# Patient Record
Sex: Female | Born: 1950 | Race: White | Hispanic: No | Smoking: Former smoker
Health system: Southern US, Community
[De-identification: ages and names within clinical notes are randomized; demographics above are authoritative.]

## PROBLEM LIST (undated history)

## (undated) DIAGNOSIS — R7303 Prediabetes: Secondary | ICD-10-CM

## (undated) DIAGNOSIS — F4310 Post-traumatic stress disorder, unspecified: Secondary | ICD-10-CM

## (undated) DIAGNOSIS — F419 Anxiety disorder, unspecified: Secondary | ICD-10-CM

## (undated) DIAGNOSIS — J449 Chronic obstructive pulmonary disease, unspecified: Secondary | ICD-10-CM

## (undated) DIAGNOSIS — M8700 Idiopathic aseptic necrosis of unspecified bone: Secondary | ICD-10-CM

## (undated) DIAGNOSIS — F319 Bipolar disorder, unspecified: Secondary | ICD-10-CM

## (undated) DIAGNOSIS — Z9989 Dependence on other enabling machines and devices: Secondary | ICD-10-CM

## (undated) DIAGNOSIS — F32A Depression, unspecified: Secondary | ICD-10-CM

## (undated) DIAGNOSIS — J189 Pneumonia, unspecified organism: Secondary | ICD-10-CM

## (undated) DIAGNOSIS — M1990 Unspecified osteoarthritis, unspecified site: Secondary | ICD-10-CM

## (undated) DIAGNOSIS — J40 Bronchitis, not specified as acute or chronic: Secondary | ICD-10-CM

## (undated) DIAGNOSIS — R0600 Dyspnea, unspecified: Secondary | ICD-10-CM

## (undated) HISTORY — PX: HAND SURGERY: SHX662

## (undated) HISTORY — PX: TONSILLECTOMY: SUR1361

## (undated) HISTORY — PX: OTHER SURGICAL HISTORY: SHX169

## (undated) HISTORY — PX: COLONOSCOPY: SHX174

## (undated) HISTORY — PX: TONSILLECTOMY AND ADENOIDECTOMY: SHX28

## (undated) HISTORY — PX: CATARACT EXTRACTION: SUR2

## (undated) HISTORY — PX: JOINT REPLACEMENT: SHX530

---

## 2017-04-13 ENCOUNTER — Emergency Department (HOSPITAL_BASED_OUTPATIENT_CLINIC_OR_DEPARTMENT_OTHER): Payer: Medicare Other

## 2017-04-13 ENCOUNTER — Inpatient Hospital Stay (HOSPITAL_BASED_OUTPATIENT_CLINIC_OR_DEPARTMENT_OTHER)
Admission: EM | Admit: 2017-04-13 | Discharge: 2017-04-22 | DRG: 189 | Disposition: A | Discharge status: Home Health | Payer: Medicare Other | Attending: Internal Medicine | Admitting: Internal Medicine

## 2017-04-13 ENCOUNTER — Encounter (HOSPITAL_BASED_OUTPATIENT_CLINIC_OR_DEPARTMENT_OTHER): Payer: Self-pay | Admitting: *Deleted

## 2017-04-13 DIAGNOSIS — J9621 Acute and chronic respiratory failure with hypoxia: Secondary | ICD-10-CM | POA: Diagnosis present

## 2017-04-13 DIAGNOSIS — F419 Anxiety disorder, unspecified: Secondary | ICD-10-CM | POA: Diagnosis present

## 2017-04-13 DIAGNOSIS — B370 Candidal stomatitis: Secondary | ICD-10-CM | POA: Diagnosis not present

## 2017-04-13 DIAGNOSIS — F411 Generalized anxiety disorder: Secondary | ICD-10-CM | POA: Diagnosis present

## 2017-04-13 DIAGNOSIS — F319 Bipolar disorder, unspecified: Secondary | ICD-10-CM | POA: Diagnosis present

## 2017-04-13 DIAGNOSIS — R0902 Hypoxemia: Secondary | ICD-10-CM

## 2017-04-13 DIAGNOSIS — T380X5A Adverse effect of glucocorticoids and synthetic analogues, initial encounter: Secondary | ICD-10-CM | POA: Diagnosis present

## 2017-04-13 DIAGNOSIS — Z79899 Other long term (current) drug therapy: Secondary | ICD-10-CM | POA: Diagnosis not present

## 2017-04-13 DIAGNOSIS — J441 Chronic obstructive pulmonary disease with (acute) exacerbation: Secondary | ICD-10-CM | POA: Diagnosis present

## 2017-04-13 DIAGNOSIS — R739 Hyperglycemia, unspecified: Secondary | ICD-10-CM | POA: Diagnosis present

## 2017-04-13 DIAGNOSIS — F4310 Post-traumatic stress disorder, unspecified: Secondary | ICD-10-CM | POA: Diagnosis present

## 2017-04-13 DIAGNOSIS — D72829 Elevated white blood cell count, unspecified: Secondary | ICD-10-CM | POA: Diagnosis present

## 2017-04-13 DIAGNOSIS — F17210 Nicotine dependence, cigarettes, uncomplicated: Secondary | ICD-10-CM | POA: Diagnosis present

## 2017-04-13 DIAGNOSIS — Z7951 Long term (current) use of inhaled steroids: Secondary | ICD-10-CM

## 2017-04-13 HISTORY — DX: Post-traumatic stress disorder, unspecified: F43.10

## 2017-04-13 HISTORY — DX: Chronic obstructive pulmonary disease, unspecified: J44.9

## 2017-04-13 HISTORY — DX: Bipolar disorder, unspecified: F31.9

## 2017-04-13 LAB — BASIC METABOLIC PANEL
Anion gap: 13 (ref 5–15)
BUN: 16 mg/dL (ref 6–20)
CO2: 22 mmol/L (ref 22–32)
Calcium: 9.5 mg/dL (ref 8.9–10.3)
Chloride: 103 mmol/L (ref 101–111)
Creatinine, Ser: 1.09 mg/dL — ABNORMAL HIGH (ref 0.44–1.00)
GFR calc Af Amer: >60 mL/min (ref >60)
GFR calc non Af Amer: 52 mL/min — ABNORMAL LOW (ref >60)
Glucose, Bld: 146 mg/dL — ABNORMAL HIGH (ref 65–99)
Potassium: 3.9 mmol/L (ref 3.5–5.1)
Sodium: 138 mmol/L (ref 135–145)

## 2017-04-13 LAB — CBC WITH DIFFERENTIAL/PLATELET
Basophils Absolute: 0 10*3/uL (ref 0.0–0.1)
Basophils Relative: 0 %
Eosinophils Absolute: 0 10*3/uL (ref 0.0–0.7)
Eosinophils Relative: 0 %
HCT: 47 % — ABNORMAL HIGH (ref 36.0–46.0)
Hemoglobin: 15.9 g/dL — ABNORMAL HIGH (ref 12.0–15.0)
Lymphocytes Relative: 17 %
Lymphs Abs: 1.5 10*3/uL (ref 0.7–4.0)
MCH: 30.6 pg (ref 26.0–34.0)
MCHC: 33.8 g/dL (ref 30.0–36.0)
MCV: 90.6 fL (ref 78.0–100.0)
Monocytes Absolute: 0.4 10*3/uL (ref 0.1–1.0)
Monocytes Relative: 5 %
Neutro Abs: 7 10*3/uL (ref 1.7–7.7)
Neutrophils Relative %: 78 %
Platelets: 256 10*3/uL (ref 150–400)
RBC: 5.19 MIL/uL — ABNORMAL HIGH (ref 3.87–5.11)
RDW: 13.3 % (ref 11.5–15.5)
WBC: 8.9 10*3/uL (ref 4.0–10.5)

## 2017-04-13 LAB — TROPONIN I: Troponin I: <0.03 ng/mL (ref <0.03)

## 2017-04-13 IMAGING — CR DG CHEST 2V
2 series · 2 of 2 positions shown · non-contrast
Comparison: None.

CLINICAL DATA: Shortness of breath and cough

EXAM:
CHEST  2 VIEW

[w chest pa]
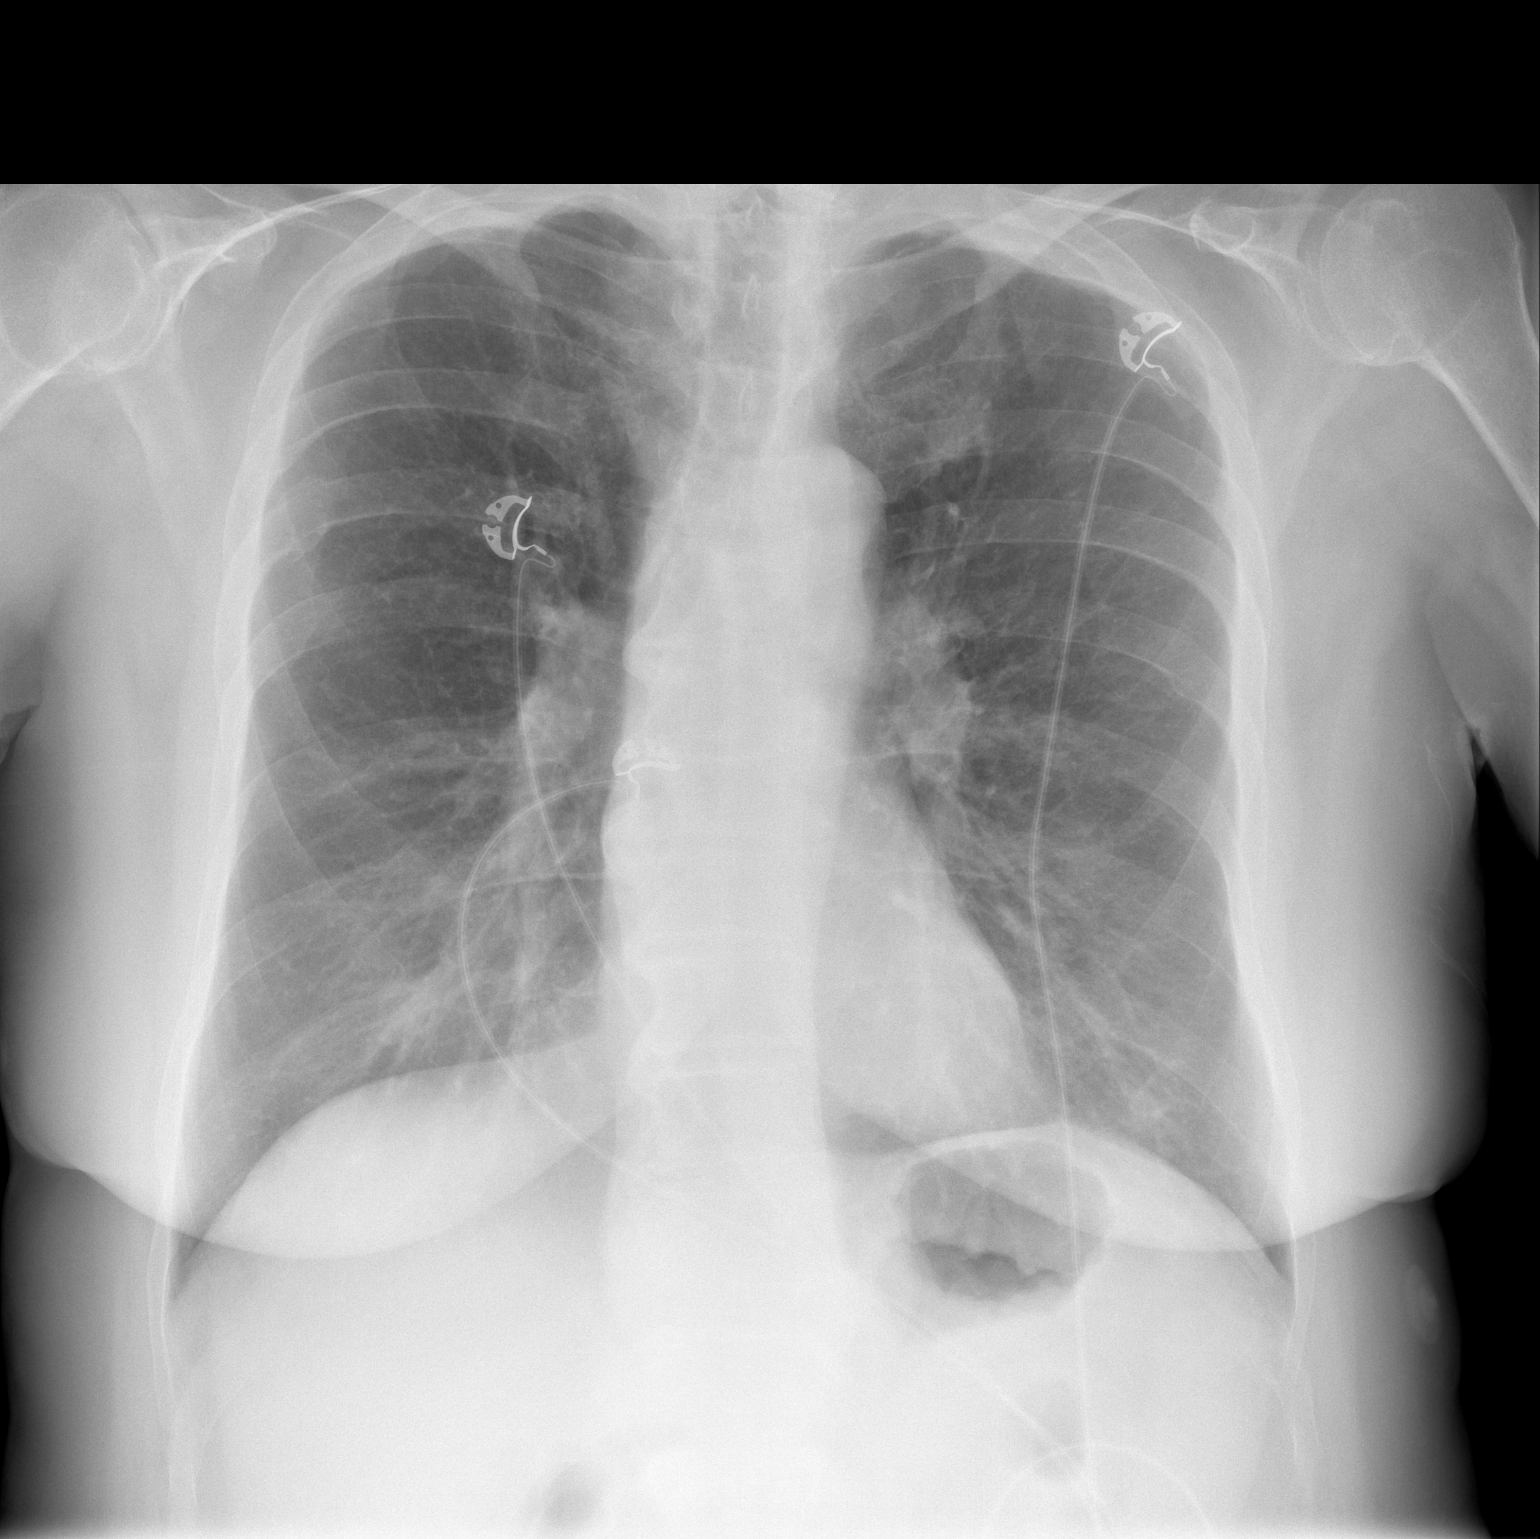

[w chest lat]
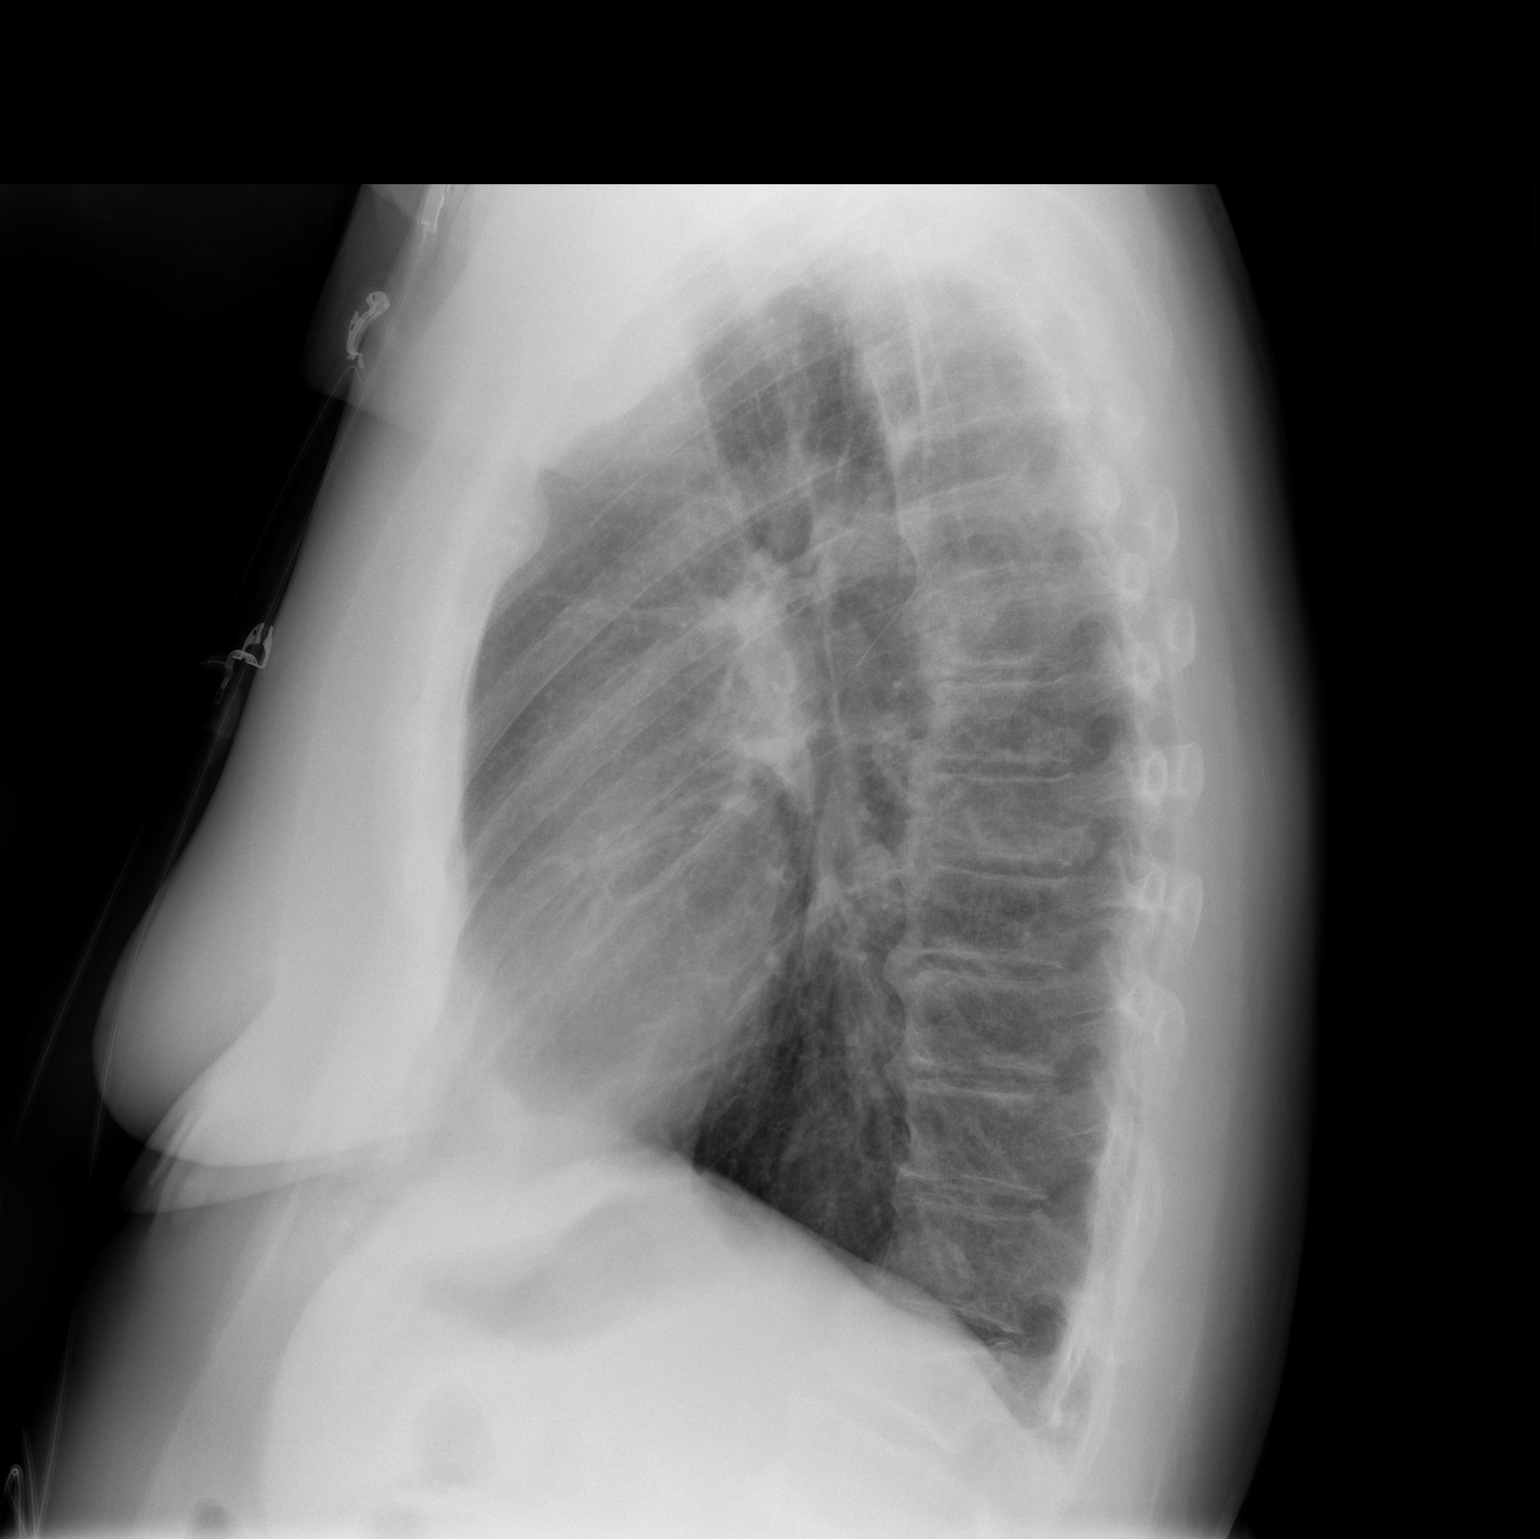

[2 of 2 positions shown; findings below may reference images not displayed]

FINDINGS: There is no edema or consolidation. The heart size and pulmonary
vascularity are normal. No adenopathy. There is degenerative change
in the thoracic spine.
IMPRESSION: No edema or consolidation.

## 2017-04-13 MED ORDER — IPRATROPIUM-ALBUTEROL 0.5-2.5 (3) MG/3ML IN SOLN
3.0000 mL | Freq: Four times a day (QID) | RESPIRATORY_TRACT | Status: DC
  Administered 2017-04-13 – 2017-04-17 (×17): 3 mL via RESPIRATORY_TRACT
  Filled 2017-04-13 (×17): qty 3

## 2017-04-13 MED ORDER — METHYLPREDNISOLONE SODIUM SUCC 125 MG IJ SOLR
125.0000 mg | Freq: Once | INTRAMUSCULAR | Status: AC
  Administered 2017-04-13: 125 mg via INTRAVENOUS
  Filled 2017-04-13: qty 2

## 2017-04-13 MED ORDER — SODIUM CHLORIDE 0.9 % IV BOLUS (SEPSIS)
1000.0000 mL | Freq: Once | INTRAVENOUS | Status: AC
  Administered 2017-04-13: 1000 mL via INTRAVENOUS

## 2017-04-13 MED ORDER — ALBUTEROL SULFATE (2.5 MG/3ML) 0.083% IN NEBU
5.0000 mg | INHALATION_SOLUTION | Freq: Once | RESPIRATORY_TRACT | Status: AC
  Administered 2017-04-13: 5 mg via RESPIRATORY_TRACT
  Filled 2017-04-13: qty 6

## 2017-04-13 MED ORDER — ALBUTEROL (5 MG/ML) CONTINUOUS INHALATION SOLN
10.0000 mg/h | INHALATION_SOLUTION | RESPIRATORY_TRACT | Status: DC
  Administered 2017-04-13: 10 mg/h via RESPIRATORY_TRACT
  Filled 2017-04-13: qty 20

## 2017-04-13 MED ORDER — LORAZEPAM 2 MG/ML IJ SOLN
0.5000 mg | Freq: Once | INTRAMUSCULAR | Status: AC
  Administered 2017-04-13: 0.5 mg via INTRAVENOUS
  Filled 2017-04-13: qty 1

## 2017-04-13 MED ORDER — MAGNESIUM SULFATE 2 GM/50ML IV SOLN
2.0000 g | Freq: Once | INTRAVENOUS | Status: AC
  Administered 2017-04-13: 2 g via INTRAVENOUS
  Filled 2017-04-13: qty 50

## 2017-04-13 MED ORDER — IPRATROPIUM BROMIDE 0.02 % IN SOLN
0.5000 mg | Freq: Once | RESPIRATORY_TRACT | Status: AC
  Administered 2017-04-13: 0.5 mg via RESPIRATORY_TRACT
  Filled 2017-04-13: qty 2.5

## 2017-04-13 MED ORDER — IPRATROPIUM-ALBUTEROL 0.5-2.5 (3) MG/3ML IN SOLN: 3.0000 mL | Freq: Four times a day (QID) | RESPIRATORY_TRACT | Status: DC

## 2017-04-13 NOTE — Progress Notes (Signed)
[facility].  Patient with known h/o COPD and tobacco dependence presenting with COPD exacerbation with failure of outpatient antibiotics (Augmentin and prednisone).  They are requesting admission to SDU.   Home pulse ox measured 78%; 87% on room air on presentation with accessory muscle use and increased WOB.  Feeling some better but placed on BIPAP with increased WOB and anxiety with good response.  EKG with sinus tach, CXR negative.  Will admit to SDU and continue BIPAP for now.  Georgana CurioJennifer E. Meiko Ives, M.D.

## 2017-04-13 NOTE — ED Notes (Signed)
Pt on cardiac monitor and auto VS 

## 2017-04-13 NOTE — ED Notes (Signed)
Pt able to stand and pivot from wheelchair to lowered bed x1 min assist. RT and RN at bedside.

## 2017-04-13 NOTE — ED Triage Notes (Signed)
Patient states she has a five history of SOB.  Has been seen by her PCP three days ago and was treated Albuterol inhaler, prednisone and amoxicillin for an Exacerbation of COPD.   States she has only gotten worse and today is the worse day so far.  Pulse ox at home was in the 80"s.

## 2017-04-13 NOTE — ED Provider Notes (Signed)
[facility] Provider Note   CSN: [id number] Arrival date & time: 04/13/17  1644  By signing my name below, I, Judy [name], attest that this documentation has been prepared under the direction and in the presence of Judy Levandowski, PA-C. Electronically Signed: Linna [name], Scribe. 04/13/2017. 5:24 PM.  History   Chief Complaint Chief Complaint  Patient presents with  . Shortness of Breath   The history is provided by the patient. No language interpreter was used.    HPI Comments: Judy [name] is a 66 y.o. female with PMHx of COPD who presents to the Emergency Department complaining of persistent, gradually worsening dyspnea for five days. She notes some associated subjective fevers and also notes an intermittent dry cough for one week. Patient was evaluated by her PCP three days ago for the same and was prescribed prednisone, amoxicillin, and an albuterol inhaler. She has been using the inhaler every 4-6 hours without relief. She did not have a chest x-ray taken during the evaluation with her PCP. Patient also has albuterol nebulizer treatments at home but has instead been using the inhalers over the last three days. She notes that her oxygen saturation was in the 80's prior to arrival today. Patient does report that her breathing has improved here with No noted cardiac history. Patient denies chest pain or any other associated symptoms.  Past Medical History:  Diagnosis Date  . Bipolar 1 disorder (HCC)   . COPD (chronic obstructive pulmonary disease) (HCC)     There are no active problems to display for this patient.   Past Surgical History:  Procedure Laterality Date  . bone spurs foot      OB History    No data available       Home Medications    Prior to Admission medications   Medication Sig Start Date End Date Taking? Authorizing Provider  ALPRAZolam Prudy Feeler) 0.5 MG tablet Take 0.5 mg by mouth 2 (two) times daily.   Yes [provider]    amoxicillin-clavulanate (AUGMENTIN) 875-125 MG tablet Take 1 tablet by mouth 2 (two) times daily.   Yes [provider]  atorvastatin (LIPITOR) 40 MG tablet Take 40 mg by mouth daily.   Yes [provider]  benztropine (COGENTIN) 2 MG tablet Take 2 mg by mouth 2 (two) times daily.   Yes [provider]  eszopiclone (LUNESTA) 2 MG TABS tablet Take 3 mg by mouth at bedtime as needed for sleep. Take immediately before bedtime   Yes [provider]  ibuprofen (ADVIL,MOTRIN) 800 MG tablet Take 800 mg by mouth every 8 (eight) hours as needed.   Yes [provider]  lurasidone (LATUDA) 80 MG TABS tablet Take 80 mg by mouth daily with breakfast.   Yes [provider]  PREDNISONE PO Take by mouth.   Yes [provider]  Vilazodone HCl (VIIBRYD) 40 MG TABS Take by mouth daily.   Yes [provider]    Family History No family history on file.  Social History Social History  Substance Use Topics  . Smoking status: Current Every Day Smoker    Packs/day: 0.50    Types: Cigarettes  . Smokeless tobacco: Never Used  . Alcohol use Yes     Comment: occassionaly     Allergies   Patient has no known allergies.   Review of Systems Review of Systems  Constitutional: Positive for fever (subjective).  Respiratory: Positive for cough and shortness of breath.   Cardiovascular: Negative for chest  pain.   Physical Exam Updated Vital Signs BP (!) 137/91 (BP Location: Left Arm)   Pulse (!) 127   Temp 99.1 F (37.3 C) (Oral)   Resp (!) 28   Ht 5\' 10"  (1.778 m)   Wt 189 lb (85.7 kg)   SpO2 (!) 87%   BMI 27.12 kg/m   Physical Exam  Constitutional: She is oriented to person, place, and time. She appears well-developed and well-nourished. No distress.  HENT:  Head: Normocephalic and atraumatic.  Eyes: Conjunctivae and EOM are normal.  Neck: Neck supple. No tracheal deviation present.  Cardiovascular: Regular rhythm and  normal heart sounds.   tachycardic  Pulmonary/Chest: She has wheezes.  Inspiratory and expiratory wheezing bilaterally. Decreased air movement. Tacheipnea. Accessory muscle use. Speaking 3 word sentences.   Musculoskeletal: Normal range of motion.  Neurological: She is alert and oriented to person, place, and time.  Skin: Skin is warm and dry.  Psychiatric: She has a normal mood and affect. Her behavior is normal.  Nursing note and vitals reviewed.  ED Treatments / Results  Labs (all labs ordered are listed, but only abnormal results are displayed) Labs Reviewed  CBC WITH DIFFERENTIAL/PLATELET  BASIC METABOLIC PANEL  TROPONIN I    EKG  EKG Interpretation None       Radiology No results found.  Procedures Procedures (including critical care time)  DIAGNOSTIC STUDIES: Oxygen Saturation is 87% on RA, low by my interpretation.    COORDINATION OF CARE: 5:14 PM Discussed treatment plan with pt at bedside and pt agreed to plan.  Medications Ordered in ED Medications  methylPREDNISolone sodium succinate (SOLU-MEDROL) 125 mg/2 mL injection 125 mg (not administered)  ipratropium-albuterol (DUONEB) 0.5-2.5 (3) MG/3ML nebulizer solution 3 mL (not administered)  sodium chloride 0.9 % bolus 1,000 mL (not administered)  albuterol (PROVENTIL) (2.5 MG/3ML) 0.083% nebulizer solution 5 mg (5 mg Nebulization Given 04/13/17 1710)  ipratropium (ATROVENT) nebulizer solution 0.5 mg (0.5 mg Nebulization Given 04/13/17 1709)     Initial Impression / Assessment and Plan / ED Course  I have reviewed the triage vital signs and the nursing notes.  Pertinent labs & imaging results that were available during my care of the patient were reviewed by me and considered in my medical decision making (see chart for details).    Patient emergency department with cough, shortness of breath, wheezing. Started on Augmentin, prednisone, albuterol inhaler 3 days ago by PCP, worsening. Patient states her oxygen  saturation at home was 78%. Here oxygen saturation to 87 on room air. Started on IV breathing treatment. Patient is wheezing. Will administer Solu-Medrol, get labs and chest x-ray.  6:08 PM Patient states she feels slightly better, however continues to have wheezing and increased respiratory rate with some accessory muscle use. Placed on continuous neb. Will add magnesium.  8:30 PM Pt very anxious, continues to work hard breathing. Discussed with Dr. Particia NearingHaviland, will start on bipap. Ativan given for anxiety. Had to stop neb bc pt was very jittery and shaking, very anxious. Will admit Spoke with [facility], accepted to step down, Dr. Ophelia CharterYates. Pt is doing better on bipap. BP remaining normal. HR remaining slightly elevated. Oxygen in high 90s on bipap.    10:17 PM [facility] here to transport pt. We did tried to get her off bipap but she was unable to tolerate. Placed back on bipap. Will be transproted to [facility].   Vitals:   04/13/17 2000 04/13/17 2030 04/13/17 2035 04/13/17 2100  BP: (!) 146/98 (!) 145/84  131/84  Pulse: (!) 103 (!) 102  (!) 109  Resp: 17 18  (!) 23  Temp:      TempSrc:      SpO2: 97% 96% 95% 95%  Weight:      Height:          Final Clinical Impressions(s) / ED Diagnoses   Final diagnoses:  COPD exacerbation (HCC)    New Prescriptions New Prescriptions   No medications on file   I personally performed the services described in this documentation, which was scribed in my presence. The recorded information has been reviewed and is accurate.    Jaynie Crumble, PA-C 04/13/17 2219    Jacalyn Lefevre, MD 04/13/17 2311

## 2017-04-14 ENCOUNTER — Encounter (HOSPITAL_COMMUNITY): Payer: Self-pay | Admitting: Internal Medicine

## 2017-04-14 DIAGNOSIS — R739 Hyperglycemia, unspecified: Secondary | ICD-10-CM

## 2017-04-14 DIAGNOSIS — J441 Chronic obstructive pulmonary disease with (acute) exacerbation: Secondary | ICD-10-CM | POA: Diagnosis present

## 2017-04-14 LAB — CBC
HCT: 39.1 % (ref 36.0–46.0)
Hemoglobin: 13.1 g/dL (ref 12.0–15.0)
MCH: 30.1 pg (ref 26.0–34.0)
MCHC: 33.5 g/dL (ref 30.0–36.0)
MCV: 89.9 fL (ref 78.0–100.0)
Platelets: 203 10*3/uL (ref 150–400)
RBC: 4.35 MIL/uL (ref 3.87–5.11)
RDW: 13.2 % (ref 11.5–15.5)
WBC: 12 10*3/uL — ABNORMAL HIGH (ref 4.0–10.5)

## 2017-04-14 LAB — BASIC METABOLIC PANEL
Anion gap: 13 (ref 5–15)
BUN: 17 mg/dL (ref 6–20)
CO2: 22 mmol/L (ref 22–32)
Calcium: 9.3 mg/dL (ref 8.9–10.3)
Chloride: 104 mmol/L (ref 101–111)
Creatinine, Ser: 1.06 mg/dL — ABNORMAL HIGH (ref 0.44–1.00)
GFR calc Af Amer: >60 mL/min (ref >60)
GFR calc non Af Amer: 54 mL/min — ABNORMAL LOW (ref >60)
Glucose, Bld: 219 mg/dL — ABNORMAL HIGH (ref 65–99)
Potassium: 4 mmol/L (ref 3.5–5.1)
Sodium: 139 mmol/L (ref 135–145)

## 2017-04-14 LAB — GLUCOSE, CAPILLARY
Glucose-Capillary: 172 mg/dL — ABNORMAL HIGH (ref 65–99)
Glucose-Capillary: 179 mg/dL — ABNORMAL HIGH (ref 65–99)
Glucose-Capillary: 194 mg/dL — ABNORMAL HIGH (ref 65–99)
Glucose-Capillary: 168 mg/dL — ABNORMAL HIGH (ref 65–99)

## 2017-04-14 LAB — MRSA PCR SCREENING: MRSA by PCR: NEGATIVE

## 2017-04-14 LAB — HIV ANTIBODY (ROUTINE TESTING W REFLEX): HIV Screen 4th Generation wRfx: NONREACTIVE

## 2017-04-14 MED ORDER — INSULIN ASPART 100 UNIT/ML ~~LOC~~ SOLN
0.0000 [IU] | Freq: Three times a day (TID) | SUBCUTANEOUS | Status: DC
  Administered 2017-04-14 (×3): 3 [IU] via SUBCUTANEOUS
  Administered 2017-04-15: 2 [IU] via SUBCUTANEOUS
  Administered 2017-04-15 – 2017-04-16 (×3): 3 [IU] via SUBCUTANEOUS
  Administered 2017-04-16: 2 [IU] via SUBCUTANEOUS
  Administered 2017-04-16: 5 [IU] via SUBCUTANEOUS
  Administered 2017-04-17: 3 [IU] via SUBCUTANEOUS
  Administered 2017-04-17 (×2): 2 [IU] via SUBCUTANEOUS
  Administered 2017-04-18 (×2): 3 [IU] via SUBCUTANEOUS
  Administered 2017-04-18: 2 [IU] via SUBCUTANEOUS
  Administered 2017-04-19 (×2): 3 [IU] via SUBCUTANEOUS
  Administered 2017-04-19: 5 [IU] via SUBCUTANEOUS
  Administered 2017-04-20: 11 [IU] via SUBCUTANEOUS
  Administered 2017-04-20 (×2): 3 [IU] via SUBCUTANEOUS
  Administered 2017-04-21: 2 [IU] via SUBCUTANEOUS
  Administered 2017-04-21 (×2): 3 [IU] via SUBCUTANEOUS
  Administered 2017-04-22: 2 [IU] via SUBCUTANEOUS

## 2017-04-14 MED ORDER — ALPRAZOLAM 0.5 MG PO TABS
0.5000 mg | ORAL_TABLET | Freq: Two times a day (BID) | ORAL | Status: DC | PRN
  Administered 2017-04-14 – 2017-04-16 (×3): 0.5 mg via ORAL
  Filled 2017-04-14 (×3): qty 1

## 2017-04-14 MED ORDER — ENOXAPARIN SODIUM 40 MG/0.4ML ~~LOC~~ SOLN
40.0000 mg | Freq: Every day | SUBCUTANEOUS | Status: DC
  Administered 2017-04-14 – 2017-04-21 (×7): 40 mg via SUBCUTANEOUS
  Filled 2017-04-14 (×9): qty 0.4

## 2017-04-14 MED ORDER — CHLORHEXIDINE GLUCONATE 0.12 % MT SOLN: 15.0000 mL | Freq: Two times a day (BID) | OROMUCOSAL | Status: DC

## 2017-04-14 MED ORDER — METHYLPREDNISOLONE SODIUM SUCC 125 MG IJ SOLR
80.0000 mg | Freq: Two times a day (BID) | INTRAMUSCULAR | Status: DC
  Administered 2017-04-14 – 2017-04-17 (×8): 80 mg via INTRAVENOUS
  Filled 2017-04-14 (×9): qty 2

## 2017-04-14 MED ORDER — BENZTROPINE MESYLATE 0.5 MG PO TABS
2.0000 mg | ORAL_TABLET | Freq: Two times a day (BID) | ORAL | Status: DC
  Administered 2017-04-14 – 2017-04-22 (×18): 2 mg via ORAL
  Filled 2017-04-14 (×18): qty 4

## 2017-04-14 MED ORDER — LURASIDONE HCL 40 MG PO TABS
80.0000 mg | ORAL_TABLET | Freq: Every day | ORAL | Status: DC
  Administered 2017-04-14 – 2017-04-21 (×9): 80 mg via ORAL
  Filled 2017-04-14 (×9): qty 2

## 2017-04-14 MED ORDER — ONDANSETRON HCL 4 MG PO TABS: 4.0000 mg | ORAL_TABLET | Freq: Four times a day (QID) | ORAL | Status: DC | PRN

## 2017-04-14 MED ORDER — ALBUTEROL SULFATE (2.5 MG/3ML) 0.083% IN NEBU
2.5000 mg | INHALATION_SOLUTION | RESPIRATORY_TRACT | Status: DC | PRN
Start: 2017-04-14 — End: 2017-04-22

## 2017-04-14 MED ORDER — ORAL CARE MOUTH RINSE
15.0000 mL | Freq: Two times a day (BID) | OROMUCOSAL | Status: DC
  Administered 2017-04-14 – 2017-04-16 (×4): 15 mL via OROMUCOSAL

## 2017-04-14 MED ORDER — LACTATED RINGERS IV SOLN
INTRAVENOUS | Status: DC
  Administered 2017-04-14 – 2017-04-15 (×4): via INTRAVENOUS

## 2017-04-14 MED ORDER — ACETAMINOPHEN 650 MG RE SUPP: 650.0000 mg | Freq: Four times a day (QID) | RECTAL | Status: DC | PRN

## 2017-04-14 MED ORDER — VILAZODONE HCL 40 MG PO TABS
40.0000 mg | ORAL_TABLET | Freq: Every day | ORAL | Status: DC
  Administered 2017-04-14 – 2017-04-15 (×2): 40 mg via ORAL
  Filled 2017-04-14 (×5): qty 1

## 2017-04-14 MED ORDER — DOCUSATE SODIUM 100 MG PO CAPS
100.0000 mg | ORAL_CAPSULE | Freq: Two times a day (BID) | ORAL | Status: DC
  Administered 2017-04-14 – 2017-04-21 (×13): 100 mg via ORAL
  Filled 2017-04-14 (×14): qty 1

## 2017-04-14 MED ORDER — SODIUM CHLORIDE 0.9% FLUSH
3.0000 mL | Freq: Two times a day (BID) | INTRAVENOUS | Status: DC
  Administered 2017-04-14 (×2): 3 mL via INTRAVENOUS
  Administered 2017-04-15: 10 mL via INTRAVENOUS
  Administered 2017-04-16 – 2017-04-19 (×6): 3 mL via INTRAVENOUS

## 2017-04-14 MED ORDER — CYCLOBENZAPRINE HCL 10 MG PO TABS
10.0000 mg | ORAL_TABLET | Freq: Three times a day (TID) | ORAL | Status: DC | PRN
  Administered 2017-04-21: 10 mg via ORAL
  Filled 2017-04-14: qty 1

## 2017-04-14 MED ORDER — ZOLPIDEM TARTRATE 5 MG PO TABS
5.0000 mg | ORAL_TABLET | Freq: Every day | ORAL | Status: DC
  Administered 2017-04-14 – 2017-04-21 (×9): 5 mg via ORAL
  Filled 2017-04-14 (×9): qty 1

## 2017-04-14 MED ORDER — ATORVASTATIN CALCIUM 40 MG PO TABS
40.0000 mg | ORAL_TABLET | Freq: Every day | ORAL | Status: DC
  Administered 2017-04-14 – 2017-04-21 (×9): 40 mg via ORAL
  Filled 2017-04-14 (×9): qty 1

## 2017-04-14 MED ORDER — DOXYCYCLINE HYCLATE 100 MG IV SOLR
100.0000 mg | Freq: Two times a day (BID) | INTRAVENOUS | Status: DC
  Administered 2017-04-14 – 2017-04-17 (×8): 100 mg via INTRAVENOUS
  Filled 2017-04-14 (×9): qty 100

## 2017-04-14 MED ORDER — ONDANSETRON HCL 4 MG/2ML IJ SOLN: 4.0000 mg | Freq: Four times a day (QID) | INTRAMUSCULAR | Status: DC | PRN

## 2017-04-14 MED ORDER — ACETAMINOPHEN 325 MG PO TABS
650.0000 mg | ORAL_TABLET | Freq: Four times a day (QID) | ORAL | Status: DC | PRN
  Administered 2017-04-14 – 2017-04-16 (×4): 650 mg via ORAL
  Filled 2017-04-14 (×4): qty 2

## 2017-04-14 MED ORDER — GABAPENTIN 300 MG PO CAPS
300.0000 mg | ORAL_CAPSULE | Freq: Every day | ORAL | Status: DC
  Administered 2017-04-14 – 2017-04-21 (×9): 300 mg via ORAL
  Filled 2017-04-14 (×9): qty 1

## 2017-04-14 NOTE — Progress Notes (Signed)
PROGRESS NOTE    Judy [name]  EXB:[id number] DOB: 06-04-1951 DOA: 04/13/2017 PCP: Doyle Askew, PA    Brief Narrative:  66 y.o. 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  Coughing since Tuesday evening. Nonproductive.  No h/o BIPAP - she does not feel like it is helping her to breathe.  She would like to try being off it.  Low-grade fever recently.   She was seen by her PCP earlier this week and given Augmentin and Prednisone and so has failed outpatient management.   ED Course: Low O2 sats on presentation with acute dyspnea and wheezing.  Given neb and solumedrol and started on BIPAP with improvement.  Assessment & Plan:   Principal Problem:   Acute on chronic respiratory failure with hypoxia (HCC) Active Problems:   COPD with acute exacerbation (HCC)   Hyperglycemia  Acute on chronic respiratory failure with hypoxia due to acute COPD exacerbation -Patient continue with an extremely wheezing with increased respiratory effort -Patient reportedly failed outpatient course of Augmentin and prednisone -Patient initially had required BiPAP at time of admission, continued on nasal cannula presently. Continue to wean O2 as tolerated -Patient continued on IV Solu-Medrol and scheduled breathing treatments -Continue empiric doxycycline  Hyperglycemia -Glucose 146; 105 in 10/16 -Patient is continued on sliding scale insulin given concurrent high-dose steroids  Acute hypoxemic respiratory failure -Secondary to above  Leukocytosis -Suspect secondary to IV steroids -Patient afebrile  DVT prophylaxis: Lovenox subcutaneous Code Status: Full code Family Communication: Patient in room, family not at bedside Disposition Plan: Uncertain at this time  Consultants:     Procedures:     Antimicrobials: Anti-infectives    Start     Dose/Rate Route Frequency Ordered Stop   04/14/17 0145  doxycycline (VIBRAMYCIN) 100 mg in  dextrose 5 % 250 mL IVPB     100 mg 125 mL/hr over 120 Minutes Intravenous 2 times daily 04/14/17 0125         Subjective: Reports feeling better, still short of breath and wheezing  Objective: Vitals:   04/14/17 0737 04/14/17 0800 04/14/17 0900 04/14/17 1000  BP:   (!) 173/78 (!) 147/69  Pulse:   (!) 105 87  Resp:   (!) 28 (!) 24  Temp:  (!) 97.3 F (36.3 C)    TempSrc:  Oral    SpO2: 95%  90% 94%  Weight:      Height:        Intake/Output Summary (Last 24 hours) at 04/14/17 1218 Last data filed at 04/14/17 1100  Gross per 24 hour  Intake          1534.25 ml  Output              575 ml  Net           959.25 ml   Filed Weights   04/13/17 1656  Weight: 85.7 kg (189 lb)    Examination:  General exam: Appears calm and comfortable  Respiratory system: Increased respiratory effort, end expiratory wheezing auscultated throughout, increased expiratory and respiratory ratio Cardiovascular system: S1 & S2 heard, RRR Gastrointestinal system: Abdomen is nondistended, soft and nontender. No organomegaly or masses felt. Normal bowel sounds heard. Central nervous system: Alert and oriented. No focal neurological deficits. Extremities: Symmetric 5 x 5 power. Skin: No rashes, lesions or ulcers Psychiatry: Judgement and insight appear normal. Mood & affect appropriate.   Data Reviewed: I have personally reviewed following labs and imaging studies  CBC:  Recent Labs Lab 04/13/17 1715 04/14/17 0325  WBC 8.9 12.0*  NEUTROABS 7.0  --   HGB 15.9* 13.1  HCT 47.0* 39.1  MCV 90.6 89.9  PLT 256 203   Basic Metabolic Panel:  Recent Labs Lab 04/13/17 1715 04/14/17 0325  NA 138 139  K 3.9 4.0  CL 103 104  CO2 22 22  GLUCOSE 146* 219*  BUN 16 17  CREATININE 1.09* 1.06*  CALCIUM 9.5 9.3   GFR: Estimated Creatinine Clearance: 63 mL/min (A) (by C-G formula based on SCr of 1.06 mg/dL (H)). Liver Function Tests: No results for input(s): AST, ALT, ALKPHOS, BILITOT, PROT,  ALBUMIN in the last 168 hours. No results for input(s): LIPASE, AMYLASE in the last 168 hours. No results for input(s): AMMONIA in the last 168 hours. Coagulation Profile: No results for input(s): INR, PROTIME in the last 168 hours. Cardiac Enzymes:  Recent Labs Lab 04/13/17 1713  TROPONINI <0.03   BNP (last 3 results) No results for input(s): PROBNP in the last 8760 hours. HbA1C: No results for input(s): HGBA1C in the last 72 hours. CBG:  Recent Labs Lab 04/14/17 0841 04/14/17 1143  GLUCAP 172* 179*   Lipid Profile: No results for input(s): CHOL, HDL, LDLCALC, TRIG, CHOLHDL, LDLDIRECT in the last 72 hours. Thyroid Function Tests: No results for input(s): TSH, T4TOTAL, FREET4, T3FREE, THYROIDAB in the last 72 hours. Anemia Panel: No results for input(s): VITAMINB12, FOLATE, FERRITIN, TIBC, IRON, RETICCTPCT in the last 72 hours. Sepsis Labs: No results for input(s): PROCALCITON, LATICACIDVEN in the last 168 hours.  Recent Results (from the past 240 hour(s))  MRSA PCR Screening     Status: None   Collection Time: 04/13/17 10:54 PM  Result Value Ref Range Status   MRSA by PCR NEGATIVE NEGATIVE Final    Comment:        The GeneXpert MRSA Assay (FDA approved for NASAL specimens only), is one component of a comprehensive MRSA colonization surveillance program. It is not intended to diagnose MRSA infection nor to guide or monitor treatment for MRSA infections.      Radiology Studies: Dg Chest 2 View  Result Date: 04/13/2017 CLINICAL DATA:  Shortness of breath and cough EXAM: CHEST  2 VIEW COMPARISON:  None. FINDINGS: There is no edema or consolidation. The heart size and pulmonary vascularity are normal. No adenopathy. There is degenerative change in the thoracic spine. IMPRESSION: No edema or consolidation. Electronically Signed   By: Bretta Bang III M.D.   On: 04/13/2017 17:48    Scheduled Meds: . atorvastatin  40 mg Oral QHS  . benztropine  2 mg Oral BID    . docusate sodium  100 mg Oral BID  . enoxaparin (LOVENOX) injection  40 mg Subcutaneous QHS  . gabapentin  300 mg Oral QHS  . insulin aspart  0-15 Units Subcutaneous TID WC  . ipratropium-albuterol  3 mL Nebulization QID  . lurasidone  80 mg Oral QHS  . mouth rinse  15 mL Mouth Rinse q12n4p  . methylPREDNISolone (SOLU-MEDROL) injection  80 mg Intravenous BID  . sodium chloride flush  3 mL Intravenous Q12H  . Vilazodone HCl  40 mg Oral Daily  . zolpidem  5 mg Oral QHS   Continuous Infusions: . doxycycline (VIBRAMYCIN) IV Stopped (04/14/17 1138)  . lactated ringers 75 mL/hr at 04/14/17 0700     LOS: 1 day   CHIU, Scheryl Marten, MD [facility] Pager [phone]  If 7PM-7AM, please contact night-coverage [email or web address] Password [email or web address]  04/14/2017, 12:18 PM

## 2017-04-14 NOTE — Progress Notes (Signed)
CSW consulted for COPD GOLD protocol. Past admissions reviewed. Pt does not meet criteria for COPD GOLD protocol.   CSW signing off.  Solene Hereford LCSW [phone] 

## 2017-04-14 NOTE — Progress Notes (Signed)
Initial Nutrition Assessment  INTERVENTION:   Provide Ensure Enlive po BID, each supplement provides 350 kcal and 20 grams of protein Encourage PO intake RD to continue to monitor  NUTRITION DIAGNOSIS:   Increased nutrient needs related to  (COPD) as evidenced by estimated needs.  GOAL:   Patient will meet greater than or equal to 90% of their needs  MONITOR:   PO intake, Supplement acceptance, Labs, Weight trends, I & O's  REASON FOR ASSESSMENT:   Consult COPD Protocol  ASSESSMENT:   66 y.o. 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  Coughing since Tuesday evening. Nonproductive.  No h/o BIPAP - she does not feel like it is helping her to breathe.  She would like to try being off it.  Low-grade fever recently.   She was seen by her PCP earlier this week and given Augmentin and Prednisone and so has failed outpatient management.  Pt in room with daughter at bedside. Pt reports not eating anything since Tuesday, 7/3 d/t SOB. Pt ordered an omelet, potatoes and sausage this morning but did not eat any of it. States the omelet was too dry and she could not swallow it. Pt is willing to try protein supplements while she is not eating well. RD will order Ensure.  Pt reports no weight loss. Pt with no depletion except for mildly in patellar region.  Medications: Colace capsule BID, Lactated Ringers infusion at 75 ml/hr  Labs reviewed: CBGs: 172  Diet Order:  Diet regular Room service appropriate? Yes; Fluid consistency: Thin  Skin:  Reviewed, no issues  Last BM:  PTA  Height:   Ht Readings from Last 1 Encounters:  04/13/17 5\' 10"  (1.778 m)    Weight:   Wt Readings from Last 1 Encounters:  04/13/17 189 lb (85.7 kg)    Ideal Body Weight:  68.2 kg  BMI:  Body mass index is 27.12 kg/m.  Estimated Nutritional Needs:   Kcal:  2000-2200  Protein:  100-110g  Fluid:  2L/day  EDUCATION NEEDS:   Education needs  addressed  Tilda FrancoLindsey Evangaline Jou, MS, RD, LDN Pager: [phone] After Hours Pager: [phone]

## 2017-04-14 NOTE — H&P (Signed)
History and Physical    Judy [name] ZOX:[id number] DOB: November 18, 1950 DOA: 04/13/2017  PCP: Doyle Askew, PA Consultants:  psychiatry Patient coming from: Home - lives alone; NOK: daughter, [phone]  Chief Complaint: SOB  HPI: Judy [name] is a 66 y.o. 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  Coughing since Tuesday evening. Nonproductive.  No h/o BIPAP - she does not feel like it is helping her to breathe.  She would like to try being off it.  Low-grade fever recently.   She was seen by her PCP earlier this week and given Augmentin and Prednisone and so has failed outpatient management.   ED Course: Low O2 sats on presentation with acute dyspnea and wheezing.  Given neb and solumedrol and started on BIPAP with improvement.  Review of Systems: As per HPI; otherwise review of systems reviewed and negative.   Ambulatory Status:  Ambulates witohut assistance  Past Medical History:  Diagnosis Date  . Bipolar 1 disorder (HCC)   . COPD (chronic obstructive pulmonary disease) (HCC)   . PTSD (post-traumatic stress disorder)     Past Surgical History:  Procedure Laterality Date  . APPENDECTOMY    . bone spurs foot    . HAND SURGERY Left     Social History   Social History  . Marital status: Married    Spouse name: N/A  . Number of children: N/A  . Years of education: N/A   Occupational History  . retired    Social History Main Topics  . Smoking status: Current Every Day Smoker    Packs/day: 0.50    Years: 43.00    Types: Cigarettes  . Smokeless tobacco: Never Used  . Alcohol use Yes     Comment: occassionaly  . Drug use: No  . Sexual activity: Not on file   Other Topics Concern  . Not on file   Social History Narrative  . No narrative on file    No Known Allergies  Family History  Problem Relation Age of Onset  . CAD Mother 76  . CAD Father     Prior to Admission medications   Medication Sig  Start Date End Date Taking? Authorizing Provider  albuterol (PROVENTIL HFA;VENTOLIN HFA) 108 (90 Base) MCG/ACT inhaler Inhale 2 puffs into the lungs every 6 (six) hours as needed for wheezing or shortness of breath.   Yes [provider]  ALPRAZolam Prudy Feeler) 0.5 MG tablet Take 0.5 mg by mouth 2 (two) times daily as needed for anxiety.    Yes [provider]  amoxicillin-clavulanate (AUGMENTIN) 875-125 MG tablet Take 1 tablet by mouth 2 (two) times daily. 04/10/17 04/17/17 Yes [provider]  atorvastatin (LIPITOR) 40 MG tablet Take 40 mg by mouth at bedtime.    Yes [provider]  benztropine (COGENTIN) 2 MG tablet Take 2 mg by mouth 2 (two) times daily.   Yes [provider]  cyclobenzaprine (FLEXERIL) 10 MG tablet Take 10 mg by mouth 3 (three) times daily as needed for muscle spasms.   Yes [provider]  Eszopiclone (ESZOPICLONE) 3 MG TABS Take 3 mg by mouth at bedtime. Take immediately before bedtime   Yes [provider]  gabapentin (NEURONTIN) 300 MG capsule Take 300 mg by mouth at bedtime.   Yes [provider]  ibuprofen (ADVIL,MOTRIN) 200 MG tablet Take 800 mg by mouth every 6 (six) hours as needed for mild pain or moderate pain.   Yes [provider]  lurasidone (LATUDA) 80 MG TABS tablet Take 80 mg by mouth at bedtime.    Yes [provider]  predniSONE (DELTASONE) 20 MG tablet Take 20 mg by mouth 2 (two) times daily with a meal. 04/10/17 04/15/17 Yes [provider]  Vilazodone HCl (VIIBRYD) 40 MG TABS Take 40 mg by mouth daily.    Yes [provider]    Physical Exam: Vitals:   04/13/17 2331 04/14/17 0000 04/14/17 0100 04/14/17 0118  BP: (!) 157/86 (!) 164/80 132/76   Pulse: (!) 105 (!) 104 87 90  Resp: (!) 24 (!) 22 19 16   Temp:      TempSrc:      SpO2: 98% 95% 93% 93%  Weight:      Height:         General: Appears calm and comfortable and is NAD Eyes:  PERRL, EOMI,  normal lids, iris ENT:  grossly normal hearing, lips & tongue, mmm Neck:  no LAD, masses or thyromegaly Cardiovascular:  RRR, no m/r/g. No LE edema.  Respiratory:  Mild diffuse wheezing with reasonable air movement on BIPAP. Normal respiratory effort. Abdomen:  soft, ntnd, NABS Skin:  no rash or induration seen on limited exam Musculoskeletal:  grossly normal tone BUE/BLE, good ROM, no bony abnormality Psychiatric: Slightly anxious mood and affect, speech fluent and appropriate, AOx3 Neurologic:  CN 2-12 grossly intact, moves all extremities in coordinated fashion, sensation intact  Labs on Admission: I have personally reviewed following labs and imaging studies  CBC:  Recent Labs Lab 04/13/17 1715  WBC 8.9  NEUTROABS 7.0  HGB 15.9*  HCT 47.0*  MCV 90.6  PLT 256   Basic Metabolic Panel:  Recent Labs Lab 04/13/17 1715  NA 138  K 3.9  CL 103  CO2 22  GLUCOSE 146*  BUN 16  CREATININE 1.09*  CALCIUM 9.5   GFR: Estimated Creatinine Clearance: 61.2 mL/min (A) (by C-G formula based on SCr of 1.09 mg/dL (H)). Liver Function Tests: No results for input(s): AST, ALT, ALKPHOS, BILITOT, PROT, ALBUMIN in the last 168 hours. No results for input(s): LIPASE, AMYLASE in the last 168 hours. No results for input(s): AMMONIA in the last 168 hours. Coagulation Profile: No results for input(s): INR, PROTIME in the last 168 hours. Cardiac Enzymes:  Recent Labs Lab 04/13/17 1713  TROPONINI <0.03   BNP (last 3 results) No results for input(s): PROBNP in the last 8760 hours. HbA1C: No results for input(s): HGBA1C in the last 72 hours. CBG: No results for input(s): GLUCAP in the last 168 hours. Lipid Profile: No results for input(s): CHOL, HDL, LDLCALC, TRIG, CHOLHDL, LDLDIRECT in the last 72 hours. Thyroid Function Tests: No results for input(s): TSH, T4TOTAL, FREET4, T3FREE, THYROIDAB in the last 72 hours. Anemia Panel: No results for input(s): VITAMINB12, FOLATE, FERRITIN,  TIBC, IRON, RETICCTPCT in the last 72 hours. Urine analysis: No results found for: COLORURINE, APPEARANCEUR, LABSPEC, PHURINE, GLUCOSEU, HGBUR, BILIRUBINUR, KETONESUR, PROTEINUR, UROBILINOGEN, NITRITE, LEUKOCYTESUR  Creatinine Clearance: Estimated Creatinine Clearance: 61.2 mL/min (A) (by C-G formula based on SCr of 1.09 mg/dL (H)).  Sepsis Labs: @LABRCNTIP (procalcitonin:4,lacticidven:4) ) Recent Results (from the past 240 hour(s))  MRSA PCR Screening     Status: None   Collection Time: 04/13/17 10:54 PM  Result Value Ref Range Status   MRSA by PCR NEGATIVE NEGATIVE Final    Comment:        The GeneXpert MRSA Assay (FDA approved for NASAL specimens only), is one component of a comprehensive  MRSA colonization surveillance program. It is not intended to diagnose MRSA infection nor to guide or monitor treatment for MRSA infections.      Radiological Exams on Admission: Dg Chest 2 View  Result Date: 04/13/2017 CLINICAL DATA:  Shortness of breath and cough EXAM: CHEST  2 VIEW COMPARISON:  None. FINDINGS: There is no edema or consolidation. The heart size and pulmonary vascularity are normal. No adenopathy. There is degenerative change in the thoracic spine. IMPRESSION: No edema or consolidation. Electronically Signed   By: Bretta BangWilliam  Woodruff III M.D.   On: 04/13/2017 17:48    EKG: Independently reviewed.  Sinus tachycardia with rate 120; nonspecific ST changes with no evidence of acute ischemia; may be rate-related (will repeat in the AM)  Assessment/Plan Principal Problem:   Acute on chronic respiratory failure with hypoxia (HCC) Active Problems:   COPD with acute exacerbation (HCC)   Hyperglycemia   Acute on chronic respiratory failure with hypoxia due to acute COPD exacerbation -Patient's shortness of breath and cough are most likely caused by acute COPD exacerbation.  -She had outpatient failure with Augmentin and prednisone, thus necessitating admission. -Chest x-ray is  not consistent with pneumonia -Patient does not appear to have received antibiotics in the ER. -[facility] -She was accepted for admission patient to SDU bed  -She reports improvement and appears to be moving air reasonably well so will change BIPAP to prn to see if she is able to come off it. -Nebulizers: scheduled Duoneb and prn albuterol -Solu-Medrol 80 mg IV BID -IV Doxycycline.  -MRSA PCR negative  Hyperglycemia -Glucose 146; 105 in 10/16 -Given that it is >140, will check A1c and cover with SSI for now.   DVT prophylaxis:  Lovenox  Code Status:  Full - confirmed with patient Family Communication: None present Disposition Plan:  Home once clinically improved Consults called: PT/OT/Nutrition/CM/SW Admission status: Admit - It is my clinical opinion that admission to INPATIENT is reasonable and necessary because this patient will require at least 2 midnights in the hospital to treat this condition based on the medical complexity of the problems presented.  Given the aforementioned information, the predictability of an adverse outcome is felt to be significant.   Total critical care time: 45 minutes Critical care time was exclusive of separately billable procedures and treating other patients. Critical care was necessary to treat or prevent imminent or life-threatening deterioration. Critical care was time spent personally by me on the following activities: development of treatment plan with patient and/or surrogate as well as nursing, discussions with consultants, evaluation of patient's response to treatment, examination of patient, obtaining history from patient or surrogate, ordering and performing treatments and interventions, ordering and review of laboratory studies, ordering and review of radiographic studies, pulse oximetry and re-evaluation of patient's condition.   Jonah BlueJennifer Raphael Fitzpatrick MD [facility]  If 7PM-7AM, please contact  night-coverage [email or web address] Password [email or web address]  04/14/2017, 1:32 AM

## 2017-04-14 NOTE — Evaluation (Signed)
Physical Therapy Evaluation Patient Details Name: Judy [name] MRN: [id number] DOB: Jan 22, 1951 Today's Date: 04/14/2017   History of Present Illness  66 yo female admitted with acute on chronic respiratory failure. Hx of COPD, bipolar d/o, PTSD  Clinical Impression  On eval, pt required Min assist for mobility. She walked ~100 feet in hallway without an assistive device. O2 sat 92% 4L O2 at rest, 85% on 4L O2 with activity. Dyspnea 2/4 with activity. Discussed d/c plan-pt stated she will return home. At this time, recommend HHPT and int supervision/assist. Pt could progress well and may not need PT f/u-will continue to assess.     Follow Up Recommendations Home health PT;Supervision - Intermittent    Equipment Recommendations  None recommended by PT    Recommendations for Other Services       Precautions / Restrictions Precautions Precautions: Fall Precaution Comments: monitor O 2 sats Restrictions Weight Bearing Restrictions: No      Mobility  Bed Mobility Overal bed mobility: Needs Assistance Bed Mobility: Supine to Sit;Sit to Supine     Supine to sit: Supervision Sit to supine: Supervision   General bed mobility comments: safety, lines.   Transfers Overall transfer level: Needs assistance Equipment used: None Transfers: Sit to/from Stand Sit to Stand: Min guard         General transfer comment: close guard for safety.   Ambulation/Gait Ambulation/Gait assistance: Min assist Ambulation Distance (Feet): 100 Feet Assistive device: None Gait Pattern/deviations: Step-through pattern;Decreased stride length;Staggering left;Staggering right;Drifts right/left     General Gait Details: Assist to stabilize. O2 sat 85% on 4L O2 during ambulation, dyspnea 2/4.   Stairs            Wheelchair Mobility    Modified Rankin (Stroke Patients Only)       Balance Overall balance assessment: Needs assistance           Standing balance-Leahy Scale: Fair                               Pertinent Vitals/Pain Pain Assessment: No/denies pain    Home Living Family/patient expects to be discharged to:: Private residence Living Arrangements: Alone   Type of Home: House Home Access: Stairs to enter Entrance Stairs-Rails: Right Entrance Stairs-Number of Steps: 1 flight Home Layout: One level Home Equipment: None      Prior Function Level of Independence: Independent               Hand Dominance        Extremity/Trunk Assessment   Upper Extremity Assessment Upper Extremity Assessment: Generalized weakness    Lower Extremity Assessment Lower Extremity Assessment: Generalized weakness    Cervical / Trunk Assessment Cervical / Trunk Assessment: Normal  Communication   Communication: No difficulties  Cognition Arousal/Alertness: Awake/alert Behavior During Therapy: Anxious Overall Cognitive Status: Within Functional Limits for tasks assessed                                        General Comments      Exercises     Assessment/Plan    PT Assessment Patient needs continued PT services  PT Problem List Decreased strength;Decreased activity tolerance;Decreased mobility;Decreased balance;Cardiopulmonary status limiting activity       PT Treatment Interventions Gait training;Therapeutic activities;Therapeutic exercise;Patient/family education;Balance training;Functional mobility training    PT Goals (Current goals can  be found in the Care Plan section)  Acute Rehab PT Goals Patient Stated Goal: to get some rest. home.  PT Goal Formulation: With patient Time For Goal Achievement: 04/28/17 Potential to Achieve Goals: Good    Frequency Min 3X/week   Barriers to discharge        Co-evaluation               AM-PAC PT "6 Clicks" Daily Activity  Outcome Measure Difficulty turning over in bed (including adjusting bedclothes, sheets and blankets)?: A Little Difficulty moving from  lying on back to sitting on the side of the bed? : A Little Difficulty sitting down on and standing up from a chair with arms (e.g., wheelchair, bedside commode, etc,.)?: A Little Help needed moving to and from a bed to chair (including a wheelchair)?: A Little Help needed walking in hospital room?: A Little Help needed climbing 3-5 steps with a railing? : A Little 6 Click Score: 18    End of Session Equipment Utilized During Treatment: Oxygen Activity Tolerance: Patient tolerated treatment well Patient left: in bed;with call bell/phone within reach;with bed alarm set   PT Visit Diagnosis: Muscle weakness (generalized) (M62.81);Difficulty in walking, not elsewhere classified (R26.2)    Time: 5409-8119 PT Time Calculation (min) (ACUTE ONLY): 12 min   Charges:   PT Evaluation $PT Eval Low Complexity: 1 Procedure     PT G Codes:          Rebeca Alert, MPT Pager: [phone]

## 2017-04-15 LAB — HEMOGLOBIN A1C
Hgb A1c MFr Bld: 5.8 % — ABNORMAL HIGH (ref 4.8–5.6)
Mean Plasma Glucose: 120 mg/dL

## 2017-04-15 LAB — GLUCOSE, CAPILLARY
Glucose-Capillary: 127 mg/dL — ABNORMAL HIGH (ref 65–99)
Glucose-Capillary: 156 mg/dL — ABNORMAL HIGH (ref 65–99)
Glucose-Capillary: 191 mg/dL — ABNORMAL HIGH (ref 65–99)
Glucose-Capillary: 121 mg/dL — ABNORMAL HIGH (ref 65–99)

## 2017-04-15 MED ORDER — GUAIFENESIN-DM 100-10 MG/5ML PO SYRP
5.0000 mL | ORAL_SOLUTION | ORAL | Status: DC | PRN
  Administered 2017-04-15 – 2017-04-19 (×8): 5 mL via ORAL
  Filled 2017-04-15 (×8): qty 10

## 2017-04-15 MED ORDER — PHENOL 1.4 % MT LIQD
1.0000 | OROMUCOSAL | Status: DC | PRN
  Administered 2017-04-16: 1 via OROMUCOSAL
  Filled 2017-04-15: qty 177

## 2017-04-15 NOTE — Progress Notes (Signed)
PROGRESS NOTE    Judy [name]  ZOX:[id number] DOB: 1951/04/20 DOA: 04/13/2017 PCP: Doyle Askew, PA    Brief Narrative:  66 y.o. 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  Coughing since Tuesday evening. Nonproductive.  No h/o BIPAP - she does not feel like it is helping her to breathe.  She would like to try being off it.  Low-grade fever recently.   She was seen by her PCP earlier this week and given Augmentin and Prednisone and so has failed outpatient management.   ED Course: Low O2 sats on presentation with acute dyspnea and wheezing.  Given neb and solumedrol and started on BIPAP with improvement.  Assessment & Plan:   Principal Problem:   Acute on chronic respiratory failure with hypoxia (HCC) Active Problems:   COPD with acute exacerbation (HCC)   Hyperglycemia  Acute on chronic respiratory failure with hypoxia due to acute COPD exacerbation -Patient continue with an extremely wheezing with increased respiratory effort -Patient reportedly failed outpatient course of Augmentin and prednisone -Patient initially had required BiPAP at time of admission, continued on nasal cannula presently. Continue to wean O2 as tolerated -Patient continued on IV Solu-Medrol and scheduled breathing treatments -Patient is continued on empiric doxycycline  Hyperglycemia -Glucose 146; 105 in 10/16 -Will continue on sliding scale insulin given concurrent high-dose steroids  Acute hypoxemic respiratory failure -Secondary to above -Somewhat improved this AM  Leukocytosis -Suspect secondary to IV steroids -hemodynamically stable  DVT prophylaxis: Lovenox subcutaneous Code Status: Full code Family Communication: Patient in room, family not at bedside Disposition Plan: Uncertain at this time  Consultants:     Procedures:     Antimicrobials: Anti-infectives    Start     Dose/Rate Route Frequency Ordered Stop   04/14/17  0145  doxycycline (VIBRAMYCIN) 100 mg in dextrose 5 % 250 mL IVPB     100 mg 125 mL/hr over 120 Minutes Intravenous 2 times daily 04/14/17 0125        Subjective: Reports feeling somewhat better today  Objective: Vitals:   04/15/17 0825 04/15/17 1000 04/15/17 1130 04/15/17 1200  BP:   (!) 153/85   Pulse:  87 83 87  Resp:  (!) 24 20 (!) 24  Temp:    97.8 F (36.6 C)  TempSrc:    Oral  SpO2: 95% 94% 94% 91%  Weight:      Height:        Intake/Output Summary (Last 24 hours) at 04/15/17 1420 Last data filed at 04/15/17 1133  Gross per 24 hour  Intake              800 ml  Output              802 ml  Net               -2 ml   Filed Weights   04/13/17 1656  Weight: 85.7 kg (189 lb)    Examination: General exam: Awake, laying in bed, in nad Respiratory system: mildly increased resp effort, end-expiratory wheezing Cardiovascular system: regular rate, s1, s2 Gastrointestinal system: Soft, nondistended, positive BS Central nervous system: CN2-12 grossly intact, strength intact Extremities: Perfused, no clubbing Skin: Normal skin turgor, no notable skin lesions seen Psychiatry: Mood normal // no visual hallucinations    Data Reviewed: I have personally reviewed following labs and imaging studies  CBC:  Recent Labs Lab 04/13/17 1715 04/14/17 0325  WBC 8.9 12.0*  NEUTROABS 7.0  --  HGB 15.9* 13.1  HCT 47.0* 39.1  MCV 90.6 89.9  PLT 256 203   Basic Metabolic Panel:  Recent Labs Lab 04/13/17 1715 04/14/17 0325  NA 138 139  K 3.9 4.0  CL 103 104  CO2 22 22  GLUCOSE 146* 219*  BUN 16 17  CREATININE 1.09* 1.06*  CALCIUM 9.5 9.3   GFR: Estimated Creatinine Clearance: 63 mL/min (A) (by C-G formula based on SCr of 1.06 mg/dL (H)). Liver Function Tests: No results for input(s): AST, ALT, ALKPHOS, BILITOT, PROT, ALBUMIN in the last 168 hours. No results for input(s): LIPASE, AMYLASE in the last 168 hours. No results for input(s): AMMONIA in the last 168  hours. Coagulation Profile: No results for input(s): INR, PROTIME in the last 168 hours. Cardiac Enzymes:  Recent Labs Lab 04/13/17 1713  TROPONINI <0.03   BNP (last 3 results) No results for input(s): PROBNP in the last 8760 hours. HbA1C:  Recent Labs  04/14/17 0325  HGBA1C 5.8*   CBG:  Recent Labs Lab 04/14/17 1143 04/14/17 1709 04/14/17 2128 04/15/17 0750 04/15/17 1328  GLUCAP 179* 194* 168* 127* 156*   Lipid Profile: No results for input(s): CHOL, HDL, LDLCALC, TRIG, CHOLHDL, LDLDIRECT in the last 72 hours. Thyroid Function Tests: No results for input(s): TSH, T4TOTAL, FREET4, T3FREE, THYROIDAB in the last 72 hours. Anemia Panel: No results for input(s): VITAMINB12, FOLATE, FERRITIN, TIBC, IRON, RETICCTPCT in the last 72 hours. Sepsis Labs: No results for input(s): PROCALCITON, LATICACIDVEN in the last 168 hours.  Recent Results (from the past 240 hour(s))  MRSA PCR Screening     Status: None   Collection Time: 04/13/17 10:54 PM  Result Value Ref Range Status   MRSA by PCR NEGATIVE NEGATIVE Final    Comment:        The GeneXpert MRSA Assay (FDA approved for NASAL specimens only), is one component of a comprehensive MRSA colonization surveillance program. It is not intended to diagnose MRSA infection nor to guide or monitor treatment for MRSA infections.      Radiology Studies: Dg Chest 2 View  Result Date: 04/13/2017 CLINICAL DATA:  Shortness of breath and cough EXAM: CHEST  2 VIEW COMPARISON:  None. FINDINGS: There is no edema or consolidation. The heart size and pulmonary vascularity are normal. No adenopathy. There is degenerative change in the thoracic spine. IMPRESSION: No edema or consolidation. Electronically Signed   By: Bretta BangWilliam  Woodruff III M.D.   On: 04/13/2017 17:48    Scheduled Meds: . atorvastatin  40 mg Oral QHS  . benztropine  2 mg Oral BID  . docusate sodium  100 mg Oral BID  . enoxaparin (LOVENOX) injection  40 mg Subcutaneous  QHS  . gabapentin  300 mg Oral QHS  . insulin aspart  0-15 Units Subcutaneous TID WC  . ipratropium-albuterol  3 mL Nebulization QID  . lurasidone  80 mg Oral QHS  . mouth rinse  15 mL Mouth Rinse q12n4p  . methylPREDNISolone (SOLU-MEDROL) injection  80 mg Intravenous BID  . sodium chloride flush  3 mL Intravenous Q12H  . Vilazodone HCl  40 mg Oral Daily  . zolpidem  5 mg Oral QHS   Continuous Infusions: . doxycycline (VIBRAMYCIN) IV 100 mg (04/15/17 1012)  . lactated ringers 75 mL/hr at 04/15/17 0527     LOS: 2 days   Judythe Postema, Scheryl MartenSTEPHEN K, MD [facility] Pager [phone]  If 7PM-7AM, please contact night-coverage [email or web address] Password [email or web address] 04/15/2017, 2:20 PM

## 2017-04-16 ENCOUNTER — Inpatient Hospital Stay (HOSPITAL_COMMUNITY): Payer: Medicare Other

## 2017-04-16 DIAGNOSIS — J9621 Acute and chronic respiratory failure with hypoxia: Principal | ICD-10-CM

## 2017-04-16 LAB — URINALYSIS, ROUTINE W REFLEX MICROSCOPIC
Bacteria, UA: NONE SEEN
Bilirubin Urine: NEGATIVE
Glucose, UA: NEGATIVE mg/dL
Ketones, ur: NEGATIVE mg/dL
Leukocytes, UA: NEGATIVE
Nitrite: NEGATIVE
Protein, ur: NEGATIVE mg/dL
Specific Gravity, Urine: 1.018 (ref 1.005–1.030)
Squamous Epithelial / LPF: NONE SEEN
pH: 8 (ref 5.0–8.0)

## 2017-04-16 LAB — GLUCOSE, CAPILLARY
Glucose-Capillary: 182 mg/dL — ABNORMAL HIGH (ref 65–99)
Glucose-Capillary: 139 mg/dL — ABNORMAL HIGH (ref 65–99)
Glucose-Capillary: 221 mg/dL — ABNORMAL HIGH (ref 65–99)
Glucose-Capillary: 232 mg/dL — ABNORMAL HIGH (ref 65–99)

## 2017-04-16 IMAGING — CT CT ANGIO CHEST
2 of 6 series · 19 of 46 positions shown · IV contrast (isovue)
Comparison: Chest x-ray [DATE]

CLINICAL DATA: O cough, respiratory distress, hypoxia

EXAM:
CT ANGIOGRAPHY CHEST WITH CONTRAST
TECHNIQUE: Multidetector CT imaging of the chest was performed using the
standard protocol during bolus administration of intravenous
contrast. Multiplanar CT image reconstructions and MIPs were
obtained to evaluate the vascular anatomy.
CONTRAST:  100 cc Isovue 370 IV

[Series 6: thins · axial · 0.77mm/px · z∈[-243,+27]mm · 17 of 296 slices shown]
[im 13/296  lung]
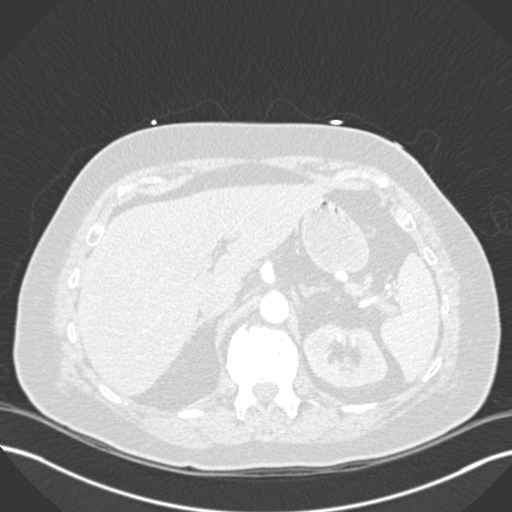
[im 26/296  soft-tissue]
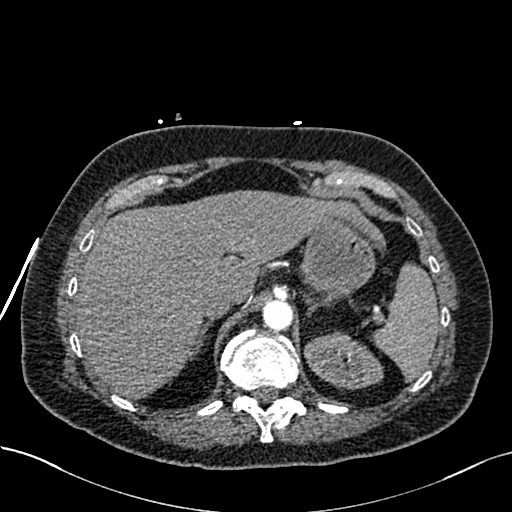
[im 52/296  lung]
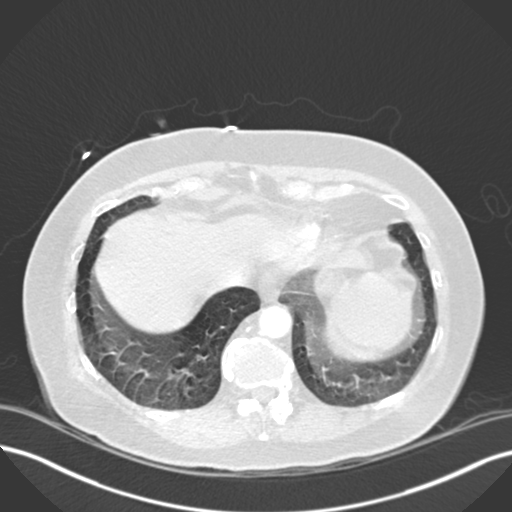
[im 65/296  soft-tissue]
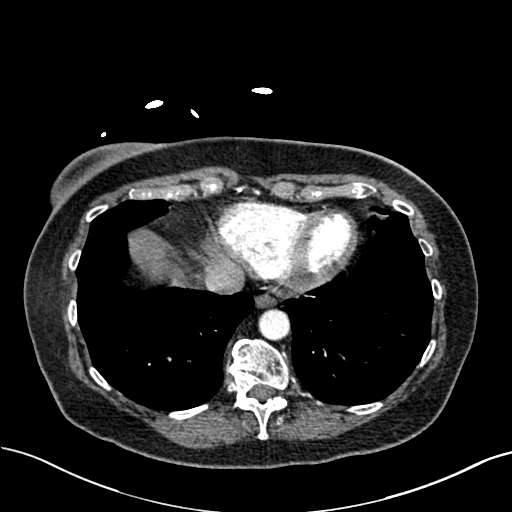
[im 77/296  lung]
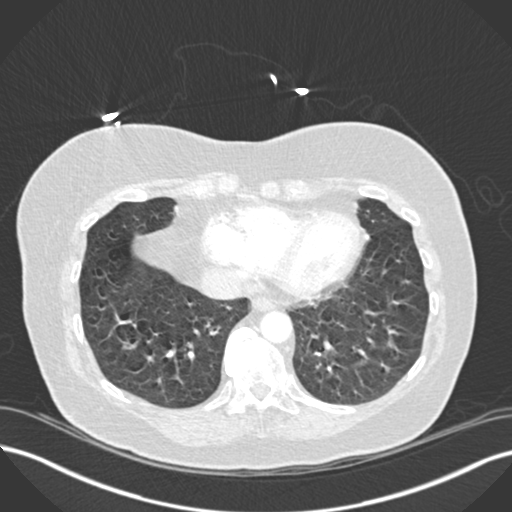
[im 103/296  soft-tissue]
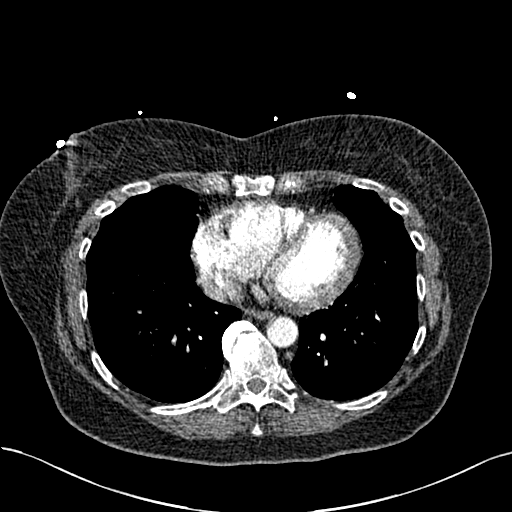
[im 116/296  lung]
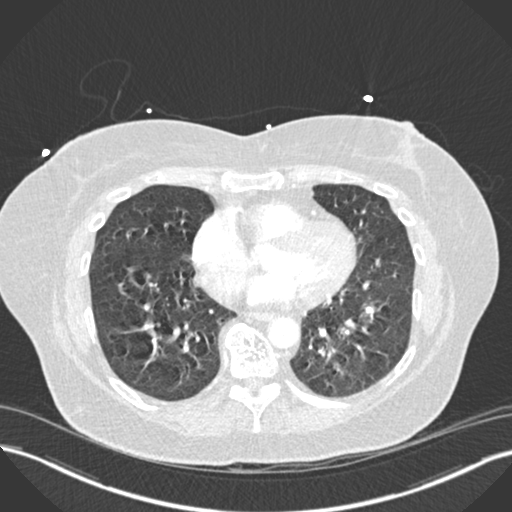
[im 129/296  soft-tissue]
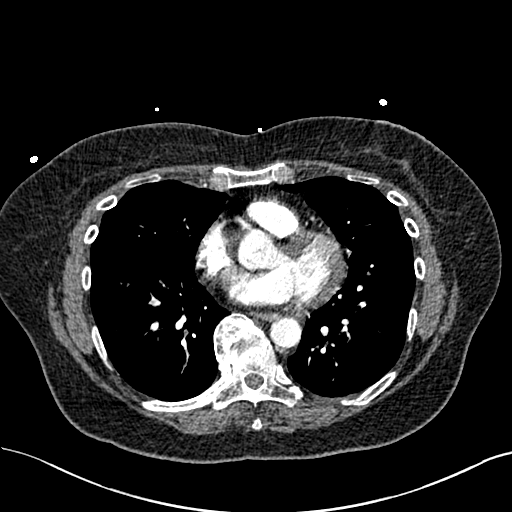
[im 154/296  lung]
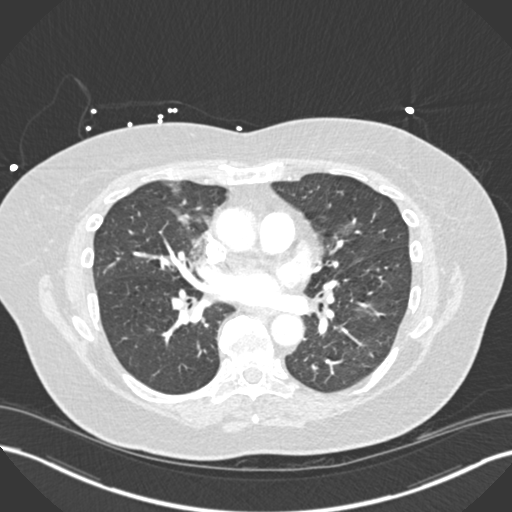
[im 167/296  soft-tissue]
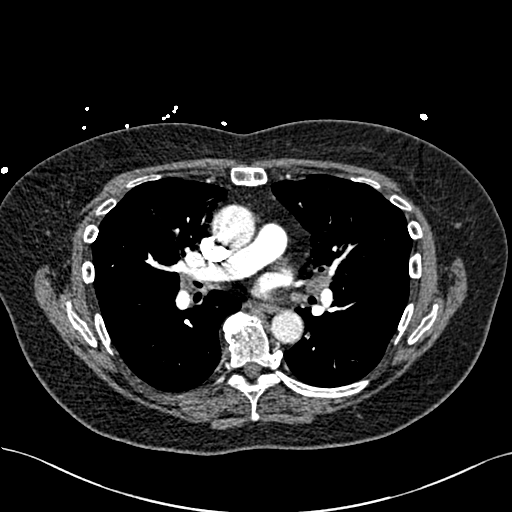
[im 180/296  lung]
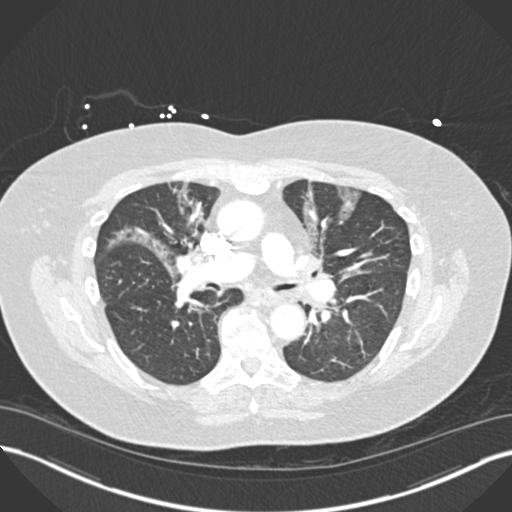
[im 193/296  soft-tissue]
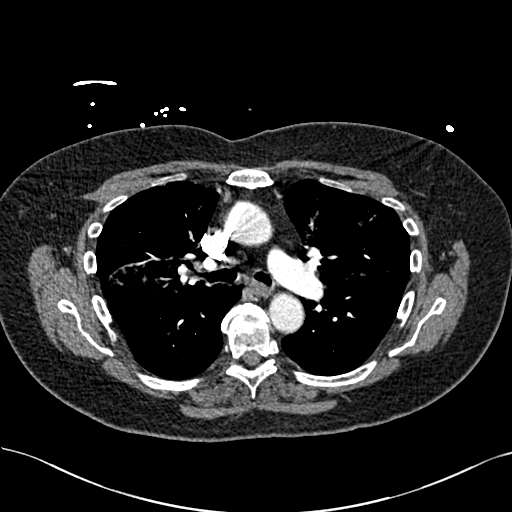
[im 219/296  lung]
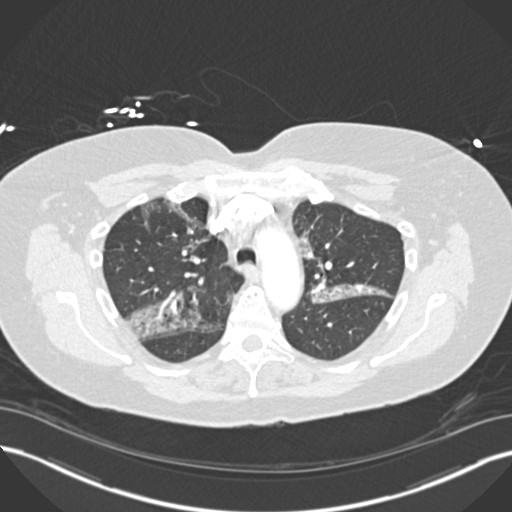
[im 231/296  soft-tissue]
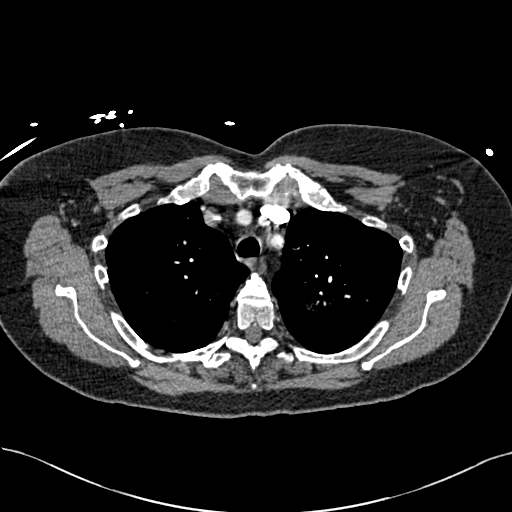
[im 244/296  lung]
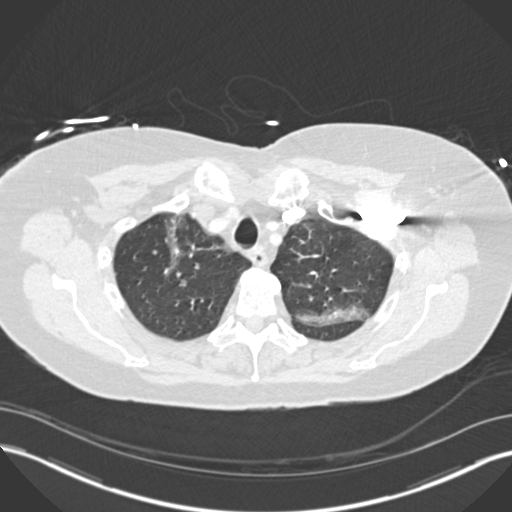
[im 270/296  soft-tissue]
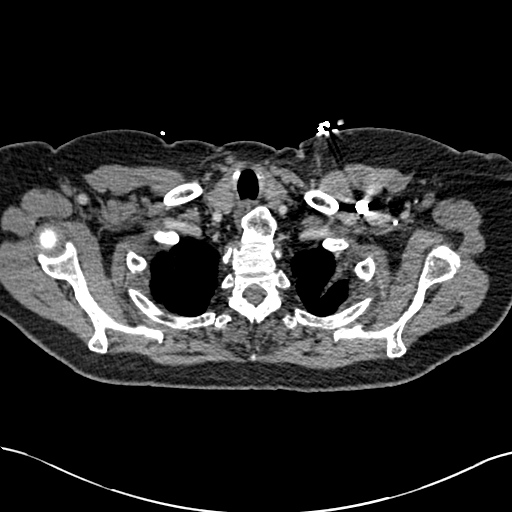
[im 283/296  lung]
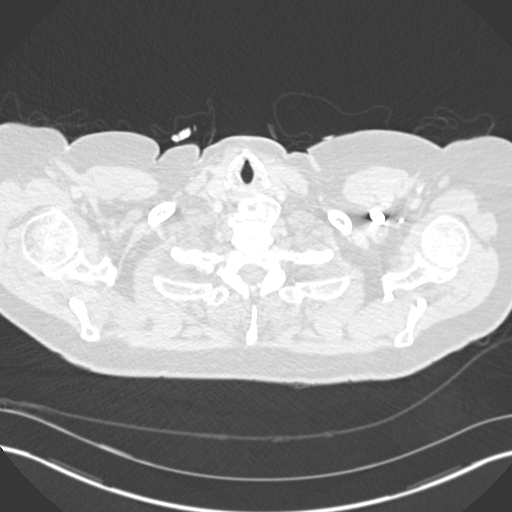

[Series 7: coronal mpr · coronal · 0.62mm/px · 2 of 87 slices shown]
[im 29/87  soft-tissue]
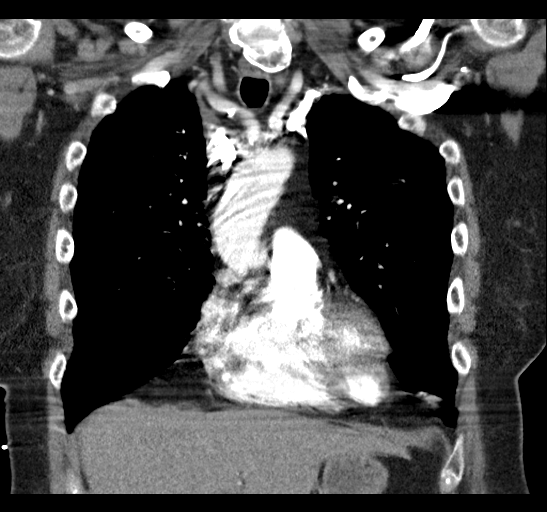
[im 58/87  soft-tissue]
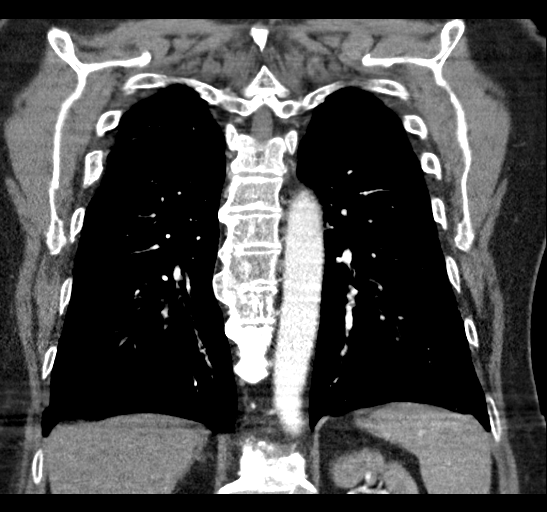

[19 of 46 positions shown; findings below may reference images not displayed]

FINDINGS: Cardiovascular: No filling defects in the pulmonary arteries to
suggest pulmonary emboli. Heart is normal size. Aorta is normal
caliber.

Mediastinum/Nodes: No mediastinal, hilar, or axillary adenopathy.
Trachea and esophagus are unremarkable.

Lungs/Pleura: Ground-glass opacities are noted in both upper lobes.
This likely reflects infectious/inflammatory process, likely small
airways disease/ alveolitis. No effusions.

Upper Abdomen: Imaging into the upper abdomen shows no acute
findings.

Musculoskeletal: Chest wall soft tissues are unremarkable. No acute
bony abnormality.

Review of the MIP images confirms the above findings.
IMPRESSION: No evidence of pulmonary embolus.

Patchy bilateral upper lobe ground-glass opacities, likely small
airways disease/ alveolitis. For

## 2017-04-16 MED ORDER — IOPAMIDOL (ISOVUE-370) INJECTION 76%
INTRAVENOUS | Status: AC
  Filled 2017-04-16: qty 100

## 2017-04-16 MED ORDER — ALPRAZOLAM 0.5 MG PO TABS
0.5000 mg | ORAL_TABLET | Freq: Four times a day (QID) | ORAL | Status: DC | PRN
  Administered 2017-04-16 – 2017-04-22 (×9): 0.5 mg via ORAL
  Filled 2017-04-16 (×10): qty 1

## 2017-04-16 MED ORDER — ALPRAZOLAM 0.5 MG PO TABS
0.5000 mg | ORAL_TABLET | Freq: Once | ORAL | Status: AC
  Administered 2017-04-16: 0.5 mg via ORAL
  Filled 2017-04-16: qty 1

## 2017-04-16 MED ORDER — VILAZODONE HCL 20 MG PO TABS
40.0000 mg | ORAL_TABLET | Freq: Every day | ORAL | Status: DC
  Administered 2017-04-17 – 2017-04-22 (×5): 40 mg via ORAL
  Filled 2017-04-16 (×11): qty 2

## 2017-04-16 MED ORDER — GUAIFENESIN ER 600 MG PO TB12
600.0000 mg | ORAL_TABLET | Freq: Two times a day (BID) | ORAL | Status: DC
  Administered 2017-04-16 – 2017-04-22 (×13): 600 mg via ORAL
  Filled 2017-04-16 (×13): qty 1

## 2017-04-16 MED ORDER — IOPAMIDOL (ISOVUE-370) INJECTION 76%
100.0000 mL | Freq: Once | INTRAVENOUS | Status: AC | PRN
  Administered 2017-04-16: 100 mL via INTRAVENOUS

## 2017-04-16 NOTE — Evaluation (Signed)
Occupational Therapy Evaluation Patient Details Name: Judy [name] MRN: [id number] DOB: 08/06/1951 Today's Date: 04/16/2017    History of Present Illness 66 yo female admitted with acute on chronic respiratory failure. Hx of COPD, bipolar d/o, PTSD   Clinical Impression   Pt was admitted for the above. RN reports that pt had difficulty with 02 saturation last evening and thought EOB activity would be good for today. Evaluated from this position.  Pt sat up for greater than 20 minutes and ate and brushed teeth. Assessed ADLs and began energy conservation education.  At baseline, pt is independent, however, she reports that she has been "down" and hasn't done much, including just eating peanut butter crackers for meals and not showering. She will benefit from continued OT in acute setting to increase activity tolerance and independence with adls. Goals in acute are for supervision level.    Follow Up Recommendations  Home health OT (depending upon progress)   Equipment Recommendations   (to be further assessed)    Recommendations for Other Services       Precautions / Restrictions Precautions Precautions: Fall Restrictions Weight Bearing Restrictions: No      Mobility Bed Mobility Overal bed mobility: Modified Independent                Transfers                 General transfer comment: only scooted EOB today due to sats.  Stood yesterday with PT with min guard    Balance                                           ADL either performed or assessed with clinical judgement   ADL Overall ADL's : Needs assistance/impaired Eating/Feeding: Independent;Sitting   Grooming: Oral care;Set up;Sitting   Upper Body Bathing: Set up;Sitting   Lower Body Bathing: Minimal assistance;Sit to/from stand   Upper Body Dressing : Set up;Sitting   Lower Body Dressing: Minimal assistance;Sit to/from stand (doesn't wear socks at home)                  General ADL Comments: sat EOB and ate and performed grooming.  Has desaturation last night and RN recommended no OOB today     Vision         Perception     Praxis      Pertinent Vitals/Pain Pain Assessment: No/denies pain     Hand Dominance     Extremity/Trunk Assessment Upper Extremity Assessment Upper Extremity Assessment: Generalized weakness           Communication Communication Communication: No difficulties   Cognition Arousal/Alertness: Awake/alert Behavior During Therapy: Anxious Overall Cognitive Status: Within Functional Limits for tasks assessed                                     General Comments  pt was on 5 liters 02.  Sats 88-90 most of session.  Decreased to 85% when she returned to supine. Cued for pursed lip breathing.  Educated on energy conservation when she was sitting up    Exercises     Shoulder Instructions      Home Living Family/patient expects to be discharged to:: Private residence Living Arrangements: Alone Available Help at Discharge: Family  Bathroom Toilet: Standard         Additional Comments: pt hasn't felt up to showering. has been down for a couple of weeks      Prior Functioning/Environment Level of Independence: Independent        Comments: pt has mostly been eating peanut butter and jelly crackers.  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 Doesn't do housework        OT Problem List: Decreased strength;Decreased activity tolerance;Cardiopulmonary status limiting activity;Decreased knowledge of use of DME or AE (balance NT)      OT Treatment/Interventions: Self-care/ADL training;Energy conservation;DME and/or AE instruction;Therapeutic activities;Patient/family education    OT Goals(Current goals can be found in the care plan section) Acute Rehab OT Goals Patient Stated Goal: to get some rest. home.  OT Goal  Formulation: With patient Time For Goal Achievement: 04/30/17 Potential to Achieve Goals: Good ADL Goals Pt Will Transfer to Toilet: with supervision;ambulating;bedside commode Additional ADL Goal #1: pt will complete adl with supervision/set up, sit to stand  Additional ADL Goal #2: pt will verbalize 3 energy conservation techniques and initiate at least one rest break without cues during OT session  OT Frequency: Min 2X/week   Barriers to D/C:            Co-evaluation              AM-PAC PT "6 Clicks" Daily Activity     Outcome Measure Help from another person eating meals?: None Help from another person taking care of personal grooming?: A Little Help from another person toileting, which includes using toliet, bedpan, or urinal?: A Little Help from another person bathing (including washing, rinsing, drying)?: A Little Help from another person to put on and taking off regular upper body clothing?: A Little Help from another person to put on and taking off regular lower body clothing?: A Little 6 Click Score: 19   End of Session    Activity Tolerance: Treatment limited secondary to medical complications (Comment) Patient left: in bed;with call bell/phone within reach;with family/visitor present  OT Visit Diagnosis: Muscle weakness (generalized) (M62.81)                Time: 1610-9604 OT Time Calculation (min): 31 min Charges:  OT General Charges $OT Visit: 1 Procedure OT Evaluation $OT Eval Moderate Complexity: 1 Procedure OT Treatments $Therapeutic Activity: 8-22 mins G-Codes:     [place], OTR/L [phone] 04/16/2017  Judy [name] 04/16/2017, 2:22 PM

## 2017-04-16 NOTE — Progress Notes (Signed)
Nutrition Follow-up  DOCUMENTATION CODES:   Not applicable  INTERVENTION:  - Continue to encourage PO intakes. - RD will continue to monitor for additional nutrition-related needs.  NUTRITION DIAGNOSIS:   Increased nutrient needs related to  (COPD) as evidenced by estimated needs. -ongoing  GOAL:   Patient will meet greater than or equal to 90% of their needs -beginning to meet.   MONITOR:   PO intake, Weight trends, Labs  ASSESSMENT:   66 y.o. 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  Coughing since Tuesday evening. Nonproductive.  No h/o BIPAP - she does not feel like it is helping her to breathe.  She would like to try being off it.  Low-grade fever recently.   She was seen by her PCP earlier this week and given Augmentin and Prednisone and so has failed outpatient management.  7/11 No intakes documented since previous assessment. Pt reports 100% completion of dinner last night and breakfast this AM. Ensure not currently ordered. Pt would prefer supplements not be ordered now that appetite and intakes have improved and she feels she will continue to eat well. She denies any chewing or swallowing issues last night or this AM. No new weight since admission on 7/8.  Medications reviewed; 100 mg Colace BID, sliding scale Novolog, 80 mg Solu-medrol BID. Labs reviewed; CBG: 182 mg/dL this AM, no BMP since 7/9.  IVF: LR @ 75 mL/hr.     7/9 - Pt in room with daughter at bedside.  - Pt reports not eating anything since Tuesday, 7/3, d/t SOB.  - Pt ordered an omelet, potatoes and sausage this morning but did not eat any of it.  - States the omelet was too dry and she could not swallow it.  - Pt is willing to try protein supplements while she is not eating well. RD will order Ensure. - Pt reports no weight loss. Pt with no depletion except for mildly in patellar region.   Diet Order:  Diet regular Room service appropriate? Yes;  Fluid consistency: Thin  Skin:  Reviewed, no issues  Last BM:  7/10  Height:   Ht Readings from Last 1 Encounters:  04/13/17 5\' 10"  (1.778 m)    Weight:   Wt Readings from Last 1 Encounters:  04/13/17 189 lb (85.7 kg)    Ideal Body Weight:  68.2 kg  BMI:  Body mass index is 27.12 kg/m.  Estimated Nutritional Needs:   Kcal:  2000-2200  Protein:  100-110g  Fluid:  2L/day  EDUCATION NEEDS:   Education needs addressed    Trenton GammonJessica Khaidyn Staebell, MS, RD, LDN, CNSC Inpatient Clinical Dietitian Pager # [phone] After hours/weekend pager # [phone]

## 2017-04-16 NOTE — Progress Notes (Signed)
PROGRESS NOTE    Judy [name]  ZOX:[id number] DOB: 02-Feb-1951 DOA: 04/13/2017 PCP: Doyle Askew, PA    Brief Narrative:  66 y.o. 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  Coughing since Tuesday evening. Nonproductive.  No h/o BIPAP - she does not feel like it is helping her to breathe.  She would like to try being off it.  Low-grade fever recently.   She was seen by her PCP earlier this week and given Augmentin and Prednisone and so has failed outpatient management.   ED Course: Low O2 sats on presentation with acute dyspnea and wheezing.  Given neb and solumedrol and started on BIPAP with improvement.  Assessment & Plan:   Principal Problem:   Acute on chronic respiratory failure with hypoxia (HCC) Active Problems:   COPD with acute exacerbation (HCC)   Hyperglycemia  Acute on chronic respiratory failure with hypoxia due to acute COPD exacerbation -Patient continue with an extremely wheezing with increased respiratory effort -Patient reportedly failed outpatient course of Augmentin and prednisone -Patient initially had required BiPAP at time of admission, continued on nasal cannula presently. Continue to wean O2 as tolerated -Patient continued on IV Solu-Medrol and scheduled breathing treatments -Patient is continued on empiric doxycycline  Hyperglycemia -Glucose 146; 105 in 10/16 -Will continue on sliding scale insulin given concurrent high-dose steroids  Acute hypoxemic respiratory failure -Secondary to above -remain significant sob, hypoxia, cta chest ordered  Leukocytosis -Suspect secondary to IV steroids -hemodynamically stable  DVT prophylaxis: Lovenox subcutaneous Code Status: Full code Family Communication: Patient in room, family not at bedside Disposition Plan: remain significant wheezing, sob , remain in stepdown  Consultants:   none  Procedures:   none  Antimicrobials: Anti-infectives    Start     Dose/Rate Route Frequency Ordered Stop   04/14/17 0145  doxycycline (VIBRAMYCIN) 100 mg in dextrose 5 % 250 mL IVPB     100 mg 125 mL/hr over 120 Minutes Intravenous 2 times daily 04/14/17 0125        Subjective: Foley was put in due to constently getting out of bed to urinate last night Remain diffuse wheezing, report over all feeling a litter better Reports feeling somewhat better today  Objective: Vitals:   04/16/17 0023 04/16/17 0336 04/16/17 0400 04/16/17 0500  BP: (!) 170/80  (!) 165/91   Pulse: 73  65 61  Resp: (!) 25  17 16   Temp:  98.6 F (37 C)    TempSrc:  Axillary    SpO2: 93%  94% 96%  Weight:      Height:        Intake/Output Summary (Last 24 hours) at 04/16/17 0742 Last data filed at 04/16/17 0600  Gross per 24 hour  Intake             3895 ml  Output             2702 ml  Net             1193 ml   Filed Weights   04/13/17 1656  Weight: 85.7 kg (189 lb)    Examination: General exam: Awake, laying in bed, in nad Respiratory system: mildly increased resp effort, bilateral diffuse end-expiratory wheezing Cardiovascular system: regular rate, s1, s2 Gastrointestinal system: Soft, nondistended, positive BS Central nervous system: CN2-12 grossly intact, strength intact Extremities: Perfused, no clubbing Skin: Normal skin turgor, no notable skin lesions seen Psychiatry: Mood normal // no visual hallucinations  Data Reviewed: I have personally reviewed following labs and imaging studies  CBC:  Recent Labs Lab 04/13/17 1715 04/14/17 0325  WBC 8.9 12.0*  NEUTROABS 7.0  --   HGB 15.9* 13.1  HCT 47.0* 39.1  MCV 90.6 89.9  PLT 256 203   Basic Metabolic Panel:  Recent Labs Lab 04/13/17 1715 04/14/17 0325  NA 138 139  K 3.9 4.0  CL 103 104  CO2 22 22  GLUCOSE 146* 219*  BUN 16 17  CREATININE 1.09* 1.06*  CALCIUM 9.5 9.3   GFR: Estimated Creatinine Clearance: 63 mL/min (A) (by C-G formula based on SCr of 1.06 mg/dL (H)). Liver  Function Tests: No results for input(s): AST, ALT, ALKPHOS, BILITOT, PROT, ALBUMIN in the last 168 hours. No results for input(s): LIPASE, AMYLASE in the last 168 hours. No results for input(s): AMMONIA in the last 168 hours. Coagulation Profile: No results for input(s): INR, PROTIME in the last 168 hours. Cardiac Enzymes:  Recent Labs Lab 04/13/17 1713  TROPONINI <0.03   BNP (last 3 results) No results for input(s): PROBNP in the last 8760 hours. HbA1C:  Recent Labs  04/14/17 0325  HGBA1C 5.8*   CBG:  Recent Labs Lab 04/15/17 0750 04/15/17 1328 04/15/17 1652 04/15/17 2146 04/16/17 0720  GLUCAP 127* 156* 191* 121* 182*   Lipid Profile: No results for input(s): CHOL, HDL, LDLCALC, TRIG, CHOLHDL, LDLDIRECT in the last 72 hours. Thyroid Function Tests: No results for input(s): TSH, T4TOTAL, FREET4, T3FREE, THYROIDAB in the last 72 hours. Anemia Panel: No results for input(s): VITAMINB12, FOLATE, FERRITIN, TIBC, IRON, RETICCTPCT in the last 72 hours. Sepsis Labs: No results for input(s): PROCALCITON, LATICACIDVEN in the last 168 hours.  Recent Results (from the past 240 hour(s))  MRSA PCR Screening     Status: None   Collection Time: 04/13/17 10:54 PM  Result Value Ref Range Status   MRSA by PCR NEGATIVE NEGATIVE Final    Comment:        The GeneXpert MRSA Assay (FDA approved for NASAL specimens only), is one component of a comprehensive MRSA colonization surveillance program. It is not intended to diagnose MRSA infection nor to guide or monitor treatment for MRSA infections.      Radiology Studies: No results found.  Scheduled Meds: . atorvastatin  40 mg Oral QHS  . benztropine  2 mg Oral BID  . docusate sodium  100 mg Oral BID  . enoxaparin (LOVENOX) injection  40 mg Subcutaneous QHS  . gabapentin  300 mg Oral QHS  . guaiFENesin  600 mg Oral BID  . insulin aspart  0-15 Units Subcutaneous TID WC  . ipratropium-albuterol  3 mL Nebulization QID  .  lurasidone  80 mg Oral QHS  . mouth rinse  15 mL Mouth Rinse q12n4p  . methylPREDNISolone (SOLU-MEDROL) injection  80 mg Intravenous BID  . sodium chloride flush  3 mL Intravenous Q12H  . Vilazodone HCl  40 mg Oral Daily  . zolpidem  5 mg Oral QHS   Continuous Infusions: . doxycycline (VIBRAMYCIN) IV Stopped (04/16/17 0005)  . lactated ringers 75 mL/hr at 04/15/17 2204     LOS: 3 days    Time spent> 35mins  Stela Iwasaki, MD PhD [facility] Pager [phone]  If 7PM-7AM, please contact night-coverage [email or web address] Password [email or web address] 04/16/2017, 7:42 AM

## 2017-04-17 LAB — RESPIRATORY PANEL BY PCR

## 2017-04-17 LAB — GLUCOSE, CAPILLARY
Glucose-Capillary: 131 mg/dL — ABNORMAL HIGH (ref 65–99)
Glucose-Capillary: 163 mg/dL — ABNORMAL HIGH (ref 65–99)
Glucose-Capillary: 137 mg/dL — ABNORMAL HIGH (ref 65–99)
Glucose-Capillary: 230 mg/dL — ABNORMAL HIGH (ref 65–99)

## 2017-04-17 LAB — BASIC METABOLIC PANEL
Anion gap: 6 (ref 5–15)
BUN: 12 mg/dL (ref 6–20)
CO2: 31 mmol/L (ref 22–32)
Calcium: 9.2 mg/dL (ref 8.9–10.3)
Chloride: 105 mmol/L (ref 101–111)
Creatinine, Ser: 0.63 mg/dL (ref 0.44–1.00)
GFR calc Af Amer: >60 mL/min (ref >60)
GFR calc non Af Amer: >60 mL/min (ref >60)
Glucose, Bld: 156 mg/dL — ABNORMAL HIGH (ref 65–99)
Potassium: 4.1 mmol/L (ref 3.5–5.1)
Sodium: 142 mmol/L (ref 135–145)

## 2017-04-17 LAB — CBC WITH DIFFERENTIAL/PLATELET
Basophils Absolute: 0 10*3/uL (ref 0.0–0.1)
Basophils Relative: 0 %
Eosinophils Absolute: 0 10*3/uL (ref 0.0–0.7)
Eosinophils Relative: 0 %
HCT: 39.1 % (ref 36.0–46.0)
Hemoglobin: 12.9 g/dL (ref 12.0–15.0)
Lymphocytes Relative: 7 %
Lymphs Abs: 0.9 10*3/uL (ref 0.7–4.0)
MCH: 29.7 pg (ref 26.0–34.0)
MCHC: 33 g/dL (ref 30.0–36.0)
MCV: 89.9 fL (ref 78.0–100.0)
Monocytes Absolute: 0.6 10*3/uL (ref 0.1–1.0)
Monocytes Relative: 4 %
Neutro Abs: 12.2 10*3/uL — ABNORMAL HIGH (ref 1.7–7.7)
Neutrophils Relative %: 89 %
Platelets: 210 10*3/uL (ref 150–400)
RBC: 4.35 MIL/uL (ref 3.87–5.11)
RDW: 13.3 % (ref 11.5–15.5)
WBC: 13.7 10*3/uL — ABNORMAL HIGH (ref 4.0–10.5)

## 2017-04-17 LAB — MAGNESIUM: Magnesium: 2.1 mg/dL (ref 1.7–2.4)

## 2017-04-17 MED ORDER — METHYLPREDNISOLONE SODIUM SUCC 125 MG IJ SOLR
60.0000 mg | Freq: Four times a day (QID) | INTRAMUSCULAR | Status: DC
  Administered 2017-04-17 – 2017-04-19 (×8): 60 mg via INTRAVENOUS
  Filled 2017-04-17 (×8): qty 2

## 2017-04-17 MED ORDER — BENZONATATE 100 MG PO CAPS: 100.0000 mg | ORAL_CAPSULE | Freq: Three times a day (TID) | ORAL | Status: DC | PRN

## 2017-04-17 MED ORDER — FLUCONAZOLE 100 MG PO TABS
150.0000 mg | ORAL_TABLET | Freq: Once | ORAL | Status: AC
  Administered 2017-04-17: 150 mg via ORAL
  Filled 2017-04-17: qty 2

## 2017-04-17 MED ORDER — SODIUM CHLORIDE 3 % IN NEBU
4.0000 mL | INHALATION_SOLUTION | Freq: Two times a day (BID) | RESPIRATORY_TRACT | Status: AC
  Administered 2017-04-17 – 2017-04-18 (×4): 4 mL via RESPIRATORY_TRACT
  Filled 2017-04-17 (×3): qty 4

## 2017-04-17 MED ORDER — NYSTATIN 100000 UNIT/ML MT SUSP
5.0000 mL | Freq: Four times a day (QID) | OROMUCOSAL | Status: DC
  Administered 2017-04-17 – 2017-04-22 (×20): 500000 [IU] via ORAL
  Filled 2017-04-17 (×20): qty 5

## 2017-04-17 MED ORDER — AZITHROMYCIN 250 MG PO TABS
500.0000 mg | ORAL_TABLET | Freq: Every day | ORAL | Status: DC
  Administered 2017-04-17 – 2017-04-22 (×6): 500 mg via ORAL
  Filled 2017-04-17 (×6): qty 2

## 2017-04-17 NOTE — Progress Notes (Addendum)
Patient refused foley care last night due to wanting to sleep.  Educated patient on importantance. Patient just wanted me to leave the room.

## 2017-04-17 NOTE — Care Management Note (Signed)
Case Management Note  Patient Details  Name: Lucy ChrisCathy Berberich MRN: [id number] Date of Birth: [birth year]  Subjective/Objective:                  COPD  Action/Plan: Date:  April 17, 2017  Chart reviewed for concurrent status and case management needs.  Will continue to follow patient progress.  Discharge Planning: following for needs Oley Balm/lives alone Expected discharge date: [id number]  Marcelle SmilingRhonda Lenny Bouchillon, BSN, LebamRN3, ConnecticutCCM   [phone]   Expected Discharge Date:  04/17/17               Expected Discharge Plan:  Home/Self Care  In-House Referral:     Discharge planning Services  CM Consult  Post Acute Care Choice:    Choice offered to:     DME Arranged:    DME Agency:     HH Arranged:    HH Agency:     Status of Service:  In process, will continue to follow  If discussed at Long Length of Stay Meetings, dates discussed:    Additional Comments:  Golda AcreDavis, Pope Brunty Lynn, RN 04/17/2017, 9:24 AM

## 2017-04-17 NOTE — Progress Notes (Signed)
Physical Therapy Treatment Patient Details Name: Judy [name] MRN: [id number] DOB: [birth year] Today's Date: 04/17/2017    History of Present Illness 66 yo female admitted with acute on chronic respiratory failure. Hx of COPD, bipolar d/o, PTSD    PT Comments    Pt in dark room on 4 lts sats 94%, HR 86.  Assisted OOB   Follow Up Recommendations  Home health PT;Supervision - Intermittent     Equipment Recommendations  None recommended by PT    Recommendations for Other Services       Precautions / Restrictions Precautions Precautions: Fall Precaution Comments: monitor O 2 sats      COPD Restrictions Weight Bearing Restrictions: No    Mobility  Bed Mobility Overal bed mobility: Modified Independent Bed Mobility: Supine to Sit;Sit to Supine     Supine to sit: Supervision Sit to supine: Supervision   General bed mobility comments: safety, lines.   Transfers Overall transfer level: Needs assistance Equipment used: None Transfers: Sit to/from Stand Sit to Stand: Min guard;Supervision         General transfer comment: good use of hands to steady self  Ambulation/Gait Ambulation/Gait assistance: Min guard;Supervision Ambulation Distance (Feet): 225 Feet Assistive device: None;2 person hand held assist (MinGuard Assist for safety)   Gait velocity: WFL   General Gait Details: remained on 4 lts nasal sats ranged from 95% to 88% with 2/4 DOE and mod unproductive cough   Stairs            Wheelchair Mobility    Modified Rankin (Stroke Patients Only)       Balance                                            Cognition Arousal/Alertness: Awake/alert Behavior During Therapy: WFL for tasks assessed/performed Overall Cognitive Status: Within Functional Limits for tasks assessed                                        Exercises      General Comments        Pertinent Vitals/Pain Pain Assessment: No/denies pain     Home Living                      Prior Function            PT Goals (current goals can now be found in the care plan section) Progress towards PT goals: Progressing toward goals    Frequency    Min 3X/week      PT Plan Current plan remains appropriate    Co-evaluation              AM-PAC PT "6 Clicks" Daily Activity  Outcome Measure  Difficulty turning over in bed (including adjusting bedclothes, sheets and blankets)?: A Little Difficulty moving from lying on back to sitting on the side of the bed? : A Little Difficulty sitting down on and standing up from a chair with arms (e.g., wheelchair, bedside commode, etc,.)?: A Little Help needed moving to and from a bed to chair (including a wheelchair)?: A Little Help needed walking in hospital room?: A Little Help needed climbing 3-5 steps with a railing? : A Little 6 Click Score: 18    End of Session Equipment Utilized  During Treatment: Oxygen Activity Tolerance: Patient tolerated treatment well Patient left: in bed;with call bell/phone within reach;with bed alarm set Nurse Communication: Mobility status PT Visit Diagnosis: Muscle weakness (generalized) (M62.81);Difficulty in walking, not elsewhere classified (R26.2)     Time: 1610-96041114-1142 PT Time Calculation (min) (ACUTE ONLY): 28 min  Charges:  $Gait Training: 8-22 mins $Therapeutic Activity: 8-22 mins                    G Codes:       Felecia ShellingLori Sashay Felling  PTA [facility]  Acute  Rehab Pager      [phone]

## 2017-04-17 NOTE — Progress Notes (Signed)
V-60 pulled from pts. room-no use > 24 hrs., RT to monitor.

## 2017-04-17 NOTE — Progress Notes (Signed)
PROGRESS NOTE    Judy [name]  ZOX:[id number] DOB: 12/14/1950 DOA: 04/13/2017 PCP: Doyle Askew, PA    Brief Narrative:  66 y.o. 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  Coughing since Tuesday evening. Nonproductive.  No h/o BIPAP - she does not feel like it is helping her to breathe.  She would like to try being off it.  Low-grade fever recently.   She was seen by her PCP earlier this week and given Augmentin and Prednisone and so has failed outpatient management.   ED Course: Low O2 sats on presentation with acute dyspnea and wheezing.  Given neb and solumedrol and started on BIPAP with improvement.  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 Not able to wean off o2, remain significan wheezing and dyspnea , even at rest,  -mrsa screening negative, respiratory panel + adenovirus,  - she has significant cough, but not productive, continue abx to cover possible superimposed bacteria infection.She did not improve on doxycycline, change abx to zithromax -increase IV Solu-Medrol and scheduled breathing treatments, add hypertonic saline nebs  Oral thrush: Denies odynophagia Topical nystatin, oral diflucan  Hyperglycemia -a1c 5.8 -likely due to stress and steroids -Will continue on sliding scale insulin given concurrent high-dose steroids   Leukocytosis -Suspect secondary to IV steroids -hemodynamically stable   Anxiety  /depression Denies SI/HI Continue home meds   DVT prophylaxis: Lovenox subcutaneous Code Status: Full code Family Communication: Patient in room, family not at bedside Disposition Plan: remain significant wheezing, sob , remain in stepdown  Consultants:   none  Procedures:   none  Antimicrobials: Anti-infectives    Start     Dose/Rate Route Frequency Ordered Stop   04/14/17 0145  doxycycline (VIBRAMYCIN) 100 mg in dextrose 5 % 250 mL IVPB     100 mg 125 mL/hr over 120 Minutes Intravenous 2 times daily 04/14/17 0125        Subjective: Still significant cough, wheezing, hypoxia, significant DOE No fever,   Objective: Vitals:   04/17/17 0200 04/17/17 0328 04/17/17 0400 04/17/17 0600  BP: (!) 146/64  (!) 124/56 (!) 161/88  Pulse: 66  63 (!) 56  Resp: 18  17 19   Temp:  98.7 F (37.1 C)    TempSrc:  Oral    SpO2: 95%  96% 95%  Weight:      Height:        Intake/Output Summary (Last 24 hours) at 04/17/17 0724 Last data filed at 04/16/17 1955  Gross per 24 hour  Intake             1195 ml  Output             2075 ml  Net             -880 ml   Filed Weights   04/13/17 1656  Weight: 85.7 kg (189 lb)    Examination: General exam: Awake, laying in bed, still coughing and wheezing,  Respiratory system: mildly increased resp effort, bilateral diffuse end-expiratory wheezing Cardiovascular system: regular rate, s1,  s2 Gastrointestinal system: Soft, nondistended, positive BS Central nervous system: CN2-12 grossly intact, strength intact Extremities: Perfused, no clubbing Skin: Normal skin turgor, no notable skin lesions seen Psychiatry: Mood normal // no visual hallucinations    Data Reviewed: I have personally reviewed following labs and imaging studies  CBC:  Recent Labs Lab 04/13/17 1715 04/14/17 0325 04/17/17 0302  WBC 8.9 12.0* 13.7*  NEUTROABS 7.0  --  12.2*  HGB 15.9* 13.1 12.9  HCT 47.0* 39.1 39.1  MCV 90.6 89.9 89.9  PLT 256 203 210   Basic  Metabolic Panel:  Recent Labs Lab 04/13/17 1715 04/14/17 0325 04/17/17 0302  NA 138 139 142  K 3.9 4.0 4.1  CL 103 104 105  CO2 22 22 31   GLUCOSE 146* 219* 156*  BUN 16 17 12   CREATININE 1.09* 1.06* 0.63  CALCIUM 9.5 9.3 9.2  MG  --   --  2.1   GFR: Estimated Creatinine Clearance: 83.5 mL/min (by C-G formula based on SCr of 0.63 mg/dL). Liver Function Tests: No results for input(s): AST, ALT, ALKPHOS, BILITOT, PROT, ALBUMIN in the last 168 hours. No results for input(s): LIPASE, AMYLASE in the last 168 hours. No results for input(s): AMMONIA in the last 168 hours. Coagulation Profile: No results for input(s): INR, PROTIME in the last 168 hours. Cardiac Enzymes:  Recent Labs Lab 04/13/17 1713  TROPONINI <0.03   BNP (last 3 results) No results for input(s): PROBNP in the last 8760 hours. HbA1C: No results for input(s): HGBA1C in the last 72 hours. CBG:  Recent Labs Lab 04/15/17 2146 04/16/17 0720 04/16/17 1159 04/16/17 1552 04/16/17 2118  GLUCAP 121* 182* 139* 221* 232*   Lipid Profile: No results for input(s): CHOL, HDL, LDLCALC, TRIG, CHOLHDL, LDLDIRECT in the last 72 hours. Thyroid Function Tests: No results for input(s): TSH, T4TOTAL, FREET4, T3FREE, THYROIDAB in the last 72 hours. Anemia Panel: No results for input(s): VITAMINB12, FOLATE, FERRITIN, TIBC, IRON, RETICCTPCT in the last 72 hours. Sepsis Labs: No results for input(s): PROCALCITON, LATICACIDVEN in the last 168 hours.  Recent Results (from the past 240 hour(s))  MRSA PCR Screening     Status: None   Collection Time: 04/13/17 10:54 PM  Result Value Ref Range Status   MRSA by PCR NEGATIVE NEGATIVE Final    Comment:        The GeneXpert MRSA Assay (FDA approved for NASAL specimens only), is one component of a comprehensive MRSA colonization surveillance program. It is not intended to diagnose MRSA infection nor to guide or monitor treatment for MRSA infections.      Radiology  Studies: Ct Angio Chest Pe W Or Wo Contrast  Result Date: 04/16/2017 CLINICAL DATA:  O cough, respiratory distress, hypoxia EXAM: CT ANGIOGRAPHY CHEST WITH CONTRAST TECHNIQUE: Multidetector CT imaging of the chest was performed using the standard protocol during bolus administration of intravenous contrast. Multiplanar CT image reconstructions and MIPs were obtained to evaluate the vascular anatomy. CONTRAST:  100 cc Isovue 370 IV COMPARISON:  Chest x-ray 04/13/2017 FINDINGS: Cardiovascular: No filling defects in the pulmonary arteries to suggest pulmonary emboli. Heart is normal size. Aorta is normal caliber. Mediastinum/Nodes: No mediastinal, hilar, or axillary adenopathy. Trachea and esophagus are unremarkable. Lungs/Pleura: Ground-glass opacities are noted in both upper lobes. 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 No effusions. Upper Abdomen: Imaging into the upper abdomen shows no acute findings. Musculoskeletal: Chest wall soft tissues are unremarkable. No acute bony abnormality. Review of the MIP images confirms  the above findings. IMPRESSION: No evidence of pulmonary embolus. 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 For Electronically Signed   By: Charlett Nose M.D.   On: 04/16/2017 17:42    Scheduled Meds: . atorvastatin  40 mg Oral QHS  . benztropine  2 mg Oral BID  . docusate sodium  100 mg Oral BID  . enoxaparin (LOVENOX) injection  40 mg Subcutaneous QHS  . gabapentin  300 mg Oral QHS  . guaiFENesin  600 mg Oral BID  . insulin aspart  0-15 Units Subcutaneous TID WC  . ipratropium-albuterol  3 mL Nebulization QID  . lurasidone  80 mg Oral QHS  . mouth rinse  15 mL Mouth Rinse q12n4p  . methylPREDNISolone (SOLU-MEDROL) injection  80 mg Intravenous BID  . sodium chloride flush  3 mL Intravenous Q12H  . Vilazodone HCl  40 mg Oral Daily  . zolpidem  5 mg Oral QHS   Continuous  Infusions: . doxycycline (VIBRAMYCIN) IV Stopped (04/17/17 0045)     LOS: 4 days    Time spent>  Lakendria Nicastro, MD PhD [facility] Pager [phone]  If 7PM-7AM, please contact night-coverage [email or web address] Password [facility] 04/17/2017, 7:24 AM

## 2017-04-18 LAB — BASIC METABOLIC PANEL
Anion gap: 7 (ref 5–15)
BUN: 14 mg/dL (ref 6–20)
CO2: 30 mmol/L (ref 22–32)
Calcium: 9 mg/dL (ref 8.9–10.3)
Chloride: 103 mmol/L (ref 101–111)
Creatinine, Ser: 0.69 mg/dL (ref 0.44–1.00)
GFR calc Af Amer: >60 mL/min (ref >60)
GFR calc non Af Amer: >60 mL/min (ref >60)
Glucose, Bld: 160 mg/dL — ABNORMAL HIGH (ref 65–99)
Potassium: 3.9 mmol/L (ref 3.5–5.1)
Sodium: 140 mmol/L (ref 135–145)

## 2017-04-18 LAB — GLUCOSE, CAPILLARY
Glucose-Capillary: 153 mg/dL — ABNORMAL HIGH (ref 65–99)
Glucose-Capillary: 160 mg/dL — ABNORMAL HIGH (ref 65–99)
Glucose-Capillary: 143 mg/dL — ABNORMAL HIGH (ref 65–99)

## 2017-04-18 MED ORDER — ALUM & MAG HYDROXIDE-SIMETH 200-200-20 MG/5ML PO SUSP
15.0000 mL | ORAL | Status: DC | PRN
  Administered 2017-04-18: 15 mL via ORAL
  Filled 2017-04-18: qty 30

## 2017-04-18 MED ORDER — ALUM HYDROXIDE-MAG CARBONATE 95-358 MG/15ML PO SUSP
15.0000 mL | ORAL | Status: DC | PRN
  Filled 2017-04-18: qty 15

## 2017-04-18 MED ORDER — IPRATROPIUM-ALBUTEROL 0.5-2.5 (3) MG/3ML IN SOLN
3.0000 mL | Freq: Four times a day (QID) | RESPIRATORY_TRACT | Status: DC
  Administered 2017-04-18 – 2017-04-19 (×3): 3 mL via RESPIRATORY_TRACT
  Filled 2017-04-18 (×3): qty 3

## 2017-04-18 MED ORDER — FLUCONAZOLE 100 MG PO TABS
100.0000 mg | ORAL_TABLET | Freq: Every day | ORAL | Status: AC
  Administered 2017-04-18 – 2017-04-21 (×4): 100 mg via ORAL
  Filled 2017-04-18 (×4): qty 1

## 2017-04-18 MED ORDER — ORAL CARE MOUTH RINSE
15.0000 mL | Freq: Two times a day (BID) | OROMUCOSAL | Status: DC
  Administered 2017-04-19 – 2017-04-22 (×5): 15 mL via OROMUCOSAL

## 2017-04-18 MED ORDER — SODIUM CHLORIDE 3 % IN NEBU
4.0000 mL | INHALATION_SOLUTION | Freq: Two times a day (BID) | RESPIRATORY_TRACT | Status: DC
  Filled 2017-04-18: qty 4

## 2017-04-18 MED ORDER — IPRATROPIUM-ALBUTEROL 0.5-2.5 (3) MG/3ML IN SOLN
3.0000 mL | Freq: Three times a day (TID) | RESPIRATORY_TRACT | Status: DC
  Administered 2017-04-18 (×2): 3 mL via RESPIRATORY_TRACT
  Filled 2017-04-18 (×2): qty 3

## 2017-04-18 NOTE — Progress Notes (Signed)
PROGRESS NOTE    Judy [name]  KGM:[id number] DOB: 04-Feb-1951 DOA: 04/13/2017 PCP: Doyle Askew, PA    Brief Narrative:  66 y.o. 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  Coughing since Tuesday evening. Nonproductive.  No h/o BIPAP - she does not feel like it is helping her to breathe.  She would like to try being off it.  Low-grade fever recently.   She was seen by her PCP earlier this week and given Augmentin and Prednisone and so has failed outpatient management.   ED Course: Low O2 sats on presentation with acute dyspnea and wheezing.  Given neb and solumedrol and started on BIPAP with improvement.  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 Not able to wean off o2, remain significan wheezing and dyspnea , even at rest,  -mrsa screening negative, respiratory panel + adenovirus,  - she has significant cough, but not productive, continue abx to cover possible superimposed bacteria infection.She did not improve on doxycycline, change abx to zithromax on 7/12 -increase IV Solu-Medrol and scheduled breathing treatments, add hypertonic saline nebs on 7/12 -still has significant diffuse wheezing, but seems start to open up today on 7/13  Oral thrush: Significant oral thrush on exam on 7/12 Denies odynophagia Topical nystatin, oral diflucan  Hyperglycemia -a1c 5.8 -likely due to stress and steroids -Will continue on sliding scale insulin  given concurrent high-dose steroids   Leukocytosis -Suspect secondary to IV steroids -hemodynamically stable   Anxiety /depression Denies SI/HI Continue home meds   DVT prophylaxis: Lovenox subcutaneous Code Status: Full code Family Communication: Patient in room, family not at bedside Disposition Plan: remain significant wheezing, sob , remain in stepdown  Consultants:   none  Procedures:   none  Antimicrobials: Anti-infectives    Start     Dose/Rate Route Frequency Ordered Stop   04/17/17 1230  azithromycin (ZITHROMAX) tablet 500 mg     500 mg Oral Daily 04/17/17 1210     04/17/17 1230  fluconazole (DIFLUCAN) tablet 150 mg     150 mg Oral  Once 04/17/17 1216 04/17/17 1253   04/14/17 0145  doxycycline (VIBRAMYCIN) 100 mg in dextrose 5 % 250 mL IVPB  Status:  Discontinued     100 mg 125 mL/hr over 120 Minutes Intravenous 2 times daily 04/14/17 0125 04/17/17 1209      Subjective: Still wheezing, cough still is non productive, but report feeling better,less oxygen require, less DOE No fever, denies chest pain, no edema Oral thrush has much improved  Objective: Vitals:   04/18/17 0000 04/18/17 0339 04/18/17 0400 04/18/17 0740  BP: (!) 119/54  130/72   Pulse: 64  (!) 55   Resp: 18  16   Temp:  98.6 F (37 C)    TempSrc:  Oral    SpO2: 96%  96% 97%  Weight:      Height:        Intake/Output Summary (Last 24 hours) at 04/18/17 0749 Last data filed at 04/17/17 1400  Gross per 24 hour  Intake  730 ml  Output              875 ml  Net             -145 ml   Filed Weights   04/13/17 1656  Weight: 85.7 kg (189 lb)    Examination: General exam: Awake, laying in bed, looks better, oral thrush better Respiratory system:  resp effort now mormal,persistent bilateral diffuse end-expiratory wheezing Cardiovascular system: regular rate, s1, s2 Gastrointestinal system: Soft, nondistended, positive BS Central nervous system: CN2-12 grossly intact,  strength intact Extremities: Perfused, no clubbing Skin: Normal skin turgor, no notable skin lesions seen Psychiatry: Mood normal // no visual hallucinations    Data Reviewed: I have personally reviewed following labs and imaging studies  CBC:  Recent Labs Lab 04/13/17 1715 04/14/17 0325 04/17/17 0302  WBC 8.9 12.0* 13.7*  NEUTROABS 7.0  --  12.2*  HGB 15.9* 13.1 12.9  HCT 47.0* 39.1 39.1  MCV 90.6 89.9 89.9  PLT 256 203 210   Basic Metabolic Panel:  Recent Labs Lab 04/13/17 1715 04/14/17 0325 04/17/17 0302 04/18/17 0320  NA 138 139 142 140  K 3.9 4.0 4.1 3.9  CL 103 104 105 103  CO2 22 22 31 30   GLUCOSE 146* 219* 156* 160*  BUN 16 17 12 14   CREATININE 1.09* 1.06* 0.63 0.69  CALCIUM 9.5 9.3 9.2 9.0  MG  --   --  2.1  --    GFR: Estimated Creatinine Clearance: 83.5 mL/min (by C-G formula based on SCr of 0.69 mg/dL). Liver Function Tests: No results for input(s): AST, ALT, ALKPHOS, BILITOT, PROT, ALBUMIN in the last 168 hours. No results for input(s): LIPASE, AMYLASE in the last 168 hours. No results for input(s): AMMONIA in the last 168 hours. Coagulation Profile: No results for input(s): INR, PROTIME in the last 168 hours. Cardiac Enzymes:  Recent Labs Lab 04/13/17 1713  TROPONINI <0.03   BNP (last 3 results) No results for input(s): PROBNP in the last 8760 hours. HbA1C: No results for input(s): HGBA1C in the last 72 hours. CBG:  Recent Labs Lab 04/17/17 0841 04/17/17 1217 04/17/17 1559 04/17/17 2121 04/18/17 0726  GLUCAP 131* 163* 137* 230* 153*   Lipid Profile: No results for input(s): CHOL, HDL, LDLCALC, TRIG, CHOLHDL, LDLDIRECT in the last 72 hours. Thyroid Function Tests: No results for input(s): TSH, T4TOTAL, FREET4, T3FREE, THYROIDAB in the last 72 hours. Anemia Panel: No results for input(s): VITAMINB12, FOLATE, FERRITIN, TIBC, IRON, RETICCTPCT in the last 72 hours. Sepsis Labs: No results for input(s): PROCALCITON, LATICACIDVEN in  the last 168 hours.  Recent Results (from the past 240 hour(s))  MRSA PCR Screening     Status: None   Collection Time: 04/13/17 10:54 PM  Result Value Ref Range Status   MRSA by PCR NEGATIVE NEGATIVE Final    Comment:        The GeneXpert MRSA Assay (FDA approved for NASAL specimens only), is one component of a comprehensive MRSA colonization surveillance program. It is not intended to diagnose MRSA infection nor to guide or monitor treatment for MRSA infections.   Respiratory Panel by PCR     Status: Abnormal   Collection Time: 04/16/17  1:45 PM  Result Value Ref Range Status   Adenovirus DETECTED (A) NOT DETECTED Final   Coronavirus 229E NOT DETECTED NOT DETECTED Final   Coronavirus HKU1 NOT DETECTED NOT DETECTED Final   Coronavirus NL63 NOT DETECTED NOT DETECTED Final   Coronavirus OC43 NOT  DETECTED NOT DETECTED Final   Metapneumovirus NOT DETECTED NOT DETECTED Final   Rhinovirus / Enterovirus NOT DETECTED NOT DETECTED Final   Influenza A NOT DETECTED NOT DETECTED Final   Influenza B NOT DETECTED NOT DETECTED Final   Parainfluenza Virus 1 NOT DETECTED NOT DETECTED Final   Parainfluenza Virus 2 NOT DETECTED NOT DETECTED Final   Parainfluenza Virus 3 NOT DETECTED NOT DETECTED Final   Parainfluenza Virus 4 NOT DETECTED NOT DETECTED Final   Respiratory Syncytial Virus NOT DETECTED NOT DETECTED Final   Bordetella pertussis NOT DETECTED NOT DETECTED Final   Chlamydophila pneumoniae NOT DETECTED NOT DETECTED Final   Mycoplasma pneumoniae NOT DETECTED NOT DETECTED Final    Comment: Performed at [facility], [address], [place], [place] [address]     Radiology Studies: Ct Angio Chest Pe W Or Wo Contrast  Result Date: 04/16/2017 CLINICAL DATA:  O cough, respiratory distress, hypoxia EXAM: CT ANGIOGRAPHY CHEST WITH CONTRAST TECHNIQUE: Multidetector CT imaging of the chest was performed using the standard protocol during bolus administration of intravenous contrast.  Multiplanar CT image reconstructions and MIPs were obtained to evaluate the vascular anatomy. CONTRAST:  100 cc Isovue 370 IV COMPARISON:  Chest x-ray 04/13/2017 FINDINGS: Cardiovascular: No filling defects in the pulmonary arteries to suggest pulmonary emboli. Heart is normal size. Aorta is normal caliber. Mediastinum/Nodes: No mediastinal, hilar, or axillary adenopathy. Trachea and esophagus are unremarkable. Lungs/Pleura: Ground-glass opacities are noted in both upper lobes. 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 No effusions. Upper Abdomen: Imaging into the upper abdomen shows no acute findings. Musculoskeletal: Chest wall soft tissues are unremarkable. No acute bony abnormality. Review of the MIP images confirms the above findings. IMPRESSION: No evidence of pulmonary embolus. 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 For Electronically Signed   By: Charlett Nose M.D.   On: 04/16/2017 17:42    Scheduled Meds: . atorvastatin  40 mg Oral QHS  . azithromycin  500 mg Oral Daily  . benztropine  2 mg Oral BID  . docusate sodium  100 mg Oral BID  . enoxaparin (LOVENOX) injection  40 mg Subcutaneous QHS  . gabapentin  300 mg Oral QHS  . guaiFENesin  600 mg Oral BID  . insulin aspart  0-15 Units Subcutaneous TID WC  . ipratropium-albuterol  3 mL Nebulization TID  . lurasidone  80 mg Oral QHS  . mouth rinse  15 mL Mouth Rinse q12n4p  . methylPREDNISolone (SOLU-MEDROL) injection  60 mg Intravenous Q6H  . nystatin  5 mL Oral QID  . sodium chloride flush  3 mL Intravenous Q12H  . sodium chloride HYPERTONIC  4 mL Nebulization BID  . Vilazodone HCl  40 mg Oral Daily  . zolpidem  5 mg Oral QHS   Continuous Infusions:    LOS: 5 days    Time spent>  Weyman Bogdon, MD PhD [facility] Pager [phone]  If 7PM-7AM, please contact night-coverage [email or web address] Password  [facility] 04/18/2017, 7:49 AM

## 2017-04-19 LAB — GLUCOSE, CAPILLARY
Glucose-Capillary: 211 mg/dL — ABNORMAL HIGH (ref 65–99)
Glucose-Capillary: 171 mg/dL — ABNORMAL HIGH (ref 65–99)
Glucose-Capillary: 159 mg/dL — ABNORMAL HIGH (ref 65–99)
Glucose-Capillary: 251 mg/dL — ABNORMAL HIGH (ref 65–99)

## 2017-04-19 MED ORDER — IPRATROPIUM-ALBUTEROL 0.5-2.5 (3) MG/3ML IN SOLN
3.0000 mL | Freq: Four times a day (QID) | RESPIRATORY_TRACT | Status: DC
  Administered 2017-04-19 – 2017-04-20 (×5): 3 mL via RESPIRATORY_TRACT
  Filled 2017-04-19 (×5): qty 3

## 2017-04-19 MED ORDER — SODIUM CHLORIDE 3 % IN NEBU
4.0000 mL | INHALATION_SOLUTION | Freq: Two times a day (BID) | RESPIRATORY_TRACT | Status: DC
  Administered 2017-04-19: 4 mL via RESPIRATORY_TRACT

## 2017-04-19 MED ORDER — SODIUM CHLORIDE 0.9 % IN NEBU
3.0000 mL | INHALATION_SOLUTION | Freq: Four times a day (QID) | RESPIRATORY_TRACT | Status: DC
  Administered 2017-04-19 – 2017-04-21 (×8): 3 mL via RESPIRATORY_TRACT
  Filled 2017-04-19 (×11): qty 3

## 2017-04-19 MED ORDER — SODIUM CHLORIDE 0.9% FLUSH
3.0000 mL | Freq: Two times a day (BID) | INTRAVENOUS | Status: DC
  Administered 2017-04-19 – 2017-04-22 (×5): 3 mL via INTRAVENOUS

## 2017-04-19 MED ORDER — IPRATROPIUM-ALBUTEROL 0.5-2.5 (3) MG/3ML IN SOLN
3.0000 mL | Freq: Three times a day (TID) | RESPIRATORY_TRACT | Status: DC
  Administered 2017-04-19: 3 mL via RESPIRATORY_TRACT
  Filled 2017-04-19: qty 3

## 2017-04-19 MED ORDER — METHYLPREDNISOLONE SODIUM SUCC 125 MG IJ SOLR
60.0000 mg | Freq: Three times a day (TID) | INTRAMUSCULAR | Status: DC
  Administered 2017-04-19 – 2017-04-20 (×3): 60 mg via INTRAVENOUS
  Filled 2017-04-19 (×3): qty 2

## 2017-04-19 MED ORDER — SODIUM CHLORIDE 0.9 % IN NEBU
3.0000 mL | INHALATION_SOLUTION | Freq: Three times a day (TID) | RESPIRATORY_TRACT | Status: DC
  Filled 2017-04-19 (×2): qty 3

## 2017-04-19 NOTE — Progress Notes (Signed)
Oxygen level at 2 L/Judy [name] was 97%. Weaned to RA. Will continue to monitor.

## 2017-04-19 NOTE — Progress Notes (Signed)
PROGRESS NOTE    Judy [name]  ZOX:[id number] DOB: June 25, 1951 DOA: 04/13/2017 PCP: Doyle Askew, PA    Brief Narrative:  66 y.o. 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  Coughing since Tuesday evening. Nonproductive.  No h/o BIPAP - she does not feel like it is helping her to breathe.  She would like to try being off it.  Low-grade fever recently.   She was seen by her PCP earlier this week and given Augmentin and Prednisone and so has failed outpatient management.   ED Course: Low O2 sats on presentation with acute dyspnea and wheezing.  Given neb and solumedrol and started on BIPAP with improvement.  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 Not able to wean off o2, remain significan wheezing and dyspnea , even at rest,  -mrsa screening negative, respiratory panel + adenovirus,  - she has significant cough, but not productive, continue abx to cover possible superimposed bacteria infection.She did not improve on doxycycline, change abx to zithromax on 7/12 -increased IV Solu-Medrol and scheduled breathing treatments, add saline nebs on 7/12 -start to improve from 7/13, decrease iv solumedrol from q6hr to q8hrs, decrease nebs from q6hrs to q8hrs on 7/14 , continue saline nebs, wean oxygen, keep o2 sats around low 90's  Oral thrush: Significant oral thrush on exam on 7/12 Denies odynophagia Topical nystatin, oral  diflucan  Hyperglycemia -a1c 5.8 -likely due to stress and steroids -Will continue on sliding scale insulin given concurrent high-dose steroids   Leukocytosis -Suspect secondary to IV steroids -hemodynamically stable   Anxiety /depression Denies SI/HI Continue home meds   DVT prophylaxis: Lovenox subcutaneous Code Status: Full code Family Communication: Patient in room, family not at bedside Disposition Plan: improving, now on med surg floor, hopefully home in 1-2 days  Consultants:   none  Procedures:   none  Antimicrobials: Anti-infectives    Start     Dose/Rate Route Frequency Ordered Stop   04/18/17 1615  fluconazole (DIFLUCAN) tablet 100 mg     100 mg Oral Daily 04/18/17 1604 04/22/17 0959   04/17/17 1230  azithromycin (ZITHROMAX) tablet 500 mg     500 mg Oral Daily 04/17/17 1210     04/17/17 1230  fluconazole (DIFLUCAN) tablet 150 mg     150 mg Oral  Once 04/17/17 1216 04/17/17 1253   04/14/17 0145  doxycycline (VIBRAMYCIN) 100 mg in dextrose 5 % 250 mL IVPB  Status:  Discontinued     100 mg 125 mL/hr over 120 Minutes Intravenous 2 times daily 04/14/17 0125 04/17/17 1209      Subjective: cough still is non productive, less wheezing, less DOE No fever, denies chest pain, no edema Oral thrush has much improved She report more anxiety attacks  Objective: Vitals:   04/18/17 2115 04/19/17 0106 04/19/17 0541 04/19/17 0728  BP: (!) 160/82  128/76   Pulse: 71  65   Resp: (!) 22  20   Temp: 98.9 F (37.2 C)  98 F (36.7 C)   TempSrc: Oral  Oral  SpO2: 93% 98% 96% 92%  Weight:      Height:        Intake/Output Summary (Last 24 hours) at 04/19/17 1155 Last data filed at 04/19/17 0841  Gross per 24 hour  Intake             1260 ml  Output                0 ml  Net             1260 ml   Filed Weights   04/13/17 1656  Weight: 85.7 kg (189 lb)    Examination: General exam: Awake, laying in bed, looks better, oral thrush has resolved Respiratory  system:  Still DOE, less bilateral diffuse end-expiratory wheezing, improved aeration  Cardiovascular system: regular rate, s1, s2 Gastrointestinal system: Soft, nondistended, positive BS Central nervous system: CN2-12 grossly intact, strength intact Extremities: Perfused, no clubbing Skin: Normal skin turgor, no notable skin lesions seen Psychiatry: Mood normal // no visual hallucinations    Data Reviewed: I have personally reviewed following labs and imaging studies  CBC:  Recent Labs Lab 04/13/17 1715 04/14/17 0325 04/17/17 0302  WBC 8.9 12.0* 13.7*  NEUTROABS 7.0  --  12.2*  HGB 15.9* 13.1 12.9  HCT 47.0* 39.1 39.1  MCV 90.6 89.9 89.9  PLT 256 203 210   Basic Metabolic Panel:  Recent Labs Lab 04/13/17 1715 04/14/17 0325 04/17/17 0302 04/18/17 0320  NA 138 139 142 140  K 3.9 4.0 4.1 3.9  CL 103 104 105 103  CO2 22 22 31 30   GLUCOSE 146* 219* 156* 160*  BUN 16 17 12 14   CREATININE 1.09* 1.06* 0.63 0.69  CALCIUM 9.5 9.3 9.2 9.0  MG  --   --  2.1  --    GFR: Estimated Creatinine Clearance: 83.5 mL/min (by C-G formula based on SCr of 0.69 mg/dL). Liver Function Tests: No results for input(s): AST, ALT, ALKPHOS, BILITOT, PROT, ALBUMIN in the last 168 hours. No results for input(s): LIPASE, AMYLASE in the last 168 hours. No results for input(s): AMMONIA in the last 168 hours. Coagulation Profile: No results for input(s): INR, PROTIME in the last 168 hours. Cardiac Enzymes:  Recent Labs Lab 04/13/17 1713  TROPONINI <0.03   BNP (last 3 results) No results for input(s): PROBNP in the last 8760 hours. HbA1C: No results for input(s): HGBA1C in the last 72 hours. CBG:  Recent Labs Lab 04/18/17 0726 04/18/17 1115 04/18/17 1524 04/19/17 0830 04/19/17 1150  GLUCAP 153* 160* 143* 211* 171*   Lipid Profile: No results for input(s): CHOL, HDL, LDLCALC, TRIG, CHOLHDL, LDLDIRECT in the last 72 hours. Thyroid Function Tests: No results for input(s): TSH,  T4TOTAL, FREET4, T3FREE, THYROIDAB in the last 72 hours. Anemia Panel: No results for input(s): VITAMINB12, FOLATE, FERRITIN, TIBC, IRON, RETICCTPCT in the last 72 hours. Sepsis Labs: No results for input(s): PROCALCITON, LATICACIDVEN in the last 168 hours.  Recent Results (from the past 240 hour(s))  MRSA PCR Screening     Status: None   Collection Time: 04/13/17 10:54 PM  Result Value Ref Range Status   MRSA by PCR NEGATIVE NEGATIVE Final    Comment:        The GeneXpert MRSA Assay (FDA approved for NASAL specimens only), is one component of a comprehensive MRSA colonization surveillance program. It is not intended to diagnose MRSA infection nor to guide or monitor treatment for MRSA infections.   Respiratory Panel by PCR  Status: Abnormal   Collection Time: 04/16/17  1:45 PM  Result Value Ref Range Status   Adenovirus DETECTED (A) NOT DETECTED Final   Coronavirus 229E NOT DETECTED NOT DETECTED Final   Coronavirus HKU1 NOT DETECTED NOT DETECTED Final   Coronavirus NL63 NOT DETECTED NOT DETECTED Final   Coronavirus OC43 NOT DETECTED NOT DETECTED Final   Metapneumovirus NOT DETECTED NOT DETECTED Final   Rhinovirus / Enterovirus NOT DETECTED NOT DETECTED Final   Influenza A NOT DETECTED NOT DETECTED Final   Influenza B NOT DETECTED NOT DETECTED Final   Parainfluenza Virus 1 NOT DETECTED NOT DETECTED Final   Parainfluenza Virus 2 NOT DETECTED NOT DETECTED Final   Parainfluenza Virus 3 NOT DETECTED NOT DETECTED Final   Parainfluenza Virus 4 NOT DETECTED NOT DETECTED Final   Respiratory Syncytial Virus NOT DETECTED NOT DETECTED Final   Bordetella pertussis NOT DETECTED NOT DETECTED Final   Chlamydophila pneumoniae NOT DETECTED NOT DETECTED Final   Mycoplasma pneumoniae NOT DETECTED NOT DETECTED Final    Comment: Performed at [facility], [address] [place], [place] [address]     Radiology Studies: No results found.  Scheduled Meds: . atorvastatin  40 mg  Oral QHS  . azithromycin  500 mg Oral Daily  . benztropine  2 mg Oral BID  . docusate sodium  100 mg Oral BID  . enoxaparin (LOVENOX) injection  40 mg Subcutaneous QHS  . fluconazole  100 mg Oral Daily  . gabapentin  300 mg Oral QHS  . guaiFENesin  600 mg Oral BID  . insulin aspart  0-15 Units Subcutaneous TID WC  . ipratropium-albuterol  3 mL Nebulization Q6H  . lurasidone  80 mg Oral QHS  . mouth rinse  15 mL Mouth Rinse BID  . methylPREDNISolone (SOLU-MEDROL) injection  60 mg Intravenous Q6H  . nystatin  5 mL Oral QID  . sodium chloride flush  3 mL Intravenous Q12H  . sodium chloride HYPERTONIC  4 mL Nebulization BID  . Vilazodone HCl  40 mg Oral Daily  . zolpidem  5 mg Oral QHS   Continuous Infusions:    LOS: 6 days    Time spent> 35mins  Avigayil Ton, MD PhD [facility] Pager [phone]  If 7PM-7AM, please contact night-coverage [email or web address] Password [email or web address] 04/19/2017, 11:55 AM

## 2017-04-19 NOTE — Progress Notes (Signed)
Patient oxygen level was checked by NT,it was 96% on RA.

## 2017-04-19 NOTE — Progress Notes (Signed)
Patient had an episode of anxiety earlier,Xanax po given, slept. Oxygen weaned to 2L/[place] from 4L. Will recheck oxygen level and will try to wean to RA as per MD order.Will continue to monitor.

## 2017-04-20 DIAGNOSIS — F17210 Nicotine dependence, cigarettes, uncomplicated: Secondary | ICD-10-CM

## 2017-04-20 LAB — MAGNESIUM: Magnesium: 2.5 mg/dL — ABNORMAL HIGH (ref 1.7–2.4)

## 2017-04-20 LAB — BASIC METABOLIC PANEL
Anion gap: 8 (ref 5–15)
BUN: 18 mg/dL (ref 6–20)
CO2: 31 mmol/L (ref 22–32)
Calcium: 8.5 mg/dL — ABNORMAL LOW (ref 8.9–10.3)
Chloride: 104 mmol/L (ref 101–111)
Creatinine, Ser: 0.82 mg/dL (ref 0.44–1.00)
GFR calc Af Amer: >60 mL/min (ref >60)
GFR calc non Af Amer: >60 mL/min (ref >60)
Glucose, Bld: 179 mg/dL — ABNORMAL HIGH (ref 65–99)
Potassium: 4.3 mmol/L (ref 3.5–5.1)
Sodium: 143 mmol/L (ref 135–145)

## 2017-04-20 LAB — GLUCOSE, CAPILLARY
Glucose-Capillary: 171 mg/dL — ABNORMAL HIGH (ref 65–99)
Glucose-Capillary: 312 mg/dL — ABNORMAL HIGH (ref 65–99)
Glucose-Capillary: 152 mg/dL — ABNORMAL HIGH (ref 65–99)
Glucose-Capillary: 202 mg/dL — ABNORMAL HIGH (ref 65–99)

## 2017-04-20 MED ORDER — BENZONATATE 100 MG PO CAPS: 100.0000 mg | ORAL_CAPSULE | Freq: Three times a day (TID) | ORAL | Status: DC | PRN

## 2017-04-20 MED ORDER — MONTELUKAST SODIUM 10 MG PO TABS
10.0000 mg | ORAL_TABLET | Freq: Every day | ORAL | Status: DC
  Administered 2017-04-20 – 2017-04-21 (×2): 10 mg via ORAL
  Filled 2017-04-20 (×2): qty 1

## 2017-04-20 MED ORDER — LORAZEPAM 2 MG/ML IJ SOLN
0.5000 mg | Freq: Once | INTRAMUSCULAR | Status: AC
  Administered 2017-04-20: 0.5 mg via INTRAVENOUS
  Filled 2017-04-20: qty 1

## 2017-04-20 MED ORDER — METHYLPREDNISOLONE SODIUM SUCC 125 MG IJ SOLR
60.0000 mg | Freq: Two times a day (BID) | INTRAMUSCULAR | Status: AC
  Administered 2017-04-20 – 2017-04-21 (×3): 60 mg via INTRAVENOUS
  Filled 2017-04-20 (×4): qty 2

## 2017-04-20 NOTE — Progress Notes (Signed)
PROGRESS NOTE    Ednamae Schiano  UEA:[id number] DOB: [birth year] DOA: 04/13/2017 PCP: Doyle Askew, PA    Brief Narrative:  66 y.o. 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  Coughing since Tuesday evening. Nonproductive.  No h/o BIPAP - she does not feel like it is helping her to breathe.  She would like to try being off it.  Low-grade fever recently.   She was seen by her PCP earlier this week and given Augmentin and Prednisone and so has failed outpatient management.   ED Course: Low O2 sats on presentation with acute dyspnea and wheezing.  Given neb and solumedrol and started on BIPAP with improvement.  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 -Patient initially had required BiPAP at time of admission, she has significan wheezing and dyspnea , even at rest initial on presentation, she was admitted to stepdown unit -mrsa screening negative, respiratory panel + adenovirus,  - she has significant cough, but not productive, continue abx to cover possible superimposed bacteria infection.She did not improve on doxycycline, change abx to zithromax on 7/12 -increased IV Solu-Medrol and scheduled breathing treatments, add saline nebs on 7/12 -start to improve from 7/13, decrease iv solumedrol from q6hr to q8hrs, continue nebs , continue saline nebs, wean oxygen, keep o2 sats around low 90's -7/15, she is now off oxygen at rest, remain wheezing/cough but improved aeration,  decrease solumedrol from q8hrs to 12hrs, continue nebs.  Add singulair -She will need to follow up with pulmonology  Oral thrush: Significant oral thrush on exam on 7/12 Denies odynophagia Topical nystatin, oral diflucan  Hyperglycemia -a1c 5.8 -likely due to stress and steroids -Will continue on sliding scale insulin given concurrent high-dose steroids   Leukocytosis -Suspect secondary to IV steroids -hemodynamically stable   Anxiety /depression Denies SI/HI Continue home meds   Smoking cessation education: she admit that it is very difficult for her to quit smoking, she  reports started to smoke since she was 66yrs old,  she is now cutting down to half a pack a day   DVT prophylaxis: Lovenox subcutaneous Code Status: Full code Family Communication: Patient in room, family not at bedside Disposition Plan: improving, she was transferred out from stepdown unit to  med surg floor on 7/13,  hopefully home in 1-2 days  Consultants:   none  Procedures:   none  Antimicrobials: Anti-infectives    Start     Dose/Rate Route Frequency Ordered Stop   04/18/17 1615  fluconazole (DIFLUCAN) tablet 100 mg     100 mg Oral Daily 04/18/17 1604 04/22/17 0959   04/17/17 1230  azithromycin (ZITHROMAX) tablet 500 mg     500 mg Oral Daily 04/17/17 1210     04/17/17 1230  fluconazole (DIFLUCAN) tablet 150 mg     150 mg Oral  Once 04/17/17 1216 04/17/17 1253   04/14/17 0145  doxycycline (VIBRAMYCIN) 100 mg in dextrose 5 % 250 mL IVPB  Status:  Discontinued     100 mg 125 mL/hr over 120 Minutes Intravenous 2  times daily 04/14/17 0125 04/17/17 1209      Subjective: Less cough, still is non productive, less wheezing, less DOE, she is now on room air at rest,  No fever, denies chest pain, no edema She ambulated in hall this am She reports anxiety attacks, she is worried that she might not do well at home if she is discharged to early , she states she lives by herself.    Objective: Vitals:   04/19/17 2044 04/20/17 0537 04/20/17 0737 04/20/17 1109  BP: 126/67 123/73    Pulse: 65 61  90  Resp: 16 20    Temp: 98.5 F (36.9 C) 98.2 F (36.8 C)    TempSrc: Oral Oral    SpO2: 100% 100% 94% 94%  Weight:      Height:        Intake/Output Summary (Last 24 hours) at 04/20/17 1343 Last data filed at 04/20/17 1108  Gross per 24 hour  Intake             1080 ml  Output                0 ml  Net             1080 ml   Filed Weights   04/13/17 1656  Weight: 85.7 kg (189 lb)    Examination: General exam: anxious, oral thrush has resolved Respiratory system:  less DOE, still have bilateral diffuse end-expiratory wheezing, aeration has improved Cardiovascular system: regular rate, s1, s2 Gastrointestinal system: Soft, nondistended, positive BS Central nervous system: CN2-12 grossly intact, strength intact Extremities: Perfused, no clubbing Skin: Normal skin turgor, no notable skin lesions seen Psychiatry: Mood normal // no visual hallucinations    Data Reviewed: I have personally reviewed following labs and imaging studies  CBC:  Recent Labs Lab 04/13/17 1715 04/14/17 0325 04/17/17 0302  WBC 8.9 12.0* 13.7*  NEUTROABS 7.0  --  12.2*  HGB 15.9* 13.1 12.9  HCT 47.0* 39.1 39.1  MCV 90.6 89.9 89.9  PLT 256 203 210   Basic Metabolic Panel:  Recent Labs Lab 04/13/17 1715 04/14/17 0325 04/17/17 0302 04/18/17 0320 04/20/17 0404  NA 138 139 142 140 143  K 3.9 4.0 4.1 3.9 4.3  CL 103 104 105 103 104  CO2 22 22 31 30 31   GLUCOSE 146* 219* 156* 160* 179*  BUN 16 17 12 14 18   CREATININE 1.09* 1.06* 0.63 0.69 0.82  CALCIUM 9.5 9.3 9.2 9.0 8.5*  MG  --   --  2.1  --  2.5*   GFR: Estimated Creatinine Clearance: 81.4 mL/min (by C-G formula based on SCr of 0.82 mg/dL). Liver Function Tests: No results for input(s): AST, ALT, ALKPHOS, BILITOT, PROT, ALBUMIN in the last 168 hours. No results for input(s): LIPASE, AMYLASE in the last 168  hours. No results for input(s): AMMONIA in the last 168 hours. Coagulation Profile: No results for input(s): INR, PROTIME in the last 168 hours. Cardiac Enzymes:  Recent Labs Lab 04/13/17 1713  TROPONINI <0.03   BNP (last 3 results) No results for input(s): PROBNP in the last 8760 hours. HbA1C: No results for input(s): HGBA1C in the last 72 hours. CBG:  Recent Labs Lab 04/19/17 1150 04/19/17 1728 04/19/17 2029 04/20/17 0802 04/20/17 1203  GLUCAP 171* 159* 251* 171* 312*   Lipid Profile: No results for input(s): CHOL, HDL, LDLCALC, TRIG, CHOLHDL, LDLDIRECT in the last 72 hours. Thyroid Function Tests: No results for input(s): TSH, T4TOTAL, FREET4, T3FREE, THYROIDAB  in the last 72 hours. Anemia Panel: No results for input(s): VITAMINB12, FOLATE, FERRITIN, TIBC, IRON, RETICCTPCT in the last 72 hours. Sepsis Labs: No results for input(s): PROCALCITON, LATICACIDVEN in the last 168 hours.  Recent Results (from the past 240 hour(s))  MRSA PCR Screening     Status: None   Collection Time: 04/13/17 10:54 PM  Result Value Ref Range Status   MRSA by PCR NEGATIVE NEGATIVE Final    Comment:        The GeneXpert MRSA Assay (FDA approved for NASAL specimens only), is one component of a comprehensive MRSA colonization surveillance program. It is not intended to diagnose MRSA infection nor to guide or monitor treatment for MRSA infections.   Respiratory Panel by PCR     Status: Abnormal   Collection Time: 04/16/17  1:45 PM  Result Value Ref Range Status   Adenovirus DETECTED (A) NOT DETECTED Final   Coronavirus 229E NOT DETECTED NOT DETECTED Final   Coronavirus HKU1 NOT DETECTED NOT DETECTED Final   Coronavirus NL63 NOT DETECTED NOT DETECTED Final   Coronavirus OC43 NOT DETECTED NOT DETECTED Final   Metapneumovirus NOT DETECTED NOT DETECTED Final   Rhinovirus / Enterovirus NOT DETECTED NOT DETECTED Final   Influenza A NOT DETECTED NOT DETECTED Final   Influenza B NOT  DETECTED NOT DETECTED Final   Parainfluenza Virus 1 NOT DETECTED NOT DETECTED Final   Parainfluenza Virus 2 NOT DETECTED NOT DETECTED Final   Parainfluenza Virus 3 NOT DETECTED NOT DETECTED Final   Parainfluenza Virus 4 NOT DETECTED NOT DETECTED Final   Respiratory Syncytial Virus NOT DETECTED NOT DETECTED Final   Bordetella pertussis NOT DETECTED NOT DETECTED Final   Chlamydophila pneumoniae NOT DETECTED NOT DETECTED Final   Mycoplasma pneumoniae NOT DETECTED NOT DETECTED Final    Comment: Performed at [facility], [address] [place], [place] [address]     Radiology Studies: No results found.  Scheduled Meds: . atorvastatin  40 mg Oral QHS  . azithromycin  500 mg Oral Daily  . benztropine  2 mg Oral BID  . docusate sodium  100 mg Oral BID  . enoxaparin (LOVENOX) injection  40 mg Subcutaneous QHS  . fluconazole  100 mg Oral Daily  . gabapentin  300 mg Oral QHS  . guaiFENesin  600 mg Oral BID  . insulin aspart  0-15 Units Subcutaneous TID WC  . ipratropium-albuterol  3 mL Nebulization Q6H  . lurasidone  80 mg Oral QHS  . mouth rinse  15 mL Mouth Rinse BID  . methylPREDNISolone (SOLU-MEDROL) injection  60 mg Intravenous Q12H  . nystatin  5 mL Oral QID  . sodium chloride  3 mL Nebulization Q6H  . sodium chloride flush  3 mL Intravenous Q12H  . Vilazodone HCl  40 mg Oral Daily  . zolpidem  5 mg Oral QHS   Continuous Infusions:    LOS: 7 days    Time spent>  Bryony Kaman, MD PhD [facility] Pager [phone]  If 7PM-7AM, please contact night-coverage [email or web address] Password [email or web address] 04/20/2017, 1:43 PM

## 2017-04-20 NOTE — Progress Notes (Signed)
Pt ambulated one lap around unit on room air. Oxygen 94%.

## 2017-04-21 LAB — GLUCOSE, CAPILLARY
Glucose-Capillary: 142 mg/dL — ABNORMAL HIGH (ref 65–99)
Glucose-Capillary: 199 mg/dL — ABNORMAL HIGH (ref 65–99)
Glucose-Capillary: 153 mg/dL — ABNORMAL HIGH (ref 65–99)

## 2017-04-21 LAB — BASIC METABOLIC PANEL
Anion gap: 9 (ref 5–15)
BUN: 19 mg/dL (ref 6–20)
CO2: 26 mmol/L (ref 22–32)
Calcium: 8.2 mg/dL — ABNORMAL LOW (ref 8.9–10.3)
Chloride: 106 mmol/L (ref 101–111)
Creatinine, Ser: 0.76 mg/dL (ref 0.44–1.00)
GFR calc Af Amer: >60 mL/min (ref >60)
GFR calc non Af Amer: >60 mL/min (ref >60)
Glucose, Bld: 175 mg/dL — ABNORMAL HIGH (ref 65–99)
Potassium: 4.2 mmol/L (ref 3.5–5.1)
Sodium: 141 mmol/L (ref 135–145)

## 2017-04-21 LAB — T4, FREE: Free T4: 0.76 ng/dL (ref 0.61–1.12)

## 2017-04-21 LAB — TSH: TSH: 0.266 u[IU]/mL — ABNORMAL LOW (ref 0.350–4.500)

## 2017-04-21 MED ORDER — POLYETHYLENE GLYCOL 3350 17 G PO PACK
17.0000 g | PACK | Freq: Every day | ORAL | Status: DC
  Administered 2017-04-21 – 2017-04-22 (×2): 17 g via ORAL
  Filled 2017-04-21 (×2): qty 1

## 2017-04-21 MED ORDER — PREDNISONE 20 MG PO TABS
60.0000 mg | ORAL_TABLET | Freq: Every day | ORAL | Status: DC
  Administered 2017-04-22: 60 mg via ORAL
  Filled 2017-04-21: qty 3

## 2017-04-21 MED ORDER — SENNOSIDES-DOCUSATE SODIUM 8.6-50 MG PO TABS
1.0000 | ORAL_TABLET | Freq: Two times a day (BID) | ORAL | Status: DC
  Administered 2017-04-21 – 2017-04-22 (×2): 1 via ORAL
  Filled 2017-04-21 (×2): qty 1

## 2017-04-21 MED ORDER — IPRATROPIUM-ALBUTEROL 0.5-2.5 (3) MG/3ML IN SOLN
3.0000 mL | Freq: Three times a day (TID) | RESPIRATORY_TRACT | Status: DC
  Administered 2017-04-21 – 2017-04-22 (×4): 3 mL via RESPIRATORY_TRACT
  Filled 2017-04-21 (×4): qty 3

## 2017-04-21 NOTE — Progress Notes (Signed)
PROGRESS NOTE    Judy [name]  ZOX:[id number] DOB: 03-11-1951 DOA: 04/13/2017 PCP: Doyle Askew, PA    Brief Narrative:  66 y.o. 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  Coughing since Tuesday evening. Nonproductive.  No h/o BIPAP - she does not feel like it is helping her to breathe.  She would like to try being off it.  Low-grade fever recently.   She was seen by her PCP earlier this week and given Augmentin and Prednisone and so has failed outpatient management.   ED Course: Low O2 sats on presentation with acute dyspnea and wheezing.  Given neb and solumedrol and started on BIPAP with improvement.  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 -Patient initially had required BiPAP at time of admission, she has significan wheezing and dyspnea , even at rest initial on presentation, she was admitted to stepdown unit -mrsa screening negative, respiratory panel + adenovirus,  - she has significant cough, but not productive, continue abx to cover possible superimposed bacteria infection.She did not improve on doxycycline, change abx to zithromax on 7/12 -increased IV Solu-Medrol and scheduled breathing treatments, add saline nebs on 7/12 -start to improve from 7/13, decrease iv solumedrol from q6hr to q8hrs, continue nebs , continue saline nebs, wean oxygen, keep o2 sats around low 90's -7/15, she is now off oxygen at rest, remain wheezing/cough but improved aeration,  decrease solumedrol from q8hrs to 12hrs, continue nebs.  Add singulair -7/16, Patient has finally started to feel better, Less cough, still is non productive, wheezing has much improved,  DOE has much improved, she is now on room air while ambulating, taper steroids, likely d/c homeo n 7/17 -She will need to follow up with pulmonology  Oral thrush: Significant oral thrush on exam on 7/12 Denies odynophagia Topical nystatin, oral diflucan  Hyperglycemia -a1c 5.8 -likely due to stress and steroids -Will continue on sliding scale insulin given concurrent high-dose steroids   Leukocytosis -Suspect secondary to IV steroids -hemodynamically stable  Suppressed TSH, will check free t3/free t4   Anxiety /depression Denies SI/HI Continue home meds   Smoking cessation education: she admit that it is very difficult for her to quit smoking, she  reports started to smoke since she was 67yrs old,  she is now cutting down to half a pack a day   DVT prophylaxis: Lovenox subcutaneous Code Status: Full code Family Communication: Patient in room, family not at bedside Disposition Plan: improving, she was transferred out from stepdown unit to  med surg floor on 7/13,  hopefully home in 1-2 days  Consultants:   none  Procedures:   none  Antimicrobials: Anti-infectives    Start     Dose/Rate Route Frequency Ordered Stop   04/18/17 1615  fluconazole (DIFLUCAN) tablet 100 mg     100 mg Oral Daily 04/18/17 1604 04/21/17 1039   04/17/17 1230  azithromycin (ZITHROMAX) tablet 500 mg     500 mg Oral Daily 04/17/17 1210     04/17/17 1230  fluconazole (DIFLUCAN) tablet 150 mg  150 mg Oral  Once 04/17/17 1216 04/17/17 1253   04/14/17 0145  doxycycline (VIBRAMYCIN) 100 mg in dextrose 5 % 250 mL IVPB  Status:  Discontinued     100 mg 125 mL/hr over 120 Minutes Intravenous 2 times daily 04/14/17 0125 04/17/17 1209      Subjective: Patient has finally started to feel better, Less cough,  still is non productive, wheezing has much improved,  DOE has much improved, she is now on room air while ambulating,  No fever, denies chest pain, no edema  Objective: Vitals:   04/20/17 2040 04/20/17 2042 04/21/17 0441 04/21/17 0809  BP:  119/76 124/72   Pulse: 88 67 (!) 56 85  Resp: 18 18 18 18   Temp:  98.3 F (36.8 C) 98 F (36.7 C)   TempSrc:  Oral Oral   SpO2: 98% 98% 99% 98%  Weight:      Height:        Intake/Output Summary (Last 24 hours) at 04/21/17 1253 Last data filed at 04/21/17 1029  Gross per 24 hour  Intake              350 ml  Output                0 ml  Net              350 ml   Filed Weights   04/13/17 1656  Weight: 85.7 kg (189 lb)    Examination: General exam: anxious, oral thrush has resolved Respiratory system:  Bilateral diffused wheezing has finally improved, + rhonchi Cardiovascular system: regular rate, s1, s2 Gastrointestinal system: Soft, nondistended, positive BS Central nervous system: CN2-12 grossly intact, strength intact Extremities: Perfused, no clubbing Skin: Normal skin turgor, no notable skin lesions seen Psychiatry: Mood normal // no visual hallucinations    Data Reviewed: I have personally reviewed following labs and imaging studies  CBC:  Recent Labs Lab 04/17/17 0302  WBC 13.7*  NEUTROABS 12.2*  HGB 12.9  HCT 39.1  MCV 89.9  PLT 210   Basic Metabolic Panel:  Recent Labs Lab 04/17/17 0302 04/18/17 0320 04/20/17 0404 04/21/17 0354  NA 142 140 143 141  K 4.1 3.9 4.3 4.2  CL 105 103 104 106  CO2 31 30 31 26   GLUCOSE 156* 160* 179* 175*  BUN 12 14 18 19   CREATININE 0.63 0.69 0.82 0.76  CALCIUM 9.2 9.0 8.5* 8.2*  MG 2.1  --  2.5*  --    GFR: Estimated Creatinine Clearance: 83.5 mL/min (by C-G formula based on SCr of 0.76 mg/dL). Liver Function Tests: No results for input(s): AST, ALT, ALKPHOS, BILITOT, PROT, ALBUMIN in the last 168 hours. No results for input(s): LIPASE, AMYLASE in the last 168 hours. No  results for input(s): AMMONIA in the last 168 hours. Coagulation Profile: No results for input(s): INR, PROTIME in the last 168 hours. Cardiac Enzymes: No results for input(s): CKTOTAL, CKMB, CKMBINDEX, TROPONINI in the last 168 hours. BNP (last 3 results) No results for input(s): PROBNP in the last 8760 hours. HbA1C: No results for input(s): HGBA1C in the last 72 hours. CBG:  Recent Labs Lab 04/20/17 1203 04/20/17 1806 04/20/17 2017 04/21/17 0711 04/21/17 1207  GLUCAP 312* 152* 202* 142* 199*   Lipid Profile: No results for input(s): CHOL, HDL, LDLCALC, TRIG, CHOLHDL, LDLDIRECT in the last 72 hours. Thyroid Function Tests:  Recent Labs  04/21/17 0354  TSH 0.266*   Anemia Panel: No results for input(s): VITAMINB12, FOLATE,  FERRITIN, TIBC, IRON, RETICCTPCT in the last 72 hours. Sepsis Labs: No results for input(s): PROCALCITON, LATICACIDVEN in the last 168 hours.  Recent Results (from the past 240 hour(s))  MRSA PCR Screening     Status: None   Collection Time: 04/13/17 10:54 PM  Result Value Ref Range Status   MRSA by PCR NEGATIVE NEGATIVE Final    Comment:        The GeneXpert MRSA Assay (FDA approved for NASAL specimens only), is one component of a comprehensive MRSA colonization surveillance program. It is not intended to diagnose MRSA infection nor to guide or monitor treatment for MRSA infections.   Respiratory Panel by PCR     Status: Abnormal   Collection Time: 04/16/17  1:45 PM  Result Value Ref Range Status   Adenovirus DETECTED (A) NOT DETECTED Final   Coronavirus 229E NOT DETECTED NOT DETECTED Final   Coronavirus HKU1 NOT DETECTED NOT DETECTED Final   Coronavirus NL63 NOT DETECTED NOT DETECTED Final   Coronavirus OC43 NOT DETECTED NOT DETECTED Final   Metapneumovirus NOT DETECTED NOT DETECTED Final   Rhinovirus / Enterovirus NOT DETECTED NOT DETECTED Final   Influenza A NOT DETECTED NOT DETECTED Final   Influenza B NOT DETECTED NOT DETECTED  Final   Parainfluenza Virus 1 NOT DETECTED NOT DETECTED Final   Parainfluenza Virus 2 NOT DETECTED NOT DETECTED Final   Parainfluenza Virus 3 NOT DETECTED NOT DETECTED Final   Parainfluenza Virus 4 NOT DETECTED NOT DETECTED Final   Respiratory Syncytial Virus NOT DETECTED NOT DETECTED Final   Bordetella pertussis NOT DETECTED NOT DETECTED Final   Chlamydophila pneumoniae NOT DETECTED NOT DETECTED Final   Mycoplasma pneumoniae NOT DETECTED NOT DETECTED Final    Comment: Performed at [facility], [address], [place], [place] [address]     Radiology Studies: No results found.  Scheduled Meds: . atorvastatin  40 mg Oral QHS  . azithromycin  500 mg Oral Daily  . benztropine  2 mg Oral BID  . enoxaparin (LOVENOX) injection  40 mg Subcutaneous QHS  . gabapentin  300 mg Oral QHS  . guaiFENesin  600 mg Oral BID  . insulin aspart  0-15 Units Subcutaneous TID WC  . ipratropium-albuterol  3 mL Nebulization TID  . lurasidone  80 mg Oral QHS  . mouth rinse  15 mL Mouth Rinse BID  . methylPREDNISolone (SOLU-MEDROL) injection  60 mg Intravenous Q12H  . montelukast  10 mg Oral QHS  . nystatin  5 mL Oral QID  . polyethylene glycol  17 g Oral Daily  . [START ON 04/22/2017] predniSONE  60 mg Oral Q breakfast  . senna-docusate  1 tablet Oral BID  . sodium chloride  3 mL Nebulization Q6H  . sodium chloride flush  3 mL Intravenous Q12H  . Vilazodone HCl  40 mg Oral Daily  . zolpidem  5 mg Oral QHS   Continuous Infusions:    LOS: 8 days    Time spent>  Seleste Tallman, MD PhD [facility] Pager [phone]  If 7PM-7AM, please contact night-coverage [email or web address] Password [facility] 04/21/2017, 12:53 PM

## 2017-04-21 NOTE — Care Management Note (Signed)
Case Management Note  Patient Details  Name: Judy [name] MRN: [id number] Date of Birth: 12-02-1950  Subjective/Objective:     66 yo admitted with acute on chronic respiratory failure.               Action/Plan: From home alone. PT recommendations gone over with pt at bedside. Choice offered for home health services and [facility] chosen. [facility] rep alerted of referral. Pt states she has no equipment needs.  Expected Discharge Date:  04/17/17               Expected Discharge Plan:  Home w Home Health Services  In-House Referral:     Discharge planning Services  CM Consult  Post Acute Care Choice:  Home Health Choice offered to:  Patient  DME Arranged:    DME Agency:     HH Arranged:  PT, RN [facility]  Status of Service:  In process, will continue to follow  If discussed at Long Length of Stay Meetings, dates discussed:    Additional CommentsBartholome Bill:  Judy Hohler H, RN 04/21/2017, 1:57 PM  [phone]

## 2017-04-22 LAB — BASIC METABOLIC PANEL
Anion gap: 9 (ref 5–15)
BUN: 15 mg/dL (ref 6–20)
CO2: 27 mmol/L (ref 22–32)
Calcium: 8.4 mg/dL — ABNORMAL LOW (ref 8.9–10.3)
Chloride: 104 mmol/L (ref 101–111)
Creatinine, Ser: 0.7 mg/dL (ref 0.44–1.00)
GFR calc Af Amer: >60 mL/min (ref >60)
GFR calc non Af Amer: >60 mL/min (ref >60)
Glucose, Bld: 160 mg/dL — ABNORMAL HIGH (ref 65–99)
Potassium: 4.3 mmol/L (ref 3.5–5.1)
Sodium: 140 mmol/L (ref 135–145)

## 2017-04-22 LAB — CBC WITH DIFFERENTIAL/PLATELET
Basophils Absolute: 0 10*3/uL (ref 0.0–0.1)
Basophils Relative: 0 %
Eosinophils Absolute: 0 10*3/uL (ref 0.0–0.7)
Eosinophils Relative: 0 %
HCT: 38.7 % (ref 36.0–46.0)
Hemoglobin: 12.9 g/dL (ref 12.0–15.0)
Lymphocytes Relative: 6 %
Lymphs Abs: 1 10*3/uL (ref 0.7–4.0)
MCH: 29.8 pg (ref 26.0–34.0)
MCHC: 33.3 g/dL (ref 30.0–36.0)
MCV: 89.4 fL (ref 78.0–100.0)
Monocytes Absolute: 0.5 10*3/uL (ref 0.1–1.0)
Monocytes Relative: 3 %
Neutro Abs: 15.8 10*3/uL — ABNORMAL HIGH (ref 1.7–7.7)
Neutrophils Relative %: 91 %
Platelets: 207 10*3/uL (ref 150–400)
RBC: 4.33 MIL/uL (ref 3.87–5.11)
RDW: 13.4 % (ref 11.5–15.5)
WBC: 17.3 10*3/uL — ABNORMAL HIGH (ref 4.0–10.5)

## 2017-04-22 LAB — GLUCOSE, CAPILLARY: Glucose-Capillary: 126 mg/dL — ABNORMAL HIGH (ref 65–99)

## 2017-04-22 LAB — MAGNESIUM: Magnesium: 2.3 mg/dL (ref 1.7–2.4)

## 2017-04-22 MED ORDER — SENNOSIDES-DOCUSATE SODIUM 8.6-50 MG PO TABS: 1.0000 | ORAL_TABLET | Freq: Every day | ORAL | 0 refills | Status: DC

## 2017-04-22 MED ORDER — GUAIFENESIN ER 600 MG PO TB12: 600.0000 mg | ORAL_TABLET | Freq: Two times a day (BID) | ORAL | 0 refills | Status: DC

## 2017-04-22 MED ORDER — PREDNISONE 10 MG PO TABS: ORAL_TABLET | ORAL | 0 refills | Status: DC

## 2017-04-22 MED ORDER — MONTELUKAST SODIUM 10 MG PO TABS: 10.0000 mg | ORAL_TABLET | Freq: Every day | ORAL | 0 refills | Status: DC

## 2017-04-22 MED ORDER — IPRATROPIUM-ALBUTEROL 0.5-2.5 (3) MG/3ML IN SOLN: 3.0000 mL | Freq: Four times a day (QID) | RESPIRATORY_TRACT | 0 refills | Status: DC | PRN

## 2017-04-22 MED ORDER — POLYETHYLENE GLYCOL 3350 17 G PO PACK: 17.0000 g | PACK | Freq: Every day | ORAL | 0 refills | Status: DC

## 2017-04-22 MED ORDER — SODIUM CHLORIDE 0.9 % IN NEBU
3.0000 mL | INHALATION_SOLUTION | Freq: Three times a day (TID) | RESPIRATORY_TRACT | Status: DC
  Administered 2017-04-22: 3 mL via RESPIRATORY_TRACT
  Filled 2017-04-22 (×2): qty 3

## 2017-04-22 NOTE — Progress Notes (Signed)
Patient d/c home with daughter. Wheeled by NT. Denies pain. Stable. Discharge instructions given and also prescription. All questions answered.Verbalized understanding on the d/c orders.

## 2017-04-22 NOTE — Discharge Summary (Signed)
Discharge Summary  Judy [name] ZOX:[id number] DOB: 19-Nov-1950  PCP: Doyle Askew, PA  Admit date: 04/13/2017 Discharge date: 04/22/2017  Time spent: >43mins, more than 50% time spent of coordination of care and patient's counseling   Recommendations for Outpatient Follow-up:  1. F/u with PMD within a week  for hospital discharge follow up, repeat cbc/bmp at follow up 2. Establish care with pulmonology 3. Home health arranged  Discharge Diagnoses:  Active Hospital Problems   Diagnosis Date Noted  . Acute on chronic respiratory failure with hypoxia (HCC) 04/13/2017  . COPD with acute exacerbation (HCC) 04/14/2017  . Hyperglycemia 04/14/2017    Resolved Hospital Problems   Diagnosis Date Noted Date Resolved  No resolved problems to display.    Discharge Condition: stable  Diet recommendation: regular diet  Filed Weights   04/13/17 1656  Weight: 85.7 kg (189 lb)    History of present illness:  PCP: Doyle Askew, PA Consultants:  psychiatry Patient coming from: Home - lives alone; NOK: daughter, [phone]  Chief Complaint: SOB  HPI: Judy [name] is a 66 y.o. 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  Coughing since Tuesday evening. Nonproductive.  No h/o BIPAP - she does not feel like it is helping her to breathe.  She would like to try being off it.  Low-grade fever recently.   She was seen by her PCP earlier this week and given Augmentin and Prednisone and so has failed outpatient management.   ED Course: Low O2 sats on presentation with acute dyspnea and wheezing.  Given neb and solumedrol and started on BIPAP with improvement.   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 -Patient initially had required BiPAP at time of admission, she has significan wheezing and dyspnea , even at rest initial on presentation, she was admitted to stepdown unit -mrsa screening negative, respiratory panel + adenovirus,  - she has significant cough, but not productive, continue abx to cover possible superimposed bacteria infection.She did not improve on doxycycline, change abx to zithromax on 7/12 -increased IV Solu-Medrol and scheduled breathing treatments, add saline nebs on 7/12 -start to improve from 7/13, decrease iv solumedrol from q6hr to q8hrs, continue nebs , continue saline nebs, wean oxygen, keep o2 sats around low 90's -7/15, she is now off oxygen at rest, remain wheezing/cough but improved aeration, decrease solumedrol from q8hrs to 12hrs, continue nebs.  Add singulair -7/16, Patient has finally started to feel better, Less cough, still is non productive, wheezing has much improved,  DOE has much improved, she is now on room air while ambulating, taper steroids -7/17, still has some scatter wheezing, cough seems has resolved, she ambulated, DOE has resolved, she is discharged on steroid taper, singular, nebs, She will need to follow up with pulmonology closely.  Oral thrush: Significant oral thrush on exam on 7/12 Denies odynophagia Topical nystatin, oral diflucan resolved  Hyperglycemia -a1c 5.8 -am blood glucose range from 160-179 -likely due to stress and steroids - on sliding scale insulin while on high-dose steroids -expect improvement with  steroids taper, follow up with pmd.   Leukocytosis -Suspect secondary to IV steroids -hemodynamically stable  Suppressed TSH,  tsh 0.266, free t4  0.76 wnl,     Anxiety /depression Denies SI/HI Continue home meds   Smoking cessation education: she admit that it is very difficult for her to quit smoking, she  reports started to smoke since she was 66yrs old,  she is now cutting down to half a pack a day   DVT prophylaxis while in the hospital: Lovenox subcutaneous Code Status: Full code Family Communication: Patient in room, family not at bedside Disposition Plan: home   Consultants:   none  Procedures:   bipap  Antimicrobials:            Anti-infectives     Start        Dose/Rate  Route  Frequency  Ordered  Stop    04/18/17 1615   fluconazole (DIFLUCAN) tablet 100 mg      100 mg  Oral  Daily  04/18/17 1604  04/21/17 1039    04/17/17 1230   azithromycin (ZITHROMAX) tablet 500 mg      500 mg  Oral  Daily  04/17/17 1210       04/17/17 1230   fluconazole (DIFLUCAN) tablet 150 mg      150 mg  Oral   Once  04/17/17 1216  04/17/17 1253    04/14/17 0145   doxycycline (VIBRAMYCIN) 100 mg in dextrose 5 % 250 mL IVPB  Status:  Discontinued      100 mg  125 mL/hr over 120 Minutes  Intravenous  2 times daily  04/14/17 0125  04/17/17 1209      Discharge Exam: BP 130/88 (BP Location: Left Arm)   Pulse (!) 55   Temp 98.2 F (36.8 C) (Oral)   Resp 18   Ht 5\' 10"  (1.778 m)   Wt 85.7 kg (189 lb)   SpO2 94%   BMI 27.12 kg/m   General: anxious, oral thrush has resolved Cardiovascular: RRR Respiratory: severe diffuse bilateral wheezing has much improved,. She has improved aeration, some scatter wheezing, rhonchi has resolved.  Discharge Instructions You were cared for by a hospitalist during your hospital stay. If you have any questions about your discharge medications or the care you received while you were in the hospital after you are discharged, you can call the unit and asked to speak with the hospitalist on call if the hospitalist that took care of you is not available. Once you  are discharged, your primary care physician will handle any further medical issues. Please note that NO REFILLS for any discharge medications will be authorized once you are discharged, as it is imperative that you return to your primary care physician (or establish a relationship with a primary care physician if you do not have one) for your aftercare needs so that they can reassess your need for medications and monitor your lab values.  Discharge Instructions    Diet general    Complete by:  As directed    Face-to-face encounter (required for Medicare/Medicaid patients)    Complete by:  As directed    I Rasheen Schewe certify that this patient is under my care and that I, or a nurse practitioner or physician's assistant working with me, had a face-to-face encounter that meets the physician face-to-face encounter requirements with this patient on 04/17/2017. The encounter with the patient was in whole, or in part for the following medical condition(s) which is the primary  reason for home health care (List medical condition): FTT   The encounter with the patient was in whole, or in part, for the following medical condition, which is the primary reason for home health care:  FTT   I certify that, based on my findings, the following services are medically necessary home health services:   Nursing Physical therapy     Reason for Medically Necessary Home Health Services:  Skilled Nursing- Change/Decline in Patient Status   My clinical findings support the need for the above services:  Shortness of breath with activity   Further, I certify that my clinical findings support that this patient is homebound due to:  Shortness of Breath with activity   Home Health    Complete by:  As directed    To provide the following care/treatments:   PT RN     Increase activity slowly    Complete by:  As directed      Allergies as of 04/22/2017   No Known Allergies     Medication List    STOP taking these medications    cyclobenzaprine 10 MG tablet Commonly known as:  FLEXERIL     TAKE these medications   albuterol 108 (90 Base) MCG/ACT inhaler Commonly known as:  PROVENTIL HFA;VENTOLIN HFA Inhale 2 puffs into the lungs every 6 (six) hours as needed for wheezing or shortness of breath.   ALPRAZolam 0.5 MG tablet Commonly known as:  XANAX Take 0.5 mg by mouth 2 (two) times daily as needed for anxiety.   atorvastatin 40 MG tablet Commonly known as:  LIPITOR Take 40 mg by mouth at bedtime.   benztropine 2 MG tablet Commonly known as:  COGENTIN Take 2 mg by mouth 2 (two) times daily.   eszopiclone 3 MG Tabs Generic drug:  Eszopiclone Take 3 mg by mouth at bedtime. Take immediately before bedtime   gabapentin 300 MG capsule Commonly known as:  NEURONTIN Take 300 mg by mouth at bedtime.   guaiFENesin 600 MG 12 hr tablet Commonly known as:  MUCINEX Take 1 tablet (600 mg total) by mouth 2 (two) times daily.   ibuprofen 200 MG tablet Commonly known as:  ADVIL,MOTRIN Take 800 mg by mouth every 6 (six) hours as needed for mild pain or moderate pain.   ipratropium-albuterol 0.5-2.5 (3) MG/3ML Soln Commonly known as:  DUONEB Take 3 mLs by nebulization every 6 (six) hours as needed.   lurasidone 80 MG Tabs tablet Commonly known as:  LATUDA Take 80 mg by mouth at bedtime.   montelukast 10 MG tablet Commonly known as:  SINGULAIR Take 1 tablet (10 mg total) by mouth at bedtime.   polyethylene glycol packet Commonly known as:  MIRALAX / GLYCOLAX Take 17 g by mouth daily.   predniSONE 10 MG tablet Commonly known as:  DELTASONE Label  & dispense according to the schedule below. 5Pills PO on day one then, 4 Pills PO on day two, 3 Pills PO on day three, 2 Pills PO on day four,  1 Pills PO on day five,  then STOP. What changed:  medication strength  how much to take  how to take this  when to take this  additional instructions   senna-docusate 8.6-50 MG tablet Commonly known as:   Senokot-S Take 1 tablet by mouth at bedtime.   VIIBRYD 40 MG Tabs Generic drug:  Vilazodone HCl Take 40 mg by mouth daily.      No Known Allergies Follow-up Information    [facility],  Garner Gavel, PA Follow up in 1 week(s).   Why:  hospital discharge follow up Contact information: [address] [place] [address] [phone]        establish care with pulmonology for copd management Follow up in 1 month(s).        [facility] Follow up in 1 month(s).   Specialty:  Pulmonology Why:  for copd management Contact information: [address] [place] [address] [phone]       [facility] Follow up.   Specialty:  Home Health Services Why:  For home health physical therapy and nurse. Contact information: [address] [place] [address] [phone]            The results of significant diagnostics from this hospitalization (including imaging, microbiology, ancillary and laboratory) are listed below for reference.    Significant Diagnostic Studies: Dg Chest 2 View  Result Date: 04/13/2017 CLINICAL DATA:  Shortness of breath and cough EXAM: CHEST  2 VIEW COMPARISON:  None. FINDINGS: There is no edema or consolidation. The heart size and pulmonary vascularity are normal. No adenopathy. There is degenerative change in the thoracic spine. IMPRESSION: No edema or consolidation. Electronically Signed   By: Bretta Bang III M.D.   On: 04/13/2017 17:48   Ct Angio Chest Pe W Or Wo Contrast  Result Date: 04/16/2017 CLINICAL DATA:  O cough, respiratory distress, hypoxia EXAM: CT ANGIOGRAPHY CHEST WITH CONTRAST TECHNIQUE: Multidetector CT imaging of the chest was performed using the standard protocol during bolus administration of intravenous contrast. Multiplanar CT image reconstructions and MIPs were obtained to evaluate the vascular anatomy. CONTRAST:  100 cc Isovue 370 IV COMPARISON:  Chest x-ray  04/13/2017 FINDINGS: Cardiovascular: No filling defects in the pulmonary arteries to suggest pulmonary emboli. Heart is normal size. Aorta is normal caliber. Mediastinum/Nodes: No mediastinal, hilar, or axillary adenopathy. Trachea and esophagus are unremarkable. Lungs/Pleura: Ground-glass opacities are noted in both upper lobes. 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 No effusions. Upper Abdomen: Imaging into the upper abdomen shows no acute findings. Musculoskeletal: Chest wall soft tissues are unremarkable. No acute bony abnormality. Review of the MIP images confirms the above findings. IMPRESSION: No evidence of pulmonary embolus. 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 For Electronically Signed   By: Charlett Nose M.D.   On: 04/16/2017 17:42    Microbiology: Recent Results (from the past 240 hour(s))  MRSA PCR Screening     Status: None   Collection Time: 04/13/17 10:54 PM  Result Value Ref Range Status   MRSA by PCR NEGATIVE NEGATIVE Final    Comment:        The GeneXpert MRSA Assay (FDA approved for NASAL specimens only), is one component of a comprehensive MRSA colonization surveillance program. It is not intended to diagnose MRSA infection nor to guide or monitor treatment for MRSA infections.   Respiratory Panel by PCR     Status: Abnormal   Collection Time: 04/16/17  1:45 PM  Result Value Ref Range Status   Adenovirus DETECTED (A) NOT DETECTED Final   Coronavirus 229E NOT DETECTED NOT DETECTED Final   Coronavirus HKU1 NOT DETECTED NOT DETECTED Final   Coronavirus NL63 NOT DETECTED NOT DETECTED Final   Coronavirus OC43 NOT DETECTED NOT DETECTED Final   Metapneumovirus NOT DETECTED NOT DETECTED Final   Rhinovirus / Enterovirus NOT DETECTED NOT DETECTED Final   Influenza A NOT DETECTED NOT  DETECTED Final   Influenza B NOT DETECTED NOT DETECTED Final   Parainfluenza  Virus 1 NOT DETECTED NOT DETECTED Final   Parainfluenza Virus 2 NOT DETECTED NOT DETECTED Final   Parainfluenza Virus 3 NOT DETECTED NOT DETECTED Final   Parainfluenza Virus 4 NOT DETECTED NOT DETECTED Final   Respiratory Syncytial Virus NOT DETECTED NOT DETECTED Final   Bordetella pertussis NOT DETECTED NOT DETECTED Final   Chlamydophila pneumoniae NOT DETECTED NOT DETECTED Final   Mycoplasma pneumoniae NOT DETECTED NOT DETECTED Final    Comment: Performed at [facility], [address] [place], [place] [id number]     Labs: Basic Metabolic Panel:  Recent Labs Lab 04/17/17 0302 04/18/17 0320 04/20/17 0404 04/21/17 0354 04/22/17 0412  NA 142 140 143 141 140  K 4.1 3.9 4.3 4.2 4.3  CL 105 103 104 106 104  CO2 31 30 31 26 27   GLUCOSE 156* 160* 179* 175* 160*  BUN 12 14 18 19 15   CREATININE 0.63 0.69 0.82 0.76 0.70  CALCIUM 9.2 9.0 8.5* 8.2* 8.4*  MG 2.1  --  2.5*  --  2.3   Liver Function Tests: No results for input(s): AST, ALT, ALKPHOS, BILITOT, PROT, ALBUMIN in the last 168 hours. No results for input(s): LIPASE, AMYLASE in the last 168 hours. No results for input(s): AMMONIA in the last 168 hours. CBC:  Recent Labs Lab 04/17/17 0302 04/22/17 0412  WBC 13.7* 17.3*  NEUTROABS 12.2* 15.8*  HGB 12.9 12.9  HCT 39.1 38.7  MCV 89.9 89.4  PLT 210 207   Cardiac Enzymes: No results for input(s): CKTOTAL, CKMB, CKMBINDEX, TROPONINI in the last 168 hours. BNP: BNP (last 3 results) No results for input(s): BNP in the last 8760 hours.  ProBNP (last 3 results) No results for input(s): PROBNP in the last 8760 hours.  CBG:  Recent Labs Lab 04/20/17 2017 04/21/17 0711 04/21/17 1207 04/21/17 1721 04/22/17 0803  GLUCAP 202* 142* 199* 153* 126*       Signed:  Annalina Needles MD, PhD  [facility] 04/22/2017, 8:06 PM

## 2017-04-23 ENCOUNTER — Emergency Department (HOSPITAL_COMMUNITY)
Admission: EM | Admit: 2017-04-23 | Discharge: 2017-04-23 | Disposition: A | Discharge status: Home / Self Care | Payer: Medicare Other | Attending: Emergency Medicine | Admitting: Emergency Medicine

## 2017-04-23 ENCOUNTER — Emergency Department (HOSPITAL_COMMUNITY): Payer: Medicare Other

## 2017-04-23 ENCOUNTER — Encounter (HOSPITAL_COMMUNITY): Payer: Self-pay | Admitting: Emergency Medicine

## 2017-04-23 DIAGNOSIS — J449 Chronic obstructive pulmonary disease, unspecified: Secondary | ICD-10-CM | POA: Diagnosis not present

## 2017-04-23 DIAGNOSIS — F17203 Nicotine dependence unspecified, with withdrawal: Secondary | ICD-10-CM

## 2017-04-23 DIAGNOSIS — R0602 Shortness of breath: Secondary | ICD-10-CM | POA: Diagnosis present

## 2017-04-23 DIAGNOSIS — Z79899 Other long term (current) drug therapy: Secondary | ICD-10-CM | POA: Insufficient documentation

## 2017-04-23 DIAGNOSIS — F17210 Nicotine dependence, cigarettes, uncomplicated: Secondary | ICD-10-CM | POA: Diagnosis not present

## 2017-04-23 DIAGNOSIS — F419 Anxiety disorder, unspecified: Secondary | ICD-10-CM

## 2017-04-23 LAB — T3, FREE: T3, Free: 1.1 pg/mL — ABNORMAL LOW (ref 2.0–4.4)

## 2017-04-23 IMAGING — CR DG CHEST 2V
2 series · 2 of 2 positions shown · non-contrast
Comparison: [DATE] chest radiograph.  [DATE] chest CT.

CLINICAL DATA: 66 y/o  F; shortness of breath.

EXAM:
CHEST  2 VIEW

[w chest pa]
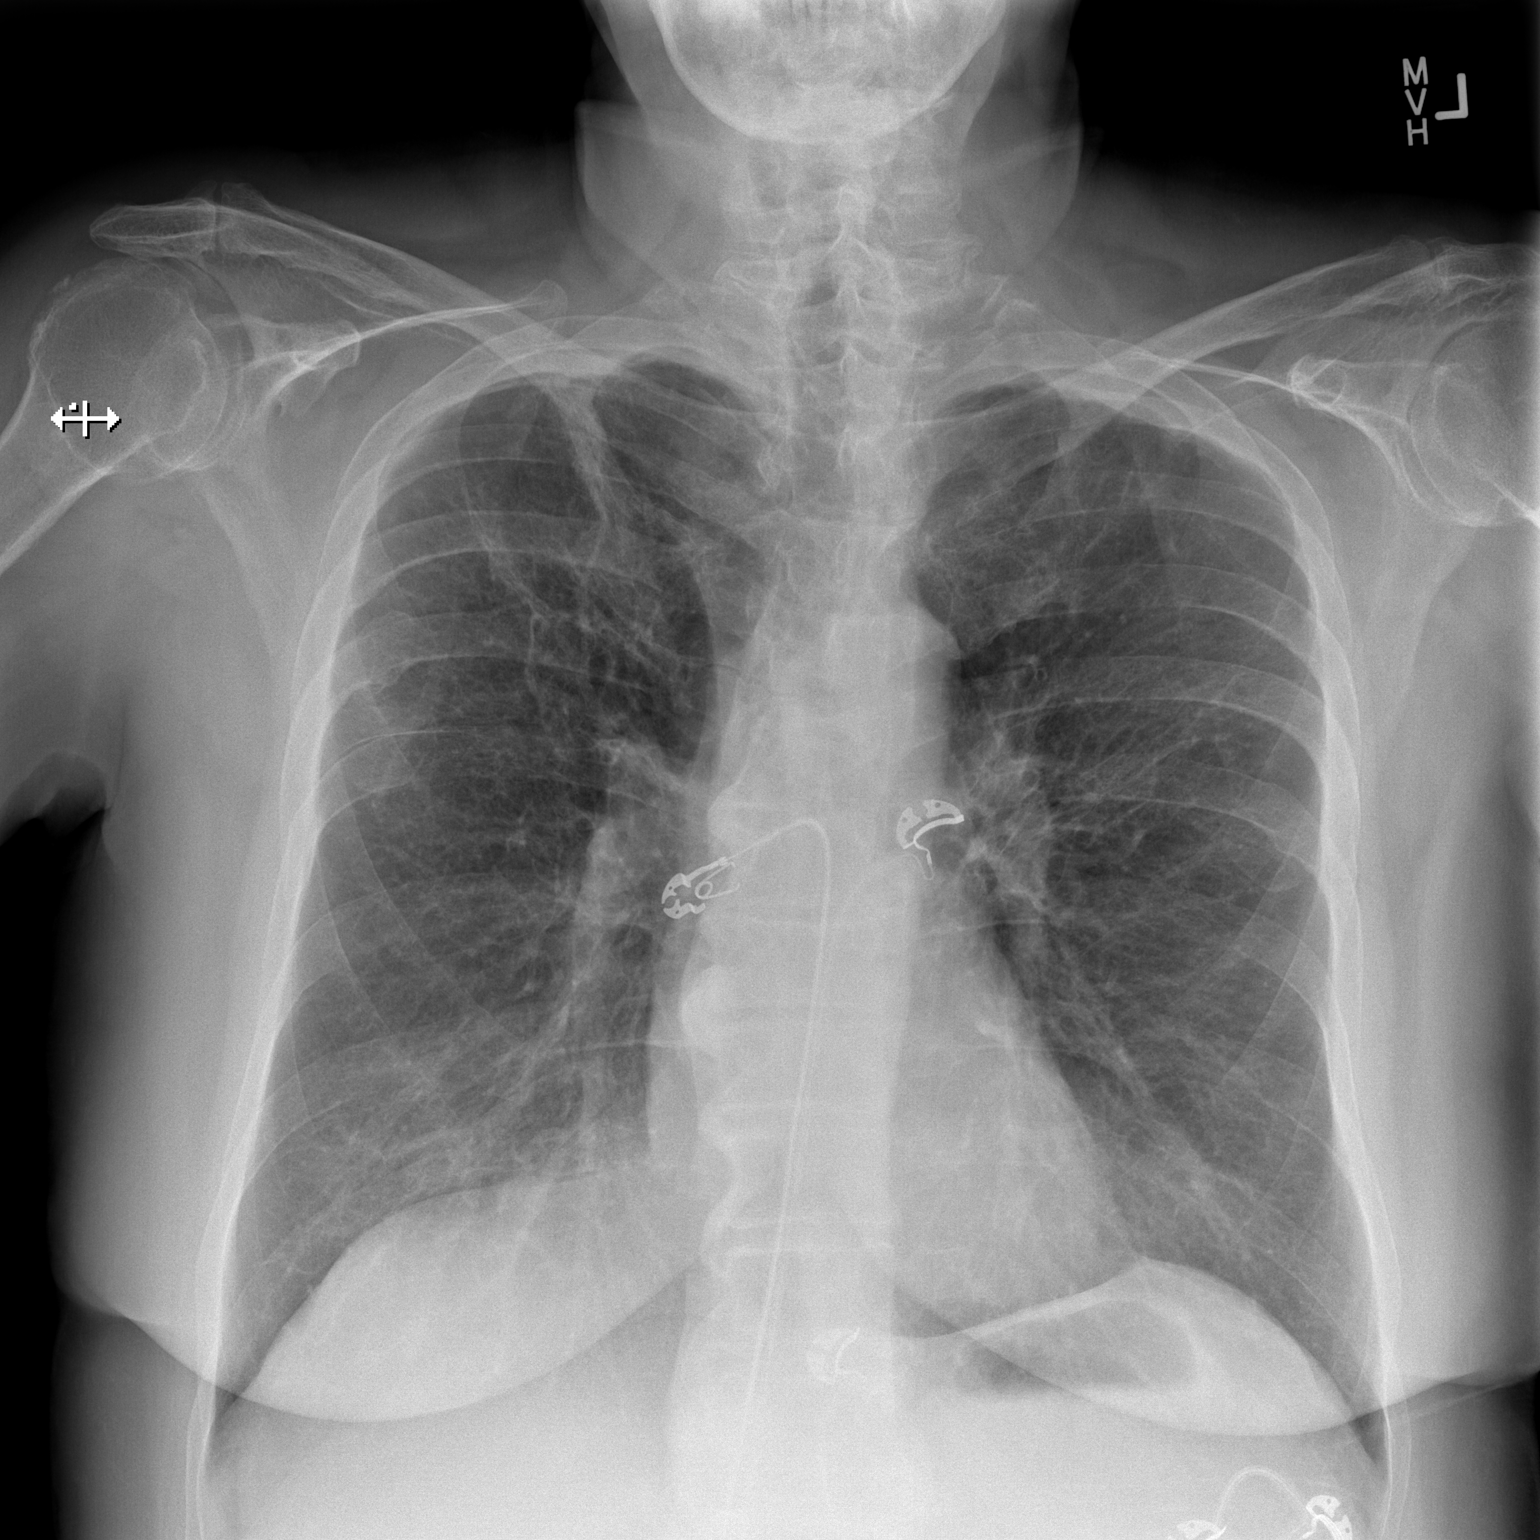

[w chest lat]
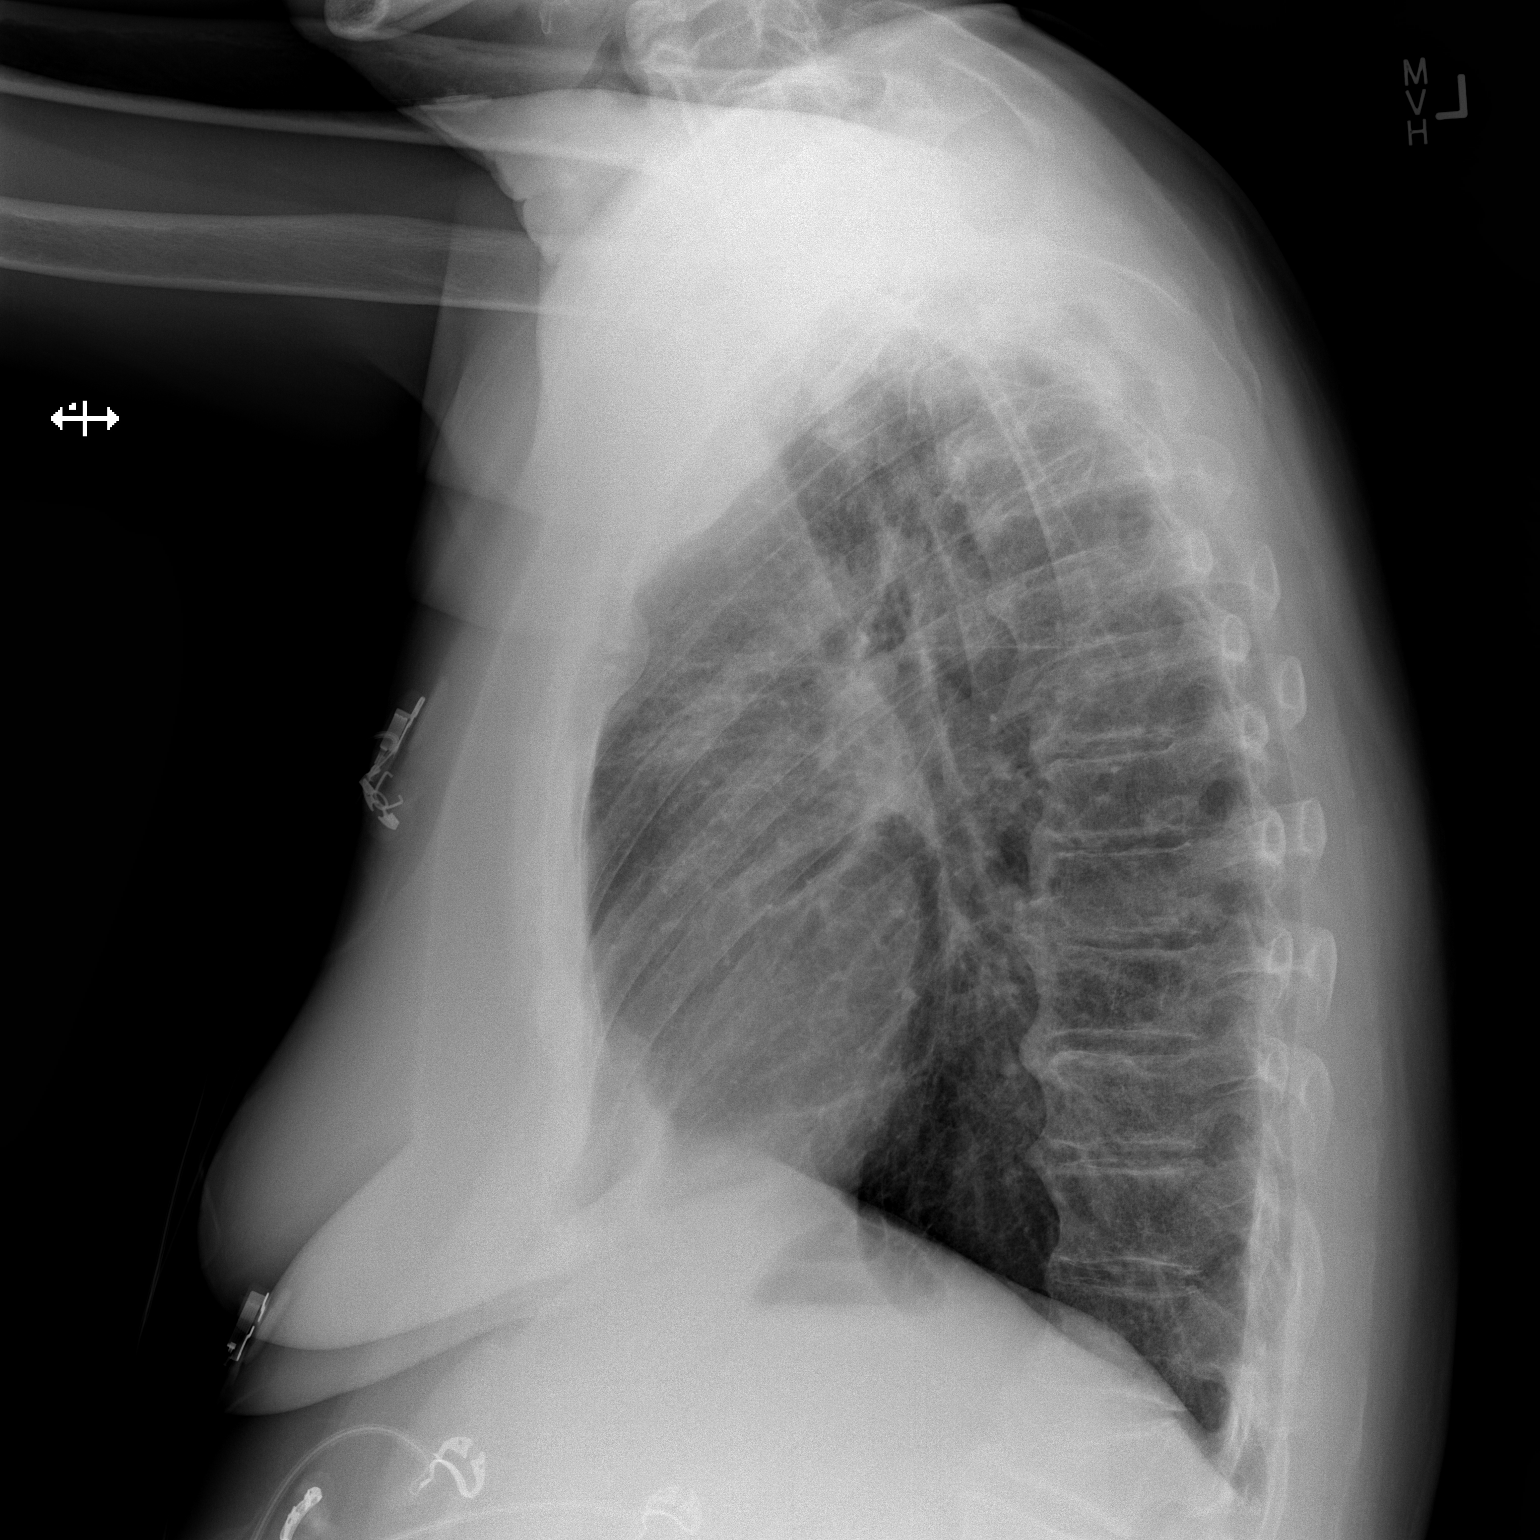

[2 of 2 positions shown; findings below may reference images not displayed]

FINDINGS: Normal cardiac silhouette. Stable streaky linear opacities in the
upper lung zones when compared with prior CT given differences in
technique. No new focal consolidation, edema, or effusion. Moderate
degenerative changes of the thoracic spine.
IMPRESSION: Stable streaky opacities in the upper lung zones.No new focal
consolidation, edema, or effusion.

By: TIGER M.D.

## 2017-04-23 MED ORDER — ALBUTEROL SULFATE (2.5 MG/3ML) 0.083% IN NEBU
5.0000 mg | INHALATION_SOLUTION | Freq: Once | RESPIRATORY_TRACT | Status: AC
  Administered 2017-04-23: 5 mg via RESPIRATORY_TRACT
  Filled 2017-04-23: qty 6

## 2017-04-23 MED ORDER — IPRATROPIUM BROMIDE 0.02 % IN SOLN
0.5000 mg | Freq: Once | RESPIRATORY_TRACT | Status: AC
  Administered 2017-04-23: 0.5 mg via RESPIRATORY_TRACT
  Filled 2017-04-23: qty 2.5

## 2017-04-23 MED ORDER — ALPRAZOLAM 0.5 MG PO TABS
1.0000 mg | ORAL_TABLET | Freq: Once | ORAL | Status: AC
  Administered 2017-04-23: 1 mg via ORAL
  Filled 2017-04-23: qty 2

## 2017-04-23 NOTE — ED Notes (Signed)
Patient refuses to have VS rechecked before dc. States, "they're fine"

## 2017-04-23 NOTE — Discharge Instructions (Signed)
Try using nicotine gum to help with nicotine withdrawal. Continue to stay well-hydrated. Use Mucinex for cough suppression/expectoration of mucus. Continue to use your home inhaler and nebulizers as directed, as needed for cough/chest congestion/wheezing/shortness of breath/etc. Take all of your home medications as directed, including the prednisone they gave you yesterday (take in the morning with breakfast). Take xanax 1mg  every 8 hours as needed for anxiety, and consider using a dose in the morning with breakfast BEFORE you take your inhalers/nebulizers. Followup with your primary care doctor in 3-5 days for recheck of ongoing symptoms. Return to emergency department for emergent changing or worsening of symptoms.

## 2017-04-23 NOTE — ED Notes (Signed)
Respiratory at bedside.

## 2017-04-23 NOTE — ED Provider Notes (Signed)
[facility]-EMERGENCY DEPT Provider Note   CSN: [id number] Arrival date & time: 04/23/17  1249     History   Chief Complaint Chief Complaint  Patient presents with  . Shortness of Breath    HPI Judy [name] is a 66 y.o. female with a PMHx of COPD, Bipolar 1 disorder, and PTSD, who presents to the ED with complaints of "having an anxiety attack". Patient states that this morning around 10:45 AM she was feeling short of breath and had wheezing, so she tried to take her albuterol inhaler but it didn't help, and then she started feeling anxious and started having an anxiety attack. She states that she tried taking Xanax 0.5 mg but this did not help, so she came to the emergency room. She was given one albuterol nebulizer treatment in the triage area which she reports didn't help either, but around 4 PM her symptoms started improving on their own, and now they're better than when she came in, however are starting to return slightly since it's been several hours since her meds were last given. She states that all of her symptoms feel like an anxiety attack. She mentions that her shortness breath is actually better than when she was discharged yesterday from the hospital. Chart review reveals she was admitted on 04/13/17 for COPD exacerbation, she had been having cough/fevers/SOB/wheezing and was seen by her PCP prior to admission, given augmentin and prednisone but the day of admission she was SOB and hypoxic on RA, requiring Bipap which improved her hypoxia; she was given nebs and solumedrol and admitted; her CTA Chest that day was negative for PE but revealed b/l upper lobe opacities. During her admission, initially abx were continued, first doxy then switched to azithromycin, she was given aggressive neb treatments and steroids, eventually weaned from bipap and transitioned to room air, viral respiratory panel returned with +adenovirus; she improved by yesterday and was discharged home with short course of  steroids, singulair, and duoneb rx but no abx were given at discharge. She states that she has been compliant with all of her medications, including taking the prednisone and albuterol this morning. She has not smoked any cigarettes in 13 days and feels this might be contributing to her anxiety. She states that her anxiety worsens as she feels short of breath, and improves when she lays on her stomach. She was given Ativan in the hospital once and that helped, but she has never been given anything other than Xanax at home. She can't recall whether she's ever been trialed on a higher dose of Xanax.   She denies any fevers, chills, CP, hemoptysis, LE swelling, recent travel/surgery, estrogen use, personal or family history of DVT/PE, abdominal pain, N/V/D/C, dysuria, hematuria, myalgias, arthralgias, numbness, tingling, focal weakness, or any other complaints at this time.   The history is provided by the patient and medical records. No language interpreter was used.  Shortness of Breath  This is a recurrent problem. The average episode lasts 6 hours. The problem occurs intermittently.The current episode started 6 to 12 hours ago. The problem has been gradually improving. Associated symptoms include wheezing. Pertinent negatives include no fever, no hemoptysis, no chest pain, no vomiting, no abdominal pain and no leg swelling. Precipitated by: COPD exacerbation. Risk factors include smoking. She has tried oral steroids, inhaled steroids and beta-agonist inhalers for the symptoms. The treatment provided no relief. She has had prior hospitalizations. She has had prior ED visits. She has had prior ICU admissions. Associated medical issues include  COPD.    Past Medical History:  Diagnosis Date  . Bipolar 1 disorder (HCC)   . COPD (chronic obstructive pulmonary disease) (HCC)   . PTSD (post-traumatic stress disorder)     Patient Active Problem List   Diagnosis Date Noted  . COPD with acute exacerbation  (HCC) 04/14/2017  . Hyperglycemia 04/14/2017  . Acute on chronic respiratory failure with hypoxia (HCC) 04/13/2017    Past Surgical History:  Procedure Laterality Date  . APPENDECTOMY    . bone spurs foot    . HAND SURGERY Left     OB History    No data available       Home Medications    Prior to Admission medications   Medication Sig Start Date End Date Taking? Authorizing Provider  albuterol (PROVENTIL HFA;VENTOLIN HFA) 108 (90 Base) MCG/ACT inhaler Inhale 2 puffs into the lungs every 6 (six) hours as needed for wheezing or shortness of breath.    [provider]  ALPRAZolam Prudy Feeler) 0.5 MG tablet Take 0.5 mg by mouth 2 (two) times daily as needed for anxiety.     [provider]  atorvastatin (LIPITOR) 40 MG tablet Take 40 mg by mouth at bedtime.     [provider]  benztropine (COGENTIN) 2 MG tablet Take 2 mg by mouth 2 (two) times daily.    [provider]  Eszopiclone (ESZOPICLONE) 3 MG TABS Take 3 mg by mouth at bedtime. Take immediately before bedtime    [provider]  gabapentin (NEURONTIN) 300 MG capsule Take 300 mg by mouth at bedtime.    [provider]  guaiFENesin (MUCINEX) 600 MG 12 hr tablet Take 1 tablet (600 mg total) by mouth 2 (two) times daily. 04/22/17   Albertine Grates, MD  ibuprofen (ADVIL,MOTRIN) 200 MG tablet Take 800 mg by mouth every 6 (six) hours as needed for mild pain or moderate pain.    [provider]  ipratropium-albuterol (DUONEB) 0.5-2.5 (3) MG/3ML SOLN Take 3 mLs by nebulization every 6 (six) hours as needed. 04/22/17   Albertine Grates, MD  lurasidone (LATUDA) 80 MG TABS tablet Take 80 mg by mouth at bedtime.     [provider]  montelukast (SINGULAIR) 10 MG tablet Take 1 tablet (10 mg total) by mouth at bedtime. 04/22/17   Albertine Grates, MD  polyethylene glycol [facility] / Ethelene Hal) packet Take 17 g by mouth daily. 04/22/17   Albertine Grates, MD  predniSONE (DELTASONE) 10 MG tablet Label  & dispense  according to the schedule below. 5Pills PO on day one then, 4 Pills PO on day two, 3 Pills PO on day three, 2 Pills PO on day four,  1 Pills PO on day five,  then STOP. 04/22/17   Albertine Grates, MD  senna-docusate (SENOKOT-S) 8.6-50 MG tablet Take 1 tablet by mouth at bedtime. 04/22/17   Albertine Grates, MD  Vilazodone HCl (VIIBRYD) 40 MG TABS Take 40 mg by mouth daily.     [provider]    Family History Family History  Problem Relation Age of Onset  . CAD Mother 61  . CAD Father     Social History Social History  Substance Use Topics  . Smoking status: Current Every Day Smoker    Packs/day: 0.50    Years: 43.00    Types: Cigarettes  . Smokeless tobacco: Never Used  . Alcohol use Yes     Comment: occassionaly     Allergies   Patient has no known allergies.  Review of Systems Review of Systems  Constitutional: Negative for chills and fever.  Respiratory: Positive for shortness of breath and wheezing. Negative for hemoptysis.   Cardiovascular: Negative for chest pain and leg swelling.  Gastrointestinal: Negative for abdominal pain, constipation, diarrhea, nausea and vomiting.  Genitourinary: Negative for dysuria and hematuria.  Musculoskeletal: Negative for arthralgias and myalgias.  Skin: Negative for color change.  Allergic/Immunologic: Negative for immunocompromised state.  Neurological: Negative for weakness and numbness.  Psychiatric/Behavioral: The patient is nervous/anxious.    All other systems reviewed and are negative for acute change except as noted in the HPI.    Physical Exam Updated Vital Signs BP 128/61 (BP Location: Left Arm)   Pulse 62   Temp (!) 97.4 F (36.3 C) (Oral)   Resp (!) 22   SpO2 98%   Physical Exam  Constitutional: She is oriented to person, place, and time. Vital signs are normal. She appears well-developed and well-nourished.  Non-toxic appearance. She appears distressed (anxious).  Afebrile, nontoxic, anxious appearing  HENT:    Head: Normocephalic and atraumatic.  Mouth/Throat: Oropharynx is clear and moist and mucous membranes are normal.  Eyes: Conjunctivae and EOM are normal. Right eye exhibits no discharge. Left eye exhibits no discharge.  Neck: Normal range of motion. Neck supple.  Cardiovascular: Normal rate, regular rhythm, normal heart sounds and intact distal pulses.  Exam reveals no gallop and no friction rub.   No murmur heard. RRR, nl s1/s2, no m/r/g, distal pulses intact, no pedal edema   Pulmonary/Chest: Effort normal. No respiratory distress. She has no decreased breath sounds. She has wheezes. She has rhonchi. She has no rales.  Faint expiratory wheezing throughout and with scattered rhonchi throughout, no rales, no hypoxia or increased WOB, speaking in full sentences, at times seems to hyperventilate but able to be calmed easily and no further tachypnea noted; SpO2 98% on RA   Abdominal: Soft. Normal appearance and bowel sounds are normal. She exhibits no distension. There is no tenderness. There is no rigidity, no rebound, no guarding, no CVA tenderness, no tenderness at McBurney's point and negative Murphy's sign.  Musculoskeletal: Normal range of motion.  Neurological: She is alert and oriented to person, place, and time. She has normal strength. No sensory deficit.  Skin: Skin is warm, dry and intact. No rash noted.  Psychiatric: Her mood appears anxious.  Anxious appearing, shaking her legs back and forth during evaluation  Nursing note and vitals reviewed.    ED Treatments / Results  Labs (all labs ordered are listed, but only abnormal results are displayed) Labs Reviewed - No data to display  EKG  EKG Interpretation  Date/Time:  Wednesday April 23 2017 13:01:52 EDT Ventricular Rate:  90 PR Interval:    QRS Duration: 92 QT Interval:  377 QTC Calculation: 462 R Axis:     Text Interpretation:  Sinus rhythm Baseline wander in lead(s) V6 No significant change since last tracing  Confirmed by Alvira Monday ([id number]) on 04/23/2017 7:26:53 PM       Radiology Dg Chest 2 View  Result Date: 04/23/2017 CLINICAL DATA:  66 y/o  F; shortness of breath. EXAM: CHEST  2 VIEW COMPARISON:  04/13/2017 chest radiograph.  04/16/2017 chest CT. FINDINGS: Normal cardiac silhouette. Stable streaky linear opacities in the upper lung zones when compared with prior CT given differences in technique. No new focal consolidation, edema, or effusion. Moderate degenerative changes of the thoracic spine. IMPRESSION: Stable streaky opacities in the upper lung zones.No new focal  consolidation, edema, or effusion. Electronically Signed   By: Mitzi Hansen M.D.   On: 04/23/2017 14:15   Ct Angio Chest Pe W Or Wo Contrast Result Date: 04/16/2017 CLINICAL DATA:  O cough, respiratory distress, hypoxia EXAM: CT ANGIOGRAPHY CHEST WITH CONTRAST TECHNIQUE: Multidetector CT imaging of the chest was performed using the standard protocol during bolus administration of intravenous contrast. Multiplanar CT image reconstructions and MIPs were obtained to evaluate the vascular anatomy. CONTRAST:  100 cc Isovue 370 IV COMPARISON:  Chest x-ray 04/13/2017 FINDINGS: Cardiovascular: No filling defects in the pulmonary arteries to suggest pulmonary emboli. Heart is normal size. Aorta is normal caliber. Mediastinum/Nodes: No mediastinal, hilar, or axillary adenopathy. Trachea and esophagus are unremarkable. Lungs/Pleura: Ground-glass opacities are noted in both upper lobes. 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 No effusions. Upper Abdomen: Imaging into the upper abdomen shows no acute findings. Musculoskeletal: Chest wall soft tissues are unremarkable. No acute bony abnormality. Review of the MIP images confirms the above findings. IMPRESSION: No evidence of pulmonary embolus. 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 For Electronically Signed   By: Charlett Nose M.D.   On: 04/16/2017 17:42     Procedures Procedures (including critical care time)  Medications Ordered in ED Medications  albuterol (PROVENTIL) (2.5 MG/3ML) 0.083% nebulizer solution 5 mg (5 mg Nebulization Given 04/23/17 1352)  ALPRAZolam (XANAX) tablet 1 mg (1 mg Oral Given 04/23/17 2029)  albuterol (PROVENTIL) (2.5 MG/3ML) 0.083% nebulizer solution 5 mg (5 mg Nebulization Given 04/23/17 2033)  ipratropium (ATROVENT) nebulizer solution 0.5 mg (0.5 mg Nebulization Given 04/23/17 2033)     Initial Impression / Assessment and Plan / ED Course  I have reviewed the triage vital signs and the nursing notes.  Pertinent labs & imaging results that were available during my care of the patient were reviewed by me and considered in my medical decision making (see chart for details).     66 y.o. female here with SOB and anxiety attack. States this morning she felt SOB, that made her more anxious; reports after a few hrs it improved on its own however now she's having some SOB again and that makes her more anxious feeling. On exam, appears very anxious, shaking legs repeatedly, no hypoxia or increased WOB, slight expiratory wheezing throughout, slight scattered rhonchi, no rales. No LE swelling, no tachycardia or hypoxia. Was given albuterol several hours ago but she states that didn't help, however given that she's wheezing now and reportedly wasn't earlier, it likely did help more than she thinks. CXR shows stable streaky opacities in upper lung fields (just discharged yesterday for COPD exacerbation, on steroids, +adenovirus on panel while hospitalized, CTA of chest neg for PE while there). It's likely that her SOB from the COPD exacerbation (which she states is improved from discharge) is causing her to feel anxious, compounded by the fact that she's had a lot of steroids lately, and quit smoking, all of which will make her anxious. Will try  increased dose of xanax from her home dose, and duoneb now; will reassess shortly, however doubt need for further labs/imaging at this time.   10:23 PM Pt feeling better and lung sounds tremendously after duoneb. Stating anxiety improved after xanax 1mg , reports better effectiveness at this dosage. Advised increasing to 1mg  TID PRN anxiety, and perhaps trying to take a morning dose BEFORE her nebs since her nebs make her jittery and worsen the anxiety. Advised taking all home medications  as directed, and prednisone first thing in the AM with breakfast to try to help lessen intensity of side effects. I encouraged her to continue smoking cessation efforts, however since nicotine withdrawal is likely contributing some to her anxiety, it's reasonable to try OTC nicorette gum/patches to assist with the withdrawal of it. Advised staying hydrated, getting plenty of rest, using OTC remedies to help with her bronchitis/cough, and all the home meds she was prescribed, and then f/up with PCP in 3-5 days for recheck. I explained the diagnosis and have given explicit precautions to return to the ER including for any other new or worsening symptoms. The patient understands and accepts the medical plan as it's been dictated and I have answered their questions. Discharge instructions concerning home care and prescriptions have been given. The patient is STABLE and is discharged to home in good condition.    Final Clinical Impressions(s) / ED Diagnoses   Final diagnoses:  Anxiety  SOB (shortness of breath)  Nicotine withdrawal    New Prescriptions New Prescriptions   No medications on [address] [place], [place] 04/23/17 2227    Alvira Monday, MD 04/24/17 2241

## 2017-04-23 NOTE — ED Notes (Signed)
Pt c/o feeling glittery after breathing treatment and feeling like she is unable to catch her breath. o2 sat 96-95% on RA. Pt states "I hope the Xanax kicks in quick."

## 2017-04-23 NOTE — ED Triage Notes (Signed)
Pt verbalizes was discharged yesterday from hospital for SOB; worsening SOB since home even at rest unrelieved by treatment regimen.

## 2017-08-17 ENCOUNTER — Emergency Department (HOSPITAL_COMMUNITY): Payer: Medicare Other

## 2017-08-17 ENCOUNTER — Encounter (HOSPITAL_COMMUNITY): Payer: Self-pay | Admitting: Emergency Medicine

## 2017-08-17 ENCOUNTER — Emergency Department (HOSPITAL_COMMUNITY)
Admission: EM | Admit: 2017-08-17 | Discharge: 2017-08-17 | Disposition: A | Discharge status: Home / Self Care | Payer: Medicare Other | Attending: Emergency Medicine | Admitting: Emergency Medicine

## 2017-08-17 DIAGNOSIS — F410 Panic disorder [episodic paroxysmal anxiety] without agoraphobia: Secondary | ICD-10-CM

## 2017-08-17 DIAGNOSIS — F17210 Nicotine dependence, cigarettes, uncomplicated: Secondary | ICD-10-CM | POA: Insufficient documentation

## 2017-08-17 DIAGNOSIS — J441 Chronic obstructive pulmonary disease with (acute) exacerbation: Secondary | ICD-10-CM | POA: Insufficient documentation

## 2017-08-17 DIAGNOSIS — Z79899 Other long term (current) drug therapy: Secondary | ICD-10-CM | POA: Insufficient documentation

## 2017-08-17 DIAGNOSIS — R0602 Shortness of breath: Secondary | ICD-10-CM | POA: Diagnosis present

## 2017-08-17 LAB — CBC WITH DIFFERENTIAL/PLATELET
Basophils Absolute: 0 10*3/uL (ref 0.0–0.1)
Basophils Relative: 0 %
Eosinophils Absolute: 0.1 10*3/uL (ref 0.0–0.7)
Eosinophils Relative: 0 %
HCT: 42.7 % (ref 36.0–46.0)
Hemoglobin: 14.5 g/dL (ref 12.0–15.0)
Lymphocytes Relative: 7 %
Lymphs Abs: 1.5 10*3/uL (ref 0.7–4.0)
MCH: 30.3 pg (ref 26.0–34.0)
MCHC: 34 g/dL (ref 30.0–36.0)
MCV: 89.3 fL (ref 78.0–100.0)
Monocytes Absolute: 1.2 10*3/uL — ABNORMAL HIGH (ref 0.1–1.0)
Monocytes Relative: 6 %
Neutro Abs: 18.2 10*3/uL — ABNORMAL HIGH (ref 1.7–7.7)
Neutrophils Relative %: 87 %
Platelets: 345 10*3/uL (ref 150–400)
RBC: 4.78 MIL/uL (ref 3.87–5.11)
RDW: 13 % (ref 11.5–15.5)
WBC: 21.1 10*3/uL — ABNORMAL HIGH (ref 4.0–10.5)

## 2017-08-17 LAB — COMPREHENSIVE METABOLIC PANEL
ALT: 20 U/L (ref 14–54)
AST: 25 U/L (ref 15–41)
Albumin: 3.7 g/dL (ref 3.5–5.0)
Alkaline Phosphatase: 141 U/L — ABNORMAL HIGH (ref 38–126)
Anion gap: 18 — ABNORMAL HIGH (ref 5–15)
BUN: 13 mg/dL (ref 6–20)
CO2: 17 mmol/L — ABNORMAL LOW (ref 22–32)
Calcium: 10 mg/dL (ref 8.9–10.3)
Chloride: 105 mmol/L (ref 101–111)
Creatinine, Ser: 1.03 mg/dL — ABNORMAL HIGH (ref 0.44–1.00)
GFR calc Af Amer: >60 mL/min (ref >60)
GFR calc non Af Amer: 56 mL/min — ABNORMAL LOW (ref >60)
Glucose, Bld: 113 mg/dL — ABNORMAL HIGH (ref 65–99)
Potassium: 3.6 mmol/L (ref 3.5–5.1)
Sodium: 140 mmol/L (ref 135–145)
Total Bilirubin: 0.9 mg/dL (ref 0.3–1.2)
Total Protein: 7.7 g/dL (ref 6.5–8.1)

## 2017-08-17 LAB — BLOOD GAS, VENOUS
Acid-base deficit: 5.4 mmol/L — ABNORMAL HIGH (ref 0.0–2.0)
Bicarbonate: 20.6 mmol/L (ref 20.0–28.0)
O2 Saturation: 23.2 %
Patient temperature: 98.6
pCO2, Ven: 44.2 mmHg (ref 44.0–60.0)
pH, Ven: 7.29 (ref 7.250–7.430)

## 2017-08-17 LAB — I-STAT TROPONIN, ED: Troponin i, poc: 0.02 ng/mL (ref 0.00–0.08)

## 2017-08-17 IMAGING — CR DG CHEST 2V
2 series · 2 of 2 positions shown · non-contrast
Comparison: Chest radiograph dated [DATE]

CLINICAL DATA: 66-year-old female with wheezing.

EXAM:
CHEST  2 VIEW

[w chest pa]
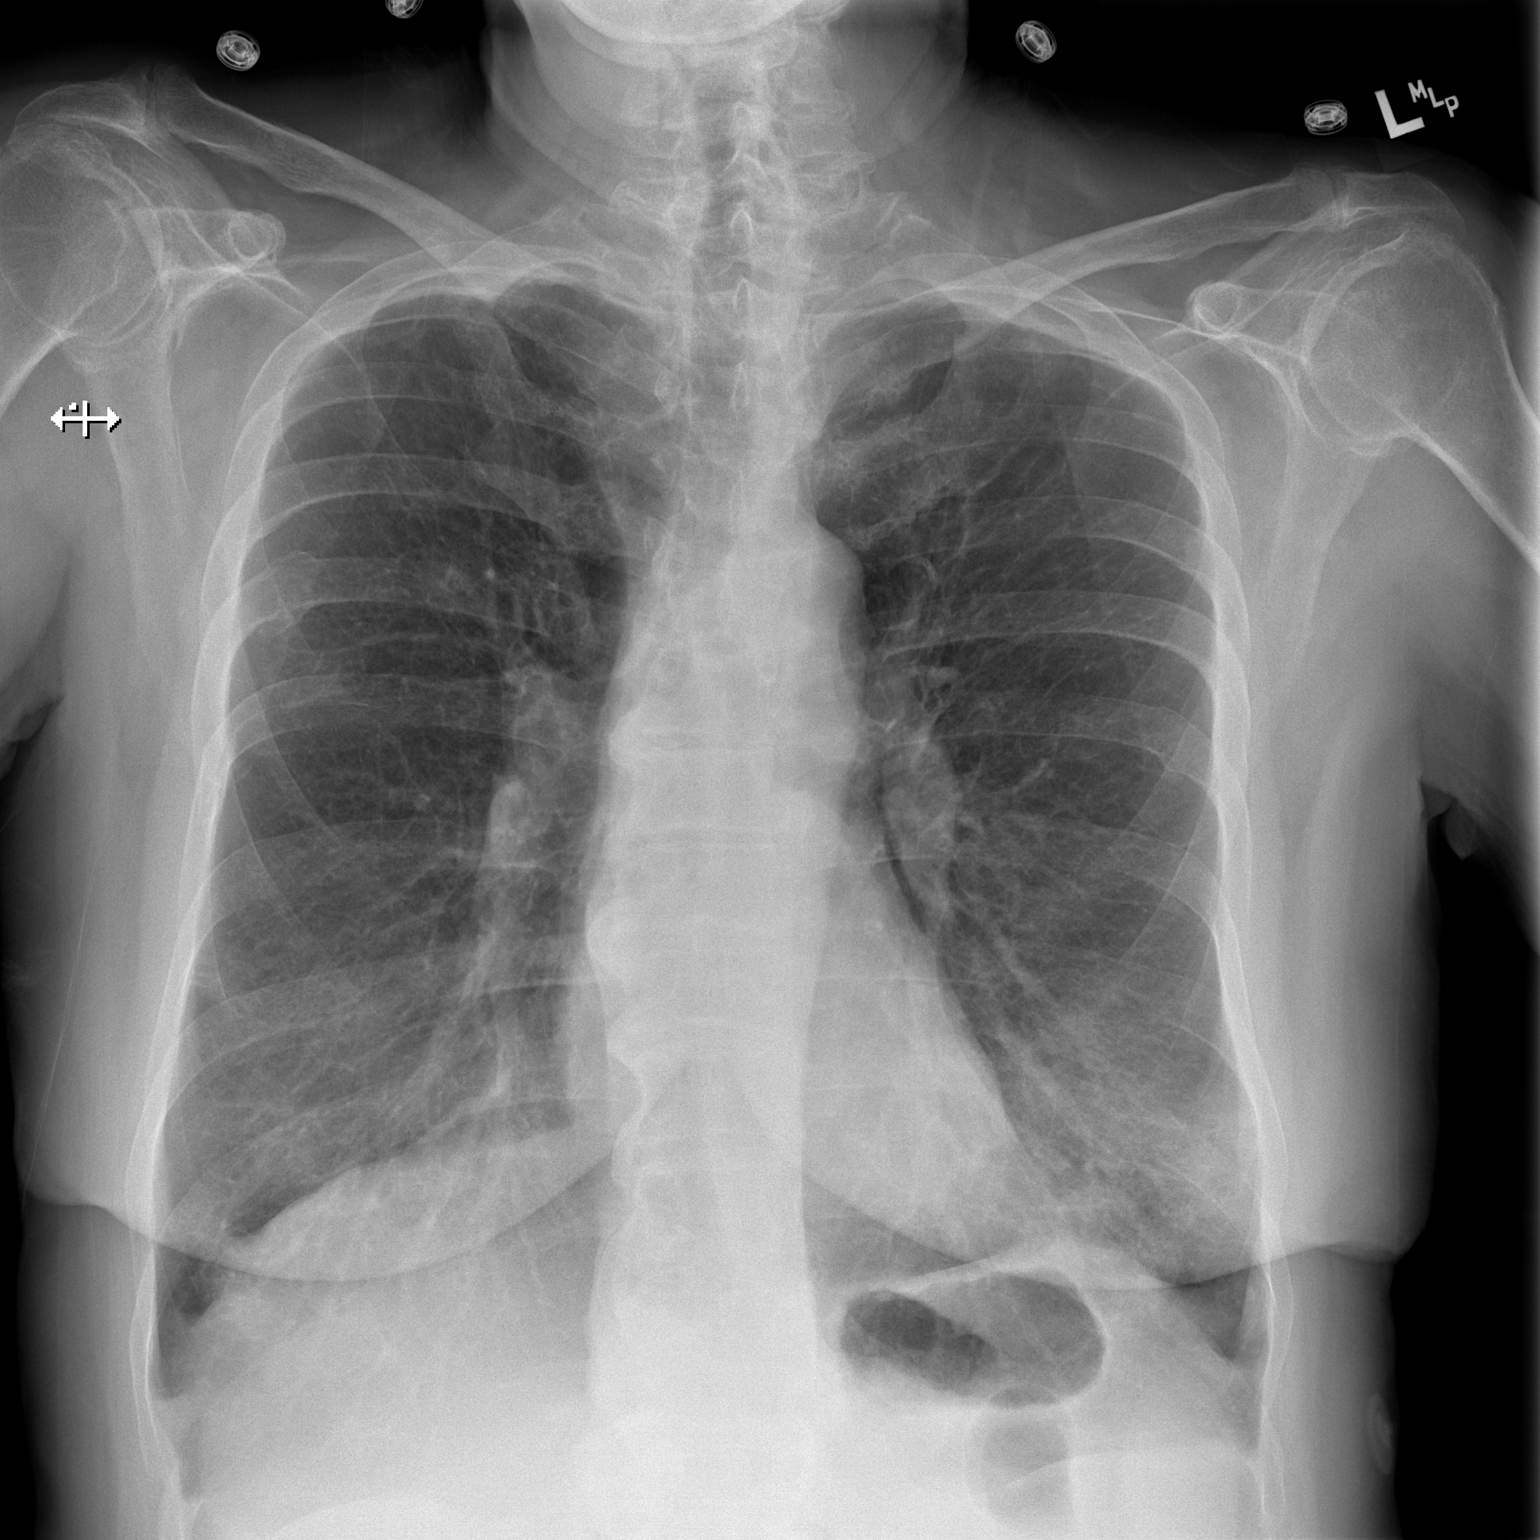

[w chest lat]
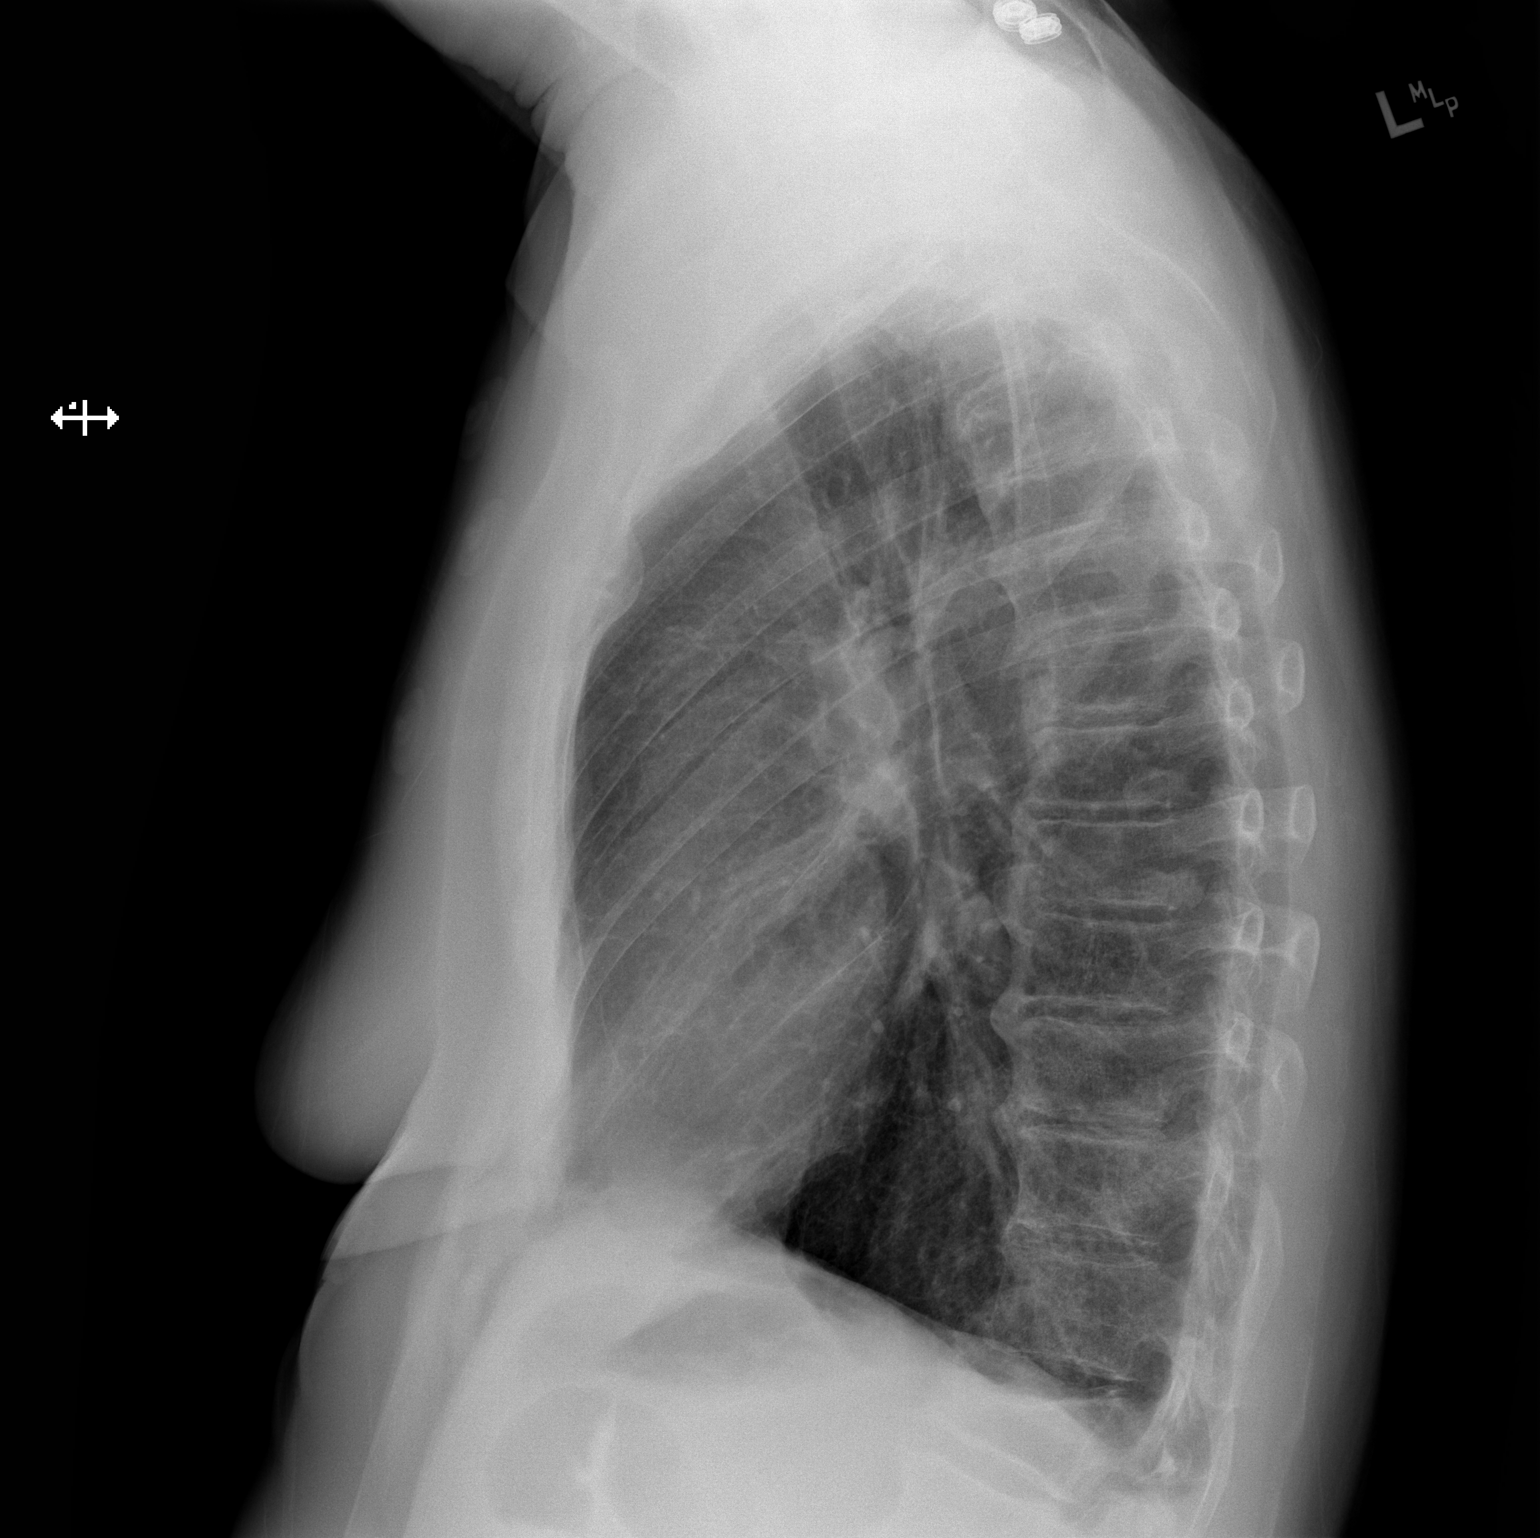

[2 of 2 positions shown; findings below may reference images not displayed]

FINDINGS: There is mild diffuse chronic interstitial coarsening and
bronchiectatic changes. There is no focal consolidation, pleural
effusion, or pneumothorax. The cardiac silhouette is within normal
limits. There is osteopenia with degenerative changes of the spine.
Lower thoracic compression deformity appears similar to the prior
radiograph of [DATE]. Multiple bilateral old healed rib
fractures noted. No acute osseous pathology.
IMPRESSION: No active cardiopulmonary disease.

## 2017-08-17 MED ORDER — METHYLPREDNISOLONE SODIUM SUCC 125 MG IJ SOLR
125.0000 mg | Freq: Once | INTRAMUSCULAR | Status: AC
  Administered 2017-08-17: 125 mg via INTRAVENOUS
  Filled 2017-08-17: qty 2

## 2017-08-17 MED ORDER — ALBUTEROL SULFATE (2.5 MG/3ML) 0.083% IN NEBU
5.0000 mg | INHALATION_SOLUTION | Freq: Once | RESPIRATORY_TRACT | Status: AC
  Administered 2017-08-17: 5 mg via RESPIRATORY_TRACT
  Filled 2017-08-17: qty 6

## 2017-08-17 MED ORDER — LORAZEPAM 2 MG/ML IJ SOLN
1.0000 mg | Freq: Once | INTRAMUSCULAR | Status: AC
Start: 2017-08-17 — End: 2017-08-17
  Administered 2017-08-17: 1 mg via INTRAVENOUS
  Filled 2017-08-17: qty 1

## 2017-08-17 MED ORDER — SODIUM CHLORIDE 0.9 % IV BOLUS (SEPSIS)
1000.0000 mL | Freq: Once | INTRAVENOUS | Status: AC
  Administered 2017-08-17: 1000 mL via INTRAVENOUS

## 2017-08-17 MED ORDER — PREDNISONE 10 MG PO TABS: ORAL_TABLET | ORAL | 0 refills | Status: DC

## 2017-08-17 MED ORDER — IPRATROPIUM BROMIDE 0.02 % IN SOLN
0.5000 mg | Freq: Once | RESPIRATORY_TRACT | Status: AC
  Administered 2017-08-17: 0.5 mg via RESPIRATORY_TRACT
  Filled 2017-08-17: qty 2.5

## 2017-08-17 NOTE — Progress Notes (Signed)
VBG obtained with above/below reportable range noted for Po2.  RBV to Dr. Silverio LayYAO and copy of results hand delivered at 1952.  No new orders received at this time.

## 2017-08-17 NOTE — Discharge Instructions (Signed)
Continue doxycycline.   Take prednisone as prescribed.   Use albuterol every 4 hrs as needed.   Take xanax as needed for anxiety or panic attacks.   See your doctor this week   Return to ER if you have worse chest pain, shortness of breath, fever, vomiting.

## 2017-08-17 NOTE — ED Provider Notes (Signed)
[facility]-EMERGENCY DEPT Provider Note   CSN: [id number] Arrival date & time: 08/17/17  1818     History   Chief Complaint Chief Complaint  Patient presents with  . Shortness of Breath    HPI Judy [name] is a 66 y.o. female history of bipolar, COPD, PTSD here presenting with shortness of breath.  Patient states that she was recently diagnosed with COPD exacerbation and finished prednisone several days ago.  She still felt short of breath so went to her doctor 2 days ago and was prescribed doxycycline.  Patient states that she still has subjective shortness of breath and wheezing so came back for evaluation.  Denies any fevers at home or productive cough.  Patient was noted to be anxious in triage and states that she is having a panic attack now.   The history is provided by the patient.    Past Medical History:  Diagnosis Date  . Bipolar 1 disorder (HCC)   . COPD (chronic obstructive pulmonary disease) (HCC)   . PTSD (post-traumatic stress disorder)     Patient Active Problem List   Diagnosis Date Noted  . COPD with acute exacerbation (HCC) 04/14/2017  . Hyperglycemia 04/14/2017  . Acute on chronic respiratory failure with hypoxia (HCC) 04/13/2017    Past Surgical History:  Procedure Laterality Date  . APPENDECTOMY    . bone spurs foot    . HAND SURGERY Left     OB History    No data available       Home Medications    Prior to Admission medications   Medication Sig Start Date End Date Taking? Authorizing Provider  albuterol (PROVENTIL HFA;VENTOLIN HFA) 108 (90 Base) MCG/ACT inhaler Inhale 2 puffs into the lungs every 6 (six) hours as needed for wheezing or shortness of breath.    [provider]  ALPRAZolam Prudy Feeler(XANAX) 0.5 MG tablet Take 0.5 mg by mouth 2 (two) times daily as needed for anxiety.     [provider]  atorvastatin (LIPITOR) 40 MG tablet Take 40 mg by mouth at bedtime.     [provider]    benztropine (COGENTIN) 2 MG tablet Take 2 mg by mouth 2 (two) times daily.    [provider]  Eszopiclone (ESZOPICLONE) 3 MG TABS Take 3 mg by mouth at bedtime. Take immediately before bedtime    [provider]  gabapentin (NEURONTIN) 300 MG capsule Take 300 mg by mouth at bedtime.    [provider]  guaiFENesin (MUCINEX) 600 MG 12 hr tablet Take 1 tablet (600 mg total) by mouth 2 (two) times daily. 04/22/17   Albertine GratesXu, Fang, MD  ibuprofen (ADVIL,MOTRIN) 200 MG tablet Take 800 mg by mouth every 6 (six) hours as needed for mild pain or moderate pain.    [provider]  ipratropium-albuterol (DUONEB) 0.5-2.5 (3) MG/3ML SOLN Take 3 mLs by nebulization every 6 (six) hours as needed. 04/22/17   Albertine GratesXu, Fang, MD  lurasidone (LATUDA) 80 MG TABS tablet Take 80 mg by mouth at bedtime.     [provider]  montelukast (SINGULAIR) 10 MG tablet Take 1 tablet (10 mg total) by mouth at bedtime. 04/22/17   Albertine GratesXu, Fang, MD  polyethylene glycol [facility](MIRALAX / Ethelene HalGLYCOLAX) packet Take 17 g by mouth daily. 04/22/17   Albertine GratesXu, Fang, MD  predniSONE (DELTASONE) 10 MG tablet Label  & dispense according to the schedule below. 5Pills PO on day one then, 4 Pills PO on day two, 3 Pills PO on  day three, 2 Pills PO on day four,  1 Pills PO on day five,  then STOP. 04/22/17   Albertine GratesXu, Fang, MD  senna-docusate (SENOKOT-S) 8.6-50 MG tablet Take 1 tablet by mouth at bedtime. 04/22/17   Albertine GratesXu, Fang, MD  Vilazodone HCl (VIIBRYD) 40 MG TABS Take 40 mg by mouth daily.     [provider]    Family History Family History  Problem Relation Age of Onset  . CAD Mother 5780  . CAD Father     Social History Social History   Tobacco Use  . Smoking status: Current Every Day Smoker    Packs/day: 0.50    Years: 43.00    Pack years: 21.50    Types: Cigarettes  . Smokeless tobacco: Never Used  Substance Use Topics  . Alcohol use: Yes    Comment: occassionaly  . Drug use: No     Allergies   Celexa  [citalopram hydrobromide]   Review of Systems Review of Systems  Respiratory: Positive for shortness of breath.   All other systems reviewed and are negative.    Physical Exam Updated Vital Signs BP 138/64   Pulse (!) 107   Temp 98.5 F (36.9 C)   Resp 15   SpO2 (S) 93% Comment: Pt's O2 sat on room air while ambulating.  Physical Exam  Constitutional:  Anxious   HENT:  Head: Normocephalic.  Mouth/Throat: Oropharynx is clear and moist.  Eyes: Pupils are equal, round, and reactive to light.  Neck: Normal range of motion.  Cardiovascular: Normal rate.  Pulmonary/Chest:  Slightly tachypneic, some wheezing throughout, no retractions   Abdominal: Soft.  Musculoskeletal: Normal range of motion.       Right lower leg: Normal.       Left lower leg: Normal.  Neurological: She is alert.  Skin: Skin is warm.  Nursing note and vitals reviewed.    ED Treatments / Results  Labs (all labs ordered are listed, but only abnormal results are displayed) Labs Reviewed  CBC WITH DIFFERENTIAL/PLATELET - Abnormal; Notable for the following components:      Result Value   WBC 21.1 (*)    Neutro Abs 18.2 (*)    Monocytes Absolute 1.2 (*)    All other components within normal limits  COMPREHENSIVE METABOLIC PANEL - Abnormal; Notable for the following components:   CO2 17 (*)    Glucose, Bld 113 (*)    Creatinine, Ser 1.03 (*)    Alkaline Phosphatase 141 (*)    GFR calc non Af Amer 56 (*)    Anion gap 18 (*)    All other components within normal limits  BLOOD GAS, VENOUS - Abnormal; Notable for the following components:   Acid-base deficit 5.4 (*)    All other components within normal limits  I-STAT TROPONIN, ED    EKG  EKG Interpretation  Date/Time:  Sunday August 17 2017 18:35:16 EST Ventricular Rate:  97 PR Interval:    QRS Duration: 104 QT Interval:  448 QTC Calculation: 570 R Axis:   67 Text Interpretation:  Sinus rhythm Atrial premature complex Minimal ST  depression, inferior leads Prolonged QT interval poor baseline Confirmed by Richardean CanalYao, David H [phone]([id number]) on 08/17/2017 7:32:24 PM       Radiology Dg Chest 2 View  Result Date: 08/17/2017 CLINICAL DATA:  66 year old female with wheezing. EXAM: CHEST  2 VIEW COMPARISON:  Chest radiograph dated 04/23/2017 FINDINGS: There is mild diffuse chronic interstitial coarsening and bronchiectatic changes. There is no focal  consolidation, pleural effusion, or pneumothorax. The cardiac silhouette is within normal limits. There is osteopenia with degenerative changes of the spine. Lower thoracic compression deformity appears similar to the prior radiograph of 04/23/2017. Multiple bilateral old healed rib fractures noted. No acute osseous pathology. IMPRESSION: No active cardiopulmonary disease. Electronically Signed   By: Elgie Collard M.D.   On: 08/17/2017 19:37    Procedures Procedures (including critical care time)  Medications Ordered in ED Medications  sodium chloride 0.9 % bolus 1,000 mL (0 mLs Intravenous Stopped 08/17/17 2107)  LORazepam (ATIVAN) injection 1 mg (1 mg Intravenous Given 08/17/17 1959)  albuterol (PROVENTIL) (2.5 MG/3ML) 0.083% nebulizer solution 5 mg (5 mg Nebulization Given 08/17/17 1955)  ipratropium (ATROVENT) nebulizer solution 0.5 mg (0.5 mg Nebulization Given 08/17/17 1955)  methylPREDNISolone sodium succinate (SOLU-MEDROL) 125 mg/2 mL injection 125 mg (125 mg Intravenous Given 08/17/17 1958)     Initial Impression / Assessment and Plan / ED Course  I have reviewed the triage vital signs and the nursing notes.  Pertinent labs & imaging results that were available during my care of the patient were reviewed by me and considered in my medical decision making (see chart for details).     Judy [name] is a 67 y.o. female here with SOB, wheezing. Likely COPD exacerbation. Recently finished steroids, on doxycycline currently. She was admitted earlier in the year for COPD  exacerbation. Will get labs, VBG, CXR. Will give nebs, steroids.   9:58 PM VBG showed pH 7.3, CO2 nl. WBC 21 but recently finished steroids and CXR showed no pneumonia. She is on doxycycline already. Ambulated in the ED after nebs, steroids and O2 94%. Will dc home with course of steroids, will have her finish doxycycline.    Final Clinical Impressions(s) / ED Diagnoses   Final diagnoses:  None    ED Discharge Orders    None       Charlynne Pander, MD 08/17/17 2159

## 2017-08-17 NOTE — ED Notes (Signed)
Pt ambulated in the hall without assistance or assistive device---- pt's O2 sat dropped from 99% (at rest) to 93% on room air while ambulating.  Pt has verbalized, "That's what I don't understand, my oxygen remains good but I get short of breath".  Pt's respiratory efforts increased while ambulating.

## 2017-08-17 NOTE — ED Triage Notes (Signed)
Pt complains of wheezing since yesterday. Tried home nebulizer without relief. Pt speaking in full sentences and has no audible wheezing

## 2017-08-22 ENCOUNTER — Encounter (HOSPITAL_COMMUNITY): Payer: Self-pay | Admitting: Emergency Medicine

## 2017-08-22 ENCOUNTER — Other Ambulatory Visit: Payer: Self-pay

## 2017-08-22 ENCOUNTER — Emergency Department (HOSPITAL_COMMUNITY): Payer: Medicare Other

## 2017-08-22 ENCOUNTER — Inpatient Hospital Stay (HOSPITAL_COMMUNITY)
Admission: EM | Admit: 2017-08-22 | Discharge: 2017-08-25 | DRG: 190 | Disposition: A | Discharge status: Home / Self Care | Payer: Medicare Other | Attending: Internal Medicine | Admitting: Internal Medicine

## 2017-08-22 DIAGNOSIS — F17210 Nicotine dependence, cigarettes, uncomplicated: Secondary | ICD-10-CM | POA: Diagnosis present

## 2017-08-22 DIAGNOSIS — J440 Chronic obstructive pulmonary disease with acute lower respiratory infection: Secondary | ICD-10-CM | POA: Diagnosis not present

## 2017-08-22 DIAGNOSIS — F4310 Post-traumatic stress disorder, unspecified: Secondary | ICD-10-CM | POA: Diagnosis present

## 2017-08-22 DIAGNOSIS — F319 Bipolar disorder, unspecified: Secondary | ICD-10-CM | POA: Diagnosis present

## 2017-08-22 DIAGNOSIS — Z7952 Long term (current) use of systemic steroids: Secondary | ICD-10-CM

## 2017-08-22 DIAGNOSIS — J189 Pneumonia, unspecified organism: Secondary | ICD-10-CM | POA: Diagnosis present

## 2017-08-22 DIAGNOSIS — J181 Lobar pneumonia, unspecified organism: Secondary | ICD-10-CM | POA: Diagnosis not present

## 2017-08-22 DIAGNOSIS — Z79899 Other long term (current) drug therapy: Secondary | ICD-10-CM

## 2017-08-22 DIAGNOSIS — Z880 Allergy status to penicillin: Secondary | ICD-10-CM

## 2017-08-22 DIAGNOSIS — J441 Chronic obstructive pulmonary disease with (acute) exacerbation: Secondary | ICD-10-CM | POA: Diagnosis present

## 2017-08-22 HISTORY — DX: Anxiety disorder, unspecified: F41.9

## 2017-08-22 LAB — I-STAT TROPONIN, ED: Troponin i, poc: 0.01 ng/mL (ref 0.00–0.08)

## 2017-08-22 LAB — D-DIMER, QUANTITATIVE (NOT AT ARMC): D-Dimer, Quant: 1.34 ug/mL-FEU — ABNORMAL HIGH (ref 0.00–0.50)

## 2017-08-22 IMAGING — CR DG CHEST 2V
2 series · 2 of 2 positions shown · non-contrast
Comparison: Chest radiograph dated [DATE]

CLINICAL DATA: 66-year-old female with shortness of breath. Smoker.

EXAM:
CHEST  2 VIEW

[w chest pa]
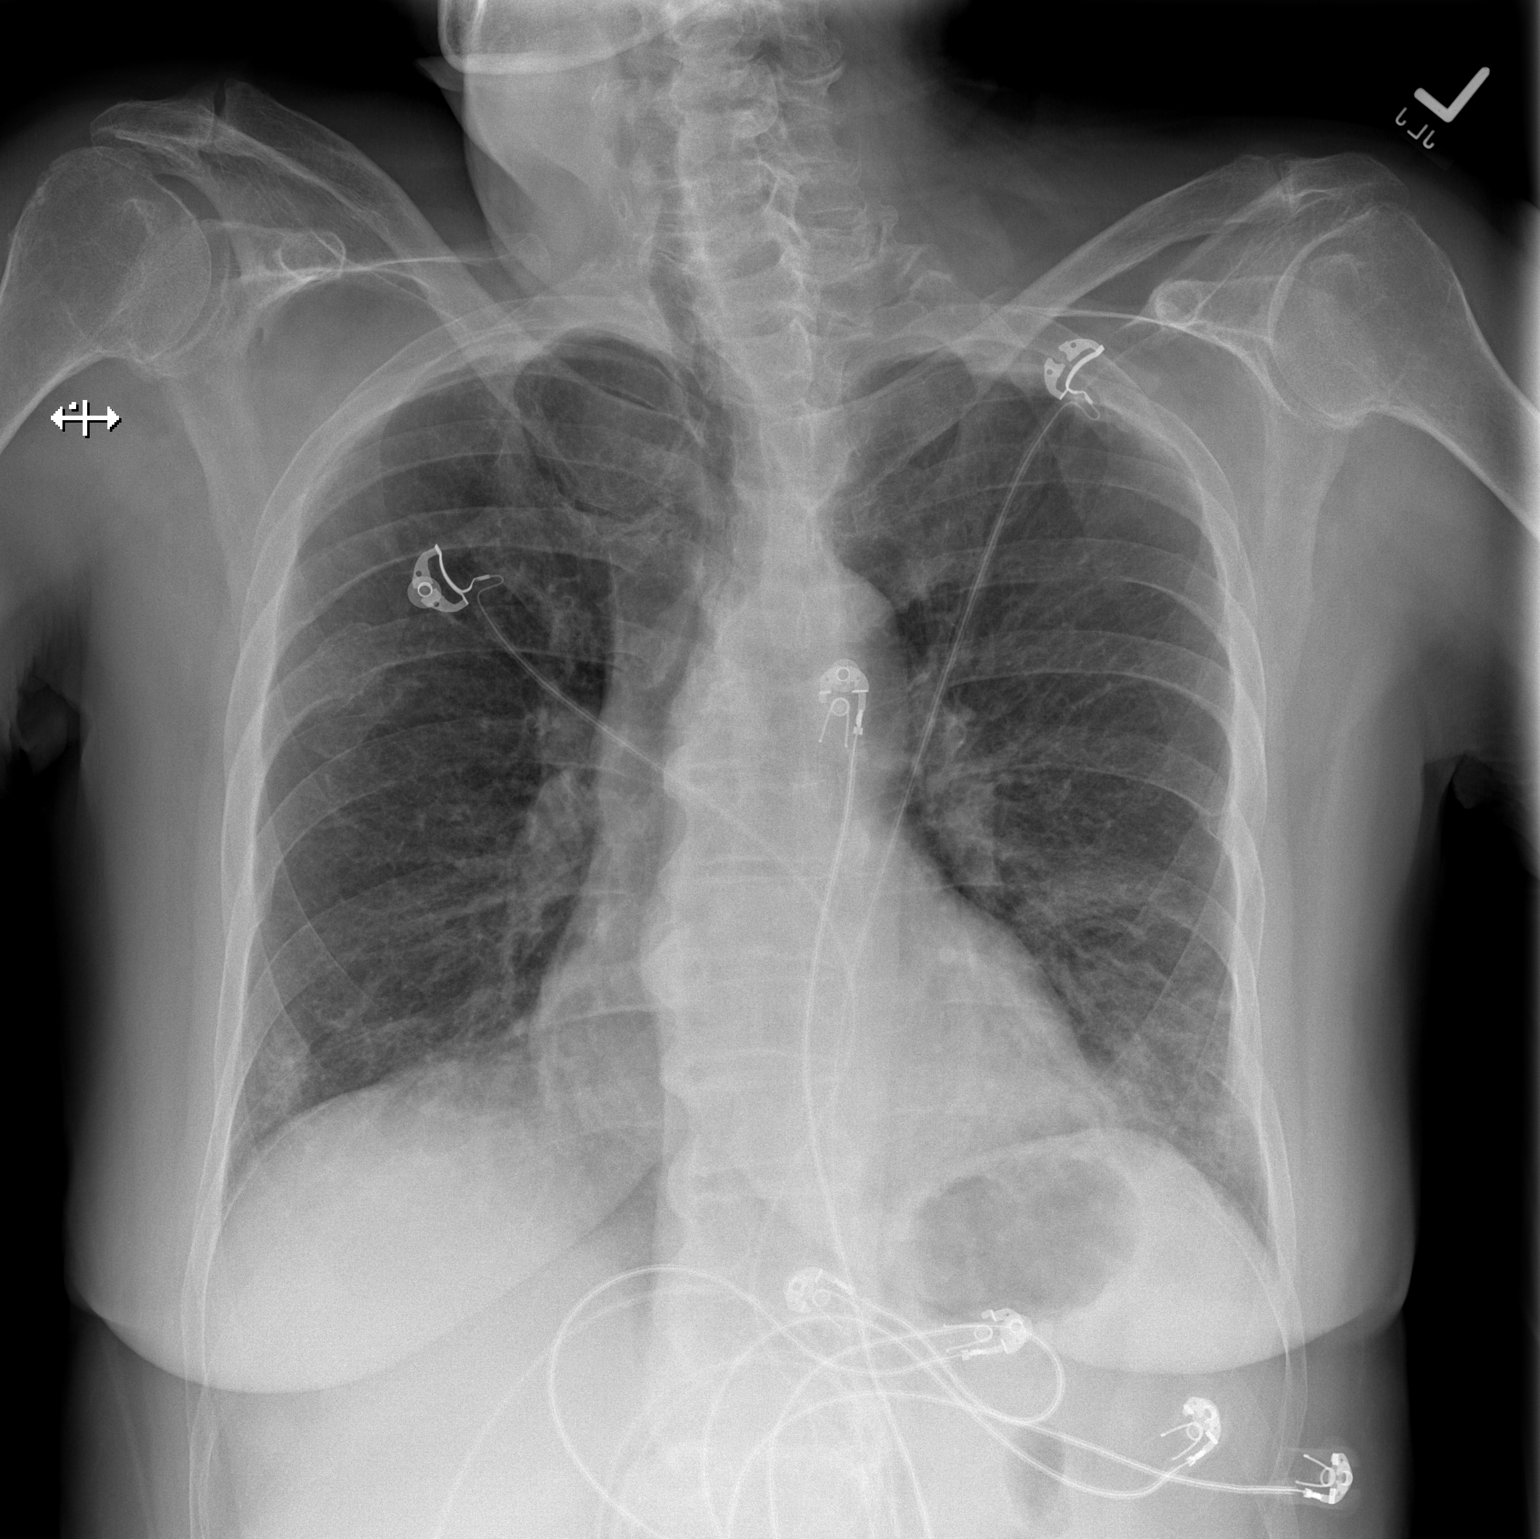

[w chest lat]
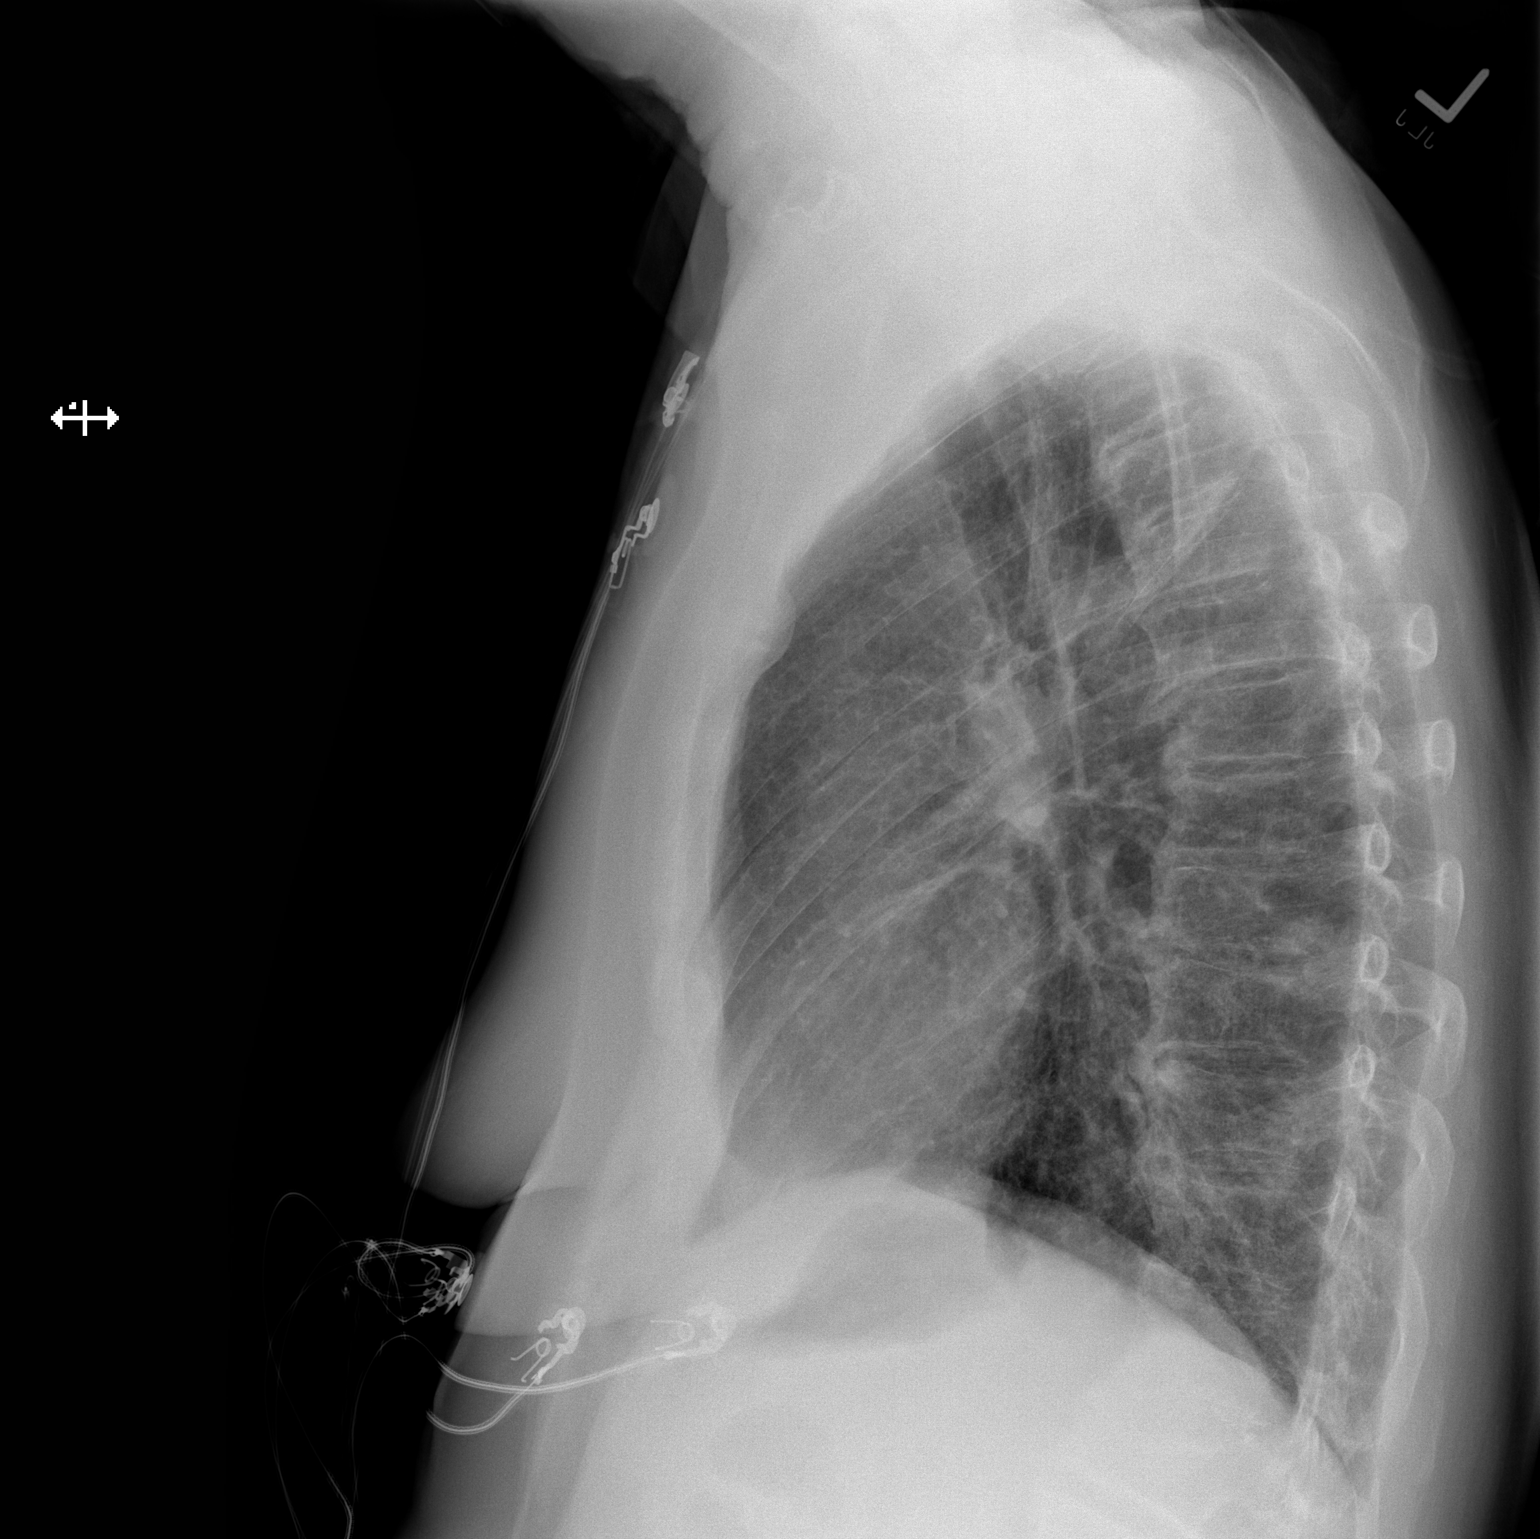

[2 of 2 positions shown; findings below may reference images not displayed]

FINDINGS: Mild diffuse interstitial coarsening and bronchiectatic changes.
There are bibasilar linear atelectasis/ scarring. There is no focal
consolidation, pleural effusion, or pneumothorax. The cardiac
silhouette is within normal limits. There is osteopenia with
degenerative changes of the spine. The patient of the humeral heads
consistent with chronic rotator cuff injury. Bilateral old healed
posterior rib fractures. No acute osseous pathology.
IMPRESSION: No active cardiopulmonary disease.

## 2017-08-22 MED ORDER — PREDNISONE 20 MG PO TABS
60.0000 mg | ORAL_TABLET | Freq: Once | ORAL | Status: AC
  Administered 2017-08-22: 60 mg via ORAL
  Filled 2017-08-22: qty 3

## 2017-08-22 MED ORDER — SODIUM CHLORIDE 0.9 % IV BOLUS (SEPSIS)
500.0000 mL | Freq: Once | INTRAVENOUS | Status: AC
  Administered 2017-08-22: 500 mL via INTRAVENOUS

## 2017-08-22 MED ORDER — LORAZEPAM 2 MG/ML IJ SOLN
1.0000 mg | Freq: Once | INTRAMUSCULAR | Status: AC
  Administered 2017-08-22: 1 mg via INTRAVENOUS
  Filled 2017-08-22: qty 1

## 2017-08-22 MED ORDER — IPRATROPIUM-ALBUTEROL 0.5-2.5 (3) MG/3ML IN SOLN
3.0000 mL | Freq: Once | RESPIRATORY_TRACT | Status: AC
  Administered 2017-08-22: 3 mL via RESPIRATORY_TRACT
  Filled 2017-08-22: qty 3

## 2017-08-22 NOTE — ED Provider Notes (Signed)
[facility]-EMERGENCY DEPT Provider Note   CSN: [id number] Arrival date & time: 08/22/17  2009     History   Chief Complaint Chief Complaint  Patient presents with  . Anxiety  . Hyperventilating    HPI Judy [name] is a 66 y.o. female with history of anxiety,  COPD and tobacco abuse presents for sudden onset shortness of breath, chest tightness, throat tightness, difficulty swallowing since 3 PM today. Associated symptoms include increased anxiety, dry mouth and throat. States her throat feels tight and it is harder to swallow. She has not eaten in the last 24 hours and has not been exposed to anything out of the ordinary to cause an allergic reaction. She denies facial swelling, tongue swelling or generalized rash.  Describes shortness of breath as chest tightness, breathing faster.  Five days ago psychiatrist changed ativan to xanax. Was given haldol and albuterol by EMS which did not help. Last had xanax more than 6 hours ago.   Chart reviews pt was seen in ED for shortness of breath 5 days ago, her workup was benign and she was discharged with steroids. Recently finished a round of doxycycline for COPD exacerbation. Cough and sputum have improved.   No fevers, chills, CP, nausea, vomiting, abdominal pain. No ho DVT/PE, malignancy, recent prolonged travel or immobilization.   HPI  Past Medical History:  Diagnosis Date  . Anxiety   . Bipolar 1 disorder (HCC)   . COPD (chronic obstructive pulmonary disease) (HCC)   . PTSD (post-traumatic stress disorder)     Patient Active Problem List   Diagnosis Date Noted  . COPD with acute exacerbation (HCC) 04/14/2017  . Hyperglycemia 04/14/2017  . Acute on chronic respiratory failure with hypoxia (HCC) 04/13/2017    Past Surgical History:  Procedure Laterality Date  . APPENDECTOMY    . bone spurs foot    . HAND SURGERY Left     OB History    No data available       Home Medications    Prior to  Admission medications   Medication Sig Start Date End Date Taking? Authorizing Provider  albuterol (PROVENTIL HFA;VENTOLIN HFA) 108 (90 Base) MCG/ACT inhaler Inhale 2 puffs into the lungs every 6 (six) hours as needed for wheezing or shortness of breath.   Yes [provider]  benztropine (COGENTIN) 1 MG tablet Take 1 mg at bedtime by mouth. 05/31/17  Yes [provider]  doxycycline (VIBRA-TABS) 100 MG tablet Take 100 mg 2 (two) times daily by mouth. 08/11/17  Yes [provider]  Eszopiclone (ESZOPICLONE) 3 MG TABS Take 3 mg by mouth at bedtime. Take immediately before bedtime   Yes [provider]  guaiFENesin (MUCINEX) 600 MG 12 hr tablet Take 1 tablet (600 mg total) by mouth 2 (two) times daily. Patient taking differently: Take 600 mg 2 (two) times daily as needed by mouth for cough.  04/22/17  Yes Albertine GratesXu, Fang, MD  ipratropium-albuterol (DUONEB) 0.5-2.5 (3) MG/3ML SOLN Take 3 mLs by nebulization every 6 (six) hours as needed. Patient taking differently: Take 3 mLs every 6 (six) hours as needed by nebulization (sob and wheezing).  04/22/17  Yes Albertine GratesXu, Fang, MD  Lurasidone HCl (LATUDA) 60 MG TABS Take 1 tablet at bedtime by mouth.   Yes [provider]  predniSONE (DELTASONE) 10 MG tablet Label  & dispense according to the schedule below. 5Pills PO on day one then, 4 Pills PO on day two, 3 Pills PO on day three,  2 Pills PO on day four,  1 Pills PO on day five,  then STOP. 08/17/17  Yes Charlynne Pander, MD  Vilazodone HCl (VIIBRYD) 40 MG TABS Take 40 mg by mouth daily.    Yes [provider]  ALPRAZolam Prudy Feeler) 0.5 MG tablet Take 0.5 mg by mouth 2 (two) times daily as needed for anxiety.     [provider]  atorvastatin (LIPITOR) 40 MG tablet Take 40 mg by mouth at bedtime.     [provider]  benztropine (COGENTIN) 2 MG tablet Take 2 mg by mouth 2 (two) times daily.    [provider]  gabapentin (NEURONTIN) 300 MG capsule  Take 300 mg by mouth at bedtime.    [provider]  ibuprofen (ADVIL,MOTRIN) 200 MG tablet Take 800 mg by mouth every 6 (six) hours as needed for mild pain or moderate pain.    [provider]  LORazepam (ATIVAN) 0.5 MG tablet Take 0.5 mg 4 (four) times daily by mouth. 08/19/17   [provider]  lurasidone (LATUDA) 80 MG TABS tablet Take 80 mg by mouth at bedtime.     [provider]  montelukast (SINGULAIR) 10 MG tablet Take 1 tablet (10 mg total) by mouth at bedtime. Patient not taking: Reported on 08/22/2017 04/22/17   Albertine Grates, MD  polyethylene glycol [facility] / Ethelene Hal) packet Take 17 g by mouth daily. Patient not taking: Reported on 08/22/2017 04/22/17   Albertine Grates, MD  senna-docusate (SENOKOT-S) 8.6-50 MG tablet Take 1 tablet by mouth at bedtime. Patient taking differently: Take 1 tablet at bedtime as needed by mouth for mild constipation.  04/22/17   Albertine Grates, MD    Family History Family History  Problem Relation Age of Onset  . CAD Mother 34  . CAD Father     Social History Social History   Tobacco Use  . Smoking status: Current Every Day Smoker    Packs/day: 0.50    Years: 43.00    Pack years: 21.50    Types: Cigarettes  . Smokeless tobacco: Never Used  Substance Use Topics  . Alcohol use: Yes    Comment: occassionaly  . Drug use: No     Allergies   Celexa [citalopram hydrobromide]   Review of Systems Review of Systems  HENT: Positive for trouble swallowing.   Respiratory: Positive for shortness of breath.   Psychiatric/Behavioral: The patient is nervous/anxious.   All other systems reviewed and are negative.    Physical Exam Updated Vital Signs BP (!) 165/82 (BP Location: Right Arm)   Pulse (!) 42   Temp 98.2 F (36.8 C) (Oral)   Resp 18   SpO2 99%   Physical Exam  Constitutional: She is oriented to person, place, and time. She appears well-developed and well-nourished. No distress.  Patient appears very anxious.  Is rocking back and forth in bed.   HENT:  Head: Normocephalic and atraumatic.  Right Ear: External ear normal.  Left Ear: External ear normal.  Nose: Nose normal.  Dry mucous membranes  Oropharynx and tonsils normal without edema No trismus or drooling No facial, tongue or anterior neck swelling  Eyes: Conjunctivae and EOM are normal. No scleral icterus.  Neck: Normal range of motion. Neck supple.  Cardiovascular: Normal rate, regular rhythm, normal heart sounds and intact distal pulses.  No murmur heard. Pulses:      Radial pulses are 2+ on the right side, and 2+ on the left side.  Dorsalis pedis pulses are 2+ on the right side, and 2+ on the left side.  No LE edema Calves are supple and non tender  Pulmonary/Chest: Effort normal. She has no decreased breath sounds. She has wheezes in the right lower field and the left lower field.  Talking in full sentences but taking short, shallow breaths.  No tachypnea, hypoxia Faint wheezing to lower lobes bilaterally  Abdominal: Soft. There is no tenderness.  Musculoskeletal: Normal range of motion. She exhibits no deformity.  Neurological: She is alert and oriented to person, place, and time.  Skin: Skin is warm and dry. Capillary refill takes less than 2 seconds.  Psychiatric: Her speech is normal and behavior is normal. Judgment and thought content normal. Her mood appears anxious.  Anxious appearing.   Nursing note and vitals reviewed.    ED Treatments / Results  Labs (all labs ordered are listed, but only abnormal results are displayed) Labs Reviewed  CBC WITH DIFFERENTIAL/PLATELET  BASIC METABOLIC PANEL  BRAIN NATRIURETIC PEPTIDE  D-DIMER, QUANTITATIVE (NOT [facility])  BLOOD GAS, VENOUS  I-STAT TROPONIN, ED    EKG  EKG Interpretation  Date/Time:  Friday August 22 2017 20:21:35 EST Ventricular Rate:  99 PR Interval:    QRS Duration: 90 QT Interval:  438 QTC Calculation: 459 R Axis:   63 Text Interpretation:   Sinus rhythm Multiple premature complexes, vent & supraven Nonspecific T abnrm, anterolateral leads agree.no STEMI Confirmed by Arby BarrettePfeiffer, Marcy [phone]([id number]) on 08/22/2017 9:52:08 PM       Radiology Dg Chest 2 View  Result Date: 08/22/2017 CLINICAL DATA:  66 year old female with shortness of breath. Smoker. EXAM: CHEST  2 VIEW COMPARISON:  Chest radiograph dated 08/17/2017 FINDINGS: Mild diffuse interstitial coarsening and bronchiectatic changes. There are bibasilar linear atelectasis/ scarring. There is no focal consolidation, pleural effusion, or pneumothorax. The cardiac silhouette is within normal limits. There is osteopenia with degenerative changes of the spine. The patient of the humeral heads consistent with chronic rotator cuff injury. Bilateral old healed posterior rib fractures. No acute osseous pathology. IMPRESSION: No active cardiopulmonary disease. Electronically Signed   By: Elgie CollardArash  Radparvar M.D.   On: 08/22/2017 23:22    Procedures Procedures (including critical care time)  Medications Ordered in ED Medications  ipratropium-albuterol (DUONEB) 0.5-2.5 (3) MG/3ML nebulizer solution 3 mL (3 mLs Nebulization Given 08/22/17 2214)  LORazepam (ATIVAN) injection 1 mg (1 mg Intravenous Given 08/22/17 2328)  predniSONE (DELTASONE) tablet 60 mg (60 mg Oral Given 08/22/17 2327)  sodium chloride 0.9 % bolus 500 mL (500 mLs Intravenous New Bag/Given 08/22/17 2329)     Initial Impression / Assessment and Plan / ED Course  I have reviewed the triage vital signs and the nursing notes.  Pertinent labs & imaging results that were available during my care of the patient were reviewed by me and considered in my medical decision making (see chart for details).  Clinical Course as of Aug 22 2337  Caleen EssexFri Aug 22, 2017  2323 Re-evaluated patient after duoneb, wheezing resolved. She reports worsening anxiety. She continues to take short shallow rapid breaths and looks anxious. RN administering ativan IV  now.   [CG]    Clinical Course User Index [CG] Liberty HandyGibbons, Aleza Pew J, PA-C  66 year old female with history of COPD, tobacco abuse, HLD, anxiety presents for sudden onset shortness of breath and chest tightness onset today at 3 PM. Associated symptoms include dry mouth and throat, difficulty swallowing, feels like her throat is tight and increased anxiety. Recent benzodiazepine  that she has been taken for many years was changed 5 days ago. Last took xanax 6 hours ago.   On exam, she has no tachypnea or hypoxia on room air. She is taking short and quick shallow breaths. She looks very anxious. Faint wheezing to lower lobes bilaterally.   Patient given duoneb which cleared wheezing. She still looks anxious. Awaiting IV. Wells score is low. High suspicion for anxiety contributing and less likely PNA vs ACS vs PE. Patient handed off to oncoming EDPA Dahlia Client who will f/u labs. CXR and EKG unremarkable.   Final Clinical Impressions(s) / ED Diagnoses   Final diagnoses:  None    ED Discharge Orders    None       Jerrell Mylar 08/22/17 2338    Arby Barrette, MD 08/24/17 [phone]

## 2017-08-22 NOTE — ED Notes (Signed)
Patient walked and oxygen saturations stayed at 95%.

## 2017-08-22 NOTE — ED Notes (Signed)
Patient walked to bathroom across from pt room. Tolerated well.

## 2017-08-22 NOTE — ED Notes (Signed)
Bed: WA17 Expected date:  Expected time:  Means of arrival:  Comments: EMS 66 yo female from home/stress and anxiety-wheezing-given neb 160/100 HR 90's EKG normal Haldol 2.5 mg

## 2017-08-22 NOTE — ED Triage Notes (Addendum)
Pt BIB [facility] from home for respiratory distress and anxiety. Pt experiencing  Tachypnea, and mild expiratory wheezing. Pts breathing controled with coaching but continues to increase as pt becomes more anxious. Pt reports difficulty swallowing.  Pt recently admitted to hospital and has been on prednisone, also had recent med change from xanax to ativan. Pt reports taking ativan around 1800, pt given 5 of albuterol as well as total of 5mg  haldol by EMS. Pt denies pain or chest pain. Daughter has been notified.

## 2017-08-23 ENCOUNTER — Emergency Department (HOSPITAL_COMMUNITY): Payer: Medicare Other

## 2017-08-23 ENCOUNTER — Encounter (HOSPITAL_COMMUNITY): Payer: Self-pay | Admitting: Radiology

## 2017-08-23 ENCOUNTER — Other Ambulatory Visit: Payer: Self-pay

## 2017-08-23 DIAGNOSIS — Z720 Tobacco use: Secondary | ICD-10-CM | POA: Diagnosis not present

## 2017-08-23 DIAGNOSIS — F4310 Post-traumatic stress disorder, unspecified: Secondary | ICD-10-CM | POA: Diagnosis present

## 2017-08-23 DIAGNOSIS — J189 Pneumonia, unspecified organism: Secondary | ICD-10-CM | POA: Diagnosis present

## 2017-08-23 DIAGNOSIS — Z7952 Long term (current) use of systemic steroids: Secondary | ICD-10-CM | POA: Diagnosis not present

## 2017-08-23 DIAGNOSIS — J181 Lobar pneumonia, unspecified organism: Secondary | ICD-10-CM | POA: Diagnosis present

## 2017-08-23 DIAGNOSIS — Z880 Allergy status to penicillin: Secondary | ICD-10-CM | POA: Diagnosis not present

## 2017-08-23 DIAGNOSIS — Z79899 Other long term (current) drug therapy: Secondary | ICD-10-CM | POA: Diagnosis not present

## 2017-08-23 DIAGNOSIS — J440 Chronic obstructive pulmonary disease with acute lower respiratory infection: Secondary | ICD-10-CM | POA: Diagnosis present

## 2017-08-23 DIAGNOSIS — J441 Chronic obstructive pulmonary disease with (acute) exacerbation: Secondary | ICD-10-CM | POA: Diagnosis present

## 2017-08-23 DIAGNOSIS — F17210 Nicotine dependence, cigarettes, uncomplicated: Secondary | ICD-10-CM | POA: Diagnosis present

## 2017-08-23 DIAGNOSIS — F319 Bipolar disorder, unspecified: Secondary | ICD-10-CM | POA: Diagnosis present

## 2017-08-23 DIAGNOSIS — D72829 Elevated white blood cell count, unspecified: Secondary | ICD-10-CM | POA: Diagnosis not present

## 2017-08-23 LAB — CBC WITH DIFFERENTIAL/PLATELET
Basophils Absolute: 0 10*3/uL (ref 0.0–0.1)
Basophils Relative: 0 %
Eosinophils Absolute: 0 10*3/uL (ref 0.0–0.7)
Eosinophils Relative: 0 %
HCT: 38.7 % (ref 36.0–46.0)
Hemoglobin: 13.4 g/dL (ref 12.0–15.0)
Lymphocytes Relative: 6 %
Lymphs Abs: 0.6 10*3/uL — ABNORMAL LOW (ref 0.7–4.0)
MCH: 29.3 pg (ref 26.0–34.0)
MCHC: 34.6 g/dL (ref 30.0–36.0)
MCV: 84.5 fL (ref 78.0–100.0)
Monocytes Absolute: 0.3 10*3/uL (ref 0.1–1.0)
Monocytes Relative: 3 %
Neutro Abs: 9.8 10*3/uL — ABNORMAL HIGH (ref 1.7–7.7)
Neutrophils Relative %: 91 %
Platelets: 261 10*3/uL (ref 150–400)
RBC: 4.58 MIL/uL (ref 3.87–5.11)
RDW: 12.8 % (ref 11.5–15.5)
WBC: 10.7 10*3/uL — ABNORMAL HIGH (ref 4.0–10.5)

## 2017-08-23 LAB — URINALYSIS, ROUTINE W REFLEX MICROSCOPIC
Bacteria, UA: NONE SEEN
Bilirubin Urine: NEGATIVE
Glucose, UA: NEGATIVE mg/dL
Ketones, ur: NEGATIVE mg/dL
Leukocytes, UA: NEGATIVE
Nitrite: NEGATIVE
Protein, ur: NEGATIVE mg/dL
Specific Gravity, Urine: >1.046 — ABNORMAL HIGH (ref 1.005–1.030)
pH: 5 (ref 5.0–8.0)

## 2017-08-23 LAB — BASIC METABOLIC PANEL
Anion gap: 14 (ref 5–15)
BUN: 12 mg/dL (ref 6–20)
CO2: 17 mmol/L — ABNORMAL LOW (ref 22–32)
Calcium: 9.4 mg/dL (ref 8.9–10.3)
Chloride: 108 mmol/L (ref 101–111)
Creatinine, Ser: 0.94 mg/dL (ref 0.44–1.00)
GFR calc Af Amer: >60 mL/min (ref >60)
GFR calc non Af Amer: >60 mL/min (ref >60)
Glucose, Bld: 140 mg/dL — ABNORMAL HIGH (ref 65–99)
Potassium: 3.3 mmol/L — ABNORMAL LOW (ref 3.5–5.1)
Sodium: 139 mmol/L (ref 135–145)

## 2017-08-23 LAB — HIV ANTIBODY (ROUTINE TESTING W REFLEX): HIV Screen 4th Generation wRfx: NONREACTIVE

## 2017-08-23 LAB — BRAIN NATRIURETIC PEPTIDE: B Natriuretic Peptide: 62.9 pg/mL (ref 0.0–100.0)

## 2017-08-23 LAB — STREP PNEUMONIAE URINARY ANTIGEN: Strep Pneumo Urinary Antigen: NEGATIVE

## 2017-08-23 IMAGING — CT CT ANGIO CHEST
2 of 7 series · 18 of 46 positions shown · IV contrast (iopamidol)
Comparison: Chest radiograph performed [DATE], and CTA of the
chest performed [DATE]

CLINICAL DATA: Acute onset of shortness of breath, tachypnea and
mild expiratory wheezing. Difficulty swallowing.

EXAM:
CT ANGIOGRAPHY CHEST WITH CONTRAST
TECHNIQUE: Multidetector CT imaging of the chest was performed using the
standard protocol during bolus administration of intravenous
contrast. Multiplanar CT image reconstructions and MIPs were
obtained to evaluate the vascular anatomy.
CONTRAST:  100mL [AN] IOPAMIDOL ([AN]) INJECTION 76%

[Series 6: thins for pacs · axial · 0.74mm/px · z∈[-359,-73]mm · 15 of 314 slices shown]
[im 14/314  lung]
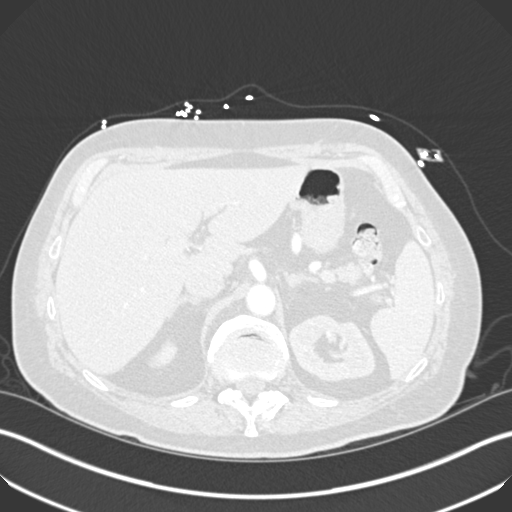
[im 41/314  soft-tissue]
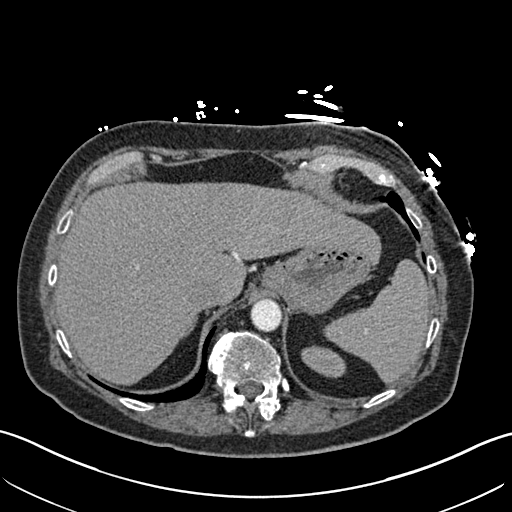
[im 55/314  lung]
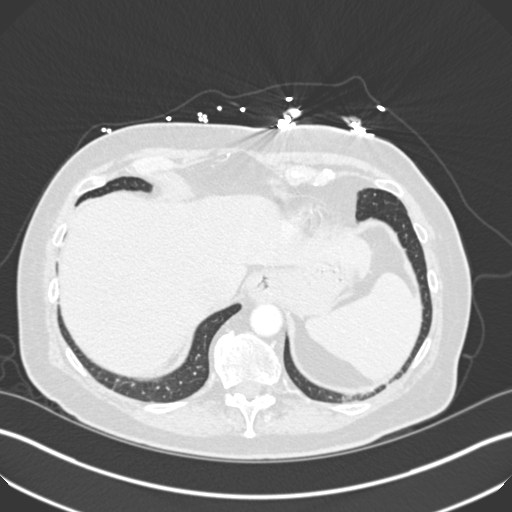
[im 82/314  soft-tissue]
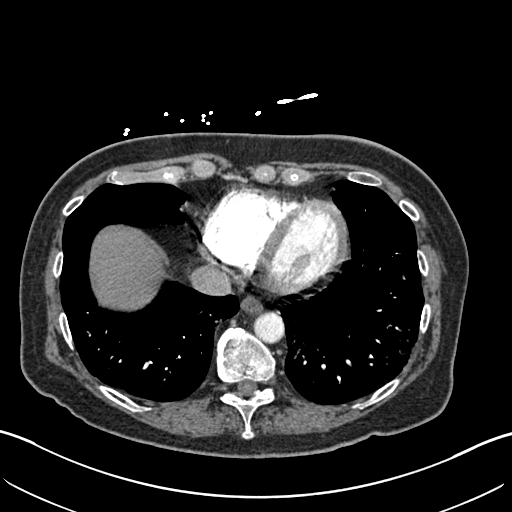
[im 96/314  lung]
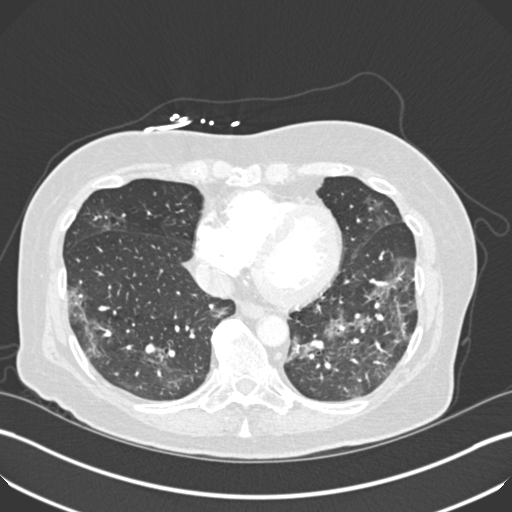
[im 123/314  soft-tissue]
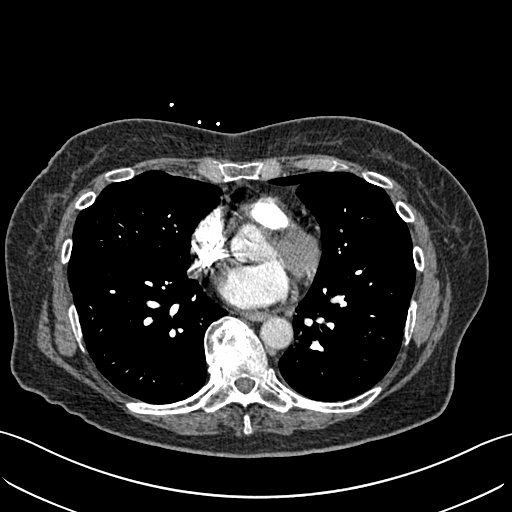
[im 137/314  lung]
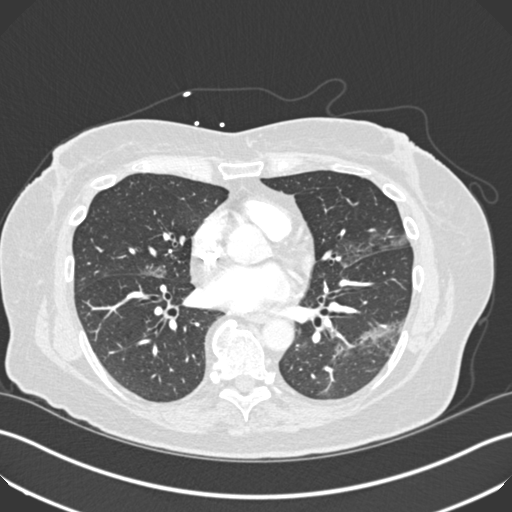
[im 164/314  soft-tissue]
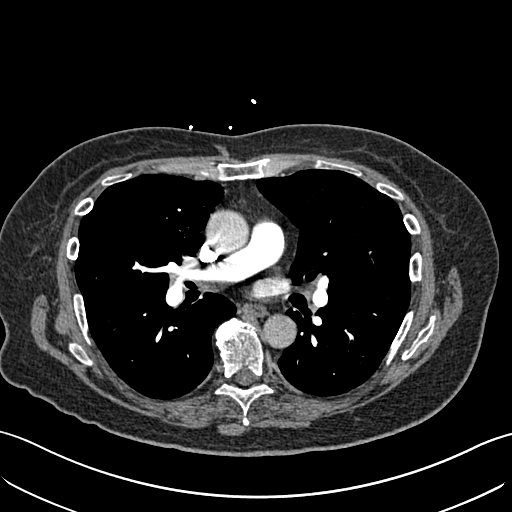
[im 177/314  lung]
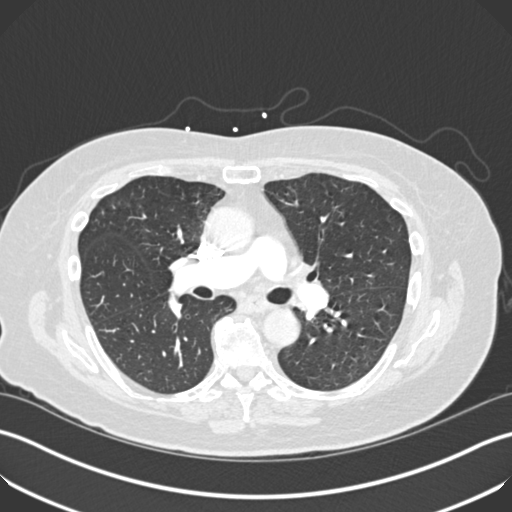
[im 191/314  soft-tissue]
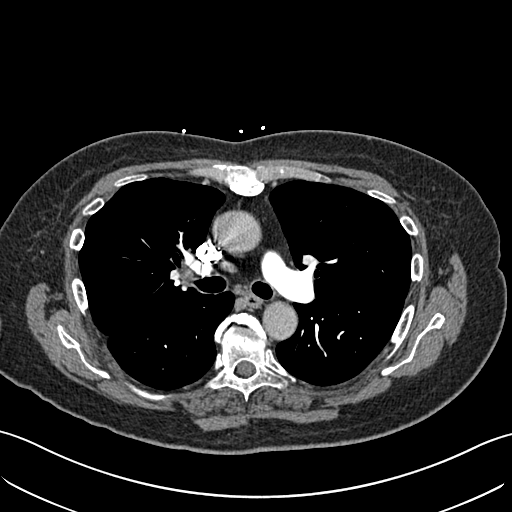
[im 218/314  lung]
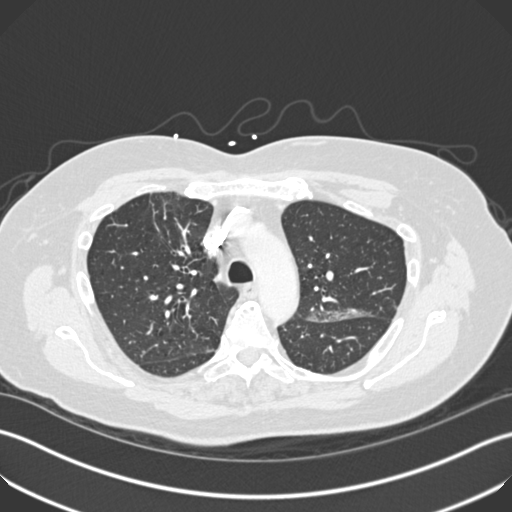
[im 232/314  soft-tissue]
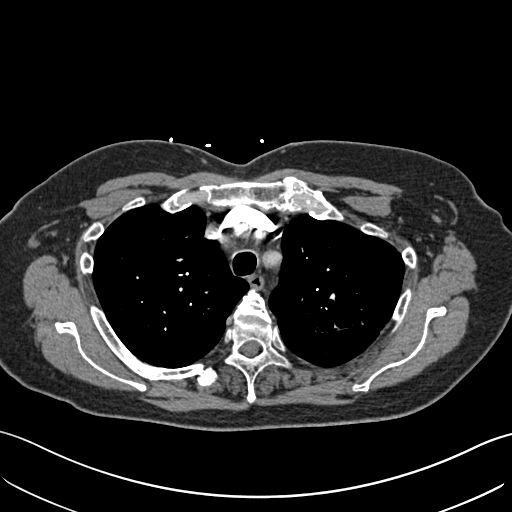
[im 259/314  lung]
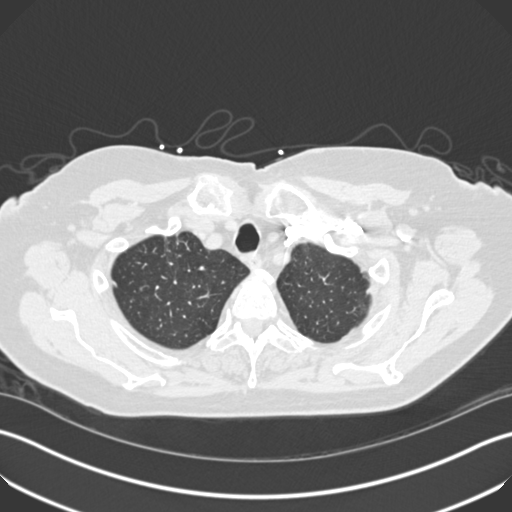
[im 273/314  soft-tissue]
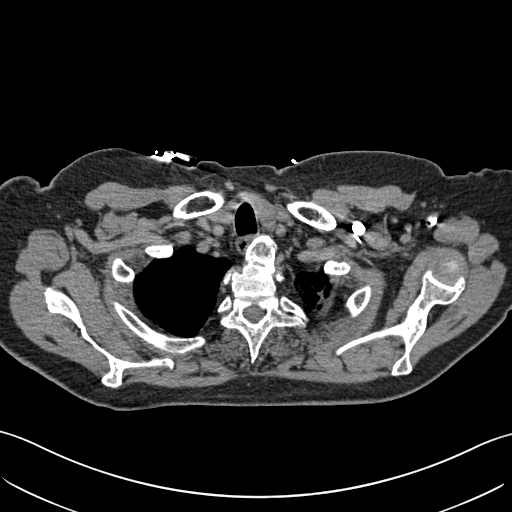
[im 300/314  lung]
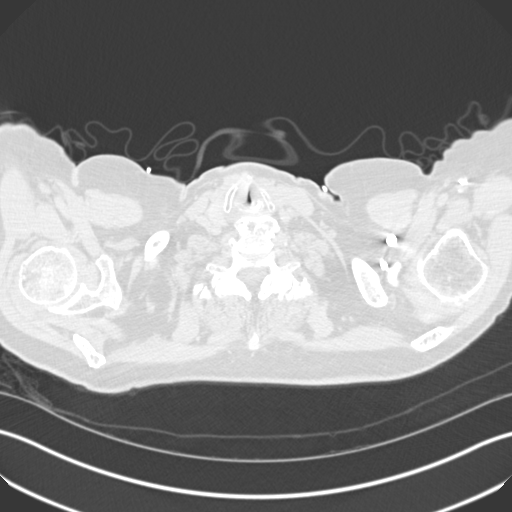

[Series 8: coronal mpr · coronal · 0.65mm/px · 3 of 117 slices shown]
[im 30/117  soft-tissue]
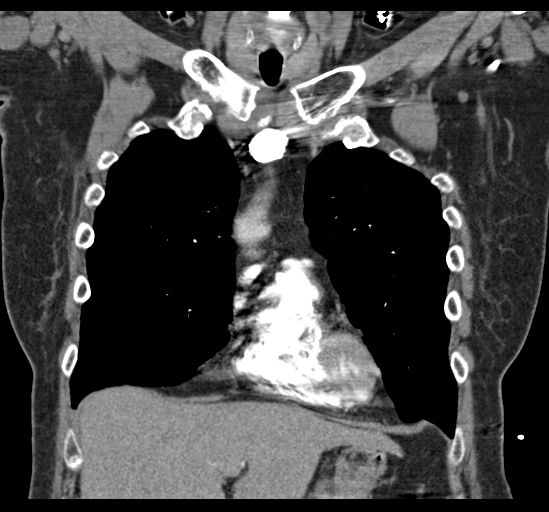
[im 59/117  soft-tissue]
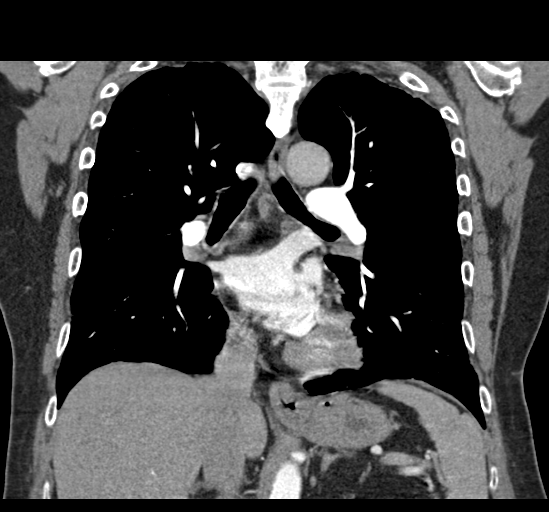
[im 88/117  soft-tissue]
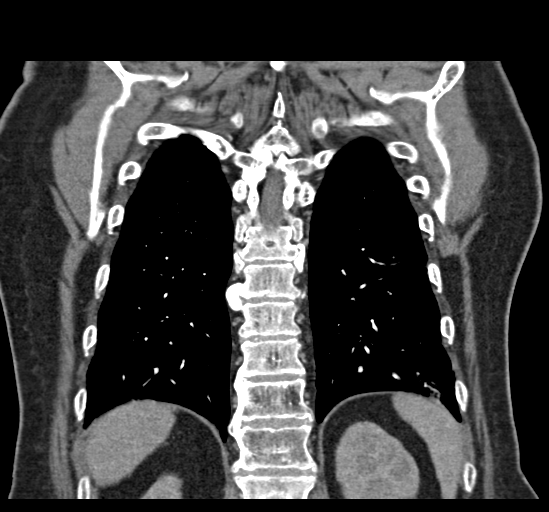

[18 of 46 positions shown; findings below may reference images not displayed]

FINDINGS: Cardiovascular:  There is no evidence of pulmonary embolus.

The heart is normal in size. The thoracic aorta is grossly
unremarkable. Mild mural thrombus is noted along the proximal left
subclavian artery.

Mediastinum/Nodes: No mediastinal lymphadenopathy is seen. No
pericardial effusion is identified. The thyroid gland is
unremarkable. No axillary lymphadenopathy is seen.

Lungs/Pleura: Patchy bibasilar airspace opacities may reflect
pneumonia or inflammatory change, predominantly new from the prior
study. No pleural effusion or pneumothorax is seen.

Upper Abdomen: The visualized portions of the liver and spleen are
unremarkable. The visualized portions of the pancreas, adrenal
glands and kidneys are within normal limits.

Musculoskeletal: No acute osseous abnormalities are identified.
There is chronic loss of height at vertebral body L1. The visualized
musculature is unremarkable in appearance.

Review of the MIP images confirms the above findings.
IMPRESSION: 1. No evidence of pulmonary embolus.
2. Patchy bibasilar airspace opacities may reflect pneumonia or
inflammatory change, predominantly new from the prior study.
3. Mild mural thrombus along the proximal left subclavian artery,
with minimal luminal narrowing.

## 2017-08-23 MED ORDER — ALBUTEROL SULFATE (2.5 MG/3ML) 0.083% IN NEBU
3.0000 mL | INHALATION_SOLUTION | Freq: Four times a day (QID) | RESPIRATORY_TRACT | Status: DC | PRN
  Administered 2017-08-23 – 2017-08-24 (×2): 3 mL via RESPIRATORY_TRACT
  Filled 2017-08-23 (×2): qty 3

## 2017-08-23 MED ORDER — GUAIFENESIN ER 600 MG PO TB12: 600.0000 mg | ORAL_TABLET | Freq: Two times a day (BID) | ORAL | Status: DC | PRN

## 2017-08-23 MED ORDER — ENOXAPARIN SODIUM 40 MG/0.4ML ~~LOC~~ SOLN
40.0000 mg | SUBCUTANEOUS | Status: DC
  Administered 2017-08-23 – 2017-08-25 (×3): 40 mg via SUBCUTANEOUS
  Filled 2017-08-23 (×3): qty 0.4

## 2017-08-23 MED ORDER — MOMETASONE FURO-FORMOTEROL FUM 100-5 MCG/ACT IN AERO
2.0000 | INHALATION_SPRAY | Freq: Two times a day (BID) | RESPIRATORY_TRACT | Status: DC
  Administered 2017-08-23 – 2017-08-25 (×4): 2 via RESPIRATORY_TRACT
  Filled 2017-08-23: qty 8.8

## 2017-08-23 MED ORDER — IPRATROPIUM-ALBUTEROL 0.5-2.5 (3) MG/3ML IN SOLN
3.0000 mL | Freq: Four times a day (QID) | RESPIRATORY_TRACT | Status: DC | PRN
  Administered 2017-08-23: 3 mL via RESPIRATORY_TRACT
  Filled 2017-08-23 (×2): qty 3

## 2017-08-23 MED ORDER — IOPAMIDOL (ISOVUE-370) INJECTION 76%
100.0000 mL | Freq: Once | INTRAVENOUS | Status: AC | PRN
  Administered 2017-08-23: 100 mL via INTRAVENOUS

## 2017-08-23 MED ORDER — PREDNISONE 20 MG PO TABS
40.0000 mg | ORAL_TABLET | Freq: Every day | ORAL | Status: DC
  Administered 2017-08-23 – 2017-08-25 (×3): 40 mg via ORAL
  Filled 2017-08-23 (×3): qty 2

## 2017-08-23 MED ORDER — ALPRAZOLAM 0.5 MG PO TABS
0.5000 mg | ORAL_TABLET | Freq: Two times a day (BID) | ORAL | Status: DC | PRN
  Administered 2017-08-23 – 2017-08-24 (×3): 0.5 mg via ORAL
  Filled 2017-08-23 (×3): qty 1

## 2017-08-23 MED ORDER — DEXTROSE 5 % IV SOLN
1.0000 g | INTRAVENOUS | Status: DC
  Administered 2017-08-24 – 2017-08-25 (×2): 1 g via INTRAVENOUS
  Filled 2017-08-23 (×2): qty 10

## 2017-08-23 MED ORDER — SENNOSIDES-DOCUSATE SODIUM 8.6-50 MG PO TABS: 1.0000 | ORAL_TABLET | Freq: Every evening | ORAL | Status: DC | PRN

## 2017-08-23 MED ORDER — VILAZODONE HCL 20 MG PO TABS
40.0000 mg | ORAL_TABLET | Freq: Every day | ORAL | Status: DC
  Administered 2017-08-23 – 2017-08-25 (×3): 40 mg via ORAL
  Filled 2017-08-23 (×5): qty 2

## 2017-08-23 MED ORDER — AZITHROMYCIN 250 MG PO TABS
500.0000 mg | ORAL_TABLET | Freq: Every day | ORAL | Status: DC
  Administered 2017-08-24 – 2017-08-25 (×2): 500 mg via ORAL
  Filled 2017-08-23 (×2): qty 2

## 2017-08-23 MED ORDER — DEXTROSE 5 % IV SOLN
1.0000 g | Freq: Once | INTRAVENOUS | Status: AC
  Administered 2017-08-23: 1 g via INTRAVENOUS
  Filled 2017-08-23: qty 10

## 2017-08-23 MED ORDER — IOPAMIDOL (ISOVUE-370) INJECTION 76%
INTRAVENOUS | Status: AC
  Filled 2017-08-23: qty 100

## 2017-08-23 MED ORDER — ZOLPIDEM TARTRATE 5 MG PO TABS
5.0000 mg | ORAL_TABLET | Freq: Every evening | ORAL | Status: DC | PRN
  Administered 2017-08-24: 5 mg via ORAL
  Filled 2017-08-23: qty 1

## 2017-08-23 MED ORDER — BENZTROPINE MESYLATE 1 MG PO TABS
1.0000 mg | ORAL_TABLET | Freq: Every day | ORAL | Status: DC
  Administered 2017-08-23 – 2017-08-24 (×2): 1 mg via ORAL
  Filled 2017-08-23 (×2): qty 1

## 2017-08-23 MED ORDER — LURASIDONE HCL 60 MG PO TABS
1.0000 | ORAL_TABLET | Freq: Every day | ORAL | Status: DC
  Administered 2017-08-23 – 2017-08-24 (×2): 60 mg via ORAL
  Filled 2017-08-23 (×2): qty 1

## 2017-08-23 MED ORDER — DEXTROSE 5 % IV SOLN
500.0000 mg | Freq: Once | INTRAVENOUS | Status: AC
  Administered 2017-08-23: 500 mg via INTRAVENOUS
  Filled 2017-08-23: qty 500

## 2017-08-23 NOTE — H&P (Addendum)
History and Physical    Judy [name] KGM:[id number] DOB: 01/23/1951 DOA: 08/22/2017  PCP: Doyle AskewHowell, Jennifer W, PA  Patient coming from: Home  I have personally briefly [facility]  Chief Complaint: SOB  HPI: Judy [name] is a 66 y.o. female with medical history significant of COPD.  Recently treated for COPD exacerbation.  Has been taking prednisone taper as well as doxycycline for past week.  Completed course yesterday.  Symptoms acutely got worse today and she presents to the ED.   ED Course: CTA chest neg, but does show bibasilar infiltrate suggestive of PNA.  Getting admitted as CAP failed outpatient therapy.   Review of Systems: As per HPI otherwise 10 point review of systems negative.   Past Medical History:  Diagnosis Date  . Anxiety   . Bipolar 1 disorder (HCC)   . COPD (chronic obstructive pulmonary disease) (HCC)   . PTSD (post-traumatic stress disorder)     Past Surgical History:  Procedure Laterality Date  . APPENDECTOMY    . bone spurs foot    . HAND SURGERY Left      reports that she has been smoking cigarettes.  She has a 21.50 pack-year smoking history. she has never used smokeless tobacco. She reports that she drinks alcohol. She reports that she does not use drugs.  Allergies  Allergen Reactions  . Celexa [Citalopram Hydrobromide]     "goes weird"    Family History  Problem Relation Age of Onset  . CAD Mother 5480  . CAD Father      Prior to Admission medications   Medication Sig Start Date End Date Taking? Authorizing Provider  albuterol (PROVENTIL HFA;VENTOLIN HFA) 108 (90 Base) MCG/ACT inhaler Inhale 2 puffs into the lungs every 6 (six) hours as needed for wheezing or shortness of breath.   Yes [provider]  benztropine (COGENTIN) 1 MG tablet Take 1 mg at bedtime by mouth. 05/31/17  Yes [provider]  Eszopiclone (ESZOPICLONE) 3 MG TABS Take 3 mg by mouth at bedtime.  Take immediately before bedtime   Yes [provider]  guaiFENesin (MUCINEX) 600 MG 12 hr tablet Take 1 tablet (600 mg total) by mouth 2 (two) times daily. Patient taking differently: Take 600 mg 2 (two) times daily as needed by mouth for cough.  04/22/17  Yes Albertine GratesXu, Fang, MD  ipratropium-albuterol (DUONEB) 0.5-2.5 (3) MG/3ML SOLN Take 3 mLs by nebulization every 6 (six) hours as needed. Patient taking differently: Take 3 mLs every 6 (six) hours as needed by nebulization (sob and wheezing).  04/22/17  Yes Albertine GratesXu, Fang, MD  Lurasidone HCl (LATUDA) 60 MG TABS Take 1 tablet at bedtime by mouth.   Yes [provider]  Vilazodone HCl (VIIBRYD) 40 MG TABS Take 40 mg by mouth daily.    Yes [provider]  ALPRAZolam Prudy Feeler(XANAX) 0.5 MG tablet Take 0.5 mg by mouth 2 (two) times daily as needed for anxiety.     [provider]  senna-docusate (SENOKOT-S) 8.6-50 MG tablet Take 1 tablet by mouth at bedtime. Patient taking differently: Take 1 tablet at bedtime as needed by mouth for mild constipation.  04/22/17   Albertine GratesXu, Fang, MD    Physical Exam: Vitals:   08/22/17 2243 08/23/17 0000 08/23/17 0030 08/23/17 0238  BP: (!) 165/82 121/74 133/66 115/66  Pulse: (!) 42 (!) 43 76 82  Resp: 18 14 19 16   Temp:      TempSrc:  SpO2: 99% 92% 95% 94%    Constitutional: NAD, calm, comfortable Eyes: PERRL, lids and conjunctivae normal ENMT: Mucous membranes are moist. Posterior pharynx clear of any exudate or lesions.Normal dentition.  Neck: normal, supple, no masses, no thyromegaly Respiratory: Rales RLL Cardiovascular: Regular rate and rhythm, no murmurs / rubs / gallops. No extremity edema. 2+ pedal pulses. No carotid bruits.  Abdomen: no tenderness, no masses palpated. No hepatosplenomegaly. Bowel sounds positive.  Musculoskeletal: no clubbing / cyanosis. No joint deformity upper and lower extremities. Good ROM, no contractures. Normal muscle tone.  Skin: no rashes, lesions, ulcers. No  induration Neurologic: CN 2-12 grossly intact. Sensation intact, DTR normal. Strength 5/5 in all 4.  Psychiatric: Normal judgment and insight. Alert and oriented x 3. Normal mood.    Labs on Admission: I have personally reviewed following labs and imaging studies  CBC: Recent Labs  Lab 08/17/17 1934 08/22/17 2328  WBC 21.1* 10.7*  NEUTROABS 18.2* 9.8*  HGB 14.5 13.4  HCT 42.7 38.7  MCV 89.3 84.5  PLT 345 261   Basic Metabolic Panel: Recent Labs  Lab 08/17/17 1934 08/22/17 2328  NA 140 139  K 3.6 3.3*  CL 105 108  CO2 17* 17*  GLUCOSE 113* 140*  BUN 13 12  CREATININE 1.03* 0.94  CALCIUM 10.0 9.4   GFR: CrCl cannot be calculated (Unknown ideal weight.). Liver Function Tests: Recent Labs  Lab 08/17/17 1934  AST 25  ALT 20  ALKPHOS 141*  BILITOT 0.9  PROT 7.7  ALBUMIN 3.7   No results for input(s): LIPASE, AMYLASE in the last 168 hours. No results for input(s): AMMONIA in the last 168 hours. Coagulation Profile: No results for input(s): INR, PROTIME in the last 168 hours. Cardiac Enzymes: No results for input(s): CKTOTAL, CKMB, CKMBINDEX, TROPONINI in the last 168 hours. BNP (last 3 results) No results for input(s): PROBNP in the last 8760 hours. HbA1C: No results for input(s): HGBA1C in the last 72 hours. CBG: No results for input(s): GLUCAP in the last 168 hours. Lipid Profile: No results for input(s): CHOL, HDL, LDLCALC, TRIG, CHOLHDL, LDLDIRECT in the last 72 hours. Thyroid Function Tests: No results for input(s): TSH, T4TOTAL, FREET4, T3FREE, THYROIDAB in the last 72 hours. Anemia Panel: No results for input(s): VITAMINB12, FOLATE, FERRITIN, TIBC, IRON, RETICCTPCT in the last 72 hours. Urine analysis:    Component Value Date/Time   COLORURINE STRAW (A) 04/16/2017 2205   APPEARANCEUR CLEAR 04/16/2017 2205   LABSPEC 1.018 04/16/2017 2205   PHURINE 8.0 04/16/2017 2205   GLUCOSEU NEGATIVE 04/16/2017 2205   HGBUR LARGE (A) 04/16/2017 2205    BILIRUBINUR NEGATIVE 04/16/2017 2205   KETONESUR NEGATIVE 04/16/2017 2205   PROTEINUR NEGATIVE 04/16/2017 2205   NITRITE NEGATIVE 04/16/2017 2205   LEUKOCYTESUR NEGATIVE 04/16/2017 2205    Radiological Exams on Admission: Dg Chest 2 View  Result Date: 08/22/2017 CLINICAL DATA:  66 year old female with shortness of breath. Smoker. EXAM: CHEST  2 VIEW COMPARISON:  Chest radiograph dated 08/17/2017 FINDINGS: Mild diffuse interstitial coarsening and bronchiectatic changes. There are bibasilar linear atelectasis/ scarring. There is no focal consolidation, pleural effusion, or pneumothorax. The cardiac silhouette is within normal limits. There is osteopenia with degenerative changes of the spine. The patient of the humeral heads consistent with chronic rotator cuff injury. Bilateral old healed posterior rib fractures. No acute osseous pathology. IMPRESSION: No active cardiopulmonary disease. Electronically Signed   By: Elgie CollardArash  Radparvar M.D.   On: 08/22/2017 23:22   Ct Angio Chest Pe W  And/or Wo Contrast  Result Date: 08/23/2017 CLINICAL DATA:  Acute onset of shortness of breath, tachypnea and mild expiratory wheezing. Difficulty swallowing. EXAM: CT ANGIOGRAPHY CHEST WITH CONTRAST TECHNIQUE: Multidetector CT imaging of the chest was performed using the standard protocol during bolus administration of intravenous contrast. Multiplanar CT image reconstructions and MIPs were obtained to evaluate the vascular anatomy. CONTRAST:  ISOVUE-370 IOPAMIDOL (ISOVUE-370) INJECTION 76% COMPARISON:  Chest radiograph performed 08/22/2017, and CTA of the chest performed 04/16/2017 FINDINGS: Cardiovascular:  There is no evidence of pulmonary embolus. The heart is normal in size. The thoracic aorta is grossly unremarkable. 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 Mediastinum/Nodes: No mediastinal lymphadenopathy is seen. No pericardial effusion is identified. The thyroid gland is  unremarkable. No axillary lymphadenopathy is seen. Lungs/Pleura: Patchy bibasilar airspace opacities may reflect pneumonia or inflammatory change, predominantly new from the prior study. No pleural effusion or pneumothorax is seen. Upper Abdomen: The visualized portions of the liver and spleen are unremarkable. The visualized portions of the pancreas, adrenal glands and kidneys are within normal limits. Musculoskeletal: No acute osseous abnormalities are identified. There is chronic loss of height at vertebral body L1. The visualized musculature is unremarkable in appearance. Review of the MIP images confirms the above findings. IMPRESSION: 1. No evidence of pulmonary embolus. 2. Patchy bibasilar airspace opacities may reflect pneumonia or inflammatory change, predominantly new from the prior study. 3. 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 Electronically Signed   By: Roanna Raider M.D.   On: 08/23/2017 01:04    EKG: Independently reviewed.  Assessment/Plan Principal Problem:   CAP (community acquired pneumonia) Active Problems:   COPD with acute exacerbation (HCC)    1. CAP - failed outpatient therapy 1. PNA pathway 2. Rocephin / azithromycin 3. Cultures pending 4. Based on how patient looks, im not so sure that venous blood gas with pH of 7.62 is real / acurate.  (patient is not hyperventilating, and bicarb is 17) 1. Will also order a UA since bicarb is 17 for some reason, normal anion gap, RTA? 2. Keep an eye on BMP while inpatient 2. COPD - 1. Prednisone 2. PRN neb treatments per adult wheeze protocol 3. Continue home nebs  DVT prophylaxis: Lovenox Code Status: Full Family Communication: No family n room Disposition Plan: Home after admit Consults called: None Admission status: Admit to inpatient - inpatient status due to failed outpatient treatment of CAP   Hillary Bow. DO [facility] Pager [phone]  If  7AM-7PM, please contact day team taking care of patient [email or web address] Password [email or web address]  08/23/2017, 3:18 AM

## 2017-08-23 NOTE — ED Provider Notes (Signed)
Patient with shortness of breath.  Recently treated for COPD exacerbation.  Has been taking prednisone as well as doxycycline.  She completed the course.  She states that her symptoms acutely worsen today.  Patient signed out to me pending d-dimer.  D-dimer is elevated.  Will proceed with CT.   CT PE study negative for PE, but does show new bibasilar opacities.  Patient has some rales in the RLL.  Discussed with Dr. Adela LankFloyd, who agrees with plan for admission for failing outpatient therapy.  2:58 AM Appreciate Dr. Julian ReilGardner for admitting the patient.   Roxy HorsemanBrowning, Azha Constantin, PA-C 08/23/17 0258    Melene PlanFloyd, Dan, DO 08/23/17 [place] [id number]

## 2017-08-23 NOTE — ED Notes (Signed)
Unable to obtain bld cultures prior to antibiotics. Provider made aware.

## 2017-08-23 NOTE — Progress Notes (Signed)
Patient ID: Judy PlowmanCatherine [name], female   DOB: 08-26-1951, 66 y.o.   MRN: [id number] Patient was admitted early this morning for shortness of breath due to pneumonia and COPD.  Currently on antibiotics and prednisone.  Patient was seen and examined at bedside and plan of care discussed with her.  I reviewed patient's medical records including history and physical myself.  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  This was discussed with vascular surgeon on call Dr.Chen who stated that this probably is a chronic thing and does not need anticoagulation and is at very low risk of embolization.  Repeat labs in a.m.

## 2017-08-24 DIAGNOSIS — J181 Lobar pneumonia, unspecified organism: Secondary | ICD-10-CM

## 2017-08-24 DIAGNOSIS — J441 Chronic obstructive pulmonary disease with (acute) exacerbation: Secondary | ICD-10-CM

## 2017-08-24 DIAGNOSIS — D72829 Elevated white blood cell count, unspecified: Secondary | ICD-10-CM

## 2017-08-24 LAB — BASIC METABOLIC PANEL
Anion gap: 8 (ref 5–15)
BUN: 14 mg/dL (ref 6–20)
CO2: 25 mmol/L (ref 22–32)
Calcium: 9 mg/dL (ref 8.9–10.3)
Chloride: 110 mmol/L (ref 101–111)
Creatinine, Ser: 0.78 mg/dL (ref 0.44–1.00)
GFR calc Af Amer: >60 mL/min (ref >60)
GFR calc non Af Amer: >60 mL/min (ref >60)
Glucose, Bld: 115 mg/dL — ABNORMAL HIGH (ref 65–99)
Potassium: 3.5 mmol/L (ref 3.5–5.1)
Sodium: 143 mmol/L (ref 135–145)

## 2017-08-24 LAB — CBC WITH DIFFERENTIAL/PLATELET
Basophils Absolute: 0 10*3/uL (ref 0.0–0.1)
Basophils Relative: 0 %
Eosinophils Absolute: 0 10*3/uL (ref 0.0–0.7)
Eosinophils Relative: 0 %
HCT: 36 % (ref 36.0–46.0)
Hemoglobin: 11.7 g/dL — ABNORMAL LOW (ref 12.0–15.0)
Lymphocytes Relative: 19 %
Lymphs Abs: 2.5 10*3/uL (ref 0.7–4.0)
MCH: 29.1 pg (ref 26.0–34.0)
MCHC: 32.5 g/dL (ref 30.0–36.0)
MCV: 89.6 fL (ref 78.0–100.0)
Monocytes Absolute: 0.8 10*3/uL (ref 0.1–1.0)
Monocytes Relative: 6 %
Neutro Abs: 9.7 10*3/uL — ABNORMAL HIGH (ref 1.7–7.7)
Neutrophils Relative %: 75 %
Platelets: 258 10*3/uL (ref 150–400)
RBC: 4.02 MIL/uL (ref 3.87–5.11)
RDW: 13.6 % (ref 11.5–15.5)
WBC: 13 10*3/uL — ABNORMAL HIGH (ref 4.0–10.5)

## 2017-08-24 LAB — MAGNESIUM: Magnesium: 2.2 mg/dL (ref 1.7–2.4)

## 2017-08-24 NOTE — Progress Notes (Signed)
Patient ID: Judy [name], female   DOB: 1950-12-26, 66 y.o.   MRN: [id number]  PROGRESS NOTE    Judy PlowmanCatherine [name]  [id number] DOB: 1950-12-26 DOA: 08/22/2017 PCP: Doyle AskewHowell, Jennifer W, PA   Brief Narrative:  66 year old female with history of COPD and recent treatment for COPD exacerbation presented with worsening shortness of breath.  CTA chest was negative for PE but showed bibasilar infiltrate.  She was started on antibiotics and oral prednisone.   Assessment & Plan:   Principal Problem:   CAP (community acquired pneumonia) Active Problems:   COPD with acute exacerbation (HCC)   Community-acquired pneumonia -Continue Rocephin and Zithromax.  Cultures negative so far. -Oxygen supplementation if needed  COPD with exacerbation -Continue oral prednisone along with Dulera and duonebs  Anxiety and depression -Continue current medications.  Outpatient follow-up with primary care provider  Leukocytosis -From pneumonia and COPD.  Repeat a.m. labs   DVT prophylaxis: Lovenox Code Status: Full Family Communication: None at bedside Disposition Plan: Home in 1-2 days  Consultants: None  Procedures: None  Antimicrobials: Rocephin and Zithromax 08/22/2017 onwards   Subjective: Patient seen and examined at bedside.  She still is complaining of intermittent shortness of breath and coughing but slightly better.  No overnight fever, nausea or vomiting.  Objective: Vitals:   08/23/17 2002 08/23/17 2127 08/24/17 0540 08/24/17 0916  BP:  (!) 108/52 129/69   Pulse:  (!) 58 62   Resp:  20 19   Temp:  (!) 97.5 F (36.4 C) 98.6 F (37 C)   TempSrc:  Oral Oral   SpO2: 96% 96% 94%   Weight:    77.1 kg (170 lb)  Height:        Intake/Output Summary (Last 24 hours) at 08/24/2017 1150 Last data filed at 08/24/2017 0500 Gross per 24 hour  Intake 530 ml  Output -  Net 530 ml   Filed Weights   08/24/17 0916  Weight: 77.1 kg (170 lb)    Examination:  General exam:  Appears calm and comfortable  Respiratory system: Bilateral decreased breath sound at bases with some scattered crackles, no wheezing Cardiovascular system: S1 & S2 heard, rate controlled  gastrointestinal system: Abdomen is nondistended, soft and nontender. Normal bowel sounds heard. Extremities: No cyanosis, clubbing, edema    Data Reviewed: I have personally reviewed following labs and imaging studies  CBC: Recent Labs  Lab 08/17/17 1934 08/22/17 2328 08/24/17 0527  WBC 21.1* 10.7* 13.0*  NEUTROABS 18.2* 9.8* 9.7*  HGB 14.5 13.4 11.7*  HCT 42.7 38.7 36.0  MCV 89.3 84.5 89.6  PLT 345 261 258   Basic Metabolic Panel: Recent Labs  Lab 08/17/17 1934 08/22/17 2328 08/24/17 0527  NA 140 139 143  K 3.6 3.3* 3.5  CL 105 108 110  CO2 17* 17* 25  GLUCOSE 113* 140* 115*  BUN 13 12 14   CREATININE 1.03* 0.94 0.78  CALCIUM 10.0 9.4 9.0  MG  --   --  2.2   GFR: Estimated Creatinine Clearance: 75.8 mL/min (by C-G formula based on SCr of 0.78 mg/dL). Liver Function Tests: Recent Labs  Lab 08/17/17 1934  AST 25  ALT 20  ALKPHOS 141*  BILITOT 0.9  PROT 7.7  ALBUMIN 3.7   No results for input(s): LIPASE, AMYLASE in the last 168 hours. No results for input(s): AMMONIA in the last 168 hours. Coagulation Profile: No results for input(s): INR, PROTIME in the last 168 hours. Cardiac Enzymes: No results for input(s): CKTOTAL, CKMB, CKMBINDEX, TROPONINI in  the last 168 hours. BNP (last 3 results) No results for input(s): PROBNP in the last 8760 hours. HbA1C: No results for input(s): HGBA1C in the last 72 hours. CBG: No results for input(s): GLUCAP in the last 168 hours. Lipid Profile: No results for input(s): CHOL, HDL, LDLCALC, TRIG, CHOLHDL, LDLDIRECT in the last 72 hours. Thyroid Function Tests: No results for input(s): TSH, T4TOTAL, FREET4, T3FREE, THYROIDAB in the last 72 hours. Anemia Panel: No results for input(s): VITAMINB12, FOLATE, FERRITIN, TIBC, IRON,  RETICCTPCT in the last 72 hours. Sepsis Labs: No results for input(s): PROCALCITON, LATICACIDVEN in the last 168 hours.  No results found for this or any previous visit (from the past 240 hour(s)).       Radiology Studies: Dg Chest 2 View  Result Date: 08/22/2017 CLINICAL DATA:  66 year old female with shortness of breath. Smoker. EXAM: CHEST  2 VIEW COMPARISON:  Chest radiograph dated 08/17/2017 FINDINGS: Mild diffuse interstitial coarsening and bronchiectatic changes. There are bibasilar linear atelectasis/ scarring. There is no focal consolidation, pleural effusion, or pneumothorax. The cardiac silhouette is within normal limits. There is osteopenia with degenerative changes of the spine. The patient of the humeral heads consistent with chronic rotator cuff injury. Bilateral old healed posterior rib fractures. No acute osseous pathology. IMPRESSION: No active cardiopulmonary disease. Electronically Signed   By: Elgie Collard M.D.   On: 08/22/2017 23:22   Ct Angio Chest Pe W And/or Wo Contrast  Result Date: 08/23/2017 CLINICAL DATA:  Acute onset of shortness of breath, tachypnea and mild expiratory wheezing. Difficulty swallowing. EXAM: CT ANGIOGRAPHY CHEST WITH CONTRAST TECHNIQUE: Multidetector CT imaging of the chest was performed using the standard protocol during bolus administration of intravenous contrast. Multiplanar CT image reconstructions and MIPs were obtained to evaluate the vascular anatomy. CONTRAST:  ISOVUE-370 IOPAMIDOL (ISOVUE-370) INJECTION 76% COMPARISON:  Chest radiograph performed 08/22/2017, and CTA of the chest performed 04/16/2017 FINDINGS: Cardiovascular:  There is no evidence of pulmonary embolus. The heart is normal in size. The thoracic aorta is grossly unremarkable. 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 Mediastinum/Nodes: No mediastinal lymphadenopathy is seen. No pericardial effusion is identified. The thyroid gland is  unremarkable. No axillary lymphadenopathy is seen. Lungs/Pleura: Patchy bibasilar airspace opacities may reflect pneumonia or inflammatory change, predominantly new from the prior study. No pleural effusion or pneumothorax is seen. Upper Abdomen: The visualized portions of the liver and spleen are unremarkable. The visualized portions of the pancreas, adrenal glands and kidneys are within normal limits. Musculoskeletal: No acute osseous abnormalities are identified. There is chronic loss of height at vertebral body L1. The visualized musculature is unremarkable in appearance. Review of the MIP images confirms the above findings. IMPRESSION: 1. No evidence of pulmonary embolus. 2. Patchy bibasilar airspace opacities may reflect pneumonia or inflammatory change, predominantly new from the prior study. 3. 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 Electronically Signed   By: Roanna Raider M.D.   On: 08/23/2017 01:04        Scheduled Meds: . azithromycin  500 mg Oral Daily  . benztropine  1 mg Oral QHS  . enoxaparin (LOVENOX) injection  40 mg Subcutaneous Q24H  . Lurasidone HCl  1 tablet Oral QHS  . mometasone-formoterol  2 puff Inhalation BID  . predniSONE  40 mg Oral Q breakfast  . Vilazodone HCl  40 mg Oral Daily   Continuous Infusions: . cefTRIAXone (ROCEPHIN)  IV Stopped (08/24/17 0500)  LOS: 1 day        Glade LloydKshitiz Amila Callies, MD [facility] Pager [phone]  If 7PM-7AM, please contact night-coverage [email or web address] Password [email or web address] 08/24/2017, 11:50 AM

## 2017-08-25 DIAGNOSIS — Z720 Tobacco use: Secondary | ICD-10-CM

## 2017-08-25 LAB — BASIC METABOLIC PANEL
Anion gap: 7 (ref 5–15)
BUN: 14 mg/dL (ref 6–20)
CO2: 27 mmol/L (ref 22–32)
Calcium: 8.8 mg/dL — ABNORMAL LOW (ref 8.9–10.3)
Chloride: 108 mmol/L (ref 101–111)
Creatinine, Ser: 0.83 mg/dL (ref 0.44–1.00)
GFR calc Af Amer: >60 mL/min (ref >60)
GFR calc non Af Amer: >60 mL/min (ref >60)
Glucose, Bld: 97 mg/dL (ref 65–99)
Potassium: 3.6 mmol/L (ref 3.5–5.1)
Sodium: 142 mmol/L (ref 135–145)

## 2017-08-25 LAB — CBC WITH DIFFERENTIAL/PLATELET
Basophils Absolute: 0 10*3/uL (ref 0.0–0.1)
Basophils Relative: 0 %
Eosinophils Absolute: 0 10*3/uL (ref 0.0–0.7)
Eosinophils Relative: 0 %
HCT: 36.1 % (ref 36.0–46.0)
Hemoglobin: 11.4 g/dL — ABNORMAL LOW (ref 12.0–15.0)
Lymphocytes Relative: 34 %
Lymphs Abs: 3.3 10*3/uL (ref 0.7–4.0)
MCH: 28.4 pg (ref 26.0–34.0)
MCHC: 31.6 g/dL (ref 30.0–36.0)
MCV: 89.8 fL (ref 78.0–100.0)
Monocytes Absolute: 0.4 10*3/uL (ref 0.1–1.0)
Monocytes Relative: 5 %
Neutro Abs: 5.9 10*3/uL (ref 1.7–7.7)
Neutrophils Relative %: 61 %
Platelets: 270 10*3/uL (ref 150–400)
RBC: 4.02 MIL/uL (ref 3.87–5.11)
RDW: 13.5 % (ref 11.5–15.5)
WBC: 9.6 10*3/uL (ref 4.0–10.5)

## 2017-08-25 LAB — BLOOD GAS, VENOUS
O2 Saturation: 98.9 %
Patient temperature: 98.6
pH, Ven: 7.626 (ref 7.250–7.430)
pO2, Ven: 141 mmHg — ABNORMAL HIGH (ref 32.0–45.0)

## 2017-08-25 LAB — MAGNESIUM: Magnesium: 2.3 mg/dL (ref 1.7–2.4)

## 2017-08-25 MED ORDER — LEVOFLOXACIN 750 MG PO TABS: 750.0000 mg | ORAL_TABLET | Freq: Every day | ORAL | 0 refills | Status: AC

## 2017-08-25 MED ORDER — MOMETASONE FURO-FORMOTEROL FUM 100-5 MCG/ACT IN AERO: 2.0000 | INHALATION_SPRAY | Freq: Two times a day (BID) | RESPIRATORY_TRACT | 0 refills | Status: DC

## 2017-08-25 MED ORDER — GUAIFENESIN ER 600 MG PO TB12: 600.0000 mg | ORAL_TABLET | Freq: Two times a day (BID) | ORAL | Status: DC | PRN

## 2017-08-25 MED ORDER — SENNOSIDES-DOCUSATE SODIUM 8.6-50 MG PO TABS: 1.0000 | ORAL_TABLET | Freq: Every evening | ORAL | Status: DC | PRN

## 2017-08-25 MED ORDER — PREDNISONE 20 MG PO TABS: 40.0000 mg | ORAL_TABLET | Freq: Every day | ORAL | 0 refills | Status: DC

## 2017-08-25 NOTE — Discharge Summary (Signed)
Physician Discharge Summary  Judy [name] ZOX:[id number] DOB: 04/01/1951 DOA: 08/22/2017  PCP: Doyle Askew, PA  Admit date: 08/22/2017 Discharge date: 08/25/2017  Admitted From: Home Disposition: Home  Recommendations for Outpatient Follow-up:  1. Follow up with PCP in 1week 2. Follow-up with pulmonary as scheduled  3. Counseled about tobacco cessation    Home Health: No Equipment/Devices: None  Discharge Condition: Stable CODE STATUS: Full Diet recommendation: Heart Healthy   Brief/Interim Summary: 66 year old female with history of COPD and recent treatment for COPD exacerbation presented with worsening shortness of breath.  CTA chest was negative for PE but showed bibasilar infiltrate.  She was started on antibiotics and oral prednisone.  Symptoms improved.  Cultures have been negative.  Afebrile.  Discharge home on oral antibiotics and steroids   Discharge Diagnoses:  Principal Problem:   CAP (community acquired pneumonia) Active Problems:   COPD with acute exacerbation (HCC)   Community-acquired pneumonia -Currently on Rocephin and Zithromax.  Cultures negative so far.  -On room air -Discharge on oral Levaquin for 5 more days  COPD with exacerbation -Continue oral prednisone 40 mg daily for 7 more days. continue dulera and as needed albuterol -Outpatient follow-up with pulmonary  Chronic tobacco abuse -Counseled about tobacco cessation  Anxiety and depression -Continue current medications.  Outpatient follow-up with primary care provider  Leukocytosis -From pneumonia and COPD.    Resolved    Discharge Instructions  Discharge Instructions    Call MD for:  difficulty breathing, headache or visual disturbances   Complete by:  As directed    Call MD for:  extreme fatigue   Complete by:  As directed    Call MD for:  hives   Complete by:  As directed    Call MD for:  persistant dizziness or light-headedness   Complete by:  As directed    Call MD for:  persistant nausea and vomiting   Complete by:  As directed    Call MD for:  severe uncontrolled pain   Complete by:  As directed    Call MD for:  temperature >100.4   Complete by:  As directed    Diet - low sodium heart healthy   Complete by:  As directed    Increase activity slowly   Complete by:  As directed      Allergies as of 08/25/2017      Reactions   Celexa [citalopram Hydrobromide]    "goes weird"      Medication List    TAKE these medications   albuterol 108 (90 Base) MCG/ACT inhaler Commonly known as:  PROVENTIL HFA;VENTOLIN HFA Inhale 2 puffs into the lungs every 6 (six) hours as needed for wheezing or shortness of breath.   ALPRAZolam 0.5 MG tablet Commonly known as:  XANAX Take 0.5 mg by mouth 2 (two) times daily as needed for anxiety.   benztropine 1 MG tablet Commonly known as:  COGENTIN Take 1 mg at bedtime by mouth.   eszopiclone 3 MG Tabs Generic drug:  Eszopiclone Take 3 mg by mouth at bedtime. Take immediately before bedtime   guaiFENesin 600 MG 12 hr tablet Commonly known as:  MUCINEX Take 1 tablet (600 mg total) 2 (two) times daily as needed by mouth for cough.   ipratropium-albuterol 0.5-2.5 (3) MG/3ML Soln Commonly known as:  DUONEB Take 3 mLs by nebulization every 6 (six) hours as needed. What changed:  reasons to take this   LATUDA 60 MG Tabs Generic drug:  Lurasidone HCl Take  1 tablet at bedtime by mouth.   levofloxacin 750 MG tablet Commonly known as:  LEVAQUIN Take 1 tablet (750 mg total) daily for 5 days by mouth.   mometasone-formoterol 100-5 MCG/ACT Aero Commonly known as:  DULERA Inhale 2 puffs 2 (two) times daily into the lungs.   predniSONE 20 MG tablet Commonly known as:  DELTASONE Take 2 tablets (40 mg total) daily with breakfast by mouth. 40mg  daily for 7 days   senna-docusate 8.6-50 MG tablet Commonly known as:  Senokot-S Take 1 tablet at bedtime as needed by mouth for mild constipation.   VIIBRYD  40 MG Tabs Generic drug:  Vilazodone HCl Take 40 mg by mouth daily.      Follow-up Information    Doyle Askew, [place]. Schedule an appointment as soon as possible for a visit in 1 week(s).   Contact information: [address] [place] [address] [phone]          Allergies  Allergen Reactions  . Celexa [Citalopram Hydrobromide]     "goes weird"    Consultations:  None   Procedures/Studies: Dg Chest 2 View  Result Date: 08/22/2017 CLINICAL DATA:  66 year old female with shortness of breath. Smoker. EXAM: CHEST  2 VIEW COMPARISON:  Chest radiograph dated 08/17/2017 FINDINGS: Mild diffuse interstitial coarsening and bronchiectatic changes. There are bibasilar linear atelectasis/ scarring. There is no focal consolidation, pleural effusion, or pneumothorax. The cardiac silhouette is within normal limits. There is osteopenia with degenerative changes of the spine. The patient of the humeral heads consistent with chronic rotator cuff injury. Bilateral old healed posterior rib fractures. No acute osseous pathology. IMPRESSION: No active cardiopulmonary disease. Electronically Signed   By: Elgie Collard M.D.   On: 08/22/2017 23:22   Dg Chest 2 View  Result Date: 08/17/2017 CLINICAL DATA:  66 year old female with wheezing. EXAM: CHEST  2 VIEW COMPARISON:  Chest radiograph dated 04/23/2017 FINDINGS: There is mild diffuse chronic interstitial coarsening and bronchiectatic changes. There is no focal consolidation, pleural effusion, or pneumothorax. The cardiac silhouette is within normal limits. There is osteopenia with degenerative changes of the spine. Lower thoracic compression deformity appears similar to the prior radiograph of 04/23/2017. Multiple bilateral old healed rib fractures noted. No acute osseous pathology. IMPRESSION: No active cardiopulmonary disease. Electronically Signed   By: Elgie Collard M.D.   On: 08/17/2017 19:37   Ct Angio Chest Pe W And/or Wo  Contrast  Result Date: 08/23/2017 CLINICAL DATA:  Acute onset of shortness of breath, tachypnea and mild expiratory wheezing. Difficulty swallowing. EXAM: CT ANGIOGRAPHY CHEST WITH CONTRAST TECHNIQUE: Multidetector CT imaging of the chest was performed using the standard protocol during bolus administration of intravenous contrast. Multiplanar CT image reconstructions and MIPs were obtained to evaluate the vascular anatomy. CONTRAST:  ISOVUE-370 IOPAMIDOL (ISOVUE-370) INJECTION 76% COMPARISON:  Chest radiograph performed 08/22/2017, and CTA of the chest performed 04/16/2017 FINDINGS: Cardiovascular:  There is no evidence of pulmonary embolus. The heart is normal in size. The thoracic aorta is grossly unremarkable. 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 Mediastinum/Nodes: No mediastinal lymphadenopathy is seen. No pericardial effusion is identified. The thyroid gland is unremarkable. No axillary lymphadenopathy is seen. Lungs/Pleura: Patchy bibasilar airspace opacities may reflect pneumonia or inflammatory change, predominantly new from the prior study. No pleural effusion or pneumothorax is seen. Upper Abdomen: The visualized portions of the liver and spleen are unremarkable. The visualized portions of the pancreas, adrenal glands and kidneys are within normal limits. Musculoskeletal: No  acute osseous abnormalities are identified. There is chronic loss of height at vertebral body L1. The visualized musculature is unremarkable in appearance. Review of the MIP images confirms the above findings. IMPRESSION: 1. No evidence of pulmonary embolus. 2. Patchy bibasilar airspace opacities may reflect pneumonia or inflammatory change, predominantly new from the prior study. 3. 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 Electronically Signed   By: Roanna RaiderJeffery  Chang M.D.   On: 08/23/2017 01:04    Subjective: Patient seen and examined  at bedside.  She feels much better and wants to go home.  No overnight fever, nausea or vomiting.  Discharge Exam: Vitals:   08/25/17 0526 08/25/17 0759  BP:    Pulse: (!) 44   Resp: 18   Temp: 98 F (36.7 C)   SpO2:  96%   Vitals:   08/24/17 2120 08/25/17 0500 08/25/17 0526 08/25/17 0759  BP: (!) 120/56 (!) 145/71    Pulse: (!) 53  (!) 44   Resp: 20  18   Temp: 98.6 F (37 C)  98 F (36.7 C)   TempSrc: Oral  Oral   SpO2: 97% 98%  96%  Weight:      Height:        General: Pt is alert, awake, not in acute distress Cardiovascular: Mild bradycardia, S1/S2 + Respiratory: Decreased breath sounds at bases with scant wheezing abdominal: Soft, NT, ND, bowel sounds + Extremities: no edema, no cyanosis    The results of significant diagnostics from this hospitalization (including imaging, microbiology, ancillary and laboratory) are listed below for reference.     Microbiology: No results found for this or any previous visit (from the past 240 hour(s)).   Labs: BNP (last 3 results) Recent Labs    08/22/17 2328  BNP 62.9   Basic Metabolic Panel: Recent Labs  Lab 08/22/17 2328 08/24/17 0527 08/25/17 0503  NA 139 143 142  K 3.3* 3.5 3.6  CL 108 110 108  CO2 17* 25 27  GLUCOSE 140* 115* 97  BUN 12 14 14   CREATININE 0.94 0.78 0.83  CALCIUM 9.4 9.0 8.8*  MG  --  2.2 2.3   Liver Function Tests: No results for input(s): AST, ALT, ALKPHOS, BILITOT, PROT, ALBUMIN in the last 168 hours. No results for input(s): LIPASE, AMYLASE in the last 168 hours. No results for input(s): AMMONIA in the last 168 hours. CBC: Recent Labs  Lab 08/22/17 2328 08/24/17 0527 08/25/17 0503  WBC 10.7* 13.0* 9.6  NEUTROABS 9.8* 9.7* 5.9  HGB 13.4 11.7* 11.4*  HCT 38.7 36.0 36.1  MCV 84.5 89.6 89.8  PLT 261 258 270   Cardiac Enzymes: No results for input(s): CKTOTAL, CKMB, CKMBINDEX, TROPONINI in the last 168 hours. BNP: Invalid input(s): POCBNP CBG: No results for input(s):  GLUCAP in the last 168 hours. D-Dimer Recent Labs    08/22/17 2328  DDIMER 1.34*   Hgb A1c No results for input(s): HGBA1C in the last 72 hours. Lipid Profile No results for input(s): CHOL, HDL, LDLCALC, TRIG, CHOLHDL, LDLDIRECT in the last 72 hours. Thyroid function studies No results for input(s): TSH, T4TOTAL, T3FREE, THYROIDAB in the last 72 hours.  Invalid input(s): FREET3 Anemia work up No results for input(s): VITAMINB12, FOLATE, FERRITIN, TIBC, IRON, RETICCTPCT in the last 72 hours. Urinalysis    Component Value Date/Time   COLORURINE YELLOW 08/23/2017 0325   APPEARANCEUR CLEAR 08/23/2017 0325   LABSPEC >1.046 (H) 08/23/2017 0325   PHURINE 5.0 08/23/2017  0325   GLUCOSEU NEGATIVE 08/23/2017 0325   HGBUR SMALL (A) 08/23/2017 0325   BILIRUBINUR NEGATIVE 08/23/2017 0325   KETONESUR NEGATIVE 08/23/2017 0325   PROTEINUR NEGATIVE 08/23/2017 0325   NITRITE NEGATIVE 08/23/2017 0325   LEUKOCYTESUR NEGATIVE 08/23/2017 0325   Sepsis Labs Invalid input(s): PROCALCITONIN,  WBC,  LACTICIDVEN Microbiology No results found for this or any previous visit (from the past 240 hour(s)).   Time coordinating discharge: 35 minutes  SIGNED:   Glade LloydKshitiz Marleen Moret, MD  [facility] 08/25/2017, 8:40 AM Pager: [phone]  If 7PM-7AM, please contact night-coverage [email or web address] Password [email or web address]

## 2017-08-25 NOTE — Progress Notes (Signed)
Date: August 25, 2017 Chart review for discharge needs:  None found for case management. Patient has no questions concerning post hospital care. 

## 2017-08-25 NOTE — Progress Notes (Signed)
Patient being discharged home today. Discharge instructions were gone over with patient. Patient aware of prescriptions sent to desired pharmacy. IV was removed. Awaiting daughter arrival for transport to home.

## 2017-09-09 ENCOUNTER — Ambulatory Visit: Payer: No Typology Code available for payment source | Admitting: Emergency Medicine

## 2018-02-11 ENCOUNTER — Emergency Department (HOSPITAL_BASED_OUTPATIENT_CLINIC_OR_DEPARTMENT_OTHER)
Admission: EM | Admit: 2018-02-11 | Discharge: 2018-02-11 | Disposition: A | Discharge status: Home / Self Care | Payer: Medicare Other | Attending: Emergency Medicine | Admitting: Emergency Medicine

## 2018-02-11 ENCOUNTER — Emergency Department (HOSPITAL_BASED_OUTPATIENT_CLINIC_OR_DEPARTMENT_OTHER): Payer: Medicare Other

## 2018-02-11 ENCOUNTER — Other Ambulatory Visit: Payer: Self-pay

## 2018-02-11 ENCOUNTER — Encounter (HOSPITAL_BASED_OUTPATIENT_CLINIC_OR_DEPARTMENT_OTHER): Payer: Self-pay

## 2018-02-11 DIAGNOSIS — W01190A Fall on same level from slipping, tripping and stumbling with subsequent striking against furniture, initial encounter: Secondary | ICD-10-CM | POA: Insufficient documentation

## 2018-02-11 DIAGNOSIS — Y999 Unspecified external cause status: Secondary | ICD-10-CM | POA: Diagnosis not present

## 2018-02-11 DIAGNOSIS — S0121XA Laceration without foreign body of nose, initial encounter: Secondary | ICD-10-CM | POA: Diagnosis not present

## 2018-02-11 DIAGNOSIS — Y92003 Bedroom of unspecified non-institutional (private) residence as the place of occurrence of the external cause: Secondary | ICD-10-CM | POA: Diagnosis not present

## 2018-02-11 DIAGNOSIS — S0992XA Unspecified injury of nose, initial encounter: Secondary | ICD-10-CM | POA: Diagnosis present

## 2018-02-11 DIAGNOSIS — Y9389 Activity, other specified: Secondary | ICD-10-CM | POA: Diagnosis not present

## 2018-02-11 DIAGNOSIS — S0993XA Unspecified injury of face, initial encounter: Secondary | ICD-10-CM

## 2018-02-11 DIAGNOSIS — S022XXB Fracture of nasal bones, initial encounter for open fracture: Secondary | ICD-10-CM | POA: Diagnosis not present

## 2018-02-11 DIAGNOSIS — F17210 Nicotine dependence, cigarettes, uncomplicated: Secondary | ICD-10-CM | POA: Insufficient documentation

## 2018-02-11 DIAGNOSIS — J449 Chronic obstructive pulmonary disease, unspecified: Secondary | ICD-10-CM | POA: Diagnosis not present

## 2018-02-11 DIAGNOSIS — Z79899 Other long term (current) drug therapy: Secondary | ICD-10-CM | POA: Diagnosis not present

## 2018-02-11 IMAGING — DX DG NASAL BONES 3+V
3 series · 3 of 3 positions shown · non-contrast
Comparison: None.

CLINICAL DATA: Fall with nasal injury.

EXAM:
NASAL BONES - 3+ VIEW

[nasal waters]
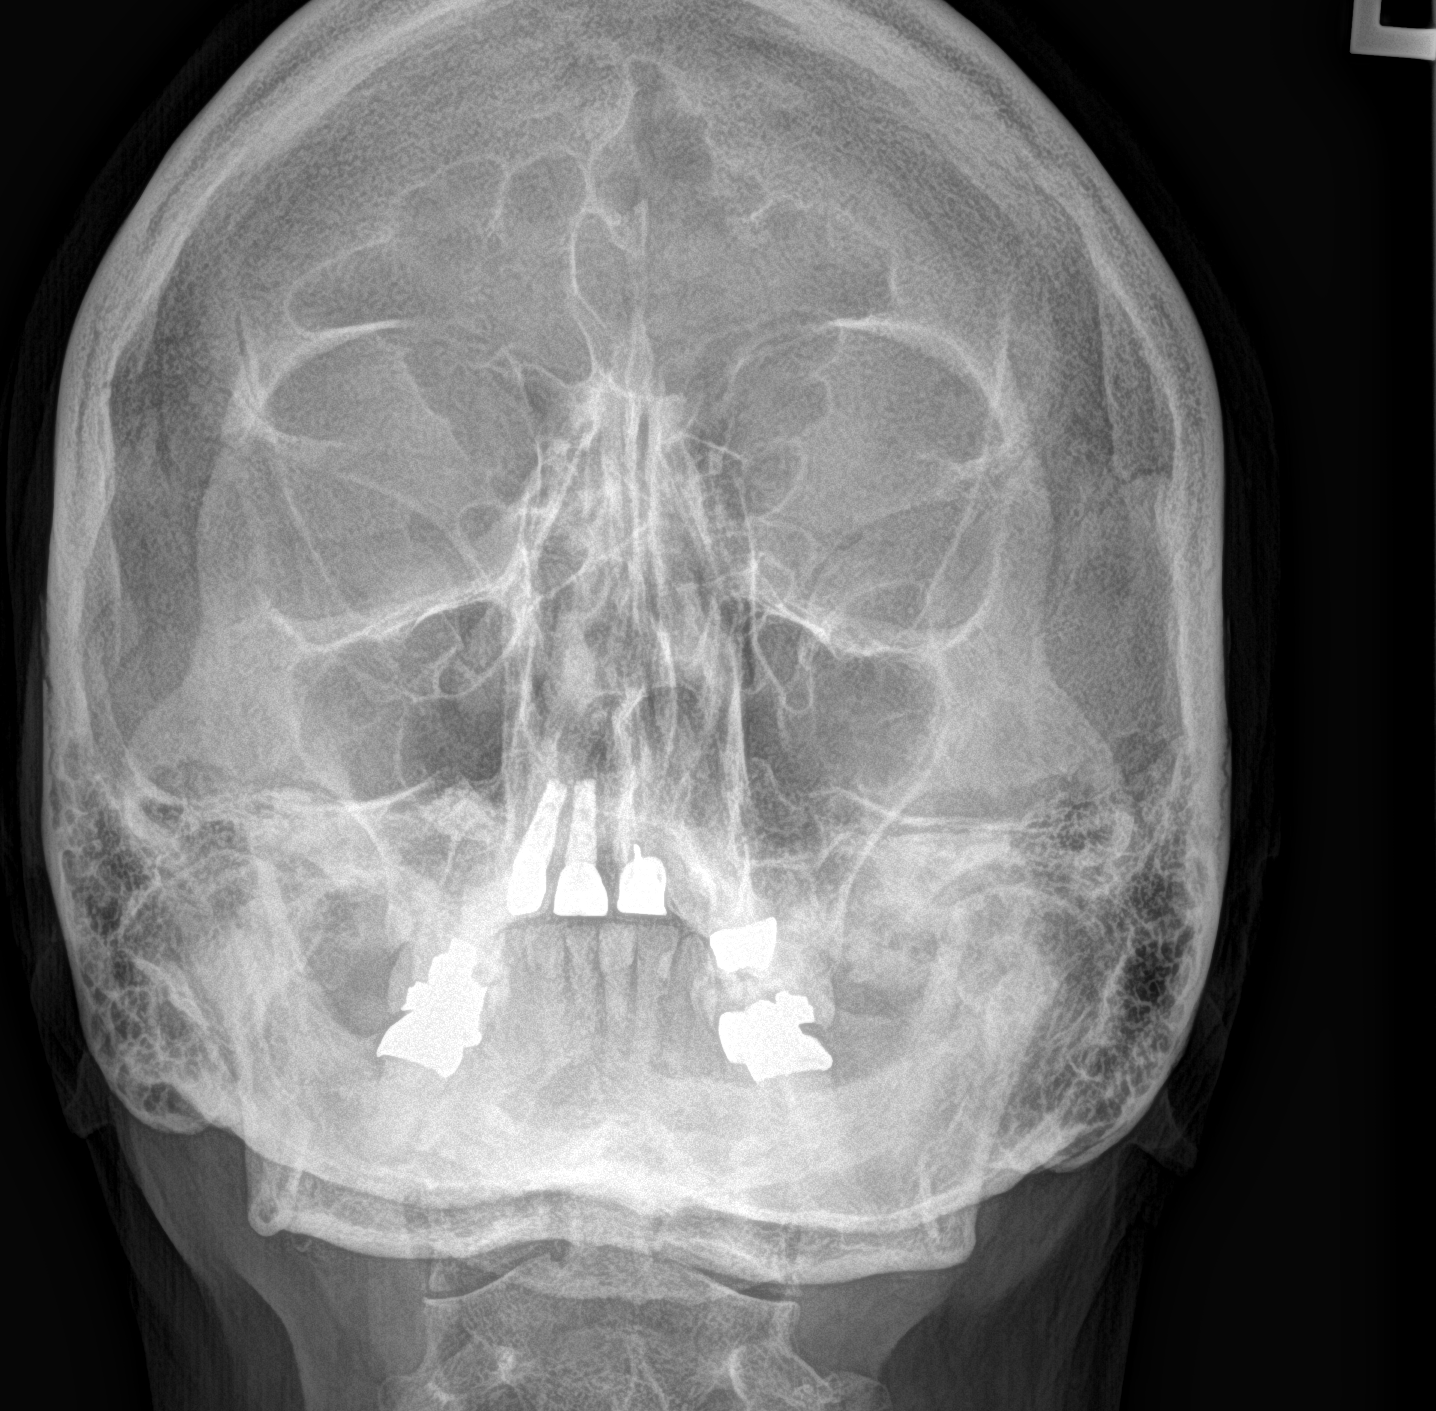

[nasal lat (1 of 2)]
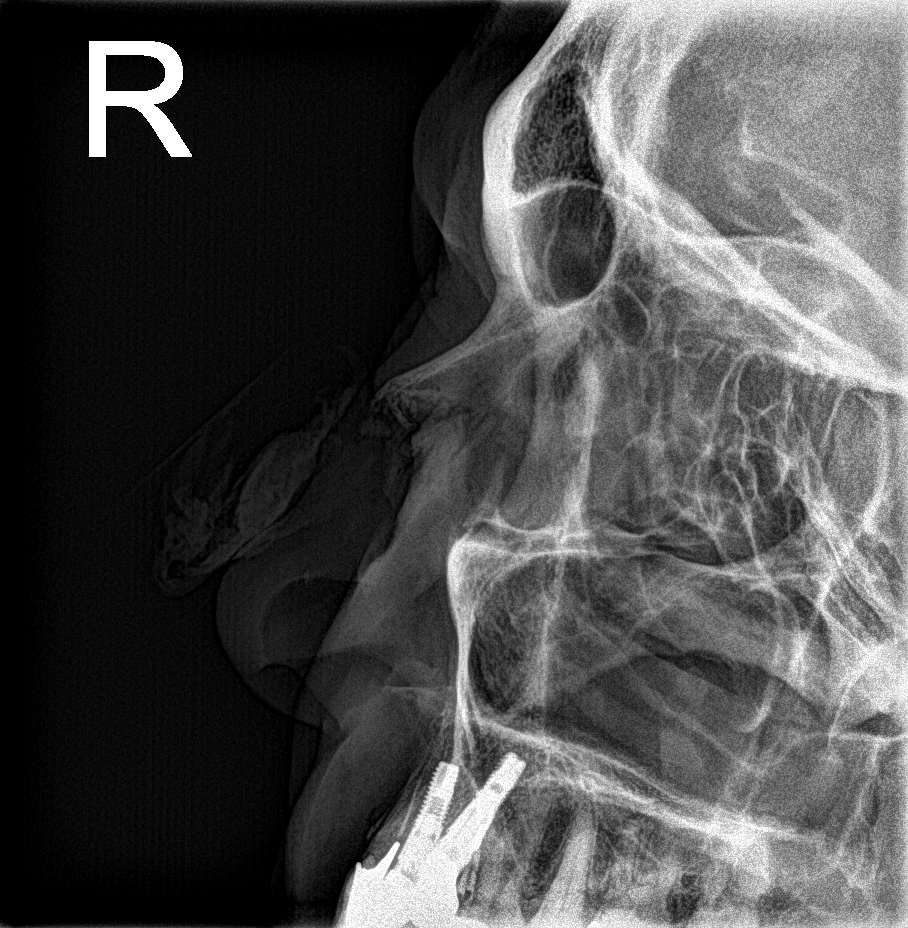

[nasal lat (2 of 2)]
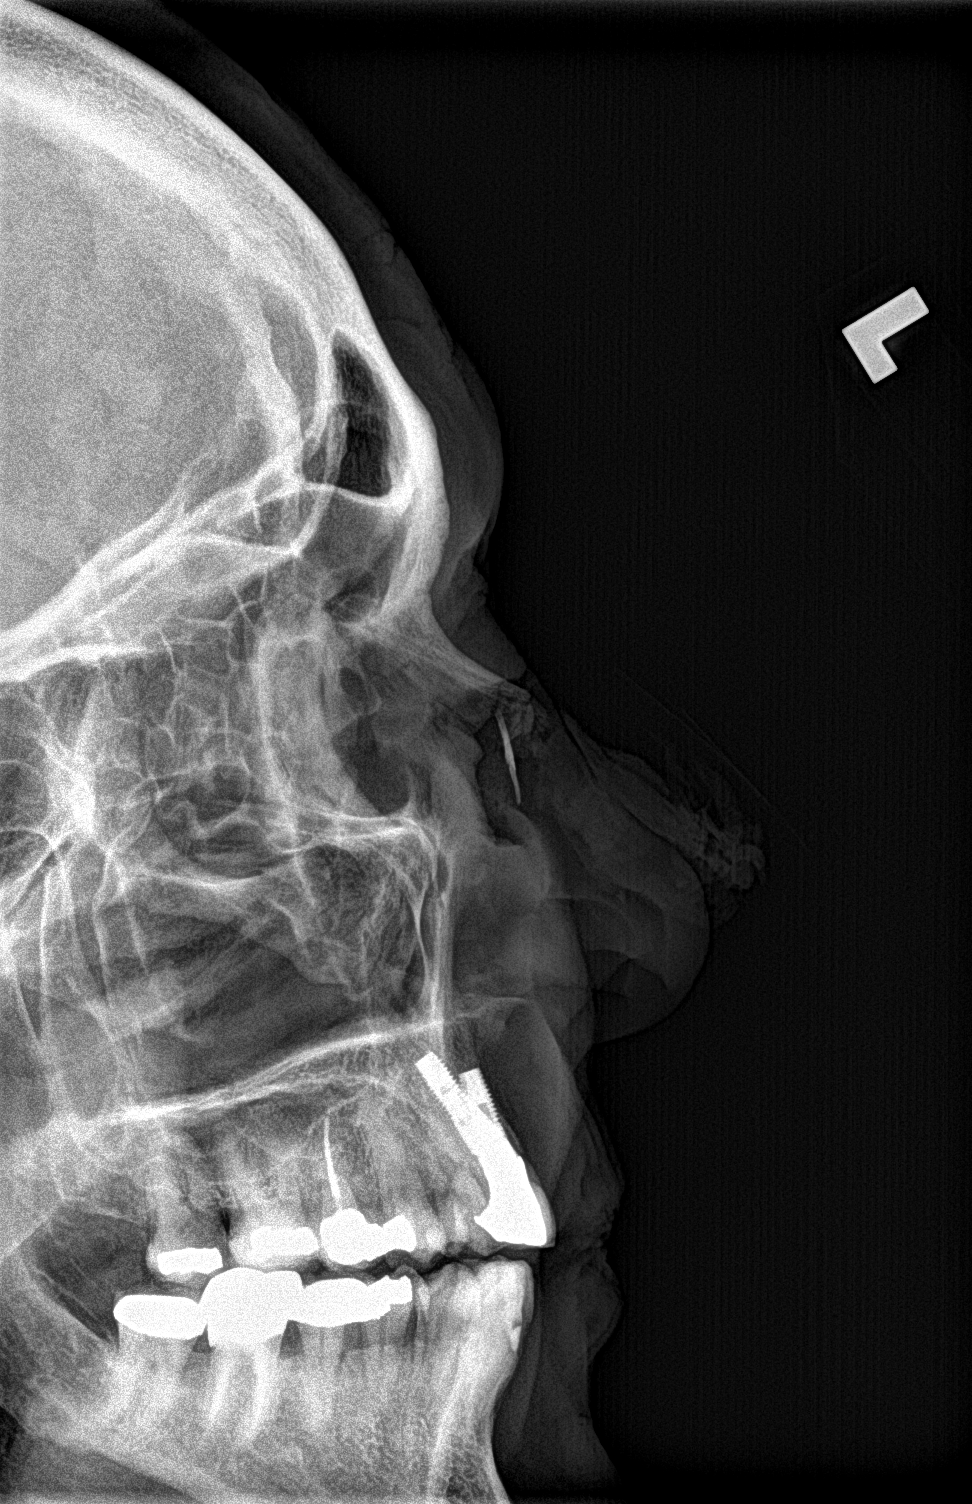

[3 of 3 positions shown; findings below may reference images not displayed]

FINDINGS: Displaced nasal fracture seen best on the left lateral view. The
septum is likely deviated towards the left on the frontal
projection. Visualized paranasal sinuses show normal aeration. No
soft tissue foreign body identified.
IMPRESSION: Displaced nasal fracture with deviated nasal septum.

## 2018-02-11 MED ORDER — CEPHALEXIN 250 MG PO CAPS
500.0000 mg | ORAL_CAPSULE | Freq: Once | ORAL | Status: AC
  Administered 2018-02-11: 500 mg via ORAL
  Filled 2018-02-11: qty 2

## 2018-02-11 MED ORDER — CEPHALEXIN 500 MG PO CAPS: 500.0000 mg | ORAL_CAPSULE | Freq: Three times a day (TID) | ORAL | 0 refills | Status: AC

## 2018-02-11 MED ORDER — IBUPROFEN 400 MG PO TABS
600.0000 mg | ORAL_TABLET | Freq: Once | ORAL | Status: AC
  Administered 2018-02-11: 600 mg via ORAL
  Filled 2018-02-11: qty 1

## 2018-02-11 MED ORDER — LIDOCAINE-EPINEPHRINE-TETRACAINE (LET) SOLUTION
3.0000 mL | Freq: Once | NASAL | Status: AC
  Administered 2018-02-11: 3 mL via TOPICAL
  Filled 2018-02-11: qty 3

## 2018-02-11 NOTE — ED Notes (Signed)
ED Provider at bedside. 

## 2018-02-11 NOTE — ED Notes (Signed)
Nose wound cleaned with saline and gauze. Bleeding controlled; no laceration noted; small abrasion noted.

## 2018-02-11 NOTE — ED Provider Notes (Signed)
[facility] EMERGENCY DEPARTMENT Provider Note   CSN: [id number] Arrival date & time: 02/11/18  1704     History   Chief Complaint Chief Complaint  Patient presents with  . Facial Injury    HPI Judy [name] is a 67 y.o. female with past medical history of anxiety, PTSD, bipolar 1 disorder, COPD presenting with nasal bridge laceration after slipping off the edge of her bed while sitting down attempting to put on a shoe.  She states that she slipped and then hit her face onto her dresser.  No loss of consciousness.  She is not on chronic anticoagulants.  Denies any headache, dizziness, nausea, vomiting, weakness, numbness.  She also reports soreness along the left trapezius muscle, no arthralgia or joint swelling.  HPI  Past Medical History:  Diagnosis Date  . Anxiety   . Bipolar 1 disorder (HCC)   . COPD (chronic obstructive pulmonary disease) (HCC)   . PTSD (post-traumatic stress disorder)     Patient Active Problem List   Diagnosis Date Noted  . CAP (community acquired pneumonia) 08/23/2017  . COPD with acute exacerbation (HCC) 04/14/2017  . Hyperglycemia 04/14/2017  . Acute on chronic respiratory failure with hypoxia (HCC) 04/13/2017    Past Surgical History:  Procedure Laterality Date  . APPENDECTOMY    . bone spurs foot    . HAND SURGERY Left      OB History   None      Home Medications    Prior to Admission medications   Medication Sig Start Date End Date Taking? Authorizing Provider  albuterol (PROVENTIL HFA;VENTOLIN HFA) 108 (90 Base) MCG/ACT inhaler Inhale 2 puffs into the lungs every 6 (six) hours as needed for wheezing or shortness of breath.    [provider]  ALPRAZolam Prudy Feeler) 0.5 MG tablet Take 0.5 mg by mouth 2 (two) times daily as needed for anxiety.     [provider]  benztropine (COGENTIN) 1 MG tablet Take 1 mg at bedtime by mouth. 05/31/17   [provider]  cephALEXin (KEFLEX) 500 MG capsule Take 1  capsule (500 mg total) by mouth 3 (three) times daily for 7 days. 02/11/18 02/18/18  Mathews Robinsons B, PA-C  Eszopiclone (ESZOPICLONE) 3 MG TABS Take 3 mg by mouth at bedtime. Take immediately before bedtime    [provider]  guaiFENesin (MUCINEX) 600 MG 12 hr tablet Take 1 tablet (600 mg total) 2 (two) times daily as needed by mouth for cough. 08/25/17   Glade Lloyd, MD  ipratropium-albuterol (DUONEB) 0.5-2.5 (3) MG/3ML SOLN Take 3 mLs by nebulization every 6 (six) hours as needed. Patient taking differently: Take 3 mLs every 6 (six) hours as needed by nebulization (sob and wheezing).  04/22/17   Albertine Grates, MD  Lurasidone HCl (LATUDA) 60 MG TABS Take 80 mg by mouth at bedtime.     [provider]  mometasone-formoterol (DULERA) 100-5 MCG/ACT AERO Inhale 2 puffs 2 (two) times daily into the lungs. 08/25/17   Glade Lloyd, MD  predniSONE (DELTASONE) 20 MG tablet Take 2 tablets (40 mg total) daily with breakfast by mouth.  daily for 7 days 08/25/17   Glade Lloyd, MD  senna-docusate (SENOKOT-S) 8.6-50 MG tablet Take 1 tablet at bedtime as needed by mouth for mild constipation. 08/25/17   Glade Lloyd, MD  Vilazodone HCl (VIIBRYD) 40 MG TABS Take 40 mg by mouth daily.     [provider]    Family History Family History  Problem Relation Age  of Onset  . CAD Mother 46  . CAD Father     Social History Social History   Tobacco Use  . Smoking status: Current Every Day Smoker    Packs/day: 0.50    Years: 43.00    Pack years: 21.50    Types: Cigarettes  . Smokeless tobacco: Never Used  Substance Use Topics  . Alcohol use: Yes    Comment: occ  . Drug use: No     Allergies   Celexa [citalopram hydrobromide]   Review of Systems Review of Systems  Constitutional: Negative for chills, diaphoresis, fatigue and fever.  HENT: Positive for facial swelling. Negative for congestion, ear pain, sore throat, tinnitus and trouble swallowing.   Eyes:  Negative for photophobia, pain, redness and visual disturbance.  Respiratory: Negative for cough, chest tightness and shortness of breath.   Cardiovascular: Negative for chest pain and leg swelling.  Gastrointestinal: Negative for abdominal pain, nausea and vomiting.  Genitourinary: Negative for dysuria, flank pain and hematuria.  Musculoskeletal: Positive for myalgias. Negative for arthralgias, back pain, gait problem, neck pain and neck stiffness.  Skin: Positive for wound. Negative for color change, pallor and rash.  Neurological: Negative for dizziness, tremors, seizures, syncope, facial asymmetry, speech difficulty, weakness, light-headedness, numbness and headaches.  Psychiatric/Behavioral: Negative for behavioral problems.     Physical Exam Updated Vital Signs BP 106/81 (BP Location: Left Arm)   Pulse 98   Temp 98.2 F (36.8 C) (Oral)   Resp 18   Ht  (1.778 m)   Wt 74.8 kg (165 lb)   SpO2 98%   BMI 23.68 kg/m   Physical Exam  Constitutional: She is oriented to person, place, and time. She appears well-developed and well-nourished. No distress.  Afebrile, nontoxic-appearing, sitting comfortably in chair in no acute distress.  HENT:  Mouth/Throat: Oropharynx is clear and moist. No oropharyngeal exudate.  0.5 cm laceration over the bridge of the nose, ttp of nasal bone, no ttp of periorbital bone, zygomatic or mandibular. No trismus.  Eyes: Pupils are equal, round, and reactive to light. Conjunctivae and EOM are normal. Right eye exhibits no discharge. Left eye exhibits no discharge.  No periorbital edema, full extraocular range of motion without pain.  Neck: Normal range of motion. Neck supple.  No midline tenderness to palpation of the cervical spine.  Patient has slightly decreased range of motion at baseline.  No changes.  Cardiovascular: Normal rate, regular rhythm, normal heart sounds and intact distal pulses.  No murmur heard. Pulmonary/Chest: Effort normal and  breath sounds normal. No stridor. No respiratory distress. She has no wheezes. She has no rales. She exhibits no tenderness.  Abdominal: She exhibits no distension.  Musculoskeletal: Normal range of motion. She exhibits tenderness. She exhibits no edema or deformity.  Tenderness palpation of the left trapezius muscle.  No bony tenderness of the shoulders or clavicles.  No midline tenderness to palpation of the spine  Neurological: She is alert and oriented to person, place, and time. No cranial nerve deficit or sensory deficit. She exhibits normal muscle tone.  5/5 strength in upper and lower extremities bilaterally, normal stance and gait, normal balance, no cranial nerve deficits.  Skin: Skin is warm and dry. No rash noted. She is not diaphoretic. No erythema. No pallor.  Psychiatric: She has a normal mood and affect. Her behavior is normal.  Nursing note and vitals reviewed.    ED Treatments / Results  Labs (all labs ordered are listed, but only abnormal results  are displayed) Labs Reviewed - No data to display  EKG None  Radiology Dg Nasal Bones  Result Date: 02/11/2018 CLINICAL DATA:  Fall with nasal injury. EXAM: NASAL BONES - 3+ VIEW COMPARISON:  None. FINDINGS: Displaced nasal fracture seen best on the left lateral view. The septum is likely deviated towards the left on the frontal projection. Visualized paranasal sinuses show normal aeration. No soft tissue foreign body identified. IMPRESSION: Displaced nasal fracture with deviated nasal septum. Electronically Signed   By: Irish Lack M.D.   On: 02/11/2018 19:59    Procedures Procedures (including critical care time) LACERATION REPAIR Performed by: Georgiana Shore Authorized by: Georgiana Shore Consent: Verbal consent obtained. Risks and benefits: risks, benefits and alternatives were discussed Consent given by: patient Patient identity confirmed: provided demographic data Prepped and Draped in normal sterile  fashion Wound explored  Laceration Location: nasal bridge  Laceration Length: 0.5cm  No Foreign Bodies seen or palpated  Anesthesia: local infiltration  Local anesthetic: L.E.T  Anesthetic total: 3 ml  Irrigation method: syringe Amount of cleaning: standard  Skin closure: 6.0 prolene and steri-strip  Number of sutures: 2  Technique: simple interrupted  Patient tolerance: Patient tolerated the procedure well with no immediate complications.   Medications Ordered in ED Medications  cephALEXin (KEFLEX) capsule 500 mg (has no administration in time range)  ibuprofen (ADVIL,MOTRIN) tablet 600 mg (600 mg Oral Given 02/11/18 1859)  lidocaine-EPINEPHrine-tetracaine (LET) solution (3 mLs Topical Given 02/11/18 1859)     Initial Impression / Assessment and Plan / ED Course  I have reviewed the triage vital signs and the nursing notes.  Pertinent labs & imaging results that were available during my care of the patient were reviewed by me and considered in my medical decision making (see chart for details).     Patient presents with nasal bridge laceration approximately 0.5 cm after slipping on the edge of her bed and hitting her face on her dresser.  No anticoagulant use, no headache, nausea, vomiting, dizziness, blurred vision.  Normal neuro exam.  No septal hematoma.  Radiology with displaced nasal bone fracture and deviated septum.   Irrigation performed. Wound explored and base of wound visualized in a bloodless field without evidence of foreign body.  Laceration occurred < 8 hours prior to repair which was well tolerated.   Discussed suture home care with patient and answered questions. Pt to follow-up for wound check and suture removal in 5 days; they are to return to the ED sooner for signs of infection. Pt is hemodynamically stable with no complaints prior to dc.   Patient was discussed with Dr. Criss Alvine who has also seen patient and agrees with assessment and plan. He does  not recommend consult to ENT at this time.  Will dc home with antibiotics and close follow up with Dr. Jenne Pane ENT in clinic.  Discussed strict return precautions and advised to return to the emergency department if experiencing any new or worsening symptoms. Instructions were understood and patient agreed with discharge plan.  Final Clinical Impressions(s) / ED Diagnoses   Final diagnoses:  Facial injury, initial encounter  Laceration of nose, initial encounter  Open fracture of nasal bone, initial encounter    ED Discharge Orders        Ordered    cephALEXin (KEFLEX) 500 MG capsule  3 times daily     02/11/18 2014       Gregary Cromer 02/11/18 2020    Pricilla Loveless, MD 02/17/18  0707  

## 2018-02-11 NOTE — ED Triage Notes (Signed)
Pt states she walked int door facing approx 230am-abrasion to nose noted-denies LOC-NAD-steady gait

## 2018-02-11 NOTE — Discharge Instructions (Addendum)
As discussed, your imaging showed a nasal bone fracture today. Follow up with ENT in clinic by calling the number provided first thing tomorrow morning. Take your entire course of antibiotics as prescribed.  Your sutures will need to be removed in 5 days.  Monitor closely for any signs of infection and return sooner if redness, swelling, worsening pain, purulence, fever, chills or other concerning symptoms in the meantime. Take ibuprofen as needed for swelling and pain. Follow-up with your primary care provider.

## 2018-07-22 ENCOUNTER — Institutional Professional Consult (permissible substitution): Payer: No Typology Code available for payment source | Admitting: Pulmonary Disease

## 2018-08-05 ENCOUNTER — Other Ambulatory Visit (INDEPENDENT_AMBULATORY_CARE_PROVIDER_SITE_OTHER): Payer: Medicare Other

## 2018-08-05 ENCOUNTER — Ambulatory Visit (INDEPENDENT_AMBULATORY_CARE_PROVIDER_SITE_OTHER): Payer: Medicare Other | Admitting: Pulmonary Disease

## 2018-08-05 ENCOUNTER — Encounter: Payer: Self-pay | Admitting: Pulmonary Disease

## 2018-08-05 VITALS — BP 110/80 | HR 73 | Ht 68.0 in | Wt 152.8 lb

## 2018-08-05 DIAGNOSIS — J449 Chronic obstructive pulmonary disease, unspecified: Secondary | ICD-10-CM

## 2018-08-05 DIAGNOSIS — J849 Interstitial pulmonary disease, unspecified: Secondary | ICD-10-CM

## 2018-08-05 DIAGNOSIS — R0600 Dyspnea, unspecified: Secondary | ICD-10-CM

## 2018-08-05 DIAGNOSIS — R0609 Other forms of dyspnea: Secondary | ICD-10-CM

## 2018-08-05 LAB — CBC WITH DIFFERENTIAL/PLATELET
Basophils Absolute: 0.1 10*3/uL (ref 0.0–0.1)
Basophils Relative: 0.9 % (ref 0.0–3.0)
Eosinophils Absolute: 0.1 10*3/uL (ref 0.0–0.7)
Eosinophils Relative: 0.6 % (ref 0.0–5.0)
HCT: 44.6 % (ref 36.0–46.0)
Hemoglobin: 15.2 g/dL — ABNORMAL HIGH (ref 12.0–15.0)
Lymphocytes Relative: 18.7 % (ref 12.0–46.0)
Lymphs Abs: 1.7 10*3/uL (ref 0.7–4.0)
MCHC: 34.1 g/dL (ref 30.0–36.0)
MCV: 88.7 fl (ref 78.0–100.0)
Monocytes Absolute: 0.7 10*3/uL (ref 0.1–1.0)
Monocytes Relative: 7.5 % (ref 3.0–12.0)
Neutro Abs: 6.6 10*3/uL (ref 1.4–7.7)
Neutrophils Relative %: 72.3 % (ref 43.0–77.0)
Platelets: 279 10*3/uL (ref 150.0–400.0)
RBC: 5.04 Mil/uL (ref 3.87–5.11)
RDW: 15 % (ref 11.5–15.5)
WBC: 9.1 10*3/uL (ref 4.0–10.5)

## 2018-08-05 LAB — POCT EXHALED NITRIC OXIDE: FeNO level (ppb): 8

## 2018-08-05 MED ORDER — FLUTICASONE-UMECLIDIN-VILANT 100-62.5-25 MCG/INH IN AEPB: 1.0000 | INHALATION_SPRAY | Freq: Every day | RESPIRATORY_TRACT | 0 refills | Status: DC

## 2018-08-05 NOTE — Progress Notes (Addendum)
Judy [name]    [id number]    11-07-1950  Primary Care Physician:Howell, Garner Gavel, PA  Referring Physician: Devra Dopp, MD [address] [place], [place] [address]  Chief complaint: Consult for dyspnea  HPI: 67 year old with anxiety, PTSD, bipolar, COPD, asthma  Complains of dyspnea on exertion for the past 1 year.  She has symptoms on exertion, mild symptoms at rest, daily cough with clear mucus, sinus drainage.  She has had multiple hospitalizations over the past year for COPD exacerbations.  Given inhalers including Advair and Symbicort but does not feel it is helping.  Previously evaluated by pulmonary at [facility] and is here for second opinion.  Hospitalized in July 2018 for COPD exacerbation Hospitalized in November 2018 for COPD exacerbation, CT during both admissions were is negative for PE but showed bilateral groundglass opacities.  She was treated with antibiotics, oral prednisone.  Virus panel was positive for adenovirus at that time.  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 Smoking history: 50-pack-year smoker.  Continues to smoke 1 pack/day Travel history: Originally  from [place].  No significant recent travel Relevant family history: Daughter has asthma.  No other significant family history of lung disease.  Outpatient Encounter Medications as of 08/05/2018  Medication Sig  . atorvastatin (LIPITOR) 40 MG tablet Take 40 mg by mouth daily.  . cariprazine (VRAYLAR) capsule Take 1.5 mg by mouth daily.  Marland Kitchen LORazepam (ATIVAN) 1 MG tablet Take 1 mg by mouth every 8 (eight) hours. Take 1.5 tabs po daily  . Lurasidone HCl (LATUDA) 60 MG TABS Take 80 mg by mouth at bedtime.   Marland Kitchen QUEtiapine (SEROQUEL) 200 MG tablet Take 600 mg by mouth at bedtime. Take 600mg  po daily  . [DISCONTINUED] albuterol  (PROVENTIL HFA;VENTOLIN HFA) 108 (90 Base) MCG/ACT inhaler Inhale 2 puffs into the lungs every 6 (six) hours as needed for wheezing or shortness of breath.  . [DISCONTINUED] ALPRAZolam (XANAX) 0.5 MG tablet Take 0.5 mg by mouth 2 (two) times daily as needed for anxiety.   . [DISCONTINUED] benztropine (COGENTIN) 1 MG tablet Take 1 mg at bedtime by mouth.  . [DISCONTINUED] Eszopiclone (ESZOPICLONE) 3 MG TABS Take 3 mg by mouth at bedtime. Take immediately before bedtime  . [DISCONTINUED] guaiFENesin (MUCINEX) 600 MG 12 hr tablet Take 1 tablet (600 mg total) 2 (two) times daily as needed by mouth for cough. (Patient not taking: Reported on 08/05/2018)  . [DISCONTINUED] ipratropium-albuterol (DUONEB) 0.5-2.5 (3) MG/3ML SOLN Take 3 mLs by nebulization every 6 (six) hours as needed. (Patient not taking: Reported on 08/05/2018)  . [DISCONTINUED] mometasone-formoterol (DULERA) 100-5 MCG/ACT AERO Inhale 2 puffs 2 (two) times daily into the lungs. (Patient not taking: Reported on 08/05/2018)  . [DISCONTINUED] predniSONE (DELTASONE) 20 MG tablet Take 2 tablets (40 mg total) daily with breakfast by mouth. 40mg  daily for 7 days (Patient not taking: Reported on 08/05/2018)  . [DISCONTINUED] senna-docusate (SENOKOT-S) 8.6-50 MG tablet Take 1 tablet at bedtime as needed by mouth for mild constipation. (Patient not taking: Reported on 08/05/2018)  . [DISCONTINUED] Vilazodone HCl (VIIBRYD) 40 MG TABS Take 40 mg by mouth daily.    No facility-administered encounter medications on file as of 08/05/2018.     Allergies as of 08/05/2018 - Review Complete 08/05/2018  Allergen Reaction Noted  .  Celexa [citalopram hydrobromide]  08/17/2017    Past Medical History:  Diagnosis Date  . Anxiety   . Bipolar 1 disorder (HCC)   . COPD (chronic obstructive pulmonary disease) (HCC)   . PTSD (post-traumatic stress disorder)     Past Surgical History:  Procedure Laterality Date  . APPENDECTOMY    . bone spurs foot    .  HAND SURGERY Left     Family History  Problem Relation Age of Onset  . CAD Mother 8  . CAD Father     Social History   Socioeconomic History  . Marital status: Married    Spouse name: Not on file  . Number of children: Not on file  . Years of education: Not on file  . Highest education level: Not on file  Occupational History  . Occupation: retired  Engineer, production  . Financial resource strain: Not on file  . Food insecurity:    Worry: Not on file    Inability: Not on file  . Transportation needs:    Medical: Not on file    Non-medical: Not on file  Tobacco Use  . Smoking status: Current Every Day Smoker    Packs/day: 0.50    Years: 43.00    Pack years: 21.50    Types: Cigarettes  . Smokeless tobacco: Never Used  . Tobacco comment: pack per day 08/05/2018  Substance and Sexual Activity  . Alcohol use: Yes    Comment: occ  . Drug use: No  . Sexual activity: Not on file  Lifestyle  . Physical activity:    Days per week: Not on file    Minutes per session: Not on file  . Stress: Not on file  Relationships  . Social connections:    Talks on phone: Not on file    Gets together: Not on file    Attends religious service: Not on file    Active member of club or organization: Not on file    Attends meetings of clubs or organizations: Not on file    Relationship status: Not on file  . Intimate partner violence:    Fear of current or ex partner: Not on file    Emotionally abused: Not on file    Physically abused: Not on file    Forced sexual activity: Not on file  Other Topics Concern  . Not on file  Social History Narrative  . Not on file    Review of systems: Review of Systems  Constitutional: Negative for fever and chills.  HENT: Negative.   Eyes: Negative for blurred vision.  Respiratory: as per HPI  Cardiovascular: Negative for chest pain and palpitations.  Gastrointestinal: Negative for vomiting, diarrhea, blood per rectum. Genitourinary: Negative for  dysuria, urgency, frequency and hematuria.  Musculoskeletal: Negative for myalgias, back pain and joint pain.  Skin: Negative for itching and rash.  Neurological: Negative for dizziness, tremors, focal weakness, seizures and loss of consciousness.  Endo/Heme/Allergies: Negative for environmental allergies.  Psychiatric/Behavioral: Negative for depression, suicidal ideas and hallucinations.  All other systems reviewed and are negative.  Physical Exam: Blood pressure 110/80, pulse 73, height 5\' 8"  (1.727 m), weight 152 lb 12.8 oz (69.3 kg), SpO2 95 %. Gen:      No acute distress HEENT:  EOMI, sclera anicteric Neck:     No masses; no thyromegaly Lungs:    Clear to auscultation bilaterally; normal respiratory effort CV:         Regular rate and rhythm; no murmurs  Abd:      + bowel sounds; soft, non-tender; no palpable masses, no distension Ext:    No edema; adequate peripheral perfusion Skin:      Warm and dry; no rash Neuro: alert and oriented x 3 Psych: normal mood and affect  Data Reviewed: Imaging: CTA 04/15/2017-no pulmonary embolism, patchy upper lobe groundglass opacities CTA 08/23/2018-no PE, patchy bilateral groundglass opacities, predominantly in the bases.  PFTs: Spirometry 05/18/18 FEV1 1.76, F/F 65.  Moderate obstruction.  FENO 08/05/2018-8  Labs: CBC 08/25/2017-WBC 9.6, eos 0%  Assessment:  Consult for dyspnea Likely has COPD based on extensive smoking history, prior spirometry showing moderate obstruction.  Although she has a diagnosis of asthma I am not sure if this is her main issue  We will reevaluate with CBC differential, IgE, alpha-1 antitrypsin levels and phenotype Schedule her for pulmonary function test Give samples of Trelegy. She did not desat on exertion today.  Abnormal CT Prior CTs noted with patchy bilateral groundglass opacities.  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 We will get a high-resolution CT for evaluation of smoking-related ILD  Active smoker Smoking cessation discussed but she is not interested in quitting. She had tried in the past without success We will reassess at next visit.  Time spent counseling-5 minutes.   Health maintenance 08/27/2016-Pneumovax Give flu vaccine today  Plan/Recommendations: - CBC, IgE, alpha-1 antitrypsin - PFTs, - Samples of Trelegy - High-resolution CT - Flu vaccine  Chilton Greathouse MD [facility] 08/05/2018, 10:03 AM  CC: Devra Dopp, MD

## 2018-08-05 NOTE — Addendum Note (Signed)
Addended by: Kerin Ransom on: 08/05/2018 10:59 AM   Modules accepted: Orders

## 2018-08-05 NOTE — Patient Instructions (Signed)
We will give you a sample of an inhaler called trelegy.  Use this for 2 weeks and will review your response to therapy We will check CBC differential, IgE, alpha-1 antitrypsin levels and phenotype  We will check your oxygen levels on exertion today Schedule you for high-resolution CT for dyspnea and evaluation of smoking-related interstitial lung disease We will schedule you for pulmonary function test Follow-up in 2 to 4 weeks.

## 2018-08-10 ENCOUNTER — Ambulatory Visit (INDEPENDENT_AMBULATORY_CARE_PROVIDER_SITE_OTHER): Payer: Medicare Other | Admitting: Pulmonary Disease

## 2018-08-10 DIAGNOSIS — J849 Interstitial pulmonary disease, unspecified: Secondary | ICD-10-CM | POA: Diagnosis not present

## 2018-08-10 LAB — PULMONARY FUNCTION TEST
DL/VA % pred: 65 %
DL/VA: 3.35 ml/min/mmHg/L
DLCO unc % pred: 61 %
DLCO unc: 17.37 ml/min/mmHg
FEF 25-75 Post: 1.37 L/sec
FEF 25-75 Pre: 1.22 L/sec
FEF2575-%Change-Post: 12 %
FEF2575-%Pred-Post: 60 %
FEF2575-%Pred-Pre: 53 %
FEV1-%Change-Post: 6 %
FEV1-%Pred-Post: 83 %
FEV1-%Pred-Pre: 78 %
FEV1-Post: 2.25 L
FEV1-Pre: 2.11 L
FEV1FVC-%Change-Post: 2 %
FEV1FVC-%Pred-Pre: 85 %
FEV6-%Change-Post: 3 %
FEV6-%Pred-Post: 97 %
FEV6-%Pred-Pre: 94 %
FEV6-Post: 3.28 L
FEV6-Pre: 3.18 L
FEV6FVC-%Change-Post: 0 %
FEV6FVC-%Pred-Post: 103 %
FEV6FVC-%Pred-Pre: 103 %
FVC-%Change-Post: 3 %
FVC-%Pred-Post: 94 %
FVC-%Pred-Pre: 91 %
FVC-Post: 3.31 L
FVC-Pre: 3.2 L
Post FEV1/FVC ratio: 68 %
Post FEV6/FVC ratio: 99 %
Pre FEV1/FVC ratio: 66 %
Pre FEV6/FVC Ratio: 99 %

## 2018-08-10 LAB — ALPHA-1 ANTITRYPSIN PHENOTYPE: A-1 Antitrypsin, Ser: 192 mg/dL (ref 83–199)

## 2018-08-10 LAB — IGE: IgE (Immunoglobulin E), Serum: 79 kU/L (ref <=114)

## 2018-08-10 NOTE — Progress Notes (Signed)
PFT completed today.  

## 2018-08-14 ENCOUNTER — Ambulatory Visit (INDEPENDENT_AMBULATORY_CARE_PROVIDER_SITE_OTHER)
Admission: RE | Admit: 2018-08-14 | Discharge: 2018-08-14 | Disposition: A | Discharge status: Home / Self Care | Payer: Medicare Other | Source: Ambulatory Visit | Attending: Pulmonary Disease | Admitting: Pulmonary Disease

## 2018-08-14 DIAGNOSIS — J849 Interstitial pulmonary disease, unspecified: Secondary | ICD-10-CM

## 2018-08-14 IMAGING — CT CT CHEST HIGH RESOLUTION W/O CM
2 of 6 series · 14 of 36 positions shown, 17 images · non-contrast
Comparison: Chest CT [DATE].

CLINICAL DATA: 67-year-old female with history of interstitial lung
disease. Dyspnea on exertion for the past year.

EXAM:
CT CHEST WITHOUT CONTRAST
TECHNIQUE: Multidetector CT imaging of the chest was performed following the
standard protocol without intravenous contrast. High resolution
imaging of the lungs, as well as inspiratory and expiratory imaging,
was performed.

[Series 2: high resolution · axial · 0.65mm/px · z∈[-233,+17]mm · 11 of 151 slices shown, 14 images]
[im 13/151  mediastinal]
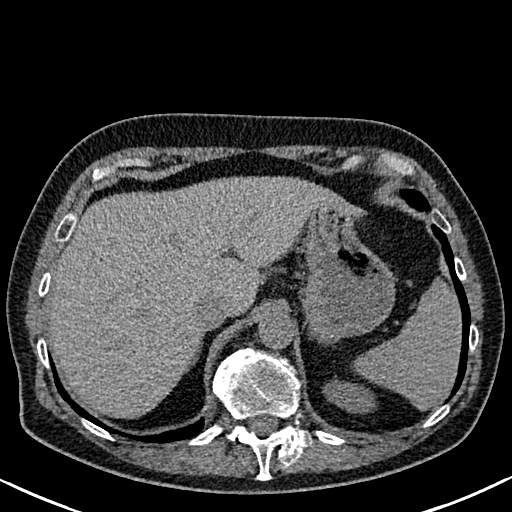
[im 13/151  lung]
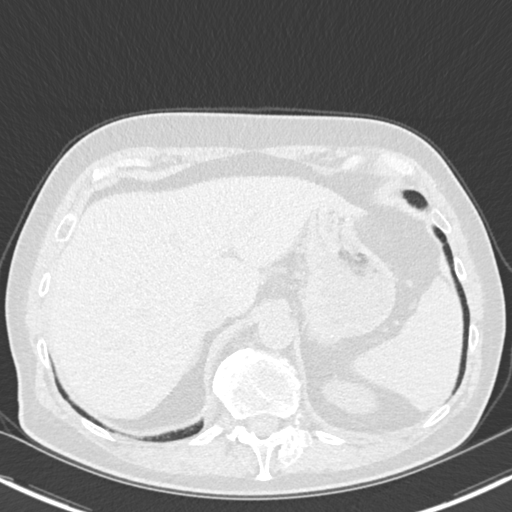
[im 26/151  lung]
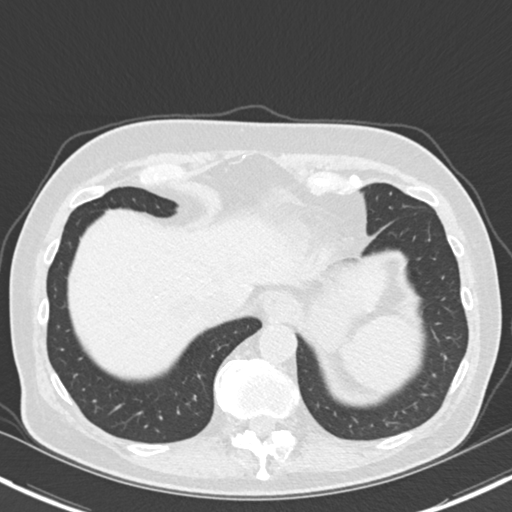
[im 38/151  lung]
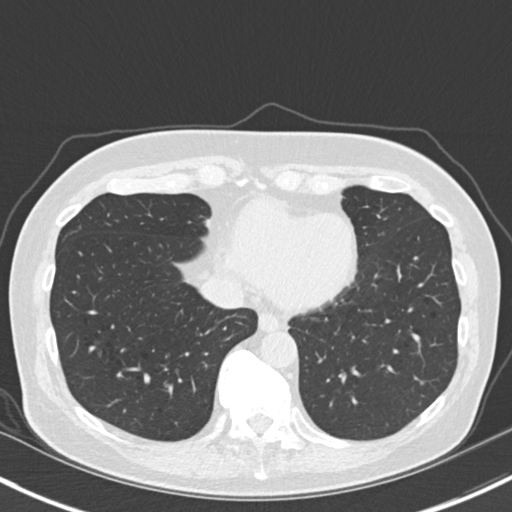
[im 51/151  lung]
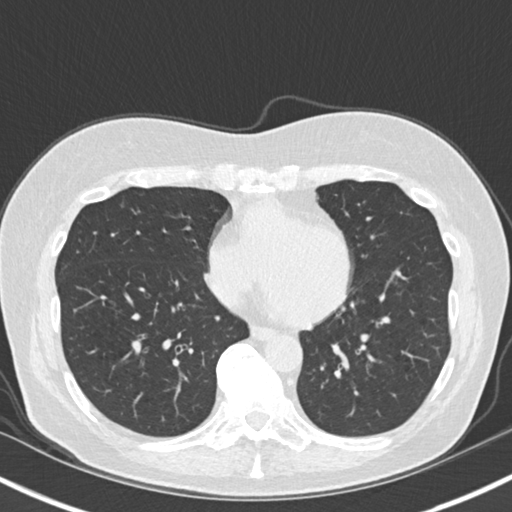
[im 63/151  mediastinal]
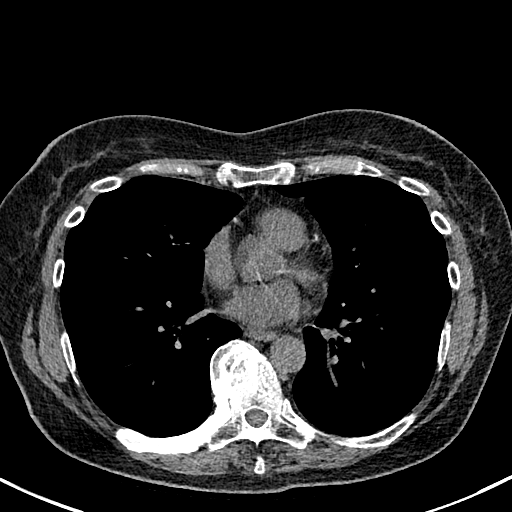
[im 63/151  lung]
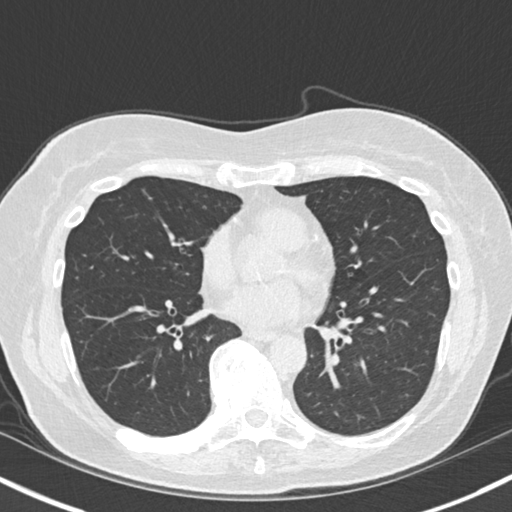
[im 76/151  lung]
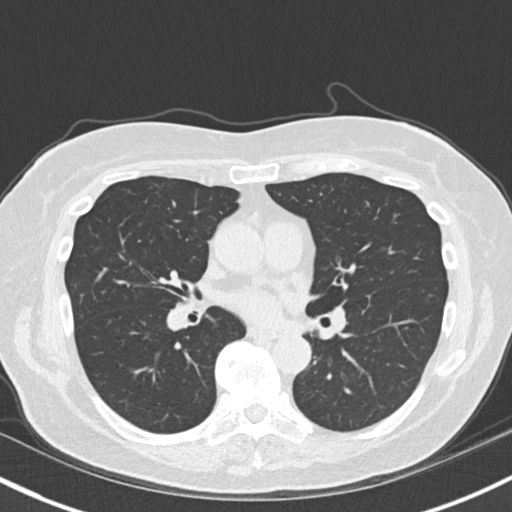
[im 88/151  lung]
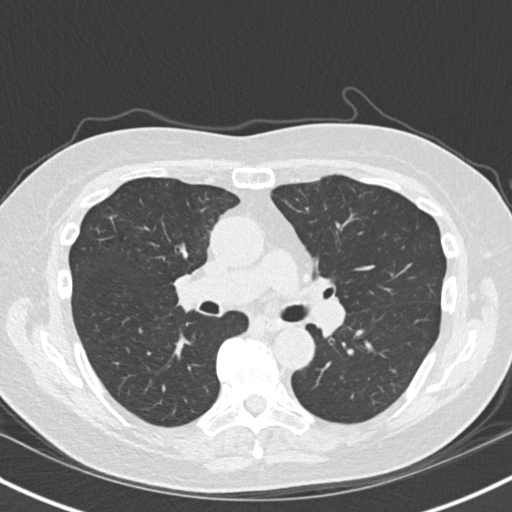
[im 101/151  lung]
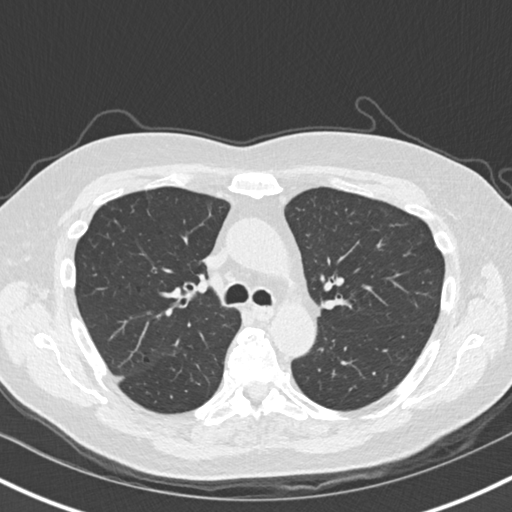
[im 113/151  mediastinal]
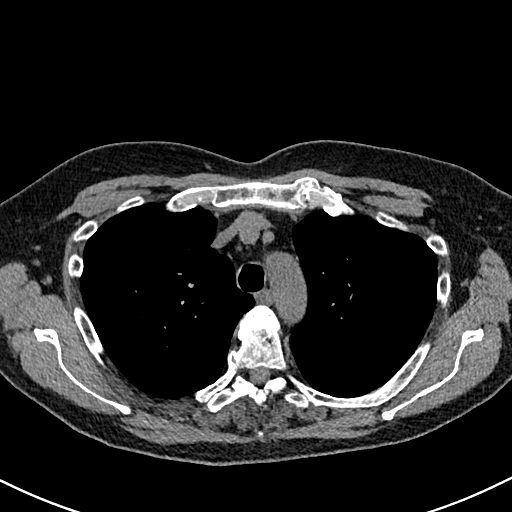
[im 113/151  lung]
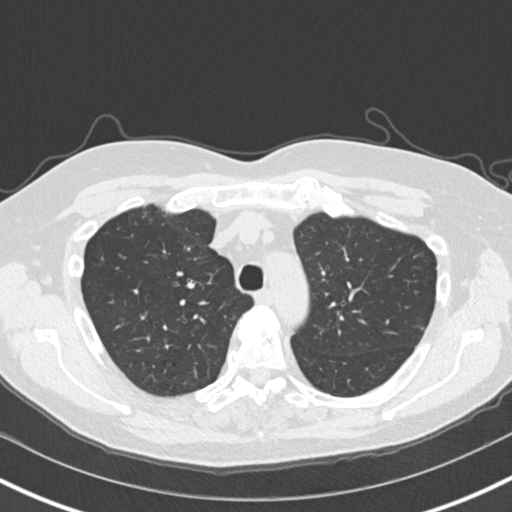
[im 126/151  lung]
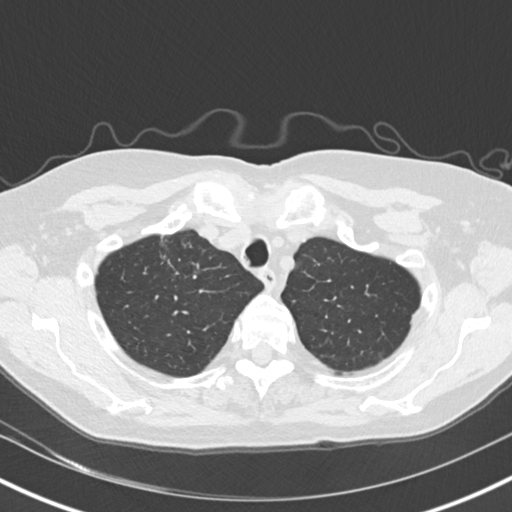
[im 138/151  lung]
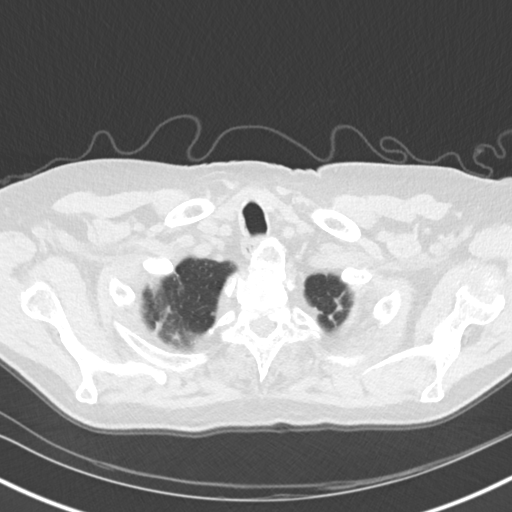

[Series 5: coronal · coronal · 0.65mm/px · 3 of 128 slices shown]
[im 26/128  lung]
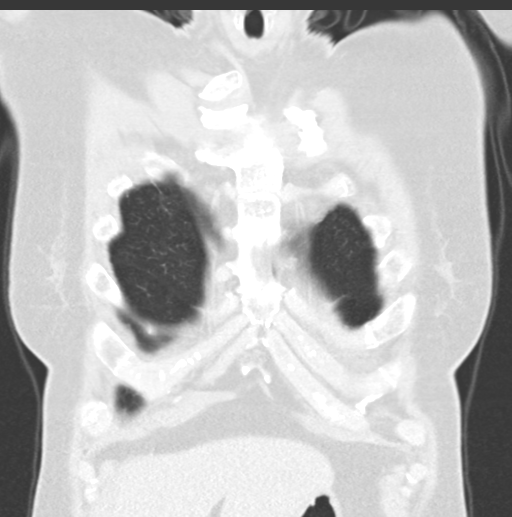
[im 51/128  lung]
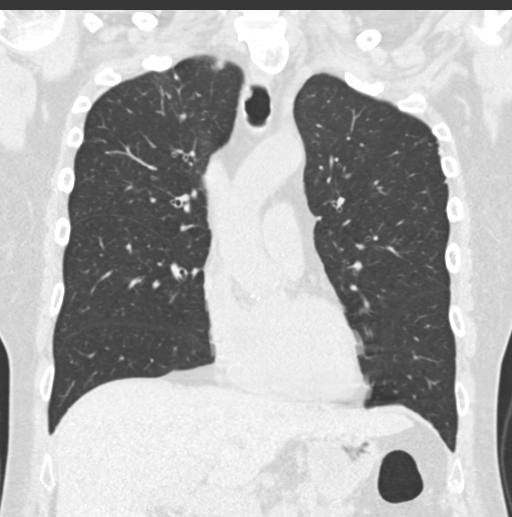
[im 77/128  lung]
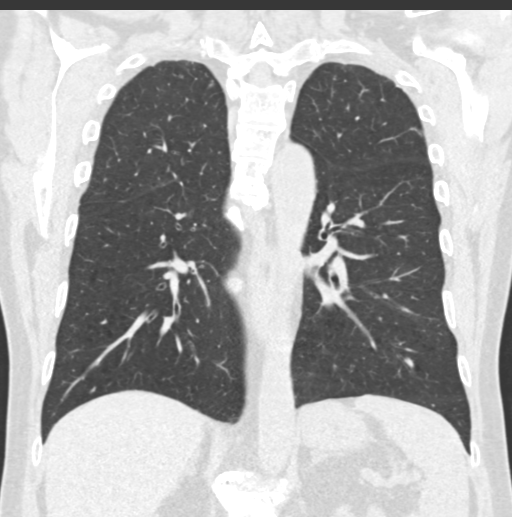

[14 of 36 positions shown; findings below may reference images not displayed]

FINDINGS: Cardiovascular: Heart size is normal. There is no significant
pericardial fluid, thickening or pericardial calcification. There is
aortic atherosclerosis, as well as atherosclerosis of the great
vessels of the mediastinum and the coronary arteries, including
calcified atherosclerotic plaque in the left anterior descending and
right coronary arteries. Calcifications of the aortic valve.

Mediastinum/Nodes: No pathologically enlarged mediastinal or hilar
lymph nodes. Please note that accurate exclusion of hilar adenopathy
is limited on noncontrast CT scans. Esophagus is unremarkable in
appearance. No axillary lymphadenopathy.

Lungs/Pleura: High-resolution images demonstrate no significant
regions of ground-glass attenuation, subpleural reticulation,
parenchymal banding, traction bronchiectasis or frank honeycombing.
Inspiratory and expiratory imaging is unremarkable. Multiple small
pulmonary nodules scattered throughout the lungs bilaterally,
measuring 5 mm or less in size. Some of these are stable compared to
the prior examination while others are new. The largest new nodule
is a 5 mm nodule in the right middle lobe (axial image 103 of series
3). No other larger more suspicious appearing pulmonary nodules or
masses are noted. No acute consolidative airspace disease. No
pleural effusions. Diffuse bronchial wall thickening.

Upper Abdomen: Unremarkable.

Musculoskeletal: Chronic L1 compression fracture with 50% loss of
anterior vertebral body height. There are no aggressive appearing
lytic or blastic lesions noted in the visualized portions of the
skeleton.
IMPRESSION: 1. No findings to suggest interstitial lung disease.
2. Mild diffuse bronchial wall thickening.
3. Aortic atherosclerosis, in addition to 2 vessel coronary artery
disease. Please note that although the presence of coronary artery
calcium documents the presence of coronary artery disease, the
severity of this disease and any potential stenosis cannot be
assessed on this non-gated CT examination. Assessment for potential
risk factor modification, dietary therapy or pharmacologic therapy
may be warranted, if clinically indicated.
4. There are calcifications of the aortic valve. Echocardiographic
correlation for evaluation of potential valvular dysfunction may be
warranted if clinically indicated.
5. Multiple small pulmonary nodules measuring 5 mm or less in size,
nonspecific, but statistically likely benign. No follow-up needed if
patient is low-risk (and has no known or suspected primary
neoplasm). Non-contrast chest CT can be considered in 12 months if
patient is high-risk. This recommendation follows the consensus
statement: Guidelines for Management of Incidental Pulmonary Nodules
Detected on CT Images: From the [HOSPITAL] [LA]; Radiology

Aortic Atherosclerosis ([LA]-[LA]).

## 2018-08-25 ENCOUNTER — Encounter: Payer: Self-pay | Admitting: Pulmonary Disease

## 2018-08-25 ENCOUNTER — Ambulatory Visit (INDEPENDENT_AMBULATORY_CARE_PROVIDER_SITE_OTHER): Payer: Medicare Other | Admitting: Pulmonary Disease

## 2018-08-25 VITALS — BP 138/88 | HR 114 | Ht 70.0 in | Wt 150.4 lb

## 2018-08-25 DIAGNOSIS — J449 Chronic obstructive pulmonary disease, unspecified: Secondary | ICD-10-CM | POA: Diagnosis not present

## 2018-08-25 DIAGNOSIS — R0600 Dyspnea, unspecified: Secondary | ICD-10-CM

## 2018-08-25 DIAGNOSIS — Z23 Encounter for immunization: Secondary | ICD-10-CM

## 2018-08-25 DIAGNOSIS — R739 Hyperglycemia, unspecified: Secondary | ICD-10-CM | POA: Diagnosis not present

## 2018-08-25 DIAGNOSIS — R0609 Other forms of dyspnea: Secondary | ICD-10-CM

## 2018-08-25 NOTE — Progress Notes (Signed)
Judy [name]    [id number]    01/22/1951  Primary Care Physician:Howell, Garner Gavel, PA  Referring Physician: Doyle Askew, PA [address] [place], [place] [address]  Chief complaint: Follow up for COPD  HPI: 67 year old with anxiety, PTSD, bipolar, COPD, asthma  Complains of dyspnea on exertion for the past 1 year.  She has symptoms on exertion, mild symptoms at rest, daily cough with clear mucus, sinus drainage.  She has had multiple hospitalizations over the past year for COPD exacerbations.  Given inhalers including Advair and Symbicort but does not feel it is helping.  Previously evaluated by pulmonary at [facility] and is here for second opinion.  Hospitalized in July 2018 for COPD exacerbation Hospitalized in November 2018 for COPD exacerbation, CT during both admissions were is negative for PE but showed bilateral groundglass opacities.  She was treated with antibiotics, oral prednisone.  Virus panel was positive for adenovirus at that time.  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 Smoking history: 50-pack-year smoker.  Continues to smoke 1 pack/day Travel history: Originally  from [place].  No significant recent travel Relevant family history: Daughter has asthma.  No other significant family history of lung disease.  Interim history: Given trelegy at last visit.  She used this for about 3 weeks and then stopped since she did not notice any difference Continues to have dyspnea on exertion, chronic cough with white mucus.  No new complaints today.  Outpatient Encounter Medications as of 08/25/2018  Medication Sig  . atorvastatin (LIPITOR) 40 MG tablet Take 40 mg by mouth daily.  . cariprazine (VRAYLAR) capsule Take 1.5 mg by mouth daily.  Marland Kitchen LORazepam (ATIVAN) 1 MG tablet Take 1 mg by mouth every 8 (eight)  hours. Take 1.5 tabs po daily  . Lurasidone HCl (LATUDA) 60 MG TABS Take 80 mg by mouth at bedtime.   Marland Kitchen QUEtiapine (SEROQUEL) 200 MG tablet Take 600 mg by mouth at bedtime. Take 600mg  po daily  . [DISCONTINUED] Fluticasone-Umeclidin-Vilant (TRELEGY ELLIPTA) 100-62.5-25 MCG/INH AEPB Inhale 1 puff into the lungs daily. (Patient not taking: Reported on 08/25/2018)   No facility-administered encounter medications on file as of 08/25/2018.    Physical Exam: Blood pressure 138/88, pulse (!) 114, height 5\' 10"  (1.778 m), weight 150 lb 6.4 oz (68.2 kg), SpO2 98 %. Gen:      No acute distress HEENT:  EOMI, sclera anicteric Neck:     No masses; no thyromegaly Lungs:    Clear to auscultation bilaterally; normal respiratory effort CV:         Regular rate and rhythm; no murmurs Abd:      + bowel sounds; soft, non-tender; no palpable masses, no distension Ext:    No edema; adequate peripheral perfusion Skin:      Warm and dry; no rash Neuro: alert and oriented x 3 Psych: normal mood and affect  Data Reviewed: Imaging: CTA 04/15/2017-no pulmonary embolism, patchy upper lobe groundglass opacities CTA 08/23/2018-no PE, patchy bilateral groundglass opacities, predominantly in the bases. High-resolution CT   PFTs: Spirometry 05/18/18 FEV1 1.76, F/F 65.  Moderate obstruction.  08/10/18 FVC 3.31 [94%), FEV1 2.25 [83%), F/F 68, TLC 83%, DLCO 61% Mild obstruction with moderate diffusion defect.  FENO 08/05/2018-8  Labs: CBC 08/25/2017-WBC 9.6, eos 0% CBC 08/05/2018- WBC 9.1, eos 0.6%,  absolute eosinophil count 55  IgE 08/10/2018-79 Alpha-1 antitrypsin 08/10/2018-192, PI MM  Assessment:  Mild COPD PFTs reviewed with mild obstruction, no bronchodilator response.  Labs show normal IgE and low peripheral eosinophils. She has a diagnosis of asthma but this looks more like COPD.  She feels the trelegy is not helping.  We will can stop controller medications and just use albuterol as needed Did not  desat on exertion.  Continue monitoring symptoms.  Abnormal CT, nodules Prior CTs noted with patchy bilateral groundglass opacities.  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  Follow-up high res CT does not show interstitial lung disease. Pulmonary nodules need follow-up.  Refer for low-dose screening CT starting in 2020.  Active smoker Smoking cessation discussed but she is not interested in quitting. She had tried in the past without success We will reassess at next visit.  Time spent counseling-5 minutes.   Health maintenance 08/27/2016-Pneumovax Give flu vaccine today  Plan/Recommendations: - Stop trelegy, continue albuterol as needed - Smoking cessation - Flu vaccine  Chilton GreathousePraveen Mataeo Ingwersen MD [facility] 08/25/2018, 9:35 AM  CC: Doyle AskewHowell, Jennifer W, PA

## 2018-08-25 NOTE — Patient Instructions (Signed)
I reviewed your CT scan which shows changes of chronic bronchitis, pulmonary nodules and coronary atherosclerosis. Pulmonary function test shows moderate COPD Continue using the albuterol as needed. We will observe you off trelegy since he feels not making a difference Continue to work on smoking cessation We will give a flu shot today and referral to primary care.  Follow-up in 6 months.

## 2018-08-25 NOTE — Addendum Note (Signed)
Addended by: Maxwell MarionBLANKENSHIP, Travaughn Vue A on: 08/25/2018 10:10 AM   Modules accepted: Orders

## 2018-09-23 ENCOUNTER — Encounter: Payer: Self-pay | Admitting: Family Medicine

## 2018-09-23 ENCOUNTER — Ambulatory Visit (INDEPENDENT_AMBULATORY_CARE_PROVIDER_SITE_OTHER): Payer: Medicare Other | Admitting: Family Medicine

## 2018-09-23 VITALS — BP 130/60 | HR 103 | Ht 68.0 in | Wt 154.0 lb

## 2018-09-23 DIAGNOSIS — F319 Bipolar disorder, unspecified: Secondary | ICD-10-CM | POA: Diagnosis not present

## 2018-09-23 DIAGNOSIS — Z23 Encounter for immunization: Secondary | ICD-10-CM | POA: Diagnosis not present

## 2018-09-23 DIAGNOSIS — R5383 Other fatigue: Secondary | ICD-10-CM | POA: Diagnosis not present

## 2018-09-23 DIAGNOSIS — E2839 Other primary ovarian failure: Secondary | ICD-10-CM | POA: Diagnosis not present

## 2018-09-23 DIAGNOSIS — E785 Hyperlipidemia, unspecified: Secondary | ICD-10-CM | POA: Diagnosis not present

## 2018-09-23 DIAGNOSIS — E559 Vitamin D deficiency, unspecified: Secondary | ICD-10-CM

## 2018-09-23 DIAGNOSIS — Z1159 Encounter for screening for other viral diseases: Secondary | ICD-10-CM

## 2018-09-23 DIAGNOSIS — E538 Deficiency of other specified B group vitamins: Secondary | ICD-10-CM

## 2018-09-23 NOTE — Progress Notes (Signed)
Judy PlowmanCatherine [name] DOB: 1951/05/22 Encounter date: 09/23/2018  This is a 67 y.o. female who presents to establish care. Chief Complaint  Patient presents with  . New Patient (Initial Visit)    not fasting, would like blood work    History of present illness: Hasn't had cholesterol checked in awhile, even though on medication. Taking lipitor 40mg  daily.   Up to date on other things[place] Mammogram May 2019 normal Colonoscopy done in December (Dr. Steffanie DunnSweeny).   COPD: Does follow with pulmonology (Dr. Delena ServeMammar). Still feels like she is "a fourth short of a breath" but doesn't feel likt it interferres. Inhalers don't help her so she is not not using these. Still smoking but has dropped in half since Monday. Has quit in past with hypnotism and this worked for 6 months.   Bipolar/PTSD: sees Electrical engineerVicky Surratt through [facility]. Has been seeing her for years. Just started on Vraylor without improvement. Has follow up next week.   Had [facility] -  Trying to figure out whether hips or back need surgery.   Has had a lot of change in the last 3 years.  Divorce just finalized on Friday.  Has been married for over 35 years.  Was also drinking in extreme excess and quit this 3 years ago.  States that weight went from 220 to 3 years ago to her current weight.  She feels this is mostly related to all the drinking she was doing, but is also related to her not eating quite as well now that she is living alone.  She does have the support of her daughter who lives locally as well as some other family members who were not local.  She is planning to join a senior fitness group and hopefully make some connections.  Past Medical History:  Diagnosis Date  . Anxiety   . Bipolar 1 disorder (HCC)   . COPD (chronic obstructive pulmonary disease) (HCC)   . PTSD (post-traumatic stress disorder)    Past Surgical History:  Procedure Laterality Date  . bone spurs  foot    . HAND SURGERY Left   . TONSILLECTOMY AND ADENOIDECTOMY     Allergies  Allergen Reactions  . Celexa [Citalopram Hydrobromide]     "goes weird"   Current Meds  Medication Sig  . atorvastatin (LIPITOR) 40 MG tablet Take 40 mg by mouth daily.  . cariprazine (VRAYLAR) capsule Take 3 mg by mouth daily.   Marland Kitchen. LORazepam (ATIVAN) 1 MG tablet Take 1 mg by mouth every 8 (eight) hours. Take 1.5 tabs po daily  . Lurasidone HCl (LATUDA) 60 MG TABS Take 80 mg by mouth at bedtime.   Marland Kitchen. QUEtiapine (SEROQUEL) 200 MG tablet Take 600 mg by mouth at bedtime. Take 600mg  po daily   Social History   Tobacco Use  . Smoking status: Current Every Day Smoker    Packs/day: 0.50    Years: 43.00    Pack years: 21.50    Types: Cigarettes  . Smokeless tobacco: Never Used  . Tobacco comment: pack per day 08/05/2018  Substance Use Topics  . Alcohol use: Yes    Comment: occ   Family History  Problem Relation Age of Onset  . CAD Mother 6680  . COPD Mother   . Depression Mother   . CAD Father [id number]  . Hearing loss Father   . Depression Sister   . Heart attack Maternal Grandmother   . Early death Maternal  Grandfather   . Hearing loss Paternal Grandfather      Review of Systems  Constitutional: Negative for chills, fatigue and fever.  Respiratory: Negative for cough, chest tightness, shortness of breath and wheezing.   Cardiovascular: Negative for chest pain, palpitations and leg swelling.    Objective:  BP 130/60 (BP Location: Left Arm, Patient Position: Sitting, Cuff Size: Normal)   Pulse (!) 103   Ht 5\' 8"  (1.727 m)   Wt 154 lb (69.9 kg)   SpO2 96%   BMI 23.42 kg/m   Weight: 154 lb (69.9 kg)   BP Readings from Last 3 Encounters:  09/23/18 130/60  08/25/18 138/88  08/05/18 110/80   Wt Readings from Last 3 Encounters:  09/23/18 154 lb (69.9 kg)  08/25/18 150 lb 6.4 oz (68.2 kg)  08/05/18 152 lb 12.8 oz (69.3 kg)    Physical Exam Constitutional:      General: She is not in acute  distress.    Appearance: She is well-developed.  Cardiovascular:     Rate and Rhythm: Normal rate and regular rhythm.     Heart sounds: Normal heart sounds. No murmur. No friction rub.     Comments: No lower extremity edema Pulmonary:     Effort: Pulmonary effort is normal. No respiratory distress.     Breath sounds: Normal breath sounds. No wheezing or rales.  Neurological:     Mental Status: She is alert and oriented to person, place, and time.  Psychiatric:        Attention and Perception: Attention normal.        Mood and Affect: Affect is blunt and flat.        Speech: Speech normal.        Behavior: Behavior normal. Behavior is cooperative.        Cognition and Memory: Cognition normal.     Assessment/Plan:  1. Need for pneumococcal vaccination Completed today - Pneumococcal conjugate vaccine 13-valent IM  2. Estrogen deficiency - DG BONE DENSITY (DXA); Future  3. Bipolar 1 disorder (HCC) Follows with psychiatry. They are working on [employer].  4. Hyperlipidemia, unspecified hyperlipidemia type - Lipid panel; Future - Comprehensive metabolic panel; Future  5. Other fatigue - TSH; Future - Vitamin B12; Future - VITAMIN D 25 Hydroxy (Vit-D Deficiency, Fractures); Future  6. B12 deficiency - Vitamin B12; Future  7. Vitamin D deficiency - VITAMIN D 25 Hydroxy (Vit-D Deficiency, Fractures); Future   Return pending bloodwork.  Theodis Shove, MD

## 2018-09-23 NOTE — Addendum Note (Signed)
Addended by: Wynn BankerKOBERLEIN, Peytyn Trine C on: 09/23/2018 05:19 PM   Modules accepted: Orders

## 2018-10-05 ENCOUNTER — Ambulatory Visit (INDEPENDENT_AMBULATORY_CARE_PROVIDER_SITE_OTHER)
Admission: RE | Admit: 2018-10-05 | Discharge: 2018-10-05 | Disposition: A | Discharge status: Home / Self Care | Payer: Medicare Other | Source: Ambulatory Visit | Attending: Family Medicine | Admitting: Family Medicine

## 2018-10-05 DIAGNOSIS — E2839 Other primary ovarian failure: Secondary | ICD-10-CM

## 2018-10-06 ENCOUNTER — Other Ambulatory Visit (INDEPENDENT_AMBULATORY_CARE_PROVIDER_SITE_OTHER): Payer: Medicare Other

## 2018-10-06 DIAGNOSIS — E559 Vitamin D deficiency, unspecified: Secondary | ICD-10-CM

## 2018-10-06 DIAGNOSIS — R7309 Other abnormal glucose: Secondary | ICD-10-CM

## 2018-10-06 DIAGNOSIS — R5383 Other fatigue: Secondary | ICD-10-CM

## 2018-10-06 DIAGNOSIS — E785 Hyperlipidemia, unspecified: Secondary | ICD-10-CM | POA: Diagnosis not present

## 2018-10-06 DIAGNOSIS — E538 Deficiency of other specified B group vitamins: Secondary | ICD-10-CM | POA: Diagnosis not present

## 2018-10-06 DIAGNOSIS — Z1159 Encounter for screening for other viral diseases: Secondary | ICD-10-CM

## 2018-10-06 LAB — LIPID PANEL
Cholesterol: 154 mg/dL (ref 0–200)
HDL: 40.5 mg/dL (ref >39.00)
LDL Cholesterol: 85 mg/dL (ref 0–99)
NonHDL: 113.87
Total CHOL/HDL Ratio: 4
Triglycerides: 144 mg/dL (ref 0.0–149.0)
VLDL: 28.8 mg/dL (ref 0.0–40.0)

## 2018-10-06 LAB — COMPREHENSIVE METABOLIC PANEL
ALT: 11 U/L (ref 0–35)
AST: 11 U/L (ref 0–37)
Albumin: 3.9 g/dL (ref 3.5–5.2)
Alkaline Phosphatase: 169 U/L — ABNORMAL HIGH (ref 39–117)
BUN: 15 mg/dL (ref 6–23)
CO2: 28 mEq/L (ref 19–32)
Calcium: 9.6 mg/dL (ref 8.4–10.5)
Chloride: 106 mEq/L (ref 96–112)
Creatinine, Ser: 0.92 mg/dL (ref 0.40–1.20)
GFR: 64.77 mL/min (ref >60.00)
Glucose, Bld: 117 mg/dL — ABNORMAL HIGH (ref 70–99)
Potassium: 4.1 mEq/L (ref 3.5–5.1)
Sodium: 142 mEq/L (ref 135–145)
Total Bilirubin: 0.5 mg/dL (ref 0.2–1.2)
Total Protein: 6.1 g/dL (ref 6.0–8.3)

## 2018-10-06 LAB — VITAMIN D 25 HYDROXY (VIT D DEFICIENCY, FRACTURES): VITD: 9.18 ng/mL — ABNORMAL LOW (ref 30.00–100.00)

## 2018-10-06 LAB — TSH: TSH: 1.78 u[IU]/mL (ref 0.35–4.50)

## 2018-10-06 LAB — VITAMIN B12: Vitamin B-12: 108 pg/mL — ABNORMAL LOW (ref 211–911)

## 2018-10-08 LAB — HEP C AB W/REFL
HEPATITIS C ANTIBODY REFILL$(REFL): NONREACTIVE
SIGNAL TO CUT-OFF: 0.01 (ref <1.00)

## 2018-10-08 LAB — REFLEX TIQ

## 2018-10-14 ENCOUNTER — Ambulatory Visit (INDEPENDENT_AMBULATORY_CARE_PROVIDER_SITE_OTHER): Payer: Medicare Other | Admitting: *Deleted

## 2018-10-14 DIAGNOSIS — E538 Deficiency of other specified B group vitamins: Secondary | ICD-10-CM | POA: Diagnosis not present

## 2018-10-14 MED ORDER — VITAMIN D (ERGOCALCIFEROL) 1.25 MG (50000 UNIT) PO CAPS: 50000.0000 [IU] | ORAL_CAPSULE | ORAL | 0 refills | Status: DC

## 2018-10-14 MED ORDER — CYANOCOBALAMIN 1000 MCG/ML IJ SOLN
1000.0000 ug | Freq: Once | INTRAMUSCULAR | Status: AC
  Administered 2018-10-14: 1000 ug via INTRAMUSCULAR

## 2018-10-14 NOTE — Addendum Note (Signed)
Addended by: Solon Augusta on: 10/14/2018 04:24 PM   Modules accepted: Orders

## 2018-10-14 NOTE — Progress Notes (Signed)
Per orders of Dr. Koberlein, injection of Cyanocobalamin 1000mcg given by Saran Laviolette A. Patient tolerated injection well. 

## 2018-10-21 ENCOUNTER — Telehealth: Payer: Self-pay

## 2018-10-21 ENCOUNTER — Ambulatory Visit (INDEPENDENT_AMBULATORY_CARE_PROVIDER_SITE_OTHER): Payer: Medicare Other

## 2018-10-21 DIAGNOSIS — E538 Deficiency of other specified B group vitamins: Secondary | ICD-10-CM | POA: Diagnosis not present

## 2018-10-21 MED ORDER — CYANOCOBALAMIN 1000 MCG/ML IJ SOLN
1000.0000 ug | Freq: Once | INTRAMUSCULAR | Status: AC
  Administered 2018-10-21: 1000 ug via INTRAMUSCULAR

## 2018-10-21 NOTE — Progress Notes (Signed)
Per orders of Dr. Koberlein, injection of B12 given by Mellony Danziger. Patient tolerated injection well. 

## 2018-10-21 NOTE — Telephone Encounter (Signed)
Patient was seen in the office for a B12 injection today. She asked about the Vitamin D that she was asked to start. The patient states that she is already taking Vitamin D and would like to know if she is supposed to take this along with the 50,000 units she was asked to start or does she need 50,000 units total?  Please advise.

## 2018-10-23 NOTE — Telephone Encounter (Signed)
I don't have her previous dose of vitamin D. For now she can just do the weekly 50,000 and then resume her OTC dose after (would recommend 2000 units daily)

## 2018-10-23 NOTE — Telephone Encounter (Signed)
Spoke with patient she is aware to take 50,000 IU weekly

## 2018-10-28 ENCOUNTER — Ambulatory Visit (INDEPENDENT_AMBULATORY_CARE_PROVIDER_SITE_OTHER): Payer: Medicare Other | Admitting: *Deleted

## 2018-10-28 ENCOUNTER — Other Ambulatory Visit (INDEPENDENT_AMBULATORY_CARE_PROVIDER_SITE_OTHER): Payer: Medicare Other

## 2018-10-28 DIAGNOSIS — R739 Hyperglycemia, unspecified: Secondary | ICD-10-CM

## 2018-10-28 DIAGNOSIS — E538 Deficiency of other specified B group vitamins: Secondary | ICD-10-CM | POA: Diagnosis not present

## 2018-10-28 LAB — HEMOGLOBIN A1C: Hgb A1c MFr Bld: 5.7 % (ref 4.6–6.5)

## 2018-10-28 MED ORDER — CYANOCOBALAMIN 1000 MCG/ML IJ SOLN
1000.0000 ug | Freq: Once | INTRAMUSCULAR | Status: AC
  Administered 2018-10-28: 1000 ug via INTRAMUSCULAR

## 2018-11-05 ENCOUNTER — Ambulatory Visit (INDEPENDENT_AMBULATORY_CARE_PROVIDER_SITE_OTHER): Payer: Medicare Other | Admitting: *Deleted

## 2018-11-05 DIAGNOSIS — E538 Deficiency of other specified B group vitamins: Secondary | ICD-10-CM

## 2018-11-05 MED ORDER — CYANOCOBALAMIN 1000 MCG/ML IJ SOLN
1000.0000 ug | Freq: Once | INTRAMUSCULAR | Status: AC
  Administered 2018-11-05: 1000 ug via INTRAMUSCULAR

## 2018-11-05 NOTE — Progress Notes (Signed)
Per orders of Dr. Hassan Rowan, injection of B 12 given by Jobe Gibbon. Patient tolerated injection well.

## 2018-11-26 ENCOUNTER — Ambulatory Visit: Payer: Medicare Other

## 2018-12-03 ENCOUNTER — Ambulatory Visit: Payer: Medicare Other

## 2018-12-04 ENCOUNTER — Telehealth: Payer: Self-pay

## 2018-12-04 NOTE — Telephone Encounter (Signed)
We received a form for surgical clearance. Per Dr. Hassan Rowan, the patient will need a pre-operative OV.

## 2018-12-04 NOTE — Telephone Encounter (Signed)
Spoke with patient. Appointment has been made for 12/09/2018. Form is in [name]'s blue folder.

## 2018-12-09 ENCOUNTER — Ambulatory Visit: Payer: Medicare Other | Admitting: Family Medicine

## 2018-12-09 DIAGNOSIS — E538 Deficiency of other specified B group vitamins: Secondary | ICD-10-CM | POA: Insufficient documentation

## 2018-12-09 NOTE — Progress Notes (Deleted)
Judy [name] Date of Birth:  [birth year]  This patient presents to the office today for a preoperativeconsultation at the request of surgeon, Dr. ***, who plans on performing *** on{Time; month:10108} {Numbers; 0-31:32273} at ***.    Planned anesthesia: {Procedures; anesthesia:812}  Known anesthesia problems: {ANESTHESIA PROBLEMS:20006}  Bleeding risk: {BLEEDING RISK:20010} Personal or FH of DVT/PE: {NO/[phone]}    Patient Active Problem List   Diagnosis Date Noted  . B12 deficiency 12/09/2018  . COPD with acute exacerbation (HCC) 04/14/2017  . Hyperglycemia 04/14/2017  . Acute on chronic respiratory failure with hypoxia (HCC) 04/13/2017   Past Surgical History:  Procedure Laterality Date  . bone spurs foot    . HAND SURGERY Left   . TONSILLECTOMY AND ADENOIDECTOMY      Allergies  Allergen Reactions  . Celexa [Citalopram Hydrobromide]     "goes weird"   No outpatient medications have been marked as taking for the 12/09/18 encounter (Appointment) with Wynn Banker, MD.    Social History   Tobacco Use  . Smoking status: Current Every Day Smoker    Packs/day: 0.50    Years: 43.00    Pack years: 21.50    Types: Cigarettes  . Smokeless tobacco: Never Used  . Tobacco comment: pack per day 08/05/2018  Substance Use Topics  . Alcohol use: Yes    Comment: occ   Family History  Problem Relation Age of Onset  . CAD Mother 51  . COPD Mother   . Depression Mother   . CAD Father 80  . Hearing loss Father   . Depression Sister   . Heart attack Maternal Grandmother   . Early death Maternal Grandfather   . Hearing loss Paternal Grandfather     Review of Systems {ROS Comprehensive:22074}   Recent Labs: CBC:  Lab Results  Component Value Date   WBC 9.1 08/05/2018   HGB 15.2 (H) 08/05/2018   HCT 44.6 08/05/2018   MCH 28.4 08/25/2017   MCHC 34.1 08/05/2018   RDW 15.0 08/05/2018   PLT 279.0 08/05/2018   CMP:  Lab Results  Component Value Date   NA 142 10/06/2018   K 4.1 10/06/2018   CL 106 10/06/2018   CO2 28 10/06/2018   ANIONGAP 7 08/25/2017   GLUCOSE 117 (H) 10/06/2018   BUN 15 10/06/2018   CREATININE 0.92 10/06/2018   GFRAA >60 08/25/2017   CALCIUM 9.6 10/06/2018   PROT 6.1 10/06/2018   BILITOT 0.5 10/06/2018   ALKPHOS 169 (H) 10/06/2018   ALT 11 10/06/2018   AST 11 10/06/2018    HBA1C: No results found for: LABA1C, EAG  Objective:   There were no vitals taken for this visit.    Physical Exam   EKG Interpretation:  {ekg findings:315101::"unchangedfrom previous tracings","normal EKG, normal sinus rhythm"}.  Lab Review {YES/NO:19732}    Assessment:   Diagnoses and all orders for this visit:  B12 deficiency   68 y.o.patient  approved for Surgery     Plan:   1. Preoperative workup as follows:{STUDIES; PRE-OP U/Q:33354} 2. Change in medication regimen before surgery: {MEDICATIONREGIMEN BEFORE SURGERY:20007} 3. {Preop assessment:15915}  Note electronically signed by provider.  Theodis Shove, MD

## 2018-12-11 ENCOUNTER — Telehealth: Payer: Self-pay | Admitting: *Deleted

## 2018-12-11 ENCOUNTER — Ambulatory Visit (INDEPENDENT_AMBULATORY_CARE_PROVIDER_SITE_OTHER): Payer: Medicare Other | Admitting: Family Medicine

## 2018-12-11 ENCOUNTER — Encounter: Payer: Self-pay | Admitting: Family Medicine

## 2018-12-11 VITALS — BP 100/60 | HR 112 | Temp 97.4°F | Ht 68.0 in | Wt 159.2 lb

## 2018-12-11 DIAGNOSIS — Z01810 Encounter for preprocedural cardiovascular examination: Secondary | ICD-10-CM

## 2018-12-11 DIAGNOSIS — R748 Abnormal levels of other serum enzymes: Secondary | ICD-10-CM

## 2018-12-11 DIAGNOSIS — E538 Deficiency of other specified B group vitamins: Secondary | ICD-10-CM | POA: Diagnosis not present

## 2018-12-11 DIAGNOSIS — F4310 Post-traumatic stress disorder, unspecified: Secondary | ICD-10-CM | POA: Diagnosis not present

## 2018-12-11 DIAGNOSIS — M25552 Pain in left hip: Secondary | ICD-10-CM

## 2018-12-11 DIAGNOSIS — F319 Bipolar disorder, unspecified: Secondary | ICD-10-CM | POA: Insufficient documentation

## 2018-12-11 DIAGNOSIS — J449 Chronic obstructive pulmonary disease, unspecified: Secondary | ICD-10-CM | POA: Diagnosis not present

## 2018-12-11 DIAGNOSIS — F419 Anxiety disorder, unspecified: Secondary | ICD-10-CM

## 2018-12-11 DIAGNOSIS — Z1159 Encounter for screening for other viral diseases: Secondary | ICD-10-CM

## 2018-12-11 LAB — CBC WITH DIFFERENTIAL/PLATELET
Basophils Absolute: 0 10*3/uL (ref 0.0–0.1)
Basophils Relative: 0.5 % (ref 0.0–3.0)
Eosinophils Absolute: 0.1 10*3/uL (ref 0.0–0.7)
Eosinophils Relative: 1.2 % (ref 0.0–5.0)
HCT: 39.2 % (ref 36.0–46.0)
Hemoglobin: 13.1 g/dL (ref 12.0–15.0)
Lymphocytes Relative: 25.3 % (ref 12.0–46.0)
Lymphs Abs: 1.7 10*3/uL (ref 0.7–4.0)
MCHC: 33.4 g/dL (ref 30.0–36.0)
MCV: 92.8 fl (ref 78.0–100.0)
Monocytes Absolute: 0.5 10*3/uL (ref 0.1–1.0)
Monocytes Relative: 7.7 % (ref 3.0–12.0)
Neutro Abs: 4.4 10*3/uL (ref 1.4–7.7)
Neutrophils Relative %: 65.3 % (ref 43.0–77.0)
Platelets: 234 10*3/uL (ref 150.0–400.0)
RBC: 4.22 Mil/uL (ref 3.87–5.11)
RDW: 13.8 % (ref 11.5–15.5)
WBC: 6.7 10*3/uL (ref 4.0–10.5)

## 2018-12-11 LAB — COMPREHENSIVE METABOLIC PANEL
ALT: 14 U/L (ref 0–35)
AST: 10 U/L (ref 0–37)
Albumin: 3.8 g/dL (ref 3.5–5.2)
Alkaline Phosphatase: 139 U/L — ABNORMAL HIGH (ref 39–117)
BUN: 19 mg/dL (ref 6–23)
CO2: 26 mEq/L (ref 19–32)
Calcium: 9.7 mg/dL (ref 8.4–10.5)
Chloride: 106 mEq/L (ref 96–112)
Creatinine, Ser: 0.9 mg/dL (ref 0.40–1.20)
GFR: 62.48 mL/min (ref >60.00)
Glucose, Bld: 65 mg/dL — ABNORMAL LOW (ref 70–99)
Potassium: 4.2 mEq/L (ref 3.5–5.1)
Sodium: 140 mEq/L (ref 135–145)
Total Bilirubin: 0.3 mg/dL (ref 0.2–1.2)
Total Protein: 5.8 g/dL — ABNORMAL LOW (ref 6.0–8.3)

## 2018-12-11 NOTE — Telephone Encounter (Signed)
Patient did not leave enough urine to run test per lab.   Per Florentina Addison with lab "Judy [name] did not leave enough urine for testing per lab. i have already told dr Hassan Rowan and she said to have pt come back 2 weeks before surg for urine sample i called pt x2 and left message."   CRM created.

## 2018-12-11 NOTE — Progress Notes (Signed)
Judy [name] Date of Birth:  [birth year]  This patient presents to the office today for a preoperative consultation at the request of surgeon, Dr. Eulah Pont, who plans on performing left total hip surgery onpending surgical clearance.   Planned anesthesia: general  Known anesthesia problems: Negative  Bleeding risk: none Personal or FH of DVT/PE: none.     Patient Active Problem List   Diagnosis Date Noted  . PTSD (post-traumatic stress disorder)   . COPD (chronic obstructive pulmonary disease) (HCC)   . Bipolar 1 disorder (HCC)   . Anxiety   . B12 deficiency 12/09/2018  . COPD with acute exacerbation (HCC) 04/14/2017  . Hyperglycemia 04/14/2017   Past Surgical History:  Procedure Laterality Date  . bone spurs foot    . HAND SURGERY Left   . TONSILLECTOMY AND ADENOIDECTOMY      Allergies  Allergen Reactions  . Celexa [Citalopram Hydrobromide]     "goes weird"   Current Meds  Medication Sig  . atorvastatin (LIPITOR) 40 MG tablet Take 40 mg by mouth daily.  . cariprazine (VRAYLAR) capsule Take 3 mg by mouth daily.   Marland Kitchen LORazepam (ATIVAN) 1 MG tablet Take 1 mg by mouth every 8 (eight) hours. Take 1.5 tabs po daily  . Lurasidone HCl (LATUDA) 60 MG TABS Take 80 mg by mouth at bedtime.   Marland Kitchen QUEtiapine (SEROQUEL) 200 MG tablet Take 600 mg by mouth at bedtime. Take 600mg  po daily  . Vitamin D, Ergocalciferol, (DRISDOL) 1.25 MG (50000 UT) CAPS capsule Take 1 capsule (50,000 Units total) by mouth every 7 (seven) days. Start Vitamin D 2,000 OTC every day    Social History   Tobacco Use  . Smoking status: Former Smoker    Packs/day: 0.50    Years: 43.00    Pack years: 21.50    Types: Cigarettes    Last attempt to quit: 10/07/2018    Years since quitting: 0.1  . Smokeless tobacco: Never Used  . Tobacco comment: pack per day 08/05/2018  Substance Use Topics  . Alcohol use: Yes    Comment: occ   Family History  Problem Relation Age of Onset  . CAD Mother 33  . COPD Mother    . Depression Mother   . CAD Father 15  . Hearing loss Father   . Depression Sister   . Heart attack Maternal Grandmother   . Early death Maternal Grandfather   . Hearing loss Paternal Grandfather     Review of Systems Left hip in constant pain. No other significant aches and pains at this point - hip pain is so severe she feels it is covering up any other possible pains.   Breathing has been ok; states that there has been improvement with this. No more wheezing. She quit smoking first of year with hypnotism. Has used inhalers in past but didn't find them helpful. Was hospitalized with breathing issues 2 summers ago - had resp bug that was difficult to treat. That was only hospital related resp visit.   Mood has been "down the toilet"; between hip pain and all of dental work that was needed - states that dentist is calling to touch base with ortho to see what they are wanting in order to get her ready for surgery.   Recent Labs: CBC:  Lab Results  Component Value Date   WBC 6.7 12/11/2018   HGB 13.1 12/11/2018   HCT 39.2 12/11/2018   MCH 28.4 08/25/2017   MCHC 33.4 12/11/2018  RDW 13.8 12/11/2018   PLT 234.0 12/11/2018   CMP:  Lab Results  Component Value Date   NA 140 12/11/2018   K 4.2 12/11/2018   CL 106 12/11/2018   CO2 26 12/11/2018   ANIONGAP 7 08/25/2017   GLUCOSE 65 (L) 12/11/2018   BUN 19 12/11/2018   CREATININE 0.90 12/11/2018   GFRAA >60 08/25/2017   CALCIUM 9.7 12/11/2018   PROT 5.8 (L) 12/11/2018   BILITOT 0.3 12/11/2018   ALKPHOS 139 (H) 12/11/2018   ALT 14 12/11/2018   AST 10 12/11/2018    HBA1C: No results found for: LABA1C, EAG  Objective:   BP 100/60 (BP Location: Left Arm, Patient Position: Sitting, Cuff Size: Normal)   Pulse (!) 112   Temp (!) 97.4 F (36.3 C) (Oral)   Ht 5\' 8"  (1.727 m)   Wt 159 lb 3.2 oz (72.2 kg)   SpO2 98%   BMI 24.21 kg/m  Weight: 159 lb 3.2 oz (72.2 kg)  Physical Exam Constitutional:      General: She is not  in acute distress.    Appearance: She is well-developed and underweight.  HENT:     Head: Normocephalic and atraumatic.     Right Ear: External ear normal.     Left Ear: External ear normal.     Mouth/Throat:     Pharynx: No oropharyngeal exudate.  Eyes:     Conjunctiva/sclera: Conjunctivae normal.     Pupils: Pupils are equal, round, and reactive to light.  Neck:     Musculoskeletal: Normal range of motion and neck supple.     Thyroid: No thyromegaly.  Cardiovascular:     Rate and Rhythm: Normal rate and regular rhythm.     Heart sounds: Normal heart sounds. No murmur. No friction rub. No gallop.   Pulmonary:     Effort: Pulmonary effort is normal.     Breath sounds: Decreased breath sounds (slightly) present. No wheezing, rhonchi or rales.     Comments: She does have some pursed lip breathing but she does move air pretty well diffusely.  Abdominal:     General: Bowel sounds are normal. There is no distension.     Palpations: Abdomen is soft. There is no mass.     Tenderness: There is no abdominal tenderness. There is no guarding.     Hernia: No hernia is present.  Musculoskeletal: Normal range of motion.        General: No tenderness or deformity.  Lymphadenopathy:     Cervical: No cervical adenopathy.  Skin:    General: Skin is warm and dry.     Findings: No rash.  Neurological:     Mental Status: She is alert and oriented to person, place, and time.     Deep Tendon Reflexes: Reflexes normal.     Reflex Scores:      Tricep reflexes are 2+ on the right side and 2+ on the left side.      Bicep reflexes are 2+ on the right side and 2+ on the left side.      Brachioradialis reflexes are 2+ on the right side and 2+ on the left side.      Patellar reflexes are 2+ on the right side and 2+ on the left side. Psychiatric:        Speech: Speech normal.        Behavior: Behavior normal.        Thought Content: Thought content normal.  EKG Interpretation:  normal EKG,  normal sinus rhythm, unchangedfrom previous tracings.    Assessment:   Judy EvansCatherine was seen today for surgical clearance.  Diagnoses and all orders for this visit:  B12 deficiency- she is getting injections; continue with these and will plan to monitor levels.  PTSD (post-traumatic stress disorder) - following with psychiatry. Mood currently affected by pain, limited mobility.  Chronic obstructive pulmonary disease, unspecified COPD type (HCC)  Bipolar 1 disorder (HCC)  Anxiety  Left hip pain  Preoperative cardiovascular examination -     CBC with Differential/Platelet; Future -     Urinalysis -     EKG 12-Lead -     CBC with Differential/Platelet  Elevated alkaline phosphatase level -     Comprehensive metabolic panel; Future -     Comprehensive metabolic panel  Encounter for hepatitis C screening test for low risk patient -     Hep C Antibody   67 y.o.patient medically stable for Surgery; but please see documentation below:     Plan:   1. Preoperative workup as follows:labwork 2. Change in medication regimen before surgery:none 3. Per patient her dentist will be in touch with ortho to discuss treatment plan. Although she has COPD history, her breathing has been stable. I do not feel that further pulmonary evaluation is warrented at this time.  I would just recommend incentive spirometry and close monitoring in peri-operative period as she would be higher risk for post op resp infection. She has stopped smoking since January which improves her risk profile. I do feel that she needs to proceed with surgery as pain and mobility are extremely limiting for her.   Note electronically signed by provider.  Theodis ShoveJunell Koberlein, MD

## 2018-12-14 LAB — HEPATITIS C ANTIBODY
Hepatitis C Ab: NONREACTIVE
SIGNAL TO CUT-OFF: 0.01 (ref <1.00)

## 2018-12-14 LAB — TIQ-MISC

## 2018-12-25 ENCOUNTER — Telehealth (INDEPENDENT_AMBULATORY_CARE_PROVIDER_SITE_OTHER): Payer: Self-pay

## 2018-12-25 NOTE — Telephone Encounter (Signed)
Called patient and asked the screening questions.  Do you have now or have you had in the past 7 days a fever and/or chills? NO  Do you have now or have you had in the past 7 days a cough? NO  Do you have now or have you had in the last 7 days nausea, vomiting or abdominal pain? NO  Have you been exposed to anyone who has tested positive for COVID-19? NO  Have you or anyone who lives with you traveled within the last month? NO 

## 2018-12-28 ENCOUNTER — Ambulatory Visit (INDEPENDENT_AMBULATORY_CARE_PROVIDER_SITE_OTHER): Payer: Medicare Other | Admitting: Orthopaedic Surgery

## 2018-12-28 ENCOUNTER — Other Ambulatory Visit: Payer: Self-pay

## 2018-12-28 DIAGNOSIS — M87052 Idiopathic aseptic necrosis of left femur: Secondary | ICD-10-CM | POA: Diagnosis not present

## 2018-12-28 HISTORY — DX: Idiopathic aseptic necrosis of left femur: M87.052

## 2018-12-28 MED ORDER — ACETAMINOPHEN-CODEINE #3 300-30 MG PO TABS: 1.0000 | ORAL_TABLET | Freq: Three times a day (TID) | ORAL | 0 refills | Status: DC | PRN

## 2018-12-28 NOTE — Progress Notes (Signed)
Office Visit Note   [name]           Date of Birth: 1951-05-29           MRN: [id number] Visit Date: 12/28/2018              Requested by: Wynn Banker, MD [address] [place], [place] [address] PCP: Wynn Banker, MD   Assessment & Plan: Visit Diagnoses:  1. Avascular necrosis of bone of hip, left (HCC)     Plan: I do feel that she has a component of AVN with both hips as well as superimposed osteo-arthritis.  I went over hip replacement model with her and explained in detail what the surgery involves.  I do feel that she needs an acute left total hip arthroplasty based on her clinical exam and what I am seeing on x-rays.  I am concerned about the potential of femoral head collapse.  She understands that surgery is being recommended.  We talked in detail about the risk and benefits of the surgery and what is involved.  We talked about her intraoperative and postoperative course.  She understands that we are also having to delay surgery due to the recent outbreak of coronavirus and once we are allowed to start rescheduling nonemergent surgeries we will do so.  All question concerns were answered and addressed.  We will work on getting her scheduled for surgery at the appropriate time.  We will send in some Tylenol 3 for her in the interim as well.  Follow-Up Instructions: Return for 2 weeks post-op.   Orders:  No orders of the defined types were placed in this encounter.  No orders of the defined types were placed in this encounter.     Procedures: No procedures performed   Clinical Data: No additional findings.   Subjective: Chief Complaint  Patient presents with  . Left Hip - Pain  The patient is a very pleasant 68 year old female who comes in with debilitating left hip pain and a second opinion about hip replacement surgery.  He has been told she needs both of her hips replaced.  She felt that the other physician was a little bit  abrasive with her and she wants to consider seeing [place] for hip replacement.  She is someone who has chronic COPD but is not on oxygen.  She does have bipolar disease.  She is very thin individual and is not diabetic.  She has had multiple injections in both hips and it did help at first but now is not helping at all.  She has had had steroids in the past for her lung disease.  This pain is been significantly getting worse for her over a year.  At this point is detriment affecting her mobility, her quality of life and her activities daily living.  She has to mainly get around in a wheelchair.  Her daughter is with her today as well.  HPI  Review of Systems She currently denies any headache, chest pain, shortness of breath, fever, chills, nausea, vomiting.  She does get occasional shortness of breath but not today.  She is now on home O2.  Objective: Vital Signs: There were no vitals taken for this visit.  Physical Exam She is alert and orient x3 and in no acute distress Ortho Exam Examination of both hips shows severe pain in the groin with internal and external rotation.  I can barely put her left hip through rotation but her  right hip I can move. Specialty Comments:  No specialty comments available.  Imaging: No results found. X-rays that do accompany her show what I feel is avascular necrosis of both hips.  There is also superimposed osteoarthritis as well.  There is particular osteophytes and joint space narrowing and significant irregularities in both femoral heads.  PMFS History: Patient Active Problem List   Diagnosis Date Noted  . Avascular necrosis of bone of hip, 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  . PTSD (post-traumatic stress disorder)   . COPD (chronic obstructive pulmonary disease) (HCC)   . Bipolar 1 disorder (HCC)   . Anxiety   . B12 deficiency 12/09/2018  . COPD with acute exacerbation (HCC) 04/14/2017  . Hyperglycemia 04/14/2017   Past Medical History:  Diagnosis Date  . Anxiety    . Bipolar 1 disorder (HCC)   . COPD (chronic obstructive pulmonary disease) (HCC)   . PTSD (post-traumatic stress disorder)     Family History  Problem Relation Age of Onset  . CAD Mother 35  . COPD Mother   . Depression Mother   . CAD Father 46  . Hearing loss Father   . Depression Sister   . Heart attack Maternal Grandmother   . Early death Maternal Grandfather   . Hearing loss Paternal Grandfather     Past Surgical History:  Procedure Laterality Date  . bone spurs foot    . HAND SURGERY Left   . TONSILLECTOMY AND ADENOIDECTOMY     Social History   Occupational History  . Occupation: retired  Tobacco Use  . Smoking status: Former Smoker    Packs/day: 0.50    Years: 43.00    Pack years: 21.50    Types: Cigarettes    Last attempt to quit: 10/07/2018    Years since quitting: 0.2  . Smokeless tobacco: Never Used  . Tobacco comment: pack per day 08/05/2018  Substance and Sexual Activity  . Alcohol use: Yes    Comment: occ  . Drug use: No  . Sexual activity: Not on file

## 2019-01-27 ENCOUNTER — Telehealth (INDEPENDENT_AMBULATORY_CARE_PROVIDER_SITE_OTHER): Payer: Self-pay | Admitting: Orthopaedic Surgery

## 2019-01-27 ENCOUNTER — Other Ambulatory Visit (INDEPENDENT_AMBULATORY_CARE_PROVIDER_SITE_OTHER): Payer: Self-pay | Admitting: Orthopaedic Surgery

## 2019-01-27 MED ORDER — ACETAMINOPHEN-CODEINE #3 300-30 MG PO TABS: 1.0000 | ORAL_TABLET | Freq: Three times a day (TID) | ORAL | 0 refills | Status: DC | PRN

## 2019-01-27 NOTE — Telephone Encounter (Signed)
Patient called requesting an RX refill on the Tylenol #3.  Patient also stated that she is barely getting around with the walker and wanted to know if she can take something else or do something else.  She said the pain is in the upper thigh and knees.  [phone].  Thank you.

## 2019-01-27 NOTE — Telephone Encounter (Signed)
I sent in some more pain meds.  There is nothing else she can really do other than use her walker until we can get her scheduled for a hip replacement.

## 2019-01-27 NOTE — Telephone Encounter (Signed)
Please advise 

## 2019-01-28 NOTE — Telephone Encounter (Signed)
Patient aware of the below message  

## 2019-02-09 ENCOUNTER — Telehealth: Payer: Self-pay | Admitting: Orthopaedic Surgery

## 2019-02-09 NOTE — Telephone Encounter (Signed)
Patient called requesting something stronger, Due for hip surgery and in pain. Tylenol 3 is not strong enough for th enough

## 2019-02-09 NOTE — Telephone Encounter (Signed)
IC s/w patient and advised per Dr Blackman.  

## 2019-02-09 NOTE — Telephone Encounter (Signed)
Please advise. Thanks.  

## 2019-02-09 NOTE — Telephone Encounter (Signed)
Unfortunately we cannot put anyone on her knee medication stronger due to the detrimental effect that stronger narcotics will have on the body which will then make pain control for surgery even more difficult.  Also, given the current narcotics loss, we are not allowed to treat arthritic pain with strong narcotics.

## 2019-02-24 ENCOUNTER — Other Ambulatory Visit (INDEPENDENT_AMBULATORY_CARE_PROVIDER_SITE_OTHER): Payer: Self-pay | Admitting: Orthopaedic Surgery

## 2019-02-24 NOTE — Telephone Encounter (Signed)
Ok for refill? 

## 2019-02-26 NOTE — Progress Notes (Signed)
Please place orders in Epic as patient is being scheduled for a pre-op appointment! Thank you! 

## 2019-03-02 ENCOUNTER — Other Ambulatory Visit: Payer: Self-pay | Admitting: Physician Assistant

## 2019-03-09 NOTE — Progress Notes (Signed)
12/11/2018- noted in Epic-EKG  08/14/2018- noted in Epic-CT chest high resolution

## 2019-03-09 NOTE — Patient Instructions (Addendum)
Judy [name]  03/09/2019   Your procedure is scheduled on: Friday 03/19/2019  Report to [facility]              Report to admitting at 0600  AM                YOU NEED TO HAVE A COVID 19 TEST ON Tuesday, June 9,2020 @ 12 noon, THIS TEST MUST BE DONE BEFORE SURGERY, COME TO [facility] ENTRANCE.    Call this number if you have problems the morning of surgery [phone]    Remember: Do not eat food  :After Midnight.               NO SOLID FOOD AFTER MIDNIGHT THE NIGHT PRIOR TO SURGERY. NOTHING BY MOUTH EXCEPT CLEAR LIQUIDS 0430 am.              PLEASE FINISH ENSURE DRINK PER SURGEON ORDER  WHICH NEEDS TO BE COMPLETED AT  0430 am.   CLEAR LIQUID DIET   Foods Allowed                                                                     Foods Excluded  Coffee and tea, regular and decaf                             liquids that you cannot  Plain Jell-O in any flavor                                             see through such as: Fruit ices (not with fruit pulp)                                     milk, soups, orange juice  Iced Popsicles                                    All solid food Carbonated beverages, regular and diet                                    Cranberry, grape and apple juices Sports drinks like Gatorade Lightly seasoned clear broth or consume(fat free) Sugar, honey syrup  Sample Menu Breakfast                                Lunch                                     Supper Cranberry juice  Beef broth                            Chicken broth Jell-O                                     Grape juice                           Apple juice Coffee or tea                        Jell-O                                      Popsicle                                                Coffee or tea                        Coffee or  tea  _____________________________________________________________________    BRUSH YOUR TEETH MORNING OF SURGERY AND RINSE YOUR MOUTH OUT, NO CHEWING GUM CANDY OR MINTS.     Take these medicines the morning of surgery with A SIP OF WATER: Lurasidone (Latuda)                                  You may not have any metal on your body including hair pins and              piercings  Do not wear jewelry, make-up, lotions, powders or perfumes, deodorant             Do not wear nail polish.  Do not shave  48 hours prior to surgery.              Do not bring valuables to the hospital. [facility] IS NOT             RESPONSIBLE   FOR VALUABLES.  Contacts, dentures or bridgework may not be worn into surgery.  Leave suitcase in the car. After surgery it may be brought to your room.                  Please read over the following fact sheets you were given: _____________________________________________________________________             [facility] - Preparing for Surgery Before surgery, you can play an important role.  Because skin is not sterile, your skin needs to be as free of germs as possible.  You can reduce the number of germs on your skin by washing with CHG (chlorahexidine gluconate) soap before surgery.  CHG is an antiseptic cleaner which kills germs and bonds with the skin to continue killing germs even after washing. Please DO NOT use if you have an allergy to CHG or antibacterial soaps.  If your skin becomes reddened/irritated stop using the CHG and inform your nurse when you arrive at Short Stay. Do not shave (including legs and underarms) for at  least 48 hours prior to the first CHG shower.  You may shave your face/neck. Please follow these instructions carefully:  1.  Shower with CHG Soap the night before surgery and the  morning of Surgery.  2.  If you choose to wash your hair, wash your hair first as usual with your  normal  shampoo.  3.  After you shampoo, rinse your hair  and body thoroughly to remove the  shampoo.                           4.  Use CHG as you would any other liquid soap.  You can apply chg directly  to the skin and wash                       Gently with a scrungie or clean washcloth.  5.  Apply the CHG Soap to your body ONLY FROM THE NECK DOWN.   Do not use on face/ open                           Wound or open sores. Avoid contact with eyes, ears mouth and genitals (private parts).                       Wash face,  Genitals (private parts) with your normal soap.             6.  Wash thoroughly, paying special attention to the area where your surgery  will be performed.  7.  Thoroughly rinse your body with warm water from the neck down.  8.  DO NOT shower/wash with your normal soap after using and rinsing off  the CHG Soap.                9.  Pat yourself dry with a clean towel.            10.  Wear clean pajamas.            11.  Place clean sheets on your bed the night of your first shower and do not  sleep with pets. Day of Surgery : Do not apply any lotions/deodorants the morning of surgery.  Please wear clean clothes to the hospital/surgery center.  FAILURE TO FOLLOW THESE INSTRUCTIONS MAY RESULT IN THE CANCELLATION OF YOUR SURGERY PATIENT SIGNATURE_________________________________  NURSE SIGNATURE__________________________________  ________________________________________________________________________   [name]  An incentive spirometer is a tool that can help keep your lungs clear and active. This tool measures how well you are filling your lungs with each breath. Taking long deep breaths may help reverse or decrease the chance of developing breathing (pulmonary) problems (especially infection) following:  A long period of time when you are unable to move or be active. BEFORE THE PROCEDURE   If the spirometer includes an indicator to show your best effort, your nurse or respiratory therapist will set it to a desired  goal.  If possible, sit up straight or lean slightly forward. Try not to slouch.  Hold the incentive spirometer in an upright position. INSTRUCTIONS FOR USE  1. Sit on the edge of your bed if possible, or sit up as far as you can in bed or on a chair. 2. Hold the incentive spirometer in an upright position. 3. Breathe out normally. 4. Place the mouthpiece in your mouth and seal your lips tightly around  it. 5. Breathe in slowly and as deeply as possible, raising the piston or the ball toward the top of the column. 6. Hold your breath for 3-5 seconds or for as long as possible. Allow the piston or ball to fall to the bottom of the column. 7. Remove the mouthpiece from your mouth and breathe out normally. 8. Rest for a few seconds and repeat Steps 1 through 7 at least 10 times every 1-2 hours when you are awake. Take your time and take a few normal breaths between deep breaths. 9. The spirometer may include an indicator to show your best effort. Use the indicator as a goal to work toward during each repetition. 10. After each set of 10 deep breaths, practice coughing to be sure your lungs are clear. If you have an incision (the cut made at the time of surgery), support your incision when coughing by placing a pillow or rolled up towels firmly against it. Once you are able to get out of bed, walk around indoors and cough well. You may stop using the incentive spirometer when instructed by your caregiver.  RISKS AND COMPLICATIONS  Take your time so you do not get dizzy or light-headed.  If you are in pain, you may need to take or ask for pain medication before doing incentive spirometry. It is harder to take a deep breath if you are having pain. AFTER USE  Rest and breathe slowly and easily.  It can be helpful to keep track of a log of your progress. Your caregiver can provide you with a simple table to help with this. If you are using the spirometer at home, follow these instructions: SEEK  MEDICAL CARE IF:   You are having difficultly using the spirometer.  You have trouble using the spirometer as often as instructed.  Your pain medication is not giving enough relief while using the spirometer.  You develop fever of 100.5 F (38.1 C) or higher. SEEK IMMEDIATE MEDICAL CARE IF:   You cough up bloody sputum that had not been present before.  You develop fever of 102 F (38.9 C) or greater.  You develop worsening pain at or near the incision site. MAKE SURE YOU:   Understand these instructions.  Will watch your condition.  Will get help right away if you are not doing well or get worse. Document Released: 02/03/2007 Document Revised: 12/16/2011 Document Reviewed: 04/06/2007 [facility] Patient Information 2014 [employer], [place].   ________________________________________________________________________

## 2019-03-10 ENCOUNTER — Other Ambulatory Visit: Payer: Self-pay

## 2019-03-10 ENCOUNTER — Encounter (HOSPITAL_COMMUNITY): Payer: Self-pay

## 2019-03-10 ENCOUNTER — Encounter (HOSPITAL_COMMUNITY)
Admission: RE | Admit: 2019-03-10 | Discharge: 2019-03-10 | Disposition: A | Discharge status: Home / Self Care | Payer: Medicare Other | Source: Ambulatory Visit | Attending: Orthopaedic Surgery | Admitting: Orthopaedic Surgery

## 2019-03-10 DIAGNOSIS — Z01812 Encounter for preprocedural laboratory examination: Secondary | ICD-10-CM | POA: Insufficient documentation

## 2019-03-10 HISTORY — DX: Idiopathic aseptic necrosis of unspecified bone: M87.00

## 2019-03-10 HISTORY — DX: Unspecified osteoarthritis, unspecified site: M19.90

## 2019-03-10 HISTORY — DX: Pneumonia, unspecified organism: J18.9

## 2019-03-10 LAB — CBC
HCT: 44.2 % (ref 36.0–46.0)
Hemoglobin: 13.9 g/dL (ref 12.0–15.0)
MCH: 29.4 pg (ref 26.0–34.0)
MCHC: 31.4 g/dL (ref 30.0–36.0)
MCV: 93.6 fL (ref 80.0–100.0)
Platelets: 278 10*3/uL (ref 150–400)
RBC: 4.72 MIL/uL (ref 3.87–5.11)
RDW: 12.1 % (ref 11.5–15.5)
WBC: 6.2 10*3/uL (ref 4.0–10.5)
nRBC: 0 % (ref 0.0–0.2)

## 2019-03-10 LAB — SURGICAL PCR SCREEN
MRSA, PCR: NEGATIVE
Staphylococcus aureus: NEGATIVE

## 2019-03-11 ENCOUNTER — Telehealth: Payer: Self-pay | Admitting: Orthopaedic Surgery

## 2019-03-11 NOTE — Telephone Encounter (Signed)
Patient called requesting a return call to let her know what at home care services will be provided after her surgery on 03/19/19.

## 2019-03-11 NOTE — Telephone Encounter (Signed)
Patient states she will have to stay in a facility after surgery because she has noone to stay with her. This is something they do while she is at the hospital correct?

## 2019-03-12 NOTE — Telephone Encounter (Signed)
Correct, she will need to speak the social worker in the hospital after surgery.

## 2019-03-12 NOTE — Telephone Encounter (Signed)
I left voicemail for patient advising. 

## 2019-03-16 ENCOUNTER — Telehealth: Payer: Self-pay | Admitting: Orthopaedic Surgery

## 2019-03-16 ENCOUNTER — Other Ambulatory Visit (HOSPITAL_COMMUNITY)
Admission: RE | Admit: 2019-03-16 | Discharge: 2019-03-16 | Disposition: A | Discharge status: Home / Self Care | Payer: Medicare Other | Source: Ambulatory Visit | Attending: Orthopaedic Surgery | Admitting: Orthopaedic Surgery

## 2019-03-16 ENCOUNTER — Other Ambulatory Visit (HOSPITAL_COMMUNITY): Payer: Medicare Other

## 2019-03-16 DIAGNOSIS — Z1159 Encounter for screening for other viral diseases: Secondary | ICD-10-CM | POA: Insufficient documentation

## 2019-03-16 DIAGNOSIS — Z01812 Encounter for preprocedural laboratory examination: Secondary | ICD-10-CM | POA: Insufficient documentation

## 2019-03-16 NOTE — Telephone Encounter (Signed)
Patient called stated no after care for her surgery was ever discussed with her.  Please call patient @ [phone]

## 2019-03-17 LAB — NOVEL CORONAVIRUS, NAA (HOSP ORDER, SEND-OUT TO REF LAB; TAT 18-24 HRS): SARS-CoV-2, NAA: NOT DETECTED

## 2019-03-17 NOTE — Telephone Encounter (Signed)
LMOM for patient leaving her some care instructions letting her know to call us back if she has anymore questions

## 2019-03-18 NOTE — Progress Notes (Signed)
SPOKE W/  _patient     SCREENING SYMPTOMS OF COVID 19:   COUGH--no  RUNNY NOSE--- no  SORE THROAT---no  NASAL CONGESTION----no  SNEEZING----no  SHORTNESS OF BREATH---no  DIFFICULTY BREATHING---no  TEMP >100.0 -----no  UNEXPLAINED BODY ACHES------no  CHILLS --------no   HEADACHES ---------no  LOSS OF SMELL/ TASTE --------no    HAVE YOU OR ANY FAMILY MEMBER TRAVELLED PAST 14 DAYS OUT OF THE   COUNTY---no STATE----no COUNTRY----no  HAVE YOU OR ANY FAMILY MEMBER BEEN EXPOSED TO ANYONE WITH COVID 19? no    

## 2019-03-18 NOTE — Anesthesia Preprocedure Evaluation (Addendum)
Anesthesia Evaluation  Patient identified by MRN, date of birth, ID band Patient awake    Reviewed: Allergy & Precautions, NPO status , Patient's Chart, lab work & pertinent test results  Airway Mallampati: III  TM Distance: >3 FB Neck ROM: Full    Dental no notable dental hx. (+) Teeth Intact, Dental Advisory Given   Pulmonary COPD, former smoker,    Pulmonary exam normal breath sounds clear to auscultation       Cardiovascular negative cardio ROS Normal cardiovascular exam Rhythm:Regular Rate:Normal     Neuro/Psych PSYCHIATRIC DISORDERS Anxiety Bipolar Disorder negative neurological ROS     GI/Hepatic negative GI ROS, Neg liver ROS,   Endo/Other  negative endocrine ROS  Renal/GU negative Renal ROS  negative genitourinary   Musculoskeletal  (+) Arthritis , Osteoarthritis,    Abdominal   Peds  Hematology negative hematology ROS (+)   Anesthesia Other Findings   Reproductive/Obstetrics                            Anesthesia Physical Anesthesia Plan  ASA: II  Anesthesia Plan: Spinal   Post-op Pain Management:    Induction:   PONV Risk Score and Plan: 2 and Treatment may vary due to age or medical condition, Propofol infusion, Midazolam, Ondansetron and Dexamethasone  Airway Management Planned: Natural Airway  Additional Equipment:   Intra-op Plan:   Post-operative Plan:   Informed Consent: I have reviewed the patients History and Physical, chart, labs and discussed the procedure including the risks, benefits and alternatives for the proposed anesthesia with the patient or authorized representative who has indicated his/her understanding and acceptance.     Dental advisory given  Plan Discussed with: CRNA  Anesthesia Plan Comments:         Anesthesia Quick Evaluation

## 2019-03-19 ENCOUNTER — Ambulatory Visit (HOSPITAL_COMMUNITY): Payer: Medicare Other | Admitting: Anesthesiology

## 2019-03-19 ENCOUNTER — Encounter (HOSPITAL_COMMUNITY): Payer: Self-pay | Admitting: *Deleted

## 2019-03-19 ENCOUNTER — Inpatient Hospital Stay (HOSPITAL_COMMUNITY): Payer: Medicare Other

## 2019-03-19 ENCOUNTER — Other Ambulatory Visit: Payer: Self-pay

## 2019-03-19 ENCOUNTER — Encounter (HOSPITAL_COMMUNITY)
Admission: RE | Disposition: A | Discharge status: Home Health | Payer: Self-pay | Source: Home / Self Care | Attending: Orthopaedic Surgery

## 2019-03-19 ENCOUNTER — Ambulatory Visit (HOSPITAL_COMMUNITY): Payer: Medicare Other | Admitting: Physician Assistant

## 2019-03-19 ENCOUNTER — Ambulatory Visit (HOSPITAL_COMMUNITY): Payer: Medicare Other

## 2019-03-19 ENCOUNTER — Inpatient Hospital Stay (HOSPITAL_COMMUNITY)
Admission: RE | Admit: 2019-03-19 | Discharge: 2019-03-22 | DRG: 470 | Disposition: A | Discharge status: Home Health | Payer: Medicare Other | Attending: Orthopaedic Surgery | Admitting: Orthopaedic Surgery

## 2019-03-19 DIAGNOSIS — J449 Chronic obstructive pulmonary disease, unspecified: Secondary | ICD-10-CM | POA: Diagnosis not present

## 2019-03-19 DIAGNOSIS — Z818 Family history of other mental and behavioral disorders: Secondary | ICD-10-CM

## 2019-03-19 DIAGNOSIS — F4310 Post-traumatic stress disorder, unspecified: Secondary | ICD-10-CM | POA: Diagnosis present

## 2019-03-19 DIAGNOSIS — Z96642 Presence of left artificial hip joint: Secondary | ICD-10-CM

## 2019-03-19 DIAGNOSIS — Z87891 Personal history of nicotine dependence: Secondary | ICD-10-CM | POA: Diagnosis not present

## 2019-03-19 DIAGNOSIS — Z8701 Personal history of pneumonia (recurrent): Secondary | ICD-10-CM | POA: Diagnosis not present

## 2019-03-19 DIAGNOSIS — M25252 Flail joint, left hip: Secondary | ICD-10-CM | POA: Diagnosis not present

## 2019-03-19 DIAGNOSIS — Z825 Family history of asthma and other chronic lower respiratory diseases: Secondary | ICD-10-CM | POA: Diagnosis not present

## 2019-03-19 DIAGNOSIS — M25452 Effusion, left hip: Secondary | ICD-10-CM | POA: Diagnosis not present

## 2019-03-19 DIAGNOSIS — F319 Bipolar disorder, unspecified: Secondary | ICD-10-CM | POA: Diagnosis present

## 2019-03-19 DIAGNOSIS — M879 Osteonecrosis, unspecified: Secondary | ICD-10-CM | POA: Diagnosis not present

## 2019-03-19 DIAGNOSIS — Z419 Encounter for procedure for purposes other than remedying health state, unspecified: Secondary | ICD-10-CM

## 2019-03-19 DIAGNOSIS — Z20828 Contact with and (suspected) exposure to other viral communicable diseases: Secondary | ICD-10-CM | POA: Diagnosis present

## 2019-03-19 DIAGNOSIS — Z9181 History of falling: Secondary | ICD-10-CM

## 2019-03-19 DIAGNOSIS — M24052 Loose body in left hip: Secondary | ICD-10-CM | POA: Diagnosis present

## 2019-03-19 DIAGNOSIS — M8588 Other specified disorders of bone density and structure, other site: Secondary | ICD-10-CM | POA: Diagnosis present

## 2019-03-19 DIAGNOSIS — M87052 Idiopathic aseptic necrosis of left femur: Secondary | ICD-10-CM | POA: Diagnosis present

## 2019-03-19 DIAGNOSIS — M1612 Unilateral primary osteoarthritis, left hip: Secondary | ICD-10-CM | POA: Diagnosis not present

## 2019-03-19 HISTORY — PX: TOTAL HIP ARTHROPLASTY: SHX124

## 2019-03-19 LAB — BASIC METABOLIC PANEL
Anion gap: 11 (ref 5–15)
BUN: 12 mg/dL (ref 8–23)
CO2: 23 mmol/L (ref 22–32)
Calcium: 9.3 mg/dL (ref 8.9–10.3)
Chloride: 105 mmol/L (ref 98–111)
Creatinine, Ser: 0.88 mg/dL (ref 0.44–1.00)
GFR calc Af Amer: >60 mL/min (ref >60)
GFR calc non Af Amer: >60 mL/min (ref >60)
Glucose, Bld: 95 mg/dL (ref 70–99)
Potassium: 3.4 mmol/L — ABNORMAL LOW (ref 3.5–5.1)
Sodium: 139 mmol/L (ref 135–145)

## 2019-03-19 IMAGING — DX PORTABLE PELVIS 1-2 VIEWS
1 series · 1 of 1 positions shown · non-contrast
Comparison: Intraoperative imaging earlier today.

CLINICAL DATA: Status post left hip replacement today.

EXAM:
PORTABLE PELVIS 1-2 VIEWS

[pelvis ap]
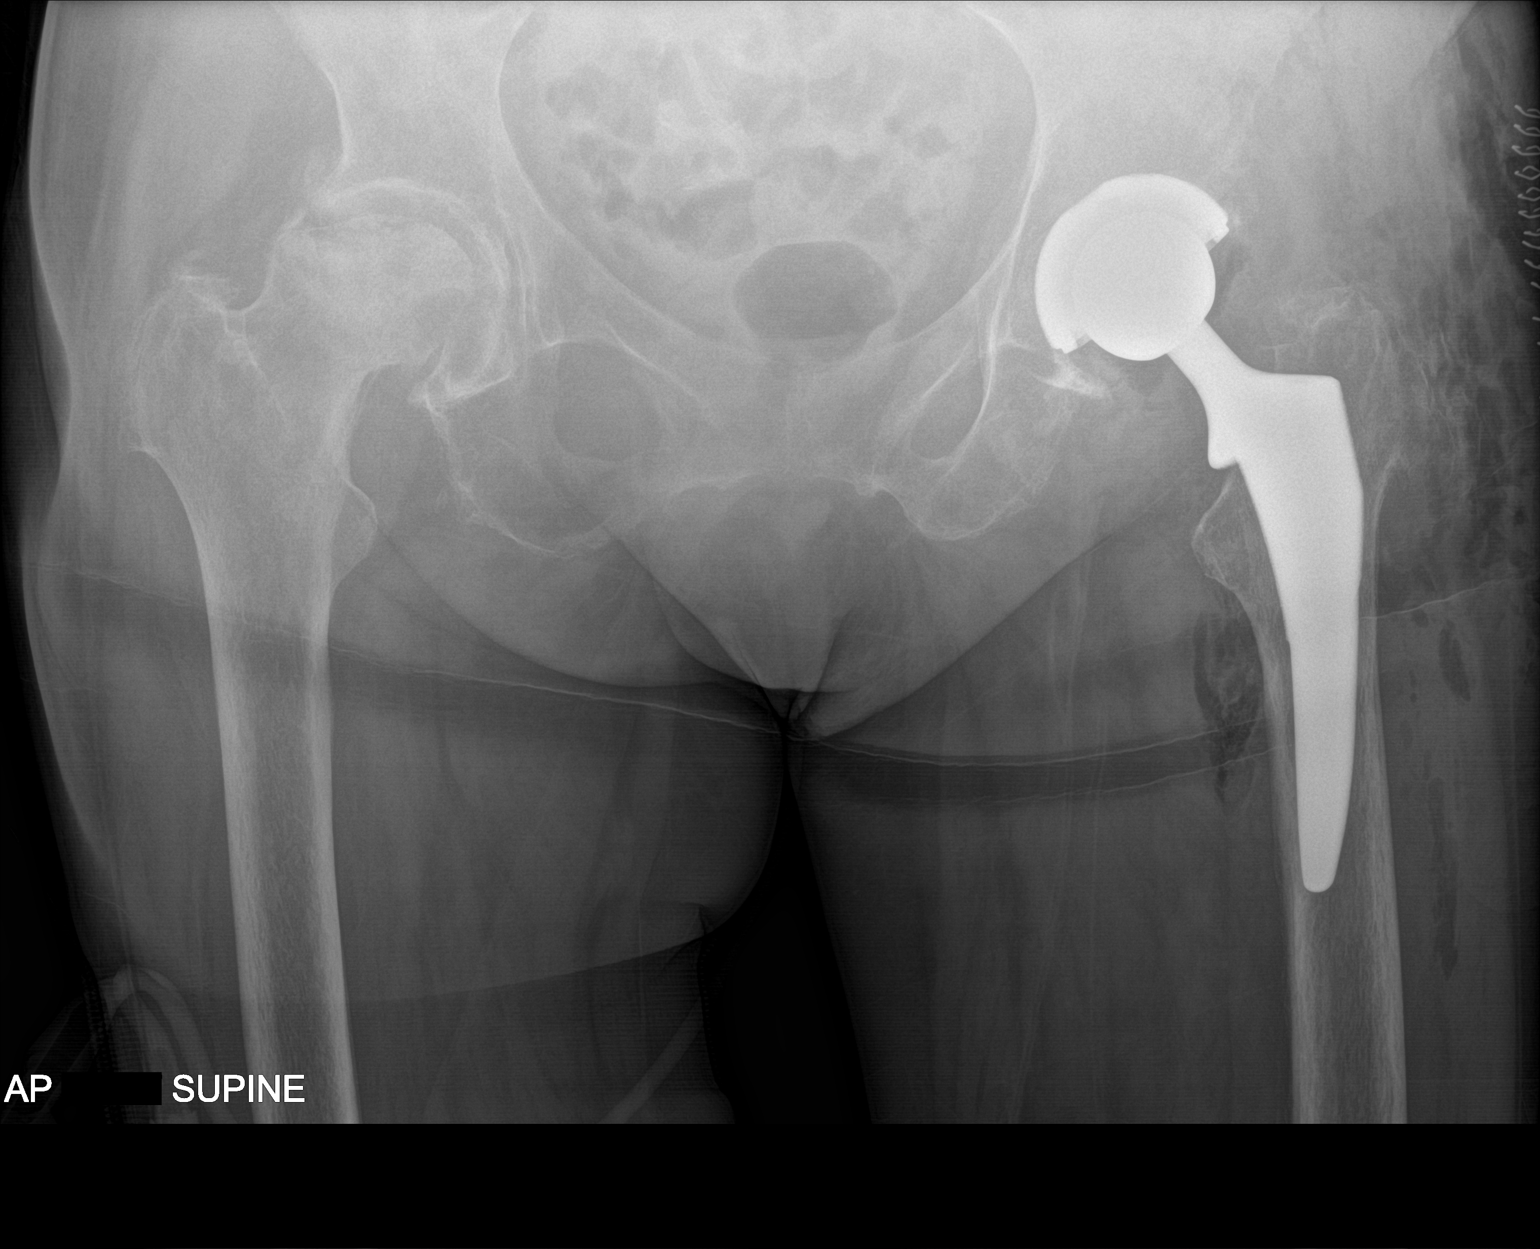

[1 of 1 positions shown; findings below may reference images not displayed]

FINDINGS: Left total hip arthroplasty is in place. Surgical staples and gas in
the soft tissues noted. The device is located. No fracture.
Avascular necrosis of the right femoral head with flattening and
sclerosis is identified.
IMPRESSION: Status post left hip replacement.  No acute finding.

Advanced avascular necrosis right femoral head.

## 2019-03-19 SURGERY — ARTHROPLASTY, HIP, TOTAL, ANTERIOR APPROACH
Anesthesia: Spinal | Site: Hip | Laterality: Left

## 2019-03-19 MED ORDER — ONDANSETRON HCL 4 MG/2ML IJ SOLN
INTRAMUSCULAR | Status: DC | PRN
  Administered 2019-03-19: 4 mg via INTRAVENOUS

## 2019-03-19 MED ORDER — MENTHOL 3 MG MT LOZG: 1.0000 | LOZENGE | OROMUCOSAL | Status: DC | PRN

## 2019-03-19 MED ORDER — CEFAZOLIN SODIUM-DEXTROSE 2-4 GM/100ML-% IV SOLN
2.0000 g | INTRAVENOUS | Status: AC
  Administered 2019-03-19: 2 g via INTRAVENOUS
  Filled 2019-03-19: qty 100

## 2019-03-19 MED ORDER — LACTATED RINGERS IV SOLN
INTRAVENOUS | Status: DC
  Administered 2019-03-19 (×2): via INTRAVENOUS

## 2019-03-19 MED ORDER — LORAZEPAM 1 MG PO TABS
1.5000 mg | ORAL_TABLET | Freq: Every day | ORAL | Status: DC
  Administered 2019-03-19 – 2019-03-21 (×3): 1.5 mg via ORAL
  Filled 2019-03-19 (×3): qty 1

## 2019-03-19 MED ORDER — FENTANYL CITRATE (PF) 100 MCG/2ML IJ SOLN
25.0000 ug | INTRAMUSCULAR | Status: DC | PRN
  Administered 2019-03-19 (×2): 50 ug via INTRAVENOUS

## 2019-03-19 MED ORDER — OXYCODONE HCL 5 MG PO TABS
10.0000 mg | ORAL_TABLET | ORAL | Status: DC | PRN
  Administered 2019-03-19 – 2019-03-20 (×3): 10 mg via ORAL
  Filled 2019-03-19 (×2): qty 2

## 2019-03-19 MED ORDER — METOCLOPRAMIDE HCL 5 MG PO TABS: 5.0000 mg | ORAL_TABLET | Freq: Three times a day (TID) | ORAL | Status: DC | PRN

## 2019-03-19 MED ORDER — EPHEDRINE SULFATE-NACL 50-0.9 MG/10ML-% IV SOSY
PREFILLED_SYRINGE | INTRAVENOUS | Status: DC | PRN
  Administered 2019-03-19 (×5): 10 mg via INTRAVENOUS

## 2019-03-19 MED ORDER — FENTANYL CITRATE (PF) 100 MCG/2ML IJ SOLN
50.0000 ug | INTRAMUSCULAR | Status: AC
  Administered 2019-03-19: 50 ug via INTRAVENOUS

## 2019-03-19 MED ORDER — SODIUM CHLORIDE 0.9 % IV SOLN
INTRAVENOUS | Status: DC | PRN
  Administered 2019-03-19: 25 ug/min via INTRAVENOUS

## 2019-03-19 MED ORDER — MIDAZOLAM HCL 2 MG/2ML IJ SOLN
INTRAMUSCULAR | Status: AC
  Filled 2019-03-19: qty 2

## 2019-03-19 MED ORDER — EPHEDRINE 5 MG/ML INJ
INTRAVENOUS | Status: AC
  Filled 2019-03-19: qty 10

## 2019-03-19 MED ORDER — LIDOCAINE HCL (CARDIAC) PF 100 MG/5ML IV SOSY
PREFILLED_SYRINGE | INTRAVENOUS | Status: DC | PRN
  Administered 2019-03-19: 80 mg via INTRAVENOUS

## 2019-03-19 MED ORDER — ONDANSETRON HCL 4 MG/2ML IJ SOLN
INTRAMUSCULAR | Status: AC
  Filled 2019-03-19: qty 2

## 2019-03-19 MED ORDER — CEFAZOLIN SODIUM-DEXTROSE 1-4 GM/50ML-% IV SOLN
1.0000 g | Freq: Four times a day (QID) | INTRAVENOUS | Status: AC
  Administered 2019-03-19 (×2): 1 g via INTRAVENOUS
  Filled 2019-03-19 (×2): qty 50

## 2019-03-19 MED ORDER — POVIDONE-IODINE 10 % EX SWAB: 2.0000 "application " | Freq: Once | CUTANEOUS | Status: DC

## 2019-03-19 MED ORDER — FENTANYL CITRATE (PF) 100 MCG/2ML IJ SOLN
INTRAMUSCULAR | Status: DC | PRN
  Administered 2019-03-19: 100 ug via INTRAVENOUS

## 2019-03-19 MED ORDER — DIPHENHYDRAMINE HCL 12.5 MG/5ML PO ELIX: 12.5000 mg | ORAL_SOLUTION | ORAL | Status: DC | PRN

## 2019-03-19 MED ORDER — PHENYLEPHRINE 40 MCG/ML (10ML) SYRINGE FOR IV PUSH (FOR BLOOD PRESSURE SUPPORT)
PREFILLED_SYRINGE | INTRAVENOUS | Status: AC
  Filled 2019-03-19: qty 20

## 2019-03-19 MED ORDER — TRANEXAMIC ACID-NACL 1000-0.7 MG/100ML-% IV SOLN
1000.0000 mg | INTRAVENOUS | Status: AC
  Administered 2019-03-19: 1000 mg via INTRAVENOUS
  Filled 2019-03-19: qty 100

## 2019-03-19 MED ORDER — PHENYLEPHRINE 40 MCG/ML (10ML) SYRINGE FOR IV PUSH (FOR BLOOD PRESSURE SUPPORT)
PREFILLED_SYRINGE | INTRAVENOUS | Status: DC | PRN
  Administered 2019-03-19 (×6): 80 ug via INTRAVENOUS

## 2019-03-19 MED ORDER — FENTANYL CITRATE (PF) 100 MCG/2ML IJ SOLN
INTRAMUSCULAR | Status: AC
  Filled 2019-03-19: qty 2

## 2019-03-19 MED ORDER — METOCLOPRAMIDE HCL 5 MG/ML IJ SOLN: 5.0000 mg | Freq: Three times a day (TID) | INTRAMUSCULAR | Status: DC | PRN

## 2019-03-19 MED ORDER — PHENYLEPHRINE 40 MCG/ML (10ML) SYRINGE FOR IV PUSH (FOR BLOOD PRESSURE SUPPORT)
PREFILLED_SYRINGE | INTRAVENOUS | Status: AC
  Filled 2019-03-19: qty 10

## 2019-03-19 MED ORDER — 0.9 % SODIUM CHLORIDE (POUR BTL) OPTIME
TOPICAL | Status: DC | PRN
  Administered 2019-03-19: 08:00:00 1000 mL

## 2019-03-19 MED ORDER — PROPOFOL 10 MG/ML IV BOLUS
INTRAVENOUS | Status: AC
  Filled 2019-03-19: qty 60

## 2019-03-19 MED ORDER — QUETIAPINE FUMARATE 300 MG PO TABS
800.0000 mg | ORAL_TABLET | Freq: Every day | ORAL | Status: DC
  Administered 2019-03-19 – 2019-03-21 (×3): 800 mg via ORAL
  Filled 2019-03-19 (×3): qty 1

## 2019-03-19 MED ORDER — DOCUSATE SODIUM 100 MG PO CAPS
100.0000 mg | ORAL_CAPSULE | Freq: Two times a day (BID) | ORAL | Status: DC
  Administered 2019-03-19 – 2019-03-22 (×6): 100 mg via ORAL
  Filled 2019-03-19 (×6): qty 1

## 2019-03-19 MED ORDER — ONDANSETRON HCL 4 MG PO TABS: 4.0000 mg | ORAL_TABLET | Freq: Four times a day (QID) | ORAL | Status: DC | PRN

## 2019-03-19 MED ORDER — LURASIDONE HCL 40 MG PO TABS
80.0000 mg | ORAL_TABLET | Freq: Every evening | ORAL | Status: DC
  Administered 2019-03-19 – 2019-03-21 (×3): 80 mg via ORAL
  Filled 2019-03-19 (×4): qty 2

## 2019-03-19 MED ORDER — PHENOL 1.4 % MT LIQD: 1.0000 | OROMUCOSAL | Status: DC | PRN

## 2019-03-19 MED ORDER — OXYCODONE HCL 5 MG PO TABS
5.0000 mg | ORAL_TABLET | ORAL | Status: DC | PRN
  Administered 2019-03-19 (×2): 10 mg via ORAL
  Administered 2019-03-21 – 2019-03-22 (×3): 5 mg via ORAL
  Filled 2019-03-19 (×2): qty 2
  Filled 2019-03-19 (×2): qty 1
  Filled 2019-03-19 (×2): qty 2

## 2019-03-19 MED ORDER — SODIUM CHLORIDE 0.9 % IV SOLN
INTRAVENOUS | Status: DC
  Administered 2019-03-19: 75 mL/h via INTRAVENOUS
  Administered 2019-03-20: 03:00:00 via INTRAVENOUS

## 2019-03-19 MED ORDER — ASPIRIN 81 MG PO CHEW
81.0000 mg | CHEWABLE_TABLET | Freq: Two times a day (BID) | ORAL | Status: DC
  Administered 2019-03-19 – 2019-03-22 (×6): 81 mg via ORAL
  Filled 2019-03-19 (×6): qty 1

## 2019-03-19 MED ORDER — PROPOFOL 500 MG/50ML IV EMUL
INTRAVENOUS | Status: DC | PRN
  Administered 2019-03-19: 100 ug/kg/min via INTRAVENOUS

## 2019-03-19 MED ORDER — ALUM & MAG HYDROXIDE-SIMETH 200-200-20 MG/5ML PO SUSP: 30.0000 mL | ORAL | Status: DC | PRN

## 2019-03-19 MED ORDER — BUPIVACAINE IN DEXTROSE 0.75-8.25 % IT SOLN
INTRATHECAL | Status: DC | PRN
  Administered 2019-03-19: 1.8 mL via INTRATHECAL

## 2019-03-19 MED ORDER — ACETAMINOPHEN 500 MG PO TABS
1000.0000 mg | ORAL_TABLET | Freq: Once | ORAL | Status: AC
  Administered 2019-03-19: 1000 mg via ORAL
  Filled 2019-03-19: qty 2

## 2019-03-19 MED ORDER — CARIPRAZINE HCL 3 MG PO CAPS
3.0000 mg | ORAL_CAPSULE | Freq: Every evening | ORAL | Status: DC
  Administered 2019-03-19 – 2019-03-21 (×3): 3 mg via ORAL
  Filled 2019-03-19 (×5): qty 1

## 2019-03-19 MED ORDER — LIDOCAINE 2% (20 MG/ML) 5 ML SYRINGE
INTRAMUSCULAR | Status: AC
  Filled 2019-03-19: qty 5

## 2019-03-19 MED ORDER — ONDANSETRON HCL 4 MG/2ML IJ SOLN: 4.0000 mg | Freq: Four times a day (QID) | INTRAMUSCULAR | Status: DC | PRN

## 2019-03-19 MED ORDER — POLYETHYLENE GLYCOL 3350 17 G PO PACK: 17.0000 g | PACK | Freq: Every day | ORAL | Status: DC | PRN

## 2019-03-19 MED ORDER — DEXAMETHASONE SODIUM PHOSPHATE 10 MG/ML IJ SOLN
INTRAMUSCULAR | Status: AC
  Filled 2019-03-19: qty 1

## 2019-03-19 MED ORDER — STERILE WATER FOR IRRIGATION IR SOLN
Status: DC | PRN
  Administered 2019-03-19 (×2): 1000 mL

## 2019-03-19 MED ORDER — FENTANYL CITRATE (PF) 100 MCG/2ML IJ SOLN: INTRAMUSCULAR | Status: DC | PRN

## 2019-03-19 MED ORDER — PANTOPRAZOLE SODIUM 40 MG PO TBEC
40.0000 mg | DELAYED_RELEASE_TABLET | Freq: Every day | ORAL | Status: DC
  Administered 2019-03-19 – 2019-03-22 (×4): 40 mg via ORAL
  Filled 2019-03-19 (×4): qty 1

## 2019-03-19 MED ORDER — MIDAZOLAM HCL 5 MG/5ML IJ SOLN
INTRAMUSCULAR | Status: DC | PRN
  Administered 2019-03-19: 2 mg via INTRAVENOUS

## 2019-03-19 MED ORDER — PROPOFOL 10 MG/ML IV BOLUS
INTRAVENOUS | Status: DC | PRN
  Administered 2019-03-19: 20 mg via INTRAVENOUS
  Administered 2019-03-19: 30 mg via INTRAVENOUS

## 2019-03-19 MED ORDER — HYDROMORPHONE HCL 1 MG/ML IJ SOLN: 0.5000 mg | INTRAMUSCULAR | Status: DC | PRN

## 2019-03-19 MED ORDER — CHLORHEXIDINE GLUCONATE 4 % EX LIQD: 60.0000 mL | Freq: Once | CUTANEOUS | Status: DC

## 2019-03-19 MED ORDER — SODIUM CHLORIDE 0.9 % IR SOLN
Status: DC | PRN
  Administered 2019-03-19: 1000 mL

## 2019-03-19 MED ORDER — DEXAMETHASONE SODIUM PHOSPHATE 10 MG/ML IJ SOLN
INTRAMUSCULAR | Status: DC | PRN
  Administered 2019-03-19: 10 mg via INTRAVENOUS

## 2019-03-19 MED ORDER — ACETAMINOPHEN 325 MG PO TABS
325.0000 mg | ORAL_TABLET | Freq: Four times a day (QID) | ORAL | Status: DC | PRN
  Administered 2019-03-21: 650 mg via ORAL
  Filled 2019-03-19: qty 2

## 2019-03-19 MED ORDER — METHOCARBAMOL 500 MG PO TABS
500.0000 mg | ORAL_TABLET | Freq: Four times a day (QID) | ORAL | Status: DC | PRN
  Administered 2019-03-19 – 2019-03-21 (×6): 500 mg via ORAL
  Filled 2019-03-19 (×6): qty 1

## 2019-03-19 MED ORDER — METHOCARBAMOL 500 MG IVPB - SIMPLE MED
500.0000 mg | Freq: Four times a day (QID) | INTRAVENOUS | Status: DC | PRN
  Filled 2019-03-19: qty 50

## 2019-03-19 SURGICAL SUPPLY — 40 items
ACETAB CUP W/GRIPTION 54 (Plate) ×2 IMPLANT
ARTICULEZE HEAD 36 12 (Hips) ×2 IMPLANT
BAG ZIPLOCK 12X15 (MISCELLANEOUS) IMPLANT
BENZOIN TINCTURE PRP APPL 2/3 (GAUZE/BANDAGES/DRESSINGS) IMPLANT
BLADE SAW SGTL 18X1.27X75 (BLADE) ×2 IMPLANT
BLADE SURG SZ10 CARB STEEL (BLADE) ×4 IMPLANT
COVER PERINEAL POST (MISCELLANEOUS) ×2 IMPLANT
COVER SURGICAL LIGHT HANDLE (MISCELLANEOUS) ×2 IMPLANT
COVER WAND RF STERILE (DRAPES) IMPLANT
CUP ACETAB W/GRIPTION 54 (Plate) ×1 IMPLANT
DRAPE STERI IOBAN 125X83 (DRAPES) ×2 IMPLANT
DRAPE U-SHAPE 47X51 STRL (DRAPES) ×4 IMPLANT
DRSG AQUACEL AG ADV 3.5X10 (GAUZE/BANDAGES/DRESSINGS) ×2 IMPLANT
DURAPREP 26ML APPLICATOR (WOUND CARE) ×2 IMPLANT
ELECT REM PT RETURN 15FT ADLT (MISCELLANEOUS) ×2 IMPLANT
GAUZE XEROFORM 1X8 LF (GAUZE/BANDAGES/DRESSINGS) ×2 IMPLANT
GLOVE BIO SURGEON STRL SZ7.5 (GLOVE) ×2 IMPLANT
GLOVE BIOGEL PI IND STRL 8 (GLOVE) ×2 IMPLANT
GLOVE BIOGEL PI INDICATOR 8 (GLOVE) ×2
GLOVE ECLIPSE 8.0 STRL XLNG CF (GLOVE) ×2 IMPLANT
GOWN STRL REUS W/TWL XL LVL3 (GOWN DISPOSABLE) ×4 IMPLANT
HANDPIECE INTERPULSE COAX TIP (DISPOSABLE) ×1
HEAD ARTICULEZE 36 12 (Hips) ×1 IMPLANT
HOLDER FOLEY CATH W/STRAP (MISCELLANEOUS) ×2 IMPLANT
KIT TURNOVER KIT A (KITS) IMPLANT
LINER NEUTRAL 36ID 54OD (Liner) ×2 IMPLANT
PACK ANTERIOR HIP CUSTOM (KITS) ×2 IMPLANT
SET HNDPC FAN SPRY TIP SCT (DISPOSABLE) ×1 IMPLANT
STAPLER VISISTAT 35W (STAPLE) ×2 IMPLANT
STEM FEM ACTIS HIGH SZ8 (Stem) ×2 IMPLANT
STRIP CLOSURE SKIN 1/2X4 (GAUZE/BANDAGES/DRESSINGS) IMPLANT
SUT ETHIBOND NAB CT1 #1 30IN (SUTURE) ×2 IMPLANT
SUT ETHILON 2 0 PS N (SUTURE) IMPLANT
SUT MNCRL AB 4-0 PS2 18 (SUTURE) IMPLANT
SUT VIC AB 0 CT1 36 (SUTURE) ×2 IMPLANT
SUT VIC AB 1 CT1 36 (SUTURE) ×2 IMPLANT
SUT VIC AB 2-0 CT1 27 (SUTURE) ×2
SUT VIC AB 2-0 CT1 TAPERPNT 27 (SUTURE) ×2 IMPLANT
TRAY FOLEY MTR SLVR 16FR STAT (SET/KITS/TRAYS/PACK) ×2 IMPLANT
YANKAUER SUCT BULB TIP 10FT TU (MISCELLANEOUS) ×2 IMPLANT

## 2019-03-19 NOTE — Anesthesia Procedure Notes (Signed)
Spinal  Patient location during procedure: OR End time: 03/19/2019 8:51 AM Staffing Resident/CRNA: Caryl Pina T, CRNA Performed: resident/CRNA  Preanesthetic Checklist Completed: patient identified, site marked, surgical consent, pre-op evaluation, timeout performed, IV checked, risks and benefits discussed and monitors and equipment checked Spinal Block Patient position: sitting Prep: DuraPrep Patient monitoring: heart rate, cardiac monitor, continuous pulse ox and blood pressure Approach: midline Location: L3-4 Injection technique: single-shot Needle Needle type: Sprotte and Pencan  Needle gauge: 24 G Needle length: 9 cm Assessment Sensory level: T4 Additional Notes Expiration date of kit checked and confirmed. Patient tolerated procedure well, without complications.

## 2019-03-19 NOTE — Op Note (Signed)
NAME: Judy [name], Judy A. MEDICAL RECORD [id number] ACCOUNT [id number] DATE OF BIRTH:03-Apr-1951 FACILITY: [facility] LOCATION: [facility] PHYSICIAN:CHRISTOPHER Aretha ParrotY. BLACKMAN, MD  OPERATIVE REPORT  DATE OF PROCEDURE:  03/19/2019  PREOPERATIVE DIAGNOSES:  Avascular necrosis with combined osteoarthritis, left hip with femoral head collapse.  POSTOPERATIVE DIAGNOSES:  Avascular necrosis with combined osteoarthritis, left hip with femoral head collapse.  PROCEDURE:  Left total hip arthroplasty through direct anterior approach.  IMPLANTS:  DePuy Sector Gription acetabular component size 54, size 36+0 neutral polyethylene liner, size 8 Actis high-offset femoral component, size 36+12 metal hip ball.  SURGEON:  Vanita PandaChristopher Y. Magnus IvanBlackman, MD  ASSISTANT:  Richardean CanalGilbert Clark, PA-C  ANESTHESIA:  Spinal.  ANTIBIOTICS:  Two grams IV Ancef.  ESTIMATED BLOOD LOSS:  200 mL.  COMPLICATIONS:  None.  INDICATIONS:  The patient was a 68 year old female well known to me.  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  She has been experiencing some femoral head collapse, now on the left side.  There is a superimposed osteoarthritis as well.  Her  left leg is shorter than the right side.  Her pain is daily and it has detrimentally affected her mobility, her quality of life and activities of daily living.  She has a significant Trendelenburg gait as well.  At this point, we have recommended total  hip arthroplasty given the severity of the disease in her hips.  We have elected to proceed with the left one first because that is the more painful hip tumor with significant deformity.  We talked about the risk of acute blood loss anemia, nerve and  vessel injury, fracture, infection, DVT, dislocation, and implant failure.  We talked about the goals of decreased pain, improved mobility and overall improved quality of life.  DESCRIPTION OF PROCEDURE:  After informed consent was obtained and appropriate left hip was  marked. she was brought to the operating room and sat up on a stretcher where spinal anesthesia was then obtained.  We laid her supine on the stretcher.  A Foley  catheter was placed.  I assessed her leg lengths again with her completely supine and did find her left leg is shorter than the right.  Traction boots were then placed on both feet, and I placed her supine on the Hana fracture table, the perineal post in  place and both legs in in-line skeletal traction device and no traction applied.  Her left operative hip was prepped and draped with DuraPrep and sterile drapes.  A time-out was called.  She was identified as correct patient, correct left hip.  I then  made an incision just inferior and posterior to the anterior superior iliac spine and carried this obliquely down the leg.  We dissected down tensor fascia lata muscle.  Tensor fascia was then divided longitudinally to proceed with direct anterior  approach to the hip.  We identified and cauterized circumflex vessels.  I then identified the hip capsule, opened the hip capsule in an L-type format, finding a moderate joint effusion and significant loose bodies and flaking cartilage all around her  left hip joint.  We placed curved retractors around the medial and lateral femoral neck and then made our femoral neck cut with an oscillating saw just proximal to the lesser trochanter and completed this with an osteotome.  We placed a corkscrew guide  in the femoral head and removed the femoral head in its entirety and found wide areas of osteonecrosis and osteoarthritis.  I then placed a bent Hohmann over the medial acetabular rim  and then began reaming in stepwise increments from a size 44 reamer  all the way up to a size 53 with all reamers under direct visualization, the last reamer under direct fluoroscopy as well so we could obtain our depth of reaming, our inclination and anteversion.  I then placed the real DePuy Sector Gription acetabular   component size 54 and a 36+0 neutral polyethylene liner for that size acetabular component.  Attention was then turned to the femur.  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  Once we brought her leg down and under and externally rotated to 120  degrees, we placed a Mueller retractor medially and a Hohmann retractor behind the greater trochanter.  We released the lateral joint capsule and used a box-cutting osteotome to enter the femoral canal and a rongeur to lateralize.  We then began  broaching using the Actis broaching system from a size zero all the way up to a size 8 due to her osteopenic bone.  With the size 8 in place, we tried a high-offset femoral neck, and we went with a 36+5 hip ball based on her anatomy.  We reduced this in  the acetabulum, and surprisingly, she was still not stable at all.  We dislocated the hip and removed the trial components.  We placed the real high offset Actis femoral component size 8, and we went with a 36+12 metal hip ball.  We reduced this in the  acetabulum.  We had actually now increased her offset leg length, and she had good range of motion and stability.  We then irrigated the soft tissue with normal saline solution using pulsatile lavage.  We closed the joint capsule with interrupted #1  Ethibond suture, followed by running #1 Vicryl to close the tensor fascia, 0 Vicryl to close the deep tissue, 2-0 Vicryl, subcutaneous tissue, and interrupted staples were used to close the skin.  Xeroform and Aquacel dressing were applied.  She was  taken off the Hana table and taken to recovery room in stable condition.  All final counts were correct.  There were no complications noted.  Of note, Benita Stabile, PA-C, assisted the entire case.  His assistance was crucial for facilitating all aspects of  this case.  LN/NUANCE  D:03/19/2019 T:03/19/2019 JOB:[id number]

## 2019-03-19 NOTE — Transfer of Care (Signed)
Immediate Anesthesia Transfer of Care Note  Patient: RHAPSODY WOLVEN  Procedure(s) Performed: LEFT TOTAL HIP ARTHROPLASTY ANTERIOR APPROACH (Left Hip)  Patient Location: PACU  Anesthesia Type:MAC and Spinal  Level of Consciousness: awake, alert , oriented and patient cooperative  Airway & Oxygen Therapy: Patient Spontanous Breathing and Patient connected to face mask oxygen  Post-op Assessment: Report given to RN, Post -op Vital signs reviewed and stable and Patient moving all extremities  Post vital signs: Reviewed and stable  Last Vitals:  Vitals Value Taken Time  BP    Temp    Pulse 66 03/19/19 1034  Resp 16 03/19/19 1034  SpO2 94 % 03/19/19 1034  Vitals shown include unvalidated device data.  Last Pain:  Vitals:   03/19/19 0706  TempSrc:   PainSc: 9       Patients Stated Pain Goal: 6 (62/03/55 9741)  Complications: No apparent anesthesia complications

## 2019-03-19 NOTE — Anesthesia Postprocedure Evaluation (Signed)
Anesthesia Post Note  [name]  Procedure(s) Performed: LEFT TOTAL HIP ARTHROPLASTY ANTERIOR APPROACH (Left Hip)     Patient location during evaluation: PACU Anesthesia Type: Spinal Level of consciousness: oriented and awake and alert Pain management: pain level controlled Vital Signs Assessment: post-procedure vital signs reviewed and stable Respiratory status: spontaneous breathing, respiratory function stable and patient connected to nasal cannula oxygen Cardiovascular status: blood pressure returned to baseline and stable Postop Assessment: no headache, no backache and no apparent nausea or vomiting Anesthetic complications: no    Last Vitals:  Vitals:   03/19/19 1050 03/19/19 1100  BP:    Pulse: 65 72  Resp: 16 (!) 22  Temp:    SpO2: 92% 95%    Last Pain:  Vitals:   03/19/19 1035  TempSrc:   PainSc: Asleep                 Elaria Osias L Lindley Hiney

## 2019-03-19 NOTE — Brief Op Note (Signed)
03/19/2019  10:11 AM  PATIENT:  Judy [name]  68 y.o. female  PRE-OPERATIVE DIAGNOSIS:  avascular necrosis left hip  POST-OPERATIVE DIAGNOSIS:  avascular necrosis left hip  PROCEDURE:  Procedure(s): LEFT TOTAL HIP ARTHROPLASTY ANTERIOR APPROACH (Left)  SURGEON:  Surgeon(s) and Role:    Mcarthur Rossetti, MD - Primary  PHYSICIAN ASSISTANT: Benita Stabile, PA-C  ANESTHESIA:   spinal  EBL:  200 mL   COUNTS:  YES  DICTATION: .Other Dictation: Dictation Number [id number]  PLAN OF CARE: Admit to inpatient   PATIENT DISPOSITION:  PACU - hemodynamically stable.   Delay start of Pharmacological VTE agent (>24hrs) due to surgical blood loss or risk of bleeding: no

## 2019-03-19 NOTE — H&P (Signed)
TOTAL HIP ADMISSION H&P  Patient is admitted for left total hip arthroplasty.  Subjective:  Chief Complaint: left hip pain  HPI: Judy [name], 68 y.o. female, has a history of pain and functional disability in the left hip(s) due to avascular necrosis and patient has failed non-surgical conservative treatments for greater than 12 weeks to include NSAID's and/or analgesics, corticosteriod injections, flexibility and strengthening excercises, use of assistive devices and activity modification.  Onset of symptoms was abrupt starting 2 years ago with rapidlly worsening course since that time.The patient noted no past surgery on the left hip(s).  Patient currently rates pain in the left hip at 10 out of 10 with activity. Patient has night pain, worsening of pain with activity and weight bearing, trendelenberg gait, pain that interfers with activities of daily living and pain with passive range of motion. Patient has evidence of subchondral cysts and joint space narrowing by imaging studies. This condition presents safety issues increasing the risk of falls.  There is no current active infection.  Patient Active Problem List   Diagnosis Date Noted  . Avascular necrosis of bone of hip, 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  . PTSD (post-traumatic stress disorder)   . COPD (chronic obstructive pulmonary disease) (HCC)   . Bipolar 1 disorder (HCC)   . Anxiety   . B12 deficiency 12/09/2018  . COPD with acute exacerbation (HCC) 04/14/2017  . Hyperglycemia 04/14/2017   Past Medical History:  Diagnosis Date  . Anxiety   . Arthritis   . Avascular necrosis (HCC)    Left hip  . Bipolar 1 disorder (HCC)   . COPD (chronic obstructive pulmonary disease) (HCC)   . Pneumonia   . PTSD (post-traumatic stress disorder)     Past Surgical History:  Procedure Laterality Date  . bone spurs foot Right   . COLONOSCOPY    . HAND SURGERY Left   . TONSILLECTOMY AND ADENOIDECTOMY      No current facility-administered  medications for this encounter.    Allergies  Allergen Reactions  . Celexa [Citalopram Hydrobromide]     "goes weird"  . Gabapentin     "goes weird"     Social History   Tobacco Use  . Smoking status: Former Smoker    Packs/day: 0.50    Years: 43.00    Pack years: 21.50    Types: Cigarettes    Quit date: 10/19/2018    Years since quitting: 0.4  . Smokeless tobacco: Never Used  . Tobacco comment: pack per day 08/05/2018  Substance Use Topics  . Alcohol use: Yes    Comment: occ    Family History  Problem Relation Age of Onset  . CAD Mother 3680  . COPD Mother   . Depression Mother   . CAD Father 2453  . Hearing loss Father   . Depression Sister   . Heart attack Maternal Grandmother   . Early death Maternal Grandfather   . Hearing loss Paternal Grandfather      Review of Systems  Musculoskeletal: Positive for joint pain.  All other systems reviewed and are negative.   Objective:  Physical Exam  Constitutional: She is oriented to person, place, and time. She appears well-developed and well-nourished.  HENT:  Head: Normocephalic and atraumatic.  Neck: Normal range of motion. Neck supple.  Cardiovascular: Normal rate.  Respiratory: Effort normal.  GI: Soft.  Musculoskeletal:     Left hip: She exhibits decreased range of motion, decreased strength, tenderness and bony tenderness.  Neurological: She is  alert and oriented to person, place, and time.  Skin: Skin is warm and dry.  Psychiatric: She has a normal mood and affect.    Vital signs in last 24 hours: Temp:  [98.1 F (36.7 C)] 98.1 F (36.7 C) (06/12 4967) Pulse Rate:  [81] 81 (06/12 0613) Resp:  [18] 18 (06/12 0613) BP: (131)/(81) 131/81 (06/12 0613) SpO2:  [97 %] 97 % (06/12 0613)  Labs:   Estimated body mass index is 24.25 kg/m as calculated from the following:   Height as of 03/10/19: 5\' 10"  (1.778 m).   Weight as of 03/10/19: 76.7 kg.   Imaging Review Plain radiographs demonstrate severe  avascular necrosis of the left hip(s). The bone quality appears to be good for age and reported activity level.      Assessment/Plan:  End stage AVN, left hip(s)  The patient history, physical examination, clinical judgement of the provider and imaging studies are consistent with end stage avascular necrosis of the left hip(s) and total hip arthroplasty is deemed medically necessary. The treatment options including medical management, injection therapy, arthroscopy and arthroplasty were discussed at length. The risks and benefits of total hip arthroplasty were presented and reviewed. The risks due to aseptic loosening, infection, stiffness, dislocation/subluxation,  thromboembolic complications and other imponderables were discussed.  The patient acknowledged the explanation, agreed to proceed with the plan and consent was signed. Patient is being admitted for inpatient treatment for surgery, pain control, PT, OT, prophylactic antibiotics, VTE prophylaxis, progressive ambulation and ADL's and discharge planning.The patient is planning to be discharged home with home health services

## 2019-03-19 NOTE — Evaluation (Signed)
Physical Therapy Evaluation Patient Details Name: Judy [name] MRN: [id number] DOB: 1951-05-28 Today's Date: 03/19/2019   History of Present Illness  L AA-THA  Clinical Impression  Pt is s/p THA resulting in the deficits listed below (see PT Problem List). Pt ambulated 10' with RW, initiated THA HEP. Good progress expected. Pt stated she lacks 24* assist at home, I encouraged her to arrange help from family.  Pt will benefit from skilled PT to increase their independence and safety with mobility to allow discharge to the venue listed below.      Follow Up Recommendations Follow surgeon's recommendation for DC plan and follow-up therapies;Home health PT(HHPT recommended)    Equipment Recommendations  3in1 (PT)    Recommendations for Other Services       Precautions / Restrictions Precautions Precautions: Fall Restrictions Weight Bearing Restrictions: No Other Position/Activity Restrictions: WBAT      Mobility  Bed Mobility Overal bed mobility: Needs Assistance Bed Mobility: Supine to Sit     Supine to sit: Mod assist     General bed mobility comments: assist to raise trunk and support LLE, increased time  Transfers Overall transfer level: Needs assistance Equipment used: Rolling walker (2 wheeled) Transfers: Sit to/from Stand Sit to Stand: Min assist         General transfer comment: VCs hand placement, min A to rise  Ambulation/Gait Ambulation/Gait assistance: Min guard Gait Distance (Feet): 10 Feet Assistive device: Rolling walker (2 wheeled) Gait Pattern/deviations: Step-to pattern;Decreased stride length;Decreased weight shift to left Gait velocity: decr   General Gait Details: VCs sequencing, no loss of balance  Stairs            Wheelchair Mobility    Modified Rankin (Stroke Patients Only)       Balance Overall balance assessment: Modified Independent                                           Pertinent  Vitals/Pain Pain Assessment: 0-10 Pain Score: 6  Pain Location: L hip Pain Descriptors / Indicators: Sore Pain Intervention(s): Limited activity within patient's tolerance;Monitored during session;Premedicated before session;Ice applied    Home Living Family/patient expects to be discharged to:: Private residence Living Arrangements: Alone Available Help at Discharge: Available PRN/intermittently;Family(pt stated she lacks 24* assist, her daughter can come by intermittently (daughter works from home but has children to care for))   Home Access: Stairs to enter Entrance Stairs-Rails: LawyerLeft;Right Entrance Stairs-Number of Steps: 4 Home Layout: One level Home Equipment: Environmental consultantWalker - 2 wheels      Prior Function Level of Independence: Independent with assistive device(s)               Hand Dominance        Extremity/Trunk Assessment   Upper Extremity Assessment Upper Extremity Assessment: Overall WFL for tasks assessed    Lower Extremity Assessment Lower Extremity Assessment: LLE deficits/detail LLE Deficits / Details: hip flexion AAROM ~35*, knee ext -3/5 LLE Sensation: WNL    Cervical / Trunk Assessment Cervical / Trunk Assessment: Normal  Communication   Communication: No difficulties  Cognition Arousal/Alertness: Awake/alert Behavior During Therapy: WFL for tasks assessed/performed Overall Cognitive Status: Within Functional Limits for tasks assessed  General Comments      Exercises Total Joint Exercises Ankle Circles/Pumps: AROM;Both;10 reps;Supine Heel Slides: AAROM;Left;10 reps;Supine Hip ABduction/ADduction: AAROM;Left;10 reps;Supine Long Arc Quad: AROM;Left;5 reps;Seated   Assessment/Plan    PT Assessment Patient needs continued PT services  PT Problem List Decreased strength;Decreased range of motion;Decreased activity tolerance;Decreased balance;Decreased knowledge of use of DME;Decreased  mobility       PT Treatment Interventions DME instruction;Gait training;Functional mobility training;Stair training;Therapeutic exercise;Therapeutic activities    PT Goals (Current goals can be found in the Care Plan section)  Acute Rehab PT Goals Patient Stated Goal: to walk farther PT Goal Formulation: With patient Time For Goal Achievement: 03/26/19 Potential to Achieve Goals: Good    Frequency 7X/week   Barriers to discharge Decreased caregiver support pt lacks 24* assist    Co-evaluation               AM-PAC PT "6 Clicks" Mobility  Outcome Measure Help needed turning from your back to your side while in a flat bed without using bedrails?: A Little Help needed moving from lying on your back to sitting on the side of a flat bed without using bedrails?: A Lot Help needed moving to and from a bed to a chair (including a wheelchair)?: A Little Help needed standing up from a chair using your arms (e.g., wheelchair or bedside chair)?: A Little Help needed to walk in hospital room?: A Little Help needed climbing 3-5 steps with a railing? : A Lot 6 Click Score: 16    End of Session Equipment Utilized During Treatment: Gait belt Activity Tolerance: Patient tolerated treatment well Patient left: in chair;with call bell/phone within reach;with chair alarm set Nurse Communication: Mobility status PT Visit Diagnosis: Muscle weakness (generalized) (M62.81);Difficulty in walking, not elsewhere classified (R26.2);Pain Pain - Right/Left: Left Pain - part of body: Hip    Time: 7106-2694 PT Time Calculation (min) (ACUTE ONLY): 15 min   Charges:   PT Evaluation $PT Eval Low Complexity: 1 Low          Blondell Reveal Kistler PT 03/19/2019  Acute Rehabilitation Services Pager [phone] Office [phone]

## 2019-03-20 LAB — CBC
HCT: 35.1 % — ABNORMAL LOW (ref 36.0–46.0)
Hemoglobin: 11 g/dL — ABNORMAL LOW (ref 12.0–15.0)
MCH: 29.7 pg (ref 26.0–34.0)
MCHC: 31.3 g/dL (ref 30.0–36.0)
MCV: 94.9 fL (ref 80.0–100.0)
Platelets: 232 10*3/uL (ref 150–400)
RBC: 3.7 MIL/uL — ABNORMAL LOW (ref 3.87–5.11)
RDW: 12.3 % (ref 11.5–15.5)
WBC: 9.7 10*3/uL (ref 4.0–10.5)
nRBC: 0 % (ref 0.0–0.2)

## 2019-03-20 LAB — BASIC METABOLIC PANEL
Anion gap: 9 (ref 5–15)
BUN: 9 mg/dL (ref 8–23)
CO2: 24 mmol/L (ref 22–32)
Calcium: 9.3 mg/dL (ref 8.9–10.3)
Chloride: 105 mmol/L (ref 98–111)
Creatinine, Ser: 0.85 mg/dL (ref 0.44–1.00)
GFR calc Af Amer: >60 mL/min (ref >60)
GFR calc non Af Amer: >60 mL/min (ref >60)
Glucose, Bld: 127 mg/dL — ABNORMAL HIGH (ref 70–99)
Potassium: 4.9 mmol/L (ref 3.5–5.1)
Sodium: 138 mmol/L (ref 135–145)

## 2019-03-20 MED ORDER — METHOCARBAMOL 500 MG PO TABS: 500.0000 mg | ORAL_TABLET | Freq: Four times a day (QID) | ORAL | 0 refills | Status: DC | PRN

## 2019-03-20 MED ORDER — OXYCODONE HCL 5 MG PO TABS: 5.0000 mg | ORAL_TABLET | Freq: Four times a day (QID) | ORAL | 0 refills | Status: DC | PRN

## 2019-03-20 MED ORDER — ASPIRIN 81 MG PO CHEW: 81.0000 mg | CHEWABLE_TABLET | Freq: Two times a day (BID) | ORAL | 0 refills | Status: DC

## 2019-03-20 NOTE — TOC Initial Note (Signed)
Transition of Care [facility]) - Initial/Assessment Note    Patient Details  Name: Judy [name] MRN: [id number] Date of Birth: 11-25-1950  [facility] CM/SW Contact:    Joaquin Courts, RN Phone Number: 03/20/2019, 12:34 PM  Clinical Narrative:    CM spoke with patient at bedside. patient's daughter will stay with her for a couple of days post discharge to help as needed.  [facility] for [facility] and has rolling walker and 3-in-1 at home.                 Expected Discharge Plan: [place] Barriers to Discharge: Continued Medical Work up   Patient Goals and CMS Choice Patient states their goals for this hospitalization and ongoing recovery are:: to go home CMS Medicare.gov Compare Post Acute Care list provided to:: Patient Choice offered to / list presented to : Patient  Expected Discharge Plan and Services Expected Discharge Plan: [place]   Discharge Planning Services: CM Consult Post Acute Care Choice: [place] arrangements for the past 2 months: Single Family Home                 DME Arranged: N/A DME Agency: NA       HH Arranged: PT [place] Agency: [facility] [facility] [employer] Date [place]: 03/20/19 Time [place]: 1233 Representative spoke with at [place]: set up in doctors office  Prior Living Arrangements/Services Living arrangements for the past 2 months: [place] with:: Self Patient language and need for interpreter reviewed:: Yes Do you feel safe going back to the place where you live?: Yes      Need for Family Participation in Patient Care: Yes (Comment) Care giver support system in place?: Yes (comment)   Criminal Activity/Legal Involvement Pertinent to Current Situation/Hospitalization: No - Comment as needed  Activities of Daily Living Home Assistive Devices/Equipment: Eyeglasses, Wheelchair, Environmental consultant (specify  type) ADL Screening (condition at time of admission) Patient's cognitive ability adequate to safely complete daily activities?: Yes Is the patient deaf or have difficulty hearing?: No Does the patient have difficulty seeing, even when wearing glasses/contacts?: No Does the patient have difficulty concentrating, remembering, or making decisions?: No Patient able to express need for assistance with ADLs?: Yes Does the patient have difficulty dressing or bathing?: Yes Independently performs ADLs?: No Communication: Independent Dressing (OT): Needs assistance Is this a change from baseline?: Pre-admission baseline Grooming: Needs assistance Is this a change from baseline?: Pre-admission baseline Feeding: Independent Bathing: Independent Toileting: Independent In/Out Bed: Independent Walks in Home: Independent Does the patient have difficulty walking or climbing stairs?: Yes Weakness of Legs: Both Weakness of Arms/Hands: None  Permission Sought/Granted                  Emotional Assessment Appearance:: Appears stated age Attitude/Demeanor/Rapport: Engaged Affect (typically observed): Accepting Orientation: : Oriented to Self, Oriented to Place, Oriented to  Time, Oriented to Situation   Psych Involvement: No (comment)  Admission diagnosis:  avascular necrosis left hip Patient Active Problem List   Diagnosis Date Noted  . Status post total replacement of left hip 03/19/2019  . Avascular necrosis of bone of hip, left ([place]) 12/28/2018  . PTSD (post-traumatic stress disorder)   . COPD (chronic obstructive pulmonary disease) ([place])   . Bipolar 1 disorder ([place])   . Anxiety   . B12 deficiency 12/09/2018  . COPD  with acute exacerbation (HCC) 04/14/2017  . Hyperglycemia 04/14/2017   PCP:  Wynn BankerKoberlein, Junell C, MD Pharmacy:   [facility] [address] - [place], [place] - [id number] [place] [address] Phone: [phone] Fax:  [phone]     Social Determinants of Health (SDOH) Interventions    Readmission Risk Interventions No flowsheet data found.

## 2019-03-20 NOTE — Progress Notes (Signed)
    Home health agencies that serve [id number].        Home Health Agencies Search Results  Results List Table  Home Health Agency Information Quality of Patient Care Rating Patient Survey Summary Rating  [facility] [phone] 3 out of 5 stars 4 out of 5 stars  [facility] [phone] 4 out of 5 stars 4 out of 5 stars  [facility] [phone] 4  out of 5 stars 3 out of 5 stars  [facility] [phone] 4  out of 5 stars 4 out of 5 stars  [facility] [phone] 4 out of 5 stars 4 out of 5 stars  [facility] [phone] 4 out of 5 stars 4 out of 5 stars  [facility] [phone] 3  out of 5 stars 4 out of 5 stars  [facility] [phone] 3 out of 5 stars 4 out of 5 stars  [facility] [phone] 4 out of 5 stars Not Available12  [facility] [phone] 3  out of 5 stars 3 out of 5 stars  [facility] [phone] 4 out of 5 stars 4 out of 5 stars  [facility] [phone] 3  out of 5 stars 4 out of 5 stars  [facility] [phone] 3  out of 5 stars 3 out of 5 stars  [facility] [phone] 4  out of 5 stars 3 out of 5 stars  [facility] [phone] 4  out of 5 stars 2 out of 5 stars   Home Health Footnotes  Footnote number Footnote as displayed on Home Health Compare  1 This agency provides services under a federal waiver program to non-traditional, chronic long term population.  2 This agency provides services to a special needs population.  3 Not Available.  4 The number of patient episodes for this measure is too small to report.  5 This measure currently does not have data or provider has been certified/recertified for less than 6 months.  6 The national average for this measure is not provided because of state-to-state differences in data  collection.  7 Medicare is not displaying rates for this measure for any home health agency, because of an issue with the data.  8 There were problems with the data and they are being corrected.  9 Zero, or very few, patients met the survey's rules for inclusion. The scores shown, if any, reflect a very small number of surveys and may not accurately tell how an agency is doing.  10 Survey results are based on less than 12 months of data.  11 Fewer than 70 patients completed the survey. Use the scores shown, if any, with caution as the number of surveys may be too low to accurately tell how an agency is doing.  12 No survey results are available for this period.  13 Data suppressed by CMS for one or more quarters.    

## 2019-03-20 NOTE — Progress Notes (Signed)
  Subjective: Pt stable has walked to door   Objective: Vital signs in last 24 hours: Temp:  [94.8 F (34.9 C)-98.6 F (37 C)] 98.2 F (36.8 C) (06/13 0453) Pulse Rate:  [62-82] 76 (06/13 0453) Resp:  [10-22] 14 (06/13 0453) BP: (110-131)/(58-86) 119/75 (06/13 0453) SpO2:  [92 %-100 %] 98 % (06/13 0453) Weight:  [76.7 kg] 76.7 kg (06/12 1240)  Intake/Output from previous day: 06/12 0701 - 06/13 0700 In: 3635.1 [P.O.:480; I.V.:2855.1; IV Piggyback:300] Out: 4000 [Urine:3800; Blood:200] Intake/Output this shift: Total I/O In: -  Out: 250 [Urine:250]  Exam:  Dorsiflexion/Plantar flexion intact  Labs: Recent Labs    03/20/19 0236  HGB 11.0*   Recent Labs    03/20/19 0236  WBC 9.7  RBC 3.70*  HCT 35.1*  PLT 232   Recent Labs    03/19/19 0705 03/20/19 0236  NA 139 138  K 3.4* 4.9  CL 105 105  CO2 23 24  BUN 12 9  CREATININE 0.88 0.85  GLUCOSE 95 127*  CALCIUM 9.3 9.3   No results for input(s): LABPT, INR in the last 72 hours.  Assessment/Plan: Plan dc sun making good progress   G Alphonzo Severance 03/20/2019, 9:18 AM

## 2019-03-20 NOTE — Progress Notes (Signed)
Physical Therapy Treatment Patient Details Name: Judy [name] MRN: [id number] DOB: 1951/01/29 Today's Date: 03/20/2019    History of Present Illness L AA-THA    PT Comments    Pt ambulated from bed to bathroom and back to bed. She was unable to tolerate further ambulation 2* L hip pain. Performed L THA exercises with assistance. Will plan to do stair training tomorrow.    Follow Up Recommendations  Follow surgeon's recommendation for DC plan and follow-up therapies;Home health PT(HHPT recommended)     Equipment Recommendations  3in1 (PT)    Recommendations for Other Services       Precautions / Restrictions Precautions Precautions: Fall Restrictions Weight Bearing Restrictions: No Other Position/Activity Restrictions: WBAT    Mobility  Bed Mobility Overal bed mobility: Needs Assistance Bed Mobility: Sit to Supine     Supine to sit: Min assist;HOB elevated Sit to supine: Min assist   General bed mobility comments: assist to  support LLE, increased time  Transfers Overall transfer level: Needs assistance Equipment used: Rolling walker (2 wheeled) Transfers: Sit to/from Stand Sit to Stand: Min guard         General transfer comment: VCs hand placement  Ambulation/Gait Ambulation/Gait assistance: Min guard Gait Distance (Feet): 30 Feet(15' x 2) Assistive device: Rolling walker (2 wheeled) Gait Pattern/deviations: Step-to pattern;Decreased stride length;Decreased weight shift to left;Trunk flexed Gait velocity: decr   General Gait Details: VCs sequencing, no loss of balance, increased time   Stairs             Wheelchair Mobility    Modified Rankin (Stroke Patients Only)       Balance Overall balance assessment: Modified Independent                                          Cognition Arousal/Alertness: Awake/alert Behavior During Therapy: WFL for tasks assessed/performed Overall Cognitive Status: Within Functional  Limits for tasks assessed                                 General Comments: pt has very pronounced inhalation/exhalation with puffing of cheeks; pt stated this is due to anxiety      Exercises Total Joint Exercises Ankle Circles/Pumps: AROM;Both;10 reps;Supine Short Arc Quad: AROM;Left;10 reps;Supine Heel Slides: AAROM;Left;Supine;15 reps Hip ABduction/ADduction: AAROM;Left;Supine;15 reps    General Comments        Pertinent Vitals/Pain Pain Score: 6  Pain Location: L hip Pain Descriptors / Indicators: Sore Pain Intervention(s): Limited activity within patient's tolerance;Monitored during session;Patient requesting pain meds-RN notified;Ice applied    Home Living                      Prior Function            PT Goals (current goals can now be found in the care plan section) Acute Rehab PT Goals Patient Stated Goal: to walk farther PT Goal Formulation: With patient Time For Goal Achievement: 03/26/19 Potential to Achieve Goals: Good Progress towards PT goals: Progressing toward goals    Frequency    7X/week      PT Plan Current plan remains appropriate    Co-evaluation              AM-PAC PT "6 Clicks" Mobility   Outcome Measure  Help needed turning from your  back to your side while in a flat bed without using bedrails?: A Little Help needed moving from lying on your back to sitting on the side of a flat bed without using bedrails?: A Lot Help needed moving to and from a bed to a chair (including a wheelchair)?: A Little Help needed standing up from a chair using your arms (e.g., wheelchair or bedside chair)?: A Little Help needed to walk in hospital room?: A Little Help needed climbing 3-5 steps with a railing? : A Lot 6 Click Score: 16    End of Session Equipment Utilized During Treatment: Gait belt Activity Tolerance: Patient tolerated treatment well Patient left: with call bell/phone within reach;in bed Nurse  Communication: Mobility status;Patient requests pain meds PT Visit Diagnosis: Muscle weakness (generalized) (M62.81);Difficulty in walking, not elsewhere classified (R26.2);Pain Pain - Right/Left: Left Pain - part of body: Hip     Time: 9604-54091356-1417 PT Time Calculation (min) (ACUTE ONLY): 21 min  Charges: $Therapeutic Exercise: 8-22 mins                    Ralene BatheUhlenberg, Jacque Byron Kistler PT 03/20/2019  Acute Rehabilitation Services Pager [phone] Office [phone]

## 2019-03-20 NOTE — Discharge Instructions (Signed)

## 2019-03-20 NOTE — Progress Notes (Signed)
Physical Therapy Treatment Patient Details Name: Judy [name] MRN: [id number] DOB: 01-07-1951 Today's Date: 03/20/2019    History of Present Illness L AA-THA    PT Comments    Pt tolerated increased ambulation distance of 5870' with RW. Mod assist for supine to sit. Pt performed several THA exercises, but was limited by pain. Increased time for all mobility.   Follow Up Recommendations  Follow surgeon's recommendation for DC plan and follow-up therapies;Home health PT(HHPT recommended)     Equipment Recommendations  3in1 (PT)    Recommendations for Other Services       Precautions / Restrictions Precautions Precautions: Fall Restrictions Weight Bearing Restrictions: No Other Position/Activity Restrictions: WBAT    Mobility  Bed Mobility Overal bed mobility: Needs Assistance Bed Mobility: Supine to Sit     Supine to sit: Mod assist     General bed mobility comments: assist to raise trunk and support LLE, increased time  Transfers Overall transfer level: Needs assistance Equipment used: Rolling walker (2 wheeled) Transfers: Sit to/from Stand Sit to Stand: Min assist         General transfer comment: VCs hand placement, min A to rise  Ambulation/Gait Ambulation/Gait assistance: Min guard Gait Distance (Feet): 70 Feet Assistive device: Rolling walker (2 wheeled) Gait Pattern/deviations: Step-to pattern;Decreased stride length;Decreased weight shift to left;Trunk flexed Gait velocity: decr   General Gait Details: VCs sequencing, no loss of balance, increased time   Stairs             Wheelchair Mobility    Modified Rankin (Stroke Patients Only)       Balance Overall balance assessment: Modified Independent                                          Cognition Arousal/Alertness: Awake/alert Behavior During Therapy: WFL for tasks assessed/performed Overall Cognitive Status: Within Functional Limits for tasks assessed                                 General Comments: pt has very pronounced inhalation/exhalation with puffing of cheeks; pt stated this is due to anxiety      Exercises Total Joint Exercises Ankle Circles/Pumps: AROM;Both;10 reps;Supine Heel Slides: AAROM;Left;10 reps;Supine Hip ABduction/ADduction: AAROM;Left;10 reps;Supine    General Comments        Pertinent Vitals/Pain Pain Score: 7  Pain Location: L hip Pain Descriptors / Indicators: Sore;Shooting Pain Intervention(s): Monitored during session;Limited activity within patient's tolerance;Premedicated before session;Ice applied    Home Living                      Prior Function            PT Goals (current goals can now be found in the care plan section) Acute Rehab PT Goals Patient Stated Goal: to walk farther PT Goal Formulation: With patient Time For Goal Achievement: 03/26/19 Potential to Achieve Goals: Good Progress towards PT goals: Progressing toward goals    Frequency    7X/week      PT Plan Current plan remains appropriate    Co-evaluation              AM-PAC PT "6 Clicks" Mobility   Outcome Measure  Help needed turning from your back to your side while in a flat bed without using bedrails?: A  Little Help needed moving from lying on your back to sitting on the side of a flat bed without using bedrails?: A Lot Help needed moving to and from a bed to a chair (including a wheelchair)?: A Little Help needed standing up from a chair using your arms (e.g., wheelchair or bedside chair)?: A Little Help needed to walk in hospital room?: A Little Help needed climbing 3-5 steps with a railing? : A Lot 6 Click Score: 16    End of Session Equipment Utilized During Treatment: Gait belt Activity Tolerance: Patient tolerated treatment well Patient left: in chair;with call bell/phone within reach;with chair alarm set Nurse Communication: Mobility status PT Visit Diagnosis: Muscle  weakness (generalized) (M62.81);Difficulty in walking, not elsewhere classified (R26.2);Pain Pain - Right/Left: Left Pain - part of body: Hip     Time: 3833-3832 PT Time Calculation (min) (ACUTE ONLY): 24 min  Charges:  $Gait Training: 8-22 mins $Therapeutic Exercise: 8-22 mins                     Blondell Reveal Kistler PT 03/20/2019  Acute Rehabilitation Services Pager [phone] Office [phone]

## 2019-03-21 NOTE — Progress Notes (Signed)
Physical Therapy Treatment Patient Details Name: Judy [name] MRN: [id number] DOB: 1950-12-31 Today's Date: 03/21/2019    History of Present Illness L AA-THA    PT Comments    Pt still moving slow, however walking to from bathroom with nursing staff. Pt does not tolerate a far distance, but has done multiple shorter distances at this time. Pt states she is shooting for DC Monday. Will continue to follow and work on steps in the a.m.  Pt states her daughter will be assisting her at home. We also discussed which car to DC in for safety.    Follow Up Recommendations  Follow surgeon's recommendation for DC plan and follow-up therapies;Home health PT(HHPT recommended)     Equipment Recommendations  3in1 (PT)    Recommendations for Other Services       Precautions / Restrictions      Mobility  Bed Mobility Overal bed mobility: Needs Assistance Bed Mobility: Sit to Supine     Supine to sit: Min assist;HOB elevated Sit to supine: Min assist   General bed mobility comments: assist to  support LLE, increased time  Transfers Overall transfer level: Needs assistance Equipment used: Rolling walker (2 wheeled) Transfers: Sit to/from Stand Sit to Stand: Min guard         General transfer comment: VCs hand placement  Ambulation/Gait Ambulation/Gait assistance: Min assist Gait Distance (Feet): 30 Feet Assistive device: Rolling walker (2 wheeled) Gait Pattern/deviations: Step-to pattern;Decreased stride length;Decreased weight shift to left Gait velocity: decr   General Gait Details: VCs sequencing, no loss of balance, increased time. Pt with labored breathing pattern however this is even at rest, pt states it is anxiety. However she got dizzy at 30 feet and had to bring the recliner for her to sit.   Stairs             Wheelchair Mobility    Modified Rankin (Stroke Patients Only)       Balance Overall balance assessment: Modified Independent                                           Cognition Arousal/Alertness: Awake/alert Behavior During Therapy: WFL for tasks assessed/performed Overall Cognitive Status: Within Functional Limits for tasks assessed                                        Exercises Total Joint Exercises Ankle Circles/Pumps: AROM;Both;10 reps;Supine Heel Slides: AAROM;Left;Supine;15 reps Hip ABduction/ADduction: AAROM;Left;Supine;15 reps    General Comments        Pertinent Vitals/Pain Pain Score: 7  Pain Location: L hip Pain Descriptors / Indicators: Aching Pain Intervention(s): Monitored during session;Ice applied    Home Living                      Prior Function            PT Goals (current goals can now be found in the care plan section) Acute Rehab PT Goals Patient Stated Goal: to walk farther PT Goal Formulation: With patient Time For Goal Achievement: 03/26/19 Potential to Achieve Goals: Good Progress towards PT goals: Progressing toward goals    Frequency    7X/week      PT Plan Current plan remains appropriate    Co-evaluation  AM-PAC PT "6 Clicks" Mobility   Outcome Measure  Help needed turning from your back to your side while in a flat bed without using bedrails?: A Little Help needed moving from lying on your back to sitting on the side of a flat bed without using bedrails?: A Little Help needed moving to and from a bed to a chair (including a wheelchair)?: A Little Help needed standing up from a chair using your arms (e.g., wheelchair or bedside chair)?: A Little Help needed to walk in hospital room?: A Little Help needed climbing 3-5 steps with a railing? : A Lot 6 Click Score: 17    End of Session Equipment Utilized During Treatment: Gait belt Activity Tolerance: Patient tolerated treatment well Patient left: with call bell/phone within reach;in chair Nurse Communication: Mobility status;Patient requests pain  meds PT Visit Diagnosis: Muscle weakness (generalized) (M62.81);Difficulty in walking, not elsewhere classified (R26.2);Pain Pain - Right/Left: Left Pain - part of body: Hip     Time: 1009-1040 PT Time Calculation (min) (ACUTE ONLY): 31 min  Charges:  $Gait Training: 8-22 mins $Therapeutic Exercise: 8-22 mins                     Judy [name], PT Acute Rehabilitation Services Pager: [phone] Office: [phone] 03/21/2019    Judy BileBRITT, Judy Shiroma 03/21/2019, 4:13 PM

## 2019-03-21 NOTE — Progress Notes (Signed)
  Subjective: Pt stable but feels strongly she cannot make it home today due to pain and movement issues   Objective: Vital signs in last 24 hours: Temp:  [98.4 F (36.9 C)-99.8 F (37.7 C)] 98.5 F (36.9 C) (06/14 0433) Pulse Rate:  [80-93] 93 (06/14 0433) Resp:  [15-16] 16 (06/14 0433) BP: (108-123)/(66-75) 108/75 (06/14 0433) SpO2:  [97 %-100 %] 97 % (06/14 0433)  Intake/Output from previous day: 06/13 0701 - 06/14 0700 In: 480 [P.O.:480] Out: 2300 [Urine:2300] Intake/Output this shift: No intake/output data recorded.  Exam:  Dorsiflexion/Plantar flexion intact  Labs: Recent Labs    03/20/19 0236  HGB 11.0*   Recent Labs    03/20/19 0236  WBC 9.7  RBC 3.70*  HCT 35.1*  PLT 232   Recent Labs    03/19/19 0705 03/20/19 0236  NA 139 138  K 3.4* 4.9  CL 105 105  CO2 23 24  BUN 12 9  CREATININE 0.88 0.85  GLUCOSE 95 127*  CALCIUM 9.3 9.3   No results for input(s): LABPT, INR in the last 72 hours.  Assessment/Plan: Plan for dc Monday - she is walking in the room but wants to be more mobile before dc   [employer] 03/21/2019, 8:22 AM

## 2019-03-21 NOTE — Progress Notes (Signed)
  2nd session with PT   PT tolerated well, pt had already been back and forth with nursing to the bathroom several times, so focused on bed supine THA exercises. Pt very fatigued at this time.    03/21/19 1615  PT Visit Information  Last PT Received On 03/21/19  Assistance Needed +1  Subjective Data  Patient Stated Goal to walk farther  Cognition  Arousal/Alertness Awake/alert  Behavior During Therapy [facility] for tasks assessed/performed  Overall Cognitive Status Within Functional Limits for tasks assessed  Total Joint Exercises  Ankle Circles/Pumps AROM;Both;10 reps;Supine  Heel Slides AAROM;Left;Supine;15 reps  Hip ABduction/ADduction AAROM;Left;Supine;15 reps  PT - End of Session  Activity Tolerance Patient tolerated treatment well  Patient left in bed;with call bell/phone within reach   PT - Assessment/Plan  PT Plan Current plan remains appropriate  PT Visit Diagnosis Muscle weakness (generalized) (M62.81);Difficulty in walking, not elsewhere classified (R26.2);Pain  Pain - Right/Left Left  Pain - part of body Hip  PT Frequency (ACUTE ONLY) 7X/week  Follow Up Recommendations Follow surgeon's recommendation for DC plan and follow-up therapies;Home health PT  AM-PAC PT "6 Clicks" Mobility Outcome Measure (Version 2)  Help needed turning from your back to your side while in a flat bed without using bedrails? 3  Help needed moving from lying on your back to sitting on the side of a flat bed without using bedrails? 3  Help needed moving to and from a bed to a chair (including a wheelchair)? 3  Help needed standing up from a chair using your arms (e.g., wheelchair or bedside chair)? 3  Help needed to walk in hospital room? 3  Help needed climbing 3-5 steps with a railing?  2  6 Click Score 17  Consider Recommendation of Discharge To: Home with [facility]  PT Goal Progression  Progress towards PT goals Progressing toward goals  Acute Rehab PT Goals  PT Goal Formulation With patient  Time  For Goal Achievement 03/26/19  Potential to Achieve Goals Good  PT Time Calculation  PT Start Time (ACUTE ONLY) 1412  PT Stop Time (ACUTE ONLY) 1430  PT Time Calculation (min) (ACUTE ONLY) 18 min  PT General Charges  $$ ACUTE PT VISIT 1 Visit  PT Treatments  $Therapeutic Exercise 8-22 mins

## 2019-03-21 NOTE — Plan of Care (Signed)
  Problem: Health Behavior/Discharge Planning: Goal: Ability to manage health-related needs will improve Outcome: Progressing   Problem: Clinical Measurements: Goal: Respiratory complications will improve Outcome: Progressing Goal: Cardiovascular complication will be avoided Outcome: Progressing   Problem: Activity: Goal: Risk for activity intolerance will decrease Outcome: Progressing   Problem: Nutrition: Goal: Adequate nutrition will be maintained Outcome: Progressing   Problem: Coping: Goal: Level of anxiety will decrease Outcome: Progressing   Problem: Elimination: Goal: Will not experience complications related to bowel motility Outcome: Progressing Goal: Will not experience complications related to urinary retention Outcome: Progressing   Problem: Pain Managment: Goal: General experience of comfort will improve Outcome: Progressing   Problem: Safety: Goal: Ability to remain free from injury will improve Outcome: Progressing   Problem: Skin Integrity: Goal: Risk for impaired skin integrity will decrease Outcome: Progressing   Problem: Education: Goal: Knowledge of the prescribed therapeutic regimen will improve Outcome: Progressing Goal: Understanding of discharge needs will improve Outcome: Progressing Goal: Individualized Educational Video(s) Outcome: Progressing   Problem: Activity: Goal: Ability to avoid complications of mobility impairment will improve Outcome: Progressing Goal: Ability to tolerate increased activity will improve Outcome: Progressing   Problem: Clinical Measurements: Goal: Postoperative complications will be avoided or minimized Outcome: Progressing   Problem: Pain Management: Goal: Pain level will decrease with appropriate interventions Outcome: Progressing   Problem: Skin Integrity: Goal: Will show signs of wound healing Outcome: Progressing

## 2019-03-22 ENCOUNTER — Encounter (HOSPITAL_COMMUNITY): Payer: Self-pay | Admitting: Orthopaedic Surgery

## 2019-03-22 NOTE — Discharge Summary (Signed)
Patient ID: Judy SnellCatherine A Wulf MRN: [id number] DOB/AGE: 1951/06/25 68 y.o.  Admit date: 03/19/2019 Discharge date: 03/22/2019  Admission Diagnoses:  Principal Problem:   Avascular necrosis of bone of hip, left (HCC) Active Problems:   Status post total replacement of left hip   Discharge Diagnoses:  Same  Past Medical History:  Diagnosis Date  . Anxiety   . Arthritis   . Avascular necrosis (HCC)    Left hip  . Bipolar 1 disorder (HCC)   . COPD (chronic obstructive pulmonary disease) (HCC)   . Pneumonia   . PTSD (post-traumatic stress disorder)     Surgeries: Procedure(s): LEFT TOTAL HIP ARTHROPLASTY ANTERIOR APPROACH on 03/19/2019   Consultants:   Discharged Condition: Improved  Hospital Course: Judy [name] is an 68 y.o. female who was admitted 03/19/2019 for operative treatment ofAvascular necrosis of bone of hip, left (HCC). Patient has severe unremitting pain that affects sleep, daily activities, and work/hobbies. After pre-op clearance the patient was taken to the operating room on 03/19/2019 and underwent  Procedure(s): LEFT TOTAL HIP ARTHROPLASTY ANTERIOR APPROACH.    Patient was given perioperative antibiotics:  Anti-infectives (From admission, onward)   Start     Dose/Rate Route Frequency Ordered Stop   03/19/19 1400  ceFAZolin (ANCEF) IVPB 1 g/50 mL premix     1 g 100 mL/hr over 30 Minutes Intravenous Every 6 hours 03/19/19 1251 03/19/19 2144   03/19/19 0700  ceFAZolin (ANCEF) IVPB 2g/100 mL premix     2 g 200 mL/hr over 30 Minutes Intravenous On call to O.R. 03/19/19 [id number] 03/19/19 [id number]       Patient was given sequential compression devices, early ambulation, and chemoprophylaxis to prevent DVT.  Patient benefited maximally from hospital stay and there were no complications.  Patient stay extended due to slow progress with PT.   Recent vital signs:  Patient Vitals for the past 24 hrs:  BP Temp Temp src Pulse Resp SpO2  03/22/19 0644 114/72 98.5 F  (36.9 C) Oral 85 - 98 %  03/21/19 2104 112/63 99 F (37.2 C) Oral 84 17 96 %  03/21/19 1607 - - - - - 98 %  03/21/19 1400 113/73 98.6 F (37 C) - 87 15 98 %     Recent laboratory studies:  Recent Labs    03/20/19 0236  WBC 9.7  HGB 11.0*  HCT 35.1*  PLT 232  NA 138  K 4.9  CL 105  CO2 24  BUN 9  CREATININE 0.85  GLUCOSE 127*  CALCIUM 9.3     Discharge Medications:   Allergies as of 03/22/2019      Reactions   Celexa [citalopram Hydrobromide]    "goes weird"   Gabapentin    "goes weird"       Medication List    STOP taking these medications   acetaminophen-codeine 300-30 MG tablet Commonly known as: TYLENOL #3     TAKE these medications   acetaminophen 500 MG tablet Commonly known as: TYLENOL Take 1,000 mg by mouth every 6 (six) hours as needed for moderate pain.   aspirin 81 MG chewable tablet Chew 1 tablet (81 mg total) by mouth 2 (two) times daily.   LORazepam 0.5 MG tablet Commonly known as: ATIVAN Take 1.5 mg by mouth at bedtime.   lurasidone 80 MG Tabs tablet Commonly known as: LATUDA Take 80 mg by mouth every evening.   methocarbamol 500 MG tablet Commonly known as: ROBAXIN Take 1 tablet (500 mg total) by mouth  every 6 (six) hours as needed for muscle spasms.   oxyCODONE 5 MG immediate release tablet Commonly known as: Oxy IR/ROXICODONE Take 1-2 tablets (5-10 mg total) by mouth every 6 (six) hours as needed for moderate pain (pain score 4-6).   QUEtiapine 300 MG tablet Commonly known as: SEROQUEL Take 800 mg by mouth at bedtime.   Vraylar capsule Generic drug: cariprazine Take 3 mg by mouth every evening.            Durable Medical Equipment  (From admission, onward)         Start     Ordered   03/19/19 1251  DME 3 n 1  Once     03/19/19 1251   03/19/19 1251  DME Walker rolling  Once    Question:  Patient needs a walker to treat with the following condition  Answer:  Status post total replacement of left hip   03/19/19  1251          Diagnostic Studies: Dg Pelvis Portable  Result Date: 03/19/2019 CLINICAL DATA:  Status post left hip replacement today. EXAM: PORTABLE PELVIS 1-2 VIEWS COMPARISON:  Intraoperative imaging earlier today. FINDINGS: Left total hip arthroplasty is in place. Surgical staples and gas in the soft tissues noted. The device is located. No fracture. Avascular necrosis of the right femoral head with flattening and sclerosis is identified. IMPRESSION: Status post left hip replacement.  No acute finding. Advanced avascular necrosis right femoral head. Electronically Signed   By: Inge Rise M.D.   On: 03/19/2019 11:37   Dg C-arm 1-60 Min-no Report  Result Date: 03/19/2019 Fluoroscopy was utilized by the requesting physician.  No radiographic interpretation.   Dg Hip Operative Unilat W Or W/o Pelvis Left  Result Date: 03/19/2019 CLINICAL DATA:  Surgery, elective.  Left hip replacement. EXAM: OPERATIVE left HIP (WITH PELVIS IF PERFORMED) 6 VIEWS TECHNIQUE: Fluoroscopic spot image(s) were submitted for interpretation post-operatively. COMPARISON:  None. FINDINGS: Intraoperative fluoro images demonstrate left total hip arthroplasty. Components appear well seated. No new fractures are present. IMPRESSION: Left total hip arthroplasty without radiographic evidence for complication. Electronically Signed   By: San Morelle M.D.   On: 03/19/2019 10:14    Disposition:     Follow-up Information    Mcarthur Rossetti, MD. Schedule an appointment as soon as possible for a visit in 2 week(s).   Specialty: Orthopedic Surgery Contact information: [place] [address] [phone]        [facility] Follow up.   Specialty: Home Health Services Why: agency will provide home health physical therapy, agency will contact you to set up initial visit. Contact information: [address] [place]  [address] [phone]             Signed: Erskine Emery 03/22/2019, 7:41 AM

## 2019-03-22 NOTE — Progress Notes (Signed)
Subjective: 3 Days Post-Op Procedure(s) (LRB): LEFT TOTAL HIP ARTHROPLASTY ANTERIOR APPROACH (Left) Patient reports pain as moderate.  No chest pain or SOB.   Objective: Vital signs in last 24 hours: Temp:  [98.5 F (36.9 C)-99 F (37.2 C)] 98.5 F (36.9 C) (06/15 0644) Pulse Rate:  [84-87] 85 (06/15 0644) Resp:  [15-17] 17 (06/14 2104) BP: (112-114)/(63-73) 114/72 (06/15 0644) SpO2:  [96 %-98 %] 98 % (06/15 0644)  Intake/Output from previous day: 06/14 0701 - 06/15 0700 In: 1540 [P.O.:1540] Out: -  Intake/Output this shift: No intake/output data recorded.  Recent Labs    03/20/19 0236  HGB 11.0*   Recent Labs    03/20/19 0236  WBC 9.7  RBC 3.70*  HCT 35.1*  PLT 232   Recent Labs    03/20/19 0236  NA 138  K 4.9  CL 105  CO2 24  BUN 9  CREATININE 0.85  GLUCOSE 127*  CALCIUM 9.3   No results for input(s): LABPT, INR in the last 72 hours.  Dorsiflexion/Plantar flexion intact Incision: scant drainage Compartment soft   Assessment/Plan: 3 Days Post-Op Procedure(s) (LRB): LEFT TOTAL HIP ARTHROPLASTY ANTERIOR APPROACH (Left) Up with therapy Discharge home with home health      Judy [name] 03/22/2019, 7:47 AM

## 2019-03-22 NOTE — Care Management Important Message (Signed)
Important Message  Patient Details IM Letter given to Velva Harman RN to present to the Patient Name: Judy [name] MRN: [id number] Date of Birth: 11-25-1950   Medicare Important Message Given:  Yes    Kerin Salen 03/22/2019, 12:53 PM

## 2019-03-22 NOTE — Progress Notes (Signed)
Patient states she had 2 large stools Saturday night in the ER. After arrival to floor had 4 stools this last being Sunday 6/14 at [place]

## 2019-03-22 NOTE — Progress Notes (Signed)
Physical Therapy Treatment Patient Details Name: Judy [name] MRN: [id number] DOB: Dec 22, 1950 Today's Date: 03/22/2019    History of Present Illness L AA-THA    PT Comments    POD #3 am session Pt OOB in recliner.  Assisted with amb in hallway, practiced stairs then returned to room and assisted to bathroom.  Addressed all mobility questions, discussed appropriate activity, educated on use of ICE.  Pt ready for D/C to home.   Follow Up Recommendations  Follow surgeon's recommendation for DC plan and follow-up therapies;Home health PT     Equipment Recommendations  3in1 (PT)    Recommendations for Other Services       Precautions / Restrictions Precautions Precautions: Fall Restrictions Weight Bearing Restrictions: No Other Position/Activity Restrictions: WBAT    Mobility  Bed Mobility               General bed mobility comments: OOB in recliner  Transfers Overall transfer level: Needs assistance Equipment used: Rolling walker (2 wheeled) Transfers: Sit to/from Stand Sit to Stand: Min guard;Supervision         General transfer comment: 25% VC's on proper hand placement esp with stand to sit.  Assisted on/off BSC.  Ambulation/Gait Ambulation/Gait assistance: Supervision;Min guard Gait Distance (Feet): 34 Feet Assistive device: Rolling walker (2 wheeled) Gait Pattern/deviations: Step-to pattern;Decreased stride length;Decreased weight shift to left Gait velocity: decreased   General Gait Details: <25% VC's on safety with turns and proper upright posture.  Distance limited by fatigue.   Stairs Stairs: Yes Stairs assistance: Supervision;Min guard Stair Management: Two rails;Step to pattern;Forwards Number of Stairs: 3 General stair comments: 25% VC's on proper sequencing and safety   Wheelchair Mobility    Modified Rankin (Stroke Patients Only)       Balance                                            Cognition  Arousal/Alertness: Awake/alert Behavior During Therapy: WFL for tasks assessed/performed Overall Cognitive Status: Within Functional Limits for tasks assessed                                 General Comments: c/o being "tired" and was hoping to take a nap      Exercises      General Comments        Pertinent Vitals/Pain Pain Assessment: 0-10 Pain Score: 6  Pain Location: L hip Pain Descriptors / Indicators: Tender;Tightness;Operative site guarding Pain Intervention(s): Monitored during session;Premedicated before session;Ice applied    Home Living                      Prior Function            PT Goals (current goals can now be found in the care plan section) Progress towards PT goals: Progressing toward goals    Frequency    7X/week      PT Plan Current plan remains appropriate    Co-evaluation              AM-PAC PT "6 Clicks" Mobility   Outcome Measure  Help needed turning from your back to your side while in a flat bed without using bedrails?: A Little Help needed moving from lying on your back to sitting on the side of a flat  bed without using bedrails?: A Little Help needed moving to and from a bed to a chair (including a wheelchair)?: A Little Help needed standing up from a chair using your arms (e.g., wheelchair or bedside chair)?: A Little Help needed to walk in hospital room?: A Little Help needed climbing 3-5 steps with a railing? : A Little 6 Click Score: 18    End of Session Equipment Utilized During Treatment: Gait belt Activity Tolerance: Patient tolerated treatment well Patient left: in chair;with call bell/phone within reach Nurse Communication: (pt completed PT and has met goals to D/C to home) PT Visit Diagnosis: Muscle weakness (generalized) (M62.81);Difficulty in walking, not elsewhere classified (R26.2);Pain Pain - Right/Left: Left Pain - part of body: Hip     Time: 5015-8682 PT Time Calculation (min)  (ACUTE ONLY): 28 min  Charges:  $Gait Training: 8-22 mins $Therapeutic Activity: 8-22 mins                     Rica Koyanagi  PTA Acute  Rehabilitation Services Pager      [phone] Office      [phone]

## 2019-03-26 ENCOUNTER — Telehealth: Payer: Self-pay | Admitting: Orthopaedic Surgery

## 2019-03-26 ENCOUNTER — Telehealth: Payer: Self-pay | Admitting: Family Medicine

## 2019-03-26 NOTE — Telephone Encounter (Signed)
Home Health Verbal Orders - Caller/Agency: Lenna Sciara - [facility]  Callback Number: [phone] Requesting OT/PT/Skilled Nursing/Social Work/Speech Therapy: wound care  Frequency: pressure sore on right ear, very red, depression in it, yellow crusty in the depression.   Asking for wound care.

## 2019-03-26 NOTE — Telephone Encounter (Signed)
Verbal order left on VM  

## 2019-03-26 NOTE — Telephone Encounter (Signed)
[place] Warden/ranger with [facility] called in requesting verbal orders for occupational therapy 1 time a week for 1 week, 2 times a week for 2 weeks, and 1 time a week for 1 week.  [phone]

## 2019-03-29 NOTE — Telephone Encounter (Signed)
[place] for wound care. If they are not able to evaluate in next 24 hours we could schedule doxy visit for this.

## 2019-03-29 NOTE — Telephone Encounter (Signed)
I called the number below and spoke with Judy [name].  She stated Judy [name] is not available at this time and they are having problems with their phones.  Judy [name] was informed of the verbal order, info below and stated she will forward this to [facility].

## 2019-04-01 ENCOUNTER — Ambulatory Visit (INDEPENDENT_AMBULATORY_CARE_PROVIDER_SITE_OTHER): Payer: Medicare Other | Admitting: Orthopaedic Surgery

## 2019-04-01 ENCOUNTER — Encounter: Payer: Self-pay | Admitting: Orthopaedic Surgery

## 2019-04-01 ENCOUNTER — Other Ambulatory Visit: Payer: Self-pay

## 2019-04-01 DIAGNOSIS — Z96642 Presence of left artificial hip joint: Secondary | ICD-10-CM

## 2019-04-01 MED ORDER — OXYCODONE HCL 5 MG PO TABS: 5.0000 mg | ORAL_TABLET | Freq: Four times a day (QID) | ORAL | 0 refills | Status: DC | PRN

## 2019-04-01 NOTE — Progress Notes (Signed)
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  She is 13 days out from surgery.  She does have a right hip that will need replacing in the near future.  She is on aspirin twice a day.  She is doing well overall other than severe constipation.  She does need a refill on oxycodone as well.  She is been trying some over-the-counter medications to help with her bowels.  On exam her left hip incision looks good to remove the staples in place Steri-Strips.  He does have a moderate hematoma but nothing to really aspirate.  I remove the staples in place Steri-Strips.  I will have her try sorbitol as a constipation medication.  I did send in some oxycodone for her as well.  All question concerns were answered and addressed.  I will have her stop her aspirin as well.  I would like to see her back in 4 weeks to see how she is doing overall but no x-rays are needed.  We can then start talking about replacing her right hip later this year due to severe avascular crisis in her right hip.

## 2019-04-14 ENCOUNTER — Telehealth: Payer: Self-pay | Admitting: Orthopaedic Surgery

## 2019-04-14 NOTE — Telephone Encounter (Signed)
Verbal order left on VM  

## 2019-04-14 NOTE — Telephone Encounter (Signed)
Jim/[facility]/OT  2week  2 1week  1  frequency to start on 7/20  Please call Clair Gulling @[phone]

## 2019-04-25 ENCOUNTER — Other Ambulatory Visit (INDEPENDENT_AMBULATORY_CARE_PROVIDER_SITE_OTHER): Payer: Self-pay | Admitting: Orthopaedic Surgery

## 2019-04-26 NOTE — Telephone Encounter (Signed)
Ok to rf? 

## 2019-04-28 ENCOUNTER — Ambulatory Visit (INDEPENDENT_AMBULATORY_CARE_PROVIDER_SITE_OTHER): Payer: Medicare Other | Admitting: Orthopaedic Surgery

## 2019-04-28 ENCOUNTER — Other Ambulatory Visit: Payer: Self-pay

## 2019-04-28 DIAGNOSIS — M87051 Idiopathic aseptic necrosis of right femur: Secondary | ICD-10-CM | POA: Insufficient documentation

## 2019-04-28 DIAGNOSIS — Z96642 Presence of left artificial hip joint: Secondary | ICD-10-CM

## 2019-04-28 HISTORY — DX: Idiopathic aseptic necrosis of right femur: M87.051

## 2019-04-28 MED ORDER — METHOCARBAMOL 500 MG PO TABS: 500.0000 mg | ORAL_TABLET | Freq: Four times a day (QID) | ORAL | 0 refills | Status: DC | PRN

## 2019-04-28 MED ORDER — OXYCODONE HCL 5 MG PO TABS: 5.0000 mg | ORAL_TABLET | Freq: Four times a day (QID) | ORAL | 0 refills | Status: DC | PRN

## 2019-04-28 NOTE — Progress Notes (Signed)
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  She is done very well with that hip replacement.  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  There is flattening of that femoral head and I do feel this time is that she to proceed with hip replacement on the right side.  She is doing very well with recovery on the left hip.  On exam I can easily move her left hip around without any issues at all.  The right hip has severe pain with attempts of internal X rotation with severe stiffness as well.  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  At this point we will work on setting her up sometime in the next month or so for a right total hip arthroplasty.  All question concerns were answered and addressed.  She understands fully the rationale behind surgery like this having had it done just 5 and half weeks ago on her left side.  She understands the reasoning for proceeding seen given her femoral head collapse.  She understands the the risk and benefits of surgery and what it involves.  I will send in some more pain medication and muscle relaxants for an interim.  All question concerns were answered and addressed.  We will work on getting surgery scheduled for her right hip.

## 2019-05-24 ENCOUNTER — Other Ambulatory Visit: Payer: Self-pay | Admitting: Physician Assistant

## 2019-05-31 NOTE — Patient Instructions (Addendum)
YOU NEED TO HAVE A COVID 19 TEST ON 06-01-19  @ 2:15 PM, THIS TEST MUST BE DONE BEFORE SURGERY, [address]  [address], [address] , [id number]. ONCE YOUR COVID TEST IS COMPLETED, PLEASE BEGIN THE QUARANTINE INSTRUCTIONS AS OUTLINED IN YOUR HANDOUT.                Judy [name]  05/31/2019   Your procedure is scheduled on: 06-04-19    Report to [facility] Main  Entrance    Report to Admitting at 8:30 AM   1 VISITOR IS ALLOWED TO WAIT IN WAITING ROOM  ONLY DAY OF YOUR SURGERY. NO VISITORS ARE ALLOWED IN SHORT STAY OR RECOVERY ROOM.   Call this number if you have problems the morning of surgery [phone]    Remember: NO SOLID FOOD AFTER MIDNIGHT. NOTHING BY MOUTH EXCEPT CLEAR LIQUIDS . PLEASE FINISH ENSURE DRINK PER SURGEON ORDER  WHICH NEEDS TO BE COMPLETED AT 8:00 AM.   CLEAR LIQUID DIET   Foods Allowed                                                                     Foods Excluded  Coffee and tea, regular and decaf                             liquids that you cannot  Plain Jell-O any favor except red or purple                                           see through such as: Fruit ices (not with fruit pulp)                                     milk, soups, orange juice  Iced Popsicles                                    All solid food Carbonated beverages, regular and diet                                    Cranberry, grape and apple juices Sports drinks like Gatorade Lightly seasoned clear broth or consume(fat free) Sugar, honey syrup  _____________________________________________________________________       Take these medicines the morning of surgery with A SIP OF WATER:None  BRUSH YOUR TEETH MORNING OF SURGERY AND RINSE YOUR MOUTH OUT, NO CHEWING GUM CANDY OR MINTS.                                You may not have any metal on your body including hair pins and              piercings     Do not wear jewelry, make-up, lotions, powders or perfumes,  deodorant  Do not wear nail polish.  Do not shave  48 hours prior to surgery.                Do not bring valuables to the hospital. [facility] IS NOT             RESPONSIBLE   FOR VALUABLES.  Contacts, dentures or bridgework may not be worn into surgery.      Special Instructions: N/A              Please read over the following fact sheets you were given: _____________________________________________________________________   [facility] Pre[facility] Before surgery, you can play an important role.  Because skin is not sterile, your skin needs to be as free of germs as possible.  You can reduce the number of germs on your skin by washing with CHG (chlorahexidine gluconate) soap before surgery.  CHG is an antiseptic cleaner which kills germs and bonds with the skin to continue killing germs even after washing. Please DO NOT use if you have an allergy to CHG or antibacterial soaps.  If your skin becomes reddened/irritated stop using the CHG and inform your nurse when you arrive at Short Stay. Do not shave (including legs and underarms) for at least 48 hours prior to the first CHG shower.  You may shave your face/neck. Please follow these instructions carefully:  1.  Shower with CHG Soap the night before surgery and the  morning of Surgery.  2.  If you choose to wash your hair, wash your hair first as usual with your  normal  shampoo.  3.  After you shampoo, rinse your hair and body thoroughly to remove the  shampoo.                           4.  Use CHG as you would any other liquid soap.  You can apply chg directly  to the skin and wash                       Gently with a scrungie or clean washcloth.  5.  Apply the CHG Soap to your body ONLY FROM THE NECK DOWN.   Do not use on face/ open                           Wound or open sores. Avoid contact with eyes, ears mouth and genitals (private parts).                       Wash face,  Genitals (private parts) with your  normal soap.             6.  Wash thoroughly, paying special attention to the area where your surgery  will be performed.  7.  Thoroughly rinse your body with warm water from the neck down.  8.  DO NOT shower/wash with your normal soap after using and rinsing off  the CHG Soap.                9.  Pat yourself dry with a clean towel.            10.  Wear clean pajamas.            11.  Place clean sheets on your bed the night of your first shower and do not  sleep  with pets. Day of Surgery : Do not apply any lotions/deodorants the morning of surgery.  Please wear clean clothes to the hospital/surgery center.  FAILURE TO FOLLOW THESE INSTRUCTIONS MAY RESULT IN THE CANCELLATION OF YOUR SURGERY PATIENT SIGNATURE_________________________________  NURSE SIGNATURE__________________________________  ________________________________________________________________________

## 2019-05-31 NOTE — Progress Notes (Signed)
PCP - Dr. Ethlyn Gallery Cardiologist -   LOV w/ Pulmonary 08-25-18 (Dr. Marshell Garfinkel)  Chest x-ray -  EKG - 12-11-18  Stress Test -  ECHO -  Cardiac Cath -   Sleep Study -  CPAP -   Fasting Blood Sugar -  Checks Blood Sugar _____ times a day  Blood Thinner Instructions: Aspirin Instructions:  Anesthesia review:   Patient denies shortness of breath, fever, cough and chest pain at PAT appointment   Patient verbalized understanding of instructions that were given to them at the PAT appointment. Patient was also instructed that they will need to review over the PAT instructions again at home before surgery.

## 2019-06-01 ENCOUNTER — Other Ambulatory Visit: Payer: Self-pay

## 2019-06-01 ENCOUNTER — Encounter (INDEPENDENT_AMBULATORY_CARE_PROVIDER_SITE_OTHER): Payer: Self-pay

## 2019-06-01 ENCOUNTER — Encounter (HOSPITAL_COMMUNITY)
Admission: RE | Admit: 2019-06-01 | Discharge: 2019-06-01 | Disposition: A | Discharge status: Home / Self Care | Payer: Medicare Other | Source: Ambulatory Visit | Attending: Orthopaedic Surgery | Admitting: Orthopaedic Surgery

## 2019-06-01 ENCOUNTER — Other Ambulatory Visit (HOSPITAL_COMMUNITY)
Admission: RE | Admit: 2019-06-01 | Discharge: 2019-06-01 | Disposition: A | Discharge status: Home / Self Care | Payer: Medicare Other | Source: Ambulatory Visit | Attending: Orthopaedic Surgery | Admitting: Orthopaedic Surgery

## 2019-06-01 ENCOUNTER — Encounter (HOSPITAL_COMMUNITY): Payer: Self-pay

## 2019-06-01 DIAGNOSIS — Z20828 Contact with and (suspected) exposure to other viral communicable diseases: Secondary | ICD-10-CM | POA: Diagnosis not present

## 2019-06-01 DIAGNOSIS — M879 Osteonecrosis, unspecified: Secondary | ICD-10-CM | POA: Diagnosis not present

## 2019-06-01 DIAGNOSIS — Z01812 Encounter for preprocedural laboratory examination: Secondary | ICD-10-CM | POA: Insufficient documentation

## 2019-06-01 LAB — CBC
HCT: 39.6 % (ref 36.0–46.0)
Hemoglobin: 12.6 g/dL (ref 12.0–15.0)
MCH: 28.7 pg (ref 26.0–34.0)
MCHC: 31.8 g/dL (ref 30.0–36.0)
MCV: 90.2 fL (ref 80.0–100.0)
Platelets: 251 10*3/uL (ref 150–400)
RBC: 4.39 MIL/uL (ref 3.87–5.11)
RDW: 14 % (ref 11.5–15.5)
WBC: 6.2 10*3/uL (ref 4.0–10.5)
nRBC: 0 % (ref 0.0–0.2)

## 2019-06-01 LAB — BASIC METABOLIC PANEL
Anion gap: 9 (ref 5–15)
BUN: 9 mg/dL (ref 8–23)
CO2: 24 mmol/L (ref 22–32)
Calcium: 9.6 mg/dL (ref 8.9–10.3)
Chloride: 106 mmol/L (ref 98–111)
Creatinine, Ser: 0.8 mg/dL (ref 0.44–1.00)
GFR calc Af Amer: >60 mL/min (ref >60)
GFR calc non Af Amer: >60 mL/min (ref >60)
Glucose, Bld: 102 mg/dL — ABNORMAL HIGH (ref 70–99)
Potassium: 4.3 mmol/L (ref 3.5–5.1)
Sodium: 139 mmol/L (ref 135–145)

## 2019-06-01 LAB — SURGICAL PCR SCREEN
MRSA, PCR: NEGATIVE
Staphylococcus aureus: NEGATIVE

## 2019-06-01 LAB — SARS CORONAVIRUS 2 (TAT 6-24 HRS): SARS Coronavirus 2: NEGATIVE

## 2019-06-04 ENCOUNTER — Inpatient Hospital Stay (HOSPITAL_COMMUNITY)
Admission: RE | Admit: 2019-06-04 | Discharge: 2019-06-06 | DRG: 470 | Disposition: A | Discharge status: Home Health | Payer: Medicare Other | Attending: Orthopaedic Surgery | Admitting: Orthopaedic Surgery

## 2019-06-04 ENCOUNTER — Ambulatory Visit (HOSPITAL_COMMUNITY): Payer: Medicare Other

## 2019-06-04 ENCOUNTER — Other Ambulatory Visit: Payer: Self-pay

## 2019-06-04 ENCOUNTER — Encounter (HOSPITAL_COMMUNITY)
Admission: RE | Disposition: A | Discharge status: Home Health | Payer: Self-pay | Source: Home / Self Care | Attending: Orthopaedic Surgery

## 2019-06-04 ENCOUNTER — Ambulatory Visit (HOSPITAL_COMMUNITY): Payer: Medicare Other | Admitting: Certified Registered"

## 2019-06-04 ENCOUNTER — Ambulatory Visit (HOSPITAL_COMMUNITY): Payer: Medicare Other | Admitting: Physician Assistant

## 2019-06-04 ENCOUNTER — Observation Stay (HOSPITAL_COMMUNITY): Payer: Medicare Other

## 2019-06-04 ENCOUNTER — Encounter (HOSPITAL_COMMUNITY): Payer: Self-pay | Admitting: *Deleted

## 2019-06-04 DIAGNOSIS — Z79899 Other long term (current) drug therapy: Secondary | ICD-10-CM

## 2019-06-04 DIAGNOSIS — Z888 Allergy status to other drugs, medicaments and biological substances status: Secondary | ICD-10-CM

## 2019-06-04 DIAGNOSIS — M87051 Idiopathic aseptic necrosis of right femur: Secondary | ICD-10-CM | POA: Diagnosis not present

## 2019-06-04 DIAGNOSIS — Z8249 Family history of ischemic heart disease and other diseases of the circulatory system: Secondary | ICD-10-CM

## 2019-06-04 DIAGNOSIS — F4310 Post-traumatic stress disorder, unspecified: Secondary | ICD-10-CM | POA: Diagnosis present

## 2019-06-04 DIAGNOSIS — F319 Bipolar disorder, unspecified: Secondary | ICD-10-CM | POA: Diagnosis present

## 2019-06-04 DIAGNOSIS — M879 Osteonecrosis, unspecified: Principal | ICD-10-CM | POA: Diagnosis present

## 2019-06-04 DIAGNOSIS — F17210 Nicotine dependence, cigarettes, uncomplicated: Secondary | ICD-10-CM | POA: Diagnosis present

## 2019-06-04 DIAGNOSIS — Z825 Family history of asthma and other chronic lower respiratory diseases: Secondary | ICD-10-CM

## 2019-06-04 DIAGNOSIS — M21751 Unequal limb length (acquired), right femur: Secondary | ICD-10-CM | POA: Diagnosis present

## 2019-06-04 DIAGNOSIS — Z419 Encounter for procedure for purposes other than remedying health state, unspecified: Secondary | ICD-10-CM

## 2019-06-04 DIAGNOSIS — Z96642 Presence of left artificial hip joint: Secondary | ICD-10-CM | POA: Diagnosis present

## 2019-06-04 DIAGNOSIS — F419 Anxiety disorder, unspecified: Secondary | ICD-10-CM | POA: Diagnosis present

## 2019-06-04 DIAGNOSIS — J449 Chronic obstructive pulmonary disease, unspecified: Secondary | ICD-10-CM | POA: Diagnosis present

## 2019-06-04 DIAGNOSIS — M25451 Effusion, right hip: Secondary | ICD-10-CM | POA: Diagnosis present

## 2019-06-04 DIAGNOSIS — Z96641 Presence of right artificial hip joint: Secondary | ICD-10-CM

## 2019-06-04 HISTORY — PX: TOTAL HIP ARTHROPLASTY: SHX124

## 2019-06-04 IMAGING — RF OPERATIVE RIGHT HIP WITH PELVIS
1 series · 2 of 2 positions shown · non-contrast
Comparison: RADIOGRAPH DATED [DATE]

CLINICAL DATA: Avascular necrosis of the right femoral head.

EXAM:
OPERATIVE RIGHT HIP WITH PELVIS; DG C-ARM 1-60 MIN-NO REPORT

[Series 1: run · 2 of 2 slices shown]
[im 1/2]
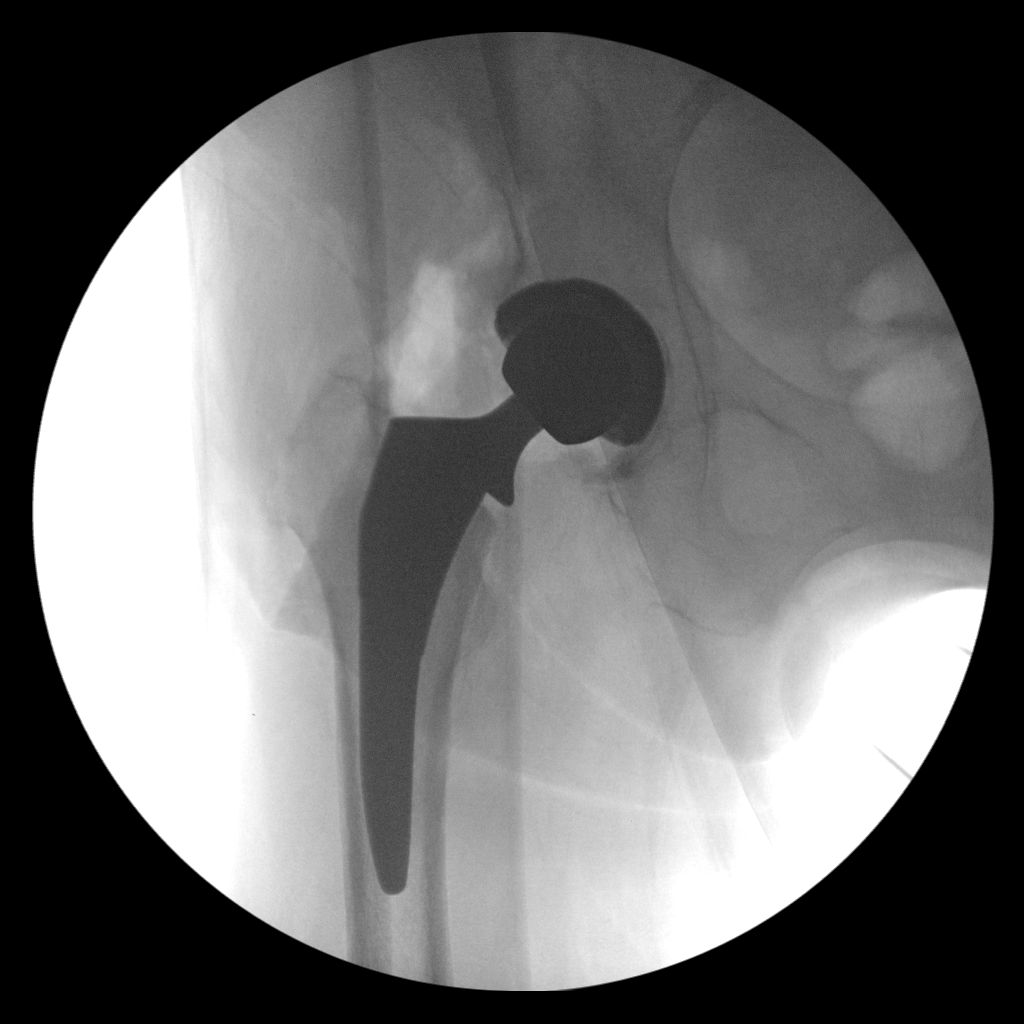
[im 2/2]
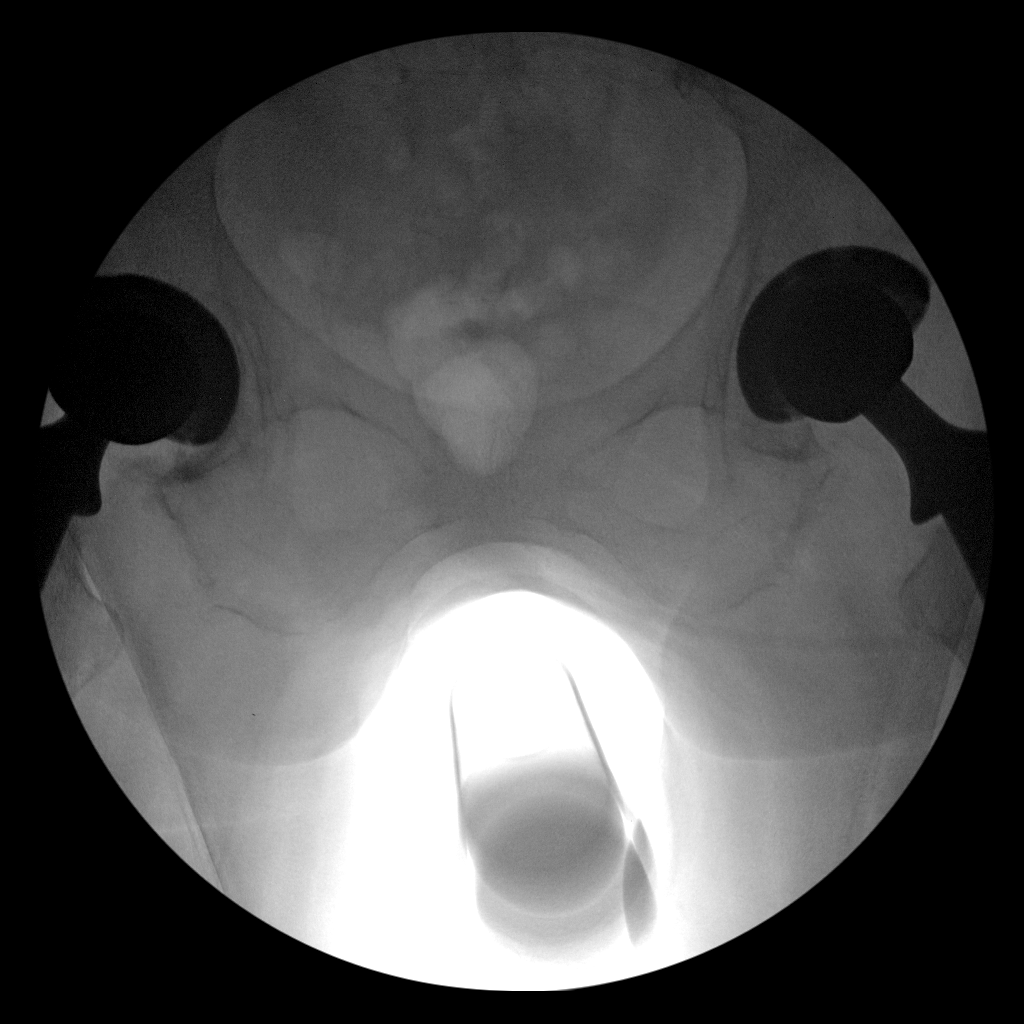

[2 of 2 positions shown; findings below may reference images not displayed]

FINDINGS: AP views of the pelvis and right hip demonstrate that the acetabular
and femoral components of the new right total hip prosthesis appear
in excellent position in the AP projection. No fractures.
IMPRESSION: Satisfactory appearance of the right hip in the AP projection after
total hip prosthesis insertion.

## 2019-06-04 IMAGING — DX PORTABLE PELVIS 1-2 VIEWS
2 series · 2 of 2 positions shown · non-contrast
Comparison: Intraoperative x-ray earlier same day at [DATE] p.m. AP
pelvis x-rays [DATE].

CLINICAL DATA: Postop day 0 ANTERIOR approach RIGHT total hip
arthroplasty.

EXAM:
PORTABLE PELVIS 1-2 VIEWS [DATE] p.m.:

[pelvis ap (1 of 2)]
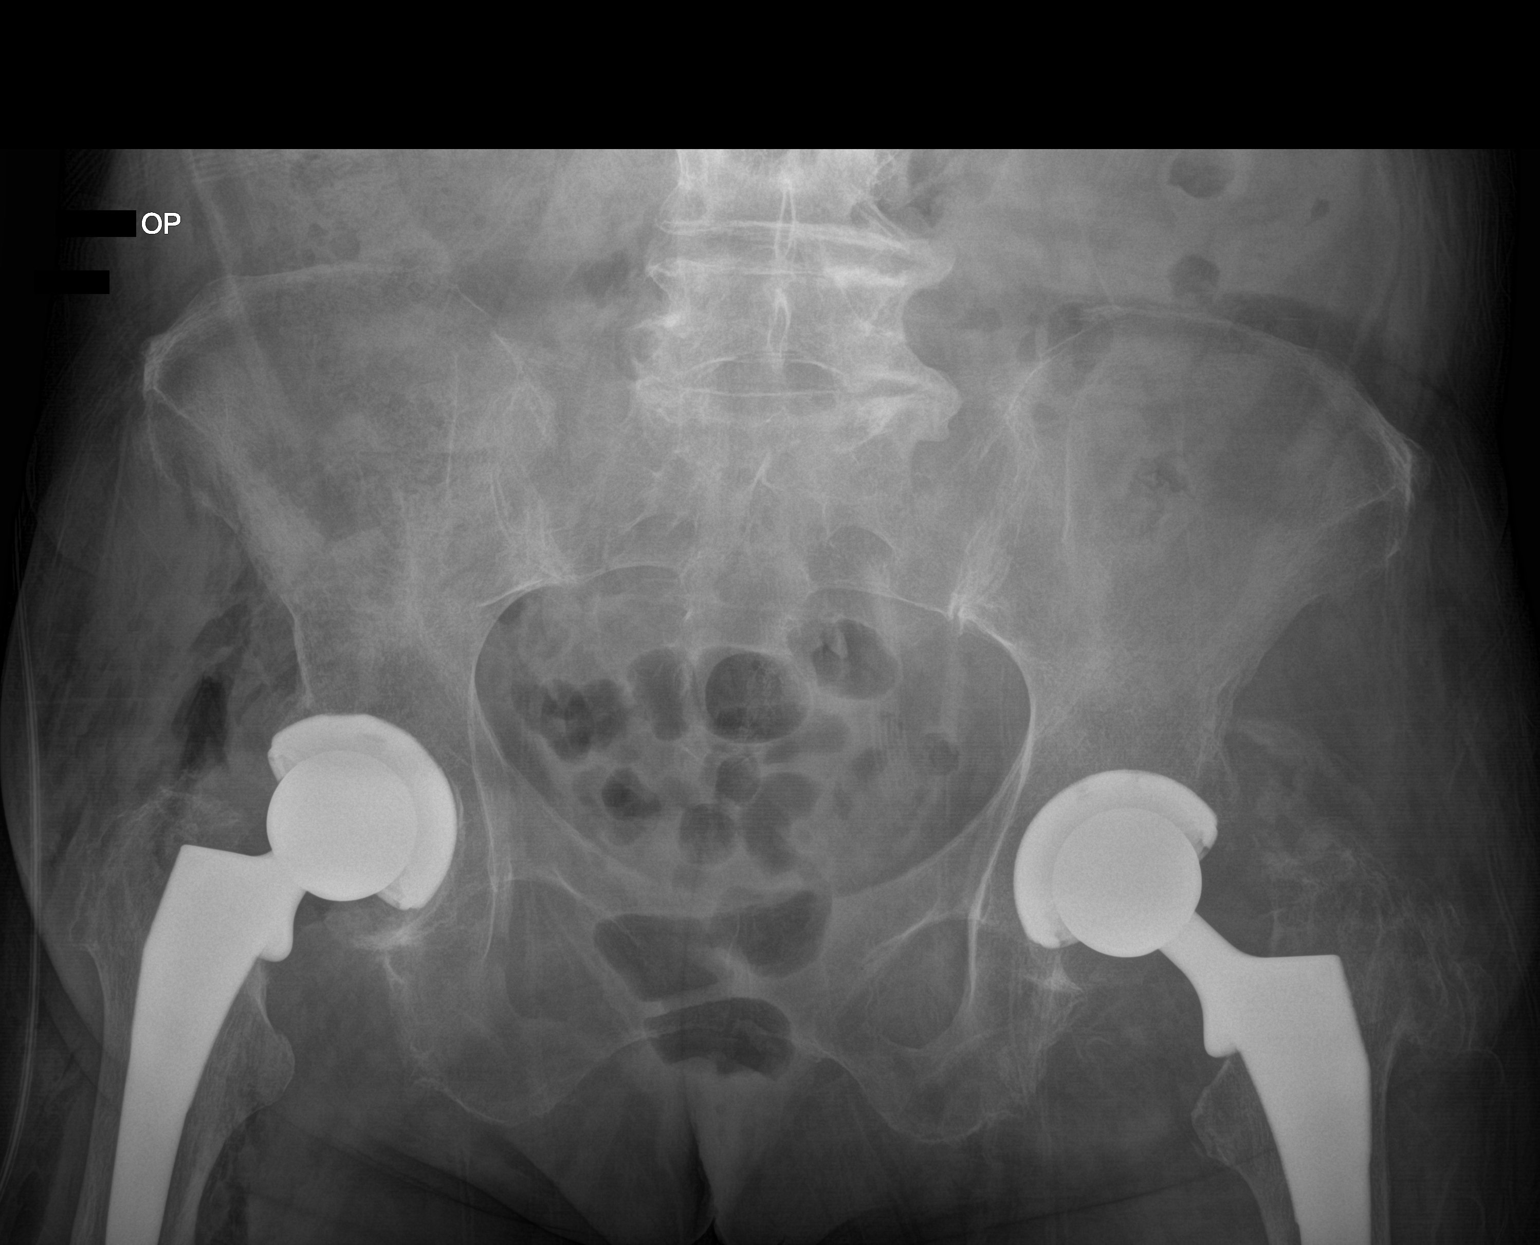

[pelvis ap (2 of 2)]
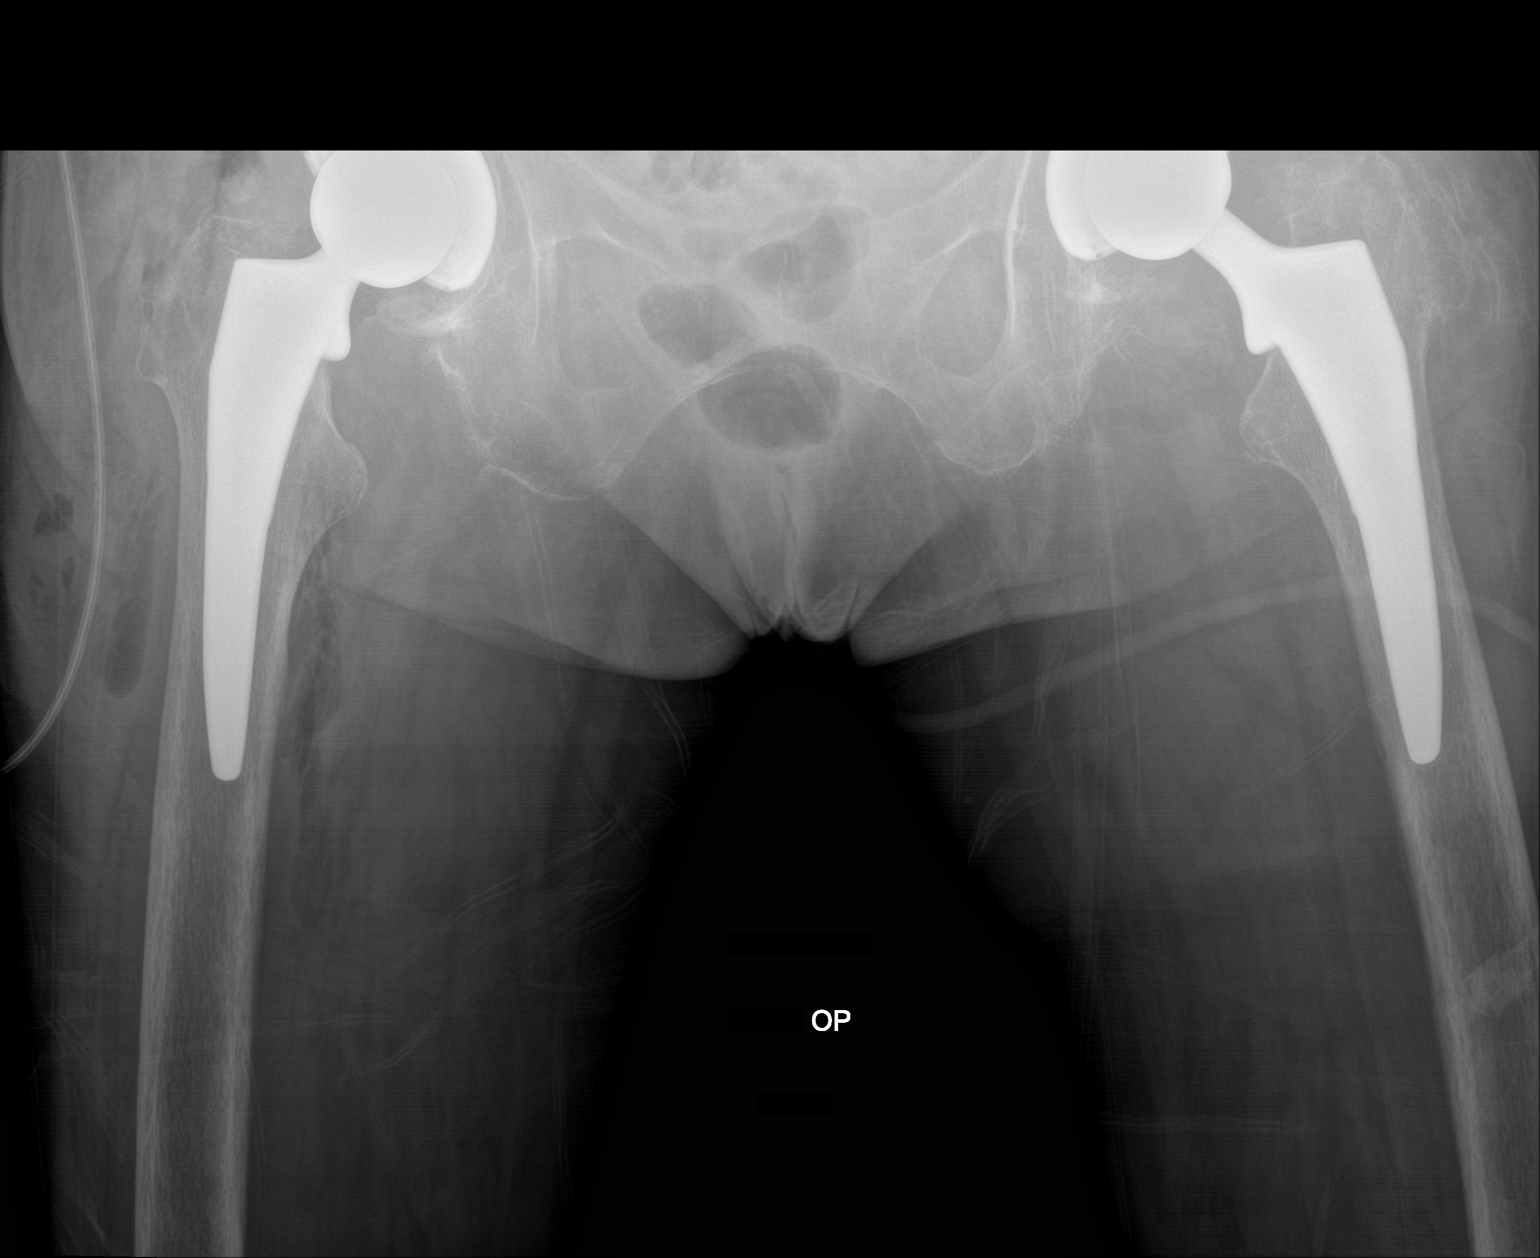

[2 of 2 positions shown; findings below may reference images not displayed]

FINDINGS: Anatomic alignment in the AP projection post RIGHT total hip
arthroplasty. No acute complicating features. Osseous
demineralization. Degenerative changes involving the visualized
LOWER lumbar spine.

The LEFT total hip arthroplasty is anatomically aligned without
complicating features.
IMPRESSION: Anatomic alignment in the AP projection post RIGHT total hip
arthroplasty without acute complicating features.

## 2019-06-04 IMAGING — RF DG C-ARM 1-60 MIN-NO REPORT
1 series · 2 of 2 positions shown · non-contrast
Comparison: RADIOGRAPH DATED [DATE]

CLINICAL DATA: Avascular necrosis of the right femoral head.

EXAM:
OPERATIVE RIGHT HIP WITH PELVIS; DG C-ARM 1-60 MIN-NO REPORT

[Series 1: run · 2 of 2 slices shown]
[im 1/2]
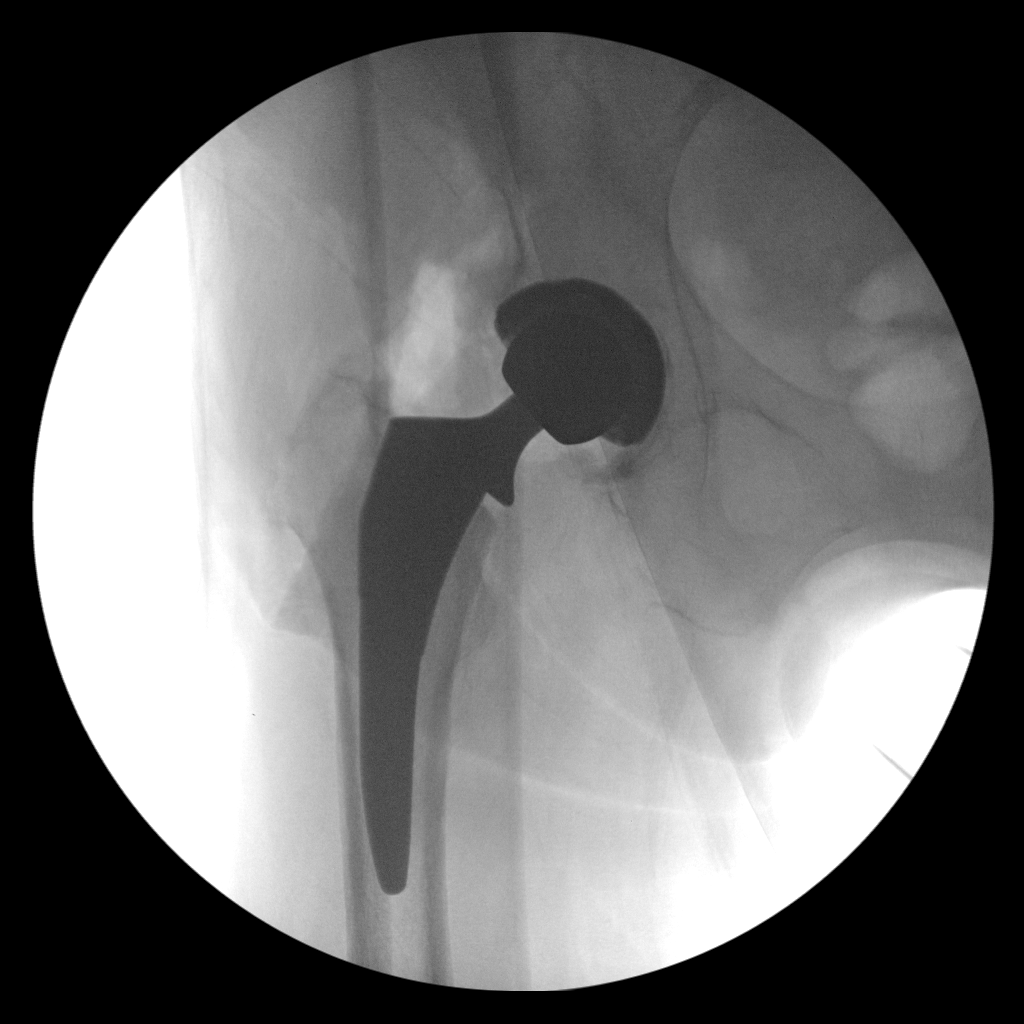
[im 2/2]
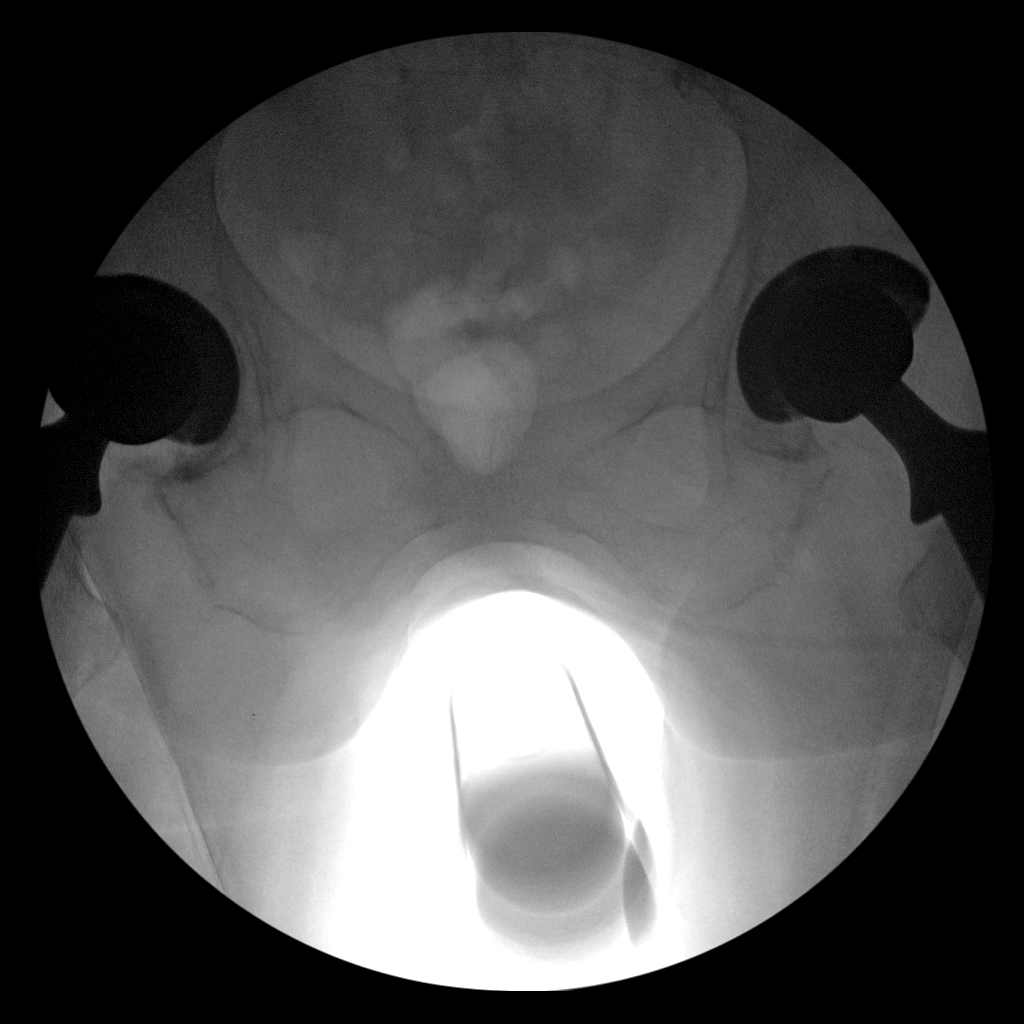

[2 of 2 positions shown; findings below may reference images not displayed]

FINDINGS: AP views of the pelvis and right hip demonstrate that the acetabular
and femoral components of the new right total hip prosthesis appear
in excellent position in the AP projection. No fractures.
IMPRESSION: Satisfactory appearance of the right hip in the AP projection after
total hip prosthesis insertion.

## 2019-06-04 SURGERY — ARTHROPLASTY, HIP, TOTAL, ANTERIOR APPROACH
Anesthesia: Spinal | Laterality: Right

## 2019-06-04 MED ORDER — LACTATED RINGERS IV SOLN
INTRAVENOUS | Status: DC
  Administered 2019-06-04 (×2): via INTRAVENOUS

## 2019-06-04 MED ORDER — EPHEDRINE 5 MG/ML INJ
INTRAVENOUS | Status: AC
  Filled 2019-06-04: qty 10

## 2019-06-04 MED ORDER — CEFAZOLIN SODIUM-DEXTROSE 1-4 GM/50ML-% IV SOLN
1.0000 g | Freq: Four times a day (QID) | INTRAVENOUS | Status: AC
  Administered 2019-06-04 (×2): 1 g via INTRAVENOUS
  Filled 2019-06-04 (×2): qty 50

## 2019-06-04 MED ORDER — DOCUSATE SODIUM 100 MG PO CAPS
100.0000 mg | ORAL_CAPSULE | Freq: Two times a day (BID) | ORAL | Status: DC
  Administered 2019-06-04 – 2019-06-06 (×4): 100 mg via ORAL
  Filled 2019-06-04 (×4): qty 1

## 2019-06-04 MED ORDER — PANTOPRAZOLE SODIUM 40 MG PO TBEC
40.0000 mg | DELAYED_RELEASE_TABLET | Freq: Every day | ORAL | Status: DC
  Administered 2019-06-04 – 2019-06-06 (×3): 40 mg via ORAL
  Filled 2019-06-04 (×3): qty 1

## 2019-06-04 MED ORDER — MIDAZOLAM HCL 2 MG/2ML IJ SOLN
INTRAMUSCULAR | Status: AC
  Filled 2019-06-04: qty 2

## 2019-06-04 MED ORDER — FENTANYL CITRATE (PF) 100 MCG/2ML IJ SOLN
INTRAMUSCULAR | Status: AC
  Filled 2019-06-04: qty 2

## 2019-06-04 MED ORDER — BUPIVACAINE IN DEXTROSE 0.75-8.25 % IT SOLN
INTRATHECAL | Status: DC | PRN
  Administered 2019-06-04: 1.8 mL via INTRATHECAL

## 2019-06-04 MED ORDER — FENTANYL CITRATE (PF) 100 MCG/2ML IJ SOLN
INTRAMUSCULAR | Status: DC | PRN
  Administered 2019-06-04: 50 ug via INTRAVENOUS

## 2019-06-04 MED ORDER — LORAZEPAM 1 MG PO TABS
1.5000 mg | ORAL_TABLET | Freq: Every day | ORAL | Status: DC
  Administered 2019-06-04 – 2019-06-05 (×2): 1.5 mg via ORAL
  Filled 2019-06-04 (×2): qty 1

## 2019-06-04 MED ORDER — PROPOFOL 500 MG/50ML IV EMUL
INTRAVENOUS | Status: DC | PRN
  Administered 2019-06-04: 25 ug/kg/min via INTRAVENOUS

## 2019-06-04 MED ORDER — CEFAZOLIN SODIUM-DEXTROSE 2-4 GM/100ML-% IV SOLN
2.0000 g | INTRAVENOUS | Status: AC
  Administered 2019-06-04: 2 g via INTRAVENOUS
  Filled 2019-06-04: qty 100

## 2019-06-04 MED ORDER — ONDANSETRON HCL 4 MG PO TABS: 4.0000 mg | ORAL_TABLET | Freq: Four times a day (QID) | ORAL | Status: DC | PRN

## 2019-06-04 MED ORDER — ONDANSETRON HCL 4 MG/2ML IJ SOLN: 4.0000 mg | Freq: Four times a day (QID) | INTRAMUSCULAR | Status: DC | PRN

## 2019-06-04 MED ORDER — ASPIRIN 81 MG PO CHEW
81.0000 mg | CHEWABLE_TABLET | Freq: Two times a day (BID) | ORAL | Status: DC
  Administered 2019-06-04 – 2019-06-06 (×4): 81 mg via ORAL
  Filled 2019-06-04 (×4): qty 1

## 2019-06-04 MED ORDER — PHENOL 1.4 % MT LIQD: 1.0000 | OROMUCOSAL | Status: DC | PRN

## 2019-06-04 MED ORDER — ACETAMINOPHEN 325 MG PO TABS
325.0000 mg | ORAL_TABLET | Freq: Four times a day (QID) | ORAL | Status: DC | PRN
  Administered 2019-06-05 (×2): 650 mg via ORAL
  Filled 2019-06-04 (×3): qty 2

## 2019-06-04 MED ORDER — POLYETHYLENE GLYCOL 3350 17 G PO PACK: 17.0000 g | PACK | Freq: Every day | ORAL | Status: DC | PRN

## 2019-06-04 MED ORDER — ACETAMINOPHEN 10 MG/ML IV SOLN
INTRAVENOUS | Status: AC
  Filled 2019-06-04: qty 100

## 2019-06-04 MED ORDER — MIDAZOLAM HCL 5 MG/5ML IJ SOLN
INTRAMUSCULAR | Status: DC | PRN
  Administered 2019-06-04: 2 mg via INTRAVENOUS

## 2019-06-04 MED ORDER — SODIUM CHLORIDE 0.9 % IV SOLN: INTRAVENOUS | Status: DC

## 2019-06-04 MED ORDER — SODIUM CHLORIDE 0.9 % IV SOLN
INTRAVENOUS | Status: DC | PRN
  Administered 2019-06-04: 30 ug/min via INTRAVENOUS

## 2019-06-04 MED ORDER — METOCLOPRAMIDE HCL 5 MG PO TABS: 5.0000 mg | ORAL_TABLET | Freq: Three times a day (TID) | ORAL | Status: DC | PRN

## 2019-06-04 MED ORDER — DIPHENHYDRAMINE HCL 12.5 MG/5ML PO ELIX: 12.5000 mg | ORAL_SOLUTION | ORAL | Status: DC | PRN

## 2019-06-04 MED ORDER — METHOCARBAMOL 500 MG PO TABS
500.0000 mg | ORAL_TABLET | Freq: Four times a day (QID) | ORAL | Status: DC | PRN
  Administered 2019-06-05 – 2019-06-06 (×3): 500 mg via ORAL
  Filled 2019-06-04 (×3): qty 1

## 2019-06-04 MED ORDER — ONDANSETRON HCL 4 MG/2ML IJ SOLN
INTRAMUSCULAR | Status: DC | PRN
  Administered 2019-06-04: 4 mg via INTRAVENOUS

## 2019-06-04 MED ORDER — POVIDONE-IODINE 10 % EX SWAB
2.0000 "application " | Freq: Once | CUTANEOUS | Status: AC
  Administered 2019-06-04: 2 via TOPICAL

## 2019-06-04 MED ORDER — SODIUM CHLORIDE 0.9 % IV SOLN: INTRAVENOUS | Status: DC | PRN

## 2019-06-04 MED ORDER — TRANEXAMIC ACID-NACL 1000-0.7 MG/100ML-% IV SOLN
1000.0000 mg | INTRAVENOUS | Status: AC
  Administered 2019-06-04: 1000 mg via INTRAVENOUS
  Filled 2019-06-04: qty 100

## 2019-06-04 MED ORDER — PHENYLEPHRINE 40 MCG/ML (10ML) SYRINGE FOR IV PUSH (FOR BLOOD PRESSURE SUPPORT)
PREFILLED_SYRINGE | INTRAVENOUS | Status: DC | PRN
  Administered 2019-06-04: 80 ug via INTRAVENOUS
  Administered 2019-06-04: 120 ug via INTRAVENOUS
  Administered 2019-06-04 (×2): 80 ug via INTRAVENOUS

## 2019-06-04 MED ORDER — SODIUM CHLORIDE 0.9 % IR SOLN
Status: DC | PRN
  Administered 2019-06-04: 1000 mL

## 2019-06-04 MED ORDER — HYDROMORPHONE HCL 1 MG/ML IJ SOLN: 0.5000 mg | INTRAMUSCULAR | Status: DC | PRN

## 2019-06-04 MED ORDER — METHOCARBAMOL 500 MG IVPB - SIMPLE MED
500.0000 mg | Freq: Four times a day (QID) | INTRAVENOUS | Status: DC | PRN
  Administered 2019-06-05: 500 mg via INTRAVENOUS
  Filled 2019-06-04: qty 500
  Filled 2019-06-04: qty 50

## 2019-06-04 MED ORDER — EPHEDRINE SULFATE-NACL 50-0.9 MG/10ML-% IV SOSY
PREFILLED_SYRINGE | INTRAVENOUS | Status: DC | PRN
  Administered 2019-06-04: 5 mg via INTRAVENOUS

## 2019-06-04 MED ORDER — MENTHOL 3 MG MT LOZG: 1.0000 | LOZENGE | OROMUCOSAL | Status: DC | PRN

## 2019-06-04 MED ORDER — ALUM & MAG HYDROXIDE-SIMETH 200-200-20 MG/5ML PO SUSP: 30.0000 mL | ORAL | Status: DC | PRN

## 2019-06-04 MED ORDER — OXYCODONE HCL 5 MG PO TABS
5.0000 mg | ORAL_TABLET | ORAL | Status: DC | PRN
  Administered 2019-06-05: 07:00:00 5 mg via ORAL
  Filled 2019-06-04: qty 1
  Filled 2019-06-04 (×3): qty 2

## 2019-06-04 MED ORDER — DEXAMETHASONE SODIUM PHOSPHATE 10 MG/ML IJ SOLN
INTRAMUSCULAR | Status: DC | PRN
  Administered 2019-06-04: 10 mg via INTRAVENOUS

## 2019-06-04 MED ORDER — OXYCODONE HCL 5 MG PO TABS
10.0000 mg | ORAL_TABLET | ORAL | Status: DC | PRN
  Administered 2019-06-04 – 2019-06-06 (×7): 10 mg via ORAL
  Filled 2019-06-04 (×4): qty 2

## 2019-06-04 MED ORDER — CARIPRAZINE HCL 1.5 MG PO CAPS
3.0000 mg | ORAL_CAPSULE | Freq: Every day | ORAL | Status: DC
  Administered 2019-06-04 – 2019-06-06 (×3): 3 mg via ORAL
  Filled 2019-06-04 (×3): qty 2

## 2019-06-04 MED ORDER — METOCLOPRAMIDE HCL 5 MG/ML IJ SOLN: 5.0000 mg | Freq: Three times a day (TID) | INTRAMUSCULAR | Status: DC | PRN

## 2019-06-04 MED ORDER — QUETIAPINE FUMARATE 300 MG PO TABS
800.0000 mg | ORAL_TABLET | Freq: Every day | ORAL | Status: DC
  Administered 2019-06-04 – 2019-06-05 (×2): 800 mg via ORAL
  Filled 2019-06-04 (×3): qty 1

## 2019-06-04 MED ORDER — ACETAMINOPHEN 10 MG/ML IV SOLN
INTRAVENOUS | Status: DC | PRN
  Administered 2019-06-04: 1000 mg via INTRAVENOUS

## 2019-06-04 MED ORDER — LURASIDONE HCL 40 MG PO TABS
80.0000 mg | ORAL_TABLET | Freq: Every evening | ORAL | Status: DC
  Administered 2019-06-04 – 2019-06-05 (×2): 80 mg via ORAL
  Filled 2019-06-04 (×3): qty 2

## 2019-06-04 MED ORDER — CHLORHEXIDINE GLUCONATE 4 % EX LIQD: 60.0000 mL | Freq: Once | CUTANEOUS | Status: DC

## 2019-06-04 SURGICAL SUPPLY — 42 items
ACETAB CUP W/GRIPTION 54 (Plate) ×2 IMPLANT
BAG ZIPLOCK 12X15 (MISCELLANEOUS) IMPLANT
BENZOIN TINCTURE PRP APPL 2/3 (GAUZE/BANDAGES/DRESSINGS) ×1 IMPLANT
BLADE SAW SGTL 18X1.27X75 (BLADE) ×2 IMPLANT
BLADE SURG SZ10 CARB STEEL (BLADE) ×4 IMPLANT
COVER PERINEAL POST (MISCELLANEOUS) ×2 IMPLANT
COVER SURGICAL LIGHT HANDLE (MISCELLANEOUS) ×2 IMPLANT
COVER WAND RF STERILE (DRAPES) IMPLANT
CUP ACETAB W/GRIPTION 54 (Plate) IMPLANT
DRAPE STERI IOBAN 125X83 (DRAPES) ×2 IMPLANT
DRAPE U-SHAPE 47X51 STRL (DRAPES) ×4 IMPLANT
DRESSING AQUACEL AG SP 3.5X10 (GAUZE/BANDAGES/DRESSINGS) IMPLANT
DRSG AQUACEL AG ADV 3.5X10 (GAUZE/BANDAGES/DRESSINGS) ×2 IMPLANT
DRSG AQUACEL AG SP 3.5X10 (GAUZE/BANDAGES/DRESSINGS) ×2
DURAPREP 26ML APPLICATOR (WOUND CARE) ×2 IMPLANT
ELECT REM PT RETURN 15FT ADLT (MISCELLANEOUS) ×2 IMPLANT
GAUZE XEROFORM 1X8 LF (GAUZE/BANDAGES/DRESSINGS) ×2 IMPLANT
GLOVE BIO SURGEON STRL SZ7.5 (GLOVE) ×2 IMPLANT
GLOVE BIOGEL PI IND STRL 8 (GLOVE) ×2 IMPLANT
GLOVE BIOGEL PI INDICATOR 8 (GLOVE) ×2
GLOVE ECLIPSE 8.0 STRL XLNG CF (GLOVE) ×2 IMPLANT
GOWN STRL REUS W/TWL XL LVL3 (GOWN DISPOSABLE) ×4 IMPLANT
HANDPIECE INTERPULSE COAX TIP (DISPOSABLE) ×1
HEAD M SROM 36MM PLUS 1.5 (Hips) IMPLANT
HOLDER FOLEY CATH W/STRAP (MISCELLANEOUS) ×2 IMPLANT
KIT TURNOVER KIT A (KITS) IMPLANT
LINER NEUTRAL 36ID 54OD (Liner) ×1 IMPLANT
PACK ANTERIOR HIP CUSTOM (KITS) ×2 IMPLANT
SET HNDPC FAN SPRY TIP SCT (DISPOSABLE) ×1 IMPLANT
SROM M HEAD 36MM PLUS 1.5 (Hips) ×2 IMPLANT
STAPLER VISISTAT 35W (STAPLE) IMPLANT
STEM FEM ACTIS HIGH SZ7 (Stem) ×1 IMPLANT
STRIP CLOSURE SKIN 1/2X4 (GAUZE/BANDAGES/DRESSINGS) ×1 IMPLANT
SUT ETHIBOND NAB CT1 #1 30IN (SUTURE) ×2 IMPLANT
SUT ETHILON 2 0 PS N (SUTURE) IMPLANT
SUT MNCRL AB 4-0 PS2 18 (SUTURE) ×1 IMPLANT
SUT VIC AB 0 CT1 36 (SUTURE) ×2 IMPLANT
SUT VIC AB 1 CT1 36 (SUTURE) ×2 IMPLANT
SUT VIC AB 2-0 CT1 27 (SUTURE) ×2
SUT VIC AB 2-0 CT1 TAPERPNT 27 (SUTURE) ×2 IMPLANT
TRAY FOLEY MTR SLVR 16FR STAT (SET/KITS/TRAYS/PACK) ×2 IMPLANT
YANKAUER SUCT BULB TIP 10FT TU (MISCELLANEOUS) ×2 IMPLANT

## 2019-06-04 NOTE — Op Note (Signed)
NAME: Judy [name], MOREE MEDICAL RECORD VZ:[id number] ACCOUNT [id number] DATE OF BIRTH:May 26, 1951 FACILITY: [facility] LOCATION: [facility] PHYSICIAN:Pate Aylward Kerry Fort, MD  OPERATIVE REPORT  DATE OF PROCEDURE:  06/04/2019  PREOPERATIVE DIAGNOSIS:  End-stage avascular necrosis, right hip, with femoral head flattening and collapse.  POSTOPERATIVE DIAGNOSIS:  End-stage avascular necrosis, right hip, with femoral head flattening and collapse.  PROCEDURE:  Right total hip arthroplasty, direct anterior approach.  IMPLANTS:  DePuy Sector Gription acetabular component size 54, size 36+0 neutral polyethylene liner, size 7 Actis femoral component with high offset, size 36+1.5 metal hip ball.  SURGEON:  Jonn Shingles, MD  ASSISTANT:  Erskine Emery, PA-C.  ANESTHESIA:  Spinal.  ANTIBIOTICS:  Two grams IV Ancef.  ESTIMATED BLOOD LOSS:  125 mL.  COMPLICATIONS:  None.  INDICATIONS:  The patient is a 68 year old female well known to me.  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  Her right hip was quite severe as  well, but she only wanted [place] to do one hip at that time, which I was fine with.  Now, her right hip is causing debilitating pain.  She has recovered well from her left hip.  Her right hip shows obvious cystic change in the femoral head and flattening of  the femoral head and evidence of femoral head collapse.  At this point, her pain is daily and she does wish to proceed with total hip arthroplasty and I have recommended this as well.  She understands fully the risk of acute blood loss anemia, nerve or  vessel injury, fracture, infection, dislocation, DVT and implant failure.  She understands our goals are for decreased pain, improve mobility and overall improve quality of life.  DESCRIPTION OF PROCEDURE:  After informed consent was obtained and appropriate right hip was  marked, she was brought to the operating room and sat up on her stretcher and spinal anesthesia was obtained.  She was then laid supine on the stretcher.  I was  able to get a good idea of her leg length and she actually is just slightly short on the right compared to her left operative hip from previous.  Her right hip that we are doing today is definitely a little shorter on that leg length.  I placed traction  boots on both her feet and then I placed her supine on the operative table with a perineal post in place and both legs in line skeletal traction device and no traction applied.  Again, I assessed her left and right hips radiographically, so we can get a  good idea of leg length and offset.  She is definitely starting off short, so we hope it will lengthen her.  Her right hip was then prepped and draped with DuraPrep and sterile drapes.  A time-out was called and she was identified as correct patient,  correct right hip.  We then made an incision just inferior and posterior to the anterior superior iliac spine and carried this obliquely down the leg.  We dissected down to tensor fascia lata muscle.  Tensor fascia was then divided longitudinally to  proceed with direct anterior approach to the hip.  We identified and cauterized circumflex vessels and identified the hip capsule, opened the hip capsule finding a large joint effusion and significant cartilage breaking off from the hip joint itself.  I  placed Cobra retractors around the medial and lateral femoral neck  and made our femoral neck cut with an oscillating saw just proximal to the lesser trochanter.  We completed this with an osteotome.  We then placed a corkscrew guide in the femoral head  and both femoral heads entirety and found a wide area of cartilage just really breaking off and where the head had actually had a subchondral fracture.  We then cleaned the acetabulum of remnants and acetabular labrum and other debris.  I placed a bent   Hohmann over the medial acetabular rim and then began reaming from a size 44 reamer in stepwise increments up to a size 53 with all reamers under direct visualization, the last reamer was placed under direct fluoroscopy, so I could obtain my depth of  reaming by inclination and anteversion.  I then placed the real DePuy Sector Gription acetabular component size 54 and a 36+0 neutral polyethylene liner for that size acetabular component.  Attention was then turned to the femur.  With the leg externally  rotated to 120 degrees, extended and adducted, I was able to place a Mueller retractor medially and a Hohmann retractor around the greater trochanter.  We released lateral joint capsule and used a box-cutting osteotome to enter the femoral canal and a  rongeur to lateralize.  I then began broaching using the Actis broaching system from the starter broach and then to 0 all the way up in 1 mm increments to a size 7.  The size 7 had a nice tight fit to it, so we tried a high offset femoral neck based off  our preoperative film anatomy and a 36+1.5 trial hip ball, reduced this in the acetabulum and we were pleased with leg length, offset, range of motion and stability assessed clinically and radiographically.  We then dislocated the hip and removed the  trial components.  I placed the real Actis femoral component with high offset size 7 and the real 36+1.5 metal hip ball and again reduced this in the acetabulum and appreciated the stability.  We then irrigated the soft tissue with normal saline solution  using pulsatile lavage.  I closed the joint capsule with interrupted #1 Ethibond suture.  The tensor fascia was closed with #1 Vicryl followed by closing the deep tissue was 0 Vicryl, the subcutaneous tissue with 2-0 Vicryl and 4-0 Monocryl subcuticular  stitch.  Steri-Strips were applied to the skin as well as an Aquacel dressing.  She was taken off the Hana table and taken to recovery room in stable  condition.  All final counts were correct.  There were no complications noted.  Of note, Rexene EdisonGil Clark, PA-C did assist during the entire case.  His assistance was crucial for facilitating  all aspects of this case.  TN/NUANCE  D:06/04/2019 T:06/04/2019 JOB:[id number]

## 2019-06-04 NOTE — Anesthesia Preprocedure Evaluation (Addendum)
Anesthesia Evaluation  Patient identified by MRN, date of birth, ID band Patient awake    Reviewed: Allergy & Precautions, NPO status , Patient's Chart, lab work & pertinent test results  Airway Mallampati: I  TM Distance: >3 FB Neck ROM: Full    Dental  (+) Teeth Intact, Dental Advisory Given   Pulmonary COPD, Current Smoker,    breath sounds clear to auscultation       Cardiovascular negative cardio ROS   Rhythm:Regular Rate:Normal     Neuro/Psych Anxiety Bipolar Disorder    GI/Hepatic negative GI ROS, Neg liver ROS,   Endo/Other  negative endocrine ROS  Renal/GU negative Renal ROS     Musculoskeletal  (+) Arthritis ,   Abdominal Normal abdominal exam  (+)   Peds  Hematology negative hematology ROS (+)   Anesthesia Other Findings   Reproductive/Obstetrics                            Anesthesia Physical Anesthesia Plan  ASA: III  Anesthesia Plan: Spinal   Post-op Pain Management:    Induction: Intravenous  PONV Risk Score and Plan: 2 and Ondansetron, Propofol infusion and Treatment may vary due to age or medical condition  Airway Management Planned: Natural Airway and Simple Face Mask  Additional Equipment: None  Intra-op Plan:   Post-operative Plan:   Informed Consent: I have reviewed the patients History and Physical, chart, labs and discussed the procedure including the risks, benefits and alternatives for the proposed anesthesia with the patient or authorized representative who has indicated his/her understanding and acceptance.       Plan Discussed with: CRNA  Anesthesia Plan Comments:        Anesthesia Quick Evaluation

## 2019-06-04 NOTE — Anesthesia Postprocedure Evaluation (Signed)
Anesthesia Post Note  [name]  Procedure(s) Performed: RIGHT TOTAL HIP ARTHROPLASTY ANTERIOR APPROACH (Right )     Patient location during evaluation: PACU Anesthesia Type: Spinal Level of consciousness: oriented and awake and alert Pain management: pain level controlled Vital Signs Assessment: post-procedure vital signs reviewed and stable Respiratory status: spontaneous breathing, respiratory function stable and patient connected to nasal cannula oxygen Cardiovascular status: blood pressure returned to baseline and stable Postop Assessment: no headache, no backache, no apparent nausea or vomiting and spinal receding Anesthetic complications: no    Last Vitals:  Vitals:   06/04/19 1427 06/04/19 1552  BP: 139/88 (!) 160/78  Pulse: (!) 57 68  Resp: 14 16  Temp:  36.5 C  SpO2: 100% 100%    Last Pain:  Vitals:   06/04/19 1415  TempSrc:   PainSc: 0-No pain                 Effie Berkshire

## 2019-06-04 NOTE — Anesthesia Procedure Notes (Addendum)
Spinal  Patient location during procedure: OR Start time: 06/04/2019 11:30 AM End time: 06/04/2019 11:37 AM Staffing Resident/CRNA: Silas [place], CRNA Performed: resident/CRNA  Preanesthetic Checklist Completed: patient identified, site marked, surgical consent, pre-op evaluation, timeout performed, IV checked, risks and benefits discussed and monitors and equipment checked Spinal Block Patient position: sitting Prep: DuraPrep Patient monitoring: heart rate, cardiac monitor, continuous pulse ox and blood pressure Approach: midline Location: L3-4 Injection technique: single-shot Needle Needle type: Sprotte  Needle gauge: 24 G Needle length: 9 cm Assessment Sensory level: T4 Additional Notes IV functioning, monitors applied to pt. Expiration date of kit checked and confirmed to be in date. Sterile prep and drape, hand hygiene and sterile gloved used. Pt was positioned and spine was prepped in sterile fashion. Skin was anesthetized with lidocaine. Free flow of clear CSF obtained prior to injecting local anesthetic into CSF x 1 attempt. Spinal needle aspirated freely following injection. Needle was carefully withdrawn, and pt tolerated procedure well. Loss of motor and sensory on exam post injection.

## 2019-06-04 NOTE — H&P (Signed)
TOTAL HIP ADMISSION H&P  Patient is admitted for right total hip arthroplasty.  Subjective:  Chief Complaint: right hip pain  HPI: Judy [name], 68 y.o. female, has a history of pain and functional disability in the right hip(s) due to avascular necrosis and patient has failed non-surgical conservative treatments for greater than 12 weeks to include NSAID's and/or analgesics, flexibility and strengthening excercises, supervised PT with diminished ADL's post treatment, use of assistive devices and activity modification.  Onset of symptoms was abrupt starting 2 years ago with rapidlly worsening course since that time.The patient noted no past surgery on the right hip(s).  Patient currently rates pain in the right hip at 10 out of 10 with activity. Patient has night pain, pain worse with activities and weight bearing, and a significant limp.Patient has evidence of subchondral cysts by imaging studies. This condition presents safety issues increasing the risk of falls.  There is no current active infection.  Patient Active Problem List   Diagnosis Date Noted  . 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  . Status post total replacement of left hip 03/19/2019  . Avascular necrosis of bone of hip, 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  . PTSD (post-traumatic stress disorder)   . COPD (chronic obstructive pulmonary disease) (HCC)   . Bipolar 1 disorder (HCC)   . Anxiety   . B12 deficiency 12/09/2018  . COPD with acute exacerbation (HCC) 04/14/2017  . Hyperglycemia 04/14/2017   Past Medical History:  Diagnosis Date  . Anxiety   . Arthritis   . Avascular necrosis (HCC)    Left hip  . Bipolar 1 disorder (HCC)   . COPD (chronic obstructive pulmonary disease) (HCC)   . Pneumonia   . PTSD (post-traumatic stress disorder)     Past Surgical History:  Procedure Laterality Date  . bone spurs foot Right   . COLONOSCOPY    . HAND SURGERY Left   . JOINT REPLACEMENT    . TONSILLECTOMY    .  TONSILLECTOMY AND ADENOIDECTOMY    . TOTAL HIP ARTHROPLASTY Left 03/19/2019   Procedure: LEFT TOTAL HIP ARTHROPLASTY ANTERIOR APPROACH;  Surgeon: Kathryne HitchBlackman, Jaystin Mcgarvey Y, MD;  Location: [facility];  Service: Orthopedics;  Laterality: Left;    No current facility-administered medications for this encounter.    Current Outpatient Medications  Medication Sig Dispense Refill Last Dose  . acetaminophen (TYLENOL) 500 MG tablet Take 1,500 mg by mouth every 8 (eight) hours as needed for moderate pain.      Marland Kitchen. acetaminophen-codeine (TYLENOL #3) 300-30 MG tablet TAKE 1 TO 2 TABLETS BY MOUTH EVERY 8 HOURS AS NEEDED FOR MODERATE PAIN (Patient taking differently: Take 1-2 tablets by mouth every 8 (eight) hours as needed for moderate pain. ) 30 tablet 0   . cariprazine (VRAYLAR) capsule Take 3 mg by mouth daily.      Marland Kitchen. LORazepam (ATIVAN) 0.5 MG tablet Take 1.5 mg by mouth at bedtime.     Marland Kitchen. lurasidone (LATUDA) 80 MG TABS tablet Take 80 mg by mouth every evening.      . meloxicam (MOBIC) 15 MG tablet Take 15 mg by mouth daily.     . methocarbamol (ROBAXIN) 500 MG tablet Take 1 tablet (500 mg total) by mouth every 6 (six) hours as needed for muscle spasms. 40 tablet 0   . oxyCODONE (OXY IR/ROXICODONE) 5 MG immediate release tablet Take 1-2 tablets (5-10 mg total) by mouth every 6 (six) hours as needed for moderate pain (pain score 4-6). (Patient taking  differently: Take 5 mg by mouth every 6 (six) hours as needed for moderate pain (pain score 4-6). ) 40 tablet 0   . QUEtiapine (SEROQUEL) 300 MG tablet Take 800 mg by mouth at bedtime.       Allergies  Allergen Reactions  . Celexa [Citalopram Hydrobromide]     "goes weird"  . Gabapentin     "goes weird"     Social History   Tobacco Use  . Smoking status: Current Every Day Smoker    Packs/day: 0.50    Years: 43.00    Pack years: 21.50    Types: Cigarettes    Last attempt to quit: 10/19/2018    Years since quitting: 0.6  . Smokeless tobacco: Never Used  .  Tobacco comment: 3-4 cigarettes a day at this time  Substance Use Topics  . Alcohol use: Yes    Comment: occ    Family History  Problem Relation Age of Onset  . CAD Mother 22  . COPD Mother   . Depression Mother   . CAD Father 66  . Hearing loss Father   . Depression Sister   . Heart attack Maternal Grandmother   . Early death Maternal Grandfather   . Hearing loss Paternal Grandfather      Review of Systems  Musculoskeletal: Positive for joint pain.  All other systems reviewed and are negative.   Objective:  Physical Exam  Constitutional: She is oriented to person, place, and time. She appears well-developed and well-nourished.  HENT:  Head: Normocephalic and atraumatic.  Eyes: Pupils are equal, round, and reactive to light. EOM are normal.  Neck: Normal range of motion.  Cardiovascular: Normal rate.  Respiratory: Effort normal.  GI: Soft.  Musculoskeletal:     Right hip: She exhibits decreased range of motion, decreased strength, tenderness and bony tenderness.  Neurological: She is alert and oriented to person, place, and time.  Skin: Skin is warm and dry.  Psychiatric: She has a normal mood and affect.    Vital signs in last 24 hours:    Labs:   Estimated body mass index is 23.82 kg/m as calculated from the following:   Height as of 06/01/19: 5\' 10"  (1.778 m).   Weight as of 06/01/19: 75.3 kg.   Imaging Review Plain radiographs demonstrate severe avascular necrosis of the right hip(s). The bone quality appears to be good for age and reported activity level.      Assessment/Plan:  End stage AVN, right hip(s)  The patient history, physical examination, clinical judgement of the provider and imaging studies are consistent with end stage avascular necrosis of the right hip(s) and total hip arthroplasty is deemed medically necessary. The treatment options including medical management, injection therapy, arthroscopy and arthroplasty were discussed at length.  The risks and benefits of total hip arthroplasty were presented and reviewed. The risks due to aseptic loosening, infection, stiffness, dislocation/subluxation,  thromboembolic complications and other imponderables were discussed.  The patient acknowledged the explanation, agreed to proceed with the plan and consent was signed. Patient is being admitted for inpatient treatment for surgery, pain control, PT, OT, prophylactic antibiotics, VTE prophylaxis, progressive ambulation and ADL's and discharge planning.The patient is planning to be discharged home with home health services

## 2019-06-04 NOTE — Evaluation (Signed)
Physical Therapy Evaluation Patient Details Name: Judy [name] MRN: [id number] DOB: Feb 22, 1951 Today's Date: 06/04/2019   History of Present Illness  68 yo female s/p R DA-THA on 06/04/19. PMH includes R THA, PTSD, bipolar disorder, COPD, anxiety.  Clinical Impression  Pt presents with R hip pain, decreased R hip strength, increased time and effort to perform mobility tasks, and decreased activity tolerance. Pt to benefit from acute PT to address deficits. Pt ambulated 42 ft with RW with min guard asist, verbal cuing for form and safety. Pt educated on ankle pumps (20/hour) to perform this afternoon/evening to increase circulation, to pt's tolerance and limited by pain. PT to progress mobility as tolerated, and will continue to follow acutely.        Follow Up Recommendations Follow surgeon's recommendation for DC plan and follow-up therapies;Supervision for mobility/OOB    Equipment Recommendations  None recommended by PT    Recommendations for Other Services       Precautions / Restrictions Precautions Precautions: Fall Restrictions Weight Bearing Restrictions: No Other Position/Activity Restrictions: WBAT      Mobility  Bed Mobility Overal bed mobility: Needs Assistance Bed Mobility: Supine to Sit     Supine to sit: Min guard;HOB elevated     General bed mobility comments: min gaurd for safety, verbal cuing for sequencing.  Transfers Overall transfer level: Needs assistance Equipment used: Rolling walker (2 wheeled) Transfers: Sit to/from Stand Sit to Stand: Min guard;From elevated surface         General transfer comment: min guard for safety, verbal cuing for hand placement when rising.  Ambulation/Gait Ambulation/Gait assistance: Min guard Gait Distance (Feet): 45 Feet Assistive device: Rolling walker (2 wheeled) Gait Pattern/deviations: Step-to pattern;Decreased step length - right;Decreased step length - left;Trunk flexed;Antalgic Gait velocity:  decr   General Gait Details: min guard for safety, verbal cuing for sequencing, placement in RW, upright posture.  Stairs            Wheelchair Mobility    Modified Rankin (Stroke Patients Only)       Balance Overall balance assessment: Mild deficits observed, not formally tested                                           Pertinent Vitals/Pain Pain Assessment: 0-10 Pain Score: 4  Pain Location: R hip Pain Descriptors / Indicators: Throbbing Pain Intervention(s): Limited activity within patient's tolerance;Monitored during session;Premedicated before session;Repositioned;Ice applied    Home Living Family/patient expects to be discharged to:: Private residence Living Arrangements: Alone Available Help at Discharge: Family;Available 24 hours/day(daughter to stay with pt monday and tuesday for 24/7) Type of Home: House Home Access: Stairs to enter Entrance Stairs-Rails: Psychiatric nurse of Steps: 4 Home Layout: One level Home Equipment: Walker - 2 wheels;Bedside commode      Prior Function Level of Independence: Independent with assistive device(s)         Comments: pt using RW for ambulation PTA, going without to "test myself"     Hand Dominance   Dominant Hand: Right    Extremity/Trunk Assessment   Upper Extremity Assessment Upper Extremity Assessment: Overall WFL for tasks assessed    Lower Extremity Assessment Lower Extremity Assessment: Overall WFL for tasks assessed;RLE deficits/detail RLE Deficits / Details: suspected post-surgical R hip weakness; able to perform ankle pumps, quad set, heel slide, leg lift during bed mobility RLE Sensation: WNL  Cervical / Trunk Assessment Cervical / Trunk Assessment: Normal  Communication   Communication: No difficulties  Cognition Arousal/Alertness: Awake/alert Behavior During Therapy: WFL for tasks assessed/performed Overall Cognitive Status: Within Functional Limits  for tasks assessed                                        General Comments      Exercises     Assessment/Plan    PT Assessment Patient needs continued PT services  PT Problem List Decreased strength;Decreased mobility;Decreased range of motion;Decreased activity tolerance;Decreased balance;Decreased knowledge of use of DME;Pain       PT Treatment Interventions DME instruction;Therapeutic activities;Gait training;Therapeutic exercise;Patient/family education;Balance training;Stair training;Functional mobility training    PT Goals (Current goals can be found in the Care Plan section)  Acute Rehab PT Goals PT Goal Formulation: With patient Time For Goal Achievement: 06/11/19 Potential to Achieve Goals: Good    Frequency 7X/week   Barriers to discharge        Co-evaluation               AM-PAC PT "6 Clicks" Mobility  Outcome Measure Help needed turning from your back to your side while in a flat bed without using bedrails?: A Little Help needed moving from lying on your back to sitting on the side of a flat bed without using bedrails?: A Little Help needed moving to and from a bed to a chair (including a wheelchair)?: A Little Help needed standing up from a chair using your arms (e.g., wheelchair or bedside chair)?: A Little Help needed to walk in hospital room?: A Little Help needed climbing 3-5 steps with a railing? : A Lot 6 Click Score: 17    End of Session Equipment Utilized During Treatment: Gait belt Activity Tolerance: Patient tolerated treatment well Patient left: in chair;with chair alarm set;with call bell/phone within reach;with SCD's reapplied Nurse Communication: Mobility status PT Visit Diagnosis: Other abnormalities of gait and mobility (R26.89);Difficulty in walking, not elsewhere classified (R26.2)    Time: [id number] PT Time Calculation (min) (ACUTE ONLY): 22 min   Charges:   PT Evaluation $PT Eval Low Complexity: 1 Low           Rebeckah Masih Terrial Rhodes Aizik Reh, PT Acute Rehabilitation Services Pager [phone]  Office [phone]  Pascual Mantel D Despina Hiddenure 06/04/2019, 7:18 PM

## 2019-06-04 NOTE — Transfer of Care (Signed)
Immediate Anesthesia Transfer of Care Note  [name]  Procedure(s) Performed: RIGHT TOTAL HIP ARTHROPLASTY ANTERIOR APPROACH (Right )  Patient Location: PACU  Anesthesia Type:MAC and Spinal  Level of Consciousness: drowsy, patient cooperative and responds to stimulation  Airway & Oxygen Therapy: Patient Spontanous Breathing and Patient connected to face mask oxygen  Post-op Assessment: Report given to RN and Post -op Vital signs reviewed and stable  Post vital signs: Reviewed and stable  Last Vitals:  Vitals Value Taken Time  BP 128/74 06/04/19 1302  Temp    Pulse 67 06/04/19 1304  Resp 13 06/04/19 1304  SpO2 100 % 06/04/19 1304  Vitals shown include unvalidated device data.  Last Pain:  Vitals:   06/04/19 0845  TempSrc: Oral  PainSc:       Patients Stated Pain Goal: 4 (30/16/01 0932)  Complications: No apparent anesthesia complications

## 2019-06-04 NOTE — Brief Op Note (Signed)
06/04/2019  12:38 PM  PATIENT:  Judy [name]  68 y.o. female  PRE-OPERATIVE DIAGNOSIS:  Avascular necrosis right hip  POST-OPERATIVE DIAGNOSIS:  Avascular necrosis right hip  PROCEDURE:  Procedure(s): RIGHT TOTAL HIP ARTHROPLASTY ANTERIOR APPROACH (Right)  SURGEON:  Surgeon(s) and Role:    Mcarthur Rossetti, MD - Primary  PHYSICIAN ASSISTANT: Benita Stabile, PA-C  ANESTHESIA:   spinal  EBL:  125 mL   COUNTS:  YES  DICTATION: .Other Dictation: Dictation Number [id number]  PLAN OF CARE: Admit to inpatient   PATIENT DISPOSITION:  PACU - hemodynamically stable.   Delay start of Pharmacological VTE agent (>24hrs) due to surgical blood loss or risk of bleeding: no

## 2019-06-05 DIAGNOSIS — F17210 Nicotine dependence, cigarettes, uncomplicated: Secondary | ICD-10-CM | POA: Diagnosis present

## 2019-06-05 DIAGNOSIS — Z825 Family history of asthma and other chronic lower respiratory diseases: Secondary | ICD-10-CM | POA: Diagnosis not present

## 2019-06-05 DIAGNOSIS — M879 Osteonecrosis, unspecified: Secondary | ICD-10-CM | POA: Diagnosis present

## 2019-06-05 DIAGNOSIS — F4310 Post-traumatic stress disorder, unspecified: Secondary | ICD-10-CM | POA: Diagnosis present

## 2019-06-05 DIAGNOSIS — M25451 Effusion, right hip: Secondary | ICD-10-CM | POA: Diagnosis present

## 2019-06-05 DIAGNOSIS — Z8249 Family history of ischemic heart disease and other diseases of the circulatory system: Secondary | ICD-10-CM | POA: Diagnosis not present

## 2019-06-05 DIAGNOSIS — J449 Chronic obstructive pulmonary disease, unspecified: Secondary | ICD-10-CM | POA: Diagnosis present

## 2019-06-05 DIAGNOSIS — F419 Anxiety disorder, unspecified: Secondary | ICD-10-CM | POA: Diagnosis present

## 2019-06-05 DIAGNOSIS — Z79899 Other long term (current) drug therapy: Secondary | ICD-10-CM | POA: Diagnosis not present

## 2019-06-05 DIAGNOSIS — Z888 Allergy status to other drugs, medicaments and biological substances status: Secondary | ICD-10-CM | POA: Diagnosis not present

## 2019-06-05 DIAGNOSIS — Z96642 Presence of left artificial hip joint: Secondary | ICD-10-CM | POA: Diagnosis present

## 2019-06-05 DIAGNOSIS — M21751 Unequal limb length (acquired), right femur: Secondary | ICD-10-CM | POA: Diagnosis present

## 2019-06-05 DIAGNOSIS — F319 Bipolar disorder, unspecified: Secondary | ICD-10-CM | POA: Diagnosis present

## 2019-06-05 LAB — CBC
HCT: 37.6 % (ref 36.0–46.0)
Hemoglobin: 11.6 g/dL — ABNORMAL LOW (ref 12.0–15.0)
MCH: 28.2 pg (ref 26.0–34.0)
MCHC: 30.9 g/dL (ref 30.0–36.0)
MCV: 91.5 fL (ref 80.0–100.0)
Platelets: 241 10*3/uL (ref 150–400)
RBC: 4.11 MIL/uL (ref 3.87–5.11)
RDW: 14.2 % (ref 11.5–15.5)
WBC: 12.1 10*3/uL — ABNORMAL HIGH (ref 4.0–10.5)
nRBC: 0 % (ref 0.0–0.2)

## 2019-06-05 LAB — BASIC METABOLIC PANEL
Anion gap: 8 (ref 5–15)
BUN: 9 mg/dL (ref 8–23)
CO2: 24 mmol/L (ref 22–32)
Calcium: 9.5 mg/dL (ref 8.9–10.3)
Chloride: 106 mmol/L (ref 98–111)
Creatinine, Ser: 0.79 mg/dL (ref 0.44–1.00)
GFR calc Af Amer: >60 mL/min (ref >60)
GFR calc non Af Amer: >60 mL/min (ref >60)
Glucose, Bld: 140 mg/dL — ABNORMAL HIGH (ref 70–99)
Potassium: 5 mmol/L (ref 3.5–5.1)
Sodium: 138 mmol/L (ref 135–145)

## 2019-06-05 MED ORDER — ASPIRIN 81 MG PO CHEW: 81.0000 mg | CHEWABLE_TABLET | Freq: Two times a day (BID) | ORAL | 0 refills | Status: DC

## 2019-06-05 MED ORDER — OXYCODONE HCL 5 MG PO TABS: 5.0000 mg | ORAL_TABLET | Freq: Four times a day (QID) | ORAL | 0 refills | Status: DC | PRN

## 2019-06-05 MED ORDER — METHOCARBAMOL 500 MG PO TABS: 500.0000 mg | ORAL_TABLET | Freq: Four times a day (QID) | ORAL | 1 refills | Status: DC | PRN

## 2019-06-05 NOTE — Care Management Obs Status (Signed)
[facility] NOTIFICATION   Patient Details  Name: Judy [name] MRN: [id number] Date of Birth: 10/02/1951   Medicare Observation Status Notification Given:  Yes    Joaquin Courts, RN 06/05/2019, 12:43 PM

## 2019-06-05 NOTE — Progress Notes (Signed)
    Home health agencies that serve [phone].        Home Health Agencies Search Results  Results List Table  Home Health Agency Information Quality of Patient Care Rating Patient Survey Summary Rating  [facility] [phone] 3 out of 5 stars 4 out of 5 stars  [facility] [phone] 4 out of 5 stars 4 out of 5 stars  [facility] [phone] 4  out of 5 stars 3 out of 5 stars  [facility] [phone] 4  out of 5 stars 4 out of 5 stars  [facility] [phone] 4 out of 5 stars 4 out of 5 stars  [facility] [phone] 4 out of 5 stars 4 out of 5 stars  [facility] [phone] 3  out of 5 stars 4 out of 5 stars  [facility] [phone] 3 out of 5 stars 4 out of 5 stars  [facility] [phone] 4 out of 5 stars Not Available12  [facility] [phone] 3  out of 5 stars 3 out of 5 stars  [facility] [phone] 4 out of 5 stars 4 out of 5 stars  [facility] [phone] 3  out of 5 stars 4 out of 5 stars  [facility] [phone] 3  out of 5 stars 3 out of 5 stars  [facility] [phone] 4  out of 5 stars 3 out of 5 stars  [facility] [phone] 4  out of 5 stars 2 out of 5 stars   Home Health Footnotes  Footnote number Footnote as displayed on Home Health Compare  1 This agency provides services under a federal waiver program to non-traditional, chronic long term population.  2 This agency provides services to a special needs population.  3 Not Available.  4 The number of patient episodes for this measure is too small to report.  5 This measure currently does not have data or provider has been certified/recertified for less than 6 months.  6 The national average for this measure is not provided because of state-to-state differences in data  collection.  7 Medicare is not displaying rates for this measure for any home health agency, because of an issue with the data.  8 There were problems with the data and they are being corrected.  9 Zero, or very few, patients met the survey's rules for inclusion. The scores shown, if any, reflect a very small number of surveys and may not accurately tell how an agency is doing.  10 Survey results are based on less than 12 months of data.  11 Fewer than 70 patients completed the survey. Use the scores shown, if any, with caution as the number of surveys may be too low to accurately tell how an agency is doing.  12 No survey results are available for this period.  13 Data suppressed by CMS for one or more quarters.    

## 2019-06-05 NOTE — Progress Notes (Signed)
Subjective: 1 Day Post-Op Procedure(s) (LRB): RIGHT TOTAL HIP ARTHROPLASTY ANTERIOR APPROACH (Right) Patient reports pain as moderate.    Objective: Vital signs in last 24 hours: Temp:  [97.4 F (36.3 C)-97.8 F (36.6 C)] 97.5 F (36.4 C) (08/29 0830) Pulse Rate:  [57-89] 89 (08/29 0830) Resp:  [12-25] 16 (08/29 0830) BP: (115-160)/(67-88) 115/76 (08/29 0830) SpO2:  [96 %-100 %] 96 % (08/29 0830)  Intake/Output from previous day: 08/28 0701 - 08/29 0700 In: 2959.6 [P.O.:920; I.V.:1739.6; IV Piggyback:300] Out: 9702 [Urine:4850; Blood:125] Intake/Output this shift: Total I/O In: 360 [P.O.:360] Out: 100 [Urine:100]  Recent Labs    06/05/19 0232  HGB 11.6*   Recent Labs    06/05/19 0232  WBC 12.1*  RBC 4.11  HCT 37.6  PLT 241   Recent Labs    06/05/19 0232  NA 138  K 5.0  CL 106  CO2 24  BUN 9  CREATININE 0.79  GLUCOSE 140*  CALCIUM 9.5   No results for input(s): LABPT, INR in the last 72 hours.  Sensation intact distally Intact pulses distally Dorsiflexion/Plantar flexion intact Incision: dressing C/D/I   Assessment/Plan: 1 Day Post-Op Procedure(s) (LRB): RIGHT TOTAL HIP ARTHROPLASTY ANTERIOR APPROACH (Right) Up with therapy Plan for discharge tomorrow Discharge home with home health She does not feel like she can go home today.  Some nausea and discomfort.  Could be a fall risk.  She did stay until Sunday in June of this year when we replaced her left hip.   Patient's anticipated LOS is less than 2 midnights, meeting these requirements: - Younger than 84 - Lives within 1 hour of care - Has a competent adult at home to recover with post-op recover - NO history of  - Chronic pain requiring opiods  - Diabetes  - Coronary Artery Disease  - Heart failure  - Heart attack  - Stroke  - DVT/VTE  - Cardiac arrhythmia  - Respiratory Failure/COPD  - Renal failure  - Anemia  - Advanced Liver disease       Mcarthur Rossetti 06/05/2019,  9:24 AM

## 2019-06-05 NOTE — Discharge Instructions (Signed)

## 2019-06-05 NOTE — TOC Initial Note (Signed)
Transition of Care [facility]) - Initial/Assessment Note    Patient Details  Name: Judy [name] MRN: [id number] Date of Birth: 1950-12-23  Transition of Care [facility]) CM/SW Contact:    Joaquin Courts, RN Phone Number: 06/05/2019, 10:38 AM  Clinical Narrative:    CM spoke with patient at bedside. [facility] with [facility] for [place]. Patient reports has rolling walker and 3-in-1.                Expected Discharge Plan: [place] Barriers to Discharge: No Barriers Identified   Patient Goals and CMS Choice Patient states their goals for this hospitalization and ongoing recovery are:: to go home CMS Medicare.gov Compare Post Acute Care list provided to:: Patient Choice offered to / list presented to : Patient  Expected Discharge Plan and Services Expected Discharge Plan: [place]   Discharge Planning Services: CM Consult Post Acute Care Choice: [place] arrangements for the past 2 months: Single Family Home Expected Discharge Date: 06/06/19               DME Arranged: N/A DME Agency: NA       HH Arranged: PT HH Agency: [facility] (formerly [employer]) Date [place]: 06/05/19 Time [place]: [place] Representative spoke with at [place]: pre arranged by MD office  Prior Living Arrangements/Services Living arrangements for the past 2 months: [place] Lives with:: Self Patient language and need for interpreter reviewed:: Yes Do you feel safe going back to the place where you live?: Yes      Need for Family Participation in Patient Care: Yes (Comment) Care giver support system in place?: Yes (comment)   Criminal Activity/Legal Involvement Pertinent to Current Situation/Hospitalization: No - Comment as needed  Activities of Daily Living Home Assistive Devices/Equipment: Bedside commode/3-in-1, Walker (specify type), Eyeglasses ADL Screening (condition at time of  admission) Patient's cognitive ability adequate to safely complete daily activities?: Yes Is the patient deaf or have difficulty hearing?: No Does the patient have difficulty seeing, even when wearing glasses/contacts?: No Does the patient have difficulty concentrating, remembering, or making decisions?: No Patient able to express need for assistance with ADLs?: Yes Does the patient have difficulty dressing or bathing?: No Independently performs ADLs?: Yes (appropriate for developmental age) Does the patient have difficulty walking or climbing stairs?: Yes Weakness of Legs: Right Weakness of Arms/Hands: None  Permission Sought/Granted                  Emotional Assessment   Attitude/Demeanor/Rapport: Engaged Affect (typically observed): Accepting Orientation: : Oriented to Place, Oriented to  Time, Oriented to Situation, Oriented to Self   Psych Involvement: No (comment)  Admission diagnosis:  Avascular necrosis right hip Patient Active Problem List   Diagnosis Date Noted  . 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 Avascular necrosis of bone of right hip ([place]) 04/28/2019  . Status post total replacement of left hip 03/19/2019  . Avascular necrosis of bone of hip, left ([employer]) 12/28/2018  . PTSD (post-traumatic stress disorder)   . COPD (chronic obstructive pulmonary disease) ([place])   . Bipolar 1 disorder ([place])   . Anxiety   . B12 deficiency 12/09/2018  . COPD with acute exacerbation ([place]) 04/14/2017  . Hyperglycemia 04/14/2017   PCP:  Caren Macadam, MD Pharmacy:   [facility] [address], [place] - [address] [place] [place]  [place] [id number] Phone: [phone] Fax: [phone]     Social Determinants of Health (SDOH) Interventions    Readmission Risk Interventions No flowsheet data found.

## 2019-06-05 NOTE — Progress Notes (Signed)
   06/05/19 1500  PT Visit Information  Last PT Received On 06/05/19  Assistance Needed +1  History of Present Illness 68 yo female s/p R DA-THA on 06/04/19. PMH includes R THA, PTSD, bipolar disorder, COPD, anxiety.  Precautions  Precautions Fall  Restrictions  Weight Bearing Restrictions No  Other Position/Activity Restrictions WBAT  Pain Assessment  Pain Assessment Faces  Faces Pain Scale 4  Pain Location R hip  Pain Descriptors / Indicators Throbbing  Pain Intervention(s) Monitored during session;Limited activity within patient's tolerance;Repositioned  Cognition  Arousal/Alertness Awake/alert  Behavior During Therapy WFL for tasks assessed/performed  Overall Cognitive Status Within Functional Limits for tasks assessed  Bed Mobility  Overal bed mobility Needs Assistance  Bed Mobility Supine to Sit  Supine to sit Min assist  General bed mobility comments min A for LE management off EOB  Transfers  Overall transfer level Needs assistance  Equipment used Rolling walker (2 wheeled)  Transfers Sit to/from Stand  Sit to Stand Min guard  General transfer comment min guard for safety, verbal cuing for hand placement when rising.  Ambulation/Gait  Ambulation/Gait assistance Min guard  Gait Distance (Feet) 400 Feet  Assistive device Rolling walker (2 wheeled)  Gait Pattern/deviations Decreased step length - right;Decreased step length - left;Antalgic;Step-through pattern  General Gait Details Mildly antalgic gait. Cues for increased step length and postural control.  Gait velocity decr  Balance  Overall balance assessment Mild deficits observed, not formally tested  PT - End of Session  Equipment Utilized During Treatment Gait belt  Activity Tolerance Patient tolerated treatment well  Patient left with call bell/phone within reach;in chair  Nurse Communication Mobility status   PT - Assessment/Plan  PT Plan Current plan remains appropriate  PT Visit Diagnosis Other  abnormalities of gait and mobility (R26.89);Difficulty in walking, not elsewhere classified (R26.2)  PT Frequency (ACUTE ONLY) 7X/week  Follow Up Recommendations Follow surgeon's recommendation for DC plan and follow-up therapies;Supervision for mobility/OOB  PT equipment None recommended by PT  AM-PAC PT "6 Clicks" Mobility Outcome Measure (Version 2)  Help needed turning from your back to your side while in a flat bed without using bedrails? 3  Help needed moving from lying on your back to sitting on the side of a flat bed without using bedrails? 3  Help needed moving to and from a bed to a chair (including a wheelchair)? 3  Help needed standing up from a chair using your arms (e.g., wheelchair or bedside chair)? 3  Help needed to walk in hospital room? 3  Help needed climbing 3-5 steps with a railing?  3  6 Click Score 18  Consider Recommendation of Discharge To: Home with [facility]  PT Goal Progression  Progress towards PT goals Progressing toward goals  Acute Rehab PT Goals  PT Goal Formulation With patient  Time For Goal Achievement 06/11/19  Potential to Achieve Goals Good  PT Time Calculation  PT Start Time (ACUTE ONLY) 1352  PT Stop Time (ACUTE ONLY) 1409  PT Time Calculation (min) (ACUTE ONLY) 17 min  PT General Charges  $$ ACUTE PT VISIT 1 Visit  PT Treatments  $Gait Training 8-22 mins   Pt is progressing well towards. This PM she increased ambulation distance and quality of gait. Current plan remains appropriate.   Benjiman Core, PTA Pager [phone] Acute Rehab

## 2019-06-05 NOTE — Progress Notes (Signed)
Physical Therapy Treatment Patient Details Name: Judy [name] MRN: [id number] DOB: 06/24/1951 Today's Date: 06/05/2019    History of Present Illness 68 yo female s/p R DA-THA on 06/04/19. PMH includes R THA, PTSD, bipolar disorder, COPD, anxiety.    PT Comments    Pt asleep on arrival to room, but willing to rise and participate with therapy. She notified PTA that there will be no one available at her home to give assistance until tomorrow. Pt is progressing well towards goals. Will continue to follow acutely for mobility progression.     Follow Up Recommendations  Follow surgeon's recommendation for DC plan and follow-up therapies;Supervision for mobility/OOB     Equipment Recommendations  None recommended by PT    Recommendations for Other Services       Precautions / Restrictions Precautions Precautions: Fall Restrictions Weight Bearing Restrictions: No Other Position/Activity Restrictions: WBAT    Mobility  Bed Mobility Overal bed mobility: Needs Assistance Bed Mobility: Supine to Sit;Sit to Supine     Supine to sit: Min guard Sit to supine: Min assist   General bed mobility comments: min guard to rise to EOB. Min A for LE management to return to supine.  Transfers Overall transfer level: Needs assistance Equipment used: Rolling walker (2 wheeled) Transfers: Sit to/from Stand Sit to Stand: Min guard         General transfer comment: min guard for safety, verbal cuing for hand placement when rising.  Ambulation/Gait Ambulation/Gait assistance: Min guard Gait Distance (Feet): 150 Feet Assistive device: Rolling walker (2 wheeled) Gait Pattern/deviations: Decreased step length - right;Decreased step length - left;Antalgic;Step-through pattern Gait velocity: decr   General Gait Details: mildly antalgic gaid. Cues for RW proximity and postural control.   Stairs Stairs: Yes Stairs assistance: Min guard Stair Management: Two rails;Step to  pattern;Forwards Number of Stairs: 4 General stair comments: min guard for safety to negotiate steps. VC for sequencing and technique. No physical assist required   Wheelchair Mobility    Modified Rankin (Stroke Patients Only)       Balance Overall balance assessment: Mild deficits observed, not formally tested                                          Cognition Arousal/Alertness: Awake/alert Behavior During Therapy: WFL for tasks assessed/performed Overall Cognitive Status: Within Functional Limits for tasks assessed                                        Exercises Total Joint Exercises Quad Sets: AROM;Right;5 reps;Supine Towel Squeeze: AROM;Right;5 reps;Supine Heel Slides: AROM;Right;5 reps;Supine Hip ABduction/ADduction: AROM;Right;5 reps;Supine Long Arc Quad: AROM;Right;5 reps;Seated Marching in Standing: AROM;Right;5 reps;Standing    General Comments        Pertinent Vitals/Pain Pain Assessment: Faces Faces Pain Scale: Hurts little more Pain Location: R hip Pain Descriptors / Indicators: Throbbing Pain Intervention(s): Monitored during session;Limited activity within patient's tolerance    Home Living                      Prior Function            PT Goals (current goals can now be found in the care plan section) Acute Rehab PT Goals PT Goal Formulation: With patient Time For Goal Achievement: 06/11/19  Potential to Achieve Goals: Good Progress towards PT goals: Progressing toward goals    Frequency    7X/week      PT Plan Current plan remains appropriate    Co-evaluation              AM-PAC PT "6 Clicks" Mobility   Outcome Measure  Help needed turning from your back to your side while in a flat bed without using bedrails?: A Little Help needed moving from lying on your back to sitting on the side of a flat bed without using bedrails?: A Little Help needed moving to and from a bed to a chair  (including a wheelchair)?: A Little Help needed standing up from a chair using your arms (e.g., wheelchair or bedside chair)?: A Little Help needed to walk in hospital room?: A Little Help needed climbing 3-5 steps with a railing? : A Little 6 Click Score: 18    End of Session Equipment Utilized During Treatment: Gait belt Activity Tolerance: Patient tolerated treatment well Patient left: with call bell/phone within reach;in bed;with bed alarm set Nurse Communication: Mobility status PT Visit Diagnosis: Other abnormalities of gait and mobility (R26.89);Difficulty in walking, not elsewhere classified (R26.2)     Time: [id number] PT Time Calculation (min) (ACUTE ONLY): 23 min  Charges:  $Gait Training: 8-22 mins $Therapeutic Exercise: 8-22 mins                     Kallie LocksHannah Jeydi Klingel, [place] Pager [phone] Acute Rehab   Sheral ApleyHannah E Juanjesus Pepperman 06/05/2019, 12:26 PM

## 2019-06-06 NOTE — Progress Notes (Signed)
Patient ID: Judy [name], female   DOB: 1951-02-06, 68 y.o.   MRN: [id number] The patient is doing well.  Her right hip is stable.  She can be discharged to home this afternoon.

## 2019-06-06 NOTE — Progress Notes (Signed)
Physical Therapy Treatment Patient Details Name: Judy [name] MRN: [id number] DOB: 1950-11-20 Today's Date: 06/06/2019    History of Present Illness 68 yo female s/p R DA-THA on 06/04/19. PMH includes R THA, PTSD, bipolar disorder, COPD, anxiety.    PT Comments    Pt continues cooperative but decreased activity tolerance this date 2* fatigue and increased pain.  Pt looking forward to return home this date.   Follow Up Recommendations  Follow surgeon's recommendation for DC plan and follow-up therapies;Supervision for mobility/OOB     Equipment Recommendations  None recommended by PT    Recommendations for Other Services       Precautions / Restrictions Precautions Precautions: Fall Restrictions Weight Bearing Restrictions: No Other Position/Activity Restrictions: WBAT    Mobility  Bed Mobility Overal bed mobility: Needs Assistance Bed Mobility: Sit to Supine       Sit to supine: Min assist   General bed mobility comments: min A for R LE  Transfers Overall transfer level: Needs assistance Equipment used: Rolling walker (2 wheeled) Transfers: Sit to/from Stand Sit to Stand: Min guard         General transfer comment: min guard for safety,  Ambulation/Gait Ambulation/Gait assistance: Min guard;Supervision Gait Distance (Feet): 190 Feet Assistive device: Rolling walker (2 wheeled) Gait Pattern/deviations: Decreased step length - right;Decreased step length - left;Antalgic;Step-through pattern Gait velocity: decr   General Gait Details: Mildly antalgic gait. Cues for increased step length, position from RW and postural control.   Stairs         General stair comments: Pt states she feels comfortable with negotiating stairs   Wheelchair Mobility    Modified Rankin (Stroke Patients Only)       Balance Overall balance assessment: Mild deficits observed, not formally tested                                           Cognition Arousal/Alertness: Awake/alert Behavior During Therapy: WFL for tasks assessed/performed Overall Cognitive Status: Within Functional Limits for tasks assessed                                        Exercises Total Joint Exercises Ankle Circles/Pumps: AROM;Both;15 reps;Supine Quad Sets: AROM;Right;Supine;10 reps Heel Slides: AROM;Right;Supine;20 reps Hip ABduction/ADduction: AROM;Right;Supine;15 reps    General Comments        Pertinent Vitals/Pain Pain Assessment: 0-10 Pain Score: 6  Pain Location: R hip Pain Descriptors / Indicators: Aching;Sore Pain Intervention(s): Limited activity within patient's tolerance;Monitored during session;Premedicated before session;Ice applied    Home Living                      Prior Function            PT Goals (current goals can now be found in the care plan section) Acute Rehab PT Goals Patient Stated Goal: HOME PT Goal Formulation: With patient Time For Goal Achievement: 06/11/19 Potential to Achieve Goals: Good Progress towards PT goals: Progressing toward goals    Frequency    7X/week      PT Plan Current plan remains appropriate    Co-evaluation              AM-PAC PT "6 Clicks" Mobility   Outcome Measure  Help needed turning from your back  to your side while in a flat bed without using bedrails?: A Little Help needed moving from lying on your back to sitting on the side of a flat bed without using bedrails?: A Little Help needed moving to and from a bed to a chair (including a wheelchair)?: A Little Help needed standing up from a chair using your arms (e.g., wheelchair or bedside chair)?: A Little Help needed to walk in hospital room?: A Little Help needed climbing 3-5 steps with a railing? : A Little 6 Click Score: 18    End of Session Equipment Utilized During Treatment: Gait belt Activity Tolerance: Patient tolerated treatment well;Patient limited by pain Patient  left: with call bell/phone within reach;in bed Nurse Communication: Mobility status PT Visit Diagnosis: Other abnormalities of gait and mobility (R26.89);Difficulty in walking, not elsewhere classified (R26.2)     Time: 5825-1898 PT Time Calculation (min) (ACUTE ONLY): 27 min  Charges:  $Gait Training: 8-22 mins $Therapeutic Exercise: 8-22 mins                     [place] Pager [phone] Office [phone]    Barry Faircloth 06/06/2019, 10:27 AM

## 2019-06-06 NOTE — Discharge Summary (Signed)
Patient ID: Judy [name] MRN: [id number] DOB/AGE: [birth year] 68 y.o.  Admit date: 06/04/2019 Discharge date: 06/06/2019  Admission Diagnoses:  Principal Problem:   Avascular necrosis of bone of right hip [facility]) Active Problems:   Status post total replacement of right hip   Discharge Diagnoses:  Same  Past Medical History:  Diagnosis Date  . Anxiety   . Arthritis   . Avascular necrosis (HCC)    Left hip  . Bipolar 1 disorder (HCC)   . COPD (chronic obstructive pulmonary disease) (HCC)   . Pneumonia   . PTSD (post-traumatic stress disorder)     Surgeries: Procedure(s): RIGHT TOTAL HIP ARTHROPLASTY ANTERIOR APPROACH on 06/04/2019   Consultants:   Discharged Condition: Improved  Hospital Course: Judy [name] is an 68 y.o. female who was admitted 06/04/2019 for operative treatment ofAvascular necrosis of bone of right hip (HCC). Patient has severe unremitting pain that affects sleep, daily activities, and work/hobbies. After pre-op clearance the patient was taken to the operating room on 06/04/2019 and underwent  Procedure(s): RIGHT TOTAL HIP ARTHROPLASTY ANTERIOR APPROACH.    Patient was given perioperative antibiotics:  Anti-infectives (From admission, onward)   Start     Dose/Rate Route Frequency Ordered Stop   06/04/19 1730  ceFAZolin (ANCEF) IVPB 1 g/50 mL premix     1 g 100 mL/hr over 30 Minutes Intravenous Every 6 hours 06/04/19 1434 06/04/19 2318   06/04/19 0845  ceFAZolin (ANCEF) IVPB 2g/100 mL premix     2 g 200 mL/hr over 30 Minutes Intravenous On call to O.R. 06/04/19 0839 06/04/19 1135       Patient was given sequential compression devices, early ambulation, and chemoprophylaxis to prevent DVT.  Patient benefited maximally from hospital stay and there were no complications.    Recent vital signs:  Patient Vitals for the past 24 hrs:  BP Temp Temp src Pulse Resp SpO2  06/06/19 0635 101/66 98.5 F (36.9 C) Oral 73 16 92 %  06/05/19 2058 136/81  98.4 F (36.9 C) Oral 76 16 96 %  06/05/19 1441 119/74 97.7 F (36.5 C) Oral 76 16 96 %  06/05/19 0830 115/76 (!) 97.5 F (36.4 C) - 89 16 96 %     Recent laboratory studies:  Recent Labs    06/05/19 0232  WBC 12.1*  HGB 11.6*  HCT 37.6  PLT 241  NA 138  K 5.0  CL 106  CO2 24  BUN 9  CREATININE 0.79  GLUCOSE 140*  CALCIUM 9.5     Discharge Medications:   Allergies as of 06/06/2019      Reactions   Celexa [citalopram Hydrobromide]    "goes weird"   Gabapentin    "goes weird"       Medication List    STOP taking these medications   acetaminophen-codeine 300-30 MG tablet Commonly known as: TYLENOL #3     TAKE these medications   acetaminophen 500 MG tablet Commonly known as: TYLENOL Take 1,500 mg by mouth every 8 (eight) hours as needed for moderate pain.   aspirin 81 MG chewable tablet Chew 1 tablet (81 mg total) by mouth 2 (two) times daily.   LORazepam 0.5 MG tablet Commonly known as: ATIVAN Take 1.5 mg by mouth at bedtime.   lurasidone 80 MG Tabs tablet Commonly known as: LATUDA Take 80 mg by mouth every evening.   meloxicam 15 MG tablet Commonly known as: MOBIC Take 15 mg by mouth daily.   methocarbamol 500 MG tablet Commonly  known as: ROBAXIN Take 1 tablet (500 mg total) by mouth every 6 (six) hours as needed for muscle spasms. What changed: Another medication with the same name was added. Make sure you understand how and when to take each.   methocarbamol 500 MG tablet Commonly known as: ROBAXIN Take 1 tablet (500 mg total) by mouth every 6 (six) hours as needed for muscle spasms. What changed: You were already taking a medication with the same name, and this prescription was added. Make sure you understand how and when to take each.   oxyCODONE 5 MG immediate release tablet Commonly known as: Oxy IR/ROXICODONE Take 1-2 tablets (5-10 mg total) by mouth every 6 (six) hours as needed for moderate pain (pain score 4-6). What changed: how  much to take   QUEtiapine 300 MG tablet Commonly known as: SEROQUEL Take 800 mg by mouth at bedtime.   Vraylar capsule Generic drug: cariprazine Take 3 mg by mouth daily.            Durable Medical Equipment  (From admission, onward)         Start     Ordered   06/04/19 1435  DME 3 n 1  Once     06/04/19 1434   06/04/19 1435  DME Walker rolling  Once    Question:  Patient needs a walker to treat with the following condition  Answer:  Status post total replacement of right hip   06/04/19 1434          Diagnostic Studies: Dg Pelvis Portable  Result Date: 06/04/2019 CLINICAL DATA:  Postop day 0 ANTERIOR approach RIGHT total hip arthroplasty. EXAM: PORTABLE PELVIS 1-2 VIEWS 1:21 p.m.: COMPARISON:  Intraoperative x-ray earlier same day at 12:23 p.m. AP pelvis x-rays 03/19/2019. FINDINGS: Anatomic alignment in the AP projection post RIGHT total hip arthroplasty. No acute complicating features. Osseous demineralization. Degenerative changes involving the visualized LOWER lumbar spine. The LEFT total hip arthroplasty is anatomically aligned without complicating features. IMPRESSION: Anatomic alignment in the AP projection post RIGHT total hip arthroplasty without acute complicating features. Electronically Signed   By: Evangeline Dakin M.D.   On: 06/04/2019 13:37   Dg C-arm 1-60 Min-no Report  Result Date: 06/04/2019 Fluoroscopy was utilized by the requesting physician.  No radiographic interpretation.   Dg Hip Operative Unilat W Or W/o Pelvis Right  Result Date: 06/04/2019 CLINICAL DATA:  Avascular necrosis of the right femoral head. EXAM: OPERATIVE RIGHT HIP WITH PELVIS; DG C-ARM 1-60 MIN-NO REPORT COMPARISON:  RADIOGRAPH DATED 03/19/2019 FINDINGS: AP views of the pelvis and right hip demonstrate that the acetabular and femoral components of the new right total hip prosthesis appear in excellent position in the AP projection. No fractures. IMPRESSION: Satisfactory appearance of the  right hip in the AP projection after total hip prosthesis insertion. Electronically Signed   By: Lorriane Shire M.D.   On: 06/04/2019 13:05    Disposition: Discharge disposition: 01-Home or [place]    Mcarthur Rossetti, MD Follow up in 2 week(s).   Specialty: Orthopedic Surgery Contact information: [place] [address] [phone]        [facility].   Specialty: Home Health Services Why: agency will provide home health physical therapy. agency will call you to schedule first visit. Contact information: [address] [place] [address] [phone]  Signed: Kathryne HitchChristopher Y  06/06/2019, 7:48 AM

## 2019-06-07 ENCOUNTER — Telehealth: Payer: Self-pay | Admitting: Radiology

## 2019-06-07 ENCOUNTER — Encounter (HOSPITAL_COMMUNITY): Payer: Self-pay | Admitting: Orthopaedic Surgery

## 2019-06-07 NOTE — Telephone Encounter (Signed)
See below °FYI °

## 2019-06-07 NOTE — Telephone Encounter (Signed)
Received voicemail on triage line from Judy [name] with [facility]. She went out to see patient who has recently been discharged from hospital status post Right THA on 06/04/2019. Judy [name] states when she got there today, patient told her she fell yesterday evening and laid in the floor until her daughter got there and got her up this morning. Patient stated that she was discombobulated from the medication.  Patient did not want to come into the office and have x-rays, seemed to walk fine, and Erin did not notice any bruising, however she did state that she was sore.  Erin called as FYI to Dr. Ninfa Linden.

## 2019-06-17 ENCOUNTER — Ambulatory Visit (INDEPENDENT_AMBULATORY_CARE_PROVIDER_SITE_OTHER): Payer: Medicare Other | Admitting: Orthopaedic Surgery

## 2019-06-17 ENCOUNTER — Encounter: Payer: Self-pay | Admitting: Orthopaedic Surgery

## 2019-06-17 DIAGNOSIS — Z96641 Presence of right artificial hip joint: Secondary | ICD-10-CM

## 2019-06-17 MED ORDER — ACETAMINOPHEN-CODEINE #3 300-30 MG PO TABS: 1.0000 | ORAL_TABLET | Freq: Three times a day (TID) | ORAL | 0 refills | Status: DC | PRN

## 2019-06-17 NOTE — Progress Notes (Signed)
The patient is 2 weeks tomorrow status post a right total hip arthroplasty.  We replaced her left hip earlier this year.  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  She is making progress with physical therapy at home.  They have recommended outpatient therapy but she feels like she is much further along with this right hip and her left hip and feels like we can wait and defer this till later if she needs it.  She has been on aspirin twice a day.  She would like to transition from oxycodone down to Tylenol 3.  On examination of her right hip her incision looks great.  I placed new Steri-Strips but overall it looks good.  Her leg lengths appear equal.  At this point we will see her back in 4 weeks to see how she doing from a mobility standpoint.  She can stop her aspirin.  All questions concerns were answered and addressed.  I will send in some Tylenol 3 for her as well today.  In 4 weeks from now no x-rays are needed.

## 2019-07-15 ENCOUNTER — Other Ambulatory Visit: Payer: Self-pay

## 2019-07-15 ENCOUNTER — Ambulatory Visit (INDEPENDENT_AMBULATORY_CARE_PROVIDER_SITE_OTHER): Payer: Medicare Other | Admitting: Orthopaedic Surgery

## 2019-07-15 ENCOUNTER — Encounter: Payer: Self-pay | Admitting: Orthopaedic Surgery

## 2019-07-15 DIAGNOSIS — Z96641 Presence of right artificial hip joint: Secondary | ICD-10-CM

## 2019-07-15 DIAGNOSIS — Z96642 Presence of left artificial hip joint: Secondary | ICD-10-CM

## 2019-07-15 MED ORDER — HYDROCODONE-ACETAMINOPHEN 5-325 MG PO TABS: 1.0000 | ORAL_TABLET | Freq: Four times a day (QID) | ORAL | 0 refills | Status: DC | PRN

## 2019-07-15 NOTE — Progress Notes (Signed)
The patient is now 41 days status post a right total hip arthroplasty.  She had a left hip replaced just 5 weeks before that.  She has severe avascular course of both hips.  She is walking without assistive device now and reports that she is doing well overall.  She does need a refill of hydrocodone.  She says her range of motion and strength are increasing.  She stopped using a walker just yesterday.  On exam both hips have excellent range of motion.  She has some subjective numbness around her right hip which is not present left side.  Her right hip was only replaced more recently.  Her leg lengths are also equal.  At this point she is doing so well that we do not need to see her back for 6 months.  At that visit like a standing AP pelvis and lateral both her hips.  If she has any issues before then she will let us know.  All question concerns were answered and addressed.

## 2019-07-20 ENCOUNTER — Other Ambulatory Visit: Payer: Self-pay

## 2019-07-20 ENCOUNTER — Telehealth: Payer: Self-pay | Admitting: Orthopaedic Surgery

## 2019-07-20 MED ORDER — AMOXICILLIN 500 MG PO TABS: ORAL_TABLET | ORAL | 0 refills | Status: DC

## 2019-07-20 NOTE — Telephone Encounter (Signed)
Patient called. She says she is having dental work done. She will need medication. Her call back number is [phone]

## 2019-07-20 NOTE — Telephone Encounter (Signed)
Called into pharmacy

## 2019-08-05 ENCOUNTER — Other Ambulatory Visit: Payer: Self-pay

## 2019-08-05 ENCOUNTER — Telehealth: Payer: Self-pay | Admitting: Orthopaedic Surgery

## 2019-08-05 MED ORDER — AMOXICILLIN 500 MG PO TABS: ORAL_TABLET | ORAL | 1 refills | Status: DC

## 2019-08-05 NOTE — Telephone Encounter (Signed)
Pt called in requesting a refill on Amoxicillin 500 MG and please have that sent to [facility] on [address].   [phone]

## 2019-08-05 NOTE — Telephone Encounter (Signed)
Sent!

## 2019-09-14 ENCOUNTER — Other Ambulatory Visit: Payer: Self-pay

## 2019-09-14 ENCOUNTER — Telehealth: Payer: Self-pay | Admitting: Orthopaedic Surgery

## 2019-09-14 MED ORDER — AMOXICILLIN 500 MG PO TABS: ORAL_TABLET | ORAL | 0 refills | Status: DC

## 2019-09-14 NOTE — Telephone Encounter (Signed)
Sent in to pharmacy.  

## 2019-09-14 NOTE — Telephone Encounter (Signed)
Patient called and requesting a refill on amoxicillin from Dr. Ninfa Linden. Patient [facility] is [facility]. Patient phone number is [phone]

## 2019-10-04 ENCOUNTER — Other Ambulatory Visit: Payer: Self-pay

## 2019-10-04 ENCOUNTER — Telehealth (INDEPENDENT_AMBULATORY_CARE_PROVIDER_SITE_OTHER): Payer: Medicare Other | Admitting: Family Medicine

## 2019-10-04 ENCOUNTER — Telehealth: Payer: Self-pay | Admitting: Orthopaedic Surgery

## 2019-10-04 ENCOUNTER — Telehealth: Payer: Self-pay

## 2019-10-04 DIAGNOSIS — R42 Dizziness and giddiness: Secondary | ICD-10-CM | POA: Diagnosis not present

## 2019-10-04 MED ORDER — AMOXICILLIN 500 MG PO TABS: ORAL_TABLET | ORAL | 0 refills | Status: DC

## 2019-10-04 NOTE — Telephone Encounter (Signed)
Copied from [place] [id number]. Topic: Appointment Scheduling - Scheduling Inquiry for Clinic >> Oct 04, 2019 10:53 AM Judy [name] D wrote: Reason for CRM: pt called saying she is been having dizziness when lying down and it has been going on for several weeks.  She would lik eto make an appt with Dr. Elesa Massed asap.

## 2019-10-04 NOTE — Telephone Encounter (Signed)
Sent in to pharmacy.  

## 2019-10-04 NOTE — Progress Notes (Signed)
Virtual Visit via Telephone Note  I connected with Judy [name] on 10/04/19 at  3:30 PM EST by telephone and verified that I am speaking with the correct person using two identifiers.   I discussed the limitations, risks, security and privacy concerns of performing an evaluation and management service by telephone and the availability of in person appointments. I also discussed with the patient that there may be a patient responsible charge related to this service. The patient expressed understanding and agreed to proceed.  Location patient: home Location provider: work or home office Participants present for the call: patient, provider Patient did not have a visit in the prior 7 days to address this/these issue(s).   History of Present Illness: Pt is a 68 yo female with pmh sig for COPD, avascular necrosis of b/l hips s/p b/l replacement, PTSD, bipolar d/o, anxiety, B12 def followed by Dr. Ethlyn Gallery.  Pt with dizzy spells off and on x 2 wks.  Episodes last for 3-5 sec when rolling over in bed.  Happened once while sitting.  Pt denies symptoms when bending over or getting up out of bed.  Drinks 2 + quarts of tea daily.  Does not drink any water.   Denies changes in medications, bp problems.     Observations/Objective: Patient sounds cheerful and well on the phone. I do not appreciate any SOB. Speech and thought processing are grossly intact. Patient reported vitals:  Assessment and Plan: Dizziness -possible causes include dehydration, vertigo, medications, hypoxemia given h/o COPD -pt encouraged to increase po intake of water.  Goal 3-4 16.9 oz bottles of water per day.   -advised to decrease tea intake -pt's med list includes lorazepam 1.5 mg qhs which should help dizziness if 2/2 vertigo.  Consider decreasing dose -pt advised to f/u with pcp in 1-2 wks for continued symptoms.  Follow Up Instructions: F/u in 1-2 wks with pcp  [id number] 5-10 [id number] 11-20 9443 21-30 I did not  refer this patient for an OV in the next 24 hours for this/these issue(s).  I discussed the assessment and treatment plan with the patient. The patient was provided an opportunity to ask questions and all were answered. The patient agreed with the plan and demonstrated an understanding of the instructions.   The patient was advised to call back or seek an in-person evaluation if the symptoms worsen or if the condition fails to improve as anticipated.  I provided 6:24 minutes of non-face-to-face time during this encounter.   Billie Ruddy, MD    This note is not being shared with the patient for the following reason: To prevent harm (release of this note would result in harm to the life or physical safety of the patient or another).

## 2019-10-04 NOTE — Telephone Encounter (Signed)
Spoke to pt and she stated that she becomes dizzy when lying down. Pt stated this has been going on for 2 weeks.

## 2019-10-04 NOTE — Telephone Encounter (Signed)
Patient called requesting an RX for Amoxicillin.  She stated that she will need 16 tablets because has 4 dental appointments coming up.  [facility] uses [facility] [address].  [phone].  Thank you.

## 2019-10-04 NOTE — Telephone Encounter (Signed)
Spoke with the pt and she stated she has noticed dizziness when she turns over while lying in bed and sometimes while sitting.  Patient denies any chest pain, shortness of breath, headache or other symptoms.  Virtual appt scheduled for today at 3:30pm with Dr Volanda Napoleon.

## 2019-12-02 ENCOUNTER — Telehealth: Payer: Self-pay | Admitting: Orthopaedic Surgery

## 2019-12-02 NOTE — Telephone Encounter (Signed)
Patient called. She would like a refill on amoxicillin.

## 2019-12-02 NOTE — Telephone Encounter (Signed)
Patient aware we only do this for 3 months after surgery

## 2019-12-08 ENCOUNTER — Encounter: Payer: Self-pay | Admitting: Orthopaedic Surgery

## 2019-12-08 ENCOUNTER — Other Ambulatory Visit: Payer: Self-pay

## 2019-12-08 ENCOUNTER — Ambulatory Visit (INDEPENDENT_AMBULATORY_CARE_PROVIDER_SITE_OTHER): Payer: Medicare Other | Admitting: Orthopaedic Surgery

## 2019-12-08 ENCOUNTER — Ambulatory Visit (INDEPENDENT_AMBULATORY_CARE_PROVIDER_SITE_OTHER): Payer: Medicare Other

## 2019-12-08 DIAGNOSIS — G8929 Other chronic pain: Secondary | ICD-10-CM

## 2019-12-08 DIAGNOSIS — M542 Cervicalgia: Secondary | ICD-10-CM | POA: Diagnosis not present

## 2019-12-08 DIAGNOSIS — M545 Low back pain, unspecified: Secondary | ICD-10-CM

## 2019-12-08 MED ORDER — HYDROCODONE-ACETAMINOPHEN 5-325 MG PO TABS: 1.0000 | ORAL_TABLET | Freq: Four times a day (QID) | ORAL | 0 refills | Status: DC | PRN

## 2019-12-08 NOTE — Progress Notes (Signed)
Office Visit Note   [name]           Date of Birth: [birth year]           MRN: [id number] Visit Date: 12/08/2019              Requested by: Caren Macadam, MD [place],  [place] [address] PCP: Caren Macadam, MD   Assessment & Plan: Visit Diagnoses:  1. Neck pain   2. Chronic bilateral low back pain, unspecified whether sciatica present     Plan: I am quite concerned about the swallowing difficulty she is having.  Given her severe limitations in mobility of her cervical spine combined with the significant bridging osteophytes throughout the cervical spine I do feel a MRI is medically and more urgently recommended of the cervical spine to assess the degree of stenosis.  I will then likely need to send her to a neck specialist.  I would also like to obtain an MRI of her lumbar spine given the severity of her back pain and the deformities were seeing on plain films.  This seems medically reasonable as well.  We will see her back once we have these MRIs.    Follow-Up Instructions: No follow-ups on file.   Orders:  Orders Placed This Encounter  Procedures  . XR Cervical Spine 2 or 3 views  . XR Lumbar Spine 2-3 Views   Meds ordered this encounter  Medications  . HYDROcodone-acetaminophen (NORCO/VICODIN) 5-325 MG tablet    Sig: Take 1 tablet by mouth every 6 (six) hours as needed for moderate pain.    Dispense:  30 tablet    Refill:  0      Procedures: No procedures performed   Clinical Data: No additional findings.   Subjective: Chief Complaint  Patient presents with  . Neck - Pain  . Lower Back - Pain  The patient comes in today for evaluation and treatment of years of neck and back pain.  She said she has had really bad flareups recently due to the severity of her pain and is gotten to where she cannot sit or stand too long.  She does report swallowing difficulties.  She denies a lot of numbness and tingling in her  upper or lower extremities or weakness but is more severe pain all up and down her lumbar spine and her cervical spine.  She does report limited motion of her neck and her back.  We have replaced both her hips.  She is 6 months out from a right total hip which was the most recent hip we replaced.  She has never had neck or lumbar spine surgery.  HPI  Review of Systems She currently denies any headache, chest pain, shortness of breath, fever, chills, nausea, vomiting.  She does report some swallowing difficulties.  She denies any change in bowel bladder function.  Objective: Vital Signs: There were no vitals taken for this visit.  Physical Exam She is alert and oriented x3 and in no acute distress Ortho Exam Examination of her cervical spine shows essentially no flexion and minimal extension.  Some of her rotation and lateral bending is limited as well.  She has good strength in her bilateral upper extremities and no sensory deficits.  She has limited flexion extension of her lumbar spine with significant pain.  There is tight hamstrings bilaterally.  She has good strength in her legs. Specialty Comments:  No specialty comments available.  Imaging:  XR Cervical Spine 2 or 3 views  Result Date: 12/08/2019 2 views of the cervical spine show large anterior osteophytes that are bridging throughout the entire cervical spine.  XR Lumbar Spine 2-3 Views  Result Date: 12/08/2019 2 views of the lumbar spine show severe degenerative changes at multiple levels.  There are large osteophytes at several levels.  There is significant narrowing at L5-S1.  The lateral view may show compression deformity of L1.    PMFS History: Patient Active Problem List   Diagnosis Date Noted  . 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  . Status post total replacement of left hip 03/19/2019  . Avascular necrosis of bone of hip, left (HCC)  12/28/2018  . PTSD (post-traumatic stress disorder)   . COPD (chronic obstructive pulmonary disease) (HCC)   . Bipolar 1 disorder (HCC)   . Anxiety   . B12 deficiency 12/09/2018  . COPD with acute exacerbation (HCC) 04/14/2017  . Hyperglycemia 04/14/2017   Past Medical History:  Diagnosis Date  . Anxiety   . Arthritis   . Avascular necrosis (HCC)    Left hip  . Bipolar 1 disorder (HCC)   . COPD (chronic obstructive pulmonary disease) (HCC)   . Pneumonia   . PTSD (post-traumatic stress disorder)     Family History  Problem Relation Age of Onset  . CAD Mother 1  . COPD Mother   . Depression Mother   . CAD Father 57  . Hearing loss Father   . Depression Sister   . Heart attack Maternal Grandmother   . Early death Maternal Grandfather   . Hearing loss Paternal Grandfather     Past Surgical History:  Procedure Laterality Date  . bone spurs foot Right   . COLONOSCOPY    . HAND SURGERY Left   . JOINT REPLACEMENT    . TONSILLECTOMY    . TONSILLECTOMY AND ADENOIDECTOMY    . TOTAL HIP ARTHROPLASTY Left 03/19/2019   Procedure: LEFT TOTAL HIP ARTHROPLASTY ANTERIOR APPROACH;  Surgeon: [name], MD;  Location: [facility];  Service: Orthopedics;  Laterality: Left;  . TOTAL HIP ARTHROPLASTY Right 06/04/2019   Procedure: RIGHT TOTAL HIP ARTHROPLASTY ANTERIOR APPROACH;  Surgeon: [name], MD;  Location: [facility];  Service: Orthopedics;  Laterality: Right;   Social History   Occupational History  . Occupation: retired  Tobacco Use  . Smoking status: Current Every Day Smoker    Packs/day: 0.50    Years: 43.00    Pack years: 21.50    Types: Cigarettes    Last attempt to quit: 10/19/2018    Years since quitting: 1.1  . Smokeless tobacco: Never Used  . Tobacco comment: 3-4 cigarettes a day at this time  Substance and Sexual Activity  . Alcohol use: Yes    Comment: occ  . Drug use: No  . Sexual activity: Not on file

## 2019-12-09 ENCOUNTER — Other Ambulatory Visit: Payer: Self-pay

## 2019-12-09 DIAGNOSIS — M4807 Spinal stenosis, lumbosacral region: Secondary | ICD-10-CM

## 2020-01-06 ENCOUNTER — Ambulatory Visit
Admission: RE | Admit: 2020-01-06 | Discharge: 2020-01-06 | Disposition: A | Discharge status: Home / Self Care | Payer: Medicare Other | Source: Ambulatory Visit | Attending: Orthopaedic Surgery | Admitting: Orthopaedic Surgery

## 2020-01-06 DIAGNOSIS — M4807 Spinal stenosis, lumbosacral region: Secondary | ICD-10-CM

## 2020-01-06 IMAGING — MR MR LUMBAR SPINE W/O CM
5 series · 44 of 48 positions shown · non-contrast
Comparison: None.

CLINICAL DATA: Bilateral leg weakness and right lower extremity
numbness

EXAM:
MRI LUMBAR SPINE WITHOUT CONTRAST
TECHNIQUE: Multiplanar, multisequence MR imaging of the lumbar spine was
performed. No intravenous contrast was administered.

[Series 2: tirm sag · sagittal · 4.0mm · 0.55mm/px · 6 of 13 slices shown]
[im 1/13]
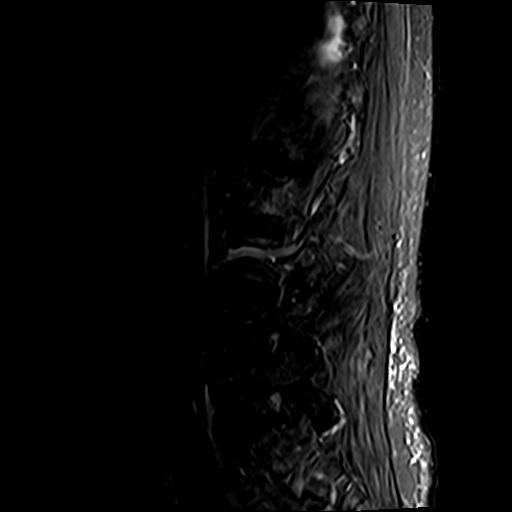
[im 3/13]
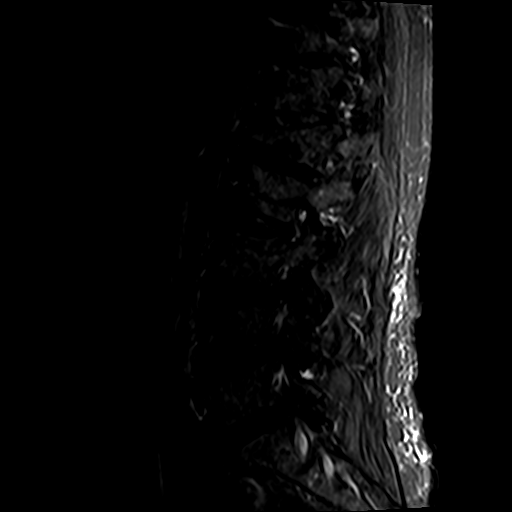
[im 5/13]
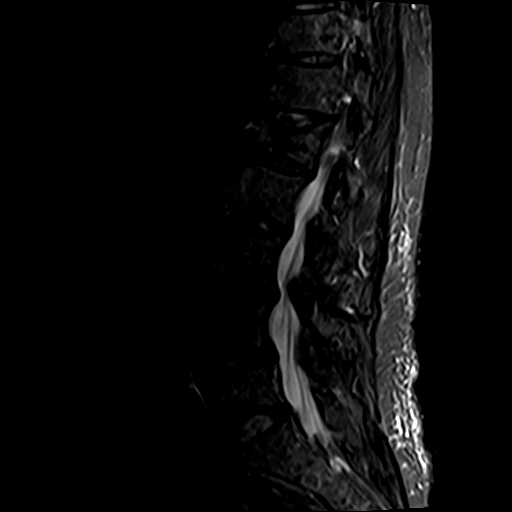
[im 8/13]
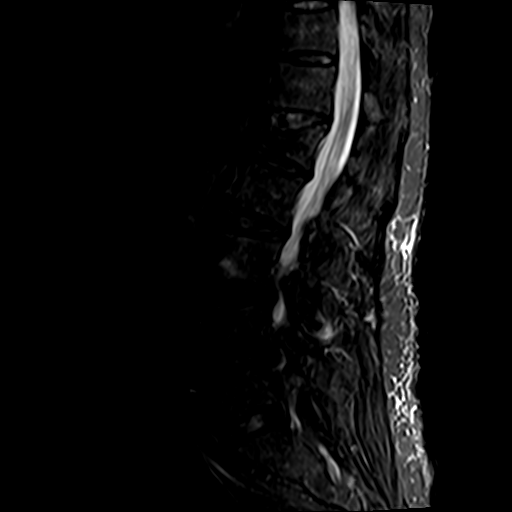
[im 10/13]
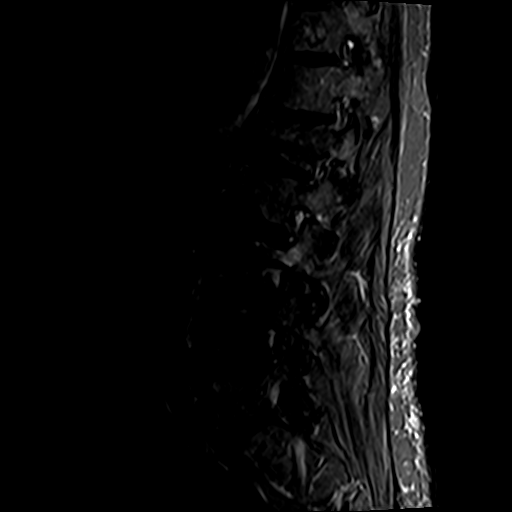
[im 13/13]
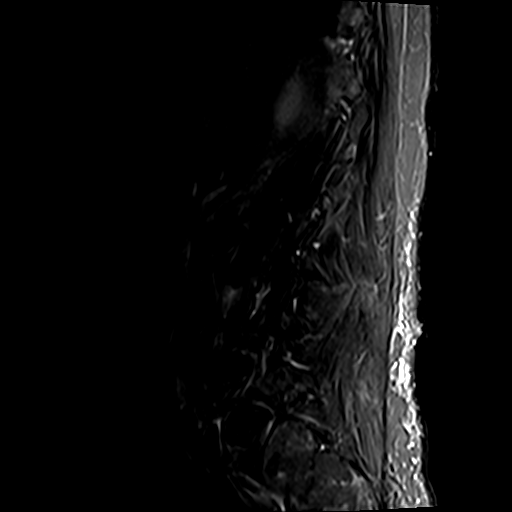

[Series 3: T2 · sagittal · 4.0mm · 0.88mm/px · 5 of 13 slices shown (1 of 2)]
[im 1/13]
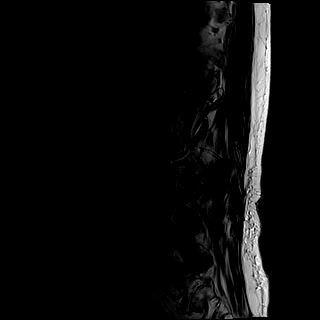
[im 4/13]
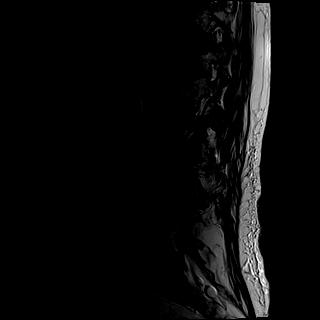
[im 7/13]
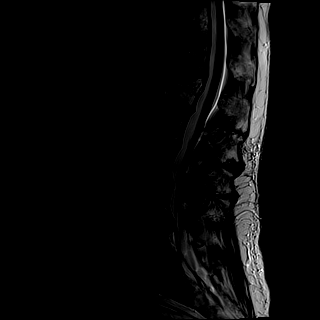
[im 10/13]
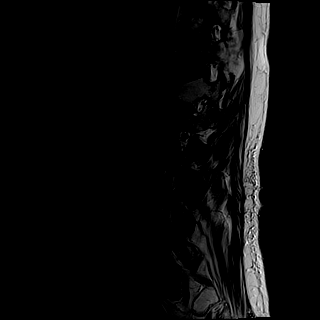
[im 13/13]
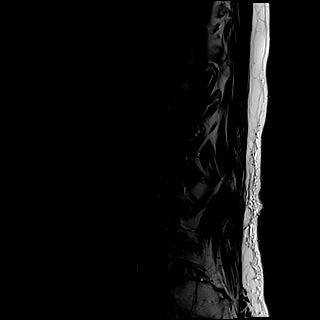

[Series 4: T1 · sagittal · 4.0mm · 0.88mm/px · 5 of 13 slices shown (1 of 2)]
[im 1/13]
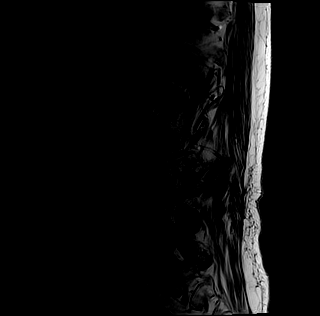
[im 4/13]
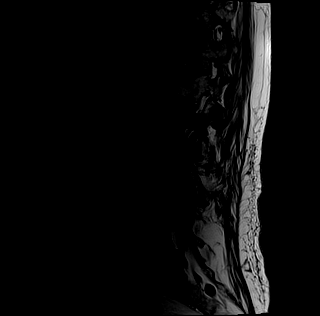
[im 7/13]
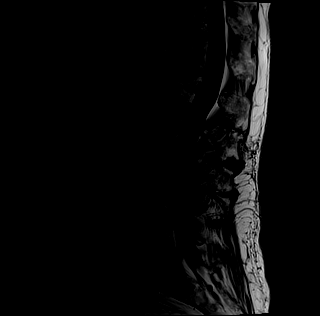
[im 10/13]
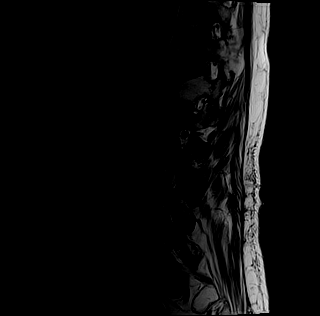
[im 13/13]
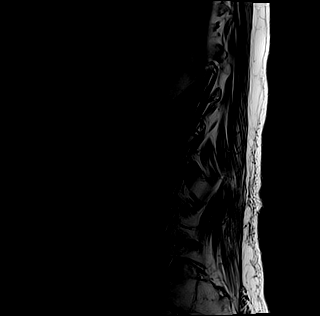

[Series 5: T1 · axial · 4.0mm · 0.78mm/px · z∈[-121,+99]mm · 12 of 38 slices shown (2 of 2)]
[im 1/38]
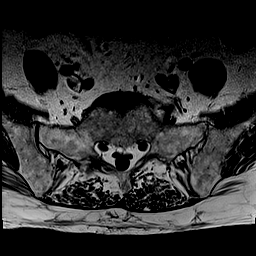
[im 3/38]
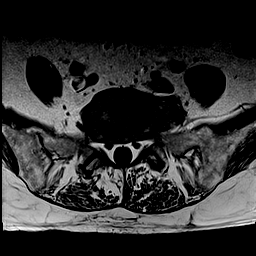
[im 5/38]
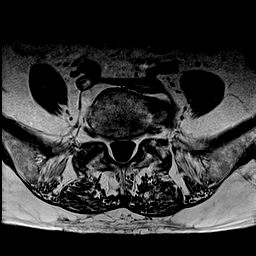
[im 8/38]
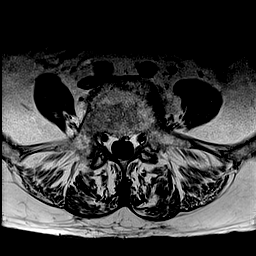
[im 10/38]
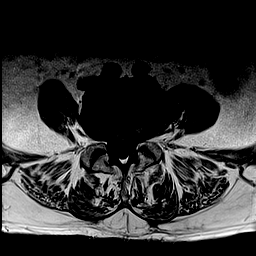
[im 13/38]
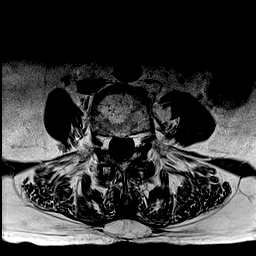
[im 18/38]
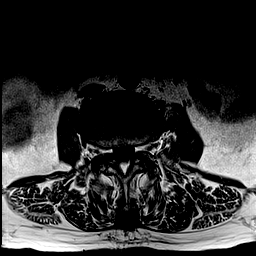
[im 20/38]
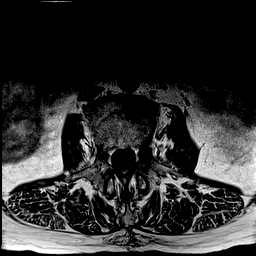
[im 23/38]
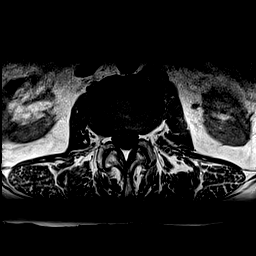
[im 28/38]
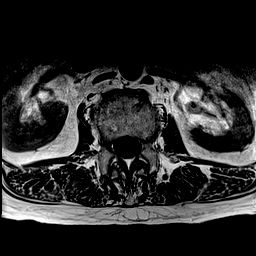
[im 33/38]
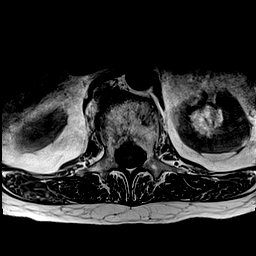
[im 38/38]
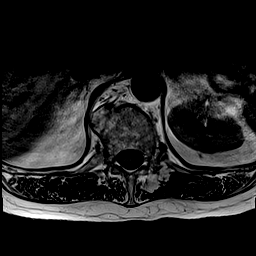

[Series 6: T2 · axial · 4.0mm · 0.78mm/px · z∈[-121,+99]mm · 16 of 38 slices shown (2 of 2)]
[im 1/38]
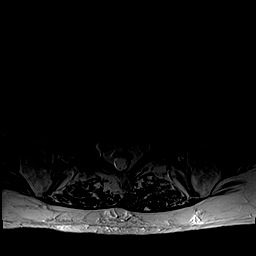
[im 3/38]
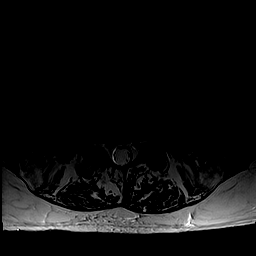
[im 5/38]
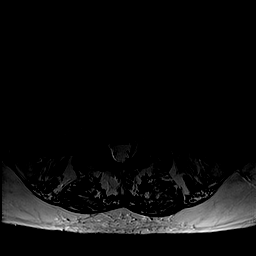
[im 8/38]
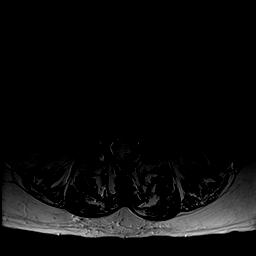
[im 10/38]
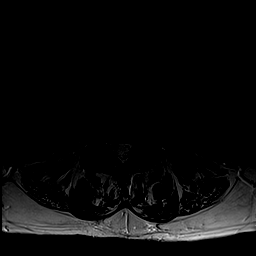
[im 13/38]
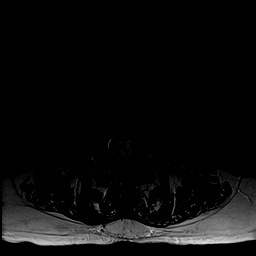
[im 15/38]
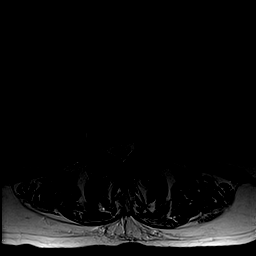
[im 18/38]
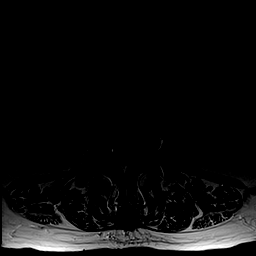
[im 20/38]
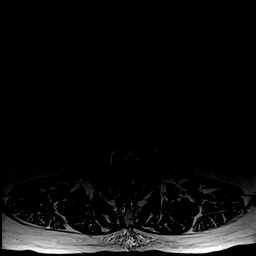
[im 23/38]
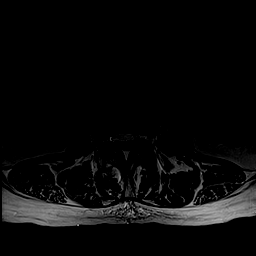
[im 25/38]
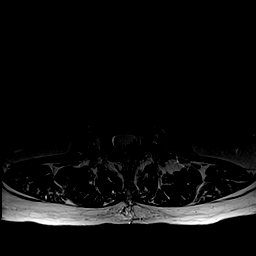
[im 28/38]
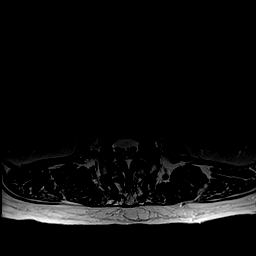
[im 30/38]
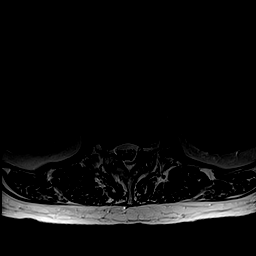
[im 33/38]
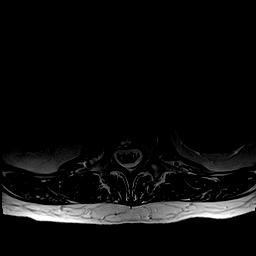
[im 35/38]
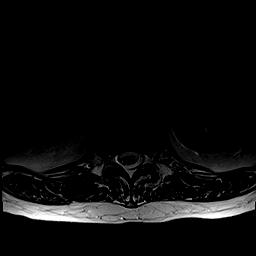
[im 38/38]
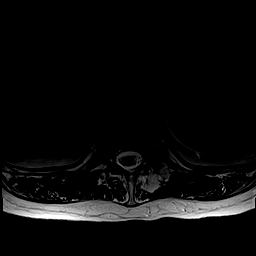

[44 of 48 positions shown; findings below may reference images not displayed]

FINDINGS: Segmentation:  Standard

Alignment:  Normal

Vertebrae: There is a wedge compression deformity of L1 with
approximately 25% height loss. No retropulsion. No bone marrow
edema. There are 2 hemangiomas within the T11 vertebral body.

Conus medullaris and cauda equina: Conus extends to the L2 level.
Conus and cauda equina appear normal.

Paraspinal and other soft tissues: Negative

Disc levels:

T12-L1: Normal disc space and facet joints. There is no spinal canal
stenosis. No neural foraminal stenosis.

L1-L2: Mild disc bulge. There is no spinal canal stenosis. No neural
foraminal stenosis.

L2-L3: Normal disc space and facet joints. There is no spinal canal
stenosis. No neural foraminal stenosis.

L3-L4: Severe facet hypertrophy and mild disc bulge. There is no
spinal canal stenosis. No neural foraminal stenosis.

L4-L5: Disc space narrowing with small central protrusion. Left
lateral recess narrowing without central spinal canal stenosis. No
neural foraminal stenosis.

L5-S1: Large left lateral osteophyte in close proximity to the
exiting left L5 nerve root. Moderate facet hypertrophy. There is no
spinal canal stenosis. No neural foraminal stenosis.

Visualized sacrum: Normal.
IMPRESSION: 1. Moderate to severe facet arthrosis at L3-4 and L5-S1, which may
serve as a source of local low back pain.
2. Left lateral recess stenosis at L4-L5, which may serve as a
source of left L5 radiculopathy.
3. Large left lateral osteophyte at L5-S1 that may impinge on the
exiting left L5 nerve root.
4. No spinal canal or neural foraminal stenosis.

## 2020-01-06 IMAGING — MR MR CERVICAL SPINE W/O CM
5 series · 29 of 48 positions shown · non-contrast
Comparison: None.

CLINICAL DATA: Decreased range of motion. Chronic neck pain

EXAM:
MRI CERVICAL SPINE WITHOUT CONTRAST
TECHNIQUE: Multiplanar, multisequence MR imaging of the cervical spine was
performed. No intravenous contrast was administered.

[Series 2: T2 · sagittal · 3.0mm · 0.41mm/px · 6 of 13 slices shown (1 of 2)]
[im 1/13]
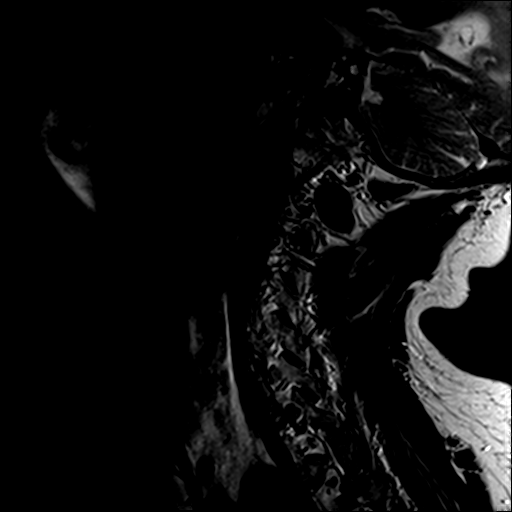
[im 3/13]
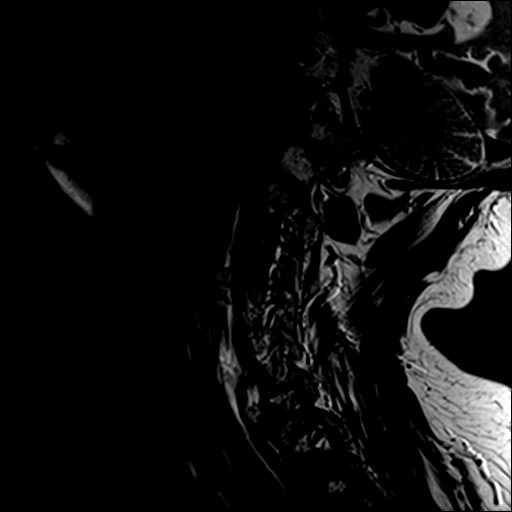
[im 5/13]
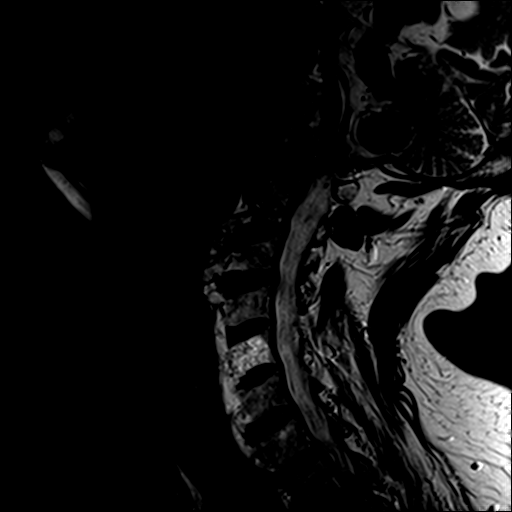
[im 8/13]
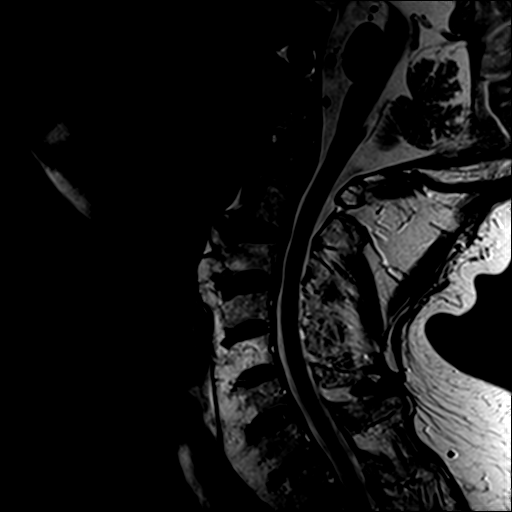
[im 10/13]
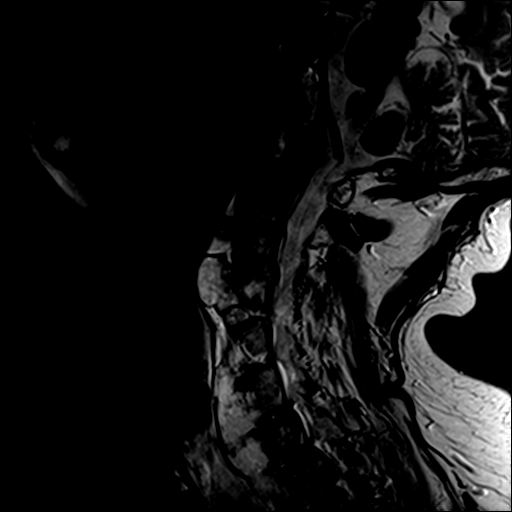
[im 13/13]
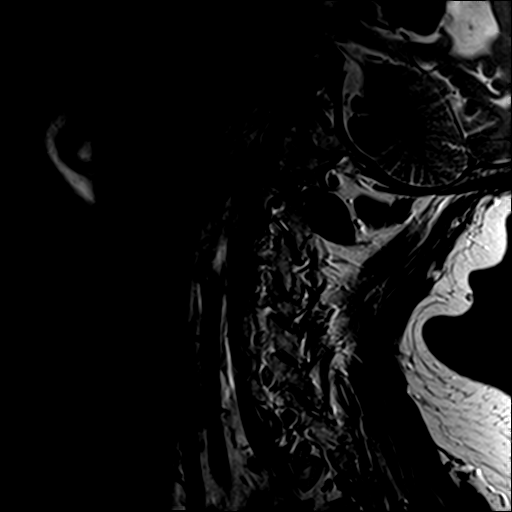

[Series 3: T1 · sagittal · 3.0mm · 0.41mm/px · 6 of 13 slices shown]
[im 1/13]
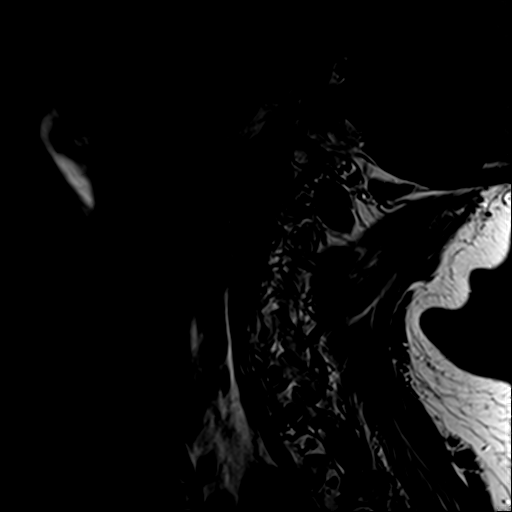
[im 3/13]
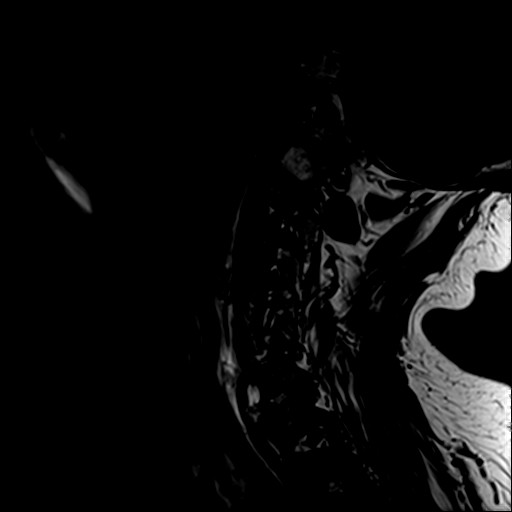
[im 5/13]
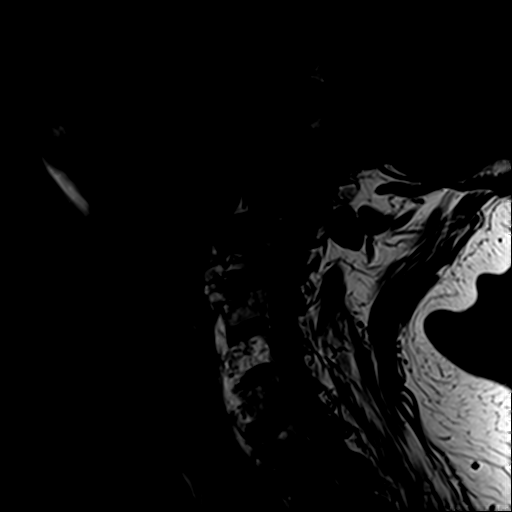
[im 8/13]
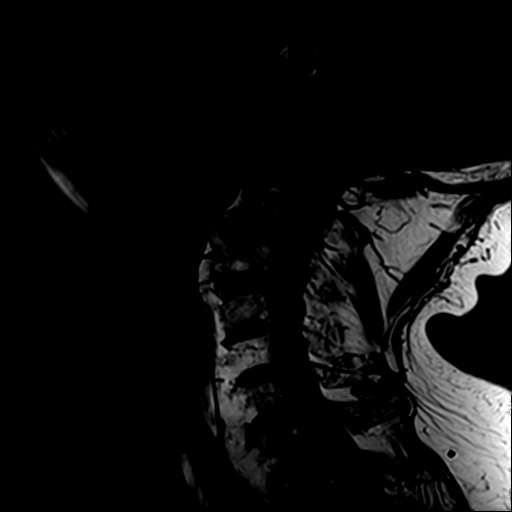
[im 10/13]
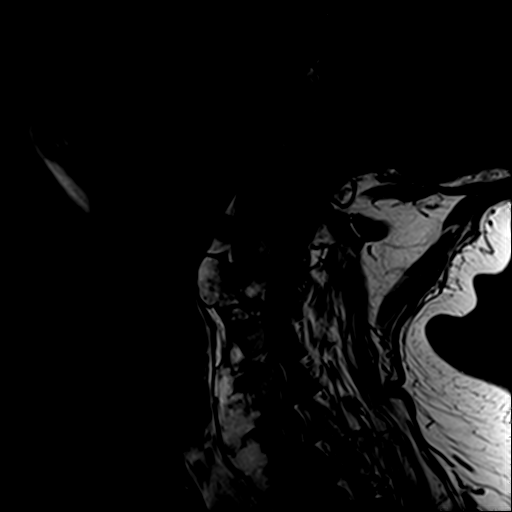
[im 13/13]
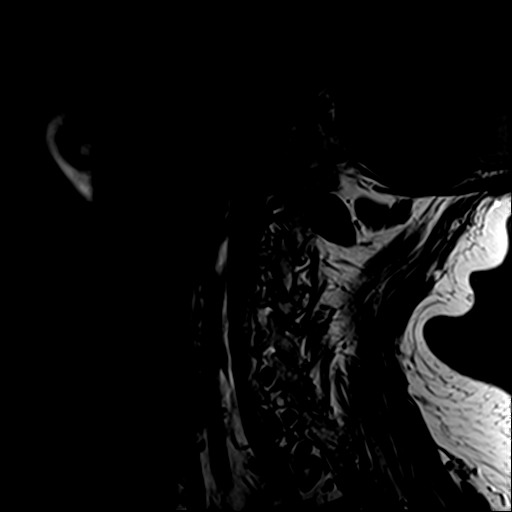

[Series 4: STIR · sagittal · 3.0mm · 0.82mm/px · 6 of 13 slices shown]
[im 1/13]
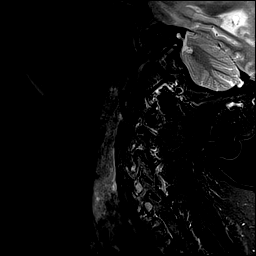
[im 3/13]
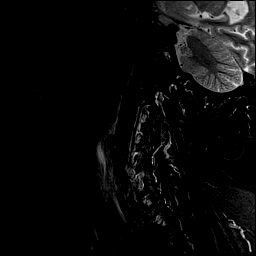
[im 5/13]
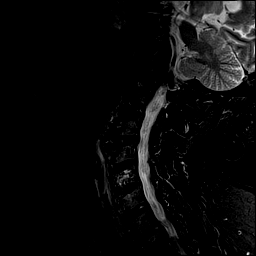
[im 8/13]
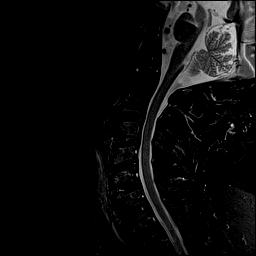
[im 10/13]
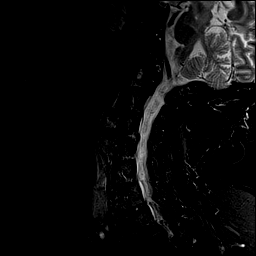
[im 13/13]
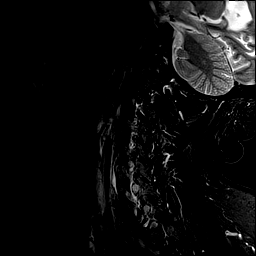

[Series 5: GRE · axial · 3.0mm · 0.35mm/px · z∈[-73,-59]mm · 2 of 32 slices shown]
[im 1/32]
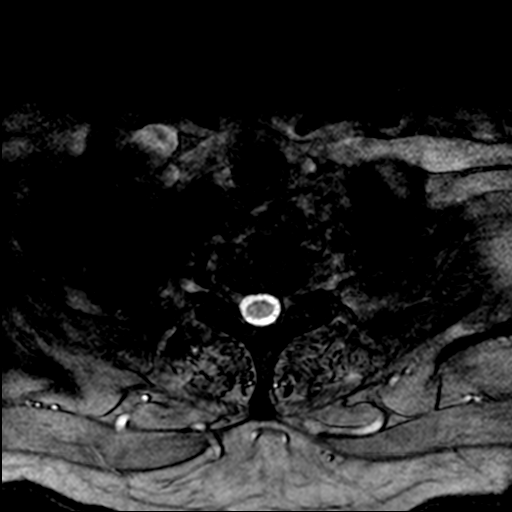
[im 5/32]
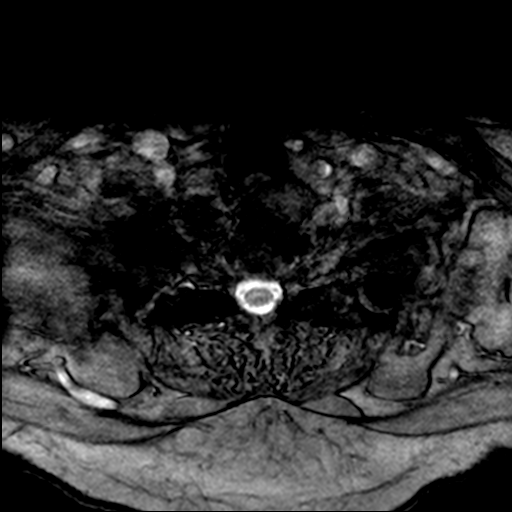

[Series 6: T2 · axial · 3.0mm · 0.70mm/px · z∈[-73,+38]mm · 9 of 32 slices shown (2 of 2)]
[im 1/32]
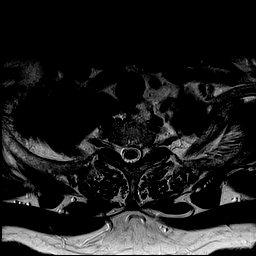
[im 5/32]
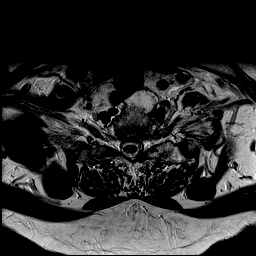
[im 9/32]
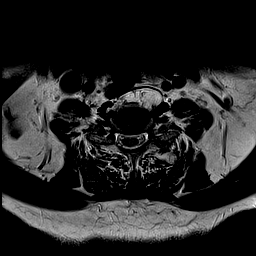
[im 14/32]
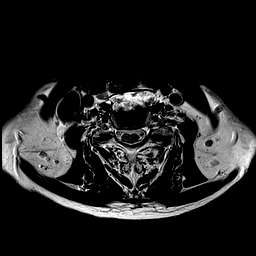
[im 16/32]
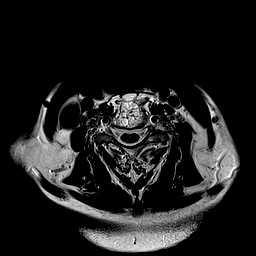
[im 18/32]
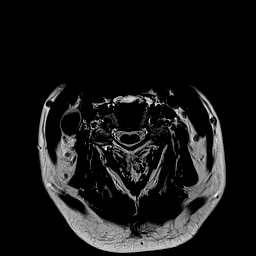
[im 23/32]
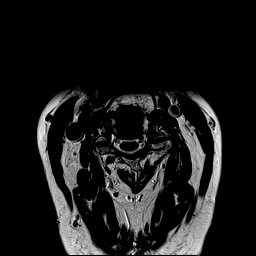
[im 27/32]
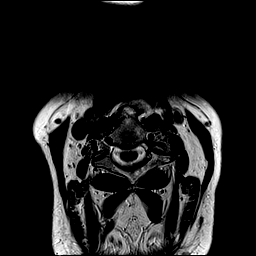
[im 32/32]
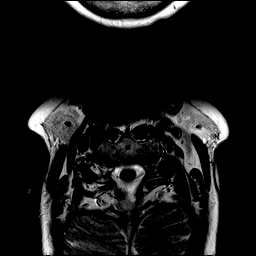

[29 of 48 positions shown; findings below may reference images not displayed]

FINDINGS: Alignment: Normal

Vertebrae: There is ankylosis of the cervical spine with bulky
anterior osteophytes extending from C2 to the visualized upper
thoracic spine.

Cord: Normal signal and morphology.

Posterior Fossa, vertebral arteries, paraspinal tissues: There is
mass effect on the esophagus by the bulky anterior osteophytes in
the lower cervical spine.

Disc levels:

C1-C2: Unremarkable.

C2-C3: Normal disc space and facet joints. There is no spinal canal
stenosis. No neural foraminal stenosis.

C3-C4: Mild left uncovertebral hypertrophy. There is no spinal canal
stenosis. Mild left neural foraminal stenosis.

C4-C5: Normal disc space. Moderate facet hypertrophy,
left-greater-than-right. There is no spinal canal stenosis. No
neural foraminal stenosis.

C5-C6: Normal disc space and facet joints. There is no spinal canal
stenosis. No neural foraminal stenosis.

C6-C7: Normal disc space and facet joints. There is no spinal canal
stenosis. No neural foraminal stenosis.

C7-T1: Normal disc space and facet joints. There is no spinal canal
stenosis. No neural foraminal stenosis.
IMPRESSION: 1. Mild left C3-4 neural foraminal stenosis secondary to
uncovertebral hypertrophy.
2. Ankylosis of the cervical spine along its entire length.
3. No spinal canal or neural foraminal stenosis.

## 2020-01-13 ENCOUNTER — Encounter: Payer: Self-pay | Admitting: Orthopaedic Surgery

## 2020-01-13 ENCOUNTER — Other Ambulatory Visit: Payer: Self-pay

## 2020-01-13 ENCOUNTER — Other Ambulatory Visit: Payer: Self-pay | Admitting: Radiology

## 2020-01-13 ENCOUNTER — Ambulatory Visit (INDEPENDENT_AMBULATORY_CARE_PROVIDER_SITE_OTHER): Payer: Medicare Other | Admitting: Orthopaedic Surgery

## 2020-01-13 DIAGNOSIS — M542 Cervicalgia: Secondary | ICD-10-CM

## 2020-01-13 DIAGNOSIS — M4807 Spinal stenosis, lumbosacral region: Secondary | ICD-10-CM

## 2020-01-13 NOTE — Progress Notes (Signed)
The patient comes in today to go over MRIs of her cervical spine and lumbar spine.  She has been having swallowing difficulties and decreased motion of her cervical spine.  Her plain films showed significant bridging of osteophytes throughout the entire cervical spine.  She is also been having low back pain and radicular symptoms going down more of her right leg and her left leg.  I replaced both her hips.  She has had injections in her lumbar spine in the past.  She has never been diagnosed with any rheumatologic condition either.  She still reports the same swelling and some breathing difficulties.  She denies being a smoker.  On exam she has limited rotation and flexion of her cervical spine.  She has excellent strength in her bilateral upper extremities today and no deficits in muscle tone and no sensory deficits in the upper extremities.  Examination of her lower extremities shows she has fluid range of motion of both hips and both knees.  She is able to demonstrate normal flexion extension at her feet and toes as well with good strength and no decreased sensation in her feet.  MRI of her lumbar spine does show an old compression deformity at L1.  There is moderate stenosis at L4-L5 and L5-S1 more so to the left side with a large osteophyte at the L foramina suggesting potential for nerve impingement.  More of her numbness is on the right side though.  Based on my clinical exam some of this may be related to her previous right hip replacement.  At this point we need to refer her to a rheumatologist to assess for ankylosing spondylitis and for any medical treatment for this.  Given her swelling difficulties I would still like to send her for a neurosurgical evaluation to assess the ankylosing spondylitis of her cervical spine as well as assess her lumbar spine for any other treatment options given the fact that she has failed an epidural steroid injection.  From my standpoint I do not need to see her back  for 6 months unless she is having any other issues as it relates to her hip replacements.  At that visit I would like a standing low AP pelvis.

## 2020-04-13 NOTE — Progress Notes (Signed)
Office Visit Note  Patient: Judy SnellCatherine A Wempe             Date of Birth: Sep 21, 1951           MRN: [id number]             PCP: Wynn BankerKoberlein, Junell C, MD Referring: Kathryne HitchBlackman, Christopher Y* Visit Date: 04/26/2020 Occupation: @GUAROCC @  Subjective:  Neck and lower back pain.   History of Present Illness: Judy [name] is a 69 y.o. female seen in consultation per request of Dr. Magnus IvanBlackman.  According to the patient her symptoms of neck and lower back pain started in her 40s.  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  At the time she is to do very heavy lifting and also work in Nature conservation officerthe construction.  She states she is to go to the chiropractor on a regular basis.  About 2 years ago she started having difficulty walking due to bilateral hip joint pain.  At the time she was seen by Dr. Magnus IvanBlackman who diagnosed her with bilateral avascular necrosis of the hip and she had bilateral total hip replacement.  She states she had excellent results from the hip replacement.  Her neck and lower back pain persist.  She states for the lower back pain she had injection about 6 weeks ago which has helped her lower back.  She has some weakness in her lower back but no pain currently.  She states she has limited range of motion of her cervical spine but not much discomfort she has some stiffness.  None of the other joints are painful.  There is no family history of autoimmune disease.  She denies any history of psoriasis or ankylosing spondylitis.  Her first cousin has Crohn's disease.  Activities of Daily Living:  Patient reports morning stiffness for 24 hours.   Patient Denies nocturnal pain.  Difficulty dressing/grooming: Reports Difficulty climbing stairs: Reports Difficulty getting out of chair: Reports Difficulty using hands for taps, buttons, cutlery, and/or writing: Denies  Review of Systems  Constitutional: Positive for fatigue. Negative for night sweats, weight gain and weight loss.  HENT:  Negative for mouth sores, trouble swallowing, trouble swallowing, mouth dryness and nose dryness.   Eyes: Negative for pain, redness, itching, visual disturbance and dryness.  Respiratory: Positive for cough, shortness of breath and difficulty breathing.        Smoker's cough  Cardiovascular: Negative for chest pain, palpitations, hypertension, irregular heartbeat and swelling in legs/feet.  Gastrointestinal: Negative for blood in stool, constipation and diarrhea.  Endocrine: Negative for increased urination.  Genitourinary: Negative for difficulty urinating and vaginal dryness.  Musculoskeletal: Positive for arthralgias, joint pain and morning stiffness. Negative for joint swelling, myalgias, muscle weakness, muscle tenderness and myalgias.  Skin: Negative for color change, rash, hair loss, redness, skin tightness, ulcers and sensitivity to sunlight.  Allergic/Immunologic: Negative for susceptible to infections.  Neurological: Positive for numbness, headaches and weakness. Negative for dizziness, memory loss and night sweats.  Hematological: Negative for bruising/bleeding tendency and swollen glands.  Psychiatric/Behavioral: Negative for depressed mood, confusion and sleep disturbance. The patient is not nervous/anxious.     PMFS History:  Patient Active Problem List   Diagnosis Date Noted  . 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  . Status post total replacement of left hip 03/19/2019  . Avascular necrosis of bone of hip, 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  . PTSD (post-traumatic stress  disorder)   . COPD (chronic obstructive pulmonary disease) (HCC)   . Bipolar 1 disorder (HCC)   . Anxiety   . B12 deficiency 12/09/2018  . COPD with acute exacerbation (HCC) 04/14/2017  . Hyperglycemia 04/14/2017    Past Medical History:  Diagnosis Date  . Anxiety   . Arthritis   . Avascular necrosis (HCC)    Left hip  . Bipolar 1  disorder (HCC)   . COPD (chronic obstructive pulmonary disease) (HCC)   . Pneumonia   . PTSD (post-traumatic stress disorder)     Family History  Problem Relation Age of Onset  . CAD Mother 36  . COPD Mother   . Depression Mother   . Heart attack Father   . Colon polyps Sister   . Heart attack Maternal Grandmother   . Early death Maternal Grandfather   . Hearing loss Paternal Grandfather   . Bipolar disorder Son   . Asthma Daughter    Past Surgical History:  Procedure Laterality Date  . bone spurs foot Right   . COLONOSCOPY    . HAND SURGERY Left   . JOINT REPLACEMENT    . TONSILLECTOMY    . TONSILLECTOMY AND ADENOIDECTOMY    . TOTAL HIP ARTHROPLASTY Left 03/19/2019   Procedure: LEFT TOTAL HIP ARTHROPLASTY ANTERIOR APPROACH;  Surgeon: [name], MD;  Location: [facility];  Service: Orthopedics;  Laterality: Left;  . TOTAL HIP ARTHROPLASTY Right 06/04/2019   Procedure: RIGHT TOTAL HIP ARTHROPLASTY ANTERIOR APPROACH;  Surgeon: [name], MD;  Location: [facility];  Service: Orthopedics;  Laterality: Right;   Social History   Social History Narrative  . Not on file   Immunization History  Administered Date(s) Administered  . Influenza, High Dose Seasonal PF 08/25/2018  . Pneumococcal Conjugate-13 09/23/2018  . Pneumococcal Polysaccharide-23 08/27/2016  . Tdap 10/07/2012  . Yellow Fever 07/05/2008     Objective: Vital Signs: BP 120/76 (BP Location: Right Arm, Patient Position: Sitting, Cuff Size: Normal)   Pulse 86   Resp 19   Ht 5' 8.75" (1.746 m)   Wt 187 lb (84.8 kg)   BMI 27.82 kg/m    Physical Exam Vitals and nursing note reviewed.  Constitutional:      Appearance: She is well-developed.  HENT:     Head: Normocephalic and atraumatic.  Eyes:     Conjunctiva/sclera: Conjunctivae normal.  Cardiovascular:     Rate and Rhythm: Normal rate and regular rhythm.     Heart sounds: Normal heart sounds.  Pulmonary:     Effort: Pulmonary effort  is normal.     Breath sounds: Normal breath sounds.  Abdominal:     General: Bowel sounds are normal.     Palpations: Abdomen is soft.  Musculoskeletal:     Cervical back: Normal range of motion.  Lymphadenopathy:     Cervical: No cervical adenopathy.  Skin:    General: Skin is warm and dry.     Capillary Refill: Capillary refill takes less than 2 seconds.  Neurological:     Mental Status: She is alert and oriented to person, place, and time.  Psychiatric:        Behavior: Behavior normal.      Musculoskeletal Exam: Patient has very limited extension, flexion and lateral rotation of her cervical spine.  She has limited range of motion of her thoracic spine.  Schober's was positive.  She had minimal tenderness over SI joints.  Shoulder joint abduction was about 110 degrees bilaterally.  She is about 5 degrees contractures in her bilateral elbows.  Wrist joints are in good range of motion.  She has mild PIP and DIP thickening.  Bilateral hip joints are replaced.  Knee joints with good range of motion without warmth swelling or effusion.  She has good range of motion of her ankle joints.  PIP and DIP thickening with no synovitis was noted.  CDAI Exam: CDAI Score: -- Patient Global: --; Provider Global: -- Swollen: --; Tender: -- Joint Exam 04/26/2020   No joint exam has been documented for this visit   There is currently no information documented on the homunculus. Go to the Rheumatology activity and complete the homunculus joint exam.  Investigation: No additional findings.  Imaging: XR Pelvis 1-2 Views  Result Date: 04/26/2020 Bilateral hip show prosthesis in place.  Right sacroiliac joint joint is fused.  Left hip joint does not show any significant narrowing. Impression: Right SI joint fusion was noted.   Recent Labs: Lab Results  Component Value Date   WBC 12.1 (H) 06/05/2019   HGB 11.6 (L) 06/05/2019   PLT 241 06/05/2019   NA 138 06/05/2019   K 5.0 06/05/2019   CL  106 06/05/2019   CO2 24 06/05/2019   GLUCOSE 140 (H) 06/05/2019   BUN 9 06/05/2019   CREATININE 0.79 06/05/2019   BILITOT 0.3 12/11/2018   ALKPHOS 139 (H) 12/11/2018   AST 10 12/11/2018   ALT 14 12/11/2018   PROT 5.8 (L) 12/11/2018   ALBUMIN 3.8 12/11/2018   CALCIUM 9.5 06/05/2019   GFRAA >60 06/05/2019    Speciality Comments: No specialty comments available.  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  She also had right SI joint fusion.  She has contractures in her bilateral elbows and also limited range of motion in her shoulder joints.  I will obtain labs today.  I reviewed x-rays, MRI and CT results with Dr. Pecolia Ades.  Who also agreed with the diagnosis of ankylosing spondylitis.   Ankylosis of spine - Evident on C-spine MRI on 01/06/20.  I reviewed the MRI findings which mentions degenerative changes.  I also reviewed x-ray of the cervical spine which showed ankylosis of the cervical spine.  DDD (degenerative disc disease), lumbar-multilevel spondylosis was noted.  Syndesmophytes were noted.  Spinal stenosis of lumbosacral region -patient had recent injection.  Chronic SI joint pain - Plan: XR Pelvis 1-2 Views.  She did not have much tenderness over SI joints.  The x-ray of the pelvis showed right SI joint fusion.  High risk medication use-I will obtain some additional labs today in case we have to start her on medications in the future.  Avascular necrosis of bone of hip, left (HCC)  Status post total replacement of left hip-doing well.  Avascular necrosis of bone of right hip (HCC)  Status post total replacement of right hip-doing well.  Contracture of joint of both elbows  History of  COPD  Smoker - 2 PPD X 52 years. Now 5 cig /d  B12 deficiency  PTSD (post-traumatic stress disorder)  Bipolar 1 disorder (HCC)  History of anxiety  Orders: Orders Placed This Encounter  Procedures  . XR Pelvis 1-2 Views  . CBC with Differential/Platelet  . COMPLETE METABOLIC PANEL WITH GFR  .  Sedimentation rate  . Hepatitis B core antibody, IgM  . Hepatitis B surface antigen  . HIV Antibody (routine testing w rflx)  . QuantiFERON-TB Gold Plus  . Serum protein electrophoresis with reflex  . IgG, IgA, IgM   No orders of the defined types were placed in this encounter.     Follow-Up Instructions: Return for Ankylosing spondylitis.   Pollyann Savoy, MD  Note - This record has been created using Animal nutritionist.  Chart creation errors have been sought, but may not always  have been located. Such creation errors do not reflect on  the standard of medical care.

## 2020-04-20 ENCOUNTER — Telehealth: Payer: Self-pay | Admitting: Family Medicine

## 2020-04-20 ENCOUNTER — Other Ambulatory Visit: Payer: Self-pay | Admitting: Family Medicine

## 2020-04-20 DIAGNOSIS — J449 Chronic obstructive pulmonary disease, unspecified: Secondary | ICD-10-CM

## 2020-04-20 NOTE — Telephone Encounter (Signed)
Patient prefers a pulmonologist in [place].

## 2020-04-20 NOTE — Telephone Encounter (Signed)
I have placed referral, but I am also happy to see her for this and help her while she is awaiting specialty care.

## 2020-04-20 NOTE — Telephone Encounter (Signed)
Patient is requesting a referral to a pulmonologist.  Pt has COPD and is having trouble breathing some recently.

## 2020-04-20 NOTE — Telephone Encounter (Signed)
Spoke with the pt, informed her the referral was placed and offered a virtual visit while awaiting an appt.  Patient declined and stated she will await the appt with the specialist.

## 2020-04-26 ENCOUNTER — Telehealth: Payer: Self-pay | Admitting: *Deleted

## 2020-04-26 ENCOUNTER — Other Ambulatory Visit: Payer: Self-pay

## 2020-04-26 ENCOUNTER — Ambulatory Visit (INDEPENDENT_AMBULATORY_CARE_PROVIDER_SITE_OTHER): Payer: Medicare Other | Admitting: Rheumatology

## 2020-04-26 ENCOUNTER — Encounter: Payer: Self-pay | Admitting: Rheumatology

## 2020-04-26 ENCOUNTER — Ambulatory Visit: Payer: Self-pay

## 2020-04-26 VITALS — BP 120/76 | HR 86 | Resp 19 | Ht 68.75 in | Wt 187.0 lb

## 2020-04-26 DIAGNOSIS — M87051 Idiopathic aseptic necrosis of right femur: Secondary | ICD-10-CM

## 2020-04-26 DIAGNOSIS — F319 Bipolar disorder, unspecified: Secondary | ICD-10-CM

## 2020-04-26 DIAGNOSIS — M45 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: Secondary | ICD-10-CM

## 2020-04-26 DIAGNOSIS — Z8659 Personal history of other mental and behavioral disorders: Secondary | ICD-10-CM

## 2020-04-26 DIAGNOSIS — M4320 Fusion of spine, site unspecified: Secondary | ICD-10-CM

## 2020-04-26 DIAGNOSIS — M24522 Contracture, left elbow: Secondary | ICD-10-CM

## 2020-04-26 DIAGNOSIS — M5136 Other intervertebral disc degeneration, lumbar region: Secondary | ICD-10-CM

## 2020-04-26 DIAGNOSIS — M4807 Spinal stenosis, lumbosacral region: Secondary | ICD-10-CM | POA: Diagnosis not present

## 2020-04-26 DIAGNOSIS — M533 Sacrococcygeal disorders, not elsewhere classified: Secondary | ICD-10-CM

## 2020-04-26 DIAGNOSIS — G8929 Other chronic pain: Secondary | ICD-10-CM | POA: Diagnosis not present

## 2020-04-26 DIAGNOSIS — Z79899 Other long term (current) drug therapy: Secondary | ICD-10-CM

## 2020-04-26 DIAGNOSIS — F4310 Post-traumatic stress disorder, unspecified: Secondary | ICD-10-CM

## 2020-04-26 DIAGNOSIS — E538 Deficiency of other specified B group vitamins: Secondary | ICD-10-CM

## 2020-04-26 DIAGNOSIS — M24521 Contracture, right elbow: Secondary | ICD-10-CM

## 2020-04-26 DIAGNOSIS — F17200 Nicotine dependence, unspecified, uncomplicated: Secondary | ICD-10-CM

## 2020-04-26 DIAGNOSIS — Z96642 Presence of left artificial hip joint: Secondary | ICD-10-CM

## 2020-04-26 DIAGNOSIS — M51369 Other intervertebral disc degeneration, lumbar region without mention of lumbar back pain or lower extremity pain: Secondary | ICD-10-CM

## 2020-04-26 DIAGNOSIS — Z96641 Presence of right artificial hip joint: Secondary | ICD-10-CM

## 2020-04-26 DIAGNOSIS — Z8709 Personal history of other diseases of the respiratory system: Secondary | ICD-10-CM

## 2020-04-26 DIAGNOSIS — M87052 Idiopathic aseptic necrosis of left femur: Secondary | ICD-10-CM

## 2020-04-26 NOTE — Telephone Encounter (Signed)
Noted. Thanks.

## 2020-04-26 NOTE — Telephone Encounter (Signed)
I placed referral back on 7/15; I don't see it is processed. I am copying deborah on this to look into this please.   If worsening breathing, needs to be seen. Option of post-covid clinic for eval if active sick symptoms.

## 2020-04-26 NOTE — Telephone Encounter (Signed)
Spoke with the pt and she stated she was seen at an urgent care yesterday.  Stated she was diagnosed with bronchitis, given a nebulizer treatment and prescriptions for an inhaler and antibiotic was given.  Patient is aware someone will call with appt info for the referral to pulmonary and message sent to PCP as FYI.

## 2020-04-26 NOTE — Telephone Encounter (Signed)
Patient called after hours line. Patient reports she called Thursday about a referral to pulmonologist, called earlier this morning, has COPD and has been coughing, wheezing, pulse ox down to 94. she called Thursday about a referral to pulmonologist, called earlier this morning, has COPD and has been coughing, wheezing, pulse ox down to 94. Patient was advised to go to ED. Patient declined and want an appointment at the office and not go to ED.     Clinic RN does not see where patient went to ED. Please advise

## 2020-04-29 LAB — SEDIMENTATION RATE: Sed Rate: 60 mm/h — ABNORMAL HIGH (ref 0–30)

## 2020-04-29 LAB — QUANTIFERON-TB GOLD PLUS
Mitogen-NIL: >10 IU/mL
NIL: 0.04 IU/mL
QuantiFERON-TB Gold Plus: NEGATIVE
TB1-NIL: 0.02 IU/mL
TB2-NIL: 0 IU/mL

## 2020-04-29 LAB — CBC WITH DIFFERENTIAL/PLATELET
Absolute Monocytes: 420 cells/uL (ref 200–950)
Basophils Absolute: 28 cells/uL (ref 0–200)
Basophils Relative: 0.5 %
Eosinophils Absolute: 101 cells/uL (ref 15–500)
Eosinophils Relative: 1.8 %
HCT: 38.3 % (ref 35.0–45.0)
Hemoglobin: 12.6 g/dL (ref 11.7–15.5)
Lymphs Abs: 1176 cells/uL (ref 850–3900)
MCH: 29.8 pg (ref 27.0–33.0)
MCHC: 32.9 g/dL (ref 32.0–36.0)
MCV: 90.5 fL (ref 80.0–100.0)
MPV: 9.7 fL (ref 7.5–12.5)
Monocytes Relative: 7.5 %
Neutro Abs: 3875 cells/uL (ref 1500–7800)
Neutrophils Relative %: 69.2 %
Platelets: 270 10*3/uL (ref 140–400)
RBC: 4.23 10*6/uL (ref 3.80–5.10)
RDW: 12.9 % (ref 11.0–15.0)
Total Lymphocyte: 21 %
WBC: 5.6 10*3/uL (ref 3.8–10.8)

## 2020-04-29 LAB — COMPLETE METABOLIC PANEL WITH GFR
AG Ratio: 1.5 (calc) (ref 1.0–2.5)
ALT: 16 U/L (ref 6–29)
AST: 16 U/L (ref 10–35)
Albumin: 3.8 g/dL (ref 3.6–5.1)
Alkaline phosphatase (APISO): 126 U/L (ref 37–153)
BUN: 9 mg/dL (ref 7–25)
CO2: 26 mmol/L (ref 20–32)
Calcium: 9.9 mg/dL (ref 8.6–10.4)
Chloride: 105 mmol/L (ref 98–110)
Creat: 0.97 mg/dL (ref 0.50–0.99)
GFR, Est African American: 70 mL/min/{1.73_m2} (ref >=60)
GFR, Est Non African American: 60 mL/min/{1.73_m2} (ref >=60)
Globulin: 2.5 g/dL (calc) (ref 1.9–3.7)
Glucose, Bld: 96 mg/dL (ref 65–99)
Potassium: 4.5 mmol/L (ref 3.5–5.3)
Sodium: 141 mmol/L (ref 135–146)
Total Bilirubin: 0.4 mg/dL (ref 0.2–1.2)
Total Protein: 6.3 g/dL (ref 6.1–8.1)

## 2020-04-29 LAB — PROTEIN ELECTROPHORESIS, SERUM, WITH REFLEX
Albumin ELP: 3.5 g/dL — ABNORMAL LOW (ref 3.8–4.8)
Alpha 1: 0.4 g/dL — ABNORMAL HIGH (ref 0.2–0.3)
Alpha 2: 1 g/dL — ABNORMAL HIGH (ref 0.5–0.9)
Beta 2: 0.3 g/dL (ref 0.2–0.5)
Beta Globulin: 0.4 g/dL (ref 0.4–0.6)
Gamma Globulin: 0.7 g/dL — ABNORMAL LOW (ref 0.8–1.7)
Total Protein: 6.3 g/dL (ref 6.1–8.1)

## 2020-04-29 LAB — HIV ANTIBODY (ROUTINE TESTING W REFLEX): HIV 1&2 Ab, 4th Generation: NONREACTIVE

## 2020-04-29 LAB — HEPATITIS B SURFACE ANTIGEN: Hepatitis B Surface Ag: NONREACTIVE

## 2020-04-29 LAB — IGG, IGA, IGM
IgG (Immunoglobin G), Serum: 824 mg/dL (ref 600–1540)
IgM, Serum: 47 mg/dL — ABNORMAL LOW (ref 50–300)
Immunoglobulin A: 120 mg/dL (ref 70–320)

## 2020-04-29 LAB — HEPATITIS B CORE ANTIBODY, IGM: Hep B C IgM: NONREACTIVE

## 2020-04-30 NOTE — Progress Notes (Signed)
I will discuss results at the follow-up visit.

## 2020-05-08 NOTE — Progress Notes (Signed)
Office Visit Note  [name]             Date of Birth: [birth year]           MRN: [id number]             PCP: Caren Macadam, MD Referring: Caren Macadam, MD Visit Date: 05/22/2020 Occupation: _0 @  Subjective:  Stiffness in multiple joints and entire spine.Marland Kitchen   History of Present Illness: Judy [name] is a 69 y.o. female with ankylosing spondylitis and osteoarthritis.  She continues to have pain and stiffness in her entire spine.  She has limited range of motion.  She states she has steroid injections to her lower back and lower back pain has improved.  She has been seeing Dr. Vaughan Browner for COPD.  Activities of Daily Living:  Patient reports morning stiffness for 0 minutes.   Patient Denies nocturnal pain.  Difficulty dressing/grooming: Reports Difficulty climbing stairs: Reports Difficulty getting out of chair: Reports Difficulty using hands for taps, buttons, cutlery, and/or writing: Denies  Review of Systems  Constitutional: Negative for fatigue.  HENT: Negative for mouth sores, mouth dryness and nose dryness.   Eyes: Negative for itching and dryness.  Respiratory: Positive for shortness of breath and difficulty breathing. Negative for wheezing.   Cardiovascular: Negative for chest pain and palpitations.  Gastrointestinal: Negative for blood in stool, constipation and diarrhea.  Endocrine: Negative for increased urination.  Genitourinary: Negative for difficulty urinating.  Musculoskeletal: Positive for arthralgias, joint pain and muscle weakness. Negative for joint swelling, myalgias, morning stiffness, muscle tenderness and myalgias.  Skin: Negative for color change, rash and redness.  Allergic/Immunologic: Negative for susceptible to infections.  Neurological: Positive for numbness and memory loss. Negative for dizziness, headaches and weakness.  Hematological: Negative for bruising/bleeding tendency.  Psychiatric/Behavioral:  Negative for confusion.    PMFS History:  Patient Active Problem List   Diagnosis Date Noted  . 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 Avascular necrosis of bone of right hip ([place]) 04/28/2019  . Status post total replacement of left hip 03/19/2019  . Avascular necrosis of bone of hip, left ([facility]) 12/28/2018  . PTSD (post-traumatic stress disorder)   . COPD (chronic obstructive pulmonary disease) ([place])   . Bipolar 1 disorder ([place])   . Anxiety   . B12 deficiency 12/09/2018  . COPD with acute exacerbation ([place]) 04/14/2017  . Hyperglycemia 04/14/2017    Past Medical History:  Diagnosis Date  . Anxiety   . Arthritis   . Avascular necrosis (HCC)    Left hip  . Bipolar 1 disorder ([place])   . COPD (chronic obstructive pulmonary disease) ([place])   . Pneumonia   . PTSD (post-traumatic stress disorder)     Family History  Problem Relation Age of Onset  . CAD Mother 30  . COPD Mother   . Depression Mother   . Heart attack Father   . Colon polyps Sister   . Heart attack Maternal Grandmother   . Early death Maternal Grandfather   . Hearing loss Paternal Grandfather   . Bipolar disorder Son   . Asthma Daughter    Past Surgical History:  Procedure Laterality Date  . bone spurs foot Right   . COLONOSCOPY    . HAND SURGERY Left   . JOINT REPLACEMENT    . TONSILLECTOMY    . TONSILLECTOMY AND ADENOIDECTOMY    . TOTAL HIP ARTHROPLASTY Left 03/19/2019   Procedure: LEFT TOTAL HIP ARTHROPLASTY  ANTERIOR APPROACH;  Surgeon: Mcarthur Rossetti, MD;  Location: [facility];  Service: Orthopedics;  Laterality: Left;  . TOTAL HIP ARTHROPLASTY Right 06/04/2019   Procedure: RIGHT TOTAL HIP ARTHROPLASTY ANTERIOR APPROACH;  Surgeon: Mcarthur Rossetti, MD;  Location: [facility];  Service: Orthopedics;  Laterality: Right;   Social History   Social History Narrative  . Not on file   Immunization History  Administered Date(s) Administered  . Influenza, High Dose Seasonal PF  08/25/2018  . Moderna SARS-COVID-2 Vaccination 11/08/2019, 12/05/2019  . Pneumococcal Conjugate-13 09/23/2018  . Pneumococcal Polysaccharide-23 08/27/2016  . Tdap 10/07/2012  . Yellow Fever 07/05/2008     Objective: Vital Signs: BP 120/76 (BP Location: Left Arm, Patient Position: Sitting, Cuff Size: Normal)   Pulse 83   Resp 18   Ht _0  (1.778 m)   Wt 192 lb (87.1 kg)   BMI 27.55 kg/m    Physical Exam Vitals and nursing note reviewed.  Constitutional:      Appearance: She is well-developed.  HENT:     Head: Normocephalic and atraumatic.  Eyes:     Conjunctiva/sclera: Conjunctivae normal.  Cardiovascular:     Rate and Rhythm: Normal rate and regular rhythm.     Heart sounds: Normal heart sounds.  Pulmonary:     Effort: Pulmonary effort is normal.     Breath sounds: Normal breath sounds.  Abdominal:     General: Bowel sounds are normal.     Palpations: Abdomen is soft.  Musculoskeletal:     Cervical back: Normal range of motion.  Lymphadenopathy:     Cervical: No cervical adenopathy.  Skin:    General: Skin is warm and dry.     Capillary Refill: Capillary refill takes less than 2 seconds.  Neurological:     Mental Status: She is alert and oriented to person, place, and time.  Psychiatric:        Behavior: Behavior normal.      Musculoskeletal Exam: She has very limited range of motion of her cervical spine, thoracic and lumbar spine.  Schober's was positive.  She had no tenderness over SI joints today due to recent injections.  Shoulder joint abduction is limited.  She has contractures in her bilateral elbows.  She has PIP and DIP thickening.  Hip joints are replaced.  Knee joints with good range of motion.  There was no tenderness over MTPs.  CDAI Exam: CDAI Score: -- Patient Global: --; Provider Global: -- Swollen: --; Tender: -- Joint Exam 05/22/2020   No joint exam has been documented for this visit   There is currently no information documented on the  homunculus. Go to the Rheumatology activity and complete the homunculus joint exam.  Investigation: No additional findings.  Imaging: XR Pelvis 1-2 Views  Result Date: 04/26/2020 Bilateral hip show prosthesis in place.  Right sacroiliac joint joint is fused.  Left hip joint does not show any significant narrowing. Impression: Right SI joint fusion was noted.   Recent Labs: Lab Results  Component Value Date   WBC 5.6 04/26/2020   HGB 12.6 04/26/2020   PLT 270 04/26/2020   NA 141 04/26/2020   K 4.5 04/26/2020   CL 105 04/26/2020   CO2 26 04/26/2020   GLUCOSE 96 04/26/2020   BUN 9 04/26/2020   CREATININE 0.97 04/26/2020   BILITOT 0.4 04/26/2020   ALKPHOS 139 (H) 12/11/2018   AST 16 04/26/2020   ALT 16 04/26/2020   PROT 6.3 04/26/2020   PROT 6.3 04/26/2020  ALBUMIN 3.8 12/11/2018   CALCIUM 9.9 04/26/2020   GFRAA 70 04/26/2020   QFTBGOLDPLUS NEGATIVE 04/26/2020   April 26, 2020 SPEP normal, immunoglobulins normal except IgM 47, TB Gold negative, hepatitis B-, HIV negative, ESR 60 December 11, 2018 hepatitis C negative Speciality Comments: No specialty comments available.  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  She has fusion of entire spine.  Today we had detailed discussion regarding different treatment options.  She would benefit from anti-TNF's.  Indications side effects contraindications of different anti-TNF's were discussed.  Patient wants to proceed with an office Cimzia injections.  We will apply for Cimzia.  Detailed counseling was provided.  We will apply for Cimzia.   High risk medication use-all the labs obtained at the last visit were discussed at length.  DDD (degenerative disc disease), lumbar-she has chronic pain.  She had recent  steroid injections and is feeling better.  Spinal stenosis of lumbosacral region-chronic pain.  Chronic SI joint pain-she has noticed improvement in the discomfort.  Status post total replacement of left hip-pain is manageable.  Avascular necrosis of bone of hip, left (HCC)  Avascular necrosis of bone of right hip (HCC)  Status post total replacement of right hip  Contracture of joint of both elbows  Pulmonary nodules - Followed by with Dr. Vaughan Browner  History of COPD  Smoker  PTSD (post-traumatic stress disorder)  Bipolar 1 disorder ([place])  B12 deficiency  Orders: No orders of the defined types were placed in this encounter.  No orders of the defined types were placed in this encounter.   Follow-Up Instructions: Return in about 6 weeks (around 07/03/2020) for Ankylosing spondylitis.   Bo Merino, MD  Note - This record has been created using Editor, commissioning.  Chart creation errors have been sought, but may not always  have been located. Such creation errors do not reflect on  the standard of medical care.

## 2020-05-10 ENCOUNTER — Encounter: Payer: Self-pay | Admitting: Primary Care

## 2020-05-10 ENCOUNTER — Other Ambulatory Visit: Payer: Self-pay

## 2020-05-10 ENCOUNTER — Ambulatory Visit (INDEPENDENT_AMBULATORY_CARE_PROVIDER_SITE_OTHER): Payer: Medicare Other | Admitting: Primary Care

## 2020-05-10 ENCOUNTER — Telehealth: Payer: Self-pay | Admitting: Primary Care

## 2020-05-10 DIAGNOSIS — J449 Chronic obstructive pulmonary disease, unspecified: Secondary | ICD-10-CM

## 2020-05-10 MED ORDER — ALBUTEROL SULFATE HFA 108 (90 BASE) MCG/ACT IN AERS: 1.0000 | INHALATION_SPRAY | Freq: Four times a day (QID) | RESPIRATORY_TRACT | 2 refills | Status: DC | PRN

## 2020-05-10 MED ORDER — STIOLTO RESPIMAT 2.5-2.5 MCG/ACT IN AERS: 2.0000 | INHALATION_SPRAY | Freq: Every day | RESPIRATORY_TRACT | 0 refills | Status: DC

## 2020-05-10 NOTE — Telephone Encounter (Signed)
What LAMA or LABA/LAMA is most affordable on patients insurance plan?

## 2020-05-10 NOTE — Progress Notes (Signed)
@Patient  ID: , female    DOB: 1951/08/01, 69 y.o.   MRN: [id number]  Chief Complaint  Patient presents with  . Follow-up    IPF, COPD    Referring provider: [id number], MD  HPI: 69 year old female, current every day smoker. PMH significant COPD, PTSD, bilateral hip replacement, bipolar 1 disorder. Patient of Dr. 73, last seen on 08/25/18. PFTs reviewed with mild obstruction, no bronchodilator response.  Labs show normal IgE and low peripheral eosinophils. She has a diagnosis of asthma but this looks more like COPD. Patient feels Trelegy was not helping, control inhaler stopped and patient to use Albuterol as needed.   05/10/2020 Patient presents today today for regular follow-up visit. She has not been seen in almost 2 years. She had COPD exacerbation 2 weeks ago. States that she had experienced symptoms of cough, wheezing and O2 level was 92% RA. She was seen at [facility] and given medrol dose back. She is feeling some better after completing zpack and oral steriods. She is still smoking 5 cigarettes, previously smoking 1ppd. She did quit when she had hip surgery.    Allergies  Allergen Reactions  . Celexa [Citalopram Hydrobromide]     "goes weird"  . Gabapentin     "goes weird"     Immunization History  Administered Date(s) Administered  . Influenza, High Dose Seasonal PF 08/25/2018  . Moderna SARS-COVID-2 Vaccination 11/08/2019, 12/05/2019  . Pneumococcal Conjugate-13 09/23/2018  . Pneumococcal Polysaccharide-23 08/27/2016  . Tdap 10/07/2012  . Yellow Fever 07/05/2008    Past Medical History:  Diagnosis Date  . Anxiety   . Arthritis   . Avascular necrosis (HCC)    Left hip  . Bipolar 1 disorder (HCC)   . COPD (chronic obstructive pulmonary disease) (HCC)   . Pneumonia   . PTSD (post-traumatic stress disorder)     Tobacco History: Social History   Tobacco Use  Smoking Status Current Every Day Smoker  . Years: 43.00  . Types: Cigarettes    . Last attempt to quit: 10/19/2018  . Years since quitting: 1.6  Smokeless Tobacco Never Used  Tobacco Comment   5 cigarettes a day at this time   Ready to quit: Not Answered Counseling given: Not Answered Comment: 5 cigarettes a day at this time   Outpatient Medications Prior to Visit  Medication Sig Dispense Refill  . cariprazine (VRAYLAR) capsule Take 6 mg by mouth daily.     . IBUPROFEN PO Take by mouth as needed.    10/21/2018 LORazepam (ATIVAN) 0.5 MG tablet Take 1.5 mg by mouth at bedtime.    Marland Kitchen lurasidone (LATUDA) 80 MG TABS tablet Take 80 mg by mouth every evening.     Marland Kitchen QUEtiapine (SEROQUEL) 300 MG tablet Take 800 mg by mouth at bedtime.     Marland Kitchen acetaminophen (TYLENOL) 500 MG tablet Take 1,500 mg by mouth every 8 (eight) hours as needed for moderate pain.     Marland Kitchen acetaminophen-codeine (TYLENOL #3) 300-30 MG tablet Take 1-2 tablets by mouth every 8 (eight) hours as needed for moderate pain. 40 tablet 0  . amoxicillin (AMOXIL) 500 MG tablet Take 2 tabs by mouth one hour before dental appointment, the 2 tabs by mouth six hours after dental appt 16 tablet 0  . doxycycline (VIBRAMYCIN) 100 MG capsule Take 100 mg by mouth 2 (two) times daily.    Marland Kitchen HYDROcodone-acetaminophen (NORCO/VICODIN) 5-325 MG tablet Take 1 tablet by mouth every 6 (six) hours as needed  for moderate pain. 30 tablet 0  . methocarbamol (ROBAXIN) 500 MG tablet Take 1 tablet (500 mg total) by mouth every 6 (six) hours as needed for muscle spasms. (Patient not taking: Reported on 04/26/2020) 40 tablet 0  . methylPREDNISolone (MEDROL DOSEPAK) 4 MG TBPK tablet Take by mouth.     No facility-administered medications prior to visit.   Review of Systems  Review of Systems  Constitutional: Negative.   Respiratory: Positive for cough and wheezing.   Musculoskeletal: Positive for arthralgias and neck pain.    Physical Exam  BP 112/72 (BP Location: Left Arm, Cuff Size: Normal)   Pulse 89   Temp (!) 97.3 F (36.3 C) (Oral)   Ht  5\' 10"  (1.778 m)   Wt 189 lb 3.2 oz (85.8 kg)   SpO2 98%   BMI 27.15 kg/m  Physical Exam Constitutional:      Appearance: Normal appearance.  HENT:     Head: Normocephalic and atraumatic.     Mouth/Throat:     Mouth: Mucous membranes are moist.     Pharynx: Oropharynx is clear.  Cardiovascular:     Rate and Rhythm: Normal rate and regular rhythm.  Pulmonary:     Effort: Pulmonary effort is normal.     Breath sounds: Normal breath sounds.     Comments: CTA Musculoskeletal:        General: Normal range of motion.  Skin:    General: Skin is warm and dry.     Comments: Arthritic changes to hands  Neurological:     General: No focal deficit present.     Mental Status: She is alert and oriented to person, place, and time. Mental status is at baseline.  Psychiatric:        Mood and Affect: Mood normal.        Behavior: Behavior normal.        Thought Content: Thought content normal.        Judgment: Judgment normal.      Lab Results:  CBC    Component Value Date/Time   WBC 5.6 04/26/2020 1126   RBC 4.23 04/26/2020 1126   HGB 12.6 04/26/2020 1126   HCT 38.3 04/26/2020 1126   PLT 270 04/26/2020 1126   MCV 90.5 04/26/2020 1126   MCH 29.8 04/26/2020 1126   MCHC 32.9 04/26/2020 1126   RDW 12.9 04/26/2020 1126   LYMPHSABS 1,176 04/26/2020 1126   MONOABS 0.5 12/11/2018 1023   EOSABS 101 04/26/2020 1126   BASOSABS 28 04/26/2020 1126    BMET    Component Value Date/Time   NA 141 04/26/2020 1126   K 4.5 04/26/2020 1126   CL 105 04/26/2020 1126   CO2 26 04/26/2020 1126   GLUCOSE 96 04/26/2020 1126   BUN 9 04/26/2020 1126   CREATININE 0.97 04/26/2020 1126   CALCIUM 9.9 04/26/2020 1126   GFRNONAA 60 04/26/2020 1126   GFRAA 70 04/26/2020 1126    BNP    Component Value Date/Time   BNP 62.9 08/22/2017 2328    ProBNP No results found for: PROBNP  Imaging: No results found.   Assessment & Plan:   COPD (chronic obstructive pulmonary disease) (HCC) - Patient  last seen in November 2019. Recent exacerbation treat with ZPACK and medrol dose pack prescribed by [facility] - Start trial Stiolto 2 puffs once daily in the morning - RX for Albuterol 2 puffs every 4-6 hours as needed for shortness of breath/wheezing - Continue to encourage patient quit smoking - FU  in 3 months     Glenford Bayley, NP 06/02/2020

## 2020-05-10 NOTE — Patient Instructions (Addendum)
Pleasure seeing you today Judy [name]  COPD: - Start trial Stiolto 2 puffs once daily in the morning - Sending in RX for Albuterol use as directed for shortness of breath/wheezing - I have sent a message to our pharmacist to see what inhaler is cheapest on your plan   Follow-up: 3 months with Dr. Isaiah Serge    Chronic Obstructive Pulmonary Disease Chronic obstructive pulmonary disease (COPD) is a long-term (chronic) lung problem. When you have COPD, it is hard for air to get in and out of your lungs. Usually the condition gets worse over time, and your lungs will never return to normal. There are things you can do to keep yourself as healthy as possible.  Your doctor may treat your condition with: ? Medicines. ? Oxygen. ? Lung surgery.  Your doctor may also recommend: ? Rehabilitation. This includes steps to make your body work better. It may involve a team of specialists. ? Quitting smoking, if you smoke. ? Exercise and changes to your diet. ? Comfort measures (palliative care). Follow these instructions at home: Medicines  Take over-the-counter and prescription medicines only as told by your doctor.  Talk to your doctor before taking any cough or allergy medicines. You may need to avoid medicines that cause your lungs to be dry. Lifestyle  If you smoke, stop. Smoking makes the problem worse. If you need help quitting, ask your doctor.  Avoid being around things that make your breathing worse. This may include smoke, chemicals, and fumes.  Stay active, but remember to rest as well.  Learn and use tips on how to relax.  Make sure you get enough sleep. Most adults need at least 7 hours of sleep every night.  Eat healthy foods. Eat smaller meals more often. Rest before meals. Controlled breathing Learn and use tips on how to control your breathing as told by your doctor. Try:  Breathing in (inhaling) through your nose for 1 second. Then, pucker your lips and breath out (exhale)  through your lips for 2 seconds.  Putting one hand on your belly (abdomen). Breathe in slowly through your nose for 1 second. Your hand on your belly should move out. Pucker your lips and breathe out slowly through your lips. Your hand on your belly should move in as you breathe out.  Controlled coughing Learn and use controlled coughing to clear mucus from your lungs. Follow these steps: 1. Lean your head a little forward. 2. Breathe in deeply. 3. Try to hold your breath for 3 seconds. 4. Keep your mouth slightly open while coughing 2 times. 5. Spit any mucus out into a tissue. 6. Rest and do the steps again 1 or 2 times as needed. General instructions  Make sure you get all the shots (vaccines) that your doctor recommends. Ask your doctor about a flu shot and a pneumonia shot.  Use oxygen therapy and pulmonary rehabilitation if told by your doctor. If you need home oxygen therapy, ask your doctor if you should buy a tool to measure your oxygen level (oximeter).  Make a COPD action plan with your doctor. This helps you to know what to do if you feel worse than usual.  Manage any other conditions you have as told by your doctor.  Avoid going outside when it is very hot, cold, or humid.  Avoid people who have a sickness you can catch (contagious).  Keep all follow-up visits as told by your doctor. This is important. Contact a doctor if:  You cough up  more mucus than usual.  There is a change in the color or thickness of the mucus.  It is harder to breathe than usual.  Your breathing is faster than usual.  You have trouble sleeping.  You need to use your medicines more often than usual.  You have trouble doing your normal activities such as getting dressed or walking around the house. Get help right away if:  You have shortness of breath while resting.  You have shortness of breath that stops you from: ? Being able to talk. ? Doing normal activities.  Your chest hurts  for longer than 5 minutes.  Your skin color is more blue than usual.  Your pulse oximeter shows that you have low oxygen for longer than 5 minutes.  You have a fever.  You feel too tired to breathe normally. Summary  Chronic obstructive pulmonary disease (COPD) is a long-term lung problem.  The way your lungs work will never return to normal. Usually the condition gets worse over time. There are things you can do to keep yourself as healthy as possible.  Take over-the-counter and prescription medicines only as told by your doctor.  If you smoke, stop. Smoking makes the problem worse. This information is not intended to replace advice given to you by your health care provider. Make sure you discuss any questions you have with your health care provider. Document Revised: 09/05/2017 Document Reviewed: 10/28/2016 Elsevier Patient Education  2020 [employer].

## 2020-05-11 ENCOUNTER — Telehealth: Payer: Self-pay | Admitting: Primary Care

## 2020-05-11 MED ORDER — SPIRIVA RESPIMAT 2.5 MCG/ACT IN AERS: 2.0000 | INHALATION_SPRAY | Freq: Every day | RESPIRATORY_TRACT | 5 refills | Status: DC

## 2020-05-11 NOTE — Telephone Encounter (Signed)
Ran test claim for 1 month supply:  Spiriva Respimat (plan preferred)- $406.43 copay  Incruse- $334.41 copay  Anoro- $406.43 copay  Stiolto and Bevespi- non-formulary  Patient has $359.43 unmet pharmacy deductible that is apply to all claims. Patient should try applying for patient assistance for the chosen medication.

## 2020-05-11 NOTE — Telephone Encounter (Signed)
Please let patient know Stiolto is not covered on her plan. Lets send in a prescription for Spiriva respimat 2.73mcg two puffs once daily and start process for patient assistance for this please. Can you also remove stiolto from list, she can finsih sample and then start spiriva

## 2020-05-11 NOTE — Telephone Encounter (Signed)
Patient reporting that Stiolto is $399, are there any cheaper options? Please advise.

## 2020-05-11 NOTE — Telephone Encounter (Signed)
Spoke with pt and informed her of Beth's recommendations. Placed order for Sprivia and removed Stiolto from med list. Instructed pt if co-pay was too expensive to notify office to revaluate inhaler choice.

## 2020-05-15 NOTE — Telephone Encounter (Signed)
Test claims ran in previous encounter. Per Waynetta Sandy, initiate patient assistance for Spiriva Respimat.

## 2020-05-15 NOTE — Telephone Encounter (Signed)
Spoke with the pt  She states she is unsure if she will meet criteria for patient assistance  She went to pharm over the wkend and she picked up rx for Dulera 200 and this was only $40  She states this was prescribed by Lindaann Pascal, PA  She wants to know if she can stay on this  Please advise thanks

## 2020-05-15 NOTE — Telephone Encounter (Signed)
Tried calling the pt back- there was no answer and her VM not set up yet

## 2020-05-15 NOTE — Telephone Encounter (Signed)
*  LABA/LAMA

## 2020-05-15 NOTE — Telephone Encounter (Signed)
I would prefer her to be on LAMA or LABA/LABA for her COPD but if not affordable and ICS/LABA is than she can stay on dulera. Make sure to update medication list and that she follows up with [place] in the next few months.

## 2020-05-16 NOTE — Telephone Encounter (Signed)
Patient is returning phone call. Patient phone number is [phone].

## 2020-05-16 NOTE — Telephone Encounter (Signed)
ATC pt, call went straight to VM. LMTCB x2 for pt.  

## 2020-05-16 NOTE — Telephone Encounter (Signed)
Attempted to call pt but unable to reach. Left message for her to return call. 

## 2020-05-17 NOTE — Telephone Encounter (Signed)
LMTCB x2 for pt 

## 2020-05-17 NOTE — Telephone Encounter (Signed)
Patient is returning phone call. Patient phone number is [phone]. °

## 2020-05-17 NOTE — Telephone Encounter (Signed)
Called and spoke with pt letting her know the info stated by pharmacy team: Per Waynetta Sandy, initiate patient assistance for Spiriva Respimat.  Pt verbalized understanding and stated she wanted to finish the current Rx of Dulera that she picked up from the pharmacy and she was also okay with [place] starting patient assistance for Spiriva Respimat. Pt stated she would begin new inhaler after finished with the [facility].  Pt asked to have the patient assistance paperwork mailed to her for her to complete and then she would bring it back by office once she has completed her portion. Paperwork has been initiated and placed in mail for pt. Nothing further needed.

## 2020-05-22 ENCOUNTER — Encounter: Payer: Self-pay | Admitting: Rheumatology

## 2020-05-22 ENCOUNTER — Ambulatory Visit (INDEPENDENT_AMBULATORY_CARE_PROVIDER_SITE_OTHER): Payer: Medicare Other | Admitting: Rheumatology

## 2020-05-22 ENCOUNTER — Other Ambulatory Visit: Payer: Self-pay

## 2020-05-22 VITALS — BP 120/76 | HR 83 | Resp 18 | Ht 70.0 in | Wt 192.0 lb

## 2020-05-22 DIAGNOSIS — Z96642 Presence of left artificial hip joint: Secondary | ICD-10-CM

## 2020-05-22 DIAGNOSIS — M24521 Contracture, right elbow: Secondary | ICD-10-CM

## 2020-05-22 DIAGNOSIS — Z79899 Other long term (current) drug therapy: Secondary | ICD-10-CM

## 2020-05-22 DIAGNOSIS — Z96641 Presence of right artificial hip joint: Secondary | ICD-10-CM

## 2020-05-22 DIAGNOSIS — M4807 Spinal stenosis, lumbosacral region: Secondary | ICD-10-CM | POA: Diagnosis not present

## 2020-05-22 DIAGNOSIS — E538 Deficiency of other specified B group vitamins: Secondary | ICD-10-CM

## 2020-05-22 DIAGNOSIS — M87052 Idiopathic aseptic necrosis of left femur: Secondary | ICD-10-CM

## 2020-05-22 DIAGNOSIS — F4310 Post-traumatic stress disorder, unspecified: Secondary | ICD-10-CM

## 2020-05-22 DIAGNOSIS — M45 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: Secondary | ICD-10-CM

## 2020-05-22 DIAGNOSIS — M533 Sacrococcygeal disorders, not elsewhere classified: Secondary | ICD-10-CM

## 2020-05-22 DIAGNOSIS — R918 Other nonspecific abnormal finding of lung field: Secondary | ICD-10-CM

## 2020-05-22 DIAGNOSIS — M5136 Other intervertebral disc degeneration, lumbar region: Secondary | ICD-10-CM

## 2020-05-22 DIAGNOSIS — G8929 Other chronic pain: Secondary | ICD-10-CM

## 2020-05-22 DIAGNOSIS — F319 Bipolar disorder, unspecified: Secondary | ICD-10-CM

## 2020-05-22 DIAGNOSIS — Z8709 Personal history of other diseases of the respiratory system: Secondary | ICD-10-CM

## 2020-05-22 DIAGNOSIS — M87051 Idiopathic aseptic necrosis of right femur: Secondary | ICD-10-CM

## 2020-05-22 DIAGNOSIS — F17200 Nicotine dependence, unspecified, uncomplicated: Secondary | ICD-10-CM

## 2020-05-22 DIAGNOSIS — M24522 Contracture, left elbow: Secondary | ICD-10-CM

## 2020-05-22 NOTE — Patient Instructions (Addendum)
Vaccines You are taking a medication(s) that can suppress your immune system.  The following immunizations are recommended: . Flu annually . Pneumonia (Pneumovax 23 and Prevnar 13 spaced at least 1 year apart) . Shingrix (after age 68)  Please check with your PCP to make sure you are up to date.  COVID-19 vaccine recommendations:   COVID-19 vaccine is recommended for everyone (unless you are allergic to a vaccine component), even if you are on a medication that suppresses your immune system.   If you are on Methotrexate, Cellcept (mycophenolate), Rinvoq, Judy [name], and Olumiant- hold the medication for 1 week after each vaccine. Hold Methotrexate for 2 weeks after the single dose COVID-19 vaccine.   If you are on Orencia subcutaneous injection - hold medication one week prior to and one week after the first COVID-19 vaccine dose (only).   If you are on Orencia IV infusions- time vaccination administration so that the first COVID-19 vaccination will occur four weeks after the infusion and postpone the subsequent infusion by one week.   If you are on Cyclophosphamide or Rituxan infusions please contact your doctor prior to receiving the COVID-19 vaccine.   Do not take Tylenol or ant anti-inflammatory medications (NSAIDs) 24 hours prior to the COVID-19 vaccination.   There is no direct evidence about the efficacy of the COVID-19 vaccine in individuals who are on medications that suppress the immune system.   Even if you are fully vaccinated, and you are on any medications that suppress your immune system, please continue to wear a mask, maintain at least six feet social distance and practice hand hygiene.   If you develop a COVID-19 infection, please contact your PCP or our office to determine if you need antibody infusion.  Booster is available now.  If you had Pfizer COVID-19 vaccine then you should receive Pfizer COVID-19 booster.  If you had Moderna COVID-19 vaccine then you should receive  Moderna COVID-19 booster.  [email or web address]

## 2020-05-22 NOTE — Progress Notes (Signed)
Pharmacy Note  Subjective: Patient presents today to [facility] for follow up office visit.   Patient seen by the pharmacist for counseling on Cimzia for ankylosing spondylitis. She is naive to therapy.  Objective:  CBC    Component Value Date/Time   WBC 5.6 04/26/2020 1126   RBC 4.23 04/26/2020 1126   HGB 12.6 04/26/2020 1126   HCT 38.3 04/26/2020 1126   PLT 270 04/26/2020 1126   MCV 90.5 04/26/2020 1126   MCH 29.8 04/26/2020 1126   MCHC 32.9 04/26/2020 1126   RDW 12.9 04/26/2020 1126   LYMPHSABS 1,176 04/26/2020 1126   MONOABS 0.5 12/11/2018 1023   EOSABS 101 04/26/2020 1126   BASOSABS 28 04/26/2020 1126    CMP     Component Value Date/Time   NA 141 04/26/2020 1126   K 4.5 04/26/2020 1126   CL 105 04/26/2020 1126   CO2 26 04/26/2020 1126   GLUCOSE 96 04/26/2020 1126   BUN 9 04/26/2020 1126   CREATININE 0.97 04/26/2020 1126   CALCIUM 9.9 04/26/2020 1126   PROT 6.3 04/26/2020 1126   PROT 6.3 04/26/2020 1126   ALBUMIN 3.8 12/11/2018 1023   AST 16 04/26/2020 1126   ALT 16 04/26/2020 1126   ALKPHOS 139 (H) 12/11/2018 1023   BILITOT 0.4 04/26/2020 1126   GFRNONAA 60 04/26/2020 1126   GFRAA 70 04/26/2020 1126     Baseline Immunosuppressant Therapy Labs TB GOLD Quantiferon TB Gold Latest Ref Rng & Units 04/26/2020  Quantiferon TB Gold Plus NEGATIVE NEGATIVE   Hepatitis Panel Hepatitis Latest Ref Rng & Units 04/26/2020  Hep B Surface Ag NON-REACTI NON-REACTIVE  Hep B IgM NON-REACTI NON-REACTIVE  Hep C Ab NON-REACTI -  Hep C Ab NON-REACTI -   HIV Lab Results  Component Value Date   HIV NON-REACTIVE 04/26/2020   HIV Non Reactive 08/23/2017   HIV Non Reactive 04/14/2017   Immunoglobulins Immunoglobulin Electrophoresis Latest Ref Rng & Units 04/26/2020  IgA  70 - 320 mg/dL 678  IgG 938 - 1,017 mg/dL 510  IgM 50 - 258 mg/dL 52(D)   SPEP Serum Protein Electrophoresis Latest Ref Rng & Units 04/26/2020  Total Protein 6.1 - 8.1 g/dL 6.3  Albumin  3.8 - 4.8 g/dL 7.8(E)  Alpha-1 0.2 - 0.3 g/dL 4.2(P)  Alpha-2 0.5 - 0.9 g/dL 1.0(H)  Beta Globulin 0.4 - 0.6 g/dL 0.4  Beta 2 0.2 - 0.5 g/dL 0.3  Gamma Globulin 0.8 - 1.7 g/dL 5.3(I)   R4ER No results found for: G6PDH TPMT No results found for: TPMT   Chest x-ray: no active cardiopulmonary disease 08/12/2017  Does patient have diagnosis of heart failure?  No  Assessment/Plan:  CCounseled patient that Cimzia is a TNF blocking agent.  Counseled patient on purpose, proper use, and adverse effects of Cimzia.  Reviewed the most common adverse effects including infections, headache, and injection site reactions. Discussed that there is the possibility of an increased risk of malignancy but it is not well understood if this increased risk is due to the medication or the disease state.  Advised patient to get yearly dermatology exams due to risk of skin cancer.  Counseled patient that Cimzia should be held prior to scheduled surgery.  Counseled patient to avoid live vaccines while on Cimzia.  Recommend annual influenza, Pneumovax 23, Prevnar 13, and Shingrix as indicated.   Reviewed the importance of regular labs while on Cimzia therapy. Standing orders placed. Provided patient with medication education material and answered all questions.  Patient voiced understanding.  Patient consented to Cimzia.  Will upload consent into the media tab.  Reviewed storage instructions for Cimzia.  Will apply for Cimzia through patient's insurance.  Advised initial injection must be administered in office.    Dose will be Cimzia 400 mg at weeks 0,2,4 then Cimzia 400 mg every 28 days  Prescription pending labs and/or insurance approval.   Verlin Fester, PharmD, [place], CPP Clinical Specialty Pharmacist (Rheumatology and Pulmonology)  05/22/2020 12:09 PM

## 2020-05-23 ENCOUNTER — Telehealth: Payer: Self-pay | Admitting: Pharmacist

## 2020-05-23 NOTE — Telephone Encounter (Signed)
Please start benefits investigation for in-office Cimzia for the treatment of AS.  Patient had inadequate response to NSAIDs.  She has severe disease with significant fusing.  She has Medicare and supplement.  If Cimzia not covered she is willing to consider Humira and understands that she will need to apply for patient assistance.  Verlin Fester, PharmD, [place], CPP Clinical Specialty Pharmacist (Rheumatology and Pulmonology)  05/23/2020 10:53 AM

## 2020-05-23 NOTE — Telephone Encounter (Signed)
Submitted patient's info to the [employer] portal. Will update once we receive a response.

## 2020-05-24 NOTE — Telephone Encounter (Signed)
Called patient to notify of Cimzia approval but no answer.  Left voicemail requesting a return call.    Patient was approved for Cimzia in office and will have zero dollar co-pay.  She can be scheduled for her new start visit.   Verlin Fester, PharmD, [place], CPP Clinical Specialty Pharmacist (Rheumatology and Pulmonology)  05/24/2020 9:17 AM

## 2020-05-24 NOTE — Telephone Encounter (Signed)
Patient returned call and scheduled new start visit 8/19 at 10:30AM.   Verlin Fester, PharmD, BCACP, CPP Clinical Specialty Pharmacist (Rheumatology and Pulmonology)  05/24/2020 11:37 AM

## 2020-05-24 NOTE — Telephone Encounter (Signed)
Received BIV back form [employer] for Cimzia- [id number]  Insurance: Medicare and [place]  Medicare covers 80% of the infusion and no authorization is required, and the patient's Medicare supplement would cover the 20% of the cost that was not paid for by Medicare as long as Medicare covered the medication.   Patient's supplement does not cover her Medicare [phone] deductible, but as of 05/23/20 the deductible has been met.  8:49 AM Judy [name], CPhT

## 2020-05-25 ENCOUNTER — Other Ambulatory Visit: Payer: Self-pay

## 2020-05-25 ENCOUNTER — Ambulatory Visit (INDEPENDENT_AMBULATORY_CARE_PROVIDER_SITE_OTHER): Payer: Medicare Other | Admitting: Pharmacist

## 2020-05-25 DIAGNOSIS — M45 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: Secondary | ICD-10-CM | POA: Diagnosis not present

## 2020-05-25 MED ORDER — CERTOLIZUMAB PEGOL 2 X 200 MG ~~LOC~~ KIT
400.0000 mg | PACK | Freq: Once | SUBCUTANEOUS | Status: AC
  Administered 2020-05-25: 400 mg via SUBCUTANEOUS

## 2020-05-25 NOTE — Progress Notes (Signed)
Pharmacy Note  Subjective:   Patient presents to clinic today to receive monthly dose of Cimzia.  Patient running a fever or have signs/symptoms of infection? No  Patient currently on antibiotics for the treatment of infection? No  Patient have any upcoming invasive procedures/surgeries? No  Objective: CMP     Component Value Date/Time   NA 141 04/26/2020 1126   K 4.5 04/26/2020 1126   CL 105 04/26/2020 1126   CO2 26 04/26/2020 1126   GLUCOSE 96 04/26/2020 1126   BUN 9 04/26/2020 1126   CREATININE 0.97 04/26/2020 1126   CALCIUM 9.9 04/26/2020 1126   PROT 6.3 04/26/2020 1126   PROT 6.3 04/26/2020 1126   ALBUMIN 3.8 12/11/2018 1023   AST 16 04/26/2020 1126   ALT 16 04/26/2020 1126   ALKPHOS 139 (H) 12/11/2018 1023   BILITOT 0.4 04/26/2020 1126   GFRNONAA 60 04/26/2020 1126   GFRAA 70 04/26/2020 1126    CBC    Component Value Date/Time   WBC 5.6 04/26/2020 1126   RBC 4.23 04/26/2020 1126   HGB 12.6 04/26/2020 1126   HCT 38.3 04/26/2020 1126   PLT 270 04/26/2020 1126   MCV 90.5 04/26/2020 1126   MCH 29.8 04/26/2020 1126   MCHC 32.9 04/26/2020 1126   RDW 12.9 04/26/2020 1126   LYMPHSABS 1,176 04/26/2020 1126   MONOABS 0.5 12/11/2018 1023   EOSABS 101 04/26/2020 1126   BASOSABS 28 04/26/2020 1126    Baseline Immunosuppressant Therapy Labs TB GOLD Quantiferon TB Gold Latest Ref Rng & Units 04/26/2020  Quantiferon TB Gold Plus NEGATIVE NEGATIVE   Hepatitis Panel Hepatitis Latest Ref Rng & Units 04/26/2020  Hep B Surface Ag NON-REACTI NON-REACTIVE  Hep B IgM NON-REACTI NON-REACTIVE  Hep C Ab NON-REACTI -  Hep C Ab NON-REACTI -   HIV Lab Results  Component Value Date   HIV NON-REACTIVE 04/26/2020   HIV Non Reactive 08/23/2017   HIV Non Reactive 04/14/2017   Immunoglobulins Immunoglobulin Electrophoresis Latest Ref Rng & Units 04/26/2020  IgA  70 - 320 mg/dL 120  IgG 600 - 1,540 mg/dL 824  IgM 50 - 300 mg/dL 47(L)   SPEP Serum Protein Electrophoresis  Latest Ref Rng & Units 04/26/2020  Total Protein 6.1 - 8.1 g/dL 6.3  Albumin 3.8 - 4.8 g/dL 3.5(L)  Alpha-1 0.2 - 0.3 g/dL 0.4(H)  Alpha-2 0.5 - 0.9 g/dL 1.0(H)  Beta Globulin 0.4 - 0.6 g/dL 0.4  Beta 2 0.2 - 0.5 g/dL 0.3  Gamma Globulin 0.8 - 1.7 g/dL 0.7(L)   G6PD No results found for: G6PDH TPMT No results found for: TPMT   Chest x-ray: No active cardiopulmonary disease. 08/22/2017  Assessment/Plan:   Administrations This Visit    Certolizumab Pegol KIT 400 mg    Admin Date 05/25/2020 Action Given Dose 400 mg Route Subcutaneous Administered By Deboraha Sprang, RPH-CPP          Patient tolerated injection without issue.   Patient monitored for 30 mins after injection as it was her first injection for adverse effects and none noted.    Appointment for next injection scheduled for 9/2 at 10:30.  Patient due for CBC/CMP in 1 month.  Patient is to call and reschedule appointment if running a fever with signs/symptoms of infection, on antibiotics for active infection or has an upcoming invasive procedure.  All questions encouraged and answered.  Instructed patient to call with any further questions or concerns.   Mariella Saa, PharmD, [employer], CPP Clinical Specialty Pharmacist (  Rheumatology and Pulmonology)  05/25/2020 11:43 AM

## 2020-06-02 ENCOUNTER — Telehealth: Payer: Self-pay | Admitting: Rheumatology

## 2020-06-02 NOTE — Telephone Encounter (Signed)
Returned patient's call but no answer.  Left message.  Arthralgia is a side effect of Cimzia and occurs in 6% of patients but is associated with development of lupus like syndrome.  It is unlikely that Cimzia is causing her symptoms and recommend she continue with her injection next Thursday 9/2.   Verlin Fester, PharmD, [place], CPP Clinical Specialty Pharmacist (Rheumatology and Pulmonology)  06/02/2020 1:47 PM

## 2020-06-02 NOTE — Assessment & Plan Note (Addendum)
-   Patient last seen in November 2019. Recent exacerbation treat with ZPACK and medrol dose pack prescribed by [facility] - Start trial Stiolto 2 puffs once daily in the morning - RX for Albuterol 2 puffs every 4-6 hours as needed for shortness of breath/wheezing - Continue to encourage patient quit smoking - FU in 3 months

## 2020-06-02 NOTE — Telephone Encounter (Signed)
Patient left a voicemail stating she is feeling stiff and sore all over.  Patient requested a return call from [facility].

## 2020-06-05 NOTE — Telephone Encounter (Signed)
She has been experiencing pain due to flare of her arthritis.  We should continue with Cimzia injection as planned.

## 2020-06-05 NOTE — Telephone Encounter (Signed)
Patient advised she has been experiencing pain due to flare of her arthritis.  We should continue with Cimzia injection as planned. Patient verbalized understanding.

## 2020-06-07 ENCOUNTER — Ambulatory Visit: Payer: Medicare Other

## 2020-06-08 ENCOUNTER — Ambulatory Visit: Payer: Medicare Other

## 2020-06-08 ENCOUNTER — Other Ambulatory Visit: Payer: Self-pay

## 2020-06-08 ENCOUNTER — Ambulatory Visit (INDEPENDENT_AMBULATORY_CARE_PROVIDER_SITE_OTHER): Payer: Medicare Other | Admitting: Pharmacist

## 2020-06-08 VITALS — BP 119/77 | HR 102

## 2020-06-08 DIAGNOSIS — M45 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: Secondary | ICD-10-CM

## 2020-06-08 MED ORDER — CERTOLIZUMAB PEGOL 2 X 200 MG ~~LOC~~ KIT
400.0000 mg | PACK | Freq: Once | SUBCUTANEOUS | Status: AC
  Administered 2020-06-08: 400 mg via SUBCUTANEOUS

## 2020-06-08 NOTE — Progress Notes (Signed)
Pharmacy Note  Subjective:   Patient presents to clinic today to receive second dose of Cimzia.  Patient running a fever or have signs/symptoms of infection? No  Patient currently on antibiotics for the treatment of infection? No  Patient have any upcoming invasive procedures/surgeries? No  Objective: CMP     Component Value Date/Time   NA 141 04/26/2020 1126   K 4.5 04/26/2020 1126   CL 105 04/26/2020 1126   CO2 26 04/26/2020 1126   GLUCOSE 96 04/26/2020 1126   BUN 9 04/26/2020 1126   CREATININE 0.97 04/26/2020 1126   CALCIUM 9.9 04/26/2020 1126   PROT 6.3 04/26/2020 1126   PROT 6.3 04/26/2020 1126   ALBUMIN 3.8 12/11/2018 1023   AST 16 04/26/2020 1126   ALT 16 04/26/2020 1126   ALKPHOS 139 (H) 12/11/2018 1023   BILITOT 0.4 04/26/2020 1126   GFRNONAA 60 04/26/2020 1126   GFRAA 70 04/26/2020 1126    CBC    Component Value Date/Time   WBC 5.6 04/26/2020 1126   RBC 4.23 04/26/2020 1126   HGB 12.6 04/26/2020 1126   HCT 38.3 04/26/2020 1126   PLT 270 04/26/2020 1126   MCV 90.5 04/26/2020 1126   MCH 29.8 04/26/2020 1126   MCHC 32.9 04/26/2020 1126   RDW 12.9 04/26/2020 1126   LYMPHSABS 1,176 04/26/2020 1126   MONOABS 0.5 12/11/2018 1023   EOSABS 101 04/26/2020 1126   BASOSABS 28 04/26/2020 1126    Baseline Immunosuppressant Therapy Labs TB GOLD Quantiferon TB Gold Latest Ref Rng & Units 04/26/2020  Quantiferon TB Gold Plus NEGATIVE NEGATIVE   Hepatitis Panel Hepatitis Latest Ref Rng & Units 04/26/2020  Hep B Surface Ag NON-REACTI NON-REACTIVE  Hep B IgM NON-REACTI NON-REACTIVE  Hep C Ab NON-REACTI -  Hep C Ab NON-REACTI -   HIV Lab Results  Component Value Date   HIV NON-REACTIVE 04/26/2020   HIV Non Reactive 08/23/2017   HIV Non Reactive 04/14/2017   Immunoglobulins Immunoglobulin Electrophoresis Latest Ref Rng & Units 04/26/2020  IgA  70 - 320 mg/dL 120  IgG 600 - 1,540 mg/dL 824  IgM 50 - 300 mg/dL 47(L)   SPEP Serum Protein Electrophoresis  Latest Ref Rng & Units 04/26/2020  Total Protein 6.1 - 8.1 g/dL 6.3  Albumin 3.8 - 4.8 g/dL 3.5(L)  Alpha-1 0.2 - 0.3 g/dL 0.4(H)  Alpha-2 0.5 - 0.9 g/dL 1.0(H)  Beta Globulin 0.4 - 0.6 g/dL 0.4  Beta 2 0.2 - 0.5 g/dL 0.3  Gamma Globulin 0.8 - 1.7 g/dL 0.7(L)   G6PD No results found for: G6PDH TPMT No results found for: TPMT   Chest x-ray: No active cardiopulmonary disease. 08/22/2017  Assessment/Plan:   Administrations This Visit    Certolizumab Pegol KIT 400 mg    Admin Date 06/08/2020 Action Given Dose 400 mg Route Subcutaneous Administered By Deboraha Sprang, RPH-CPP          Patient tolerated without issue.   Appointment for next injection scheduled for 06/22/20 at 10:30.  Patient due for labs at next visit.  Patient is to call and reschedule appointment if running a fever with signs/symptoms of infection, on antibiotics for active infection or has an upcoming invasive procedure.  All questions encouraged and answered.  Instructed patient to call with any further questions or concerns.   Mariella Saa, PharmD, [place], CPP Clinical Specialty Pharmacist (Rheumatology and Pulmonology)  06/08/2020 11:40 AM

## 2020-06-22 ENCOUNTER — Other Ambulatory Visit: Payer: Self-pay

## 2020-06-22 ENCOUNTER — Ambulatory Visit (INDEPENDENT_AMBULATORY_CARE_PROVIDER_SITE_OTHER): Payer: Medicare Other | Admitting: Pharmacist

## 2020-06-22 VITALS — BP 138/75 | HR 100

## 2020-06-22 DIAGNOSIS — M45 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: Secondary | ICD-10-CM | POA: Diagnosis not present

## 2020-06-22 DIAGNOSIS — Z79899 Other long term (current) drug therapy: Secondary | ICD-10-CM

## 2020-06-22 MED ORDER — CERTOLIZUMAB PEGOL 2 X 200 MG ~~LOC~~ KIT
400.0000 mg | PACK | Freq: Once | SUBCUTANEOUS | Status: AC
  Administered 2020-06-22: 400 mg via SUBCUTANEOUS

## 2020-06-22 NOTE — Progress Notes (Signed)
Office Visit Note  [name]             Date of Birth: 1951-05-30           MRN: [id number]             PCP: Wynn Banker, MD Referring: Wynn Banker, MD Visit Date: 07/04/2020 Occupation: @GUAROCC @  Subjective:  Left sided low back pain   History of Present Illness: Judy [name] is a 69 y.o. female with history of ankylosing spondylitis.  She received the last of the loading dose of cimzia on 06/22/20.  She has been tolerating these injections without any side effects or injection site reactions.  She has started to notice improvement in her neck pain and stiffness since starting on Cimzia.  She denies any midline thoracic or lumbar spine pain at this time.  She is having some discomfort in the muscles on the left side of her lower back.  She is unsure what caused the onset of discomfort.  She has not tried massage, heat, or any topical agents/pain patches.  She denies any other joint pain or joint swelling at this time.  She denies any achilles tendonitis or plantar fasciitis.   She denies any recent infections.    Activities of Daily Living:  Patient reports morning stiffness for 0 minutes.   Patient Denies nocturnal pain.  Difficulty dressing/grooming: Reports Difficulty climbing stairs: Reports Difficulty getting out of chair: Reports Difficulty using hands for taps, buttons, cutlery, and/or writing: Denies  Review of Systems  Constitutional: Positive for fatigue.  HENT: Negative for mouth sores, mouth dryness and nose dryness.   Eyes: Negative for pain, visual disturbance and dryness.  Respiratory: Positive for shortness of breath. Negative for cough, hemoptysis and difficulty breathing.   Cardiovascular: Negative for chest pain, palpitations and swelling in legs/feet.  Gastrointestinal: Negative for abdominal pain, blood in stool, constipation and diarrhea.  Endocrine: Negative for increased urination.  Genitourinary: Negative for  painful urination.  Musculoskeletal: Negative for arthralgias, joint pain, joint swelling, myalgias, muscle weakness, morning stiffness, muscle tenderness and myalgias.  Skin: Negative for color change, rash and redness.  Allergic/Immunologic: Negative for susceptible to infections.  Neurological: Negative for dizziness, numbness, headaches, memory loss and weakness.  Hematological: Negative for swollen glands.  Psychiatric/Behavioral: Positive for depressed mood and sleep disturbance. Negative for confusion. The patient is nervous/anxious.     PMFS History:  Patient Active Problem List   Diagnosis Date Noted  . 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  . Status post total replacement of left hip 03/19/2019  . Avascular necrosis of bone of hip, 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  . PTSD (post-traumatic stress disorder)   . COPD (chronic obstructive pulmonary disease) (HCC)   . Bipolar 1 disorder (HCC)   . Anxiety   . B12 deficiency 12/09/2018  . COPD with acute exacerbation (HCC) 04/14/2017  . Hyperglycemia 04/14/2017    Past Medical History:  Diagnosis Date  . Anxiety   . Arthritis   . Avascular necrosis (HCC)    Left hip  . Bipolar 1 disorder (HCC)   . COPD (chronic obstructive pulmonary disease) (HCC)   . Pneumonia   . PTSD (post-traumatic stress disorder)     Family History  Problem Relation Age of Onset  . CAD Mother 46  . COPD Mother   . Depression Mother   . Heart attack Father   .  Colon polyps Sister   . Heart attack Maternal Grandmother   . Early death Maternal Grandfather   . Hearing loss Paternal Grandfather   . Bipolar disorder Son   . Asthma Daughter    Past Surgical History:  Procedure Laterality Date  . bone spurs foot Right   . COLONOSCOPY    . HAND SURGERY Left   . JOINT REPLACEMENT    . TONSILLECTOMY    . TONSILLECTOMY AND ADENOIDECTOMY    . TOTAL HIP ARTHROPLASTY Left 03/19/2019    Procedure: LEFT TOTAL HIP ARTHROPLASTY ANTERIOR APPROACH;  Surgeon: Kathryne HitchBlackman, Christopher Y, MD;  Location: [facility];  Service: Orthopedics;  Laterality: Left;  . TOTAL HIP ARTHROPLASTY Right 06/04/2019   Procedure: RIGHT TOTAL HIP ARTHROPLASTY ANTERIOR APPROACH;  Surgeon: Kathryne HitchBlackman, Christopher Y, MD;  Location: [facility];  Service: Orthopedics;  Laterality: Right;   Social History   Social History Narrative  . Not on file   Immunization History  Administered Date(s) Administered  . Influenza, High Dose Seasonal PF 08/25/2018  . Moderna SARS-COVID-2 Vaccination 11/08/2019, 12/05/2019  . Pneumococcal Conjugate-13 09/23/2018  . Pneumococcal Polysaccharide-23 08/27/2016  . Tdap 10/07/2012  . Yellow Fever 07/05/2008     Objective: Vital Signs: BP 96/62 (BP Location: Left Arm, Patient Position: Sitting, Cuff Size: Small)   Pulse 76   Ht 5\' 10"  (1.778 m)   Wt 190 lb 3.2 oz (86.3 kg)   BMI 27.29 kg/m    Physical Exam Vitals and nursing note reviewed.  Constitutional:      Appearance: She is well-developed.  HENT:     Head: Normocephalic and atraumatic.  Eyes:     Conjunctiva/sclera: Conjunctivae normal.  Pulmonary:     Effort: Pulmonary effort is normal.  Abdominal:     Palpations: Abdomen is soft.  Musculoskeletal:     Cervical back: Normal range of motion.  Skin:    General: Skin is warm and dry.     Capillary Refill: Capillary refill takes less than 2 seconds.  Neurological:     Mental Status: She is alert and oriented to person, place, and time.  Psychiatric:        Behavior: Behavior normal.      Musculoskeletal Exam: Limited ROM of C-spine, thoracic spine, and lumbar spine.  No midline spinal tenderness.  No SI joint tenderness. Shoulder joint abduction slightly limited bilaterally.  Mild flexion contractures of both elbows.  Wrist joints, MCPs, PIPs, and DIPs good ROM with no synovitis.  PIP and DIP thickening consistent with osteoarthritis of both hands.  Knee joints  good ROM with no warmth or effusion.  Bilateral knee crepitus noted.  Ankle joints good ROM with no tenderness or inflammation.  No achilles tendonitis or plantar fasciitis.   CDAI Exam: CDAI Score: -- Patient Global: --; Provider Global: -- Swollen: --; Tender: -- Joint Exam 07/04/2020   No joint exam has been documented for this visit   There is currently no information documented on the homunculus. Go to the Rheumatology activity and complete the homunculus joint exam.  Investigation: No additional findings.  Imaging: No results found.  Recent Labs: Lab Results  Component Value Date   WBC 4.8 06/22/2020   HGB 13.8 06/22/2020   PLT 233 06/22/2020   NA 140 06/22/2020   K 4.0 06/22/2020   CL 104 06/22/2020   CO2 25 06/22/2020   GLUCOSE 159 (H) 06/22/2020   BUN 9 06/22/2020   CREATININE 0.99 06/22/2020   BILITOT 0.4 06/22/2020   ALKPHOS 139 (  H) 12/11/2018   AST 15 06/22/2020   ALT 18 06/22/2020   PROT 6.3 06/22/2020   ALBUMIN 3.8 12/11/2018   CALCIUM 9.9 06/22/2020   GFRAA 68 06/22/2020   QFTBGOLDPLUS NEGATIVE 04/26/2020    Speciality Comments: No specialty comments available.  Procedures:  No procedures performed Allergies: Celexa [citalopram hydrobromide] and Gabapentin   Assessment / Plan:     Visit Diagnoses: 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 [facility]): C-spine, thoracic spine, and lumbar spine fused: She has started to notice clinical improvement since starting on an office administered Cimzia.  She completed the loading dose on 06/22/2020.  She has noticed less discomfort and stiffness in her neck recently.  She has no midline spinal tenderness or SI joint tenderness on exam today.  She has limited range of motion of the C-spine, thoracic spine, and lumbar spine.  She will continue on Cimzia 400 mg subcutaneous injections every 4 weeks as her maintenance dose.  Her next injection is scheduled on 07/20/2020.  She will follow-up in the office in 2 months  to reassess her improvement.  High risk medication use - Cimzia 400 mg sq injections every 4 weeks-Administered in the office.  She completed the loading dose of Cimzia on 06/22/20.  She has been tolerating Cimzia without any side effects or injection site reactions.- Plan: CBC with Differential/Platelet, COMPLETE METABOLIC PANEL WITH GFR She had all baseline immunosuppressive labs on 04/26/2020.  CBC and CMP were updated 1 month after starting on Cimzia.  Results of CBC and CMP were reviewed with the patient today in the office.  Her next lab work will be due in December and every 3 months to monitor for drug toxicity.  TB gold was negative on 04/26/2020 and will continue to be monitored yearly. She has not had any recent infections.  We discussed the importance of postponing Cimzia injections if she develops signs or symptoms of an infection.  She has received both COVID-19 vaccinations and is planning on receiving the third dose.  She was advised to avoid taking Tylenol or NSAIDs 24 hours prior to receiving the third dose.  She was advised to notify us or her PCP if she develops a COVID-19 infection in order to receive the antibody infusion.  She voiced understanding.  DDD (degenerative disc disease), lumbar: Chronic pain and stiffness.  Limited ROM.  No midline spinal tenderness.  She has no symptoms of radiculopathy.  She has been experiencing left sided paraspinal muscle tenderness and discomfort.  We discussed the use of a heating pad, massage, and topical agents/patches.  She was provided a sample of a salonpas patch today in the office.   Spinal stenosis of lumbosacral region  Chronic SI joint pain: She is not experiencing any SI joint discomfort or tenderness.   Status post total replacement of left hip: Doing well.  She is not having any discomfort at this time.   Avascular necrosis of bone of right hip [facility]): h/o  Avascular necrosis of bone of hip, left [facility]): h/o  Status post total  replacement of right hip: Doing well.  She is not having any discomfort at this time.   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  No tenderness or inflammation on exam.    Other medical conditions are listed as follows:   Pulmonary nodules - Followed by with Dr. Isaiah Serge  Bipolar 1 disorder [facility])  Smoker  History of COPD  PTSD (post-traumatic stress disorder)  B12 deficiency  Screening cholesterol level -  Plan: Lipid panel  Orders: Orders Placed This Encounter  Procedures  . CBC with Differential/Platelet  . COMPLETE METABOLIC PANEL WITH GFR  . Lipid panel   No orders of the defined types were placed in this encounter.    Follow-Up Instructions: Return in about 2 months (around 09/03/2020) for Ankylosing Spondylitis.   Gearldine Bienenstock, PA-C  Note - This record has been created using Dragon software.  Chart creation errors have been sought, but may not always  have been located. Such creation errors do not reflect on  the standard of medical care.

## 2020-06-22 NOTE — Progress Notes (Signed)
Pharmacy Note  Subjective:   Patient presents to clinic today to receive last of loading dose of Cimzia.  Patient reports some improvement in neck discomfort and range of motion.  Patient running a fever or have signs/symptoms of infection? No  Patient currently on antibiotics for the treatment of infection? No  Patient have any upcoming invasive procedures/surgeries? No  Objective: CMP     Component Value Date/Time   NA 141 04/26/2020 1126   K 4.5 04/26/2020 1126   CL 105 04/26/2020 1126   CO2 26 04/26/2020 1126   GLUCOSE 96 04/26/2020 1126   BUN 9 04/26/2020 1126   CREATININE 0.97 04/26/2020 1126   CALCIUM 9.9 04/26/2020 1126   PROT 6.3 04/26/2020 1126   PROT 6.3 04/26/2020 1126   ALBUMIN 3.8 12/11/2018 1023   AST 16 04/26/2020 1126   ALT 16 04/26/2020 1126   ALKPHOS 139 (H) 12/11/2018 1023   BILITOT 0.4 04/26/2020 1126   GFRNONAA 60 04/26/2020 1126   GFRAA 70 04/26/2020 1126    CBC    Component Value Date/Time   WBC 5.6 04/26/2020 1126   RBC 4.23 04/26/2020 1126   HGB 12.6 04/26/2020 1126   HCT 38.3 04/26/2020 1126   PLT 270 04/26/2020 1126   MCV 90.5 04/26/2020 1126   MCH 29.8 04/26/2020 1126   MCHC 32.9 04/26/2020 1126   RDW 12.9 04/26/2020 1126   LYMPHSABS 1,176 04/26/2020 1126   MONOABS 0.5 12/11/2018 1023   EOSABS 101 04/26/2020 1126   BASOSABS 28 04/26/2020 1126    Baseline Immunosuppressant Therapy Labs TB GOLD Quantiferon TB Gold Latest Ref Rng & Units 04/26/2020  Quantiferon TB Gold Plus NEGATIVE NEGATIVE   Hepatitis Panel Hepatitis Latest Ref Rng & Units 04/26/2020  Hep B Surface Ag NON-REACTI NON-REACTIVE  Hep B IgM NON-REACTI NON-REACTIVE  Hep C Ab NON-REACTI -  Hep C Ab NON-REACTI -   HIV Lab Results  Component Value Date   HIV NON-REACTIVE 04/26/2020   HIV Non Reactive 08/23/2017   HIV Non Reactive 04/14/2017   Immunoglobulins Immunoglobulin Electrophoresis Latest Ref Rng & Units 04/26/2020  IgA  70 - 320 mg/dL 120  IgG 600 -  1,540 mg/dL 824  IgM 50 - 300 mg/dL 47(L)   SPEP Serum Protein Electrophoresis Latest Ref Rng & Units 04/26/2020  Total Protein 6.1 - 8.1 g/dL 6.3  Albumin 3.8 - 4.8 g/dL 3.5(L)  Alpha-1 0.2 - 0.3 g/dL 0.4(H)  Alpha-2 0.5 - 0.9 g/dL 1.0(H)  Beta Globulin 0.4 - 0.6 g/dL 0.4  Beta 2 0.2 - 0.5 g/dL 0.3  Gamma Globulin 0.8 - 1.7 g/dL 0.7(L)   G6PD No results found for: G6PDH TPMT No results found for: TPMT   Chest x-ray: No active cardiopulmonary disease.08/22/2017  Assessment/Plan:   Administrations This Visit    Certolizumab Pegol KIT 400 mg    Admin Date 06/22/2020 Action Given Dose 400 mg Route Subcutaneous Administered By Deboraha Sprang, RPH-CPP         Patient tolerated injection without issue.   Patient has completed loading dose and has a follow up visit on 9/28 to assess response. Appointment for next injection scheduled for 07/20/20 @ 2PM..  CBC/CMP drawn today and will be due again in December. TB gold due July 2022.  Patient is to call and reschedule appointment if running a fever with signs/symptoms of infection, on antibiotics for active infection or has an upcoming invasive procedure.  All questions encouraged and answered.  Instructed patient to call with any  further questions or concerns.  Mariella Saa, PharmD, [place], CPP Clinical Specialty Pharmacist (Rheumatology and Pulmonology)  06/22/2020 11:21 AM

## 2020-06-23 LAB — CBC WITH DIFFERENTIAL/PLATELET
Absolute Monocytes: 264 cells/uL (ref 200–950)
Basophils Absolute: 10 cells/uL (ref 0–200)
Basophils Relative: 0.2 %
Eosinophils Absolute: 91 cells/uL (ref 15–500)
Eosinophils Relative: 1.9 %
HCT: 41.5 % (ref 35.0–45.0)
Hemoglobin: 13.8 g/dL (ref 11.7–15.5)
Lymphs Abs: 2011 cells/uL (ref 850–3900)
MCH: 30.7 pg (ref 27.0–33.0)
MCHC: 33.3 g/dL (ref 32.0–36.0)
MCV: 92.2 fL (ref 80.0–100.0)
MPV: 9.7 fL (ref 7.5–12.5)
Monocytes Relative: 5.5 %
Neutro Abs: 2424 cells/uL (ref 1500–7800)
Neutrophils Relative %: 50.5 %
Platelets: 233 10*3/uL (ref 140–400)
RBC: 4.5 10*6/uL (ref 3.80–5.10)
RDW: 13.9 % (ref 11.0–15.0)
Total Lymphocyte: 41.9 %
WBC: 4.8 10*3/uL (ref 3.8–10.8)

## 2020-06-23 LAB — COMPLETE METABOLIC PANEL WITH GFR
AG Ratio: 1.9 (calc) (ref 1.0–2.5)
ALT: 18 U/L (ref 6–29)
AST: 15 U/L (ref 10–35)
Albumin: 4.1 g/dL (ref 3.6–5.1)
Alkaline phosphatase (APISO): 130 U/L (ref 37–153)
BUN: 9 mg/dL (ref 7–25)
CO2: 25 mmol/L (ref 20–32)
Calcium: 9.9 mg/dL (ref 8.6–10.4)
Chloride: 104 mmol/L (ref 98–110)
Creat: 0.99 mg/dL (ref 0.50–0.99)
GFR, Est African American: 68 mL/min/{1.73_m2} (ref >=60)
GFR, Est Non African American: 59 mL/min/{1.73_m2} — ABNORMAL LOW (ref >=60)
Globulin: 2.2 g/dL (calc) (ref 1.9–3.7)
Glucose, Bld: 159 mg/dL — ABNORMAL HIGH (ref 65–99)
Potassium: 4 mmol/L (ref 3.5–5.3)
Sodium: 140 mmol/L (ref 135–146)
Total Bilirubin: 0.4 mg/dL (ref 0.2–1.2)
Total Protein: 6.3 g/dL (ref 6.1–8.1)

## 2020-06-23 NOTE — Progress Notes (Signed)
CBC/CMP within normal limits.  Continue Cimzia.

## 2020-07-04 ENCOUNTER — Other Ambulatory Visit: Payer: Self-pay

## 2020-07-04 ENCOUNTER — Encounter: Payer: Self-pay | Admitting: Physician Assistant

## 2020-07-04 ENCOUNTER — Ambulatory Visit (INDEPENDENT_AMBULATORY_CARE_PROVIDER_SITE_OTHER): Payer: Medicare Other | Admitting: Physician Assistant

## 2020-07-04 VITALS — BP 96/62 | HR 76 | Ht 70.0 in | Wt 190.2 lb

## 2020-07-04 DIAGNOSIS — M4807 Spinal stenosis, lumbosacral region: Secondary | ICD-10-CM

## 2020-07-04 DIAGNOSIS — M533 Sacrococcygeal disorders, not elsewhere classified: Secondary | ICD-10-CM

## 2020-07-04 DIAGNOSIS — G8929 Other chronic pain: Secondary | ICD-10-CM

## 2020-07-04 DIAGNOSIS — E538 Deficiency of other specified B group vitamins: Secondary | ICD-10-CM

## 2020-07-04 DIAGNOSIS — M87052 Idiopathic aseptic necrosis of left femur: Secondary | ICD-10-CM

## 2020-07-04 DIAGNOSIS — F319 Bipolar disorder, unspecified: Secondary | ICD-10-CM

## 2020-07-04 DIAGNOSIS — M45 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: Secondary | ICD-10-CM | POA: Diagnosis not present

## 2020-07-04 DIAGNOSIS — Z13220 Encounter for screening for lipoid disorders: Secondary | ICD-10-CM

## 2020-07-04 DIAGNOSIS — Z96642 Presence of left artificial hip joint: Secondary | ICD-10-CM

## 2020-07-04 DIAGNOSIS — F17200 Nicotine dependence, unspecified, uncomplicated: Secondary | ICD-10-CM

## 2020-07-04 DIAGNOSIS — M87051 Idiopathic aseptic necrosis of right femur: Secondary | ICD-10-CM

## 2020-07-04 DIAGNOSIS — M5136 Other intervertebral disc degeneration, lumbar region: Secondary | ICD-10-CM

## 2020-07-04 DIAGNOSIS — F4310 Post-traumatic stress disorder, unspecified: Secondary | ICD-10-CM

## 2020-07-04 DIAGNOSIS — Z79899 Other long term (current) drug therapy: Secondary | ICD-10-CM

## 2020-07-04 DIAGNOSIS — M24522 Contracture, left elbow: Secondary | ICD-10-CM

## 2020-07-04 DIAGNOSIS — Z8709 Personal history of other diseases of the respiratory system: Secondary | ICD-10-CM

## 2020-07-04 DIAGNOSIS — M24521 Contracture, right elbow: Secondary | ICD-10-CM

## 2020-07-04 DIAGNOSIS — R918 Other nonspecific abnormal finding of lung field: Secondary | ICD-10-CM

## 2020-07-04 DIAGNOSIS — Z96641 Presence of right artificial hip joint: Secondary | ICD-10-CM

## 2020-07-04 NOTE — Patient Instructions (Addendum)
Standing Labs We placed an order today for your standing lab work.   Please have your standing labs drawn in December and every 3 months   If possible, please have your labs drawn 2 weeks prior to your appointment so that the provider can discuss your results at your appointment.  We have open lab daily Monday through Thursday from 8:30-12:30 PM and 1:30-4:30 PM and Friday from 8:30-12:30 PM and 1:30-4:00 PM at the office of Dr. Shaili Deveshwar, [facility].   Please be advised, patients with office appointments requiring lab work will take precedents over walk-in lab work.  If possible, please come for your lab work on Monday and Friday afternoons, as you may experience shorter wait times. The office is located at [address], [address], , [place] [address] No appointment is necessary.   Labs are drawn by [facility]. Please bring your co-pay at the time of your lab draw.  You may receive a bill from [facility] for your lab work.  If you wish to have your labs drawn at another location, please call the office 24 hours in advance to send orders.  If you have any questions regarding directions or hours of operation,  please call [phone].   As a reminder, please drink plenty of water prior to coming for your lab work. Thanks!   COVID-19 vaccine recommendations:   COVID-19 vaccine is recommended for everyone (unless you are allergic to a vaccine component), even if you are on a medication that suppresses your immune system.   If you are on Methotrexate, Cellcept (mycophenolate), Rinvoq, Xeljanz, and Olumiant- hold the medication for 1 week after each vaccine. Hold Methotrexate for 2 weeks after the single dose COVID-19 vaccine.   If you are on Orencia subcutaneous injection - hold medication one week prior to and one week after the first COVID-19 vaccine dose (only).   If you are on Orencia IV infusions- time vaccination administration so that the first COVID-19  vaccination will occur four weeks after the infusion and postpone the subsequent infusion by one week.   If you are on Cyclophosphamide or Rituxan infusions please contact your doctor prior to receiving the COVID-19 vaccine.   Do not take Tylenol or any anti-inflammatory medications (NSAIDs) 24 hours prior to the COVID-19 vaccination.   There is no direct evidence about the efficacy of the COVID-19 vaccine in individuals who are on medications that suppress the immune system.   Even if you are fully vaccinated, and you are on any medications that suppress your immune system, please continue to wear a mask, maintain at least six feet social distance and practice hand hygiene.   If you develop a COVID-19 infection, please contact your PCP or our office to determine if you need antibody infusion.  The booster vaccine is now available for immunocompromised patients. It is advised that if you had Pfizer vaccine you should get Pfizer booster.  If you had a Moderna vaccine then you should get a Moderna booster. Johnson and Johnson does not have a booster vaccine at this time.  Please see the following web sites for updated information.   https://[email or web address]/[email or web address]/Files/COVID-19-Vaccination-Patient-Resources.[email or web address]  https://[email or web address].org/About-Us/Newsroom/Press-Releases/ID/1159    

## 2020-07-17 ENCOUNTER — Other Ambulatory Visit: Payer: Self-pay

## 2020-07-17 ENCOUNTER — Ambulatory Visit: Payer: Self-pay

## 2020-07-17 ENCOUNTER — Ambulatory Visit (INDEPENDENT_AMBULATORY_CARE_PROVIDER_SITE_OTHER): Payer: Medicare Other | Admitting: Orthopaedic Surgery

## 2020-07-17 ENCOUNTER — Encounter: Payer: Self-pay | Admitting: Orthopaedic Surgery

## 2020-07-17 DIAGNOSIS — M545 Low back pain, unspecified: Secondary | ICD-10-CM

## 2020-07-17 DIAGNOSIS — Z96642 Presence of left artificial hip joint: Secondary | ICD-10-CM | POA: Diagnosis not present

## 2020-07-17 DIAGNOSIS — M4807 Spinal stenosis, lumbosacral region: Secondary | ICD-10-CM

## 2020-07-17 DIAGNOSIS — Z96641 Presence of right artificial hip joint: Secondary | ICD-10-CM

## 2020-07-17 DIAGNOSIS — G8929 Other chronic pain: Secondary | ICD-10-CM

## 2020-07-17 MED ORDER — METHYLPREDNISOLONE 4 MG PO TABS: ORAL_TABLET | ORAL | 0 refills | Status: DC

## 2020-07-17 NOTE — Progress Notes (Signed)
Office Visit Note   [name]           Date of Birth: 1951-03-27           MRN: [id number] Visit Date: 07/17/2020              Requested by: Wynn Banker, MD [address] [place],  [place] [address] PCP: Wynn Banker, MD   Assessment & Plan: Visit Diagnoses:  1. Status post total replacement of right hip   2. Status post total replacement of left hip   3. Acute left-sided low back pain without sciatica     Plan: Given her continued and acute low back pain, we will send her to physical therapy for any modalities that can help decrease her low back pain and improve her function.  We will see her back in 6 weeks for follow-up after having some outpatient physical therapy on her lumbar spine.  I will at least try a 6-day steroid taper as well.  Follow-Up Instructions: Return in about 6 weeks (around 08/28/2020).   Orders:  Orders Placed This Encounter  Procedures  . XR Pelvis 1-2 Views   Meds ordered this encounter  Medications  . methylPREDNISolone (MEDROL) 4 MG tablet    Sig: Medrol dose pack. Take as instructed    Dispense:  21 tablet    Refill:  0      Procedures: No procedures performed   Clinical Data: No additional findings.   Subjective: Chief Complaint  Patient presents with  . Left Hip - Follow-up  . Right Hip - Follow-up  The patient is well-known to me.  She has a history of ankylosing spondylitis.  We actually replaced her left hip in June 2020 and her right hip in August 2020.  She is now seeing a rheumatologist for ankylosing spondylitis.  She says the hips are doing well but she has been having low back pain to the left side but no radicular complaints.  This is been getting worse for about 2 weeks now.  She denies any groin pain or any radiating pain down her legs.  HPI  Review of Systems She currently denies any fever, chills, nausea, vomiting.  She denies any chest pain.  Objective: Vital Signs: There  were no vitals taken for this visit.  Physical Exam She is alert and orient x3 and in no acute Ortho Exam Stress examination of both her hips show the move smoothly and fluidly.  She does have paraspinal pain to the left side of her lower back and pain to palpation of the muscles around the lower back to the left side. Specialty Comments:  No specialty comments available.  Imaging: XR Pelvis 1-2 Views  Result Date: 07/17/2020 An AP pelvis shows bilateral total hip arthroplasties with no complicating features.    PMFS History: Patient Active Problem List   Diagnosis Date Noted  . 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  . Status post total replacement of left hip 03/19/2019  . Avascular necrosis of bone of hip, 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  . PTSD (post-traumatic stress disorder)   . COPD (chronic obstructive pulmonary disease) (HCC)   . Bipolar 1 disorder (HCC)   . Anxiety   . B12 deficiency 12/09/2018  . COPD with acute exacerbation (HCC) 04/14/2017  . Hyperglycemia 04/14/2017   Past Medical History:  Diagnosis Date  . Anxiety   . Arthritis   .  Avascular necrosis (HCC)    Left hip  . Bipolar 1 disorder (HCC)   . COPD (chronic obstructive pulmonary disease) (HCC)   . Pneumonia   . PTSD (post-traumatic stress disorder)     Family History  Problem Relation Age of Onset  . CAD Mother 59  . COPD Mother   . Depression Mother   . Heart attack Father   . Colon polyps Sister   . Heart attack Maternal Grandmother   . Early death Maternal Grandfather   . Hearing loss Paternal Grandfather   . Bipolar disorder Son   . Asthma Daughter     Past Surgical History:  Procedure Laterality Date  . bone spurs foot Right   . COLONOSCOPY    . HAND SURGERY Left   . JOINT REPLACEMENT    . TONSILLECTOMY    . TONSILLECTOMY AND ADENOIDECTOMY    . TOTAL HIP ARTHROPLASTY Left 03/19/2019   Procedure: LEFT TOTAL HIP  ARTHROPLASTY ANTERIOR APPROACH;  Surgeon: [name], MD;  Location: [facility];  Service: Orthopedics;  Laterality: Left;  . TOTAL HIP ARTHROPLASTY Right 06/04/2019   Procedure: RIGHT TOTAL HIP ARTHROPLASTY ANTERIOR APPROACH;  Surgeon: [name], MD;  Location: [facility];  Service: Orthopedics;  Laterality: Right;   Social History   Occupational History  . Occupation: retired  Tobacco Use  . Smoking status: Former Smoker    Years: 43.00    Types: Cigarettes    Quit date: 06/18/2020    Years since quitting: 0.0  . Smokeless tobacco: Never Used  Vaping Use  . Vaping Use: Never used  Substance and Sexual Activity  . Alcohol use: Yes    Comment: occ  . Drug use: No  . Sexual activity: Not on file

## 2020-07-20 ENCOUNTER — Other Ambulatory Visit: Payer: Self-pay

## 2020-07-20 ENCOUNTER — Ambulatory Visit (INDEPENDENT_AMBULATORY_CARE_PROVIDER_SITE_OTHER): Payer: Medicare Other | Admitting: *Deleted

## 2020-07-20 VITALS — BP 114/71 | HR 72

## 2020-07-20 DIAGNOSIS — M45 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: Secondary | ICD-10-CM

## 2020-07-20 MED ORDER — CERTOLIZUMAB PEGOL 2 X 200 MG ~~LOC~~ KIT
400.0000 mg | PACK | Freq: Once | SUBCUTANEOUS | Status: AC
  Administered 2020-07-20: 400 mg via SUBCUTANEOUS

## 2020-07-20 NOTE — Patient Instructions (Signed)
Standing Labs We placed an order today for your standing lab work.   Please have your standing labs drawn in December  If possible, please have your labs drawn 2 weeks prior to your appointment so that the provider can discuss your results at your appointment.  We have open lab daily Monday through Thursday from 8:30-12:30 PM and 1:30-4:30 PM and Friday from 8:30-12:30 PM and 1:30-4:00 PM at the office of Dr. Shaili Deveshwar, [facility].   Please be advised, patients with office appointments requiring lab work will take precedents over walk-in lab work.  If possible, please come for your lab work on Monday and Friday afternoons, as you may experience shorter wait times. The office is located at [address], [address], [place], [place] [address] No appointment is necessary.   Labs are drawn by [facility]. Please bring your co-pay at the time of your lab draw.  You may receive a bill from [facility] for your lab work.  If you wish to have your labs drawn at another location, please call the office 24 hours in advance to send orders.  If you have any questions regarding directions or hours of operation,  please call [phone].   As a reminder, please drink plenty of water prior to coming for your lab work. Thanks!  

## 2020-07-20 NOTE — Progress Notes (Signed)
Pharmacy Note  Subjective:   Patient presents to clinic today to receive monthly dose of Cimzia.  Patient running a fever or have signs/symptoms of infection? No  Patient currently on antibiotics for the treatment of infection? No  Patient have any upcoming invasive procedures/surgeries? No  Objective: CMP     Component Value Date/Time   NA 140 06/22/2020 1043   K 4.0 06/22/2020 1043   CL 104 06/22/2020 1043   CO2 25 06/22/2020 1043   GLUCOSE 159 (H) 06/22/2020 1043   BUN 9 06/22/2020 1043   CREATININE 0.99 06/22/2020 1043   CALCIUM 9.9 06/22/2020 1043   PROT 6.3 06/22/2020 1043   ALBUMIN 3.8 12/11/2018 1023   AST 15 06/22/2020 1043   ALT 18 06/22/2020 1043   ALKPHOS 139 (H) 12/11/2018 1023   BILITOT 0.4 06/22/2020 1043   GFRNONAA 59 (L) 06/22/2020 1043   GFRAA 68 06/22/2020 1043    CBC    Component Value Date/Time   WBC 4.8 06/22/2020 1043   RBC 4.50 06/22/2020 1043   HGB 13.8 06/22/2020 1043   HCT 41.5 06/22/2020 1043   PLT 233 06/22/2020 1043   MCV 92.2 06/22/2020 1043   MCH 30.7 06/22/2020 1043   MCHC 33.3 06/22/2020 1043   RDW 13.9 06/22/2020 1043   LYMPHSABS 2,011 06/22/2020 1043   MONOABS 0.5 12/11/2018 1023   EOSABS 91 06/22/2020 1043   BASOSABS 10 06/22/2020 1043    Baseline Immunosuppressant Therapy Labs TB GOLD Quantiferon TB Gold Latest Ref Rng & Units 04/26/2020  Quantiferon TB Gold Plus NEGATIVE NEGATIVE   Hepatitis Panel Hepatitis Latest Ref Rng & Units 04/26/2020  Hep B Surface Ag NON-REACTI NON-REACTIVE  Hep B IgM NON-REACTI NON-REACTIVE  Hep C Ab NON-REACTI -  Hep C Ab NON-REACTI -   HIV Lab Results  Component Value Date   HIV NON-REACTIVE 04/26/2020   HIV Non Reactive 08/23/2017   HIV Non Reactive 04/14/2017   Immunoglobulins Immunoglobulin Electrophoresis Latest Ref Rng & Units 04/26/2020  IgA  70 - 320 mg/dL 120  IgG 600 - 1,540 mg/dL 824  IgM 50 - 300 mg/dL 47(L)   SPEP Serum Protein Electrophoresis Latest Ref Rng & Units  06/22/2020  Total Protein 6.1 - 8.1 g/dL 6.3  Albumin 3.8 - 4.8 g/dL -  Alpha-1 0.2 - 0.3 g/dL -  Alpha-2 0.5 - 0.9 g/dL -  Beta Globulin 0.4 - 0.6 g/dL -  Beta 2 0.2 - 0.5 g/dL -  Gamma Globulin 0.8 - 1.7 g/dL -   G6PD No results found for: G6PDH TPMT No results found for: TPMT   Chest x-ray: 08/22/2017  Assessment/Plan:   Patient tolerated injection well.   Administrations This Visit    Certolizumab Pegol KIT 400 mg    Admin Date 07/20/2020 Action Given Dose 400 mg Route Subcutaneous Administered By Carole Binning, LPN          Appointment for next injection scheduled for 08/17/2020.  Patient due for labs in December 2021.  Patient is to call and reschedule appointment if running a fever with signs/symptoms of infection, on antibiotics for active infection or has an upcoming invasive procedure.  All questions encouraged and answered.  Instructed patient to call with any further questions or concerns.

## 2020-07-21 ENCOUNTER — Other Ambulatory Visit: Payer: Self-pay

## 2020-07-21 DIAGNOSIS — Z13220 Encounter for screening for lipoid disorders: Secondary | ICD-10-CM

## 2020-07-21 DIAGNOSIS — Z79899 Other long term (current) drug therapy: Secondary | ICD-10-CM

## 2020-07-22 LAB — LIPID PANEL
Cholesterol: 333 mg/dL — ABNORMAL HIGH (ref <200)
HDL: 57 mg/dL (ref >=50)
LDL Cholesterol (Calc): 244 mg/dL (calc) — ABNORMAL HIGH
Non-HDL Cholesterol (Calc): 276 mg/dL (calc) — ABNORMAL HIGH (ref <130)
Total CHOL/HDL Ratio: 5.8 (calc) — ABNORMAL HIGH (ref <5.0)
Triglycerides: 156 mg/dL — ABNORMAL HIGH (ref <150)

## 2020-07-24 NOTE — Progress Notes (Signed)
Total cholesterol, triglycerides, and LDL are elevated. Please notify the patient and forward lab work to PCP.

## 2020-07-25 ENCOUNTER — Telehealth: Payer: Self-pay | Admitting: Family Medicine

## 2020-07-25 DIAGNOSIS — E785 Hyperlipidemia, unspecified: Secondary | ICD-10-CM

## 2020-07-25 NOTE — Telephone Encounter (Signed)
I was copied on labwork from [facility] and [name]'s cholesterol is extremely high. Looks like she was on lipitor 40mg  in the past. Not sure why this fell off/stopped, but if she is agreeable, please send in 90 day supply of this and ordered repeat lipid panel in 3 months time. Let me know if muscle pain with medication or if she had difficulty with tolerating in past? Please get cmp as well and dx of hyperlipidemia can be used.

## 2020-07-25 NOTE — Telephone Encounter (Signed)
-----   Message from Audrie Lia, [place] sent at 07/24/2020  3:09 PM EDT ----- Please review mutual patient's lab results. Thank you.

## 2020-07-26 NOTE — Telephone Encounter (Signed)
Left a message for the pt to return my call.  

## 2020-07-27 ENCOUNTER — Other Ambulatory Visit: Payer: Self-pay

## 2020-07-27 ENCOUNTER — Encounter: Payer: Self-pay | Admitting: Rehabilitative and Restorative Service Providers"

## 2020-07-27 ENCOUNTER — Ambulatory Visit (INDEPENDENT_AMBULATORY_CARE_PROVIDER_SITE_OTHER): Payer: Medicare Other | Admitting: Rehabilitative and Restorative Service Providers"

## 2020-07-27 DIAGNOSIS — M6281 Muscle weakness (generalized): Secondary | ICD-10-CM | POA: Diagnosis not present

## 2020-07-27 DIAGNOSIS — G8929 Other chronic pain: Secondary | ICD-10-CM

## 2020-07-27 DIAGNOSIS — R262 Difficulty in walking, not elsewhere classified: Secondary | ICD-10-CM

## 2020-07-27 DIAGNOSIS — R293 Abnormal posture: Secondary | ICD-10-CM

## 2020-07-27 DIAGNOSIS — M5441 Lumbago with sciatica, right side: Secondary | ICD-10-CM

## 2020-07-27 NOTE — Patient Instructions (Signed)
Access Code: [email or web address] URL: [email or web address] Date: 07/27/2020 Prepared by: Pauletta Browns  Exercises Standing Lumbar Extension at Wall - Forearms - 5 x daily - 7 x weekly - 1 sets - 5 reps - 3 seconds hold Standing Scapular Retraction - 5 x daily - 7 x weekly - 1 sets - 5 reps - 5 second hold Heel Toe Raises with Counter Support - 2-3 x daily - 7 x weekly - 1 sets - 10 reps - 3 seconds hold Sit to Stand with Armchair - 2-3 x daily - 7 x weekly - 1 sets - 5 reps

## 2020-07-27 NOTE — Therapy (Signed)
[facility] [address] [place], [place], [address] Phone: [phone]   Fax:  [phone]  Physical Therapy Evaluation  Patient Details  Name: Judy [name] MRN: [id number] Date of Birth: 13-Sep-1951 Referring Provider (PT): [name] MD   Encounter Date: 07/27/2020   PT End of Session - 07/27/20 1619    Visit Number 1    Number of Visits 16    Date for PT Re-Evaluation 09/22/20    PT Start Time 1434    PT Stop Time 1517    PT Time Calculation (min) 43 min    Activity Tolerance Patient tolerated treatment well;No increased pain    Behavior During Therapy WFL for tasks assessed/performed           Past Medical History:  Diagnosis Date  . Anxiety   . Arthritis   . Avascular necrosis (HCC)    Left hip  . Bipolar 1 disorder (HCC)   . COPD (chronic obstructive pulmonary disease) (HCC)   . Pneumonia   . PTSD (post-traumatic stress disorder)     Past Surgical History:  Procedure Laterality Date  . bone spurs foot Right   . COLONOSCOPY    . HAND SURGERY Left   . JOINT REPLACEMENT    . TONSILLECTOMY    . TONSILLECTOMY AND ADENOIDECTOMY    . TOTAL HIP ARTHROPLASTY Left 03/19/2019   Procedure: LEFT TOTAL HIP ARTHROPLASTY ANTERIOR APPROACH;  Surgeon: [name], MD;  Location: [facility];  Service: Orthopedics;  Laterality: Left;  . TOTAL HIP ARTHROPLASTY Right 06/04/2019   Procedure: RIGHT TOTAL HIP ARTHROPLASTY ANTERIOR APPROACH;  Surgeon: [name], MD;  Location: [facility];  Service: Orthopedics;  Laterality: Right;    There were no vitals filed for this visit.    Subjective Assessment - 07/27/20 1615    Subjective Judy [name] has had low back pain for years.  She got great relief with an epidural months ago, but has had increasing L sided low back pain and occasional R sciatica over the past several weeks.    Pertinent History B THAs 2020 and COPD chronic    Limitations Sitting;Standing;Walking     How long can you sit comfortably? 20 minutes    How long can you stand comfortably? A few minutes    How long can you walk comfortably? A few minutes    Currently in Pain? Yes    Pain Score 5     Pain Location Back    Pain Orientation Left;Lower    Pain Descriptors / Indicators Aching;Sore    Pain Type Chronic pain    Pain Radiating Towards R knee    Pain Onset More than a month ago    Pain Frequency Intermittent    Aggravating Factors  Sitting, standing and walking    Pain Relieving Factors Change of position    Effect of Pain on Daily Activities WB function is severely limited              [facility] PT Assessment - 07/27/20 0001      Assessment   Medical Diagnosis Low back pain with sciatica    Referring Provider (PT) [name] MD    Onset Date/Surgical Date --   Chronic     Balance Screen   Has the patient fallen in the past 6 months No    Has the patient had a decrease in activity level because of a fear of falling?  No    Is the patient  reluctant to leave their home because of a fear of falling?  No      Prior Function   Level of Independence Independent      Cognition   Overall Cognitive Status Within Functional Limits for tasks assessed      Observation/Other Assessments   Focus on Therapeutic Outcomes (FOTO)  48 (64 Goal)      ROM / Strength   AROM / PROM / Strength AROM;Strength      AROM   Overall AROM  Deficits    AROM Assessment Site Lumbar;Hip    Right/Left Hip Left;Right    Right Hip Flexion 110    Right Hip External Rotation  30    Right Hip Internal Rotation  12    Left Hip Flexion 105    Left Hip External Rotation  25    Left Hip Internal Rotation  2    Lumbar Extension 5      Flexibility   Soft Tissue Assessment /Muscle Length yes    Hamstrings 45 degrees L/40 degrees R                      Objective measurements completed on examination: See above findings.       [facility] Adult PT Treatment/Exercise - 07/27/20  0001      Posture/Postural Control   Posture/Postural Control Postural limitations    Postural Limitations Forward head;Rounded Shoulders;Decreased lumbar lordosis      Exercises   Exercises Lumbar      Lumbar Exercises: Standing   Heel Raises 10 reps;3 seconds   Heel to toe with transversus abdominus activation   Scapular Retraction Strengthening;10 reps   5 seconds   Other Standing Lumbar Exercises Standing trunk extension AROM 10X 3 seconds      Lumbar Exercises: Seated   Sit to Stand 5 reps   slow eccentrics with SBP and trunk extension at top                 PT Education - 07/27/20 1618    Education Details Reviewed spine anatomy, imaging results and exam results.  Basic body mechanics and prognosis were addressed.    Person(s) Educated Patient    Methods Explanation;Demonstration;Verbal cues;Handout    Comprehension Verbal cues required;Returned demonstration;Need further instruction;Verbalized understanding               PT Long Term Goals - 07/27/20 1627      PT LONG TERM GOAL #1   Title Improve FOTO score to 64.    Baseline 48    Time 8    Period Weeks    Status New    Target Date 09/22/20      PT LONG TERM GOAL #2   Title Improve standing and walking endurance to a comfortable 15-20 minutes with appropriate modifications for COPD (pursed-lip breathing and standing breaks).    Baseline A few minutes.    Time 8    Period Weeks    Status New    Target Date 09/22/20      PT LONG TERM GOAL #3   Title Judy [name] will report low back pain consistently 0-3/10 on the Numeric Pain Rating Scale without sciatica.    Baseline Can be > 5/10 with sciatica.    Time 8    Period Weeks    Status New    Target Date 09/22/20      PT LONG TERM GOAL #4   Title Judy [name] will be independent and compliant  with her HEP at DC.    Time 8    Period Weeks    Status New    Target Date 09/22/20                  Plan - 07/27/20 1620    Clinical Impression Statement  Judy [name] has a history of low back pain with L sciatica.  This episode involves R sciatica.  She had B THA last year and has had relief from a previous epidural injection.  Postural/core weakness and body mechanics will be the primary emphasis with her rehabilitation.  Her prognosis is good with consistent attendance and appropriate HEP compliance.  COPD may affect her pace of recovery.    Personal Factors and Comorbidities Comorbidity 1    Comorbidities COPD and B THAs    Examination-Activity Limitations Sit;Bend;Lift;Squat;Locomotion Level;Stairs;Carry;Stand    Examination-Participation Restrictions Interpersonal Relationship;Community Activity    Stability/Clinical Decision Making Stable/Uncomplicated    Clinical Decision Making Low    Rehab Potential Good    PT Frequency 2x / week    PT Duration 8 weeks    PT Treatment/Interventions ADLs/Self Care Home Management;Cryotherapy;Traction;Therapeutic activities;Gait training;Therapeutic exercise;Neuromuscular re-education;Patient/family education;Manual techniques;Dry needling    PT Next Visit Plan Body mechanics education, postural correction, core and leg strengthening    PT Home Exercise Plan Access Code: [email or web address]           Patient will benefit from skilled therapeutic intervention in order to improve the following deficits and impairments:  Cardiopulmonary status limiting activity, Decreased activity tolerance, Decreased endurance, Decreased range of motion, Decreased strength, Difficulty walking, Hypomobility, Impaired flexibility, Increased muscle spasms, Postural dysfunction, Improper body mechanics, Pain  Visit Diagnosis: 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 Avascular necrosis of bone of right hip (HCC)  04/28/2019  . Status post total replacement of left hip 03/19/2019  . Avascular necrosis of bone of hip, 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  . PTSD (post-traumatic stress disorder)   . COPD (chronic obstructive pulmonary disease) (HCC)   . Bipolar 1 disorder (HCC)   . Anxiety   . B12 deficiency 12/09/2018  . COPD with acute exacerbation (HCC) 04/14/2017  . Hyperglycemia 04/14/2017    Cherlyn Cushing PT, MPT 07/27/2020, 4:33 PM  [facility] [address] [place], [place], [address] Phone: [phone]   Fax:  [phone]  Name: Judy [name] MRN: [id number] Date of Birth: 04/02/1951

## 2020-07-31 ENCOUNTER — Other Ambulatory Visit: Payer: Self-pay

## 2020-07-31 ENCOUNTER — Ambulatory Visit (INDEPENDENT_AMBULATORY_CARE_PROVIDER_SITE_OTHER): Payer: Medicare Other | Admitting: Physical Therapy

## 2020-07-31 DIAGNOSIS — M6281 Muscle weakness (generalized): Secondary | ICD-10-CM

## 2020-07-31 DIAGNOSIS — M5441 Lumbago with sciatica, right side: Secondary | ICD-10-CM

## 2020-07-31 DIAGNOSIS — R262 Difficulty in walking, not elsewhere classified: Secondary | ICD-10-CM | POA: Diagnosis not present

## 2020-07-31 DIAGNOSIS — G8929 Other chronic pain: Secondary | ICD-10-CM

## 2020-07-31 DIAGNOSIS — R293 Abnormal posture: Secondary | ICD-10-CM | POA: Diagnosis not present

## 2020-07-31 NOTE — Therapy (Signed)
[facility] Physical Therapy [address] [place], [place], [address] Phone: [phone]   Fax:  [phone]  Physical Therapy Treatment  Patient Details  Name: Judy [name] MRN: [id number] Date of Birth: [birth year] Referring Provider (PT): [name] MD   Encounter Date: 07/31/2020   PT End of Session - 07/31/20 1223    Visit Number 2    Number of Visits 16    Date for PT Re-Evaluation 09/22/20    PT Start Time 1145    PT Stop Time 1225    PT Time Calculation (min) 40 min    Activity Tolerance Patient tolerated treatment well;No increased pain    Behavior During Therapy WFL for tasks assessed/performed           Past Medical History:  Diagnosis Date  . Anxiety   . Arthritis   . Avascular necrosis (HCC)    Left hip  . Bipolar 1 disorder (HCC)   . COPD (chronic obstructive pulmonary disease) (HCC)   . Pneumonia   . PTSD (post-traumatic stress disorder)     Past Surgical History:  Procedure Laterality Date  . bone spurs foot Right   . COLONOSCOPY    . HAND SURGERY Left   . JOINT REPLACEMENT    . TONSILLECTOMY    . TONSILLECTOMY AND ADENOIDECTOMY    . TOTAL HIP ARTHROPLASTY Left 03/19/2019   Procedure: LEFT TOTAL HIP ARTHROPLASTY ANTERIOR APPROACH;  Surgeon: [name], MD;  Location: [facility];  Service: Orthopedics;  Laterality: Left;  . TOTAL HIP ARTHROPLASTY Right 06/04/2019   Procedure: RIGHT TOTAL HIP ARTHROPLASTY ANTERIOR APPROACH;  Surgeon: [name], MD;  Location: [facility];  Service: Orthopedics;  Laterality: Right;    There were no vitals filed for this visit.   Subjective Assessment - 07/31/20 1154    Subjective She relays no sciatica pain down the leg, the back pain feels more like the muscle on the left side today and rates a 2-3 out of 10 pain.    Pertinent History B THAs 2020 and COPD chronic    Limitations Sitting;Standing;Walking    How long can you sit comfortably? 20 minutes    How  long can you stand comfortably? A few minutes    How long can you walk comfortably? A few minutes    Pain Onset More than a month ago              [facility] Adult PT Treatment/Exercise - 07/31/20 0001      Lumbar Exercises: Stretches   Single Knee to Chest Stretch Right;Left;2 reps;30 seconds    Lower Trunk Rotation 5 reps;10 seconds    Other Lumbar Stretch Exercise standing lumbar ext 2 X 10 reps    Other Lumbar Stretch Exercise seated p ball lumbar flexion stretch 10 X 10 sec      Lumbar Exercises: Aerobic   Nustep L5  X6 min UE/LE with heat to her back      Lumbar Exercises: Machines for Strengthening   Leg Press bilat push 62 lbs 3X10 reps      Lumbar Exercises: Standing   Heel Raises 10 reps    Heel Raises Limitations 2 sets, heel and toe raises    Row Both    Theraband Level (Row) Level 3 (Green)    Row Limitations 2 sets of 15    Shoulder Extension Both    Theraband Level (Shoulder Extension) Level 3 (Green)    Shoulder Extension Limitations 2 sets of 15  Lumbar Exercises: Supine   Ab Set 15 reps;5 seconds    AB Set Limitations isometric pushing into pball in hooklying    Clam Limitations green 2X15    Bridge Limitations 2 sets of 10                       PT Long Term Goals - 07/27/20 1627      PT LONG TERM GOAL #1   Title Improve FOTO score to 64.    Baseline 48    Time 8    Period Weeks    Status New    Target Date 09/22/20      PT LONG TERM GOAL #2   Title Improve standing and walking endurance to a comfortable 15-20 minutes with appropriate modifications for COPD (pursed-lip breathing and standing breaks).    Baseline A few minutes.    Time 8    Period Weeks    Status New    Target Date 09/22/20      PT LONG TERM GOAL #3   Title Judy [name] will report low back pain consistently 0-3/10 on the Numeric Pain Rating Scale without sciatica.    Baseline Can be > 5/10 with sciatica.    Time 8    Period Weeks    Status New    Target Date  09/22/20      PT LONG TERM GOAL #4   Title Judy [name] will be independent and compliant with her HEP at DC.    Time 8    Period Weeks    Status New    Target Date 09/22/20                 Plan - 07/31/20 1224    Clinical Impression Statement Session focused on lumbar and core stretching and strengthening as tolerated. She did have a little more pain in buttock after the exercises but this was relieved with standing lumbar extension at end of session. Continue POC.    Personal Factors and Comorbidities Comorbidity 1    Comorbidities COPD and B THAs    Examination-Activity Limitations Sit;Bend;Lift;Squat;Locomotion Level;Stairs;Carry;Stand    Examination-Participation Restrictions Interpersonal Relationship;Community Activity    Stability/Clinical Decision Making Stable/Uncomplicated    Rehab Potential Good    PT Frequency 2x / week    PT Duration 8 weeks    PT Treatment/Interventions ADLs/Self Care Home Management;Cryotherapy;Traction;Therapeutic activities;Gait training;Therapeutic exercise;Neuromuscular re-education;Patient/family education;Manual techniques;Dry needling    PT Next Visit Plan Body mechanics education, postural correction, core and leg strengthening    PT Home Exercise Plan Access Code: [email or web address]           Patient will benefit from skilled therapeutic intervention in order to improve the following deficits and impairments:  Cardiopulmonary status limiting activity, Decreased activity tolerance, Decreased endurance, Decreased range of motion, Decreased strength, Difficulty walking, Hypomobility, Impaired flexibility, Increased muscle spasms, Postural dysfunction, Improper body mechanics, Pain  Visit Diagnosis: 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 Status post total replacement of right hip  06/04/2019  . 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  . Status post total replacement of left hip 03/19/2019  . Avascular necrosis of bone of hip, 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  . PTSD (post-traumatic stress disorder)   . COPD (chronic obstructive pulmonary disease) (HCC)   . Bipolar 1 disorder (HCC)   .  Anxiety   . B12 deficiency 12/09/2018  . COPD with acute exacerbation (HCC) 04/14/2017  . Hyperglycemia 04/14/2017    Judy [name] 07/31/2020, 12:30 PM  [facility] Physical Therapy [address] [place], [place], [address] Phone: [phone]   Fax:  [phone]  Name: Judy [name] MRN: [id number] Date of Birth: 1950/12/09

## 2020-08-07 ENCOUNTER — Encounter: Payer: Self-pay | Admitting: Physical Therapy

## 2020-08-07 ENCOUNTER — Ambulatory Visit (INDEPENDENT_AMBULATORY_CARE_PROVIDER_SITE_OTHER): Payer: Medicare Other | Admitting: Physical Therapy

## 2020-08-07 ENCOUNTER — Other Ambulatory Visit: Payer: Self-pay

## 2020-08-07 DIAGNOSIS — R262 Difficulty in walking, not elsewhere classified: Secondary | ICD-10-CM

## 2020-08-07 DIAGNOSIS — G8929 Other chronic pain: Secondary | ICD-10-CM

## 2020-08-07 DIAGNOSIS — M5441 Lumbago with sciatica, right side: Secondary | ICD-10-CM | POA: Diagnosis not present

## 2020-08-07 DIAGNOSIS — R293 Abnormal posture: Secondary | ICD-10-CM | POA: Diagnosis not present

## 2020-08-07 DIAGNOSIS — M6281 Muscle weakness (generalized): Secondary | ICD-10-CM

## 2020-08-07 NOTE — Therapy (Signed)
[facility] Physical Therapy [address] [place], [place], [address] Phone: [phone]   Fax:  [phone]  Physical Therapy Treatment  Patient Details  Name: Judy [name] MRN: [id number] Date of Birth: [birth year] Referring Provider (PT): [name] MD   Encounter Date: 08/07/2020   PT End of Session - 08/07/20 1323    Visit Number 3    Number of Visits 16    Date for PT Re-Evaluation 09/22/20    PT Start Time 1305    PT Stop Time 1345    PT Time Calculation (min) 40 min    Activity Tolerance Patient tolerated treatment well;No increased pain    Behavior During Therapy WFL for tasks assessed/performed           Past Medical History:  Diagnosis Date  . Anxiety   . Arthritis   . Avascular necrosis (HCC)    Left hip  . Bipolar 1 disorder (HCC)   . COPD (chronic obstructive pulmonary disease) (HCC)   . Pneumonia   . PTSD (post-traumatic stress disorder)     Past Surgical History:  Procedure Laterality Date  . bone spurs foot Right   . COLONOSCOPY    . HAND SURGERY Left   . JOINT REPLACEMENT    . TONSILLECTOMY    . TONSILLECTOMY AND ADENOIDECTOMY    . TOTAL HIP ARTHROPLASTY Left 03/19/2019   Procedure: LEFT TOTAL HIP ARTHROPLASTY ANTERIOR APPROACH;  Surgeon: [name], MD;  Location: [facility];  Service: Orthopedics;  Laterality: Left;  . TOTAL HIP ARTHROPLASTY Right 06/04/2019   Procedure: RIGHT TOTAL HIP ARTHROPLASTY ANTERIOR APPROACH;  Surgeon: [name], MD;  Location: [facility];  Service: Orthopedics;  Laterality: Right;    There were no vitals filed for this visit.   Subjective Assessment - 08/07/20 1312    Subjective Pt reporting no radiation down R LE upon arrival. Pt reporting low back has been "killing her" on the left side. Pt reporting taking lots of tylenol.    Pertinent History B THAs 2020 and COPD chronic    Limitations Sitting;Standing;Walking    Currently in Pain? Yes    Pain Score 2      Pain Location Back    Pain Orientation Left    Pain Descriptors / Indicators Aching    Pain Type Chronic pain    Pain Onset More than a month ago                             [facility] Adult PT Treatment/Exercise - 08/07/20 0001      Exercises   Exercises Lumbar      Lumbar Exercises: Stretches   Single Knee to Chest Stretch Right;Left;2 reps;30 seconds    Lower Trunk Rotation 5 reps;10 seconds    Other Lumbar Stretch Exercise standing lumbar ext 2 X 10 reps    Other Lumbar Stretch Exercise seated p ball lumbar flexion stretch 10 X 10 sec      Lumbar Exercises: Aerobic   Recumbent Bike L2 x 3 minutes   pt reported she liked the Nustep better and wanted to change   Nustep L5 x 5 minutes      Lumbar Exercises: Supine   Ab Set 15 reps;5 seconds    AB Set Limitations with supine marching for core activation    Clam 15 reps    Clam Limitations green theraband    Bridge 15 reps  PT Long Term Goals - 08/07/20 1341      PT LONG TERM GOAL #1   Title Improve FOTO score to 64.    Period Weeks    Status On-going      PT LONG TERM GOAL #2   Title Improve standing and walking endurance to a comfortable 15-20 minutes with appropriate modifications for COPD (pursed-lip breathing and standing breaks).    Status On-going      PT LONG TERM GOAL #3   Title [name] will report low back pain consistently 0-3/10 on the Numeric Pain Rating Scale without sciatica.    Baseline Can be > 5/10 with sciatica.    Status On-going      PT LONG TERM GOAL #4   Title [name] will be independent and compliant with her HEP at DC.    Status On-going                 Plan - 08/07/20 1325    Clinical Impression Statement Pt tolerating treatment well progressing with core strengthening and stability. Pt still having pain in her left flank rated 2/10. Pt with pin point tenderness in L lumbar paraspinals. STM performed to pt wtih relief reported  afterward. Conitnue skilled PT.    Personal Factors and Comorbidities Comorbidity 1    Comorbidities COPD and B THAs    Examination-Activity Limitations Sit;Bend;Lift;Squat;Locomotion Level;Stairs;Carry;Stand    Examination-Participation Restrictions Interpersonal Relationship;Community Activity    Stability/Clinical Decision Making Stable/Uncomplicated    Rehab Potential Good    PT Frequency 2x / week    PT Duration 8 weeks    PT Treatment/Interventions ADLs/Self Care Home Management;Cryotherapy;Traction;Therapeutic activities;Gait training;Therapeutic exercise;Neuromuscular re-education;Patient/family education;Manual techniques;Dry needling    PT Next Visit Plan Body mechanics education, postural correction, core and leg strengthening    Consulted and Agree with Plan of Care Patient           Patient will benefit from skilled therapeutic intervention in order to improve the following deficits and impairments:  Cardiopulmonary status limiting activity, Decreased activity tolerance, Decreased endurance, Decreased range of motion, Decreased strength, Difficulty walking, Hypomobility, Impaired flexibility, Increased muscle spasms, Postural dysfunction, Improper body mechanics, Pain  Visit Diagnosis: 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  . Status post total replacement of left hip 03/19/2019  . Avascular necrosis of bone of hip, 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  . PTSD (post-traumatic stress disorder)   . COPD (chronic obstructive pulmonary disease) (HCC)   . Bipolar 1 disorder (HCC)   . Anxiety   . B12 deficiency 12/09/2018  . COPD with acute exacerbation (HCC) 04/14/2017  . Hyperglycemia 04/14/2017    Sharmon Leyden, PT, MPT 08/07/2020, 3:12 PM  [facility] Physical Therapy [address] [place], [place], [address] Phone: [phone]   Fax:  [phone]  Name: JEANNEMARIE SAWAYA MRN: [id number] Date of Birth: 1951-02-29

## 2020-08-11 ENCOUNTER — Encounter: Payer: Self-pay | Admitting: Rehabilitative and Restorative Service Providers"

## 2020-08-11 ENCOUNTER — Ambulatory Visit (INDEPENDENT_AMBULATORY_CARE_PROVIDER_SITE_OTHER): Payer: Medicare Other | Admitting: Rehabilitative and Restorative Service Providers"

## 2020-08-11 ENCOUNTER — Other Ambulatory Visit: Payer: Self-pay

## 2020-08-11 DIAGNOSIS — R293 Abnormal posture: Secondary | ICD-10-CM | POA: Diagnosis not present

## 2020-08-11 DIAGNOSIS — M6281 Muscle weakness (generalized): Secondary | ICD-10-CM | POA: Diagnosis not present

## 2020-08-11 DIAGNOSIS — R262 Difficulty in walking, not elsewhere classified: Secondary | ICD-10-CM

## 2020-08-11 DIAGNOSIS — G8929 Other chronic pain: Secondary | ICD-10-CM

## 2020-08-11 DIAGNOSIS — M5441 Lumbago with sciatica, right side: Secondary | ICD-10-CM | POA: Diagnosis not present

## 2020-08-11 NOTE — Therapy (Signed)
[facility] Physical Therapy [address] [place], [place], [address] Phone: [phone]   Fax:  [phone]  Physical Therapy Treatment  Patient Details  Name: Judy [name] MRN: [id number] Date of Birth: 1951-02-25 Referring Provider (PT): [name] MD   Encounter Date: 08/11/2020   PT End of Session - 08/11/20 1632    Visit Number 4    Number of Visits 16    Date for PT Re-Evaluation 09/22/20    PT Start Time 1342    PT Stop Time 1424    PT Time Calculation (min) 42 min    Activity Tolerance Patient tolerated treatment well;No increased pain    Behavior During Therapy WFL for tasks assessed/performed           Past Medical History:  Diagnosis Date  . Anxiety   . Arthritis   . Avascular necrosis (HCC)    Left hip  . Bipolar 1 disorder (HCC)   . COPD (chronic obstructive pulmonary disease) (HCC)   . Pneumonia   . PTSD (post-traumatic stress disorder)     Past Surgical History:  Procedure Laterality Date  . bone spurs foot Right   . COLONOSCOPY    . HAND SURGERY Left   . JOINT REPLACEMENT    . TONSILLECTOMY    . TONSILLECTOMY AND ADENOIDECTOMY    . TOTAL HIP ARTHROPLASTY Left 03/19/2019   Procedure: LEFT TOTAL HIP ARTHROPLASTY ANTERIOR APPROACH;  Surgeon: [name], MD;  Location: [facility];  Service: Orthopedics;  Laterality: Left;  . TOTAL HIP ARTHROPLASTY Right 06/04/2019   Procedure: RIGHT TOTAL HIP ARTHROPLASTY ANTERIOR APPROACH;  Surgeon: [name], MD;  Location: [facility];  Service: Orthopedics;  Laterality: Right;    There were no vitals filed for this visit.   Subjective Assessment - 08/11/20 1629    Subjective [name] reports intermittent HEP compliance.  COPD limits walking program participation.    Pertinent History B THAs 2020 and COPD chronic    Limitations Sitting;Standing;Walking    How long can you sit comfortably? 20 minutes    How long can you stand comfortably? A few minutes     How long can you walk comfortably? A few minutes    Currently in Pain? Yes    Pain Score 3     Pain Location Back    Pain Orientation Left    Pain Descriptors / Indicators Aching;Sore    Pain Type Chronic pain    Pain Radiating Towards R thigh    Pain Onset More than a month ago    Pain Frequency Intermittent    Aggravating Factors  Prolonged postures    Pain Relieving Factors Change of position    Effect of Pain on Daily Activities WB function is severely limited    Multiple Pain Sites No                             [facility] Adult PT Treatment/Exercise - 08/11/20 0001      Therapeutic Activites    Therapeutic Activities Lifting;ADL's    ADL's Posture and body mechanics    Lifting Golfer's and diagonal lift      Exercises   Exercises Lumbar      Lumbar Exercises: Stretches   Single Knee to Chest Stretch Left;Right;1 rep;20 seconds    Piriformis Stretch Left;Right;5 reps;20 seconds      Lumbar Exercises: Standing   Heel Raises 10 reps;3 seconds   Heel to  toe with pelvic stabilization   Scapular Retraction Strengthening;10 reps   5 seconds   Other Standing Lumbar Exercises Standing trunk extension AROM 10X 3 seconds    Other Standing Lumbar Exercises Alternating hip hike with pelvic stabilization in door frame 10X 3 seconds      Lumbar Exercises: Seated   Sit to Stand 5 reps   slow eccentrics with SBP and trunk extension at top     Lumbar Exercises: Sidelying   Hip Abduction 10 reps;3 seconds;Both      Lumbar Exercises: Prone   Straight Leg Raise 10 reps;3 seconds   2 sets forehead on forearms alternating                 PT Education - 08/11/20 1631    Education Details Reviewed practical body mechanics and progressed her HEP.    Person(s) Educated Patient    Methods Explanation;Demonstration;Verbal cues;Handout    Comprehension Need further instruction;Returned demonstration;Verbal cues required;Verbalized understanding                PT Long Term Goals - 08/11/20 1632      PT LONG TERM GOAL #1   Title Improve FOTO score to 64.    Period Weeks    Status On-going      PT LONG TERM GOAL #2   Title Improve standing and walking endurance to a comfortable 15-20 minutes with appropriate modifications for COPD (pursed-lip breathing and standing breaks).    Status On-going      PT LONG TERM GOAL #3   Title [name] will report low back pain consistently 0-3/10 on the Numeric Pain Rating Scale without sciatica.    Baseline Can be > 5/10 with sciatica.    Status On-going      PT LONG TERM GOAL #4   Title [name] will be independent and compliant with her HEP at DC.    Status On-going                 Plan - 08/11/20 1633    Clinical Impression Statement [name] has not been able to walk as much as recommended due to her COPD.  HEP compliance will be an emphasis along with continued strength and body mechanics work to meet long-term goals established at evaluation.    Personal Factors and Comorbidities Comorbidity 1    Comorbidities COPD and B THAs    Examination-Activity Limitations Sit;Bend;Lift;Squat;Locomotion Level;Stairs;Carry;Stand    Examination-Participation Restrictions Interpersonal Relationship;Community Activity    Stability/Clinical Decision Making Stable/Uncomplicated    Rehab Potential Good    PT Frequency 2x / week    PT Duration 8 weeks    PT Treatment/Interventions ADLs/Self Care Home Management;Cryotherapy;Traction;Therapeutic activities;Gait training;Therapeutic exercise;Neuromuscular re-education;Patient/family education;Manual techniques;Dry needling    PT Next Visit Plan Body mechanics education, postural correction, core and leg strengthening    PT Home Exercise Plan Access Code: [email or web address]    Consulted and Agree with Plan of Care Patient           Patient will benefit from skilled therapeutic intervention in order to improve the following deficits and impairments:  Cardiopulmonary status  limiting activity, Decreased activity tolerance, Decreased endurance, Decreased range of motion, Decreased strength, Difficulty walking, Hypomobility, Impaired flexibility, Increased muscle spasms, Postural dysfunction, Improper body mechanics, Pain  Visit Diagnosis: Abnormal posture  Difficulty walking  Muscle weakness (generalized)  Chronic bilateral low back pain with right-sided sciatica     Problem List Patient Active Problem List   Diagnosis Date Noted  . Status  post total replacement of right hip 06/04/2019  . 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  . Status post total replacement of left hip 03/19/2019  . Avascular necrosis of bone of hip, 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  . PTSD (post-traumatic stress disorder)   . COPD (chronic obstructive pulmonary disease) (HCC)   . Bipolar 1 disorder (HCC)   . Anxiety   . B12 deficiency 12/09/2018  . COPD with acute exacerbation (HCC) 04/14/2017  . Hyperglycemia 04/14/2017    Cherlyn Cushing PT, MPT 08/11/2020, 4:35 PM  [facility] Physical Therapy [address] [place], [place], [address] Phone: [phone]   Fax:  [phone]  Name: BLANCA CARREON MRN: [id number] Date of Birth: 1950/12/08

## 2020-08-11 NOTE — Patient Instructions (Signed)
Access Code: [email or web address] URL: [email or web address] Date: 08/11/2020 Prepared by: Pauletta Browns  Exercises Prone Hip Extension - 1 x daily - 7 x weekly - 2 sets - 10 reps - 3 seconds hold Standing Hip Hiking - 1 x daily - 7 x weekly - 2 sets - 10 reps - 3 seconds hold Supine Figure 4 Piriformis Stretch - 2-3 x daily - 7 x weekly - 1 sets - 5 reps - 20 seconds hold

## 2020-08-14 ENCOUNTER — Other Ambulatory Visit: Payer: Self-pay

## 2020-08-14 ENCOUNTER — Ambulatory Visit (INDEPENDENT_AMBULATORY_CARE_PROVIDER_SITE_OTHER): Payer: Medicare Other | Admitting: Physical Therapy

## 2020-08-14 VITALS — BP 119/79

## 2020-08-14 DIAGNOSIS — M6281 Muscle weakness (generalized): Secondary | ICD-10-CM | POA: Diagnosis not present

## 2020-08-14 DIAGNOSIS — M5441 Lumbago with sciatica, right side: Secondary | ICD-10-CM

## 2020-08-14 DIAGNOSIS — R293 Abnormal posture: Secondary | ICD-10-CM

## 2020-08-14 DIAGNOSIS — R262 Difficulty in walking, not elsewhere classified: Secondary | ICD-10-CM

## 2020-08-14 DIAGNOSIS — G8929 Other chronic pain: Secondary | ICD-10-CM

## 2020-08-14 NOTE — Therapy (Signed)
[facility] Physical Therapy [address] [place], [place], [address] Phone: [phone]   Fax:  [phone]  Physical Therapy Treatment  Patient Details  Name: Judy [name] MRN: [id number] Date of Birth: [birth year] Referring Provider (PT): [name] MD   Encounter Date: 08/14/2020   PT End of Session - 08/14/20 1324    Visit Number 5    Number of Visits 16    Date for PT Re-Evaluation 09/22/20    PT Start Time 1300    PT Stop Time 1338    PT Time Calculation (min) 38 min    Activity Tolerance Patient tolerated treatment well;No increased pain    Behavior During Therapy WFL for tasks assessed/performed           Past Medical History:  Diagnosis Date  . Anxiety   . Arthritis   . Avascular necrosis (HCC)    Left hip  . Bipolar 1 disorder (HCC)   . COPD (chronic obstructive pulmonary disease) (HCC)   . Pneumonia   . PTSD (post-traumatic stress disorder)     Past Surgical History:  Procedure Laterality Date  . bone spurs foot Right   . COLONOSCOPY    . HAND SURGERY Left   . JOINT REPLACEMENT    . TONSILLECTOMY    . TONSILLECTOMY AND ADENOIDECTOMY    . TOTAL HIP ARTHROPLASTY Left 03/19/2019   Procedure: LEFT TOTAL HIP ARTHROPLASTY ANTERIOR APPROACH;  Surgeon: [name], MD;  Location: [facility];  Service: Orthopedics;  Laterality: Left;  . TOTAL HIP ARTHROPLASTY Right 06/04/2019   Procedure: RIGHT TOTAL HIP ARTHROPLASTY ANTERIOR APPROACH;  Surgeon: [name], MD;  Location: [facility];  Service: Orthopedics;  Laterality: Right;    Vitals:   08/14/20 1307  BP: 119/79     Subjective Assessment - 08/14/20 1307    Subjective Pt arriving today reporting she has been losing her balance to the left side all morning. Pt reporting episode of "the room was spinning when she first got up this morning"    Pertinent History B THAs 2020 and COPD chronic    Limitations Sitting;Standing;Walking    How long can you sit  comfortably? 20 minutes    How long can you stand comfortably? A few minutes    How long can you walk comfortably? A few minutes    Currently in Pain? Yes    Pain Score 2     Pain Location Back    Pain Orientation Left;Lower    Pain Descriptors / Indicators Aching    Pain Type Chronic pain    Pain Onset More than a month ago    Pain Frequency Constant                             [facility] Adult PT Treatment/Exercise - 08/14/20 0001      Exercises   Exercises Lumbar      Lumbar Exercises: Seated   Long Arc Quad on Chair Strengthening;Both;10 reps    Other Seated Lumbar Exercises ball squeezes x 15 holding 3 seconds each      Lumbar Exercises: Supine   Other Supine Lumbar Exercises marching with core activation      Lumbar Exercises: Sidelying   Hip Abduction 10 reps;3 seconds;Both      Lumbar Exercises: Prone   Straight Leg Raise --   2 sets forehead on forearms alternating     Modalities   Modalities Electrical Stimulation  Emergency planning/management officer R lumbar paraspinals    Electrical Stimulation Action Pre-modulated    Electrical Stimulation Parameters 80-150 Hz x 15 minutes    Electrical Stimulation Goals Tone;Pain      Manual Therapy   Manual therapy comments Percussion to R sided lumbar paraspinals, STM: to R piriformis                       PT Long Term Goals - 08/11/20 1632      PT LONG TERM GOAL #1   Title Improve FOTO score to 64.    Period Weeks    Status On-going      PT LONG TERM GOAL #2   Title Improve standing and walking endurance to a comfortable 15-20 minutes with appropriate modifications for COPD (pursed-lip breathing and standing breaks).    Status On-going      PT LONG TERM GOAL #3   Title Judy [name] will report low back pain consistently 0-3/10 on the Numeric Pain Rating Scale without sciatica.    Baseline Can be > 5/10 with sciatica.    Status On-going      PT LONG TERM GOAL #4     Title Judy [name] will be independent and compliant with her HEP at DC.    Status On-going                 Plan - 08/14/20 1337    Clinical Impression Statement Pt with decreased blance noted when getting up from a chair in the lobby. Pt with lateral lean to left side upon standing. Pt's BP was taken at 119/79, HR 87 bpm. Pt tolerating sitting and supine exericses today with STM and percussion performed to left lumbar paraspinals. Pt reporting less pain at end of session. BP taken after performing seated exercises 132/87 HR 89. Pt was instructed to call her PCP or neurologist if her condition worsens or she continue to notice LOB to the left side or dizziness. Continue skilled PT.    Personal Factors and Comorbidities Comorbidity 1    Comorbidities COPD and B THAs    Examination-Activity Limitations Sit;Bend;Lift;Squat;Locomotion Level;Stairs;Carry;Stand    Examination-Participation Restrictions Interpersonal Relationship;Community Activity    Stability/Clinical Decision Making Stable/Uncomplicated    Rehab Potential Good    PT Frequency 2x / week    PT Duration 8 weeks    PT Treatment/Interventions ADLs/Self Care Home Management;Cryotherapy;Traction;Therapeutic activities;Gait training;Therapeutic exercise;Neuromuscular re-education;Patient/family education;Manual techniques;Dry needling    PT Next Visit Plan Body mechanics education, postural correction, core and leg strengthening    PT Home Exercise Plan Access Code: [email or web address]    Consulted and Agree with Plan of Care Patient           Patient will benefit from skilled therapeutic intervention in order to improve the following deficits and impairments:  Cardiopulmonary status limiting activity, Decreased activity tolerance, Decreased endurance, Decreased range of motion, Decreased strength, Difficulty walking, Hypomobility, Impaired flexibility, Increased muscle spasms, Postural dysfunction, Improper body mechanics, Pain  Visit  Diagnosis: Abnormal posture  Difficulty walking  Muscle weakness (generalized)  Chronic bilateral low back pain with right-sided sciatica  Chronic left-sided low back pain with right-sided sciatica     Problem List Patient Active Problem List   Diagnosis Date Noted  . 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  . Status post total replacement of left hip 03/19/2019  . Avascular necrosis of bone of hip,  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  . PTSD (post-traumatic stress disorder)   . COPD (chronic obstructive pulmonary disease) (HCC)   . Bipolar 1 disorder (HCC)   . Anxiety   . B12 deficiency 12/09/2018  . COPD with acute exacerbation (HCC) 04/14/2017  . Hyperglycemia 04/14/2017    Sharmon Leyden, PT, MPT 08/14/2020, 1:48 PM  [facility] Physical Therapy [address] [place], [place], [address] Phone: [phone]   Fax:  [phone]  Name: Judy [name] MRN: [id number] Date of Birth: 02/10/1951

## 2020-08-17 ENCOUNTER — Ambulatory Visit (INDEPENDENT_AMBULATORY_CARE_PROVIDER_SITE_OTHER): Payer: Medicare Other | Admitting: *Deleted

## 2020-08-17 ENCOUNTER — Other Ambulatory Visit: Payer: Self-pay

## 2020-08-17 VITALS — BP 121/77 | HR 83

## 2020-08-17 DIAGNOSIS — M45 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: Secondary | ICD-10-CM | POA: Diagnosis not present

## 2020-08-17 MED ORDER — CERTOLIZUMAB PEGOL 2 X 200 MG ~~LOC~~ KIT
400.0000 mg | PACK | Freq: Once | SUBCUTANEOUS | Status: AC
  Administered 2020-08-17: 400 mg via SUBCUTANEOUS

## 2020-08-17 NOTE — Progress Notes (Signed)
Pharmacy Note  Subjective:   Patient presents to clinic today to receive monthly dose of Cimzia.  Patient running a fever or have signs/symptoms of infection? No  Patient currently on antibiotics for the treatment of infection? No  Patient have any upcoming invasive procedures/surgeries? No  Objective: CMP     Component Value Date/Time   NA 140 06/22/2020 1043   K 4.0 06/22/2020 1043   CL 104 06/22/2020 1043   CO2 25 06/22/2020 1043   GLUCOSE 159 (H) 06/22/2020 1043   BUN 9 06/22/2020 1043   CREATININE 0.99 06/22/2020 1043   CALCIUM 9.9 06/22/2020 1043   PROT 6.3 06/22/2020 1043   ALBUMIN 3.8 12/11/2018 1023   AST 15 06/22/2020 1043   ALT 18 06/22/2020 1043   ALKPHOS 139 (H) 12/11/2018 1023   BILITOT 0.4 06/22/2020 1043   GFRNONAA 59 (L) 06/22/2020 1043   GFRAA 68 06/22/2020 1043    CBC    Component Value Date/Time   WBC 4.8 06/22/2020 1043   RBC 4.50 06/22/2020 1043   HGB 13.8 06/22/2020 1043   HCT 41.5 06/22/2020 1043   PLT 233 06/22/2020 1043   MCV 92.2 06/22/2020 1043   MCH 30.7 06/22/2020 1043   MCHC 33.3 06/22/2020 1043   RDW 13.9 06/22/2020 1043   LYMPHSABS 2,011 06/22/2020 1043   MONOABS 0.5 12/11/2018 1023   EOSABS 91 06/22/2020 1043   BASOSABS 10 06/22/2020 1043    Baseline Immunosuppressant Therapy Labs TB GOLD Quantiferon TB Gold Latest Ref Rng & Units 04/26/2020  Quantiferon TB Gold Plus NEGATIVE NEGATIVE   Hepatitis Panel Hepatitis Latest Ref Rng & Units 04/26/2020  Hep B Surface Ag NON-REACTI NON-REACTIVE  Hep B IgM NON-REACTI NON-REACTIVE  Hep C Ab NON-REACTI -  Hep C Ab NON-REACTI -   HIV Lab Results  Component Value Date   HIV NON-REACTIVE 04/26/2020   HIV Non Reactive 08/23/2017   HIV Non Reactive 04/14/2017   Immunoglobulins Immunoglobulin Electrophoresis Latest Ref Rng & Units 04/26/2020  IgA  70 - 320 mg/dL 120  IgG 600 - 1,540 mg/dL 824  IgM 50 - 300 mg/dL 47(L)   SPEP Serum Protein Electrophoresis Latest Ref Rng & Units  06/22/2020  Total Protein 6.1 - 8.1 g/dL 6.3  Albumin 3.8 - 4.8 g/dL -  Alpha-1 0.2 - 0.3 g/dL -  Alpha-2 0.5 - 0.9 g/dL -  Beta Globulin 0.4 - 0.6 g/dL -  Beta 2 0.2 - 0.5 g/dL -  Gamma Globulin 0.8 - 1.7 g/dL -   G6PD No results found for: G6PDH TPMT No results found for: TPMT   Chest x-ray:  No active cardiopulmonary disease.08/22/2017  Administrations This Visit    Certolizumab Pegol KIT 400 mg    Admin Date 08/17/2020 Action Given Dose 400 mg Route Subcutaneous Administered By Carole Binning, LPN          Assessment/Plan:   Patient tolerated injection well.   Appointment for next injection scheduled for 09/14/2020.  Patient due for labs in December 2020.  Patient is to call and reschedule appointment if running a fever with signs/symptoms of infection, on antibiotics for active infection or has an upcoming invasive procedure.  All questions encouraged and answered.  Instructed patient to call with any further questions or concerns.

## 2020-08-18 ENCOUNTER — Ambulatory Visit (INDEPENDENT_AMBULATORY_CARE_PROVIDER_SITE_OTHER): Payer: Medicare Other | Admitting: Rehabilitative and Restorative Service Providers"

## 2020-08-18 ENCOUNTER — Telehealth: Payer: Self-pay

## 2020-08-18 DIAGNOSIS — R262 Difficulty in walking, not elsewhere classified: Secondary | ICD-10-CM

## 2020-08-18 DIAGNOSIS — M6281 Muscle weakness (generalized): Secondary | ICD-10-CM | POA: Diagnosis not present

## 2020-08-18 DIAGNOSIS — M5441 Lumbago with sciatica, right side: Secondary | ICD-10-CM

## 2020-08-18 DIAGNOSIS — R293 Abnormal posture: Secondary | ICD-10-CM

## 2020-08-18 DIAGNOSIS — G8929 Other chronic pain: Secondary | ICD-10-CM

## 2020-08-18 NOTE — Telephone Encounter (Signed)
Patient advised she is due for labs at her next appointment for Cimzia. Patient advised she does not need to fast.

## 2020-08-18 NOTE — Therapy (Signed)
[facility] Physical Therapy [address] [place], [place], [address] Phone: [phone]   Fax:  [phone]  Physical Therapy Treatment  Patient Details  Name: Judy [name] MRN: [id number] Date of Birth: 07-27-1951 Referring Provider (PT): [name] MD   Encounter Date: 08/18/2020   PT End of Session - 08/18/20 1620    Visit Number 6    Number of Visits 16    Date for PT Re-Evaluation 09/22/20    PT Start Time 1301    PT Stop Time 1344    PT Time Calculation (min) 43 min    Activity Tolerance Patient tolerated treatment well;No increased pain    Behavior During Therapy WFL for tasks assessed/performed           Past Medical History:  Diagnosis Date  . Anxiety   . Arthritis   . Avascular necrosis (HCC)    Left hip  . Bipolar 1 disorder (HCC)   . COPD (chronic obstructive pulmonary disease) (HCC)   . Pneumonia   . PTSD (post-traumatic stress disorder)     Past Surgical History:  Procedure Laterality Date  . bone spurs foot Right   . COLONOSCOPY    . HAND SURGERY Left   . JOINT REPLACEMENT    . TONSILLECTOMY    . TONSILLECTOMY AND ADENOIDECTOMY    . TOTAL HIP ARTHROPLASTY Left 03/19/2019   Procedure: LEFT TOTAL HIP ARTHROPLASTY ANTERIOR APPROACH;  Surgeon: [name], MD;  Location: [facility];  Service: Orthopedics;  Laterality: Left;  . TOTAL HIP ARTHROPLASTY Right 06/04/2019   Procedure: RIGHT TOTAL HIP ARTHROPLASTY ANTERIOR APPROACH;  Surgeon: [name], MD;  Location: [facility];  Service: Orthopedics;  Laterality: Right;    There were no vitals filed for this visit.   Subjective Assessment - 08/18/20 1324    Subjective Pt arriving today reporting she has been losing her balance to the left side all morning. Pt reporting episode of "the room was spinning when she first got up this morning"    Pertinent History B THAs 2020 and COPD chronic    Limitations Sitting;Standing;Walking    How long can you  sit comfortably? 20-30 minutes (was 20)    How long can you stand comfortably? 10 minutes (was a few minutes)    How long can you walk comfortably? A few minutes    Currently in Pain? Yes    Pain Score 2     Pain Location Back    Pain Orientation Lower    Pain Descriptors / Indicators Sore;Tightness    Pain Type Chronic pain    Pain Radiating Towards No thigh    Pain Onset More than a month ago    Pain Frequency Intermittent    Aggravating Factors  Prolonged postures    Pain Relieving Factors Change position and exercises    Effect of Pain on Daily Activities Laundry and lifting                             [facility] Adult PT Treatment/Exercise - 08/18/20 0001      Therapeutic Activites    Therapeutic Activities Lifting    Lifting Golfer's and diagonal lift      Exercises   Exercises Lumbar      Lumbar Exercises: Stretches   Single Knee to Chest Stretch Left;Right;1 rep;20 seconds    Piriformis Stretch Left;Right;5 reps;20 seconds      Lumbar Exercises: Standing  Heel Raises 10 reps;3 seconds   Heel to toe with pelvic stabilization   Scapular Retraction Strengthening;10 reps   5 seconds   Other Standing Lumbar Exercises Standing trunk extension AROM 10X 3 seconds    Other Standing Lumbar Exercises Alternating hip hike with pelvic stabilization in door frame 10X 3 seconds   1 set hike only and 1 set of 5X 3 seconds with push     Lumbar Exercises: Seated   Sit to Stand 5 reps   slow eccentrics with SBP and trunk extension at top     Lumbar Exercises: Sidelying   Hip Abduction 10 reps;3 seconds;Both;Other (comment)   2 sets     Lumbar Exercises: Prone   Straight Leg Raise 10 reps;3 seconds   2 sets forehead on forearms alternating                 PT Education - 08/18/20 1619    Education Details Reviewed HEP.  Progressed body mechanics and hip abductors/core strength.    Person(s) Educated Patient    Methods Explanation;Demonstration;Verbal cues     Comprehension Verbalized understanding;Returned demonstration;Need further instruction;Verbal cues required               PT Long Term Goals - 08/18/20 1620      PT LONG TERM GOAL #1   Title Improve FOTO score to 64.    Period Weeks    Status On-going      PT LONG TERM GOAL #2   Title Improve standing and walking endurance to a comfortable 15-20 minutes with appropriate modifications for COPD (pursed-lip breathing and standing breaks).    Status On-going      PT LONG TERM GOAL #3   Title [name] will report low back pain consistently 0-3/10 on the Numeric Pain Rating Scale without sciatica.    Baseline Can be > 5/10 with sciatica.    Status On-going      PT LONG TERM GOAL #4   Title [name] will be independent and compliant with her HEP at DC.    Status On-going                 Plan - 08/18/20 1621    Clinical Impression Statement [name] reports less pain over the past few days.  She has also started working out at a gym in addition to her physical therapy exercises.  Mechanics are looking better although strength impairments make proper lifting techniques difficult.  Continue strength work to improve mechanics, decrease pain and improve [name]'s self-reported function.    Personal Factors and Comorbidities Comorbidity 1    Comorbidities COPD and B THAs    Examination-Activity Limitations Sit;Bend;Lift;Squat;Locomotion Level;Stairs;Carry;Stand    Examination-Participation Restrictions Interpersonal Relationship;Community Activity    Stability/Clinical Decision Making Stable/Uncomplicated    Rehab Potential Good    PT Frequency 2x / week    PT Duration 8 weeks    PT Treatment/Interventions ADLs/Self Care Home Management;Cryotherapy;Traction;Therapeutic activities;Gait training;Therapeutic exercise;Neuromuscular re-education;Patient/family education;Manual techniques;Dry needling    PT Next Visit Plan Body mechanics education, postural correction, core and leg strengthening     PT Home Exercise Plan Access Code: [email or web address]    Consulted and Agree with Plan of Care Patient           Patient will benefit from skilled therapeutic intervention in order to improve the following deficits and impairments:  Cardiopulmonary status limiting activity, Decreased activity tolerance, Decreased endurance, Decreased range of motion, Decreased strength, Difficulty walking, Hypomobility, Impaired flexibility, Increased muscle spasms,  Postural dysfunction, Improper body mechanics, Pain  Visit Diagnosis: Abnormal posture  Difficulty walking  Muscle weakness (generalized)  Chronic bilateral low back pain with right-sided sciatica     Problem List Patient Active Problem List   Diagnosis Date Noted  . 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  . Status post total replacement of left hip 03/19/2019  . Avascular necrosis of bone of hip, 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  . PTSD (post-traumatic stress disorder)   . COPD (chronic obstructive pulmonary disease) (HCC)   . Bipolar 1 disorder (HCC)   . Anxiety   . B12 deficiency 12/09/2018  . COPD with acute exacerbation (HCC) 04/14/2017  . Hyperglycemia 04/14/2017    Cherlyn Cushing PT, MPT 08/18/2020, 4:24 PM  [facility] Physical Therapy [address] [place], [place], [address] Phone: [phone]   Fax:  [phone]  Name: BAYLEI SIEBELS MRN: [id number] Date of Birth: 1951-06-12

## 2020-08-18 NOTE — Telephone Encounter (Signed)
Patient requested a return call to let her know if she is due for any fasting labs.  Patient is also scheduled for Cimzia appointment on 09/14/20.  Patient has an eye doctor appointment at 1:00 which is around the corner.  Patient will come right after appointment.    FYI:  I put in notes that patient will arrive between 2 and 3.

## 2020-08-21 LAB — HM MAMMOGRAPHY

## 2020-08-23 ENCOUNTER — Ambulatory Visit (INDEPENDENT_AMBULATORY_CARE_PROVIDER_SITE_OTHER): Payer: Medicare Other | Admitting: Rehabilitative and Restorative Service Providers"

## 2020-08-23 ENCOUNTER — Encounter: Payer: Self-pay | Admitting: Rehabilitative and Restorative Service Providers"

## 2020-08-23 ENCOUNTER — Other Ambulatory Visit: Payer: Self-pay

## 2020-08-23 DIAGNOSIS — G8929 Other chronic pain: Secondary | ICD-10-CM

## 2020-08-23 DIAGNOSIS — R293 Abnormal posture: Secondary | ICD-10-CM | POA: Diagnosis not present

## 2020-08-23 DIAGNOSIS — R262 Difficulty in walking, not elsewhere classified: Secondary | ICD-10-CM | POA: Diagnosis not present

## 2020-08-23 DIAGNOSIS — M6281 Muscle weakness (generalized): Secondary | ICD-10-CM | POA: Diagnosis not present

## 2020-08-23 DIAGNOSIS — M5441 Lumbago with sciatica, right side: Secondary | ICD-10-CM

## 2020-08-23 NOTE — Therapy (Signed)
[facility] [address] [place], [place], [address] Phone: [phone]   Fax:  [phone]  Physical Therapy Treatment/Reassessment  Patient Details  Name: Judy [name] MRN: [id number] Date of Birth: August 09, 1951 Referring Provider (PT): [name] MD   Encounter Date: 08/23/2020   PT End of Session - 08/23/20 1716    Visit Number 7    Number of Visits 16    Date for PT Re-Evaluation 09/22/20    PT Start Time 1345    PT Stop Time 1430    PT Time Calculation (min) 45 min    Activity Tolerance Patient tolerated treatment well;No increased pain    Behavior During Therapy WFL for tasks assessed/performed           Past Medical History:  Diagnosis Date  . Anxiety   . Arthritis   . Avascular necrosis (HCC)    Left hip  . Bipolar 1 disorder (HCC)   . COPD (chronic obstructive pulmonary disease) (HCC)   . Pneumonia   . PTSD (post-traumatic stress disorder)     Past Surgical History:  Procedure Laterality Date  . bone spurs foot Right   . COLONOSCOPY    . HAND SURGERY Left   . JOINT REPLACEMENT    . TONSILLECTOMY    . TONSILLECTOMY AND ADENOIDECTOMY    . TOTAL HIP ARTHROPLASTY Left 03/19/2019   Procedure: LEFT TOTAL HIP ARTHROPLASTY ANTERIOR APPROACH;  Surgeon: [name], MD;  Location: [facility];  Service: Orthopedics;  Laterality: Left;  . TOTAL HIP ARTHROPLASTY Right 06/04/2019   Procedure: RIGHT TOTAL HIP ARTHROPLASTY ANTERIOR APPROACH;  Surgeon: [name], MD;  Location: [facility];  Service: Orthopedics;  Laterality: Right;    There were no vitals filed for this visit.   Subjective Assessment - 08/23/20 1355    Subjective Balance has been better the past 5 days although she still feels as if she is biased to the left.    Pertinent History B THAs 2020 and COPD chronic    Limitations Sitting;Standing;Walking    How long can you sit comfortably? 20-30 minutes (was 20)    How long can you  stand comfortably? 10 minutes (was a few minutes)    How long can you walk comfortably? A few minutes    Currently in Pain? Yes    Pain Score 2     Pain Location Back    Pain Orientation Lower    Pain Descriptors / Indicators Tightness;Sore    Pain Type Chronic pain    Pain Radiating Towards NA    Pain Onset More than a month ago    Pain Frequency Intermittent    Aggravating Factors  Prolonged postures    Pain Relieving Factors Change position and exercises    Effect of Pain on Daily Activities Sitting at church, washer and dryer, getting things off the floor.    Multiple Pain Sites No                             [facility] Adult PT Treatment/Exercise - 08/23/20 0001      Therapeutic Activites    Therapeutic Activities Lifting    ADL's Dust pan    Lifting Golfer's and diagonal lift      Exercises   Exercises Lumbar      Lumbar Exercises: Stretches   Single Knee to Chest Stretch Left;Right;1 rep;20 seconds    Piriformis Stretch Left;Right;5 reps;20 seconds  Lumbar Exercises: Standing   Heel Raises 10 reps;3 seconds   Heel to toe with pelvic stabilization   Scapular Retraction Strengthening;10 reps   5 seconds   Other Standing Lumbar Exercises Standing trunk extension AROM 10X 3 seconds    Other Standing Lumbar Exercises Alternating hip hike with pelvic stabilization in door frame 10X 3 seconds   1 set hike only and 1 set of 5X 3 seconds with push     Lumbar Exercises: Seated   Sit to Stand 10 reps   slow eccentrics with SBP and trunk extension at top     Lumbar Exercises: Sidelying   Hip Abduction 10 reps;3 seconds;Both;Other (comment)   2 sets     Lumbar Exercises: Prone   Straight Leg Raise 10 reps;3 seconds   2 sets forehead on forearms alternating                 PT Education - 08/23/20 1712    Education Details Again reviewed HEP and body mechanics.    Person(s) Educated Patient    Methods Explanation;Demonstration;Verbal cues     Comprehension Returned demonstration;Verbalized understanding;Verbal cues required;Need further instruction               PT Long Term Goals - 08/23/20 1713      PT LONG TERM GOAL #1   Title Improve FOTO score to 64.    Baseline 40 (was 48)    Time 5    Period Weeks    Status On-going    Target Date 10/06/20      PT LONG TERM GOAL #2   Title Improve standing and walking endurance to a comfortable 15-20 minutes with appropriate modifications for COPD (pursed-lip breathing and standing breaks).    Baseline Slightly improved (COPD is most limiting).    Time 5    Period Weeks    Status On-going    Target Date 10/06/20      PT LONG TERM GOAL #3   Title Judy [name] will report low back pain consistently 0-3/10 on the Numeric Pain Rating Scale without sciatica.    Baseline 0-3/10 (was > 5/10 with sciatica).    Time 5    Period Weeks    Status Achieved    Target Date 09/22/20      PT LONG TERM GOAL #4   Title Judy [name] will be independent and compliant with her HEP at DC.    Time 5    Period Weeks    Status On-going    Target Date 10/06/20                 Plan - 08/23/20 1717    Clinical Impression Statement Judy [name] is making functional progress towards long-term goals established at evaluation.  Body mechanics and postural awareness are improving.  Pain is less severe and less frequent.  Breathing, stiffness and weakness are most functionally limiting.  With continued PT, Judy [name] should have improved function on the FOTO and be more efficient and independent with her ADLs.    Personal Factors and Comorbidities Comorbidity 1    Comorbidities COPD and B THAs    Examination-Activity Limitations Sit;Bend;Lift;Squat;Locomotion Level;Stairs;Carry;Stand    Examination-Participation Restrictions Interpersonal Relationship;Community Activity    Stability/Clinical Decision Making Stable/Uncomplicated    Rehab Potential Good    PT Frequency 2x / week    PT Duration 8 weeks    PT  Treatment/Interventions ADLs/Self Care Home Management;Cryotherapy;Traction;Therapeutic activities;Gait training;Therapeutic exercise;Neuromuscular re-education;Patient/family education;Manual techniques;Dry needling    PT Next Visit  Plan Body mechanics education, postural correction, core and leg strengthening    PT Home Exercise Plan Access Code: [email or web address]    Consulted and Agree with Plan of Care Patient           Patient will benefit from skilled therapeutic intervention in order to improve the following deficits and impairments:  Cardiopulmonary status limiting activity, Decreased activity tolerance, Decreased endurance, Decreased range of motion, Decreased strength, Difficulty walking, Hypomobility, Impaired flexibility, Increased muscle spasms, Postural dysfunction, Improper body mechanics, Pain  Visit Diagnosis: Abnormal posture  Difficulty walking  Muscle weakness (generalized)  Chronic bilateral low back pain with right-sided sciatica     Problem List Patient Active Problem List   Diagnosis Date Noted  . 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  . Status post total replacement of left hip 03/19/2019  . Avascular necrosis of bone of hip, 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  . PTSD (post-traumatic stress disorder)   . COPD (chronic obstructive pulmonary disease) (HCC)   . Bipolar 1 disorder (HCC)   . Anxiety   . B12 deficiency 12/09/2018  . COPD with acute exacerbation (HCC) 04/14/2017  . Hyperglycemia 04/14/2017    Cherlyn Cushing PT, MPT 08/23/2020, 5:20 PM  [facility] [address] [place], [place], [address] Phone: [phone]   Fax:  [phone]  Name: Judy [name] MRN: [id number] Date of Birth: 30-Apr-1951

## 2020-08-23 NOTE — Progress Notes (Signed)
Office Visit Note  [name]             Date of Birth: 13-Oct-1950           MRN: [id number]             PCP: Wynn Banker, MD Referring: Wynn Banker, MD Visit Date: 09/05/2020 Occupation: @GUAROCC @  Subjective:  Medication management   History of Present Illness: Judy [name] is a 69 y.o. female with history of ankylosing spondylitis.  She completed Cimzia loading dose on June 22, 2020.  She states she has been experiencing some lower back pain and has been going to physical therapy.  The pain has been radiating to the left side.  She states that she has been also having some episodes of dizziness while she turns in the bed and also sits up from the lying down position.  She denies any other joint pain or joint swelling currently.  Activities of Daily Living:  Patient reports morning stiffness for 20  minutes.   Patient Denies nocturnal pain.  Difficulty dressing/grooming: Reports Difficulty climbing stairs: Reports Difficulty getting out of chair: Reports Difficulty using hands for taps, buttons, cutlery, and/or writing: Denies  Review of Systems  Constitutional: Negative for fatigue, night sweats, weight gain and weight loss.  HENT: Negative for mouth sores, nosebleeds, trouble swallowing, trouble swallowing, mouth dryness and nose dryness.   Eyes: Negative for pain, redness, itching, visual disturbance and dryness.       Watery eyes.  She has appointment coming up with the ophthalmologist.  Respiratory: Positive for shortness of breath. Negative for cough and difficulty breathing.        History of COPD  Cardiovascular: Negative for chest pain, palpitations, hypertension, irregular heartbeat and swelling in legs/feet.  Gastrointestinal: Negative for blood in stool, constipation and diarrhea.  Endocrine: Negative for increased urination.  Genitourinary: Negative for difficulty urinating and vaginal dryness.  Musculoskeletal:  Positive for arthralgias, joint pain, myalgias, morning stiffness, muscle tenderness and myalgias. Negative for joint swelling.  Skin: Negative for color change, rash, hair loss, redness, skin tightness, ulcers and sensitivity to sunlight.  Allergic/Immunologic: Negative for susceptible to infections.  Neurological: Positive for dizziness and numbness. Negative for headaches, memory loss, night sweats and weakness.  Hematological: Negative for bruising/bleeding tendency and swollen glands.  Psychiatric/Behavioral: Positive for depressed mood. Negative for confusion and sleep disturbance. The patient is not nervous/anxious.     PMFS History:  Patient Active Problem List   Diagnosis Date Noted  . 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  . Status post total replacement of left hip 03/19/2019  . Avascular necrosis of bone of hip, 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  . PTSD (post-traumatic stress disorder)   . COPD (chronic obstructive pulmonary disease) (HCC)   . Bipolar 1 disorder (HCC)   . Anxiety   . B12 deficiency 12/09/2018  . COPD with acute exacerbation (HCC) 04/14/2017  . Hyperglycemia 04/14/2017    Past Medical History:  Diagnosis Date  . Anxiety   . Arthritis   . Avascular necrosis (HCC)    Left hip  . Bipolar 1 disorder (HCC)   . COPD (chronic obstructive pulmonary disease) (HCC)   . Pneumonia   . PTSD (post-traumatic stress disorder)     Family History  Problem Relation Age of Onset  . CAD Mother 79  . COPD Mother   . Depression Mother   .  Heart attack Father   . Colon polyps Sister   . Heart attack Maternal Grandmother   . Early death Maternal Grandfather   . Hearing loss Paternal Grandfather   . Bipolar disorder Son   . Asthma Daughter    Past Surgical History:  Procedure Laterality Date  . bone spurs foot Right   . COLONOSCOPY    . HAND SURGERY Left   . JOINT REPLACEMENT    . TONSILLECTOMY      . TONSILLECTOMY AND ADENOIDECTOMY    . TOTAL HIP ARTHROPLASTY Left 03/19/2019   Procedure: LEFT TOTAL HIP ARTHROPLASTY ANTERIOR APPROACH;  Surgeon: [name], MD;  Location: [facility];  Service: Orthopedics;  Laterality: Left;  . TOTAL HIP ARTHROPLASTY Right 06/04/2019   Procedure: RIGHT TOTAL HIP ARTHROPLASTY ANTERIOR APPROACH;  Surgeon: [name], MD;  Location: [facility];  Service: Orthopedics;  Laterality: Right;   Social History   Social History Narrative  . Not on file   Immunization History  Administered Date(s) Administered  . Influenza, High Dose Seasonal PF 08/25/2018  . Moderna SARS-COVID-2 Vaccination 11/08/2019, 12/05/2019  . Pneumococcal Conjugate-13 09/23/2018  . Pneumococcal Polysaccharide-23 08/27/2016  . Tdap 10/07/2012  . Yellow Fever 07/05/2008     Objective: Vital Signs: BP 122/82 (BP Location: Left Arm, Patient Position: Sitting, Cuff Size: Normal)   Pulse 73   Resp 17   Ht 5\' 10"  (1.778 m)   Wt 209 lb (94.8 kg)   BMI 29.99 kg/m    Physical Exam Vitals and nursing note reviewed.  Constitutional:      Appearance: She is well-developed.  HENT:     Head: Normocephalic and atraumatic.  Eyes:     Conjunctiva/sclera: Conjunctivae normal.  Cardiovascular:     Rate and Rhythm: Normal rate and regular rhythm.     Heart sounds: Normal heart sounds.  Pulmonary:     Effort: Pulmonary effort is normal.     Breath sounds: Normal breath sounds.  Abdominal:     General: Bowel sounds are normal.     Palpations: Abdomen is soft.  Musculoskeletal:     Cervical back: Normal range of motion.  Lymphadenopathy:     Cervical: No cervical adenopathy.  Skin:    General: Skin is warm and dry.     Capillary Refill: Capillary refill takes less than 2 seconds.  Neurological:     Mental Status: She is alert and oriented to person, place, and time.  Psychiatric:        Behavior: Behavior normal.      Musculoskeletal Exam: She has limited range  of motion of her cervical thoracic and lumbar spine and painful range of motion of her lumbar spine.  She had no SI joint tenderness on examination.  She had good range of motion of her shoulder joints.  She had bilateral flexion contracture in her elbows.  Wrist joints, MCPs and PIPs with good range of motion.  PIP and DIP thickening was noted with no synovitis.  She had good range of motion of her hip joints and knee joints.  There was no tenderness over ankles or MTPs.  There was no plantar fasciitis or Achilles tendinitis.  CDAI Exam: CDAI Score: -- Patient Global: --; Provider Global: -- Swollen: --; Tender: -- Joint Exam 09/05/2020   No joint exam has been documented for this visit   There is currently no information documented on the homunculus. Go to the Rheumatology activity and complete the homunculus joint exam.  Investigation: No  additional findings.  Imaging: No results found.  Recent Labs: Lab Results  Component Value Date   WBC 4.8 06/22/2020   HGB 13.8 06/22/2020   PLT 233 06/22/2020   NA 140 06/22/2020   K 4.0 06/22/2020   CL 104 06/22/2020   CO2 25 06/22/2020   GLUCOSE 159 (H) 06/22/2020   BUN 9 06/22/2020   CREATININE 0.99 06/22/2020   BILITOT 0.4 06/22/2020   ALKPHOS 139 (H) 12/11/2018   AST 15 06/22/2020   ALT 18 06/22/2020   PROT 6.3 06/22/2020   ALBUMIN 3.8 12/11/2018   CALCIUM 9.9 06/22/2020   GFRAA 68 06/22/2020   QFTBGOLDPLUS NEGATIVE 04/26/2020    Speciality Comments: No specialty comments available.  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  Although she recently sprained her lower back and has been having increased discomfort.  She has been going to physical therapy.  She has been off steroids.   She has been on Cimzia injections in the office every 4 weeks.  High risk medication use - Cimzia 400 mg sq injections every 4 weeks-Administered in the office. - Plan: CBC with Differential/Platelet, COMPLETE METABOLIC PANEL WITH GFR today and then every 3 months to monitor for drug toxicity.  DDD (degenerative disc disease), lumbar-she sprained her lower back and has been having increased discomfort.  She is going to physical therapy.  Spinal stenosis of lumbosacral region  Chronic SI joint pain-she had no SI joint tenderness on my examination today.  Status post total replacement of left hip-she had good range of motion.  Avascular necrosis of bone of right hip (HCC) - h/o  Avascular necrosis of bone of hip, left (HCC) - h/o  Status post total replacement of right hip-she had good range of motion.  Contracture of joint of both elbows-chronic, unchanged.  Dizziness-patient is experiencing dizziness when she is rolling in the bed and also sitting up from the lying down position.  She will contact her PCPs office for evaluation.  Bipolar 1 disorder (HCC)  Pulmonary nodules - Followed by with Dr. Isaiah Serge  History of COPD  PTSD (post-traumatic stress disorder)  B12 deficiency  Former smoker - quit smoking 05/19/2020, smoked 1/2 ppd x 50 years  Educated about COVID-19 virus infection-she has had both Covid vaccines.  She was advised to get a booster.  Use of mask, social distancing and hand hygiene was discussed.  Instructions were placed in the AVS.  Orders: Orders Placed This Encounter  Procedures  . CBC with Differential/Platelet  . COMPLETE METABOLIC PANEL WITH GFR   No orders of the defined types were placed in this encounter.    Follow-Up Instructions: Return in about 3 months (around 12/04/2020) for Ankylosing spondylitis.   Pollyann Savoy, MD  Note - This record has been created using Animal nutritionist.  Chart creation errors have been sought, but may not always   have been located. Such creation errors do not reflect on  the standard of medical care.

## 2020-08-25 ENCOUNTER — Encounter: Payer: Self-pay | Admitting: Rehabilitative and Restorative Service Providers"

## 2020-08-25 ENCOUNTER — Ambulatory Visit (INDEPENDENT_AMBULATORY_CARE_PROVIDER_SITE_OTHER): Payer: Medicare Other | Admitting: Rehabilitative and Restorative Service Providers"

## 2020-08-25 ENCOUNTER — Other Ambulatory Visit: Payer: Self-pay

## 2020-08-25 DIAGNOSIS — R262 Difficulty in walking, not elsewhere classified: Secondary | ICD-10-CM

## 2020-08-25 DIAGNOSIS — M5441 Lumbago with sciatica, right side: Secondary | ICD-10-CM

## 2020-08-25 DIAGNOSIS — G8929 Other chronic pain: Secondary | ICD-10-CM

## 2020-08-25 DIAGNOSIS — M6281 Muscle weakness (generalized): Secondary | ICD-10-CM | POA: Diagnosis not present

## 2020-08-25 DIAGNOSIS — R293 Abnormal posture: Secondary | ICD-10-CM | POA: Diagnosis not present

## 2020-08-25 NOTE — Therapy (Signed)
[facility] [address] [place], [place], [address] Phone: [phone]   Fax:  [phone]  Physical Therapy Treatment  Patient Details  Name: Judy [name] MRN: [id number] Date of Birth: 27-Jan-1951 Referring Provider (PT): [name] MD   Encounter Date: 08/25/2020   PT End of Session - 08/25/20 1434    Visit Number 8    Number of Visits 16    Date for PT Re-Evaluation 09/22/20    PT Start Time 1301    PT Stop Time 1342    PT Time Calculation (min) 41 min    Activity Tolerance Patient tolerated treatment well;No increased pain    Behavior During Therapy WFL for tasks assessed/performed           Past Medical History:  Diagnosis Date  . Anxiety   . Arthritis   . Avascular necrosis (HCC)    Left hip  . Bipolar 1 disorder (HCC)   . COPD (chronic obstructive pulmonary disease) (HCC)   . Pneumonia   . PTSD (post-traumatic stress disorder)     Past Surgical History:  Procedure Laterality Date  . bone spurs foot Right   . COLONOSCOPY    . HAND SURGERY Left   . JOINT REPLACEMENT    . TONSILLECTOMY    . TONSILLECTOMY AND ADENOIDECTOMY    . TOTAL HIP ARTHROPLASTY Left 03/19/2019   Procedure: LEFT TOTAL HIP ARTHROPLASTY ANTERIOR APPROACH;  Surgeon: [name], MD;  Location: [facility];  Service: Orthopedics;  Laterality: Left;  . TOTAL HIP ARTHROPLASTY Right 06/04/2019   Procedure: RIGHT TOTAL HIP ARTHROPLASTY ANTERIOR APPROACH;  Surgeon: [name], MD;  Location: [facility];  Service: Orthopedics;  Laterality: Right;    There were no vitals filed for this visit.   Subjective Assessment - 08/25/20 1348    Subjective [name] notes no sciatica and 0-2/10 back pain this week.    Pertinent History B THAs 2020 and COPD chronic    Limitations Sitting;Standing;Walking    How long can you sit comfortably? 20-30 minutes (was 20)    How long can you stand comfortably? 10 minutes (was a few minutes)     How long can you walk comfortably? A few minutes    Currently in Pain? No/denies    Pain Onset More than a month ago    Aggravating Factors  Prolonged postures    Pain Relieving Factors Change of position and exercises    Effect of Pain on Daily Activities Sitting on a hard chair (church), washer and dryer, dust pan and getting things off the floor.    Multiple Pain Sites No                             [facility] Adult PT Treatment/Exercise - 08/25/20 0001      Neuro Re-ed    Neuro Re-ed Details  Heel to toe balance 4X each  20 seconds eyes open and closed      Exercises   Exercises Lumbar      Lumbar Exercises: Stretches   Single Knee to Chest Stretch Left;Right;1 rep;20 seconds    Piriformis Stretch Left;Right;5 reps;20 seconds      Lumbar Exercises: Standing   Heel Raises --   Optional   Scapular Retraction Strengthening;10 reps   5 seconds   Other Standing Lumbar Exercises Standing trunk extension AROM 10X 3 seconds    Other Standing Lumbar Exercises Alternating hip hike with pelvic  stabilization in door frame 10X 3 seconds   1 set hike only and 1 set of 5X 3 seconds with push     Lumbar Exercises: Seated   Sit to Stand 10 reps   slow eccentrics with SBP and trunk extension at top   Other Seated Lumbar Exercises Cervical ratation AROM standing 10X 3 seconds      Lumbar Exercises: Sidelying   Hip Abduction 10 reps;3 seconds;Both;Other (comment)   2 sets     Lumbar Exercises: Prone   Straight Leg Raise 10 reps;3 seconds   2 sets forehead on forearms alternating                 PT Education - 08/25/20 1433    Education Details Reviewed HEP and added a balance and cervical AROM activity per [name]'s request.    Person(s) Educated Patient    Methods Explanation;Demonstration;Verbal cues;Handout    Comprehension Verbal cues required;Returned demonstration;Need further instruction;Verbalized understanding               PT Long Term Goals -  08/25/20 1434      PT LONG TERM GOAL #1   Title Improve FOTO score to 64.    Baseline 40 (was 48)    Time 5    Period Weeks    Status On-going      PT LONG TERM GOAL #2   Title Improve standing and walking endurance to a comfortable 15-20 minutes with appropriate modifications for COPD (pursed-lip breathing and standing breaks).    Baseline Slightly improved (COPD is most limiting).    Time 5    Period Weeks    Status On-going      PT LONG TERM GOAL #3   Title [name] will report low back pain consistently 0-3/10 on the Numeric Pain Rating Scale without sciatica.    Baseline 0-3/10 (was > 5/10 with sciatica).    Time 5    Period Weeks    Status Achieved      PT LONG TERM GOAL #4   Title [name] will be independent and compliant with her HEP at DC.    Time 5    Period Weeks    Status On-going                 Plan - 08/25/20 1434    Clinical Impression Statement [name] is happy with her pain progress with her physical therapy.  Sciatica has been absent for over a week and back pain has been no higher than a 3/10 for several days.  We will review her HEP and activities added today on her next visit for potential transfer into independent rehabilitation.    Personal Factors and Comorbidities Comorbidity 1    Comorbidities COPD and B THAs    Examination-Activity Limitations Sit;Bend;Lift;Squat;Locomotion Level;Stairs;Carry;Stand    Examination-Participation Restrictions Interpersonal Relationship;Community Activity    Stability/Clinical Decision Making Stable/Uncomplicated    Rehab Potential Good    PT Frequency 2x / week    PT Duration 8 weeks    PT Treatment/Interventions ADLs/Self Care Home Management;Cryotherapy;Traction;Therapeutic activities;Gait training;Therapeutic exercise;Neuromuscular re-education;Patient/family education;Manual techniques;Dry needling    PT Next Visit Plan Body mechanics education, postural correction, core and leg strengthening    PT Home Exercise  Plan Access Code: [email or web address]    Consulted and Agree with Plan of Care Patient           Patient will benefit from skilled therapeutic intervention in order to improve the following deficits and impairments:  Cardiopulmonary status  limiting activity, Decreased activity tolerance, Decreased endurance, Decreased range of motion, Decreased strength, Difficulty walking, Hypomobility, Impaired flexibility, Increased muscle spasms, Postural dysfunction, Improper body mechanics, Pain  Visit Diagnosis: Abnormal posture  Difficulty walking  Muscle weakness (generalized)  Chronic bilateral low back pain with right-sided sciatica     Problem List Patient Active Problem List   Diagnosis Date Noted  . 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  . Status post total replacement of left hip 03/19/2019  . Avascular necrosis of bone of hip, 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  . PTSD (post-traumatic stress disorder)   . COPD (chronic obstructive pulmonary disease) (HCC)   . Bipolar 1 disorder (HCC)   . Anxiety   . B12 deficiency 12/09/2018  . COPD with acute exacerbation (HCC) 04/14/2017  . Hyperglycemia 04/14/2017    Cherlyn Cushing PT, MPT 08/25/2020, 2:38 PM  [facility] [address] [place], [place], [address] Phone: [phone]   Fax:  [phone]  Name: KIERRAH KILBRIDE MRN: [id number] Date of Birth: 24-Oct-1950

## 2020-08-28 ENCOUNTER — Encounter: Payer: Self-pay | Admitting: Orthopaedic Surgery

## 2020-08-28 ENCOUNTER — Ambulatory Visit (INDEPENDENT_AMBULATORY_CARE_PROVIDER_SITE_OTHER): Payer: Medicare Other | Admitting: Orthopaedic Surgery

## 2020-08-28 DIAGNOSIS — Z96642 Presence of left artificial hip joint: Secondary | ICD-10-CM | POA: Diagnosis not present

## 2020-08-28 DIAGNOSIS — Z96641 Presence of right artificial hip joint: Secondary | ICD-10-CM | POA: Diagnosis not present

## 2020-08-28 DIAGNOSIS — M545 Low back pain, unspecified: Secondary | ICD-10-CM | POA: Diagnosis not present

## 2020-08-28 NOTE — Progress Notes (Signed)
The patient comes in today having dealt with physical therapy for her lumbar spine and radicular symptoms.  She still has occasional numbness and tingling in around her right thigh but overall reports that she is doing well.  She also is going to a gym on her own with a home exercise program.  On exam today both her total hips that we did in 2020 are moving well and smoothly.  She has negative straight leg raise bilaterally and excellent strength in bilateral lower extremities with normal sensation.  At this point she is doing well enough that we can release her to follow-up as needed.  If things worsen anyway or change she knows to let us know.

## 2020-08-29 ENCOUNTER — Encounter: Payer: Self-pay | Admitting: Physical Therapy

## 2020-08-29 ENCOUNTER — Ambulatory Visit (INDEPENDENT_AMBULATORY_CARE_PROVIDER_SITE_OTHER): Payer: Medicare Other | Admitting: Physical Therapy

## 2020-08-29 ENCOUNTER — Encounter: Payer: Self-pay | Admitting: Family Medicine

## 2020-08-29 ENCOUNTER — Other Ambulatory Visit: Payer: Self-pay

## 2020-08-29 DIAGNOSIS — R293 Abnormal posture: Secondary | ICD-10-CM

## 2020-08-29 DIAGNOSIS — G8929 Other chronic pain: Secondary | ICD-10-CM

## 2020-08-29 DIAGNOSIS — M6281 Muscle weakness (generalized): Secondary | ICD-10-CM

## 2020-08-29 DIAGNOSIS — R262 Difficulty in walking, not elsewhere classified: Secondary | ICD-10-CM | POA: Diagnosis not present

## 2020-08-29 DIAGNOSIS — M5441 Lumbago with sciatica, right side: Secondary | ICD-10-CM

## 2020-08-29 NOTE — Therapy (Signed)
[facility] [address] [place], [place], [address] Phone: [phone]   Fax:  [phone]  Physical Therapy Treatment  Patient Details  Name: Judy [name] MRN: [id number] Date of Birth: [birth year] Referring Provider (PT): [name] MD   Encounter Date: 08/29/2020   PT End of Session - 08/29/20 1317    Visit Number 9    Number of Visits 16    Date for PT Re-Evaluation 09/22/20    PT Start Time 1302    PT Stop Time 1342    PT Time Calculation (min) 40 min    Activity Tolerance Patient tolerated treatment well;No increased pain    Behavior During Therapy WFL for tasks assessed/performed           Past Medical History:  Diagnosis Date  . Anxiety   . Arthritis   . Avascular necrosis (HCC)    Left hip  . Bipolar 1 disorder (HCC)   . COPD (chronic obstructive pulmonary disease) (HCC)   . Pneumonia   . PTSD (post-traumatic stress disorder)     Past Surgical History:  Procedure Laterality Date  . bone spurs foot Right   . COLONOSCOPY    . HAND SURGERY Left   . JOINT REPLACEMENT    . TONSILLECTOMY    . TONSILLECTOMY AND ADENOIDECTOMY    . TOTAL HIP ARTHROPLASTY Left 03/19/2019   Procedure: LEFT TOTAL HIP ARTHROPLASTY ANTERIOR APPROACH;  Surgeon: [name], MD;  Location: [facility];  Service: Orthopedics;  Laterality: Left;  . TOTAL HIP ARTHROPLASTY Right 06/04/2019   Procedure: RIGHT TOTAL HIP ARTHROPLASTY ANTERIOR APPROACH;  Surgeon: [name], MD;  Location: [facility];  Service: Orthopedics;  Laterality: Right;    There were no vitals filed for this visit.   Subjective Assessment - 08/29/20 1309    Subjective Pt arriving reporting 2/10 pain in back pain. Pt feels she has improved with overall status.    Pertinent History B THAs 2020 and COPD chronic    Limitations Sitting;Standing;Walking    How long can you sit comfortably? 20-30 minutes (was 20)    How long can you stand comfortably?  10 minutes (was a few minutes)    How long can you walk comfortably? A few minutes    Currently in Pain? No/denies    Pain Score 2     Pain Location Back    Pain Orientation Lower    Pain Descriptors / Indicators Aching;Sore    Pain Type Chronic pain    Pain Onset More than a month ago                             [facility] Adult PT Treatment/Exercise - 08/29/20 0001      Lumbar Exercises: Standing   Scapular Retraction Strengthening;10 reps   5 seconds   Row Strengthening;20 reps;Theraband    Theraband Level (Row) Level 3 (Green)    Other Standing Lumbar Exercises Standing trunk extension AROM 10X 3 seconds    Other Standing Lumbar Exercises standing hip abduction x 20 reps UE support      Lumbar Exercises: Seated   Sit to Stand 10 reps   slow eccentrics with SBP and trunk extension at top     Lumbar Exercises: Sidelying   Hip Abduction --   2 sets     Lumbar Exercises: Prone   Straight Leg Raise 10 reps;3 seconds   2 sets forehead on forearms alternating  PT Long Term Goals - 08/25/20 1434      PT LONG TERM GOAL #1   Title Improve FOTO score to 64.    Baseline 40 (was 48)    Time 5    Period Weeks    Status On-going      PT LONG TERM GOAL #2   Title Improve standing and walking endurance to a comfortable 15-20 minutes with appropriate modifications for COPD (pursed-lip breathing and standing breaks).    Baseline Slightly improved (COPD is most limiting).    Time 5    Period Weeks    Status On-going      PT LONG TERM GOAL #3   Title [name] will report low back pain consistently 0-3/10 on the Numeric Pain Rating Scale without sciatica.    Baseline 0-3/10 (was > 5/10 with sciatica).    Time 5    Period Weeks    Status Achieved      PT LONG TERM GOAL #4   Title [name] will be independent and compliant with her HEP at DC.    Time 5    Period Weeks    Status On-going                 Plan - 08/29/20 1321     Clinical Impression Statement Pt tolerating exericses well today. Pt experiencing mild SOB. Pt's oxygensaturation levels were 98% on room air. Pt reported she wants to try holding therapy for a week to try going to the gym 3 days a week to see how her body responds. Will follow up after one week and assess goals and furhter treatment plan.    Personal Factors and Comorbidities Comorbidity 1    Comorbidities COPD and B THAs    Examination-Activity Limitations Sit;Bend;Lift;Squat;Locomotion Level;Stairs;Carry;Stand    Examination-Participation Restrictions Interpersonal Relationship;Community Activity    Stability/Clinical Decision Making Stable/Uncomplicated    Rehab Potential Good    PT Frequency 2x / week    PT Duration 8 weeks    PT Treatment/Interventions ADLs/Self Care Home Management;Cryotherapy;Traction;Therapeutic activities;Gait training;Therapeutic exercise;Neuromuscular re-education;Patient/family education;Manual techniques;Dry needling    PT Next Visit Plan Body mechanics education, postural correction, core and leg strengthening    PT Home Exercise Plan Access Code: [email or web address]    Consulted and Agree with Plan of Care Patient           Patient will benefit from skilled therapeutic intervention in order to improve the following deficits and impairments:  Cardiopulmonary status limiting activity, Decreased activity tolerance, Decreased endurance, Decreased range of motion, Decreased strength, Difficulty walking, Hypomobility, Impaired flexibility, Increased muscle spasms, Postural dysfunction, Improper body mechanics, Pain  Visit Diagnosis: Abnormal posture  Difficulty walking  Muscle weakness (generalized)  Chronic bilateral low back pain with right-sided sciatica  Chronic left-sided low back pain with right-sided sciatica     Problem List Patient Active Problem List   Diagnosis Date Noted  . 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 Avascular necrosis of  bone of right hip (HCC) 04/28/2019  . Status post total replacement of left hip 03/19/2019  . Avascular necrosis of bone of hip, 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  . PTSD (post-traumatic stress disorder)   . COPD (chronic obstructive pulmonary disease) (HCC)   . Bipolar 1 disorder (HCC)   . Anxiety   . B12 deficiency 12/09/2018  . COPD with acute exacerbation (HCC) 04/14/2017  . Hyperglycemia 04/14/2017    Sharmon Leyden, PT, MPT 08/29/2020, 1:35 PM  [facility]  [address] [place], [place], [address] Phone: [phone]   Fax:  [phone]  Name: Judy [name] MRN: [id number] Date of Birth: 13-Nov-1950

## 2020-09-05 ENCOUNTER — Encounter: Payer: Self-pay | Admitting: Rheumatology

## 2020-09-05 ENCOUNTER — Other Ambulatory Visit: Payer: Self-pay

## 2020-09-05 ENCOUNTER — Ambulatory Visit (INDEPENDENT_AMBULATORY_CARE_PROVIDER_SITE_OTHER): Payer: Medicare Other | Admitting: Rheumatology

## 2020-09-05 VITALS — BP 122/82 | HR 73 | Resp 17 | Ht 70.0 in | Wt 209.0 lb

## 2020-09-05 DIAGNOSIS — M24522 Contracture, left elbow: Secondary | ICD-10-CM

## 2020-09-05 DIAGNOSIS — Z79899 Other long term (current) drug therapy: Secondary | ICD-10-CM

## 2020-09-05 DIAGNOSIS — M45 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: Secondary | ICD-10-CM

## 2020-09-05 DIAGNOSIS — Z8709 Personal history of other diseases of the respiratory system: Secondary | ICD-10-CM

## 2020-09-05 DIAGNOSIS — R918 Other nonspecific abnormal finding of lung field: Secondary | ICD-10-CM

## 2020-09-05 DIAGNOSIS — Z96642 Presence of left artificial hip joint: Secondary | ICD-10-CM

## 2020-09-05 DIAGNOSIS — F4310 Post-traumatic stress disorder, unspecified: Secondary | ICD-10-CM

## 2020-09-05 DIAGNOSIS — Z96641 Presence of right artificial hip joint: Secondary | ICD-10-CM

## 2020-09-05 DIAGNOSIS — F17200 Nicotine dependence, unspecified, uncomplicated: Secondary | ICD-10-CM

## 2020-09-05 DIAGNOSIS — M5136 Other intervertebral disc degeneration, lumbar region: Secondary | ICD-10-CM | POA: Diagnosis not present

## 2020-09-05 DIAGNOSIS — M24521 Contracture, right elbow: Secondary | ICD-10-CM

## 2020-09-05 DIAGNOSIS — M87051 Idiopathic aseptic necrosis of right femur: Secondary | ICD-10-CM

## 2020-09-05 DIAGNOSIS — Z7189 Other specified counseling: Secondary | ICD-10-CM

## 2020-09-05 DIAGNOSIS — G8929 Other chronic pain: Secondary | ICD-10-CM

## 2020-09-05 DIAGNOSIS — M4807 Spinal stenosis, lumbosacral region: Secondary | ICD-10-CM | POA: Diagnosis not present

## 2020-09-05 DIAGNOSIS — M533 Sacrococcygeal disorders, not elsewhere classified: Secondary | ICD-10-CM

## 2020-09-05 DIAGNOSIS — R42 Dizziness and giddiness: Secondary | ICD-10-CM

## 2020-09-05 DIAGNOSIS — M51369 Other intervertebral disc degeneration, lumbar region without mention of lumbar back pain or lower extremity pain: Secondary | ICD-10-CM

## 2020-09-05 DIAGNOSIS — Z87891 Personal history of nicotine dependence: Secondary | ICD-10-CM

## 2020-09-05 DIAGNOSIS — M87052 Idiopathic aseptic necrosis of left femur: Secondary | ICD-10-CM

## 2020-09-05 DIAGNOSIS — E538 Deficiency of other specified B group vitamins: Secondary | ICD-10-CM

## 2020-09-05 DIAGNOSIS — F319 Bipolar disorder, unspecified: Secondary | ICD-10-CM

## 2020-09-05 NOTE — Patient Instructions (Signed)
Standing Labs We placed an order today for your standing lab work.   Please have your standing labs drawn in March and every 3 months  If possible, please have your labs drawn 2 weeks prior to your appointment so that the provider can discuss your results at your appointment.  We have open lab daily Monday through Thursday from 8:30-12:30 PM and 1:30-4:30 PM and Friday from 8:30-12:30 PM and 1:30-4:00 PM at the office of Dr. Latifah Padin, [facility].   Please be advised, patients with office appointments requiring lab work will take precedents over walk-in lab work.  If possible, please come for your lab work on Monday and Friday afternoons, as you may experience shorter wait times. The office is located at [address], [address], [place], [place] [address] No appointment is necessary.   Labs are drawn by [facility]. Please bring your co-pay at the time of your lab draw.  You may receive a bill from [facility] for your lab work.  If you wish to have your labs drawn at another location, please call the office 24 hours in advance to send orders.  If you have any questions regarding directions or hours of operation,  please call [phone].   As a reminder, please drink plenty of water prior to coming for your lab work. Thanks!   COVID-19 vaccine recommendations:   COVID-19 vaccine is recommended for everyone (unless you are allergic to a vaccine component), even if you are on a medication that suppresses your immune system.    Do not take Tylenol or any anti-inflammatory medications (NSAIDs) 24 hours prior to the COVID-19 vaccination.   There is no direct evidence about the efficacy of the COVID-19 vaccine in individuals who are on medications that suppress the immune system.   Even if you are fully vaccinated, and you are on any medications that suppress your immune system, please continue to wear a mask, maintain at least six feet social distance and practice hand  hygiene.   If you develop a COVID-19 infection, please contact your PCP or our office to determine if you need monoclonal antibody infusion.  The booster vaccine is now available for immunocompromised patients.   Please see the following web sites for updated information.   [email or web address]-Patient-[email or web address]    

## 2020-09-06 ENCOUNTER — Ambulatory Visit (INDEPENDENT_AMBULATORY_CARE_PROVIDER_SITE_OTHER): Payer: Medicare Other | Admitting: Pulmonary Disease

## 2020-09-06 ENCOUNTER — Encounter: Payer: Self-pay | Admitting: Pulmonary Disease

## 2020-09-06 ENCOUNTER — Other Ambulatory Visit: Payer: Self-pay

## 2020-09-06 VITALS — BP 130/66 | HR 79 | Temp 98.1°F | Ht 70.0 in | Wt 207.6 lb

## 2020-09-06 DIAGNOSIS — Z720 Tobacco use: Secondary | ICD-10-CM | POA: Diagnosis not present

## 2020-09-06 DIAGNOSIS — J449 Chronic obstructive pulmonary disease, unspecified: Secondary | ICD-10-CM

## 2020-09-06 LAB — COMPLETE METABOLIC PANEL WITH GFR
AG Ratio: 1.7 (calc) (ref 1.0–2.5)
ALT: 16 U/L (ref 6–29)
AST: 13 U/L (ref 10–35)
Albumin: 4 g/dL (ref 3.6–5.1)
Alkaline phosphatase (APISO): 136 U/L (ref 37–153)
BUN: 11 mg/dL (ref 7–25)
CO2: 26 mmol/L (ref 20–32)
Calcium: 9.6 mg/dL (ref 8.6–10.4)
Chloride: 107 mmol/L (ref 98–110)
Creat: 0.93 mg/dL (ref 0.50–0.99)
GFR, Est African American: 73 mL/min/{1.73_m2} (ref >=60)
GFR, Est Non African American: 63 mL/min/{1.73_m2} (ref >=60)
Globulin: 2.3 g/dL (calc) (ref 1.9–3.7)
Glucose, Bld: 92 mg/dL (ref 65–139)
Potassium: 4.6 mmol/L (ref 3.5–5.3)
Sodium: 141 mmol/L (ref 135–146)
Total Bilirubin: 0.3 mg/dL (ref 0.2–1.2)
Total Protein: 6.3 g/dL (ref 6.1–8.1)

## 2020-09-06 LAB — CBC WITH DIFFERENTIAL/PLATELET
Absolute Monocytes: 479 cells/uL (ref 200–950)
Basophils Absolute: 22 cells/uL (ref 0–200)
Basophils Relative: 0.4 %
Eosinophils Absolute: 99 cells/uL (ref 15–500)
Eosinophils Relative: 1.8 %
HCT: 37.2 % (ref 35.0–45.0)
Hemoglobin: 12.5 g/dL (ref 11.7–15.5)
Lymphs Abs: 1612 cells/uL (ref 850–3900)
MCH: 30.8 pg (ref 27.0–33.0)
MCHC: 33.6 g/dL (ref 32.0–36.0)
MCV: 91.6 fL (ref 80.0–100.0)
MPV: 9.9 fL (ref 7.5–12.5)
Monocytes Relative: 8.7 %
Neutro Abs: 3289 cells/uL (ref 1500–7800)
Neutrophils Relative %: 59.8 %
Platelets: 226 10*3/uL (ref 140–400)
RBC: 4.06 10*6/uL (ref 3.80–5.10)
RDW: 12.5 % (ref 11.0–15.0)
Total Lymphocyte: 29.3 %
WBC: 5.5 10*3/uL (ref 3.8–10.8)

## 2020-09-06 NOTE — Addendum Note (Signed)
Addended by: Jacquiline Doe on: 09/06/2020 12:05 PM   Modules accepted: Orders

## 2020-09-06 NOTE — Progress Notes (Signed)
CBC and CMP are normal.

## 2020-09-06 NOTE — Patient Instructions (Signed)
We will refer you for screening CTs of the chest Continue the inhalers Follow-up in 6 months.

## 2020-09-06 NOTE — Progress Notes (Signed)
Judy [name]    [id number]    18-Nov-1950  Primary Care Physician:Koberlein, Steele Berg, MD  Referring Physician: Caren Macadam, MD [place],  [place] [address]  Chief complaint: Follow up for COPD  HPI: 69 year old with anxiety, PTSD, bipolar, COPD, asthma  Complains of dyspnea on exertion for the past 1 year.  She has symptoms on exertion, mild symptoms at rest, daily cough with clear mucus, sinus drainage.  She has had multiple hospitalizations over the past year for COPD exacerbations.  Given inhalers including Advair and Symbicort but does not feel it is helping.  Previously evaluated by pulmonary at [facility] and is here for second opinion.  Hospitalized in July 2018 for COPD exacerbation Hospitalized in November 2018 for COPD exacerbation, CT during both admissions were is negative for PE but showed bilateral groundglass opacities.  She was treated with antibiotics, oral prednisone.  Virus panel was positive for adenovirus at that time.  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 Smoking history: 50-pack-year smoker.  Continues to smoke 1 pack/day Travel history: Originally  from [place].  No significant recent travel Relevant family history: Daughter has asthma.  No other significant family history of lung disease.  Interim history: Continues on Dulera inhaler.  She had tried a variety of other inhalers including Trelegy, Stiolto which did not make any difference with the breathing.  They were also expensive costing upwards of $300 so she is back on Dulera  Quit smoking 3 months ago and notes improvement in wheezing. Has chronic dyspnea on exertion.  Outpatient Encounter Medications as of 09/06/2020  Medication Sig  . albuterol (VENTOLIN HFA) 108 (90 Base) MCG/ACT inhaler Inhale 1-2 puffs into the  lungs every 6 (six) hours as needed for wheezing or shortness of breath.  . cariprazine (VRAYLAR) capsule Take 6 mg by mouth daily.   . Certolizumab Pegol (CIMZIA PREFILLED) 2 X 200 MG/ML KIT Inject 400 mg into the skin every 30 (thirty) days.   Ruthe Mannan 200-5 MCG/ACT AERO SMARTSIG:2 Puff(s) By Mouth Twice Daily  . IBUPROFEN PO Take by mouth as needed.  Marland Kitchen LORazepam (ATIVAN) 1 MG tablet Take 2 mg by mouth daily.  Marland Kitchen lurasidone (LATUDA) 80 MG TABS tablet Take 80 mg by mouth every evening.   Marland Kitchen QUEtiapine (SEROQUEL) 400 MG tablet Take 800 mg by mouth at bedtime.   No facility-administered encounter medications on file as of 09/06/2020.   Physical Exam: Blood pressure 130/66, pulse 79, temperature 98.1 F (36.7 C), temperature source Skin, height '5\' 10"'  (1.778 m), weight 207 lb 9.6 oz (94.2 kg), SpO2 97 %. Gen:      No acute distress HEENT:  EOMI, sclera anicteric Neck:     No masses; no thyromegaly Lungs:    Clear to auscultation bilaterally; normal respiratory effort CV:         Regular rate and rhythm; no murmurs Abd:      + bowel sounds; soft, non-tender; no palpable masses, no distension Ext:    No edema; adequate peripheral perfusion Skin:      Warm and dry; no rash Neuro: alert and oriented x 3 Psych: normal mood and affect  Data Reviewed: Imaging: CTA 04/15/2017-no pulmonary embolism, patchy upper lobe groundglass opacities CTA 08/23/2018-no PE, patchy bilateral groundglass opacities, predominantly in the  bases. High-resolution CT   PFTs: Spirometry 05/18/18 FEV1 1.76, F/F 65.  Moderate obstruction.  08/10/18 FVC 3.31 [94%), FEV1 2.25 [83%), F/F 68, TLC 83%, DLCO 61% Mild obstruction with moderate diffusion defect.  FENO 08/05/2018-8  Labs: CBC 08/25/2017-WBC 9.6, eos 0% CBC 08/05/2018- WBC 9.1, eos 0.6%, absolute eosinophil count 55  IgE 08/10/2018-79 Alpha-1 antitrypsin 08/10/2018-192, PI MM  Assessment:  Mild COPD PFTs reviewed with mild obstruction, no bronchodilator  response.  Labs show normal IgE and low peripheral eosinophils. She has a diagnosis of asthma but this looks more like COPD.  Continue Dulera inhaler.  Ideally she need to be on a LABA/LAMA but unable to afford due to insurance issues. Did not desat on exertion.  Continue monitoring symptoms.  Abnormal CT, nodules Prior CTs noted with patchy bilateral groundglass opacities.  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  Follow-up high res CT does not show interstitial lung disease. Pulmonary nodules need follow-up.  She has been referred for low-dose screening CT of the chest which has not been done yet.  Active smoker Quit smoking 3 months ago.  Congratulated on cutting  Health maintenance 08/27/2016-Pneumovax She plans on getting her Covid 19 booster Does not want a flu vaccine  Plan/Recommendations: - Continue Dulera - Referral for screening CT chest  Marshell Garfinkel MD [facility] 09/06/2020, 11:46 AM  CC: Caren Macadam, MD

## 2020-09-13 ENCOUNTER — Telehealth: Payer: Self-pay | Admitting: Rheumatology

## 2020-09-13 NOTE — Telephone Encounter (Signed)
Patient advised she may take her booster and then keep her appointment in our office.

## 2020-09-13 NOTE — Telephone Encounter (Signed)
Patient calling because she is scheduled for her Booster before her appointment here tomorrow for Cimza . Does patient need to hold off on Booster, and if so how long? Please call to advise.

## 2020-09-14 ENCOUNTER — Ambulatory Visit: Payer: Medicare Other

## 2020-09-14 NOTE — Telephone Encounter (Signed)
Left a message for the pt to return my call.  

## 2020-09-18 ENCOUNTER — Other Ambulatory Visit: Payer: Self-pay

## 2020-09-18 ENCOUNTER — Ambulatory Visit (INDEPENDENT_AMBULATORY_CARE_PROVIDER_SITE_OTHER): Payer: Medicare Other | Admitting: Physical Therapy

## 2020-09-18 ENCOUNTER — Encounter: Payer: Self-pay | Admitting: Physical Therapy

## 2020-09-18 DIAGNOSIS — R262 Difficulty in walking, not elsewhere classified: Secondary | ICD-10-CM

## 2020-09-18 DIAGNOSIS — M5441 Lumbago with sciatica, right side: Secondary | ICD-10-CM

## 2020-09-18 DIAGNOSIS — R293 Abnormal posture: Secondary | ICD-10-CM

## 2020-09-18 DIAGNOSIS — M6281 Muscle weakness (generalized): Secondary | ICD-10-CM | POA: Diagnosis not present

## 2020-09-18 DIAGNOSIS — G8929 Other chronic pain: Secondary | ICD-10-CM

## 2020-09-18 NOTE — Therapy (Signed)
[name], [facility], [facility] [address] [place], [place], [address] Phone: [phone]   Fax:  [phone]  Physical Therapy Treatment/Progress Note/Discharge Summary Progress Note Reporting Period 07/27/20 to 09/18/20  See note below for Objective Data and Assessment of Progress/Goals.       Patient Details  Name: MEAGEN LIMONES MRN: [id number] Date of Birth: [birth year] Referring Provider (PT): Mcarthur Rossetti MD   Encounter Date: 09/18/2020   PT End of Session - 09/18/20 1424    Visit Number 10    Number of Visits 16    Date for PT Re-Evaluation 09/22/20    PT Start Time 4235   pt arrived late   PT Stop Time 1426    PT Time Calculation (min) 33 min    Activity Tolerance Patient tolerated treatment well;No increased pain    Behavior During Therapy WFL for tasks assessed/performed           Past Medical History:  Diagnosis Date  . Anxiety   . Arthritis   . Avascular necrosis (HCC)    Left hip  . Bipolar 1 disorder ([place])   . COPD (chronic obstructive pulmonary disease) ([place])   . Pneumonia   . PTSD (post-traumatic stress disorder)     Past Surgical History:  Procedure Laterality Date  . bone spurs foot Right   . COLONOSCOPY    . HAND SURGERY Left   . JOINT REPLACEMENT    . TONSILLECTOMY    . TONSILLECTOMY AND ADENOIDECTOMY    . 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 TOTAL HIP ARTHROPLASTY Right 06/04/2019   Procedure: RIGHT TOTAL HIP ARTHROPLASTY ANTERIOR APPROACH;  Surgeon: Mcarthur Rossetti, MD;  Location: [facility];  Service: Orthopedics;  Laterality: Right;    There were no vitals filed for this visit.   Subjective Assessment - 09/18/20 1355    Subjective overall doing pretty well; c/o difficulty with breathing due to COPD; inhalers don't seem to be  helping.  has pulmonologist that is working with pt    Pertinent History B THAs 2020 and COPD chronic    Limitations Sitting;Standing;Walking    How long can you sit comfortably? 20-30 minutes (was 20)    How long can you stand comfortably? up to 30 min    How long can you walk comfortably? breathing main limiting factor, just related to back about 30 min    Currently in Pain? No/denies    Pain Score --   pain 0-1/10 in back over past few weeks   Pain Onset More than a month ago                             [facility] Adult PT Treatment/Exercise - 09/18/20 1404      Lumbar Exercises: Stretches   Single Knee to Chest Stretch Right;Left;2 reps;20 seconds    Hip Flexor Stretch --    Piriformis Stretch Left;Right;20 seconds;3 reps    Gastroc Stretch Right;Left;2 reps;20 seconds      Lumbar Exercises: Standing   Heel Raises 10 reps;3 seconds   with toe raises   Other Standing Lumbar Exercises tandem stand with intermittent UE support 2x20 sec bil                       PT Long Term  Goals - 09/18/20 1425      PT LONG TERM GOAL #1   Title Improve FOTO score to 64.    Baseline 40 (was 48)    Time 5    Period Weeks    Status Not Met      PT LONG TERM GOAL #2   Title Improve standing and walking endurance to a comfortable 15-20 minutes with appropriate modifications for COPD (pursed-lip breathing and standing breaks).    Baseline Slightly improved (COPD is most limiting).    Time 5    Period Weeks    Status Achieved      PT LONG TERM GOAL #3   Title Tye [place] will report low back pain consistently 0-3/10 on the Numeric Pain Rating Scale without sciatica.    Baseline 0-3/10 (was > 5/10 with sciatica).    Time 5    Period Weeks    Status Achieved      PT LONG TERM GOAL #4   Title Tye [place] will be independent and compliant with her HEP at DC.    Time 5    Period Weeks    Status Achieved                 Plan - 09/18/20 1425    Clinical Impression  Statement Pt has met all goals except FOTO score, and overall doing well.  She's ready for d/c today and continues to be active with community fitness at gym.    Personal Factors and Comorbidities Comorbidity 1    Comorbidities COPD and B THAs    Examination-Activity Limitations Sit;Bend;Lift;Squat;Locomotion Level;Stairs;Carry;Stand    Examination-Participation Restrictions Interpersonal Relationship;Community Activity    Stability/Clinical Decision Making Stable/Uncomplicated    Rehab Potential Good    PT Frequency 2x / week    PT Duration 8 weeks    PT Treatment/Interventions ADLs/Self Care Home Management;Cryotherapy;Traction;Therapeutic activities;Gait training;Therapeutic exercise;Neuromuscular re-education;Patient/family education;Manual techniques;Dry needling    PT Next Visit Plan d/c PT today    PT Home Exercise Plan Access Code: [email or web address]    Consulted and Agree with Plan of Care Patient           Patient will benefit from skilled therapeutic intervention in order to improve the following deficits and impairments:  Cardiopulmonary status limiting activity,Decreased activity tolerance,Decreased endurance,Decreased range of motion,Decreased strength,Difficulty walking,Hypomobility,Impaired flexibility,Increased muscle spasms,Postural dysfunction,Improper body mechanics,Pain  Visit Diagnosis: Abnormal posture  Difficulty walking  Muscle weakness (generalized)  Chronic bilateral low back pain with right-sided sciatica  Chronic left-sided low back pain with right-sided sciatica     Problem List Patient Active Problem List   Diagnosis Date Noted  . 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 Avascular necrosis of bone of right hip ([place]) 04/28/2019  . Status post total replacement of left hip 03/19/2019  . Avascular necrosis of bone of hip, left ([place]) 12/28/2018  . PTSD (post-traumatic stress disorder)   . COPD (chronic obstructive pulmonary disease) ([place])    . Bipolar 1 disorder ([place])   . Anxiety   . B12 deficiency 12/09/2018  . COPD with acute exacerbation ([place]) 04/14/2017  . Hyperglycemia 04/14/2017     Laureen Abrahams, PT, DPT 09/18/20 2:31 PM    [facility] [address] [place], [place], [address] Phone: [phone]   Fax:  [phone]  Name: MARRISA KIMBER MRN: [id number] Date of Birth: Aug 08, 1951     PHYSICAL THERAPY DISCHARGE SUMMARY  Visits from Start of Care: 10  Current functional level related to  goals / functional outcomes: See above   Remaining deficits: See above   Education / Equipment: HEP  Plan: Patient agrees to discharge.  Patient goals were partially met. Patient is being discharged due to being pleased with the current functional level.  ?????        Laureen Abrahams, PT, DPT 09/18/20 2:32 PM  [facility] [address] [place], [place], [address] Phone: [phone]   Fax:  [phone]

## 2020-09-19 ENCOUNTER — Telehealth: Payer: Self-pay

## 2020-09-19 ENCOUNTER — Telehealth: Payer: Self-pay | Admitting: Acute Care

## 2020-09-19 DIAGNOSIS — Z87891 Personal history of nicotine dependence: Secondary | ICD-10-CM

## 2020-09-19 NOTE — Telephone Encounter (Signed)
Patient called stating she was returning Andrea's call.   °

## 2020-09-19 NOTE — Telephone Encounter (Signed)
Patient advised we need to reschedule her Cimzia injection. Patient has been rescheduled for 09/21/2020 at 2:00 pm.

## 2020-09-21 ENCOUNTER — Ambulatory Visit (INDEPENDENT_AMBULATORY_CARE_PROVIDER_SITE_OTHER): Payer: Medicare Other | Admitting: *Deleted

## 2020-09-21 ENCOUNTER — Other Ambulatory Visit: Payer: Self-pay

## 2020-09-21 VITALS — BP 122/77 | HR 109

## 2020-09-21 DIAGNOSIS — M45 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: Secondary | ICD-10-CM

## 2020-09-21 MED ORDER — CERTOLIZUMAB PEGOL 2 X 200 MG ~~LOC~~ KIT
400.0000 mg | PACK | Freq: Once | SUBCUTANEOUS | Status: AC
  Administered 2020-09-21: 400 mg via SUBCUTANEOUS

## 2020-09-21 NOTE — Progress Notes (Signed)
Pharmacy Note  Subjective:   Patient presents to clinic today to receive monthly dose of Cimzia.  Patient running a fever or have signs/symptoms of infection? No  Patient currently on antibiotics for the treatment of infection? No  Patient have any upcoming invasive procedures/surgeries? No  Objective: CMP     Component Value Date/Time   NA 141 09/05/2020 1508   K 4.6 09/05/2020 1508   CL 107 09/05/2020 1508   CO2 26 09/05/2020 1508   GLUCOSE 92 09/05/2020 1508   BUN 11 09/05/2020 1508   CREATININE 0.93 09/05/2020 1508   CALCIUM 9.6 09/05/2020 1508   PROT 6.3 09/05/2020 1508   ALBUMIN 3.8 12/11/2018 1023   AST 13 09/05/2020 1508   ALT 16 09/05/2020 1508   ALKPHOS 139 (H) 12/11/2018 1023   BILITOT 0.3 09/05/2020 1508   GFRNONAA 63 09/05/2020 1508   GFRAA 73 09/05/2020 1508    CBC    Component Value Date/Time   WBC 5.5 09/05/2020 1508   RBC 4.06 09/05/2020 1508   HGB 12.5 09/05/2020 1508   HCT 37.2 09/05/2020 1508   PLT 226 09/05/2020 1508   MCV 91.6 09/05/2020 1508   MCH 30.8 09/05/2020 1508   MCHC 33.6 09/05/2020 1508   RDW 12.5 09/05/2020 1508   LYMPHSABS 1,612 09/05/2020 1508   MONOABS 0.5 12/11/2018 1023   EOSABS 99 09/05/2020 1508   BASOSABS 22 09/05/2020 1508    Baseline Immunosuppressant Therapy Labs TB GOLD Quantiferon TB Gold Latest Ref Rng & Units 04/26/2020  Quantiferon TB Gold Plus NEGATIVE NEGATIVE   Hepatitis Panel Hepatitis Latest Ref Rng & Units 04/26/2020  Hep B Surface Ag NON-REACTI NON-REACTIVE  Hep B IgM NON-REACTI NON-REACTIVE  Hep C Ab NON-REACTI -  Hep C Ab NON-REACTI -   HIV Lab Results  Component Value Date   HIV NON-REACTIVE 04/26/2020   HIV Non Reactive 08/23/2017   HIV Non Reactive 04/14/2017   Immunoglobulins Immunoglobulin Electrophoresis Latest Ref Rng & Units 04/26/2020  IgA  70 - 320 mg/dL 120  IgG 600 - 1,540 mg/dL 824  IgM 50 - 300 mg/dL 47(L)   SPEP Serum Protein Electrophoresis Latest Ref Rng & Units  09/05/2020  Total Protein 6.1 - 8.1 g/dL 6.3  Albumin 3.8 - 4.8 g/dL -  Alpha-1 0.2 - 0.3 g/dL -  Alpha-2 0.5 - 0.9 g/dL -  Beta Globulin 0.4 - 0.6 g/dL -  Beta 2 0.2 - 0.5 g/dL -  Gamma Globulin 0.8 - 1.7 g/dL -   G6PD No results found for: G6PDH TPMT No results found for: TPMT   Chest x-ray: No active cardiopulmonary disease.08/22/2017  Assessment/Plan:   Administrations This Visit    Certolizumab Pegol KIT 400 mg    Admin Date 09/21/2020 Action Given Dose 400 mg Route Subcutaneous Administered By Carole Binning, LPN          Patient tolerated injection well.   Appointment for next injection scheduled for 10/19/2020.  Patient due for labs in February 2022.  Patient is to call and reschedule appointment if running a fever with signs/symptoms of infection, on antibiotics for active infection or has an upcoming invasive procedure.  All questions encouraged and answered.  Instructed patient to call with any further questions or concerns.

## 2020-09-21 NOTE — Telephone Encounter (Signed)
LMTC x 1  

## 2020-09-22 NOTE — Telephone Encounter (Signed)
Spoke with pt and scheduled SDMV 11/01/20 2:00 CT ordered Nothing further needed

## 2020-09-27 ENCOUNTER — Telehealth: Payer: Self-pay | Admitting: Acute Care

## 2020-09-27 NOTE — Telephone Encounter (Signed)
Spoke with pt and verified lung screening appt on 11/01/20. Pt verbalized understanding. Nothing further needed.

## 2020-10-02 ENCOUNTER — Encounter: Payer: Medicare Other | Admitting: Physical Therapy

## 2020-10-04 NOTE — Telephone Encounter (Signed)
Left a detailed message at the pts cell number with a message from the PCP as below regarding her cholesterol and asked that she return a call to the office.

## 2020-10-11 MED ORDER — ATORVASTATIN CALCIUM 40 MG PO TABS: 40.0000 mg | ORAL_TABLET | Freq: Every day | ORAL | 1 refills | Status: DC

## 2020-10-11 NOTE — Telephone Encounter (Signed)
Patient called back and was informed of the message below. Patient stated she has not taken Lipitor in years, does not recall the reason she stopped taking this and agreed to restart.  Lab appt scheduled for 01/15/2021 to arrive fasting at 12:50pm per pts preference.

## 2020-10-11 NOTE — Addendum Note (Signed)
Addended by: Johnella Moloney on: 10/11/2020 04:33 PM   Modules accepted: Orders

## 2020-10-12 ENCOUNTER — Telehealth: Payer: Self-pay | Admitting: Rheumatology

## 2020-10-12 NOTE — Telephone Encounter (Signed)
Please call patient to schedule next Cimzia appointment. She had 10/22/20 down, but that is a Sunday.

## 2020-10-12 NOTE — Telephone Encounter (Signed)
Patient advised her last injection was on 09/21/2020 and her next injection is due on 10/19/2020. Patient is scheduled for 10/19/2020 at 2:00 pm. Patient expressed understanding.

## 2020-10-18 ENCOUNTER — Other Ambulatory Visit: Payer: Self-pay

## 2020-10-18 ENCOUNTER — Encounter: Payer: Self-pay | Admitting: Physician Assistant

## 2020-10-18 ENCOUNTER — Ambulatory Visit (INDEPENDENT_AMBULATORY_CARE_PROVIDER_SITE_OTHER): Payer: Medicare Other

## 2020-10-18 ENCOUNTER — Ambulatory Visit (INDEPENDENT_AMBULATORY_CARE_PROVIDER_SITE_OTHER): Payer: Medicare Other | Admitting: Physician Assistant

## 2020-10-18 DIAGNOSIS — M25562 Pain in left knee: Secondary | ICD-10-CM

## 2020-10-18 MED ORDER — METHYLPREDNISOLONE ACETATE 40 MG/ML IJ SUSP
40.0000 mg | INTRAMUSCULAR | Status: AC | PRN
  Administered 2020-10-18: 40 mg via INTRA_ARTICULAR

## 2020-10-18 MED ORDER — LIDOCAINE HCL 1 % IJ SOLN
5.0000 mL | INTRAMUSCULAR | Status: AC | PRN
  Administered 2020-10-18: 5 mL

## 2020-10-18 NOTE — Progress Notes (Signed)
Office Visit Note   [name]           Date of Birth: 1951-08-11           MRN: [id number] Visit Date: 10/18/2020              Requested by: Wynn Banker, MD [address] [place],  [place] [address] PCP: Wynn Banker, MD   Assessment & Plan: Visit Diagnoses:  1. Acute pain of left knee     Plan: We will see her back in just 2 weeks to see what type of response she had to the injection.  She is to be mindful of any mechanical symptoms.  She has continued mechanical symptoms, pain or recurrent effusion would recommend MRI to rule out meniscal tear.  Follow-Up Instructions: Return in about 2 weeks (around 11/01/2020).   Orders:  Orders Placed This Encounter  Procedures  . Large Joint Inj: L knee  . XR Knee 1-2 Views Left   No orders of the defined types were placed in this encounter.     Procedures: Large Joint Inj: L knee on 10/18/2020 3:39 PM Indications: pain Details: 22 G 1.5 in needle, superolateral approach  Arthrogram: No  Medications: 40 mg methylPREDNISolone acetate 40 MG/ML; 5 mL lidocaine 1 % Aspirate: 10 mL yellow Outcome: tolerated well, no immediate complications Procedure, treatment alternatives, risks and benefits explained, specific risks discussed. Consent was given by the patient. Immediately prior to procedure a time out was called to verify the correct patient, procedure, equipment, support staff and site/side marked as required. Patient was prepped and draped in the usual sterile fashion.       Clinical Data: No additional findings.   Subjective: Chief Complaint  Patient presents with  . Left Knee - Pain    HPI Judy [name] is a 70 year old female comes in today with left knee pain for the past 2 weeks has no known injury.  She does work out at [employer] regularly and states she could have injured it then.  She has tried ibuprofen with no relief.  She notes the knee gives way but no other mechanical  symptoms.  She is having pain mostly along the lateral joint line.  Review of Systems Negative for fevers chills shortness of breath recent vaccines.  Objective: Vital Signs: There were no vitals taken for this visit.  Physical Exam Constitutional:      Appearance: She is not ill-appearing or diaphoretic.  Pulmonary:     Effort: Pulmonary effort is normal.  Neurological:     Mental Status: She is alert and oriented to person, place, and time.  Psychiatric:        Mood and Affect: Mood normal.     Ortho Exam Left knee: Good range of motion of the left knee.  Tenderness along lateral joint line.  No instability valgus varus stressing.  No abnormal warmth erythema.  Slight effusion.  McMurray's positive.  Specialty Comments:  No specialty comments available.  Imaging: XR Knee 1-2 Views Left  Result Date: 10/18/2020 Left knee 2 views: Knee is well located.  Mild narrowing medial joint line.  Mild patellofemoral changes.  Lateral joint line well-maintained.  No acute fractures.    PMFS History: Patient Active Problem List   Diagnosis Date Noted  . 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  . Status post total replacement of left hip 03/19/2019  .  Avascular necrosis of bone of hip, 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  . PTSD (post-traumatic stress disorder)   . COPD (chronic obstructive pulmonary disease) (HCC)   . Bipolar 1 disorder (HCC)   . Anxiety   . B12 deficiency 12/09/2018  . COPD with acute exacerbation (HCC) 04/14/2017  . Hyperglycemia 04/14/2017   Past Medical History:  Diagnosis Date  . Anxiety   . Arthritis   . Avascular necrosis (HCC)    Left hip  . Bipolar 1 disorder (HCC)   . COPD (chronic obstructive pulmonary disease) (HCC)   . Pneumonia   . PTSD (post-traumatic stress disorder)     Family History  Problem Relation Age of Onset  . CAD Mother 20  . COPD Mother   . Depression Mother   .  Heart attack Father   . Colon polyps Sister   . Heart attack Maternal Grandmother   . Early death Maternal Grandfather   . Hearing loss Paternal Grandfather   . Bipolar disorder Son   . Asthma Daughter     Past Surgical History:  Procedure Laterality Date  . bone spurs foot Right   . COLONOSCOPY    . HAND SURGERY Left   . JOINT REPLACEMENT    . TONSILLECTOMY    . TONSILLECTOMY AND ADENOIDECTOMY    . TOTAL HIP ARTHROPLASTY Left 03/19/2019   Procedure: LEFT TOTAL HIP ARTHROPLASTY ANTERIOR APPROACH;  Surgeon: [name], MD;  Location: [facility];  Service: Orthopedics;  Laterality: Left;  . TOTAL HIP ARTHROPLASTY Right 06/04/2019   Procedure: RIGHT TOTAL HIP ARTHROPLASTY ANTERIOR APPROACH;  Surgeon: [name], MD;  Location: [facility];  Service: Orthopedics;  Laterality: Right;   Social History   Occupational History  . Occupation: retired  Tobacco Use  . Smoking status: Former Smoker    Years: 43.00    Types: Cigarettes    Quit date: 06/18/2020    Years since quitting: 0.3  . Smokeless tobacco: Never Used  Vaping Use  . Vaping Use: Never used  Substance and Sexual Activity  . Alcohol use: Yes    Comment: occ  . Drug use: No  . Sexual activity: Not on file

## 2020-10-19 ENCOUNTER — Ambulatory Visit (INDEPENDENT_AMBULATORY_CARE_PROVIDER_SITE_OTHER): Payer: Medicare Other | Admitting: *Deleted

## 2020-10-19 VITALS — BP 131/71 | HR 82

## 2020-10-19 DIAGNOSIS — M45 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: Secondary | ICD-10-CM | POA: Diagnosis not present

## 2020-10-19 MED ORDER — CERTOLIZUMAB PEGOL 2 X 200 MG ~~LOC~~ KIT
400.0000 mg | PACK | Freq: Once | SUBCUTANEOUS | Status: AC
  Administered 2020-10-19: 400 mg via SUBCUTANEOUS

## 2020-10-19 NOTE — Progress Notes (Signed)
Pharmacy Note  Subjective:   Patient presents to clinic today to receive monthly dose of Cimzia.  Patient running a fever or have signs/symptoms of infection? No  Patient currently on antibiotics for the treatment of infection? No  Patient have any upcoming invasive procedures/surgeries? No  Objective: CMP     Component Value Date/Time   NA 141 09/05/2020 1508   K 4.6 09/05/2020 1508   CL 107 09/05/2020 1508   CO2 26 09/05/2020 1508   GLUCOSE 92 09/05/2020 1508   BUN 11 09/05/2020 1508   CREATININE 0.93 09/05/2020 1508   CALCIUM 9.6 09/05/2020 1508   PROT 6.3 09/05/2020 1508   ALBUMIN 3.8 12/11/2018 1023   AST 13 09/05/2020 1508   ALT 16 09/05/2020 1508   ALKPHOS 139 (H) 12/11/2018 1023   BILITOT 0.3 09/05/2020 1508   GFRNONAA 63 09/05/2020 1508   GFRAA 73 09/05/2020 1508    CBC    Component Value Date/Time   WBC 5.5 09/05/2020 1508   RBC 4.06 09/05/2020 1508   HGB 12.5 09/05/2020 1508   HCT 37.2 09/05/2020 1508   PLT 226 09/05/2020 1508   MCV 91.6 09/05/2020 1508   MCH 30.8 09/05/2020 1508   MCHC 33.6 09/05/2020 1508   RDW 12.5 09/05/2020 1508   LYMPHSABS 1,612 09/05/2020 1508   MONOABS 0.5 12/11/2018 1023   EOSABS 99 09/05/2020 1508   BASOSABS 22 09/05/2020 1508    Baseline Immunosuppressant Therapy Labs TB GOLD Quantiferon TB Gold Latest Ref Rng & Units 04/26/2020  Quantiferon TB Gold Plus NEGATIVE NEGATIVE   Hepatitis Panel Hepatitis Latest Ref Rng & Units 04/26/2020  Hep B Surface Ag NON-REACTI NON-REACTIVE  Hep B IgM NON-REACTI NON-REACTIVE  Hep C Ab NON-REACTI -  Hep C Ab NON-REACTI -   HIV Lab Results  Component Value Date   HIV NON-REACTIVE 04/26/2020   HIV Non Reactive 08/23/2017   HIV Non Reactive 04/14/2017   Immunoglobulins Immunoglobulin Electrophoresis Latest Ref Rng & Units 04/26/2020  IgA  70 - 320 mg/dL 120  IgG 600 - 1,540 mg/dL 824  IgM 50 - 300 mg/dL 47(L)   SPEP Serum Protein Electrophoresis Latest Ref Rng & Units  09/05/2020  Total Protein 6.1 - 8.1 g/dL 6.3  Albumin 3.8 - 4.8 g/dL -  Alpha-1 0.2 - 0.3 g/dL -  Alpha-2 0.5 - 0.9 g/dL -  Beta Globulin 0.4 - 0.6 g/dL -  Beta 2 0.2 - 0.5 g/dL -  Gamma Globulin 0.8 - 1.7 g/dL -   G6PD No results found for: G6PDH TPMT No results found for: TPMT   Chest x-ray: No active cardiopulmonary disease.08/22/2017  Assessment/Plan:   Administrations This Visit    Certolizumab Pegol KIT 400 mg    Admin Date 10/19/2020 Action Given Dose 400 mg Route Subcutaneous Administered By Carole Binning, LPN          Patient tolerated injection well.   Appointment for next injection scheduled for 11/20/2020.  Patient due for labs in February 2022.  Patient is to call and reschedule appointment if running a fever with signs/symptoms of infection, on antibiotics for active infection or has an upcoming invasive procedure.  All questions encouraged and answered.  Instructed patient to call with any further questions or concerns.

## 2020-10-30 ENCOUNTER — Encounter: Payer: Self-pay | Admitting: Family Medicine

## 2020-10-30 ENCOUNTER — Ambulatory Visit: Payer: Medicare Other | Admitting: Orthopaedic Surgery

## 2020-10-30 ENCOUNTER — Ambulatory Visit (INDEPENDENT_AMBULATORY_CARE_PROVIDER_SITE_OTHER): Payer: Medicare Other | Admitting: Family Medicine

## 2020-10-30 ENCOUNTER — Other Ambulatory Visit: Payer: Self-pay

## 2020-10-30 VITALS — BP 120/80 | HR 82 | Temp 97.8°F | Ht 68.0 in | Wt 214.2 lb

## 2020-10-30 DIAGNOSIS — R635 Abnormal weight gain: Secondary | ICD-10-CM

## 2020-10-30 DIAGNOSIS — R739 Hyperglycemia, unspecified: Secondary | ICD-10-CM

## 2020-10-30 DIAGNOSIS — R040 Epistaxis: Secondary | ICD-10-CM

## 2020-10-30 DIAGNOSIS — L678 Other hair color and hair shaft abnormalities: Secondary | ICD-10-CM | POA: Diagnosis not present

## 2020-10-30 DIAGNOSIS — E785 Hyperlipidemia, unspecified: Secondary | ICD-10-CM | POA: Diagnosis not present

## 2020-10-30 DIAGNOSIS — E559 Vitamin D deficiency, unspecified: Secondary | ICD-10-CM | POA: Diagnosis not present

## 2020-10-30 DIAGNOSIS — E538 Deficiency of other specified B group vitamins: Secondary | ICD-10-CM | POA: Diagnosis not present

## 2020-10-30 DIAGNOSIS — J449 Chronic obstructive pulmonary disease, unspecified: Secondary | ICD-10-CM | POA: Diagnosis not present

## 2020-10-30 DIAGNOSIS — F319 Bipolar disorder, unspecified: Secondary | ICD-10-CM

## 2020-10-30 DIAGNOSIS — R252 Cramp and spasm: Secondary | ICD-10-CM

## 2020-10-30 LAB — COMPREHENSIVE METABOLIC PANEL
ALT: 15 U/L (ref 0–35)
AST: 11 U/L (ref 0–37)
Albumin: 4.2 g/dL (ref 3.5–5.2)
Alkaline Phosphatase: 144 U/L — ABNORMAL HIGH (ref 39–117)
BUN: 13 mg/dL (ref 6–23)
CO2: 28 mEq/L (ref 19–32)
Calcium: 9.5 mg/dL (ref 8.4–10.5)
Chloride: 106 mEq/L (ref 96–112)
Creatinine, Ser: 0.99 mg/dL (ref 0.40–1.20)
GFR: 58.48 mL/min — ABNORMAL LOW (ref >60.00)
Glucose, Bld: 95 mg/dL (ref 70–99)
Potassium: 4.1 mEq/L (ref 3.5–5.1)
Sodium: 140 mEq/L (ref 135–145)
Total Bilirubin: 0.3 mg/dL (ref 0.2–1.2)
Total Protein: 6.5 g/dL (ref 6.0–8.3)

## 2020-10-30 LAB — CBC WITH DIFFERENTIAL/PLATELET
Basophils Absolute: 0 10*3/uL (ref 0.0–0.1)
Basophils Relative: 0.6 % (ref 0.0–3.0)
Eosinophils Absolute: 0.1 10*3/uL (ref 0.0–0.7)
Eosinophils Relative: 1.2 % (ref 0.0–5.0)
HCT: 39.1 % (ref 36.0–46.0)
Hemoglobin: 12.9 g/dL (ref 12.0–15.0)
Lymphocytes Relative: 31.8 % (ref 12.0–46.0)
Lymphs Abs: 1.9 10*3/uL (ref 0.7–4.0)
MCHC: 32.9 g/dL (ref 30.0–36.0)
MCV: 89.5 fl (ref 78.0–100.0)
Monocytes Absolute: 0.4 10*3/uL (ref 0.1–1.0)
Monocytes Relative: 6.8 % (ref 3.0–12.0)
Neutro Abs: 3.6 10*3/uL (ref 1.4–7.7)
Neutrophils Relative %: 59.6 % (ref 43.0–77.0)
Platelets: 212 10*3/uL (ref 150.0–400.0)
RBC: 4.37 Mil/uL (ref 3.87–5.11)
RDW: 13.3 % (ref 11.5–15.5)
WBC: 6 10*3/uL (ref 4.0–10.5)

## 2020-10-30 LAB — VITAMIN D 25 HYDROXY (VIT D DEFICIENCY, FRACTURES): VITD: 12.39 ng/mL — ABNORMAL LOW (ref 30.00–100.00)

## 2020-10-30 LAB — LIPID PANEL
Cholesterol: 192 mg/dL (ref 0–200)
HDL: 50.2 mg/dL (ref >39.00)
LDL Cholesterol: 108 mg/dL — ABNORMAL HIGH (ref 0–99)
NonHDL: 141.64
Total CHOL/HDL Ratio: 4
Triglycerides: 167 mg/dL — ABNORMAL HIGH (ref 0.0–149.0)
VLDL: 33.4 mg/dL (ref 0.0–40.0)

## 2020-10-30 LAB — TESTOSTERONE: Testosterone: 29.82 ng/dL (ref 15.00–40.00)

## 2020-10-30 LAB — VITAMIN B12: Vitamin B-12: 170 pg/mL — ABNORMAL LOW (ref 211–911)

## 2020-10-30 LAB — HEMOGLOBIN A1C: Hgb A1c MFr Bld: 5.8 % (ref 4.6–6.5)

## 2020-10-30 LAB — TSH: TSH: 1.28 u[IU]/mL (ref 0.35–4.50)

## 2020-10-30 NOTE — Progress Notes (Signed)
Judy [name] DOB: [birth year] Encounter date: 10/30/2020  This is a 70 y.o. female who presents for complete physical   History of present illness/Additional concerns:  Since her last visit, she has had both her left and right hips replaced.  She quit smoking again in August this year. She is gaining weight she feels like daily. All she eats is frozen dinners; goes out/cooks every week or two. Lives on gold fish/twizzlers. Eats 2 pieces toast for breakfast about 11 oclock. Dinner 6-7. 8:30 has snack gold fish/twizzlers.   Bipolar 1:vraylar 33m daily, latuda 831mqhs, seroquel 80073mhs  COPD: Dulera 200-5 mcg 2 puffs twice daily inhaler as needed. Follows with Dr. ManVaughan Brownerhe doesn't feel like she is getting better from this standpoint. Wheezing has quit since stopping smoking.   Hyperlipidemia: Lipitor 40 mg (she forgot if she was taking this in past; numbers were much better when on medication).   Ankylosing spondylitis: following with Dr. DevEstanislado Pandyheum.  Started on cimzia q 4 weeks. (noted dizziness at last visit). Notes more with lying down. When getting up out of bed/chair feels she is veering to left. Has had 6 bloody noses in last 1-57mo5monthone in years before this. Last just a couple of minutes. Has these about once a week for last 1.5 mo.   Gets cramp under left breast - feels like ping pong ball; last 10-15 seconds; feels like big cramp. Can feel it under ribs; just goes away on its own.   Having to shave chin daily. Noted in last 2 years. Not noting excess hair/change in hair otherwise.   Past Medical History:  Diagnosis Date  . Anxiety   . Arthritis   . Avascular necrosis (HCC)    Left hip  . Bipolar 1 disorder (HCC)[place]. COPD (chronic obstructive pulmonary disease) (HCC)[place]. Pneumonia   . PTSD (post-traumatic stress disorder)    Past Surgical History:  Procedure Laterality Date  . bone spurs foot Right   . COLONOSCOPY    . HAND SURGERY Left   . JOINT  REPLACEMENT    . TONSILLECTOMY    . TONSILLECTOMY AND ADENOIDECTOMY    . TOTAL HIP ARTHROPLASTY Left 03/19/2019   Procedure: LEFT TOTAL HIP ARTHROPLASTY ANTERIOR APPROACH;  Surgeon: BlacMcarthur Rossetti;  Location: [facility];  Service: Orthopedics;  Laterality: Left;  . TOTAL HIP ARTHROPLASTY Right 06/04/2019   Procedure: RIGHT TOTAL HIP ARTHROPLASTY ANTERIOR APPROACH;  Surgeon: BlacMcarthur Rossetti;  Location: [facility];  Service: Orthopedics;  Laterality: Right;   Allergies  Allergen Reactions  . Celexa [Citalopram Hydrobromide]     "goes weird"  . Gabapentin     "goes weird"    Current Meds  Medication Sig  . albuterol (VENTOLIN HFA) 108 (90 Base) MCG/ACT inhaler Inhale 1-2 puffs into the lungs every 6 (six) hours as needed for wheezing or shortness of breath.  . atMarland Kitchenrvastatin (LIPITOR) 40 MG tablet Take 1 tablet (40 mg total) by mouth daily.  . cariprazine (VRAYLAR) capsule Take 6 mg by mouth daily.   . Certolizumab Pegol (CIMZIA PREFILLED) 2 X 200 MG/ML KIT Inject 400 mg into the skin every 30 (thirty) days.   . DURuthe Mannan-5 MCG/ACT AERO SMARTSIG:2 Puff(s) By Mouth Twice Daily  . IBUPROFEN PO Take by mouth as needed.  . LOMarland Kitchenazepam (ATIVAN) 1 MG tablet Take 2 mg by mouth daily.  . luMarland Kitchenasidone (LATUDA) 80 MG TABS tablet Take 80 mg by mouth every evening.   .Marland Kitchen  QUEtiapine (SEROQUEL) 400 MG tablet Take 800 mg by mouth at bedtime.   Social History   Tobacco Use  . Smoking status: Former Smoker    Years: 43.00    Types: Cigarettes    Quit date: 06/18/2020    Years since quitting: 0.3  . Smokeless tobacco: Never Used  Substance Use Topics  . Alcohol use: Yes    Comment: occ   Family History  Problem Relation Age of Onset  . CAD Mother 34  . COPD Mother   . Depression Mother   . Heart attack Father   . Colon polyps Sister   . Heart attack Maternal Grandmother   . Early death Maternal Grandfather   . Hearing loss Paternal Grandfather   . Bipolar disorder Son   . Asthma  Daughter      Review of Systems  Constitutional: Negative for activity change, appetite change, chills, fatigue, fever and unexpected weight change.  HENT: Negative for congestion, ear pain, hearing loss, sinus pressure, sinus pain, sore throat and trouble swallowing.   Eyes: Negative for pain and visual disturbance.  Respiratory: Positive for shortness of breath. Negative for cough, chest tightness and wheezing.   Cardiovascular: Negative for chest pain, palpitations and leg swelling.  Gastrointestinal: Negative for abdominal pain, blood in stool, constipation, diarrhea, nausea and vomiting.  Genitourinary: Negative for difficulty urinating and menstrual problem.  Musculoskeletal: Positive for back pain. Negative for arthralgias.  Skin: Negative for rash.  Neurological: Negative for dizziness, weakness, numbness and headaches.  Hematological: Negative for adenopathy. Does not bruise/bleed easily.  Psychiatric/Behavioral: Negative for sleep disturbance and suicidal ideas. The patient is not nervous/anxious.     CBC:  Lab Results  Component Value Date   WBC 6.0 10/30/2020   HGB 12.9 10/30/2020   HCT 39.1 10/30/2020   MCH 30.8 09/05/2020   MCHC 32.9 10/30/2020   RDW 13.3 10/30/2020   PLT 212.0 10/30/2020   MPV 9.9 09/05/2020   CMP: Lab Results  Component Value Date   NA 140 10/30/2020   K 4.1 10/30/2020   CL 106 10/30/2020   CO2 28 10/30/2020   ANIONGAP 8 06/05/2019   GLUCOSE 95 10/30/2020   BUN 13 10/30/2020   CREATININE 0.99 10/30/2020   CREATININE 0.93 09/05/2020   GFRAA 73 09/05/2020   CALCIUM 9.5 10/30/2020   PROT 6.5 10/30/2020   BILITOT 0.3 10/30/2020   ALKPHOS 144 (H) 10/30/2020   ALT 15 10/30/2020   AST 11 10/30/2020   LIPID: Lab Results  Component Value Date   CHOL 192 10/30/2020   TRIG 167.0 (H) 10/30/2020   HDL 50.20 10/30/2020   LDLCALC 108 (H) 10/30/2020   LDLCALC 244 (H) 07/21/2020    Objective:  BP 120/80 (BP Location: Right Arm, Patient  Position: Sitting, Cuff Size: Large)   Pulse 82   Temp 97.8 F (36.6 C) (Oral)   Ht _0  (1.727 m)   Wt 214 lb 3.2 oz (97.2 kg)   SpO2 96%   BMI 32.57 kg/m   Weight: 214 lb 3.2 oz (97.2 kg)   BP Readings from Last 3 Encounters:  11/01/20 112/74  10/30/20 120/80  10/19/20 131/71   Wt Readings from Last 3 Encounters:  11/01/20 213 lb (96.6 kg)  10/30/20 214 lb 3.2 oz (97.2 kg)  09/06/20 207 lb 9.6 oz (94.2 kg)    Physical Exam Constitutional:      General: She is not in acute distress.    Appearance: She is well-developed and well-nourished.  HENT:     Head: Normocephalic and atraumatic.     Right Ear: External ear normal.     Left Ear: External ear normal.     Mouth/Throat:     Mouth: Oropharynx is clear and moist.     Pharynx: No oropharyngeal exudate.  Eyes:     Conjunctiva/sclera: Conjunctivae normal.     Pupils: Pupils are equal, round, and reactive to light.  Neck:     Thyroid: No thyromegaly.  Cardiovascular:     Rate and Rhythm: Normal rate and regular rhythm.     Heart sounds: Normal heart sounds. No murmur heard. No friction rub. No gallop.   Pulmonary:     Effort: Pulmonary effort is normal.     Breath sounds: Decreased air movement present. Decreased breath sounds present. No rhonchi or rales.  Abdominal:     General: Bowel sounds are normal. There is no distension.     Palpations: Abdomen is soft. There is no mass.     Tenderness: There is no abdominal tenderness. There is no guarding.     Hernia: No hernia is present.  Musculoskeletal:        General: No tenderness, deformity or edema. Normal range of motion.     Cervical back: Normal range of motion and neck supple.  Lymphadenopathy:     Cervical: No cervical adenopathy.  Skin:    General: Skin is warm and dry.     Findings: No rash.  Neurological:     Mental Status: She is alert and oriented to person, place, and time.     Deep Tendon Reflexes: Strength normal. Reflexes normal.     Reflex  Scores:      Tricep reflexes are 2+ on the right side and 2+ on the left side.      Bicep reflexes are 2+ on the right side and 2+ on the left side.      Brachioradialis reflexes are 2+ on the right side and 2+ on the left side.      Patellar reflexes are 2+ on the right side and 2+ on the left side. Psychiatric:        Mood and Affect: Mood and affect normal.        Speech: Speech normal.        Behavior: Behavior normal.        Thought Content: Thought content normal.     Assessment/Plan: There are no preventive care reminders to display for this patient. Health Maintenance reviewed.  1. Chronic obstructive pulmonary disease, unspecified COPD type ([place]) Has noted some improvement since quitting smoking. Encouraged regular activity; even if only able to do it in small amounts in order to help maintain endurance. Continue with dulera.  2. Hyperglycemia Rechecking bloodwork.  - Hemoglobin A1c; Future - Hemoglobin A1c  3. B12 deficiency She is not supplementing currently; has been low in the past. We will recheck baseline. - Vitamin B12; Future - Vitamin B12  4. Bipolar 1 disorder ([place]) Follows with psych; she feels mood is stable on current medicatioons including: latuda $RemoveBeforeDEI'80mg'DnbupBuaeUdBOJWR$ , seroquel $RemoveBe'800mg'XBLdUtFkX$ .   5. Weight gain Will start with bloodwork.   6. Hyperlipidemia, unspecified hyperlipidemia type Continue with lipitor $RemoveBefo'40mg'SJMeuRrMTJe$  daily. Cholesterol was well controlled when on this medication. - Comprehensive metabolic panel; Future - Lipid panel; Future - TSH; Future - TSH - Lipid panel - Comprehensive metabolic panel - Lipid panel - CMP  7. Muscle cramping - Magnesium, RBC; Future - Magnesium, RBC  8. Vitamin D  deficiency - VITAMIN D 25 Hydroxy (Vit-D Deficiency, Fractures); Future - VITAMIN D 25 Hydroxy (Vit-D Deficiency, Fractures)  9. Epistaxis Will start with bloodwork. Encouraged humidity, saline rinses, vasoline to help lubricate nose. Will need to see ENT if not  improving. - CBC with Differential/Platelet; Future - Iron, TIBC and Ferritin Panel; Future - CBC with Differential/Platelet - Iron, TIBC and Ferritin Panel  10. Abnormal facial hair - Testosterone; Future - DHEA-sulfate; Future - Testosterone - DHEA-sulfate  Return for pending labs.  Micheline Rough, MD  40 minutes spent with patient, exam, chart review, charting.

## 2020-10-30 NOTE — Patient Instructions (Signed)
High Cholesterol  High cholesterol is a condition in which the blood has high levels of a white, waxy substance similar to fat (cholesterol). The liver makes all the cholesterol that the body needs. The human body needs small amounts of cholesterol to help build cells. A person gets extra or excess cholesterol from the food that he or she eats. The blood carries cholesterol from the liver to the rest of the body. If you have high cholesterol, deposits (plaques) may build up on the walls of your arteries. Arteries are the blood vessels that carry blood away from your heart. These plaques make the arteries narrow and stiff. Cholesterol plaques increase your risk for heart attack and stroke. Work with your health care provider to keep your cholesterol levels in a healthy range. What increases the risk? The following factors may make you more likely to develop this condition:  Eating foods that are high in animal fat (saturated fat) or cholesterol.  Being overweight.  Not getting enough exercise.  A family history of high cholesterol (familial hypercholesterolemia).  Use of tobacco products.  Having diabetes. What are the signs or symptoms? There are no symptoms of this condition. How is this diagnosed? This condition may be diagnosed based on the results of a blood test.  If you are older than 69 years of age, your health care provider may check your cholesterol levels every 4-6 years.  You may be checked more often if you have high cholesterol or other risk factors for heart disease. The blood test for cholesterol measures:  "Bad" cholesterol, or LDL cholesterol. This is the main type of cholesterol that causes heart disease. The desired level is less than 100 mg/dL.  "Good" cholesterol, or HDL cholesterol. HDL helps protect against heart disease by cleaning the arteries and carrying the LDL to the liver for processing. The desired level for HDL is 60 mg/dL or higher.  Triglycerides.  These are fats that your body can store or burn for energy. The desired level is less than 150 mg/dL.  Total cholesterol. This measures the total amount of cholesterol in your blood and includes LDL, HDL, and triglycerides. The desired level is less than 200 mg/dL. How is this treated? This condition may be treated with:  Diet changes. You may be asked to eat foods that have more fiber and less saturated fats or added sugar.  Lifestyle changes. These may include regular exercise, maintaining a healthy weight, and quitting use of tobacco products.  Medicines. These are given when diet and lifestyle changes have not worked. You may be prescribed a statin medicine to help lower your cholesterol levels. Follow these instructions at home: Eating and drinking  Eat a healthy, balanced diet. This diet includes: ? Daily servings of a variety of fresh, frozen, or canned fruits and vegetables. ? Daily servings of whole grain foods that are rich in fiber. ? Foods that are low in saturated fats and trans fats. These include poultry and fish without skin, lean cuts of meat, and low-fat dairy products. ? A variety of fish, especially oily fish that contain omega-3 fatty acids. Aim to eat fish at least 2 times a week.  Avoid foods and drinks that have added sugar.  Use healthy cooking methods, such as roasting, grilling, broiling, baking, poaching, steaming, and stir-frying. Do not fry your food except for stir-frying.   Lifestyle  Get regular exercise. Aim to exercise for a total of 150 minutes a week. Increase your activity level by doing activities   such as gardening, walking, and taking the stairs.  Do not use any products that contain nicotine or tobacco, such as cigarettes, e-cigarettes, and chewing tobacco. If you need help quitting, ask your health care provider.   General instructions  Take over-the-counter and prescription medicines only as told by your health care provider.  Keep all  follow-up visits as told by your health care provider. This is important. Where to find more information  American Heart Association: www.heart.org  National Heart, Lung, and Blood Institute: www.nhlbi.nih.gov Contact a health care provider if:  You have trouble achieving or maintaining a healthy diet or weight.  You are starting an exercise program.  You are unable to stop smoking. Get help right away if:  You have chest pain.  You have trouble breathing.  You have any symptoms of a stroke. "BE FAST" is an easy way to remember the main warning signs of a stroke: ? B - Balance. Signs are dizziness, sudden trouble walking, or loss of balance. ? E - Eyes. Signs are trouble seeing or a sudden change in vision. ? F - Face. Signs are sudden weakness or numbness of the face, or the face or eyelid drooping on one side. ? A - Arms. Signs are weakness or numbness in an arm. This happens suddenly and usually on one side of the body. ? S - Speech. Signs are sudden trouble speaking, slurred speech, or trouble understanding what people say. ? T - Time. Time to call emergency services. Write down what time symptoms started.  You have other signs of a stroke, such as: ? A sudden, severe headache with no known cause. ? Nausea or vomiting. ? Seizure. These symptoms may represent a serious problem that is an emergency. Do not wait to see if the symptoms will go away. Get medical help right away. Call your local emergency services (911 in the [place] Do not drive yourself to the hospital. Summary  Cholesterol plaques increase your risk for heart attack and stroke. Work with your health care provider to keep your cholesterol levels in a healthy range.  Eat a healthy, balanced diet, get regular exercise, and maintain a healthy weight.  Do not use any products that contain nicotine or tobacco, such as cigarettes, e-cigarettes, and chewing tobacco.  Get help right away if you have any symptoms of a  stroke. This information is not intended to replace advice given to you by your health care provider. Make sure you discuss any questions you have with your health care provider. Document Revised: 08/23/2019 Document Reviewed: 08/23/2019 Elsevier Patient Education  2021 [employer] Inc.  

## 2020-11-01 ENCOUNTER — Ambulatory Visit
Admission: RE | Admit: 2020-11-01 | Discharge: 2020-11-01 | Disposition: A | Discharge status: Home / Self Care | Payer: Medicare Other | Source: Ambulatory Visit | Attending: Acute Care | Admitting: Acute Care

## 2020-11-01 ENCOUNTER — Ambulatory Visit (INDEPENDENT_AMBULATORY_CARE_PROVIDER_SITE_OTHER): Payer: Medicare Other | Admitting: Acute Care

## 2020-11-01 ENCOUNTER — Encounter: Payer: Self-pay | Admitting: Acute Care

## 2020-11-01 ENCOUNTER — Other Ambulatory Visit: Payer: Self-pay

## 2020-11-01 VITALS — BP 112/74 | HR 67 | Temp 98.2°F | Ht 70.0 in | Wt 213.0 lb

## 2020-11-01 DIAGNOSIS — Z87891 Personal history of nicotine dependence: Secondary | ICD-10-CM | POA: Diagnosis not present

## 2020-11-01 DIAGNOSIS — Z122 Encounter for screening for malignant neoplasm of respiratory organs: Secondary | ICD-10-CM

## 2020-11-01 LAB — MAGNESIUM, RBC: Magnesium RBC: 5.5 mg/dL (ref 4.0–6.4)

## 2020-11-01 LAB — IRON,TIBC AND FERRITIN PANEL
%SAT: 23 % (calc) (ref 16–45)
Ferritin: 40 ng/mL (ref 16–288)
Iron: 76 ug/dL (ref 45–160)
TIBC: 324 mcg/dL (calc) (ref 250–450)

## 2020-11-01 LAB — DHEA-SULFATE: DHEA-SO4: 57 ug/dL (ref 12–133)

## 2020-11-01 IMAGING — CT CT CHEST LUNG CANCER SCREENING LOW DOSE W/O CM
2 of 5 series · 15 of 40 positions shown, 18 images · non-contrast
Comparison: [DATE] chest CT.

CLINICAL DATA: 69-year-old asymptomatic female former smoker with
42 pack-year smoking history, quit smoking 5 months prior.

EXAM:
CT CHEST WITHOUT CONTRAST LOW-DOSE FOR LUNG CANCER SCREENING
TECHNIQUE: Multidetector CT imaging of the chest was performed following the
standard protocol without IV contrast.

[Series 4: lung 1.00 br44 cor · coronal · 0.63mm/px · 3 of 343 slices shown]
[im 69/343  lung]
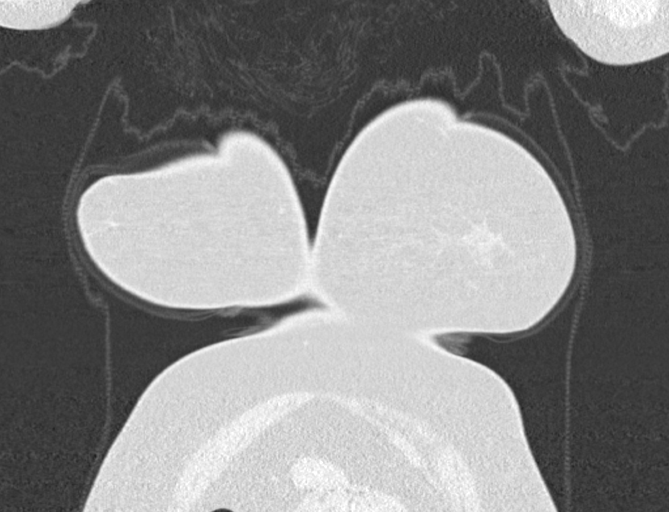
[im 137/343  lung]
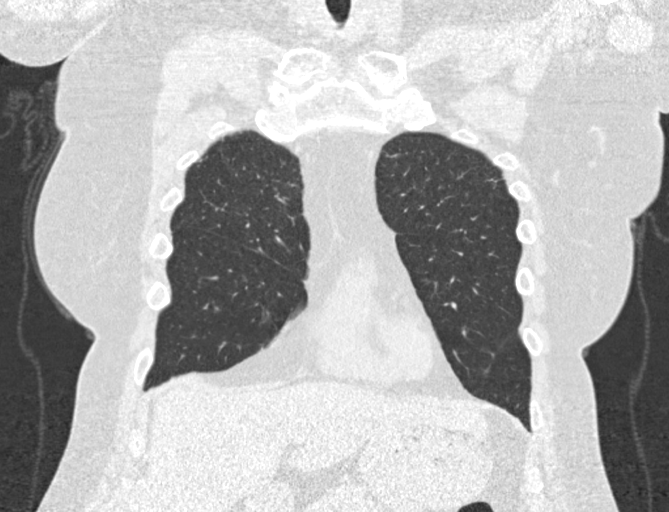
[im 206/343  lung]
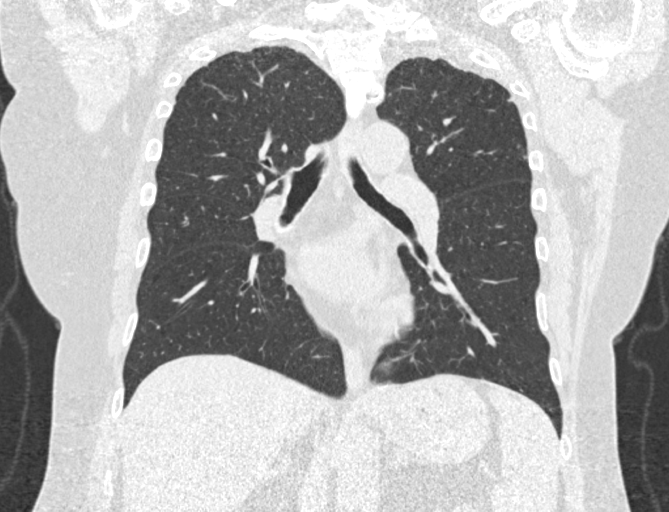

[Series 9: lung 1.00 br60 axial · axial · 0.77mm/px · z∈[-1043,-751]mm · 12 of 324 slices shown, 15 images]
[im 16/324  mediastinal]
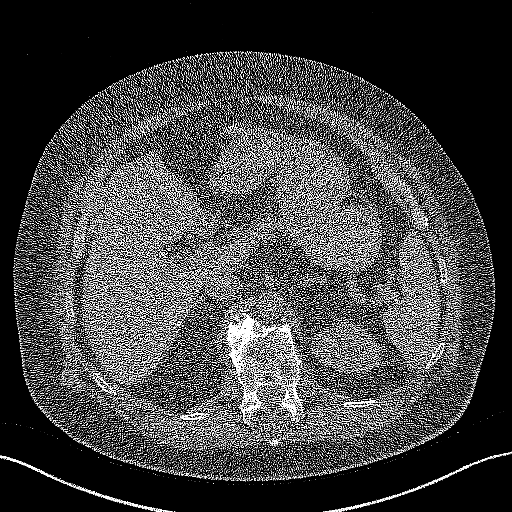
[im 16/324  lung]
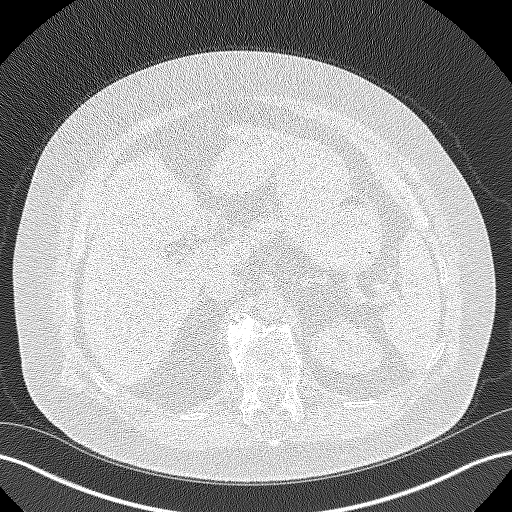
[im 47/324  lung]
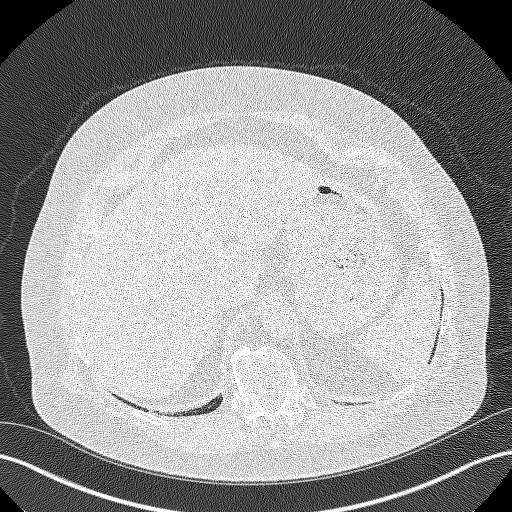
[im 77/324  lung]
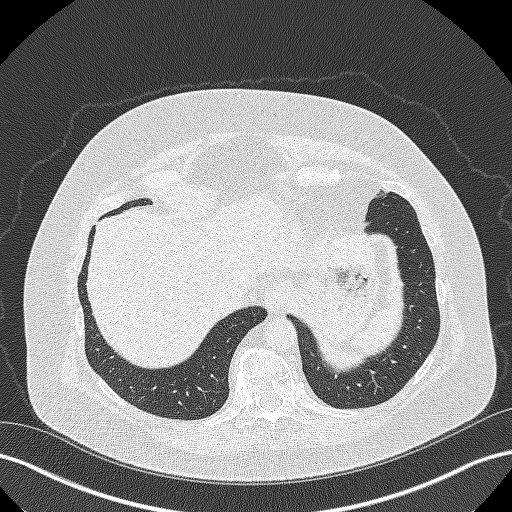
[im 93/324  lung]
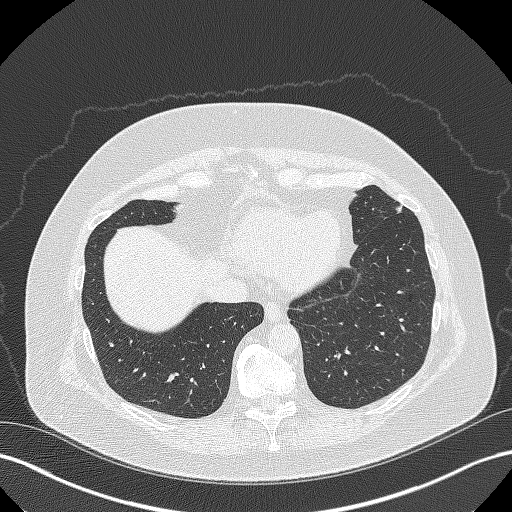
[im 124/324  mediastinal]
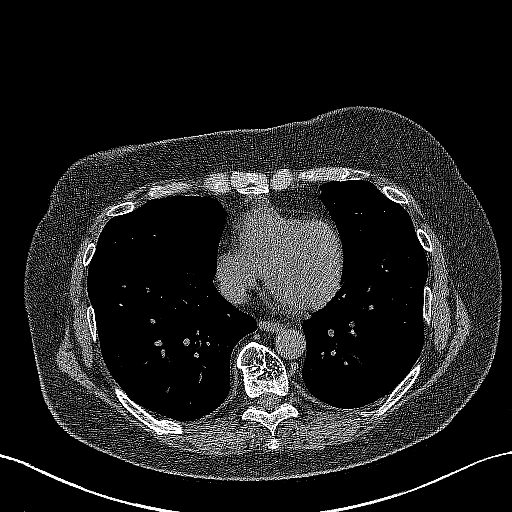
[im 124/324  lung]
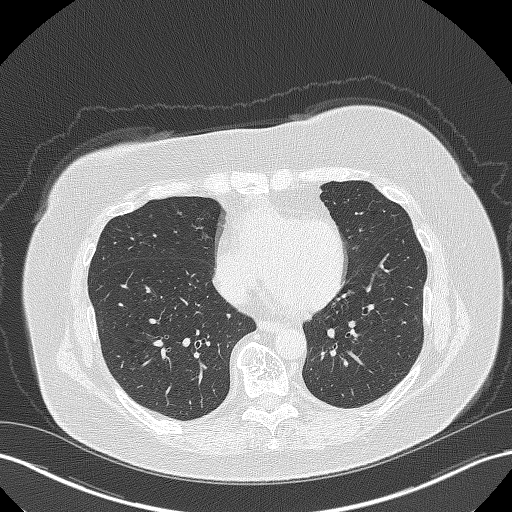
[im 154/324  lung]
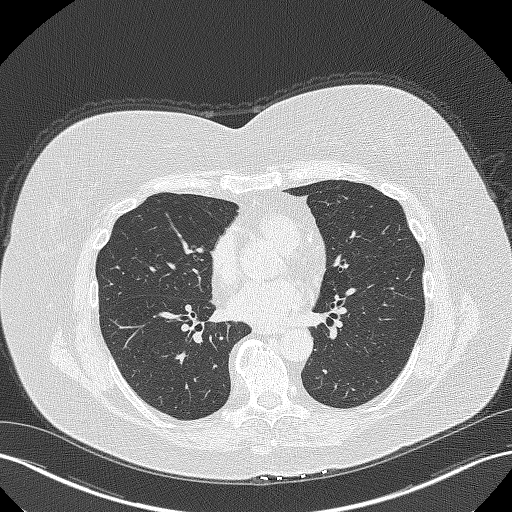
[im 170/324  lung]
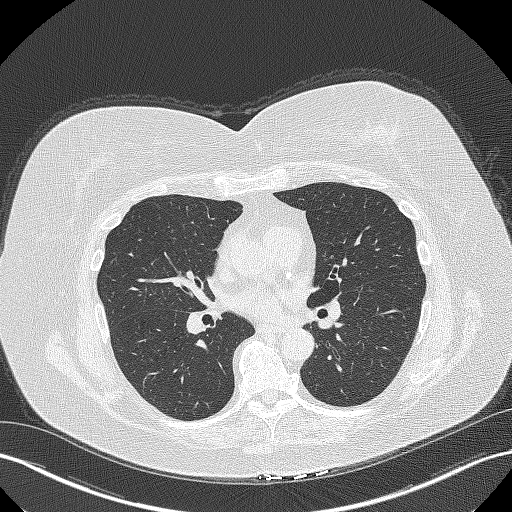
[im 200/324  lung]
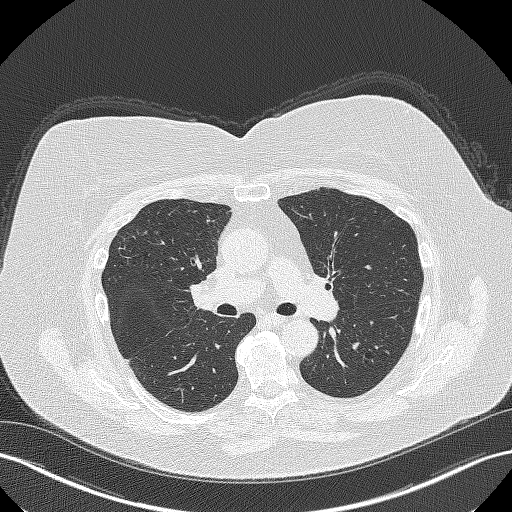
[im 231/324  mediastinal]
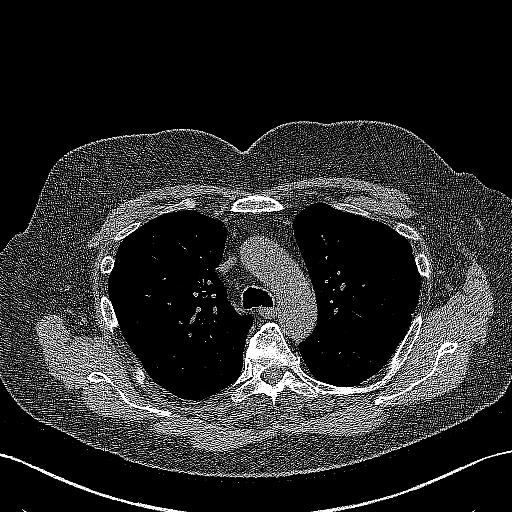
[im 231/324  lung]
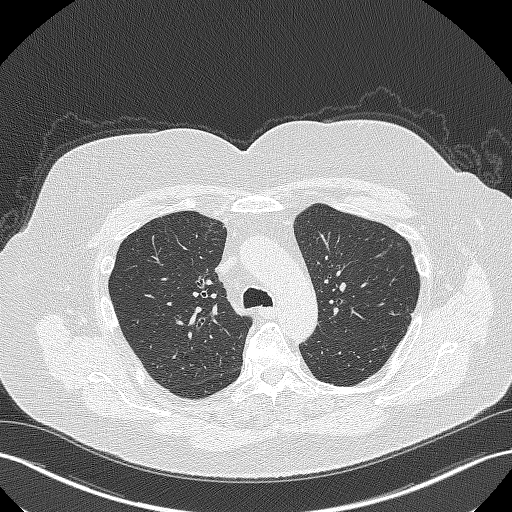
[im 247/324  lung]
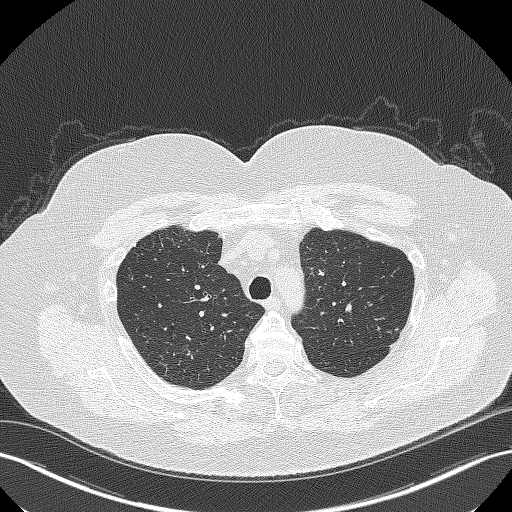
[im 277/324  lung]
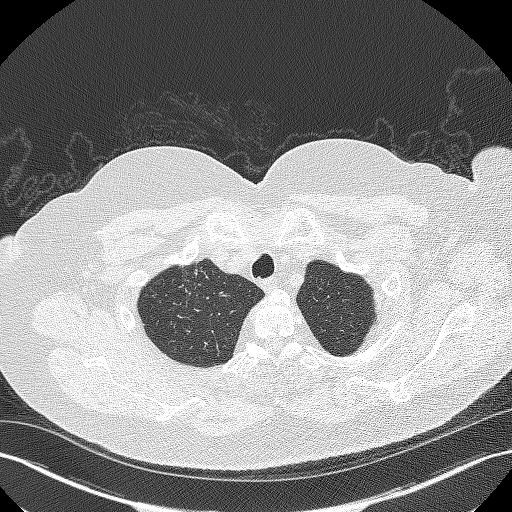
[im 308/324  lung]
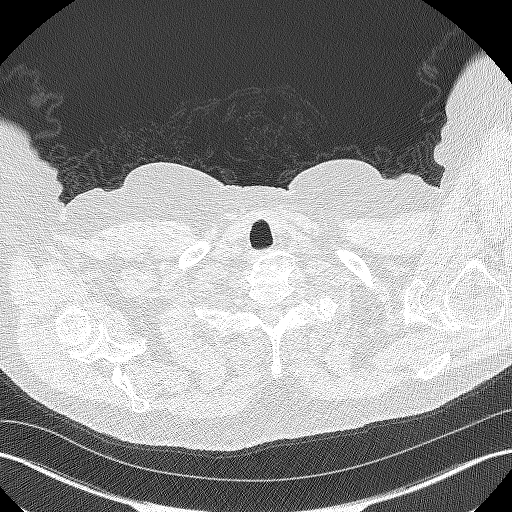

[15 of 40 positions shown; findings below may reference images not displayed]

FINDINGS: Cardiovascular: Normal heart size. No significant pericardial
effusion/thickening. Left anterior descending and right coronary
atherosclerosis. Atherosclerotic nonaneurysmal thoracic aorta.
Normal caliber pulmonary arteries.

Mediastinum/Nodes: No discrete thyroid nodules. Unremarkable
esophagus. No pathologically enlarged axillary, mediastinal or hilar
lymph nodes, noting limited sensitivity for the detection of hilar
adenopathy on this noncontrast study.

Lungs/Pleura: No pneumothorax. No pleural effusion. Mild
centrilobular emphysema with mild diffuse bronchial wall thickening.
No acute consolidative airspace disease or lung masses. Two
scattered solid small pulmonary nodules, largest 4.6 mm in volume
derived mean diameter in the left lower lobe (series 9/image 235),
stable.

Upper abdomen: No acute abnormality.

Musculoskeletal: No aggressive appearing focal osseous lesions.
Marked thoracic spondylosis. Chronic moderate L1 vertebral
compression fracture.
IMPRESSION: 1. Lung-RADS 2, benign appearance or behavior. Continue annual
screening with low-dose chest CT without contrast in 12 months.
2. Two-vessel coronary atherosclerosis.
3. Aortic Atherosclerosis ([6R]-[6R]) and Emphysema ([6R]-[6R]).

## 2020-11-01 NOTE — Progress Notes (Signed)
Shared Decision Making Visit Lung Cancer Screening Program [phone])   Eligibility:  Age 70 y.o.  Pack Years Smoking History Calculation 42 pack year smoking history (# packs/per year x # years smoked)  Recent History of coughing up blood  no  Unexplained weight loss? no ( >Than 15 pounds within the last 6 months )  Prior History Lung / other cancer no (Diagnosis within the last 5 years already requiring surveillance chest CT Scans).  Smoking Status Former Smoker  Former Smokers: Years since quit: < 1 year  Quit Date: 05/2020  Visit Components:  Discussion included one or more decision making aids. yes  Discussion included risk/benefits of screening. yes  Discussion included potential follow up diagnostic testing for abnormal scans. yes  Discussion included meaning and risk of over diagnosis. yes  Discussion included meaning and risk of False Positives. yes  Discussion included meaning of total radiation exposure. yes  Counseling Included:  Importance of adherence to annual lung cancer LDCT screening. yes  Impact of comorbidities on ability to participate in the program. yes  Ability and willingness to under diagnostic treatment. yes  Smoking Cessation Counseling:  Current Smokers:   Discussed importance of smoking cessation. yes  Information about tobacco cessation classes and interventions provided to patient. yes  Patient provided with "ticket" for LDCT Scan. yes  Symptomatic Patient. no  Counseling  Diagnosis Code: Tobacco Use Z72.0  Asymptomatic Patient yes  Counseling (Intermediate counseling: > three minutes counseling) [id number]  Former Smokers:   Discussed the importance of maintaining cigarette abstinence. yes  Diagnosis Code: Personal History of Nicotine Dependence. K99.833  Information about tobacco cessation classes and interventions provided to patient. Yes  Patient provided with "ticket" for LDCT Scan. yes  Written Order for Lung  Cancer Screening with LDCT placed in Epic. Yes (CT Chest Lung Cancer Screening Low Dose W/O CM) [id number] Z12.2-Screening of respiratory organs Z87.891-Personal history of nicotine dependence  I spent 25 minutes of face to face time with Judy [name]  discussing the risks and benefits of lung cancer screening. We viewed a power point together that explained in detail the above noted topics. We took the time to pause the power point at intervals to allow for questions to be asked and answered to ensure understanding. We discussed that she had taken the single most powerful action possible to decrease her risk of developing lung cancer when she quit smoking. I counseled her to remain smoke free, and to contact me if she ever had the desire to smoke again so that I can provide resources and tools to help support the effort to remain smoke free. We discussed the time and location of the scan, and that either  Judy Miyamoto RN or I will call with the results within  24-48 hours of receiving them. She has my card and contact information in the event she needs to speak with me, in addition to a copy of the power point we reviewed as a resource. She  verbalized understanding of all of the above and had no further questions upon leaving the office.     I explained to the patient that there has been a high incidence of coronary artery disease noted on these exams. I explained that this is a non-gated exam therefore degree or severity cannot be determined. This patient is currently on statin therapy. I have asked the patient to follow-up with their PCP regarding any incidental finding of coronary artery disease and management with diet or medication as they  feel is clinically indicated. The patient verbalized understanding of the above and had no further questions.     Bevelyn Ngo, NP 11/01/2020 2:35 PM

## 2020-11-01 NOTE — Patient Instructions (Signed)
Thank you for participating in the [facility]. It was our pleasure to meet you today. We will call you with the results of your scan within the next few days. Your scan will be assigned a Lung RADS category score by the physicians reading the scans.  This Lung RADS score determines follow up scanning.  See below for description of categories, and follow up screening recommendations. We will be in touch to schedule your follow up screening annually or based on recommendations of our providers. We will fax a copy of your scan results to your Primary Care Physician, or the physician who referred you to the program, to ensure they have the results. Please call the office if you have any questions or concerns regarding your scanning experience or results.  Our office number is [phone]. Please speak with Denise Phelps, RN. She is our Lung Cancer Screening RN. If she is unavailable when you call, please have the office staff send her a message. She will return your call at her earliest convenience. Remember, if your scan is normal, we will scan you annually as long as you continue to meet the criteria for the program. (Age 55-77, Current smoker or smoker who has quit within the last 15 years). If you are a smoker, remember, quitting is the single most powerful action that you can take to decrease your risk of lung cancer and other pulmonary, breathing related problems. We know quitting is hard, and we are here to help.  Please let us know if there is anything we can do to help you meet your goal of quitting. If you are a former smoker, congratulations. We are proud of you! Remain smoke free! Remember you can refer friends or family members through the number above.  We will screen them to make sure they meet criteria for the program. Thank you for helping us take better care of you by participating in Lung Screening.  Lung RADS Categories:  Lung RADS 1: no nodules  or definitely non-concerning nodules.  Recommendation is for a repeat annual scan in 12 months.  Lung RADS 2:  nodules that are non-concerning in appearance and behavior with a very low likelihood of becoming an active cancer. Recommendation is for a repeat annual scan in 12 months.  Lung RADS 3: nodules that are probably non-concerning , includes nodules with a low likelihood of becoming an active cancer.  Recommendation is for a 6-month repeat screening scan. Often noted after an upper respiratory illness. We will be in touch to make sure you have no questions, and to schedule your 6-month scan.  Lung RADS 4 A: nodules with concerning findings, recommendation is most often for a follow up scan in 3 months or additional testing based on our provider's assessment of the scan. We will be in touch to make sure you have no questions and to schedule the recommended 3 month follow up scan.  Lung RADS 4 B:  indicates findings that are concerning. We will be in touch with you to schedule additional diagnostic testing based on our provider's  assessment of the scan.   

## 2020-11-09 ENCOUNTER — Telehealth: Payer: Self-pay | Admitting: *Deleted

## 2020-11-09 MED ORDER — VITAMIN D (ERGOCALCIFEROL) 1.25 MG (50000 UNIT) PO CAPS: 50000.0000 [IU] | ORAL_CAPSULE | ORAL | 0 refills | Status: DC

## 2020-11-09 NOTE — Progress Notes (Signed)
Please call patient and let them  know their  low dose Ct was read as a Lung RADS 2: nodules that are benign in appearance and behavior with a very low likelihood of becoming a clinically active cancer due to size or lack of growth. Recommendation per radiology is for a repeat LDCT in 12 months. .Please let them  know we will order and schedule their  annual screening scan for 10/2021. Please let them  know there was notation of CAD on their  scan.  Please remind the patient  that this is a non-gated exam therefore degree or severity of disease  cannot be determined. Please have them  follow up with their PCP regarding potential risk factor modification, dietary therapy or pharmacologic therapy if clinically indicated. Pt.  is  currently on statin therapy. Please place order for annual  screening scan for  10/2021 and fax results to PCP. Thanks so much.  Angelique Blonder, this patient is on statin therapy, but there is notation of 2 vessel coronary atherosclerosis. I don't see cards following through [facility]. Pleasehave the patient follow up with her PCP . Thanks so much.

## 2020-11-09 NOTE — Telephone Encounter (Signed)
-----   Message from Wynn Banker, MD sent at 11/07/2020  6:13 PM EST ----- If still having nose bleeds; please refer to ENT.

## 2020-11-09 NOTE — Telephone Encounter (Signed)
Spoke with the pt and she stated she has not had any nose bleeds since the last visit.

## 2020-11-09 NOTE — Addendum Note (Signed)
Addended by: Johnella Moloney on: 11/09/2020 09:01 AM   Modules accepted: Orders

## 2020-11-10 NOTE — Telephone Encounter (Signed)
Noted  

## 2020-11-11 ENCOUNTER — Other Ambulatory Visit: Payer: Self-pay | Admitting: Family Medicine

## 2020-11-13 NOTE — Addendum Note (Signed)
Addended by: Johnella Moloney on: 11/13/2020 03:03 PM   Modules accepted: Orders

## 2020-11-15 ENCOUNTER — Telehealth: Payer: Self-pay | Admitting: Acute Care

## 2020-11-15 DIAGNOSIS — Z87891 Personal history of nicotine dependence: Secondary | ICD-10-CM

## 2020-11-15 NOTE — Telephone Encounter (Signed)
Pt informed of CT results per Sarah Groce, NP.  PT verbalized understanding.  Copy sent to PCP.  Order placed for 1 yr f/u CT.  

## 2020-11-15 NOTE — Telephone Encounter (Signed)
Patient returned JoAnne's call 

## 2020-11-16 NOTE — Telephone Encounter (Signed)
Left a detailed message at the pts cell number stating I do recall a message for her other than the results notes that were discussed with the need for a dermatology referral.  Requested pt call back with any further questions or concerns.

## 2020-11-17 NOTE — Progress Notes (Signed)
Office Visit Note  [name]             Date of Birth: 02-26-1951           MRN: [id number]             PCP: Wynn Banker, MD Referring: Wynn Banker, MD Visit Date: 11/30/2020 Occupation: @GUAROCC @  Subjective:  Medication monitoring   History of Present Illness: Judy [name] is a 70 y.o. female with history of ankylosing spondylitis, degenerative disc disease and osteoarthritis.  She states she has been tolerating Cimzia well.  She has noticed improvement in the morning stiffness and some SI joint discomfort.  She has been experiencing some cramps in her hands.  She also developed diarrhea after eating cashew nuts.  She states her shortness of breath is a stable and she has been followed by Dr. Isaiah Serge.  Activities of Daily Living:  Patient reports morning stiffness for 5 minutes.   Patient Reports nocturnal pain.  Difficulty dressing/grooming: Reports Difficulty climbing stairs: Reports Difficulty getting out of chair: Reports Difficulty using hands for taps, buttons, cutlery, and/or writing: Denies  Review of Systems  Constitutional: Positive for fatigue. Negative for night sweats, weight gain and weight loss.  HENT: Positive for nosebleeds. Negative for mouth sores, trouble swallowing, trouble swallowing, mouth dryness and nose dryness.   Eyes: Negative for pain, redness, itching, visual disturbance and dryness.  Respiratory: Positive for shortness of breath and difficulty breathing. Negative for cough and wheezing.   Cardiovascular: Negative for chest pain, palpitations, hypertension, irregular heartbeat and swelling in legs/feet.  Gastrointestinal: Positive for diarrhea. Negative for blood in stool and constipation.  Endocrine: Negative for increased urination.  Genitourinary: Negative for difficulty urinating and vaginal dryness.  Musculoskeletal: Positive for arthralgias, joint pain, myalgias, morning stiffness, muscle tenderness and  myalgias. Negative for joint swelling and muscle weakness.  Skin: Negative for color change, rash, hair loss, redness, skin tightness, ulcers and sensitivity to sunlight.  Allergic/Immunologic: Negative for susceptible to infections.  Neurological: Negative for dizziness, numbness, headaches, memory loss, night sweats and weakness.  Hematological: Negative for bruising/bleeding tendency and swollen glands.  Psychiatric/Behavioral: Positive for sleep disturbance. Negative for depressed mood and confusion. The patient is not nervous/anxious.     PMFS History:  Patient Active Problem List   Diagnosis Date Noted  . 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) 11/30/2020  . 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  . Status post total replacement of left hip 03/19/2019  . Avascular necrosis of bone of hip, 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  . PTSD (post-traumatic stress disorder)   . COPD (chronic obstructive pulmonary disease) (HCC)   . Bipolar 1 disorder (HCC)   . Anxiety   . B12 deficiency 12/09/2018  . COPD with acute exacerbation (HCC) 04/14/2017  . Hyperglycemia 04/14/2017    Past Medical History:  Diagnosis Date  . Anxiety   . Arthritis   . Avascular necrosis (HCC)    Left hip  . Bipolar 1 disorder (HCC)   . COPD (chronic obstructive pulmonary disease) (HCC)   . Pneumonia   . PTSD (post-traumatic stress disorder)     Family History  Problem Relation Age of Onset  . CAD Mother 68  . COPD Mother   . Depression Mother   . Heart attack Father   . Colon polyps Sister   . Heart attack Maternal Grandmother   .  Early death Maternal Grandfather   . Hearing loss Paternal Grandfather   . Bipolar disorder Son   . Asthma Daughter    Past Surgical History:  Procedure Laterality Date  . bone spurs foot Right   . COLONOSCOPY    . HAND SURGERY Left   . JOINT REPLACEMENT    . TONSILLECTOMY    .  TONSILLECTOMY AND ADENOIDECTOMY    . TOTAL HIP ARTHROPLASTY Left 03/19/2019   Procedure: LEFT TOTAL HIP ARTHROPLASTY ANTERIOR APPROACH;  Surgeon: [name], MD;  Location: [facility];  Service: Orthopedics;  Laterality: Left;  . TOTAL HIP ARTHROPLASTY Right 06/04/2019   Procedure: RIGHT TOTAL HIP ARTHROPLASTY ANTERIOR APPROACH;  Surgeon: [name], MD;  Location: [facility];  Service: Orthopedics;  Laterality: Right;   Social History   Social History Narrative  . Not on file   Immunization History  Administered Date(s) Administered  . Influenza, High Dose Seasonal PF 08/25/2018  . Moderna Sars-Covid-2 Vaccination 11/08/2019, 12/05/2019, 10/09/2020  . Pneumococcal Conjugate-13 09/23/2018  . Pneumococcal Polysaccharide-23 08/27/2016  . Tdap 10/07/2012  . Yellow Fever 07/05/2008     Objective: Vital Signs: BP 110/72 (BP Location: Left Arm, Patient Position: Sitting, Cuff Size: Normal)   Pulse 73   Resp 18   Ht 5\' 10"  (1.778 m)   Wt 218 lb (98.9 kg)   BMI 31.28 kg/m    Physical Exam Vitals and nursing note reviewed.  Constitutional:      Appearance: She is well-developed and well-nourished.  HENT:     Head: Normocephalic and atraumatic.  Eyes:     Extraocular Movements: EOM normal.     Conjunctiva/sclera: Conjunctivae normal.  Cardiovascular:     Rate and Rhythm: Normal rate and regular rhythm.     Pulses: Intact distal pulses.     Heart sounds: Normal heart sounds.  Pulmonary:     Effort: Pulmonary effort is normal.     Breath sounds: Normal breath sounds.  Abdominal:     General: Bowel sounds are normal.     Palpations: Abdomen is soft.  Musculoskeletal:     Cervical back: Normal range of motion.  Lymphadenopathy:     Cervical: No cervical adenopathy.  Skin:    General: Skin is warm and dry.     Capillary Refill: Capillary refill takes less than 2 seconds.  Neurological:     Mental Status: She is alert and oriented to person, place, and time.   Psychiatric:        Mood and Affect: Mood and affect normal.        Behavior: Behavior normal.      Musculoskeletal Exam: She has limited range of motion of her cervical spine.  She has limited range of motion for lumbar spine.  She had no tenderness over lumbar spine or SI joints.  She has good range of motion of her shoulder joints.  She has elbow joint contractures which are improved.  Wrist joints MCPs and PIPs were in good range of motion.  PIP and DIP thickening was noted.  She had good range of motion of her hip joints and knee joints.  Her bilateral hip joints are replaced.  CDAI Exam: CDAI Score: - Patient Global: -; Provider Global: - Swollen: -; Tender: - Joint Exam 11/30/2020   No joint exam has been documented for this visit   There is currently no information documented on the homunculus. Go to the Rheumatology activity and complete the homunculus joint exam.  Investigation: No  additional findings.  Imaging: CT CHEST LUNG CA SCREEN LOW DOSE W/O CM  Result Date: 11/01/2020 CLINICAL DATA:  70 year old asymptomatic female former smoker with 42 pack-year smoking history, quit smoking 5 months prior. EXAM: CT CHEST WITHOUT CONTRAST LOW-DOSE FOR LUNG CANCER SCREENING TECHNIQUE: Multidetector CT imaging of the chest was performed following the standard protocol without IV contrast. COMPARISON:  08/14/2018 chest CT. FINDINGS: Cardiovascular: Normal heart size. No significant pericardial effusion/thickening. Left anterior descending and right coronary atherosclerosis. Atherosclerotic nonaneurysmal thoracic aorta. Normal caliber pulmonary arteries. Mediastinum/Nodes: No discrete thyroid nodules. Unremarkable esophagus. No pathologically enlarged axillary, mediastinal or hilar lymph nodes, noting limited sensitivity for the detection of hilar adenopathy on this noncontrast study. Lungs/Pleura: No pneumothorax. No pleural effusion. Mild centrilobular emphysema with mild diffuse bronchial  wall thickening. No acute consolidative airspace disease or lung masses. Two scattered solid small pulmonary nodules, largest 4.6 mm in volume derived mean diameter in the left lower lobe (series 9/image 235), stable. Upper abdomen: No acute abnormality. Musculoskeletal: No aggressive appearing focal osseous lesions. Marked thoracic spondylosis. Chronic moderate L1 vertebral compression fracture. IMPRESSION: 1. Lung-RADS 2, benign appearance or behavior. Continue annual screening with low-dose chest CT without contrast in 12 months. 2. Two-vessel coronary atherosclerosis. 3. Aortic Atherosclerosis (ICD10-I70.0) and Emphysema (ICD10-J43.9). Electronically Signed   By: Delbert Phenix M.D.   On: 11/01/2020 17:33    Recent Labs: Lab Results  Component Value Date   WBC 6.0 10/30/2020   HGB 12.9 10/30/2020   PLT 212.0 10/30/2020   NA 140 10/30/2020   K 4.1 10/30/2020   CL 106 10/30/2020   CO2 28 10/30/2020   GLUCOSE 95 10/30/2020   BUN 13 10/30/2020   CREATININE 0.99 10/30/2020   BILITOT 0.3 10/30/2020   ALKPHOS 144 (H) 10/30/2020   AST 11 10/30/2020   ALT 15 10/30/2020   PROT 6.5 10/30/2020   ALBUMIN 4.2 10/30/2020   CALCIUM 9.5 10/30/2020   GFRAA 73 09/05/2020   QFTBGOLDPLUS NEGATIVE 04/26/2020    Speciality Comments: Cimzia completed loading on June 22, 2020  Procedures:  No procedures performed Allergies: Celexa [citalopram hydrobromide] and Gabapentin   Assessment / Plan:     Visit Diagnoses: 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) - C-spine, thoracic spine, and lumbar spine fused: She has noticed improvement and morning stiffness and some improvement in the SI joint pain.  Although she continues to have some lower back pain due to underlying disc disease.  She has been taking Cimzia injections every 4 weeks which has been tolerating well.  High risk medication use - Cimzia 400 mg sq injections every 4 weeks-Administered in the office.  Labs obtained in January  were within normal limits.  We will continue to monitor labs every 3 months.  TB Gold on April 26, 2020 was negative.  DDD (degenerative disc disease), lumbar-she continues to have lower back pain and discomfort.  Spinal stenosis of lumbosacral region  Chronic SI joint pain-improved on Cimzia.  Status post total replacement of both hips-she had AVN in bilateral hip joints and required replacement.  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  Right elbow joint contractures appears better.  Bipolar 1 disorder (HCC)  PTSD (post-traumatic stress disorder)  B12 deficiency  History of COPD-she is followed by Dr. Isaiah Serge.  She states her pulse ox is about 97%.  She has shortness of  breath and doing routine activities.  Pulmonary nodules - Followed by with Dr. Isaiah Serge  Former smoker  Educated but COVID-19 virus infection-she is fully vaccinated against COVID-19 and received a booster.  Use of mask, social distancing and hand hygiene was discussed.  Orders: No orders of the defined types were placed in this encounter.  No orders of the defined types were placed in this encounter.     Follow-Up Instructions: Return in about 3 months (around 02/27/2021) for Ankylosing spondylitis.   Pollyann Savoy, MD  Note - This record has been created using Animal nutritionist.  Chart creation errors have been sought, but may not always  have been located. Such creation errors do not reflect on  the standard of medical care.

## 2020-11-20 ENCOUNTER — Ambulatory Visit (INDEPENDENT_AMBULATORY_CARE_PROVIDER_SITE_OTHER): Payer: Medicare Other | Admitting: *Deleted

## 2020-11-20 ENCOUNTER — Other Ambulatory Visit: Payer: Self-pay

## 2020-11-20 VITALS — BP 139/77 | HR 89

## 2020-11-20 DIAGNOSIS — M45 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: Secondary | ICD-10-CM | POA: Diagnosis not present

## 2020-11-20 MED ORDER — CERTOLIZUMAB PEGOL 2 X 200 MG ~~LOC~~ KIT
400.0000 mg | PACK | Freq: Once | SUBCUTANEOUS | Status: AC
  Administered 2020-11-20: 400 mg via SUBCUTANEOUS

## 2020-11-20 NOTE — Progress Notes (Signed)
Pharmacy Note  Subjective:   Patient presents to clinic today to receive monthly dose of Cimzia.  Patient running a fever or have signs/symptoms of infection? No  Patient currently on antibiotics for the treatment of infection? No  Patient have any upcoming invasive procedures/surgeries? No  Objective: CMP     Component Value Date/Time   NA 140 10/30/2020 1227   K 4.1 10/30/2020 1227   CL 106 10/30/2020 1227   CO2 28 10/30/2020 1227   GLUCOSE 95 10/30/2020 1227   BUN 13 10/30/2020 1227   CREATININE 0.99 10/30/2020 1227   CREATININE 0.93 09/05/2020 1508   CALCIUM 9.5 10/30/2020 1227   PROT 6.5 10/30/2020 1227   ALBUMIN 4.2 10/30/2020 1227   AST 11 10/30/2020 1227   ALT 15 10/30/2020 1227   ALKPHOS 144 (H) 10/30/2020 1227   BILITOT 0.3 10/30/2020 1227   GFRNONAA 63 09/05/2020 1508   GFRAA 73 09/05/2020 1508    CBC    Component Value Date/Time   WBC 6.0 10/30/2020 1227   RBC 4.37 10/30/2020 1227   HGB 12.9 10/30/2020 1227   HCT 39.1 10/30/2020 1227   PLT 212.0 10/30/2020 1227   MCV 89.5 10/30/2020 1227   MCH 30.8 09/05/2020 1508   MCHC 32.9 10/30/2020 1227   RDW 13.3 10/30/2020 1227   LYMPHSABS 1.9 10/30/2020 1227   MONOABS 0.4 10/30/2020 1227   EOSABS 0.1 10/30/2020 1227   BASOSABS 0.0 10/30/2020 1227    Baseline Immunosuppressant Therapy Labs TB GOLD Quantiferon TB Gold Latest Ref Rng & Units 04/26/2020  Quantiferon TB Gold Plus NEGATIVE NEGATIVE   Hepatitis Panel Hepatitis Latest Ref Rng & Units 04/26/2020  Hep B Surface Ag NON-REACTI NON-REACTIVE  Hep B IgM NON-REACTI NON-REACTIVE  Hep C Ab NON-REACTI -  Hep C Ab NON-REACTI -   HIV Lab Results  Component Value Date   HIV NON-REACTIVE 04/26/2020   HIV Non Reactive 08/23/2017   HIV Non Reactive 04/14/2017   Immunoglobulins Immunoglobulin Electrophoresis Latest Ref Rng & Units 04/26/2020  IgA  70 - 320 mg/dL 120  IgG 600 - 1,540 mg/dL 824  IgM 50 - 300 mg/dL 47(L)   SPEP Serum Protein  Electrophoresis Latest Ref Rng & Units 10/30/2020  Total Protein 6.0 - 8.3 g/dL 6.5  Albumin 3.8 - 4.8 g/dL -  Alpha-1 0.2 - 0.3 g/dL -  Alpha-2 0.5 - 0.9 g/dL -  Beta Globulin 0.4 - 0.6 g/dL -  Beta 2 0.2 - 0.5 g/dL -  Gamma Globulin 0.8 - 1.7 g/dL -   G6PD No results found for: G6PDH TPMT No results found for: TPMT   Chest x-ray: No active cardiopulmonary disease.08/22/2017  Assessment/Plan:   Administrations This Visit    Certolizumab Pegol KIT 400 mg    Admin Date 11/20/2020 Action Given Dose 400 mg Route Subcutaneous Administered By Carole Binning, LPN          Patient tolerated injection well.   Appointment for next injection scheduled for 12/18/2020.  Patient due for labs in April 2022.  Patient is to call and reschedule appointment if running a fever with signs/symptoms of infection, on antibiotics for active infection or has an upcoming invasive procedure.  All questions encouraged and answered.  Instructed patient to call with any further questions or concerns.

## 2020-11-30 ENCOUNTER — Encounter: Payer: Self-pay | Admitting: Rheumatology

## 2020-11-30 ENCOUNTER — Other Ambulatory Visit: Payer: Self-pay

## 2020-11-30 ENCOUNTER — Ambulatory Visit (INDEPENDENT_AMBULATORY_CARE_PROVIDER_SITE_OTHER): Payer: Medicare Other | Admitting: Rheumatology

## 2020-11-30 VITALS — BP 110/72 | HR 73 | Resp 18 | Ht 70.0 in | Wt 218.0 lb

## 2020-11-30 DIAGNOSIS — Z96642 Presence of left artificial hip joint: Secondary | ICD-10-CM

## 2020-11-30 DIAGNOSIS — M4807 Spinal stenosis, lumbosacral region: Secondary | ICD-10-CM

## 2020-11-30 DIAGNOSIS — Z87891 Personal history of nicotine dependence: Secondary | ICD-10-CM

## 2020-11-30 DIAGNOSIS — G8929 Other chronic pain: Secondary | ICD-10-CM

## 2020-11-30 DIAGNOSIS — M24521 Contracture, right elbow: Secondary | ICD-10-CM

## 2020-11-30 DIAGNOSIS — F4310 Post-traumatic stress disorder, unspecified: Secondary | ICD-10-CM

## 2020-11-30 DIAGNOSIS — M5136 Other intervertebral disc degeneration, lumbar region: Secondary | ICD-10-CM | POA: Diagnosis not present

## 2020-11-30 DIAGNOSIS — E538 Deficiency of other specified B group vitamins: Secondary | ICD-10-CM

## 2020-11-30 DIAGNOSIS — M87051 Idiopathic aseptic necrosis of right femur: Secondary | ICD-10-CM

## 2020-11-30 DIAGNOSIS — Z96641 Presence of right artificial hip joint: Secondary | ICD-10-CM

## 2020-11-30 DIAGNOSIS — R918 Other nonspecific abnormal finding of lung field: Secondary | ICD-10-CM

## 2020-11-30 DIAGNOSIS — Z96643 Presence of artificial hip joint, bilateral: Secondary | ICD-10-CM

## 2020-11-30 DIAGNOSIS — Z79899 Other long term (current) drug therapy: Secondary | ICD-10-CM

## 2020-11-30 DIAGNOSIS — Z8709 Personal history of other diseases of the respiratory system: Secondary | ICD-10-CM

## 2020-11-30 DIAGNOSIS — M24522 Contracture, left elbow: Secondary | ICD-10-CM

## 2020-11-30 DIAGNOSIS — M533 Sacrococcygeal disorders, not elsewhere classified: Secondary | ICD-10-CM

## 2020-11-30 DIAGNOSIS — F319 Bipolar disorder, unspecified: Secondary | ICD-10-CM

## 2020-11-30 DIAGNOSIS — M87052 Idiopathic aseptic necrosis of left femur: Secondary | ICD-10-CM

## 2020-11-30 DIAGNOSIS — M45 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: Secondary | ICD-10-CM | POA: Diagnosis not present

## 2020-11-30 DIAGNOSIS — Z7189 Other specified counseling: Secondary | ICD-10-CM

## 2020-11-30 NOTE — Patient Instructions (Signed)
Standing Labs We placed an order today for your standing lab work.   Please have your standing labs drawn in April and every 3 months   If possible, please have your labs drawn 2 weeks prior to your appointment so that the provider can discuss your results at your appointment.  We have open lab daily Monday through Thursday from 1:30-4:30 PM and Friday from 1:30-4:00 PM at the office of Dr. Jehan Ranganathan, [facility].   Please be advised, all patients with office appointments requiring lab work will take precedents over walk-in lab work.  If possible, please come for your lab work on Monday and Friday afternoons, as you may experience shorter wait times. The office is located at [address], [address], [place], [place] [address] No appointment is necessary.   Labs are drawn by [facility]. Please bring your co-pay at the time of your lab draw.  You may receive a bill from [facility] for your lab work.  If you wish to have your labs drawn at another location, please call the office 24 hours in advance to send orders.  If you have any questions regarding directions or hours of operation,  please call [phone].   As a reminder, please drink plenty of water prior to coming for your lab work. Thanks!   

## 2020-12-18 ENCOUNTER — Other Ambulatory Visit: Payer: Self-pay

## 2020-12-18 ENCOUNTER — Ambulatory Visit (INDEPENDENT_AMBULATORY_CARE_PROVIDER_SITE_OTHER): Payer: Medicare Other

## 2020-12-18 VITALS — BP 140/77 | HR 94

## 2020-12-18 DIAGNOSIS — M45 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: Secondary | ICD-10-CM | POA: Diagnosis not present

## 2020-12-18 MED ORDER — CERTOLIZUMAB PEGOL 2 X 200 MG ~~LOC~~ KIT
400.0000 mg | PACK | Freq: Once | SUBCUTANEOUS | Status: AC
  Administered 2020-12-18: 400 mg via SUBCUTANEOUS

## 2020-12-18 NOTE — Progress Notes (Signed)
Pharmacy Note  Subjective:   Patient presents to clinic today to receive monthly dose of Cimzia.  Patient running a fever or have signs/symptoms of infection? No  Patient currently on antibiotics for the treatment of infection? No  Patient have any upcoming invasive procedures/surgeries? No  Objective: CMP     Component Value Date/Time   NA 140 10/30/2020 1227   K 4.1 10/30/2020 1227   CL 106 10/30/2020 1227   CO2 28 10/30/2020 1227   GLUCOSE 95 10/30/2020 1227   BUN 13 10/30/2020 1227   CREATININE 0.99 10/30/2020 1227   CREATININE 0.93 09/05/2020 1508   CALCIUM 9.5 10/30/2020 1227   PROT 6.5 10/30/2020 1227   ALBUMIN 4.2 10/30/2020 1227   AST 11 10/30/2020 1227   ALT 15 10/30/2020 1227   ALKPHOS 144 (H) 10/30/2020 1227   BILITOT 0.3 10/30/2020 1227   GFRNONAA 63 09/05/2020 1508   GFRAA 73 09/05/2020 1508    CBC    Component Value Date/Time   WBC 6.0 10/30/2020 1227   RBC 4.37 10/30/2020 1227   HGB 12.9 10/30/2020 1227   HCT 39.1 10/30/2020 1227   PLT 212.0 10/30/2020 1227   MCV 89.5 10/30/2020 1227   MCH 30.8 09/05/2020 1508   MCHC 32.9 10/30/2020 1227   RDW 13.3 10/30/2020 1227   LYMPHSABS 1.9 10/30/2020 1227   MONOABS 0.4 10/30/2020 1227   EOSABS 0.1 10/30/2020 1227   BASOSABS 0.0 10/30/2020 1227    Baseline Immunosuppressant Therapy Labs TB GOLD Quantiferon TB Gold Latest Ref Rng & Units 04/26/2020  Quantiferon TB Gold Plus NEGATIVE NEGATIVE   Hepatitis Panel Hepatitis Latest Ref Rng & Units 04/26/2020  Hep B Surface Ag NON-REACTI NON-REACTIVE  Hep B IgM NON-REACTI NON-REACTIVE  Hep C Ab NON-REACTI -  Hep C Ab NON-REACTI -   HIV Lab Results  Component Value Date   HIV NON-REACTIVE 04/26/2020   HIV Non Reactive 08/23/2017   HIV Non Reactive 04/14/2017   Immunoglobulins Immunoglobulin Electrophoresis Latest Ref Rng & Units 04/26/2020  IgA  70 - 320 mg/dL 120  IgG 600 - 1,540 mg/dL 824  IgM 50 - 300 mg/dL 47(L)   SPEP Serum Protein  Electrophoresis Latest Ref Rng & Units 10/30/2020  Total Protein 6.0 - 8.3 g/dL 6.5  Albumin 3.8 - 4.8 g/dL -  Alpha-1 0.2 - 0.3 g/dL -  Alpha-2 0.5 - 0.9 g/dL -  Beta Globulin 0.4 - 0.6 g/dL -  Beta 2 0.2 - 0.5 g/dL -  Gamma Globulin 0.8 - 1.7 g/dL -   G6PD No results found for: G6PDH TPMT No results found for: TPMT   Chest x-ray: 08/22/2017  Assessment/Plan:  Administrations This Visit    Certolizumab Pegol KIT 400 mg    Admin Date 12/18/2020 Action Given Dose 400 mg Route Subcutaneous Administered By Earnestine Mealing, CMA         Patient tolerated injection well.   Patient is scheduled for next injection on 01/15/2021. Patient is due to update labs in April 2022. Patient is to call the office and reschedule appointment if running a fever with any signs/symptoms of infection, on antibiotics for an active infection or has an upcoming invasive procedure.  All questions encouraged and answered.  Instructed patient to call with any further questions or concerns.

## 2021-01-15 ENCOUNTER — Ambulatory Visit (INDEPENDENT_AMBULATORY_CARE_PROVIDER_SITE_OTHER): Payer: Medicare Other

## 2021-01-15 ENCOUNTER — Telehealth: Payer: Self-pay | Admitting: Family Medicine

## 2021-01-15 ENCOUNTER — Other Ambulatory Visit (INDEPENDENT_AMBULATORY_CARE_PROVIDER_SITE_OTHER): Payer: Medicare Other

## 2021-01-15 ENCOUNTER — Other Ambulatory Visit: Payer: Self-pay

## 2021-01-15 ENCOUNTER — Other Ambulatory Visit: Payer: Self-pay | Admitting: Family Medicine

## 2021-01-15 VITALS — BP 111/73 | HR 90

## 2021-01-15 DIAGNOSIS — E538 Deficiency of other specified B group vitamins: Secondary | ICD-10-CM

## 2021-01-15 DIAGNOSIS — E559 Vitamin D deficiency, unspecified: Secondary | ICD-10-CM | POA: Diagnosis not present

## 2021-01-15 DIAGNOSIS — Z79899 Other long term (current) drug therapy: Secondary | ICD-10-CM | POA: Diagnosis not present

## 2021-01-15 LAB — FOLATE: Folate: 14.9 ng/mL (ref >5.9)

## 2021-01-15 LAB — VITAMIN D 25 HYDROXY (VIT D DEFICIENCY, FRACTURES): VITD: 23.25 ng/mL — ABNORMAL LOW (ref 30.00–100.00)

## 2021-01-15 LAB — VITAMIN B12: Vitamin B-12: 767 pg/mL (ref 211–911)

## 2021-01-15 MED ORDER — CERTOLIZUMAB PEGOL 2 X 200 MG ~~LOC~~ KIT
400.0000 mg | PACK | Freq: Once | SUBCUTANEOUS | Status: AC
  Administered 2021-01-15: 400 mg via SUBCUTANEOUS

## 2021-01-15 NOTE — Telephone Encounter (Signed)
Patient would like Dr. Hassan Rowan to put in a referral for her for an eye doctor.  Please advise.

## 2021-01-15 NOTE — Progress Notes (Signed)
Pharmacy Note  Subjective:   Patient presents to clinic today to receive monthly dose of Cimzia.  Patient running a fever or have signs/symptoms of infection? No  Patient currently on antibiotics for the treatment of infection? No  Patient have any upcoming invasive procedures/surgeries? No  Objective: CMP     Component Value Date/Time   NA 140 10/30/2020 1227   K 4.1 10/30/2020 1227   CL 106 10/30/2020 1227   CO2 28 10/30/2020 1227   GLUCOSE 95 10/30/2020 1227   BUN 13 10/30/2020 1227   CREATININE 0.99 10/30/2020 1227   CREATININE 0.93 09/05/2020 1508   CALCIUM 9.5 10/30/2020 1227   PROT 6.5 10/30/2020 1227   ALBUMIN 4.2 10/30/2020 1227   AST 11 10/30/2020 1227   ALT 15 10/30/2020 1227   ALKPHOS 144 (H) 10/30/2020 1227   BILITOT 0.3 10/30/2020 1227   GFRNONAA 63 09/05/2020 1508   GFRAA 73 09/05/2020 1508    CBC    Component Value Date/Time   WBC 6.0 10/30/2020 1227   RBC 4.37 10/30/2020 1227   HGB 12.9 10/30/2020 1227   HCT 39.1 10/30/2020 1227   PLT 212.0 10/30/2020 1227   MCV 89.5 10/30/2020 1227   MCH 30.8 09/05/2020 1508   MCHC 32.9 10/30/2020 1227   RDW 13.3 10/30/2020 1227   LYMPHSABS 1.9 10/30/2020 1227   MONOABS 0.4 10/30/2020 1227   EOSABS 0.1 10/30/2020 1227   BASOSABS 0.0 10/30/2020 1227    Baseline Immunosuppressant Therapy Labs TB GOLD Quantiferon TB Gold Latest Ref Rng & Units 04/26/2020  Quantiferon TB Gold Plus NEGATIVE NEGATIVE   Hepatitis Panel Hepatitis Latest Ref Rng & Units 04/26/2020  Hep B Surface Ag NON-REACTI NON-REACTIVE  Hep B IgM NON-REACTI NON-REACTIVE  Hep C Ab NON-REACTI -  Hep C Ab NON-REACTI -   HIV Lab Results  Component Value Date   HIV NON-REACTIVE 04/26/2020   HIV Non Reactive 08/23/2017   HIV Non Reactive 04/14/2017   Immunoglobulins Immunoglobulin Electrophoresis Latest Ref Rng & Units 04/26/2020  IgA  70 - 320 mg/dL 120  IgG 600 - 1,540 mg/dL 824  IgM 50 - 300 mg/dL 47(L)   SPEP Serum Protein  Electrophoresis Latest Ref Rng & Units 10/30/2020  Total Protein 6.0 - 8.3 g/dL 6.5  Albumin 3.8 - 4.8 g/dL -  Alpha-1 0.2 - 0.3 g/dL -  Alpha-2 0.5 - 0.9 g/dL -  Beta Globulin 0.4 - 0.6 g/dL -  Beta 2 0.2 - 0.5 g/dL -  Gamma Globulin 0.8 - 1.7 g/dL -   G6PD No results found for: G6PDH TPMT No results found for: TPMT   Chest x-ray: 08/22/2017 No active cardiopulmonary disease.  Assessment/Plan:  Administrations This Visit    Certolizumab Pegol KIT 400 mg    Admin Date 01/15/2021 Action Given Dose 400 mg Route Subcutaneous Administered By ,  C, CMA         Patient tolerated injection well.   Patient is scheduled for next injection on 02/12/2021. Patient updated labs today in the office. Patient is to call the office and reschedule appointment if running a fever with any signs/symptoms of infection, on antibiotics for an active infection or has an upcoming invasive procedure.  All questions encouraged and answered.  Instructed patient to call with any further questions or concerns.    

## 2021-01-16 ENCOUNTER — Other Ambulatory Visit: Payer: Self-pay | Admitting: *Deleted

## 2021-01-16 ENCOUNTER — Other Ambulatory Visit: Payer: Self-pay | Admitting: Family Medicine

## 2021-01-16 DIAGNOSIS — R739 Hyperglycemia, unspecified: Secondary | ICD-10-CM

## 2021-01-16 DIAGNOSIS — E785 Hyperlipidemia, unspecified: Secondary | ICD-10-CM

## 2021-01-16 DIAGNOSIS — M45 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: Secondary | ICD-10-CM

## 2021-01-16 DIAGNOSIS — Z79899 Other long term (current) drug therapy: Secondary | ICD-10-CM

## 2021-01-16 LAB — COMPLETE METABOLIC PANEL WITH GFR
AG Ratio: 1.7 (calc) (ref 1.0–2.5)
ALT: 14 U/L (ref 6–29)
AST: 12 U/L (ref 10–35)
Albumin: 4 g/dL (ref 3.6–5.1)
Alkaline phosphatase (APISO): 161 U/L — ABNORMAL HIGH (ref 37–153)
BUN/Creatinine Ratio: 13 (calc) (ref 6–22)
BUN: 13 mg/dL (ref 7–25)
CO2: 24 mmol/L (ref 20–32)
Calcium: 9.3 mg/dL (ref 8.6–10.4)
Chloride: 110 mmol/L (ref 98–110)
Creat: 1.03 mg/dL — ABNORMAL HIGH (ref 0.50–0.99)
GFR, Est African American: 65 mL/min/{1.73_m2} (ref >=60)
GFR, Est Non African American: 56 mL/min/{1.73_m2} — ABNORMAL LOW (ref >=60)
Globulin: 2.4 g/dL (calc) (ref 1.9–3.7)
Glucose, Bld: 125 mg/dL — ABNORMAL HIGH (ref 65–99)
Potassium: 4.2 mmol/L (ref 3.5–5.3)
Sodium: 142 mmol/L (ref 135–146)
Total Bilirubin: 0.5 mg/dL (ref 0.2–1.2)
Total Protein: 6.4 g/dL (ref 6.1–8.1)

## 2021-01-16 LAB — CBC WITH DIFFERENTIAL/PLATELET
Absolute Monocytes: 348 cells/uL (ref 200–950)
Basophils Absolute: 18 cells/uL (ref 0–200)
Basophils Relative: 0.3 %
Eosinophils Absolute: 89 cells/uL (ref 15–500)
Eosinophils Relative: 1.5 %
HCT: 38.1 % (ref 35.0–45.0)
Hemoglobin: 12.4 g/dL (ref 11.7–15.5)
Lymphs Abs: 1404 cells/uL (ref 850–3900)
MCH: 28.7 pg (ref 27.0–33.0)
MCHC: 32.5 g/dL (ref 32.0–36.0)
MCV: 88.2 fL (ref 80.0–100.0)
MPV: 10 fL (ref 7.5–12.5)
Monocytes Relative: 5.9 %
Neutro Abs: 4042 cells/uL (ref 1500–7800)
Neutrophils Relative %: 68.5 %
Platelets: 238 10*3/uL (ref 140–400)
RBC: 4.32 10*6/uL (ref 3.80–5.10)
RDW: 12.9 % (ref 11.0–15.0)
Total Lymphocyte: 23.8 %
WBC: 5.9 10*3/uL (ref 3.8–10.8)

## 2021-01-16 NOTE — Telephone Encounter (Signed)
Ok. Referred to [employer]

## 2021-01-16 NOTE — Telephone Encounter (Signed)
Left a message for the pt to return my call.  

## 2021-01-16 NOTE — Progress Notes (Signed)
Glucose is 125.  Creatinine is borderline elevated and GFR is slightly low-56.  Please clarify if the patient has been taking ibuprofen sine it is listed on her medication list. Please advise the patient to try to avoid NSAIDs.   Alk phos is slightly elevated.  Please advise the patient to return in 1 month to recheck CMP.   CBC WNL.

## 2021-01-18 NOTE — Telephone Encounter (Signed)
Patient informed of the message below.

## 2021-01-24 ENCOUNTER — Other Ambulatory Visit: Payer: Self-pay

## 2021-01-24 ENCOUNTER — Ambulatory Visit (INDEPENDENT_AMBULATORY_CARE_PROVIDER_SITE_OTHER): Payer: Medicare Other

## 2021-01-24 DIAGNOSIS — Z0000 Encounter for general adult medical examination without abnormal findings: Secondary | ICD-10-CM

## 2021-01-24 NOTE — Patient Instructions (Addendum)
[name] , Thank you for taking time to come for your Medicare Wellness Visit. I appreciate your ongoing commitment to your health goals. Please review the following plan we discussed and let me know if I can assist you in the future.   Screening recommendations/referrals: Colonoscopy: Done 10/07/18 Mammogram: Done 08/21/20 Bone Density: Done 10/05/18 Recommended yearly ophthalmology/optometry visit for glaucoma screening and checkup Recommended yearly dental visit for hygiene and checkup  Vaccinations: Influenza vaccine: Declined Pneumococcal vaccine: Up to date Tdap vaccine: Up to date Shingles vaccine: Shingrix discussed. Please contact your pharmacy for coverage information.    Covid-19:Completed 2/1, 12/05/19 & , 10/09/20  Advanced directives: Advance directive discussed with you today. I have provided a copy for you to complete at home and have notarized. Once this is complete please bring a copy in to our office so we can scan it into your chart.  Conditions/risks identified: Bounce back from surgery  Next appointment: Follow up in one year for your annual wellness visit    Preventive Care 65 Years and Older, Female Preventive care refers to lifestyle choices and visits with your health care provider that can promote health and wellness. What does preventive care include?  A yearly physical exam. This is also called an annual well check.  Dental exams once or twice a year.  Routine eye exams. Ask your health care provider how often you should have your eyes checked.  Personal lifestyle choices, including:  Daily care of your teeth and gums.  Regular physical activity.  Eating a healthy diet.  Avoiding tobacco and drug use.  Limiting alcohol use.  Practicing safe sex.  Taking low-dose aspirin every day.  Taking vitamin and mineral supplements as recommended by your health care provider. What happens during an annual well check? The services and screenings done  by your health care provider during your annual well check will depend on your age, overall health, lifestyle risk factors, and family history of disease. Counseling  Your health care provider may ask you questions about your:  Alcohol use.  Tobacco use.  Drug use.  Emotional well-being.  Home and relationship well-being.  Sexual activity.  Eating habits.  History of falls.  Memory and ability to understand (cognition).  Work and work Astronomer.  Reproductive health. Screening  You may have the following tests or measurements:  Height, weight, and BMI.  Blood pressure.  Lipid and cholesterol levels. These may be checked every 5 years, or more frequently if you are over 52 years old.  Skin check.  Lung cancer screening. You may have this screening every year starting at age 40 if you have a 30-pack-year history of smoking and currently smoke or have quit within the past 15 years.  Fecal occult blood test (FOBT) of the stool. You may have this test every year starting at age 37.  Flexible sigmoidoscopy or colonoscopy. You may have a sigmoidoscopy every 5 years or a colonoscopy every 10 years starting at age 10.  Hepatitis C blood test.  Hepatitis B blood test.  Sexually transmitted disease (STD) testing.  Diabetes screening. This is done by checking your blood sugar (glucose) after you have not eaten for a while (fasting). You may have this done every 1-3 years.  Bone density scan. This is done to screen for osteoporosis. You may have this done starting at age 64.  Mammogram. This may be done every 1-2 years. Talk to your health care provider about how often you should have regular mammograms. Talk  with your health care provider about your test results, treatment options, and if necessary, the need for more tests. Vaccines  Your health care provider may recommend certain vaccines, such as:  Influenza vaccine. This is recommended every year.  Tetanus,  diphtheria, and acellular pertussis (Tdap, Td) vaccine. You may need a Td booster every 10 years.  Zoster vaccine. You may need this after age 37.  Pneumococcal 13-valent conjugate (PCV13) vaccine. One dose is recommended after age 10.  Pneumococcal polysaccharide (PPSV23) vaccine. One dose is recommended after age 3. Talk to your health care provider about which screenings and vaccines you need and how often you need them. This information is not intended to replace advice given to you by your health care provider. Make sure you discuss any questions you have with your health care provider. Document Released: 10/20/2015 Document Revised: 06/12/2016 Document Reviewed: 07/25/2015 Elsevier Interactive Patient Education  2017 [place] Prevention in the Home Falls can cause injuries. They can happen to people of all ages. There are many things you can do to make your home safe and to help prevent falls. What can I do on the outside of my home?  Regularly fix the edges of walkways and driveways and fix any cracks.  Remove anything that might make you trip as you walk through a door, such as a raised step or threshold.  Trim any bushes or trees on the path to your home.  Use bright outdoor lighting.  Clear any walking paths of anything that might make someone trip, such as rocks or tools.  Regularly check to see if handrails are loose or broken. Make sure that both sides of any steps have handrails.  Any raised decks and porches should have guardrails on the edges.  Have any leaves, snow, or ice cleared regularly.  Use sand or salt on walking paths during winter.  Clean up any spills in your garage right away. This includes oil or grease spills. What can I do in the bathroom?  Use night lights.  Install grab bars by the toilet and in the tub and shower. Do not use towel bars as grab bars.  Use non-skid mats or decals in the tub or shower.  If you need to sit down in  the shower, use a plastic, non-slip stool.  Keep the floor dry. Clean up any water that spills on the floor as soon as it happens.  Remove soap buildup in the tub or shower regularly.  Attach bath mats securely with double-sided non-slip rug tape.  Do not have throw rugs and other things on the floor that can make you trip. What can I do in the bedroom?  Use night lights.  Make sure that you have a light by your bed that is easy to reach.  Do not use any sheets or blankets that are too big for your bed. They should not hang down onto the floor.  Have a firm chair that has side arms. You can use this for support while you get dressed.  Do not have throw rugs and other things on the floor that can make you trip. What can I do in the kitchen?  Clean up any spills right away.  Avoid walking on wet floors.  Keep items that you use a lot in easy-to-reach places.  If you need to reach something above you, use a strong step stool that has a grab bar.  Keep electrical cords out of the way.  Do  not use floor polish or wax that makes floors slippery. If you must use wax, use non-skid floor wax.  Do not have throw rugs and other things on the floor that can make you trip. What can I do with my stairs?  Do not leave any items on the stairs.  Make sure that there are handrails on both sides of the stairs and use them. Fix handrails that are broken or loose. Make sure that handrails are as long as the stairways.  Check any carpeting to make sure that it is firmly attached to the stairs. Fix any carpet that is loose or worn.  Avoid having throw rugs at the top or bottom of the stairs. If you do have throw rugs, attach them to the floor with carpet tape.  Make sure that you have a light switch at the top of the stairs and the bottom of the stairs. If you do not have them, ask someone to add them for you. What else can I do to help prevent falls?  Wear shoes that:  Do not have high  heels.  Have rubber bottoms.  Are comfortable and fit you well.  Are closed at the toe. Do not wear sandals.  If you use a stepladder:  Make sure that it is fully opened. Do not climb a closed stepladder.  Make sure that both sides of the stepladder are locked into place.  Ask someone to hold it for you, if possible.  Clearly mark and make sure that you can see:  Any grab bars or handrails.  First and last steps.  Where the edge of each step is.  Use tools that help you move around (mobility aids) if they are needed. These include:  Canes.  Walkers.  Scooters.  Crutches.  Turn on the lights when you go into a dark area. Replace any light bulbs as soon as they burn out.  Set up your furniture so you have a clear path. Avoid moving your furniture around.  If any of your floors are uneven, fix them.  If there are any pets around you, be aware of where they are.  Review your medicines with your doctor. Some medicines can make you feel dizzy. This can increase your chance of falling. Ask your doctor what other things that you can do to help prevent falls. This information is not intended to replace advice given to you by your health care provider. Make sure you discuss any questions you have with your health care provider. Document Released: 07/20/2009 Document Revised: 02/29/2016 Document Reviewed: 10/28/2014 Elsevier Interactive Patient Education  2017 [employer].

## 2021-01-24 NOTE — Progress Notes (Signed)
Virtual Visit via Telephone Note  I connected with  Judy [name] on 01/24/21 at  1:00 PM EDT by telephone and verified that I am speaking with the correct person using two identifiers.  Medicare Annual Wellness visit completed telephonically due to Covid-19 pandemic.   Persons participating in this call: This Health Coach and this patient.   Location: Patient: Home Provider: Office   I discussed the limitations, risks, security and privacy concerns of performing an evaluation and management service by telephone and the availability of in person appointments. The patient expressed understanding and agreed to proceed.  Unable to perform video visit due to video visit attempted and failed and/or patient does not have video capability.   Some vital signs may be absent or patient reported.   Willette Brace, LPN    Subjective:   Judy [name] is a 70 y.o. female who presents for an Initial Medicare Annual Wellness Visit.  Review of Systems     Cardiac Risk Factors include: advanced age (>53mn, >>53women);obesity (BMI >30kg/m2)     Objective:    Today's Vitals   01/24/21 1303  PainSc: 2    There is no height or weight on file to calculate BMI.  Advanced Directives 01/24/2021 08/07/2020 06/04/2019 06/01/2019 03/19/2019 03/19/2019 03/10/2019  Does Patient Have a Medical Advance Directive? No No No No No No No  Would patient like information on creating a medical advance directive? Yes (MAU/Ambulatory/Procedural Areas - Information given) - No - Patient declined Yes (MAU/Ambulatory/Procedural Areas - Information given) No - Patient declined No - Patient declined -    Current Medications (verified) Outpatient Encounter Medications as of 01/24/2021  Medication Sig  . Acetaminophen (TYLENOL 8 HOUR PO) Take 500 mg by mouth.  .Marland Kitchenalbuterol (VENTOLIN HFA) 108 (90 Base) MCG/ACT inhaler Inhale 1-2 puffs into the lungs every 6 (six) hours as needed for wheezing or shortness of  breath.  .Marland Kitchenatorvastatin (LIPITOR) 40 MG tablet Take 1 tablet (40 mg total) by mouth daily.  . cariprazine (VRAYLAR) capsule Take 6 mg by mouth daily.   . Certolizumab Pegol (CIMZIA PREFILLED) 2 X 200 MG/ML KIT Inject 400 mg into the skin every 30 (thirty) days.   . Cyanocobalamin (VITAMIN B-12 PO) Take by mouth daily.  .Ruthe Mannan200-5 MCG/ACT AERO SMARTSIG:2 Puff(s) By Mouth Twice Daily  . lurasidone (LATUDA) 80 MG TABS tablet Take 80 mg by mouth every evening.   .Marland KitchenQUEtiapine (SEROQUEL) 400 MG tablet Take 800 mg by mouth at bedtime.  . Vitamin D, Ergocalciferol, (DRISDOL) 1.25 MG (50000 UNIT) CAPS capsule Take 1 capsule (50,000 Units total) by mouth every 7 (seven) days.  .Marland KitchenLORazepam (ATIVAN) 1 MG tablet Take 2 mg by mouth daily.  . [DISCONTINUED] IBUPROFEN PO Take by mouth as needed. (Patient not taking: Reported on 01/24/2021)   No facility-administered encounter medications on file as of 01/24/2021.    Allergies (verified) Celexa [citalopram hydrobromide] and Gabapentin   History: Past Medical History:  Diagnosis Date  . Anxiety   . Arthritis   . Avascular necrosis (HCC)    Left hip  . Bipolar 1 disorder ([facility]   . COPD (chronic obstructive pulmonary disease) ([facility]   . Pneumonia   . PTSD (post-traumatic stress disorder)    Past Surgical History:  Procedure Laterality Date  . bone spurs foot Right   . COLONOSCOPY    . HAND SURGERY Left   . JOINT REPLACEMENT    . TONSILLECTOMY    . TONSILLECTOMY  AND ADENOIDECTOMY    . 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 TOTAL HIP ARTHROPLASTY Right 06/04/2019   Procedure: RIGHT TOTAL HIP ARTHROPLASTY ANTERIOR APPROACH;  Surgeon: Mcarthur Rossetti, MD;  Location: [facility];  Service: Orthopedics;  Laterality: Right;   Family History  Problem Relation Age of Onset  . CAD Mother  66  . COPD Mother   . Depression Mother   . Heart attack Father   . Colon polyps Sister   . Heart attack Maternal Grandmother   . Early death Maternal Grandfather   . Hearing loss Paternal Grandfather   . Bipolar disorder Son   . Asthma Daughter    Social History   Socioeconomic History  . Marital status: Divorced    Spouse name: Not on file  . Number of children: Not on file  . Years of education: Not on file  . Highest education level: Not on file  Occupational History  . Occupation: retired  Tobacco Use  . Smoking status: Former Smoker    Years: 43.00    Types: Cigarettes    Quit date: 05/19/2020    Years since quitting: 0.6  . Smokeless tobacco: Never Used  Vaping Use  . Vaping Use: Never used  Substance and Sexual Activity  . Alcohol use: Yes    Comment: occ  . Drug use: No  . Sexual activity: Not on file  Other Topics Concern  . Not on file  Social History Narrative  . Not on file   Social Determinants of Health   Financial Resource Strain: Low Risk   . Difficulty of Paying Living Expenses: Not hard at all  Food Insecurity: No Food Insecurity  . Worried About Charity fundraiser in the Last Year: Never true  . Ran Out of Food in the Last Year: Never true  Transportation Needs: No Transportation Needs  . Lack of Transportation (Medical): No  . Lack of Transportation (Non-Medical): No  Physical Activity: Insufficiently Active  . Days of Exercise per Week: 3 days  . Minutes of Exercise per Session: 30 min  Stress: No Stress Concern Present  . Feeling of Stress : Only a little  Social Connections: Socially Isolated  . Frequency of Communication with Friends and Family: Once a week  . Frequency of Social Gatherings with Friends and Family: Once a week  . Attends Religious Services: More than 4 times per year  . Active Member of Clubs or Organizations: No  . Attends Archivist Meetings: Never  . Marital Status: Divorced    Tobacco  Counseling Counseling given: Not Answered   Clinical Intake:  Pre-visit preparation completed: Yes  Pain : 0-10 Pain Score: 2  Pain Type: Chronic pain Pain Location: Back Pain Descriptors / Indicators: Sore     BMI - recorded: 30.15 Nutritional Status: BMI > 30  Obese Nutritional Risks: None Diabetes: No  How often do you need to have someone help you when you read instructions, pamphlets, or other written materials from your doctor or pharmacy?: 1 - Never  Diabetic?No  Interpreter Needed?: No  Information entered by :: Charlott Rakes, LPN   Activities of Daily Living In your present state of health, do you have any difficulty performing the following activities: 01/24/2021 01/24/2021  Hearing? N N  Vision? N N  Difficulty concentrating or making decisions? Tempie Donning  Walking or climbing stairs? Y N  Dressing or bathing? Y N  Comment difficult to tie shoes and wash hair -  Doing errands, shopping? N N  Preparing Food and eating ? N N  Using the Toilet? N N  In the past six months, have you accidently leaked urine? N N  Do you have problems with loss of bowel control? N N  Managing your Medications? N N  Managing your Finances? N N  Housekeeping or managing your Housekeeping? N N  Some recent data might be hidden    Patient Care Team: Caren Macadam, MD as PCP - General (Family Medicine)  Indicate any recent Medical Services you may have received from other than [facility] providers in the past year (date may be approximate).     Assessment:   This is a routine wellness examination for Sevyn.  Hearing/Vision screen  Hearing Screening   '125Hz'  '250Hz'  '500Hz'  '1000Hz'  '2000Hz'  '3000Hz'  '4000Hz'  '6000Hz'  '8000Hz'   Right ear:           Left ear:           Comments: Pt denies any hearing issues   Vision Screening Comments: Pt has an appt with Dr Katy Fitch on May 17th, 2022 for annual eye exams   Dietary issues and exercise activities discussed: Current Exercise Habits: Home  exercise routine, Type of exercise: Other - see comments (gym), Time (Minutes): 30, Frequency (Times/Week): 3, Weekly Exercise (Minutes/Week): 90  Goals    . Patient Stated     Get back to being able bounce back from hip surgery       Depression Screen PHQ 2/9 Scores 01/24/2021 10/30/2020  PHQ - 2 Score 1 1    Fall Risk Fall Risk  01/24/2021 10/30/2020  Falls in the past year? 0 0  Number falls in past yr: 0 0  Injury with Fall? 0 0  Risk for fall due to : Impaired vision -  Follow up Falls prevention discussed -    FALL RISK PREVENTION PERTAINING TO THE HOME:  Any stairs in or around the home? Yes  If so, are there any without handrails? No  Home free of loose throw rugs in walkways, pet beds, electrical cords, etc? Yes  Adequate lighting in your home to reduce risk of falls? Yes   ASSISTIVE DEVICES UTILIZED TO PREVENT FALLS:  Life alert? No  Use of a cane, walker or w/c? Yes  Grab bars in the bathroom? No  Shower chair or bench in shower? Yes  Elevated toilet seat or a handicapped toilet? Yes   TIMED UP AND GO:  Was the test performed? No .     Cognitive Function:     6CIT Screen 01/24/2021  What Year? 0 points  What month? 0 points  Count back from 20 0 points  Months in reverse 0 points  Repeat phrase 0 points    Immunizations Immunization History  Administered Date(s) Administered  . Influenza, High Dose Seasonal PF 08/25/2018  . Moderna Sars-Covid-2 Vaccination 11/08/2019, 12/05/2019, 10/09/2020  . Pneumococcal Conjugate-13 09/23/2018  . Pneumococcal Polysaccharide-23 08/27/2016  . Tdap 10/07/2012  . Yellow Fever 07/05/2008    TDAP status: Up to date  Flu Vaccine status: Declined, Education has been provided regarding the importance of this vaccine but patient still declined. Advised may receive this vaccine at local pharmacy or Health Dept. Aware to provide a copy of the vaccination record if obtained from local pharmacy or Health Dept. Verbalized  acceptance and understanding.  Pneumococcal vaccine status: Up to date  Covid-19 vaccine status: Completed vaccines  Qualifies for Shingles Vaccine? Yes   Zostavax completed No   Shingrix Completed?: No.    Education has been provided regarding the importance of this vaccine. Patient has been advised to call insurance company to determine out of pocket expense if they have not yet received this vaccine. Advised may also receive vaccine at local pharmacy or Health Dept. Verbalized acceptance and understanding.  Screening Tests Health Maintenance  Topic Date Due  . INFLUENZA VACCINE  05/07/2021  . PNA vac Low Risk Adult (2 of 2 - PPSV23) 08/27/2021  . MAMMOGRAM  08/21/2022  . TETANUS/TDAP  10/07/2022  . COLONOSCOPY (Pts 45-35yr Insurance coverage will need to be confirmed)  10/07/2028  . DEXA SCAN  Completed  . COVID-19 Vaccine  Completed  . Hepatitis C Screening  Completed  . HPV VACCINES  Aged Out    Health Maintenance  There are no preventive care reminders to display for this patient.  Colorectal cancer screening: Type of screening: Colonoscopy. Completed 10/07/18. Repeat every 10 years  Mammogram status: Completed 08/21/20. Repeat every year  Bone Density status: Completed 10/05/18. Results reflect: Bone density results: OSTEOPENIA. Repeat every 2-3 years.    Additional Screening:  Hepatitis C Screening:  Completed 12/11/18  Vision Screening: Recommended annual ophthalmology exams for early detection of glaucoma and other disorders of the eye. Is the patient up to date with their annual eye exam?  Yes  Who is the provider or what is the name of the office in which the patient attends annual eye exams? Dr GKaty Fitch If pt is not established with a provider, would they like to be referred to a provider to establish care? No .   Dental Screening: Recommended annual dental exams for proper oral hygiene  Community Resource Referral / Chronic Care Management: CRR required this  visit?  No   CCM required this visit?  No      Plan:     I have personally reviewed and noted the following in the patient's chart:   . Medical and social history . Use of alcohol, tobacco or illicit drugs  . Current medications and supplements . Functional ability and status . Nutritional status . Physical activity . Advanced directives . List of other physicians . Hospitalizations, surgeries, and ER visits in previous 12 months . Vitals . Screenings to include cognitive, depression, and falls . Referrals and appointments  In addition, I have reviewed and discussed with patient certain preventive protocols, quality metrics, and best practice recommendations. A written personalized care plan for preventive services as well as general preventive health recommendations were provided to patient.     TWillette Brace LPN   [id number]  Nurse Notes: None

## 2021-02-12 ENCOUNTER — Ambulatory Visit (INDEPENDENT_AMBULATORY_CARE_PROVIDER_SITE_OTHER): Payer: Medicare Other

## 2021-02-12 ENCOUNTER — Other Ambulatory Visit: Payer: Self-pay

## 2021-02-12 DIAGNOSIS — Z79899 Other long term (current) drug therapy: Secondary | ICD-10-CM | POA: Diagnosis not present

## 2021-02-12 DIAGNOSIS — M45 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: Secondary | ICD-10-CM

## 2021-02-12 MED ORDER — CERTOLIZUMAB PEGOL 2 X 200 MG ~~LOC~~ KIT
400.0000 mg | PACK | Freq: Once | SUBCUTANEOUS | Status: AC
  Administered 2021-02-12: 400 mg via SUBCUTANEOUS

## 2021-02-12 NOTE — Progress Notes (Signed)
Pharmacy Note  Subjective:   Patient presents to clinic today to receive monthly dose of Cimzia.  Patient running a fever or have signs/symptoms of infection? No  Patient currently on antibiotics for the treatment of infection? No  Patient have any upcoming invasive procedures/surgeries? No  Objective: CMP     Component Value Date/Time   NA 142 01/15/2021 1512   K 4.2 01/15/2021 1512   CL 110 01/15/2021 1512   CO2 24 01/15/2021 1512   GLUCOSE 125 (H) 01/15/2021 1512   BUN 13 01/15/2021 1512   CREATININE 1.03 (H) 01/15/2021 1512   CALCIUM 9.3 01/15/2021 1512   PROT 6.4 01/15/2021 1512   ALBUMIN 4.2 10/30/2020 1227   AST 12 01/15/2021 1512   ALT 14 01/15/2021 1512   ALKPHOS 144 (H) 10/30/2020 1227   BILITOT 0.5 01/15/2021 1512   GFRNONAA 56 (L) 01/15/2021 1512   GFRAA 65 01/15/2021 1512    CBC    Component Value Date/Time   WBC 5.9 01/15/2021 1512   RBC 4.32 01/15/2021 1512   HGB 12.4 01/15/2021 1512   HCT 38.1 01/15/2021 1512   PLT 238 01/15/2021 1512   MCV 88.2 01/15/2021 1512   MCH 28.7 01/15/2021 1512   MCHC 32.5 01/15/2021 1512   RDW 12.9 01/15/2021 1512   LYMPHSABS 1,404 01/15/2021 1512   MONOABS 0.4 10/30/2020 1227   EOSABS 89 01/15/2021 1512   BASOSABS 18 01/15/2021 1512    Baseline Immunosuppressant Therapy Labs TB GOLD Quantiferon TB Gold Latest Ref Rng & Units 04/26/2020  Quantiferon TB Gold Plus NEGATIVE NEGATIVE   Hepatitis Panel Hepatitis Latest Ref Rng & Units 04/26/2020  Hep B Surface Ag NON-REACTI NON-REACTIVE  Hep B IgM NON-REACTI NON-REACTIVE  Hep C Ab NON-REACTI -  Hep C Ab NON-REACTI -   HIV Lab Results  Component Value Date   HIV NON-REACTIVE 04/26/2020   HIV Non Reactive 08/23/2017   HIV Non Reactive 04/14/2017   Immunoglobulins Immunoglobulin Electrophoresis Latest Ref Rng & Units 04/26/2020  IgA  70 - 320 mg/dL 120  IgG 600 - 1,540 mg/dL 824  IgM 50 - 300 mg/dL 47(L)   SPEP Serum Protein Electrophoresis Latest Ref Rng &  Units 01/15/2021  Total Protein 6.1 - 8.1 g/dL 6.4  Albumin 3.8 - 4.8 g/dL -  Alpha-1 0.2 - 0.3 g/dL -  Alpha-2 0.5 - 0.9 g/dL -  Beta Globulin 0.4 - 0.6 g/dL -  Beta 2 0.2 - 0.5 g/dL -  Gamma Globulin 0.8 - 1.7 g/dL -   G6PD No results found for: G6PDH TPMT No results found for: TPMT   Chest x-ray: 08/22/2017 No active cardiopulmonary disease.  Assessment/Plan:  Administrations This Visit    Certolizumab Pegol KIT 400 mg    Admin Date 02/12/2021 Action Given Dose 400 mg Route Subcutaneous Administered By Earnestine Mealing, CMA         Patient tolerated injection well.   Patient is scheduled for next injection on 03/12/2021. Labs were updated on 01/15/2021. Patient updated CMP w/GFR in the office today. Patient is to call the office and reschedule appointment if running a fever with any signs/symptoms of infection, on antibiotics for an active infection or has an upcoming invasive procedure.  All questions encouraged and answered. Instructed patient to call with any further questions or concerns.

## 2021-02-13 LAB — COMPLETE METABOLIC PANEL WITH GFR
AG Ratio: 1.7 (calc) (ref 1.0–2.5)
ALT: 18 U/L (ref 6–29)
AST: 16 U/L (ref 10–35)
Albumin: 4.1 g/dL (ref 3.6–5.1)
Alkaline phosphatase (APISO): 158 U/L — ABNORMAL HIGH (ref 37–153)
BUN/Creatinine Ratio: 10 (calc) (ref 6–22)
BUN: 11 mg/dL (ref 7–25)
CO2: 25 mmol/L (ref 20–32)
Calcium: 9.3 mg/dL (ref 8.6–10.4)
Chloride: 107 mmol/L (ref 98–110)
Creat: 1.09 mg/dL — ABNORMAL HIGH (ref 0.50–0.99)
GFR, Est African American: 60 mL/min/{1.73_m2} (ref >=60)
GFR, Est Non African American: 52 mL/min/{1.73_m2} — ABNORMAL LOW (ref >=60)
Globulin: 2.4 g/dL (calc) (ref 1.9–3.7)
Glucose, Bld: 91 mg/dL (ref 65–99)
Potassium: 4.2 mmol/L (ref 3.5–5.3)
Sodium: 140 mmol/L (ref 135–146)
Total Bilirubin: 0.4 mg/dL (ref 0.2–1.2)
Total Protein: 6.5 g/dL (ref 6.1–8.1)

## 2021-02-13 NOTE — Progress Notes (Signed)
Creatinine remains slightly elevated and is trending up.  GFR is low-52. Alk phos is borderline elevated-158.    Patient declined use of NSAIDs in the past.  She should continue to avoid all NSAIDs.  Please forward lab work to PCP.

## 2021-02-22 ENCOUNTER — Other Ambulatory Visit: Payer: Self-pay | Admitting: Family Medicine

## 2021-02-22 ENCOUNTER — Telehealth: Payer: Self-pay | Admitting: Family Medicine

## 2021-02-22 DIAGNOSIS — R748 Abnormal levels of other serum enzymes: Secondary | ICD-10-CM

## 2021-02-22 NOTE — Telephone Encounter (Signed)
She should have completed the higher dose vitamin D at this point. If she is supplementing vitamin D now; I would suggest 1000 units daily (no more because this can affect liver).   *would like for her to have repeat CMP fasting, but after drinking plenty of water in about a month to recheck labs.   *please set up lab visit; I will order labs.

## 2021-02-22 NOTE — Telephone Encounter (Signed)
-----   Message from Audrie Lia, RT sent at 02/13/2021  3:18 PM EDT ----- Regarding: LAB RESULTS Please review mutual patient's lab results. Thank you.

## 2021-02-22 NOTE — Telephone Encounter (Signed)
She should have completed the higher dose vitamin D at this point. If she is supplementing vitamin D now; I would suggest 1000 units daily (no more because this can affect liver).   *would like for her to have repeat CMP fasting, but after drinking plenty of water in about a month to recheck labs.   *please set up lab visit; I will order labs.  

## 2021-02-23 NOTE — Telephone Encounter (Signed)
Patient informed of the message below.

## 2021-02-23 NOTE — Telephone Encounter (Signed)
Spoke with the patient and informed her of the message below.  Lab appt scheduled for 6/22.  Patient stated she is currently taking 2000 units of vitamin D once a day, purchased a value pack and questioned if she could take this every other day?  Patient states someone recommended CO Q10 and she questioned if this would interfere with her other medications?  Message sent to PCP.

## 2021-02-23 NOTE — Telephone Encounter (Signed)
*  would hold off on taking any additional supplements including CoQ10 as this is filtered through liver. *OK to take the vitamin D that she has now every other day

## 2021-03-12 ENCOUNTER — Other Ambulatory Visit: Payer: Self-pay

## 2021-03-12 ENCOUNTER — Ambulatory Visit (INDEPENDENT_AMBULATORY_CARE_PROVIDER_SITE_OTHER): Payer: Medicare Other | Admitting: *Deleted

## 2021-03-12 VITALS — BP 123/78 | HR 84

## 2021-03-12 DIAGNOSIS — M45 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: Secondary | ICD-10-CM

## 2021-03-12 MED ORDER — CERTOLIZUMAB PEGOL 2 X 200 MG ~~LOC~~ KIT
400.0000 mg | PACK | Freq: Once | SUBCUTANEOUS | Status: AC
  Administered 2021-03-12: 400 mg via SUBCUTANEOUS

## 2021-03-12 NOTE — Progress Notes (Signed)
Pharmacy Note  Subjective:   Patient presents to clinic today to receive monthly dose of Cimzia.  Patient running a fever or have signs/symptoms of infection? No  Patient currently on antibiotics for the treatment of infection? No  Patient have any upcoming invasive procedures/surgeries? No  Objective: CMP     Component Value Date/Time   NA 140 02/12/2021 1536   K 4.2 02/12/2021 1536   CL 107 02/12/2021 1536   CO2 25 02/12/2021 1536   GLUCOSE 91 02/12/2021 1536   BUN 11 02/12/2021 1536   CREATININE 1.09 (H) 02/12/2021 1536   CALCIUM 9.3 02/12/2021 1536   PROT 6.5 02/12/2021 1536   ALBUMIN 4.2 10/30/2020 1227   AST 16 02/12/2021 1536   ALT 18 02/12/2021 1536   ALKPHOS 144 (H) 10/30/2020 1227   BILITOT 0.4 02/12/2021 1536   GFRNONAA 52 (L) 02/12/2021 1536   GFRAA 60 02/12/2021 1536    CBC    Component Value Date/Time   WBC 5.9 01/15/2021 1512   RBC 4.32 01/15/2021 1512   HGB 12.4 01/15/2021 1512   HCT 38.1 01/15/2021 1512   PLT 238 01/15/2021 1512   MCV 88.2 01/15/2021 1512   MCH 28.7 01/15/2021 1512   MCHC 32.5 01/15/2021 1512   RDW 12.9 01/15/2021 1512   LYMPHSABS 1,404 01/15/2021 1512   MONOABS 0.4 10/30/2020 1227   EOSABS 89 01/15/2021 1512   BASOSABS 18 01/15/2021 1512    Baseline Immunosuppressant Therapy Labs TB GOLD Quantiferon TB Gold Latest Ref Rng & Units 04/26/2020  Quantiferon TB Gold Plus NEGATIVE NEGATIVE   Hepatitis Panel Hepatitis Latest Ref Rng & Units 04/26/2020  Hep B Surface Ag NON-REACTI NON-REACTIVE  Hep B IgM NON-REACTI NON-REACTIVE  Hep C Ab NON-REACTI -  Hep C Ab NON-REACTI -   HIV Lab Results  Component Value Date   HIV NON-REACTIVE 04/26/2020   HIV Non Reactive 08/23/2017   HIV Non Reactive 04/14/2017   Immunoglobulins Immunoglobulin Electrophoresis Latest Ref Rng & Units 04/26/2020  IgA  70 - 320 mg/dL 120  IgG 600 - 1,540 mg/dL 824  IgM 50 - 300 mg/dL 47(L)   SPEP Serum Protein Electrophoresis Latest Ref Rng & Units  02/12/2021  Total Protein 6.1 - 8.1 g/dL 6.5  Albumin 3.8 - 4.8 g/dL -  Alpha-1 0.2 - 0.3 g/dL -  Alpha-2 0.5 - 0.9 g/dL -  Beta Globulin 0.4 - 0.6 g/dL -  Beta 2 0.2 - 0.5 g/dL -  Gamma Globulin 0.8 - 1.7 g/dL -   G6PD No results found for: G6PDH TPMT No results found for: TPMT   Chest x-ray: 08/22/2017 No active cardiopulmonary disease  Assessment/Plan:   Administrations This Visit    Certolizumab Pegol KIT 400 mg    Admin Date 03/12/2021 Action Given Dose 400 mg Route Subcutaneous Administered By Carole Binning, LPN          Patient tolerated injection well.   Appointment for next injection scheduled for 04/11/2021.  Patient due for labs in July 2022.  Patient is to call and reschedule appointment if running a fever with signs/symptoms of infection, on antibiotics for active infection or has an upcoming invasive procedure.  All questions encouraged and answered.  Instructed patient to call with any further questions or concerns.

## 2021-03-16 ENCOUNTER — Telehealth: Payer: Self-pay | Admitting: Pulmonary Disease

## 2021-03-16 NOTE — Telephone Encounter (Signed)
I called and spoke with patient regarding message. Patient stated that PA at Urgent Care gave her [facility] and it is not working for her. She is still getting SOB often, esp with exertion and is wanting a new inhaler like dulera that can help. Will route to Dr. Isaiah Serge  Dr. Isaiah Serge, please advise. Thanks!

## 2021-03-21 NOTE — Telephone Encounter (Signed)
Try anoro or stiolto. Whichever is covered by her insurance

## 2021-03-21 NOTE — Telephone Encounter (Signed)
Lm for patient.  

## 2021-03-26 MED ORDER — ANORO ELLIPTA 62.5-25 MCG/INH IN AEPB: 1.0000 | INHALATION_SPRAY | Freq: Every day | RESPIRATORY_TRACT | 4 refills | Status: DC

## 2021-03-26 NOTE — Telephone Encounter (Signed)
Pt is returning phone call. Pls regard; [phone]

## 2021-03-26 NOTE — Telephone Encounter (Signed)
Called patient. She confirmed that she has [facility] for her insurance. I looked at her formulary online. Anoro is a preferred medication, tier 3. Stiolto is not covered.   Anoro will be about $42 per [facility] at [facility].   Spoke with patient again. She is ok with switching to Anoro. RX has been sent.   Nothing further needed at time of call.

## 2021-03-27 ENCOUNTER — Ambulatory Visit (INDEPENDENT_AMBULATORY_CARE_PROVIDER_SITE_OTHER): Payer: Medicare Other | Admitting: Adult Health

## 2021-03-27 ENCOUNTER — Ambulatory Visit (INDEPENDENT_AMBULATORY_CARE_PROVIDER_SITE_OTHER): Payer: Medicare Other

## 2021-03-27 ENCOUNTER — Other Ambulatory Visit: Payer: Self-pay

## 2021-03-27 ENCOUNTER — Encounter: Payer: Self-pay | Admitting: Adult Health

## 2021-03-27 VITALS — BP 120/76 | HR 77 | Ht 70.0 in | Wt 226.8 lb

## 2021-03-27 DIAGNOSIS — J449 Chronic obstructive pulmonary disease, unspecified: Secondary | ICD-10-CM

## 2021-03-27 DIAGNOSIS — J441 Chronic obstructive pulmonary disease with (acute) exacerbation: Secondary | ICD-10-CM | POA: Diagnosis not present

## 2021-03-27 IMAGING — DX DG CHEST 2V
2 series · 2 of 2 positions shown · non-contrast
Comparison: [DATE]

CLINICAL DATA: COPD

EXAM:
CHEST - 2 VIEW

[chest pa]
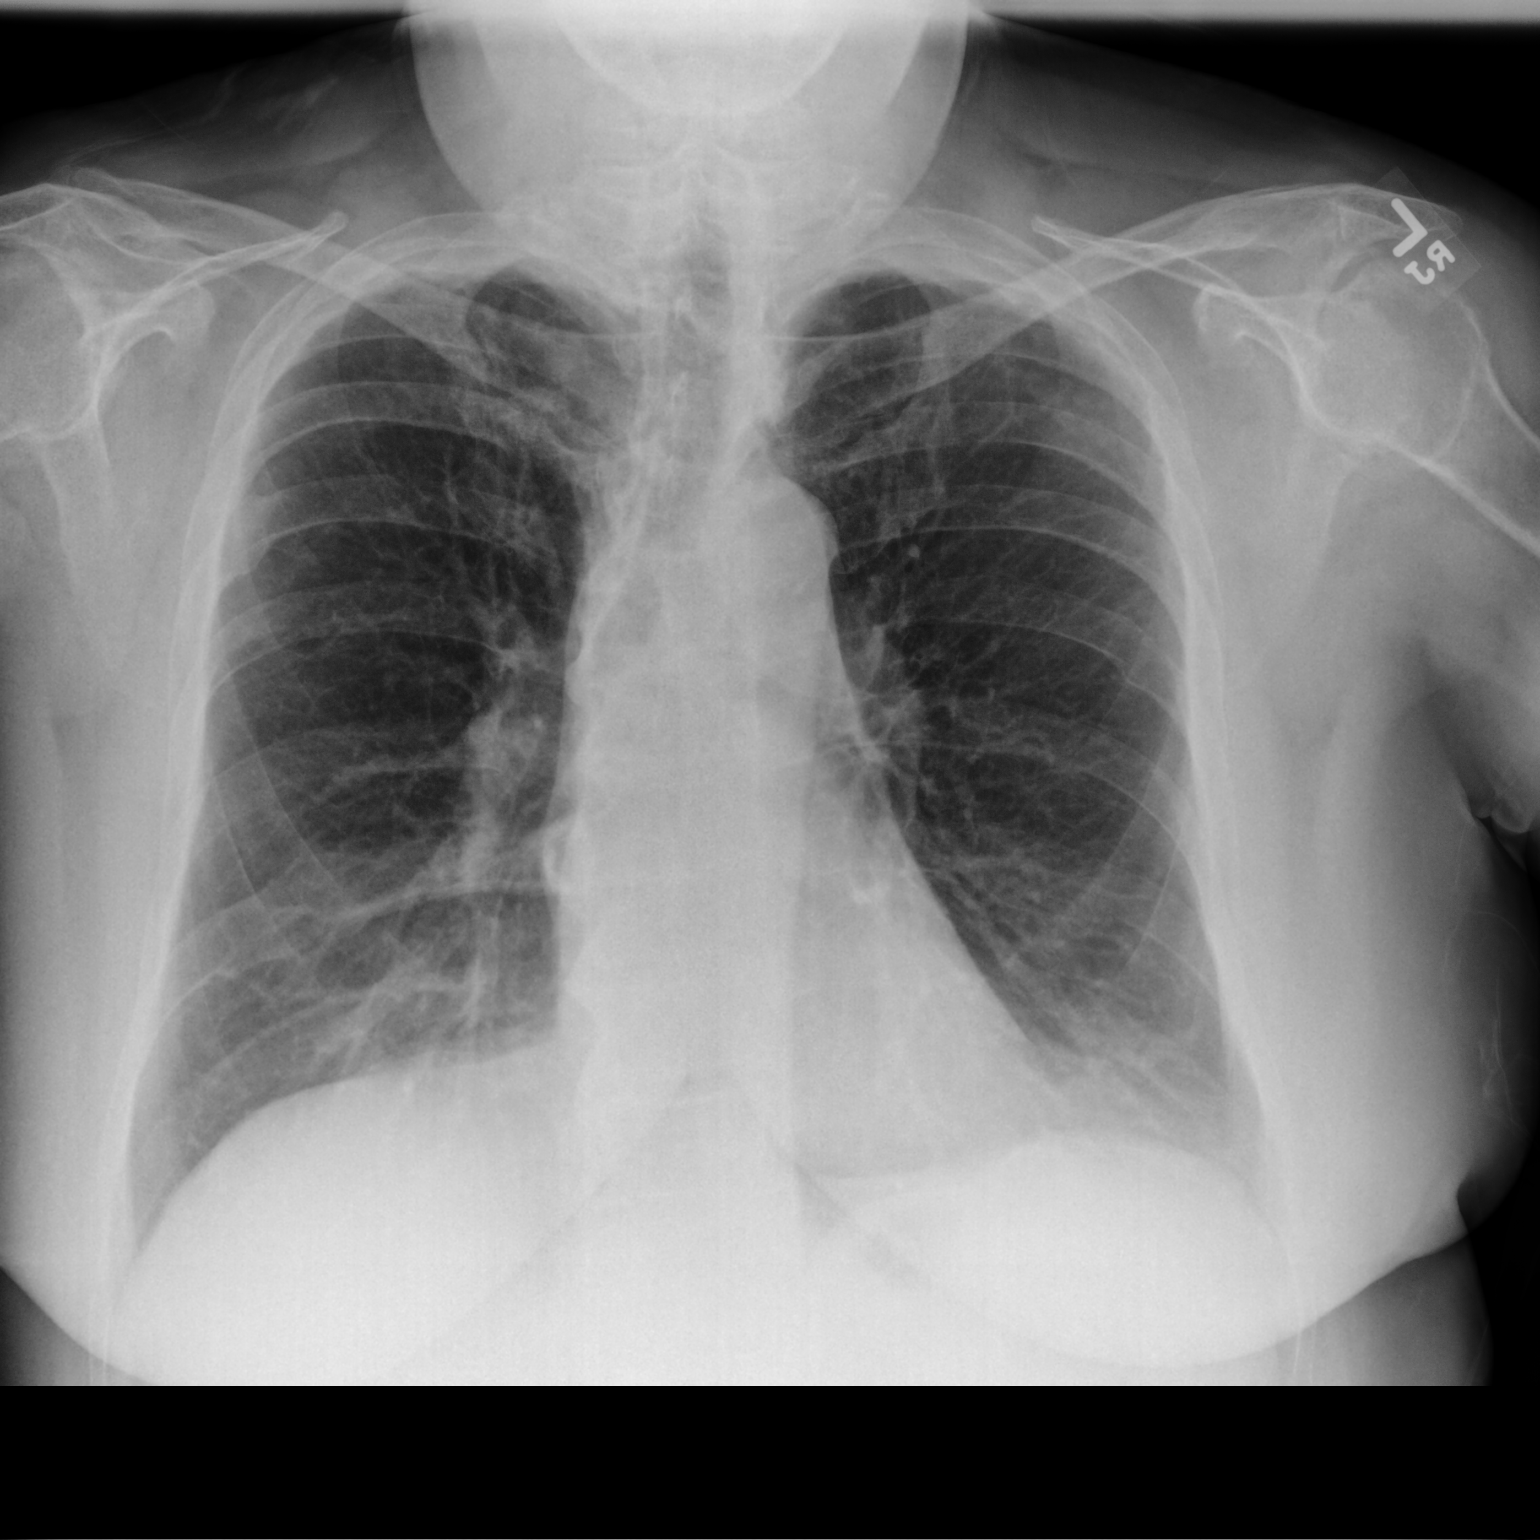

[chest lat]
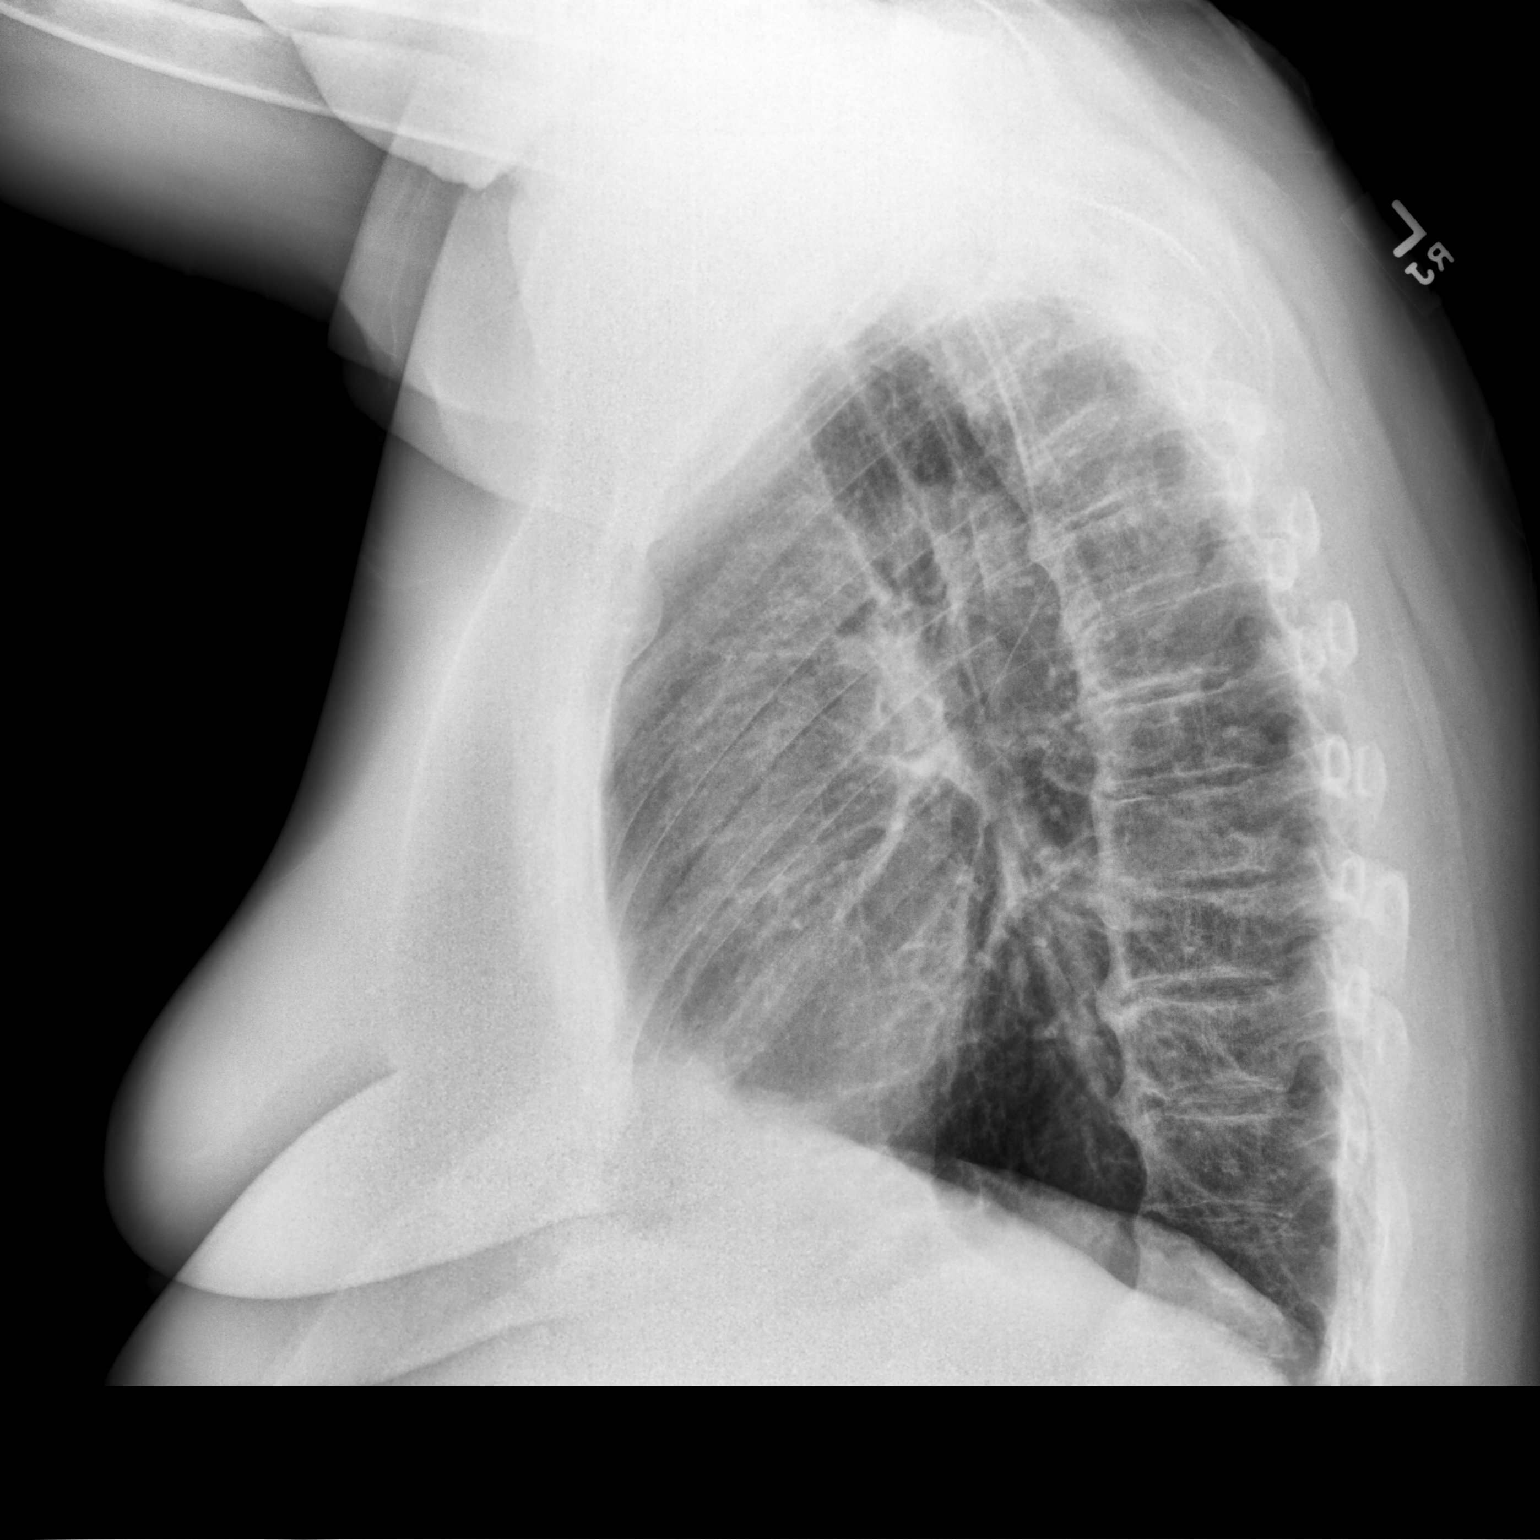

[2 of 2 positions shown; findings below may reference images not displayed]

FINDINGS: Left basilar scarring is again noted. There is a a new 10 mm opacity
noted at the left lung base on frontal examination which may
represent a nipple shadow, but is indeterminate. Lungs are otherwise
clear. No pneumothorax or pleural effusion. Remote right rib
fracture noted. Mild anterior wedge compression fracture has
developed at the thoracolumbar junction since prior examination.
Osseous structures are age-appropriate.
IMPRESSION: No radiographic evidence of acute cardiopulmonary disease.

Interval development of a 10 mm opacity possibly representing a
nipple shadow. Repeat imaging with nipple markers could confirm
this.

## 2021-03-27 MED ORDER — PREDNISONE 20 MG PO TABS: 20.0000 mg | ORAL_TABLET | Freq: Every day | ORAL | 0 refills | Status: DC

## 2021-03-27 NOTE — Patient Instructions (Addendum)
Prednisone 20mg  daily for 5 days. Take with food .  Start ANORO 1 puff daily . Rinse after use.  Activity as tolerated.  Chest xray today .  Follow up with Dr. in 3 months and As needed   Please contact office for sooner follow up if symptoms do not improve or worsen or seek emergency care

## 2021-03-27 NOTE — Assessment & Plan Note (Signed)
Flare - check cxr  Restart maintenance , begin LABA/LAMA   Plan  Patient Instructions  Prednisone 20mg  daily for 5 days. Take with food .  Start ANORO 1 puff daily . Rinse after use.  Activity as tolerated.  Chest xray today .  Follow up with Dr. in 3 months and As needed   Please contact office for sooner follow up if symptoms do not improve or worsen or seek emergency care

## 2021-03-27 NOTE — Progress Notes (Signed)
'@Patient'  ID: Judy [name], female    DOB: 02/16/1951, 70 y.o.   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 Smoking history: 50-pack-year smoker.  Continues to smoke 1 pack/day Travel history: Originally  from [place].  No significant recent travel Relevant family history: Daughter has asthma.  No other significant family history of lung disease.  CTA 04/15/2017-no pulmonary embolism, patchy upper lobe groundglass opacities CTA 08/23/2018-no PE, patchy bilateral groundglass opacities, predominantly in the bases. High-resolution CT   PFTs: Spirometry 05/18/18 FEV1 1.76, F/F 65.  Moderate obstruction.   08/10/18 FVC 3.31 [94%), FEV1 2.25 [83%), F/F 68, TLC 83%, DLCO 61% Mild obstruction with moderate diffusion defect.   FENO 08/05/2018-8   Labs: CBC 08/25/2017-WBC 9.6, eos 0% CBC 08/05/2018- WBC 9.1, eos 0.6%, absolute eosinophil count 55   IgE 08/10/2018-79 Alpha-1 antitrypsin 08/10/2018-192, PI MM  03/27/2021 Follow up: COPD and Pulmonary nodules  Patient returns for a 68-monthfollow-up she has COPD.  She says overall breathing has not been doing as well lately with dry cough and wheezing . No fever or discolored mucus. No hemoptysis .  [facility] last [facility] and [facility] . Has not started this yet.  Patient has known pulmonary nodules that are followed serially on CT chest.  He does participate in the low-dose CT screening program.  Last CT was November 01, 2020.  Showed a lung RADS 2 benign appearance.   2 scattered small solid pulmonary nodules largest 4.6 mm in volume.  In the left lower lobe. Weight is up 40lbs since quitting smoking.    Allergies  Allergen Reactions   Celexa [Citalopram Hydrobromide]     "goes weird"   Gabapentin     "goes weird"     Immunization History  Administered Date(s) Administered   Influenza, High Dose Seasonal PF 08/25/2018   Moderna Sars-Covid-2 Vaccination 11/08/2019, 12/05/2019, 10/09/2020   Pneumococcal Conjugate-13 09/23/2018   Pneumococcal Polysaccharide-23 08/27/2016   Tdap 10/07/2012   Yellow Fever 07/05/2008    Past Medical History:  Diagnosis Date   Anxiety    Arthritis    [place]    Left hip   Bipolar 1 disorder (HCC)    COPD (chronic obstructive pulmonary disease) (HCC)    Pneumonia    PTSD (post-traumatic stress disorder)     Tobacco History: Social History   Tobacco Use  Smoking Status Former   Years: 43.00   Pack years: 0.00   Types: Cigarettes   Quit date: 05/19/2020   Years since quitting: 0.8  Smokeless Tobacco Never   Counseling given: Not Answered   Outpatient Medications Prior to Visit  Medication Sig Dispense Refill   Acetaminophen (TYLENOL 8 HOUR PO) Take 500 mg by mouth.     atorvastatin (LIPITOR) 40 MG tablet Take 1 tablet (40 mg total) by mouth daily. 90 tablet 1   cariprazine (VRAYLAR) capsule Take 6 mg by mouth daily.      Certolizumab Pegol (CIMZIA PREFILLED) 2 X 200 MG/ML KIT Inject  400 mg into the skin every 30 (thirty) days.      Cyanocobalamin (VITAMIN B-12 PO) Take by mouth daily.     LORazepam (ATIVAN) 1 MG tablet Take 2 mg by mouth daily.     lurasidone (LATUDA) 80 MG TABS tablet Take 80 mg by mouth every evening.      QUEtiapine (SEROQUEL) 400 MG tablet Take 800 mg by mouth at bedtime.     albuterol (VENTOLIN HFA) 108 (90 Base) MCG/ACT inhaler Inhale 1-2 puffs into the lungs every 6 (six) hours as needed for wheezing or shortness of breath. (Patient not taking: Reported on  03/27/2021) 8 g 2   umeclidinium-vilanterol (ANORO ELLIPTA) 62.5-25 MCG/INH AEPB Inhale 1 puff into the lungs daily. (Patient not taking: Reported on 03/27/2021) 60 each 4   No facility-administered medications prior to visit.     Review of Systems:   Constitutional:   No  weight loss, night sweats,  Fevers, chills,  +fatigue, or  lassitude.  HEENT:   No headaches,  Difficulty swallowing,  Tooth/dental problems, or  Sore throat,                No sneezing, itching, ear ache, nasal congestion, post nasal drip,   CV:  No chest pain,  Orthopnea, PND, swelling in lower extremities, anasarca, dizziness, palpitations, syncope.   GI  No heartburn, indigestion, abdominal pain, nausea, vomiting, diarrhea, change in bowel habits, loss of appetite, bloody stools.   Resp:  .  No chest wall deformity  Skin: no rash or lesions.  GU: no dysuria, change in color of urine, no urgency or frequency.  No flank pain, no hematuria   MS:  No joint pain or swelling.  No decreased range of motion.  Chronic neck and back pain.    Physical Exam  BP 120/76   Pulse 77   Ht '5\' 10"'  (1.778 m)   Wt 226 lb 12.8 oz (102.9 kg)   SpO2 97%   BMI 32.54 kg/m   GEN: A/Ox3; pleasant , NAD, well nourished    HEENT:  [place]/AT,  NOSE-clear, THROAT-clear, no lesions, no postnasal drip or exudate noted.   NECK:  Supple w/ fair ROM; no JVD; normal carotid impulses w/o bruits; no thyromegaly or nodules palpated; no lymphadenopathy.    RESP  Clear  P & A; w/o, wheezes/ rales/ or rhonchi. no accessory muscle use, no dullness to percussion  CARD:  RRR, no m/r/g, no peripheral edema, pulses intact, no cyanosis or clubbing.  GI:   Soft & nt; nml bowel sounds; no organomegaly or masses detected.   Musco: Warm bil, no deformities or joint swelling noted.   Neuro: alert, no focal deficits noted.    Skin: Warm, no lesions or rashes    Lab Results:  CBC   No results found for: PROBNP  Imaging: No results  found.   PFT Results Latest Ref Rng & Units 08/10/2018  FVC-Pre L 3.20  FVC-Predicted Pre % 91  FVC-Post L 3.31  FVC-Predicted Post % 94  Pre FEV1/FVC % % 66  Post FEV1/FCV % % 68  FEV1-Pre L 2.11  FEV1-Predicted Pre % 78  FEV1-Post L 2.25  DLCO uncorrected ml/min/mmHg 17.37  DLCO UNC% % 61  DLVA Predicted % 65    No results found for: NITRICOXIDE      Assessment & Plan:   COPD with acute exacerbation (HCC) Flare - check cxr  Restart maintenance , begin LABA/LAMA   Plan  Patient Instructions  Prednisone  33m daily for 5 days. Take with food .  Start ANORO 1 puff daily . Rinse after use.  Activity as tolerated.  Chest xray today .  Follow up with Dr. MVaughan Brownerin 3 months and As needed   Please contact office for sooner follow up if symptoms do not improve or worsen or seek emergency care         I spent   31 minutes dedicated to the care of this patient on the date of this encounter to include pre-visit review of records, face-to-face time with the patient discussing conditions above, post visit ordering of testing, clinical documentation with the electronic health record, making appropriate referrals as documented, and communicating necessary findings to members of the patients care team.    TRexene Edison NP 03/27/2021

## 2021-03-28 ENCOUNTER — Other Ambulatory Visit (INDEPENDENT_AMBULATORY_CARE_PROVIDER_SITE_OTHER): Payer: Medicare Other

## 2021-03-28 DIAGNOSIS — R748 Abnormal levels of other serum enzymes: Secondary | ICD-10-CM

## 2021-03-29 LAB — GAMMA GT: GGT: 22 U/L (ref 7–51)

## 2021-04-04 ENCOUNTER — Other Ambulatory Visit: Payer: Self-pay | Admitting: *Deleted

## 2021-04-04 DIAGNOSIS — J449 Chronic obstructive pulmonary disease, unspecified: Secondary | ICD-10-CM

## 2021-04-04 NOTE — Progress Notes (Signed)
Called and spoke with patient, advised of results/recommendations per Rubye Oaks NP.  Advised that I will put in for the CXR with nipple markers and she can come when it is a good time for her to have it done.  She verbalized understanding.  Nothing further needed.

## 2021-04-11 ENCOUNTER — Ambulatory Visit (INDEPENDENT_AMBULATORY_CARE_PROVIDER_SITE_OTHER): Payer: Medicare Other | Admitting: *Deleted

## 2021-04-11 ENCOUNTER — Other Ambulatory Visit: Payer: Self-pay

## 2021-04-11 VITALS — BP 119/86 | HR 85

## 2021-04-11 DIAGNOSIS — M45 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: Secondary | ICD-10-CM | POA: Diagnosis not present

## 2021-04-11 DIAGNOSIS — Z79899 Other long term (current) drug therapy: Secondary | ICD-10-CM

## 2021-04-11 DIAGNOSIS — Z111 Encounter for screening for respiratory tuberculosis: Secondary | ICD-10-CM

## 2021-04-11 DIAGNOSIS — Z9225 Personal history of immunosupression therapy: Secondary | ICD-10-CM

## 2021-04-11 MED ORDER — CERTOLIZUMAB PEGOL 2 X 200 MG ~~LOC~~ KIT
400.0000 mg | PACK | Freq: Once | SUBCUTANEOUS | Status: AC
  Administered 2021-04-11: 400 mg via SUBCUTANEOUS

## 2021-04-11 NOTE — Progress Notes (Signed)
Pharmacy Note  Subjective:   Patient presents to clinic today to receive monthly dose of Cimzia.  Patient running a fever or have signs/symptoms of infection? No  Patient currently on antibiotics for the treatment of infection? No  Patient have any upcoming invasive procedures/surgeries? No  Objective: CMP     Component Value Date/Time   NA 140 02/12/2021 1536   K 4.2 02/12/2021 1536   CL 107 02/12/2021 1536   CO2 25 02/12/2021 1536   GLUCOSE 91 02/12/2021 1536   BUN 11 02/12/2021 1536   CREATININE 1.09 (H) 02/12/2021 1536   CALCIUM 9.3 02/12/2021 1536   PROT 6.5 02/12/2021 1536   ALBUMIN 4.2 10/30/2020 1227   AST 16 02/12/2021 1536   ALT 18 02/12/2021 1536   ALKPHOS 144 (H) 10/30/2020 1227   BILITOT 0.4 02/12/2021 1536   GFRNONAA 52 (L) 02/12/2021 1536   GFRAA 60 02/12/2021 1536    CBC    Component Value Date/Time   WBC 5.9 01/15/2021 1512   RBC 4.32 01/15/2021 1512   HGB 12.4 01/15/2021 1512   HCT 38.1 01/15/2021 1512   PLT 238 01/15/2021 1512   MCV 88.2 01/15/2021 1512   MCH 28.7 01/15/2021 1512   MCHC 32.5 01/15/2021 1512   RDW 12.9 01/15/2021 1512   LYMPHSABS 1,404 01/15/2021 1512   MONOABS 0.4 10/30/2020 1227   EOSABS 89 01/15/2021 1512   BASOSABS 18 01/15/2021 1512    Baseline Immunosuppressant Therapy Labs TB GOLD Quantiferon TB Gold Latest Ref Rng & Units 04/26/2020  Quantiferon TB Gold Plus NEGATIVE NEGATIVE   Hepatitis Panel Hepatitis Latest Ref Rng & Units 04/26/2020  Hep B Surface Ag NON-REACTI NON-REACTIVE  Hep B IgM NON-REACTI NON-REACTIVE  Hep C Ab NON-REACTI -  Hep C Ab NON-REACTI -   HIV Lab Results  Component Value Date   HIV NON-REACTIVE 04/26/2020   HIV Non Reactive 08/23/2017   HIV Non Reactive 04/14/2017   Immunoglobulins Immunoglobulin Electrophoresis Latest Ref Rng & Units 04/26/2020  IgA  70 - 320 mg/dL 120  IgG 600 - 1,540 mg/dL 824  IgM 50 - 300 mg/dL 47(L)   SPEP Serum Protein Electrophoresis Latest Ref Rng & Units  02/12/2021  Total Protein 6.1 - 8.1 g/dL 6.5  Albumin 3.8 - 4.8 g/dL -  Alpha-1 0.2 - 0.3 g/dL -  Alpha-2 0.5 - 0.9 g/dL -  Beta Globulin 0.4 - 0.6 g/dL -  Beta 2 0.2 - 0.5 g/dL -  Gamma Globulin 0.8 - 1.7 g/dL -   G6PD No results found for: G6PDH TPMT No results found for: TPMT   Chest x-ray:  08/22/2017 No active cardiopulmonary disease  Assessment/Plan:   Administrations This Visit     Certolizumab Pegol KIT 400 mg     Admin Date 04/11/2021 Action Given Dose 400 mg Route Subcutaneous Administered By Carole Binning, LPN             Patient tolerated injection well.  Appointment for next injection scheduled for 05/09/2021.  Patient due for labs in today and again in October 2022.  Patient is to call and reschedule appointment if running a fever with signs/symptoms of infection, on antibiotics for active infection or has an upcoming invasive procedure.  All questions encouraged and answered.  Instructed patient to call with any further questions or concerns.

## 2021-04-12 NOTE — Progress Notes (Signed)
Creatinine is elevated and continues to trend up.  GFR is low-47.  Please clarify if the patient has started on any new medications or if she has taken any NSAIDs recently.  It is unlikely that cimzia is worsening her renal function.   Glucose is elevated-168.   Alk phos remains elevated but stable.   CBC WNL.

## 2021-04-13 LAB — COMPLETE METABOLIC PANEL WITH GFR
AG Ratio: 1.6 (calc) (ref 1.0–2.5)
ALT: 20 U/L (ref 6–29)
AST: 13 U/L (ref 10–35)
Albumin: 4 g/dL (ref 3.6–5.1)
Alkaline phosphatase (APISO): 168 U/L — ABNORMAL HIGH (ref 37–153)
BUN/Creatinine Ratio: 10 (calc) (ref 6–22)
BUN: 12 mg/dL (ref 7–25)
CO2: 23 mmol/L (ref 20–32)
Calcium: 9.6 mg/dL (ref 8.6–10.4)
Chloride: 107 mmol/L (ref 98–110)
Creat: 1.19 mg/dL — ABNORMAL HIGH (ref 0.50–0.99)
GFR, Est African American: 54 mL/min/{1.73_m2} — ABNORMAL LOW (ref >=60)
GFR, Est Non African American: 47 mL/min/{1.73_m2} — ABNORMAL LOW (ref >=60)
Globulin: 2.5 g/dL (calc) (ref 1.9–3.7)
Glucose, Bld: 168 mg/dL — ABNORMAL HIGH (ref 65–99)
Potassium: 4.1 mmol/L (ref 3.5–5.3)
Sodium: 140 mmol/L (ref 135–146)
Total Bilirubin: 0.4 mg/dL (ref 0.2–1.2)
Total Protein: 6.5 g/dL (ref 6.1–8.1)

## 2021-04-13 LAB — CBC WITH DIFFERENTIAL/PLATELET
Absolute Monocytes: 279 cells/uL (ref 200–950)
Basophils Absolute: 11 cells/uL (ref 0–200)
Basophils Relative: 0.2 %
Eosinophils Absolute: 97 cells/uL (ref 15–500)
Eosinophils Relative: 1.7 %
HCT: 40.4 % (ref 35.0–45.0)
Hemoglobin: 13.2 g/dL (ref 11.7–15.5)
Lymphs Abs: 1414 cells/uL (ref 850–3900)
MCH: 30 pg (ref 27.0–33.0)
MCHC: 32.7 g/dL (ref 32.0–36.0)
MCV: 91.8 fL (ref 80.0–100.0)
MPV: 10 fL (ref 7.5–12.5)
Monocytes Relative: 4.9 %
Neutro Abs: 3899 cells/uL (ref 1500–7800)
Neutrophils Relative %: 68.4 %
Platelets: 218 10*3/uL (ref 140–400)
RBC: 4.4 10*6/uL (ref 3.80–5.10)
RDW: 12.8 % (ref 11.0–15.0)
Total Lymphocyte: 24.8 %
WBC: 5.7 10*3/uL (ref 3.8–10.8)

## 2021-04-13 LAB — QUANTIFERON-TB GOLD PLUS
Mitogen-NIL: >10 IU/mL
NIL: 0.03 IU/mL
QuantiFERON-TB Gold Plus: NEGATIVE
TB1-NIL: 0 IU/mL
TB2-NIL: 0 IU/mL

## 2021-04-13 NOTE — Progress Notes (Signed)
TB gold negative

## 2021-04-16 ENCOUNTER — Other Ambulatory Visit: Payer: Self-pay | Admitting: Family Medicine

## 2021-04-19 NOTE — Progress Notes (Signed)
Office Visit Note  [name]             Date of Birth: 1950/12/29           MRN: [id number]             PCP: Wynn Banker, MD Referring: Wynn Banker, MD Visit Date: 05/03/2021 Occupation: @GUAROCC @  Subjective:  Increased joint stiffness  History of Present Illness: Judy [name] is a 70 y.o. female with history of ankylosing spondylitis.  Patient is receiving in office Cimzia 400 mg subcutaneous injections every 4 weeks.  Her next injection is scheduled on 05/09/2021.  She states that she has been having increased pain and stiffness over the past several weeks.  She was previously going to the gym and working with a 07/09/2021 but discontinued 3 weeks ago.  According to the patient she was advised to not return to the gym until she got a note from her pulmonologist as well as Systems analyst to clear her to work with a trainer in the future.  She has noticed increased weakness and arthralgias since she has not been at the gym.  She is currently having pain in the left SI joint as well as both knee joints.  She denies any joint swelling at this time.  She is unsure if [place] is working but she has hesitant to make any medication changes at this time.  She would like a note to return to the gym to see if that will improve her symptoms.  Activities of Daily Living:  Patient reports morning stiffness for 15-20 minutes.   Patient Reports nocturnal pain.  Difficulty dressing/grooming: Reports Difficulty climbing stairs: Reports Difficulty getting out of chair: Reports Difficulty using hands for taps, buttons, cutlery, and/or writing: Denies  Review of Systems  Constitutional:  Positive for fatigue.  HENT:  Positive for mouth dryness. Negative for mouth sores and nose dryness.   Eyes:  Positive for dryness. Negative for pain and itching.  Respiratory:  Positive for shortness of breath and difficulty breathing.   Cardiovascular:  Negative for chest pain and  palpitations.  Gastrointestinal:  Negative for blood in stool, constipation and diarrhea.  Endocrine: Negative for increased urination.  Genitourinary:  Negative for difficulty urinating.  Musculoskeletal:  Positive for joint pain, joint pain, joint swelling, myalgias, morning stiffness, muscle tenderness and myalgias.  Skin:  Negative for color change, rash and redness.  Allergic/Immunologic: Negative for susceptible to infections.  Neurological:  Positive for memory loss. Negative for dizziness, numbness and headaches.  Hematological:  Negative for bruising/bleeding tendency.  Psychiatric/Behavioral:  Negative for confusion.    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/04/2019   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   Status post total replacement of left hip 03/19/2019   Avascular necrosis of bone of hip, 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   PTSD (post-traumatic stress disorder)    COPD (chronic obstructive pulmonary disease) (HCC)    Bipolar 1 disorder (HCC)    Anxiety    B12 deficiency 12/09/2018   COPD with acute exacerbation (HCC) 04/14/2017   Hyperglycemia 04/14/2017    Past Medical History:  Diagnosis Date   Anxiety    Arthritis    Avascular necrosis (HCC)    Left hip   Bipolar 1 disorder (HCC)    COPD (chronic  obstructive pulmonary disease) (HCC)    Pneumonia    PTSD (post-traumatic stress disorder)     Family History  Problem Relation Age of Onset   CAD Mother 3080   COPD Mother    Depression Mother    Heart attack Father    Colon polyps Sister    Heart attack Maternal Grandmother    Early death Maternal Grandfather    Hearing loss Paternal Grandfather    Bipolar disorder Son    Asthma Daughter    Past Surgical History:  Procedure Laterality Date   bone spurs foot Right    COLONOSCOPY     HAND SURGERY Left     JOINT REPLACEMENT     TONSILLECTOMY     TONSILLECTOMY AND ADENOIDECTOMY     TOTAL HIP ARTHROPLASTY Left 03/19/2019   Procedure: LEFT TOTAL HIP ARTHROPLASTY ANTERIOR APPROACH;  Surgeon: Kathryne HitchBlackman, Christopher Y, MD;  Location: [facility];  Service: Orthopedics;  Laterality: Left;   TOTAL HIP ARTHROPLASTY Right 06/04/2019   Procedure: RIGHT TOTAL HIP ARTHROPLASTY ANTERIOR APPROACH;  Surgeon: Kathryne HitchBlackman, Christopher Y, MD;  Location: [facility];  Service: Orthopedics;  Laterality: Right;   Social History   Social History Narrative   Not on file   Immunization History  Administered Date(s) Administered   Influenza, High Dose Seasonal PF 08/25/2018   Moderna Sars-Covid-2 Vaccination 11/08/2019, 12/05/2019, 10/09/2020   Pneumococcal Conjugate-13 09/23/2018   Pneumococcal Polysaccharide-23 08/27/2016   Tdap 10/07/2012   Yellow Fever 07/05/2008     Objective: Vital Signs: BP 117/83 (BP Location: Left Arm, Patient Position: Sitting, Cuff Size: Normal)   Pulse 80   Ht 5\' 10"  (1.778 m)   Wt 230 lb (104.3 kg)   BMI 33.00 kg/m    Physical Exam Vitals and nursing note reviewed.  Constitutional:      Appearance: She is well-developed.  HENT:     Head: Normocephalic and atraumatic.  Eyes:     Conjunctiva/sclera: Conjunctivae normal.  Pulmonary:     Effort: Pulmonary effort is normal.  Abdominal:     Palpations: Abdomen is soft.  Musculoskeletal:     Cervical back: Normal range of motion.  Skin:    General: Skin is warm and dry.     Capillary Refill: Capillary refill takes less than 2 seconds.  Neurological:     Mental Status: She is alert and oriented to person, place, and time.  Psychiatric:        Behavior: Behavior normal.     Musculoskeletal Exam: C-spine has limited range of motion.  Postural thoracic kyphosis noted.  Tenderness over the left SI joint.  No midline spinal tenderness noted.  Shoulder joints have good range of motion with no discomfort.  Mild elbow joint contractures  noted.  Wrist joints, MCPs, PIPs, DIPs have good range of motion with no synovitis.  PIP and DIP thickening consistent with osteoarthritis of both hands noted.  She was able to make a complete fist bilaterally.  Painful range of motion of both knee joints.  Ankle joints have good range of motion with no tenderness or inflammation.  CDAI Exam: CDAI Score: -- Patient Global: --; Provider Global: -- Swollen: --; Tender: -- Joint Exam 05/03/2021   No joint exam has been documented for this visit   There is currently no information documented on the homunculus. Go to the Rheumatology activity and complete the homunculus joint exam.  Investigation: No additional findings.  Imaging: DG Chest 2 View  Result Date: 04/26/2021 CLINICAL DATA:  COPD.  Chest x-ray with nipple markers requested. EXAM: CHEST - 2 VIEW COMPARISON:  03/27/2021.  08/22/2017.  CT 11/01/2020. FINDINGS: Chest x-ray with nipple markers obtained. Previously identified questionable density over the left lung base does not represent a nipple shadow. No nodularity noted in the left lung base on today's exam. Previously identified questionable density most likely represents mild atelectasis and or scarring. No acute infiltrate. No pleural effusion or pneumothorax. Old bilateral rib fractures noted. Degenerative change thoracic spine. IMPRESSION: Previously identified questionable density left lung base does not represent a nipple shadow. No nodularity in the left lung base noted on today's exam. Previously identified questionable density most likely represents mild atelectasis and or scarring. Exam is stable from multiple prior exams. No acute cardiopulmonary disease identified. Electronically Signed   By: Maisie Fus  Register   On: 04/26/2021 05:37    Recent Labs: Lab Results  Component Value Date   WBC 5.7 04/11/2021   HGB 13.2 04/11/2021   PLT 218 04/11/2021   NA 140 04/11/2021   K 4.1 04/11/2021   CL 107 04/11/2021   CO2 23  04/11/2021   GLUCOSE 168 (H) 04/11/2021   BUN 12 04/11/2021   CREATININE 1.19 (H) 04/11/2021   BILITOT 0.4 04/11/2021   ALKPHOS 144 (H) 10/30/2020   AST 13 04/11/2021   ALT 20 04/11/2021   PROT 6.5 04/11/2021   ALBUMIN 4.2 10/30/2020   CALCIUM 9.6 04/11/2021   GFRAA 54 (L) 04/11/2021   QFTBGOLDPLUS NEGATIVE 04/11/2021    Speciality Comments: Cimzia completed loading on June 22, 2020  Procedures:  No procedures performed Allergies: Celexa [citalopram hydrobromide] and Gabapentin   Assessment / Plan:     Visit Diagnoses: 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) - C-spine, thoracic spine, and lumbar spine fused:  She has limited range of motion of the C-spine on examination today.  She is not experiencing any midline tenderness.  She does have tenderness over the left SI joint.  She has difficulty rising from a seated position due to pain and stiffness in her lower back and both knees.  She was previously going to [employer] and working with a Psychologist, educational several days a week as well as going to massage therapy.  According to the patient she was advised to not return to [employer] until she had a note from her pulmonologist and rheumatologist clearing her to return to the gym as well as massage therapy.  She has noticed increased deconditioning, stiffness, and arthralgias since she has not gone to the gym in over 3 weeks.  A note was provided to the patient today recommending that she return to the gym but having a weight lifting restriction of 5 pounds.  She has been unsure of Cimzia has been as effective as it was in the past but she would like to return to the gym to see if that improves her overall pain and stiffness.  We will check a sed rate with her next lab work being drawn in August 2022 at her PCPs office.  She will return on 05/09/2021 for her next in office Cimzia injection.  She was advised to notify us if she continues to have increased joint pain and stiffness.   She will follow-up in the office in 4 months.- Plan: Sedimentation rate  High risk medication use - Cimzia 400 mg sq injections every 4 weeks-Administered in the office. Completed loading for cimzia on 06/22/20. She declined self-injections.   CBC and CMP were updated on 04/11/2021.  She will be due to update lab work in October and every 3 months to monitor for toxicity.  TB Gold negative on 04/12/19  DDD (degenerative disc disease), lumbar: Chronic pain and stiffness.  No midline spinal tenderness at this time.  She was strongly encouraged to return to the gym with a weight lifting restriction of 5 pounds maximum.  Spinal stenosis of lumbosacral region: Chronic pain   Chronic SI joint pain: She has tenderness over the left SI joint on exam today. Declined cortisone injection.   Status post total replacement of both hips - She had AVN in bilateral hip joints and required replacement.  Other medical conditions are listed as follows:   Avascular necrosis of bone of hip, left (HCC) - h/o  Avascular necrosis of bone of right hip (HCC) - h/o  Contracture of joint of both elbows: Mild.  No tenderness or inflammation noted.  Bipolar 1 disorder (HCC)  PTSD (post-traumatic stress disorder)  History of COPD - she is followed by Dr. Isaiah Serge.   Pulmonary nodules - Followed by with Dr. Isaiah Serge  B12 deficiency  Former smoker  Orders: Orders Placed This Encounter  Procedures   Sedimentation rate   No orders of the defined types were placed in this encounter.    Follow-Up Instructions: Return in about 4 months (around 09/03/2021) for Ankylosing Spondylitis.   Gearldine Bienenstock, PA-C  Note - This record has been created using Dragon software.  Chart creation errors have been sought, but may not always  have been located. Such creation errors do not reflect on  the standard of medical care.

## 2021-04-24 ENCOUNTER — Telehealth: Payer: Self-pay | Admitting: Family Medicine

## 2021-04-24 NOTE — Telephone Encounter (Signed)
Patient called today stating that she has a new trainer at [employer] and that he is requesting a letter from her PCP letting them know of any restrictions that patient has.  She says that she was told to let Dr. Hassan Rowan know that her trainer is incorporating functional fitness movements for her core, upper and lower body and changing levels.  If a note could be written, she would like it emailed to [email or web address], if possible.  Patient also says that someone called her about repeating lab work but she is not sure if it was this office or not.  Please advise.

## 2021-04-25 ENCOUNTER — Telehealth: Payer: Self-pay | Admitting: Orthopaedic Surgery

## 2021-04-25 ENCOUNTER — Ambulatory Visit (INDEPENDENT_AMBULATORY_CARE_PROVIDER_SITE_OTHER): Payer: Medicare Other

## 2021-04-25 DIAGNOSIS — J449 Chronic obstructive pulmonary disease, unspecified: Secondary | ICD-10-CM | POA: Diagnosis not present

## 2021-04-25 IMAGING — DX DG CHEST 2V
3 series · 3 of 3 positions shown · non-contrast
Comparison: [DATE].  [DATE].  CT [DATE].

CLINICAL DATA: COPD.  Chest x-ray with nipple markers requested.

EXAM:
CHEST - 2 VIEW

[chest pa (1 of 2)]
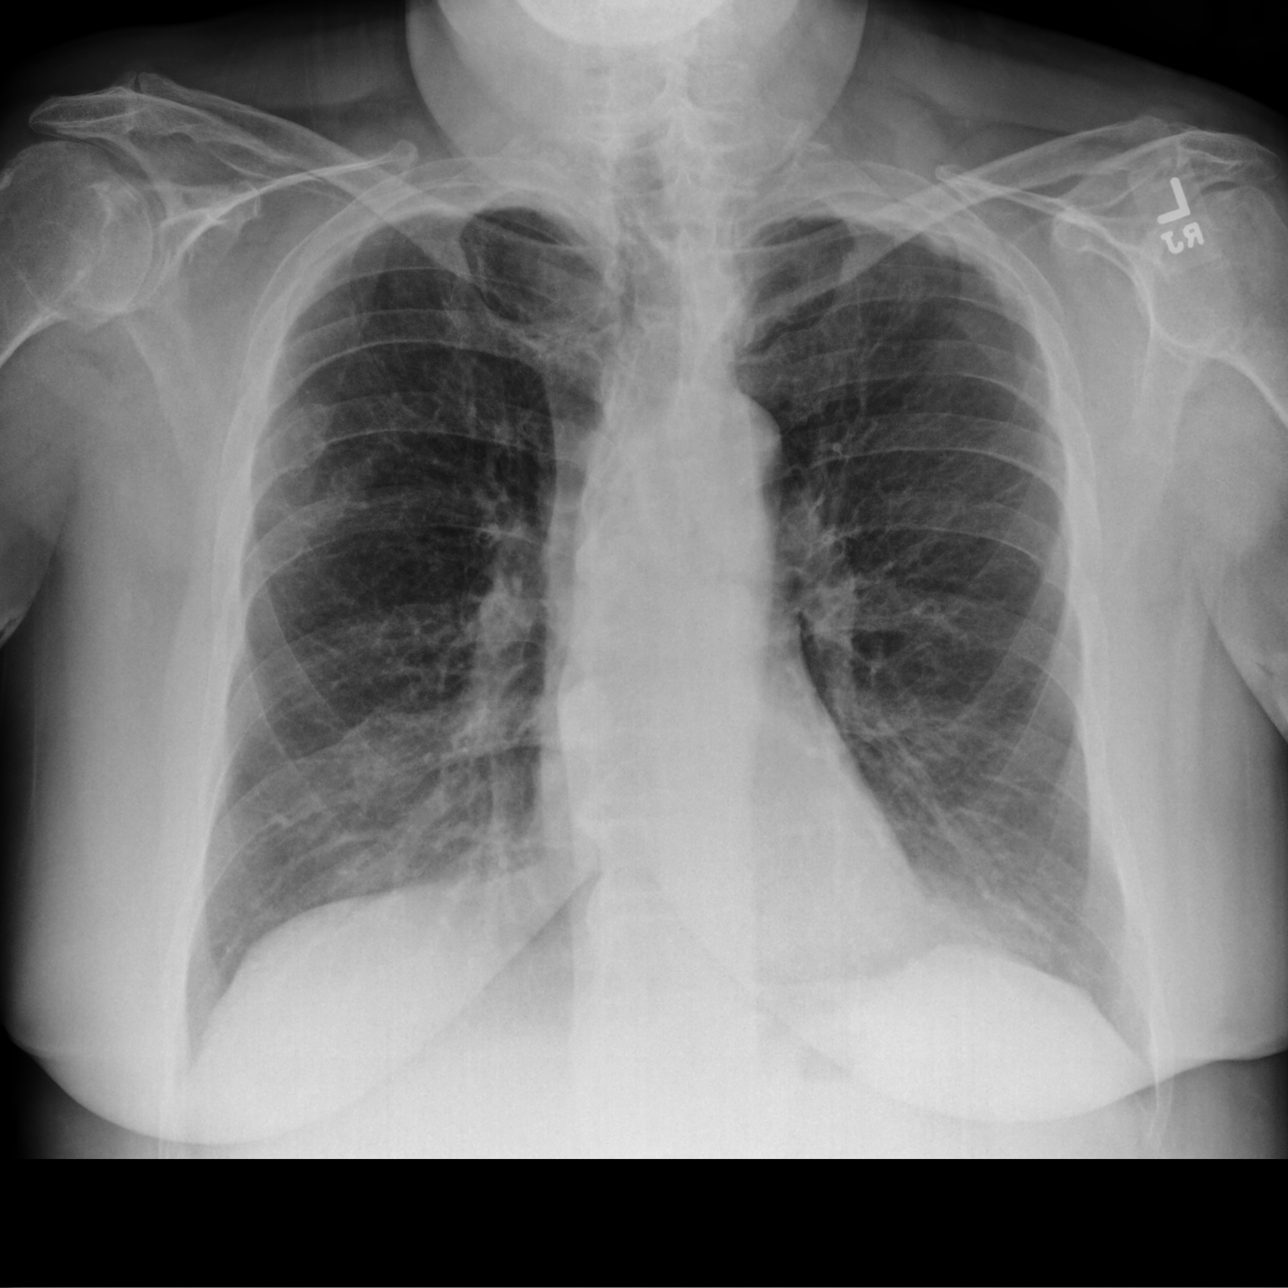

[chest pa (2 of 2)]
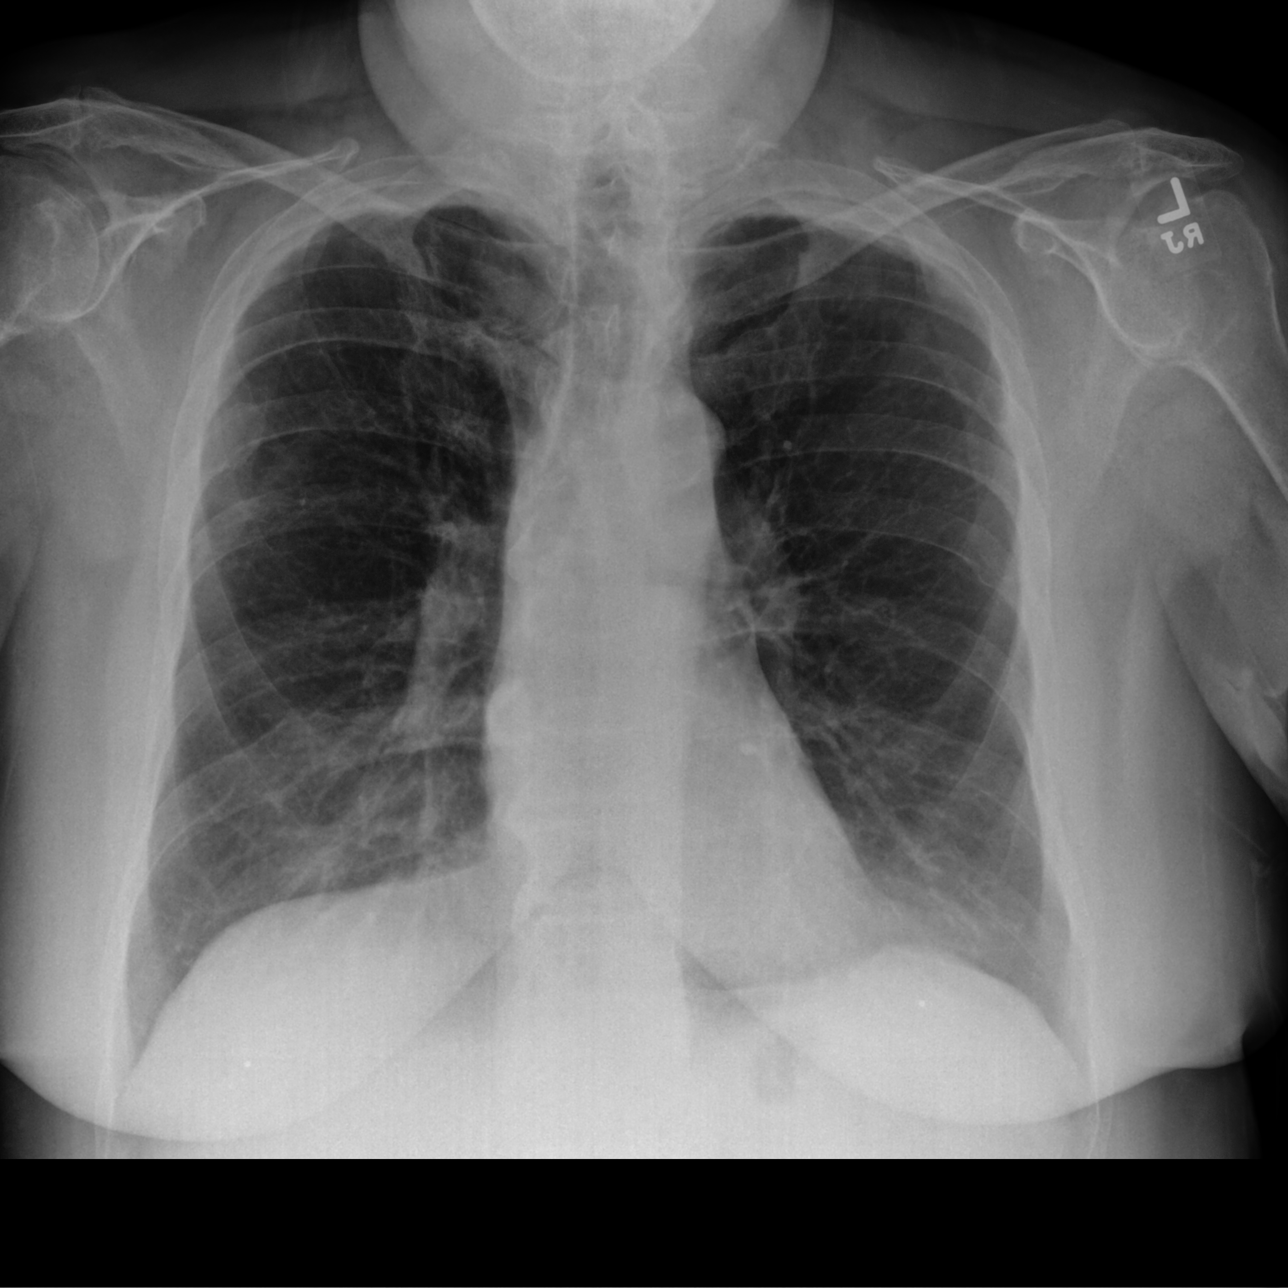

[chest lat]
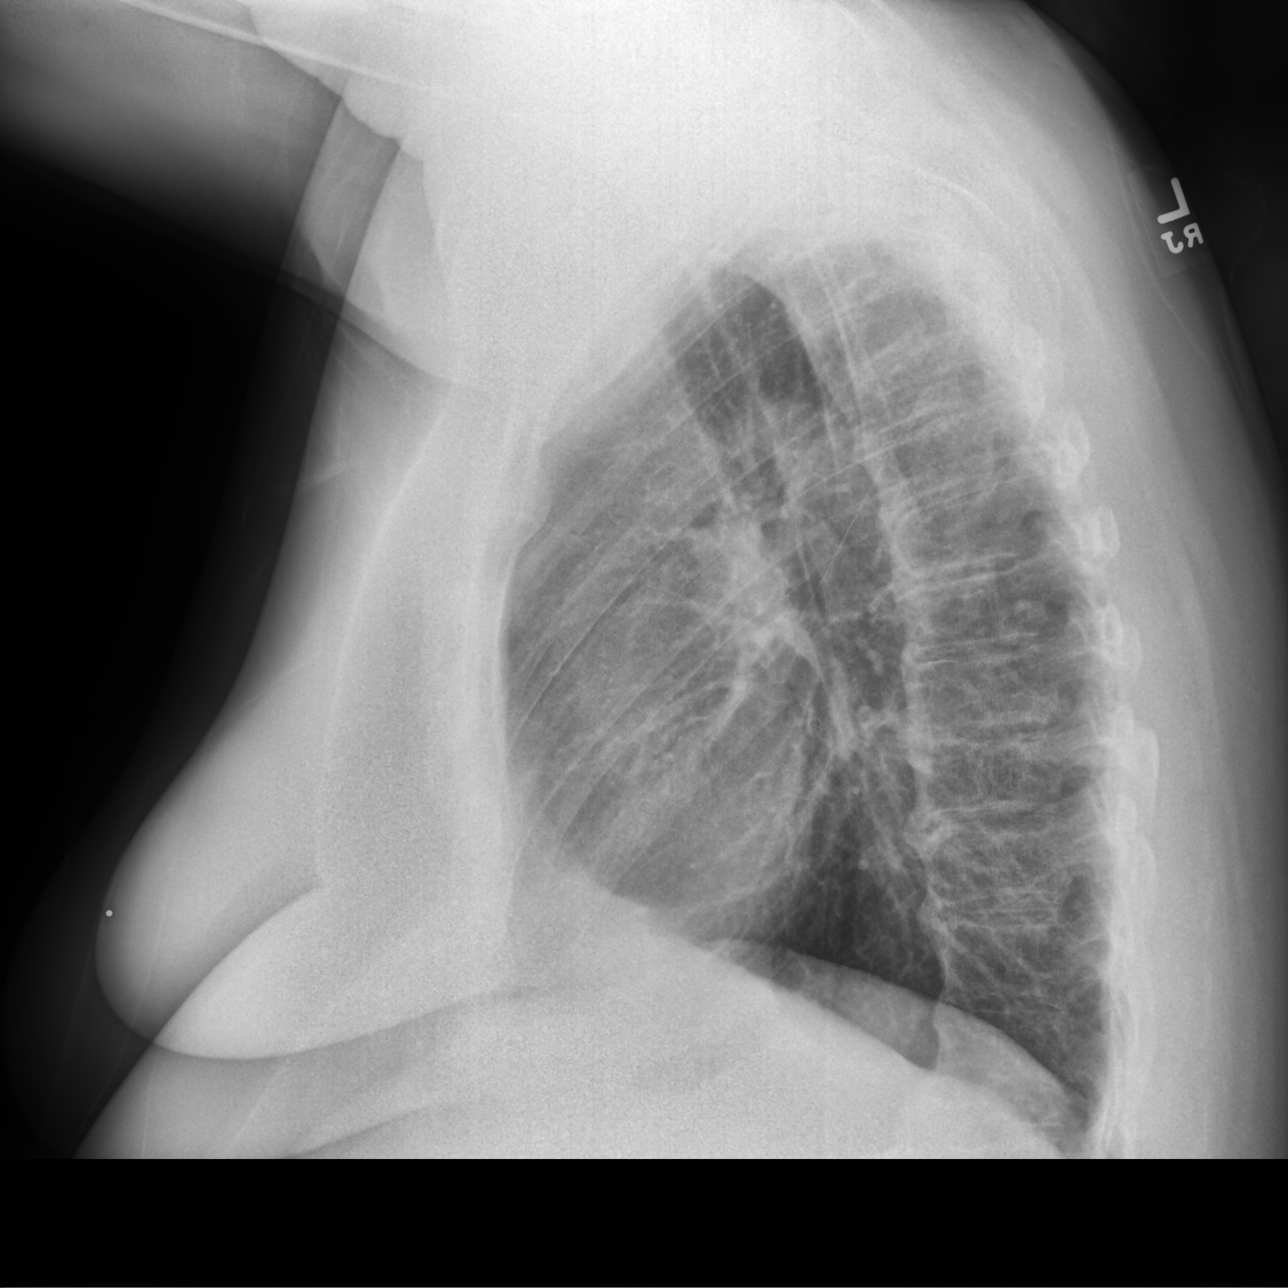

[3 of 3 positions shown; findings below may reference images not displayed]

FINDINGS: Chest x-ray with nipple markers obtained. Previously identified
questionable density over the left lung base does not represent a
nipple shadow. No nodularity noted in the left lung base on today's
exam. Previously identified questionable density most likely
represents mild atelectasis and or scarring. No acute infiltrate. No
pleural effusion or pneumothorax. Old bilateral rib fractures noted.
Degenerative change thoracic spine.
IMPRESSION: Previously identified questionable density left lung base does not
represent a nipple shadow. No nodularity in the left lung base noted
on today's exam. Previously identified questionable density most
likely represents mild atelectasis and or scarring. Exam is stable
from multiple prior exams. No acute cardiopulmonary disease
identified.

## 2021-04-25 NOTE — Telephone Encounter (Signed)
Emailed per patient request

## 2021-04-25 NOTE — Telephone Encounter (Signed)
Please advise 

## 2021-04-25 NOTE — Telephone Encounter (Signed)
Patient called. Says that she has a new trainer at [employer] he is requesting a letter from her  letting them know of any restrictions that patient has.   she would like it emailed to [email or web address]

## 2021-04-27 ENCOUNTER — Encounter: Payer: Self-pay | Admitting: *Deleted

## 2021-04-27 NOTE — Telephone Encounter (Signed)
I left a detailed message at the pts home number with the information below and advised she contact the pulmonary office regarding labs.  Message also left stating the letter was completed and left at the front desk as we are not able to send letters via email.

## 2021-04-30 ENCOUNTER — Telehealth: Payer: Self-pay | Admitting: Pulmonary Disease

## 2021-04-30 NOTE — Telephone Encounter (Signed)
Called and spoke with the pt  She was driving and needed to look at her notes so she will call back tomorrow 7/26

## 2021-05-01 NOTE — Telephone Encounter (Signed)
Called and spoke to pt. Pt states she goes to [employer] and was told by a Systems analyst that she is not to work out unless she has a physicians note saying she is able to. Pt also states she was getting a massage recently and had a panic attack and was told by the massage therapist to bring a letter from the physician giving release it is ok for her to get massages. Pt last seen on 03/27/21 by TP for COPD.    Dr. Isaiah Serge, please advise if ok for pt to have a letter for the gym and for the massage therapist stating she is able to do these activities. Thanks.   --Pt needs 4 copies of the letter(s) mailed to her.

## 2021-05-02 ENCOUNTER — Other Ambulatory Visit: Payer: Self-pay

## 2021-05-02 ENCOUNTER — Ambulatory Visit (INDEPENDENT_AMBULATORY_CARE_PROVIDER_SITE_OTHER): Payer: Medicare Other | Admitting: Family Medicine

## 2021-05-02 ENCOUNTER — Encounter: Payer: Self-pay | Admitting: Family Medicine

## 2021-05-02 ENCOUNTER — Telehealth: Payer: Self-pay | Admitting: Family Medicine

## 2021-05-02 VITALS — BP 124/68 | HR 97 | Temp 97.9°F | Ht 70.0 in | Wt 229.6 lb

## 2021-05-02 DIAGNOSIS — F419 Anxiety disorder, unspecified: Secondary | ICD-10-CM

## 2021-05-02 DIAGNOSIS — J449 Chronic obstructive pulmonary disease, unspecified: Secondary | ICD-10-CM | POA: Diagnosis not present

## 2021-05-02 DIAGNOSIS — Z96641 Presence of right artificial hip joint: Secondary | ICD-10-CM

## 2021-05-02 DIAGNOSIS — Z96642 Presence of left artificial hip joint: Secondary | ICD-10-CM

## 2021-05-02 DIAGNOSIS — M45 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: Secondary | ICD-10-CM | POA: Diagnosis not present

## 2021-05-02 NOTE — Telephone Encounter (Signed)
Dr. Hassan Rowan told the patient where she needed to get records from, however the patient forgot the name of the practice.  She needs a call back so she will know where to get them from.

## 2021-05-02 NOTE — Telephone Encounter (Signed)
I left a detailed message at the patient's cell number with the information below.  ?

## 2021-05-02 NOTE — Telephone Encounter (Signed)
[facility] - sorry I wrote it on the check out section; she couldn't remember who she saw and isn't 100% sure she saw someone but she was sent from ortho there for her neck to be evaluated and their name did come up in chart; I just didn't see office note

## 2021-05-02 NOTE — Progress Notes (Signed)
Judy [name] DOB: Dec 01, 1950 Encounter date: 05/02/2021  This is a 70 y.o. female who presents with Chief Complaint  Patient presents with   Panic Attack    Patient states she had a panic attack yesterday during a massage    History of present illness: Last visit with me was 10/30/20.   Had panic attack during massage - couldn't catch breath. Created whole scene - checking bp, pulse ox. Needs letter to return for additional massage. Last Friday got kicked out of [employer] because needed letter to continue. Has been going there since October. Has had 3 different trainers. They would modify exercises for her to be able to complete. New trainer there was ok at first, but then there was discussion with manager and she had to get letter.   No reason why she had panic attack. She does state that she has had some mini attacks at night - just enough to wake her up, but then rolls over and goes back to sleep. Lasted for 5 minutes; resolved on its own.   2 weeks ago - Friday through sat diarrhea, chills.   Then started on new medication, naltrexone - 19m daily for anxiety[place]   Still pain, feeling "mushy" in legs. Until now felt like she was getting strength back, doing better overall. Has been 3 weeks since being at gym now and feels she has declined. Hard to even walk through [employer]. Back does hurt; has to flex forward to relieve her pain.   Sleeps 16 hours a day. Really hard to enjoy things with pain. Physical pain and limitations limit her ability to do things socially.   Massage is very helpful for feeling better; just feels like pile of mush after.   Has bone spurs in neck.   Allergies  Allergen Reactions   Celexa [Citalopram Hydrobromide]     "goes weird"   Gabapentin     "goes weird"    Current Meds  Medication Sig   Acetaminophen (TYLENOL 8 HOUR PO) Take 500 mg by mouth.   albuterol (VENTOLIN HFA) 108 (90 Base) MCG/ACT inhaler Inhale 1-2  puffs into the lungs every 6 (six) hours as needed for wheezing or shortness of breath.   atorvastatin (LIPITOR) 40 MG tablet Take 1 tablet by mouth once daily   cariprazine (VRAYLAR) capsule Take 6 mg by mouth daily.    Certolizumab Pegol (CIMZIA PREFILLED) 2 X 200 MG/ML KIT Inject 400 mg into the skin every 30 (thirty) days.    Cyanocobalamin (VITAMIN B-12 PO) Take by mouth daily.   LORazepam (ATIVAN) 1 MG tablet Take 3 mg by mouth daily.   lurasidone (LATUDA) 80 MG TABS tablet Take 80 mg by mouth every evening.    QUEtiapine (SEROQUEL) 400 MG tablet Take 800 mg by mouth at bedtime.   umeclidinium-vilanterol (ANORO ELLIPTA) 62.5-25 MCG/INH AEPB Inhale 1 puff into the lungs daily.    Review of Systems  Constitutional:  Positive for fatigue. Negative for chills and fever.  Respiratory:  Positive for shortness of breath (at baseline). Negative for cough, chest tightness and wheezing.   Cardiovascular:  Negative for chest pain, palpitations and leg swelling.  Musculoskeletal:  Positive for arthralgias and back pain.  Psychiatric/Behavioral:  The patient is nervous/anxious (doesn't feel worse than baseline; chronic anxiety).    Objective:  BP 124/68 (BP Location: Left Arm, Patient Position: Sitting, Cuff Size: Large)   Pulse 97   Temp 97.9 F (36.6 C) (Oral)   Ht  '5\' 10"'  (1.778 m)   Wt 229 lb 9.6 oz (104.1 kg)   SpO2 95%   BMI 32.94 kg/m   Weight: 229 lb 9.6 oz (104.1 kg)   BP Readings from Last 3 Encounters:  05/02/21 124/68  04/11/21 119/86  03/27/21 120/76   Wt Readings from Last 3 Encounters:  05/02/21 229 lb 9.6 oz (104.1 kg)  03/27/21 226 lb 12.8 oz (102.9 kg)  02/12/21 225 lb 6.4 oz (102.2 kg)    Physical Exam Constitutional:      General: She is not in acute distress.    Appearance: She is well-developed.  Cardiovascular:     Rate and Rhythm: Normal rate and regular rhythm.     Heart sounds: Normal heart sounds. No murmur heard.   No friction rub.  Pulmonary:      Effort: Pulmonary effort is normal. No respiratory distress.     Breath sounds: Normal breath sounds. No wheezing or rales.     Comments: Pursed lip breathing, chronic Musculoskeletal:     Right lower leg: No edema.     Left lower leg: No edema.  Neurological:     Mental Status: She is alert and oriented to person, place, and time.  Psychiatric:        Behavior: Behavior normal.    Assessment/Plan  1. Chronic obstructive pulmonary disease, unspecified COPD type ([place]) Breathing has been stable. She continues to be smoke- free. She follows with pulmonology. Continue with anoro.   2. 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 [facility]) Chronic source of pain for her. She does best with regular activity and really needs to be exercising daily to maintain strength and flexibility as well as pain control.  Letter was written for her today to be able to continue sessions with trainer.  Letter also written for her to be able to continue with massage, as this helps with some of the paraspinal muscle spasms she has.  3. Anxiety This is chronic for her.  She follows with psychiatry.  Panic attacks are not unusual for her and she typically is able to manage these without taking medication.  4. Status post total replacement of left hip Some limitation in range of motion and exercise ability due to replacement, but certainly benefits from continuing to work on strengthening and support for hips and lower extremities.  See letter created today.  5. Status post total replacement of right hip See above.   Return in about 3 months (around 08/02/2021) for Chronic condition visit.  50 minutes spent with patient reviewing current condition, benefits of massage and personal training, crafting letter to allow her to continue with these activities. We discussed how regular exercise helps with mood and energy. She feels these will be better once she can get back into routine and will keep me posted if this  is not the case.    Micheline Rough, MD

## 2021-05-03 ENCOUNTER — Encounter: Payer: Self-pay | Admitting: Physician Assistant

## 2021-05-03 ENCOUNTER — Ambulatory Visit (INDEPENDENT_AMBULATORY_CARE_PROVIDER_SITE_OTHER): Payer: Medicare Other | Admitting: Physician Assistant

## 2021-05-03 VITALS — BP 117/83 | HR 80 | Ht 70.0 in | Wt 230.0 lb

## 2021-05-03 DIAGNOSIS — F319 Bipolar disorder, unspecified: Secondary | ICD-10-CM

## 2021-05-03 DIAGNOSIS — Z79899 Other long term (current) drug therapy: Secondary | ICD-10-CM

## 2021-05-03 DIAGNOSIS — M24522 Contracture, left elbow: Secondary | ICD-10-CM

## 2021-05-03 DIAGNOSIS — M533 Sacrococcygeal disorders, not elsewhere classified: Secondary | ICD-10-CM

## 2021-05-03 DIAGNOSIS — G8929 Other chronic pain: Secondary | ICD-10-CM

## 2021-05-03 DIAGNOSIS — Z96643 Presence of artificial hip joint, bilateral: Secondary | ICD-10-CM

## 2021-05-03 DIAGNOSIS — M4807 Spinal stenosis, lumbosacral region: Secondary | ICD-10-CM

## 2021-05-03 DIAGNOSIS — Z87891 Personal history of nicotine dependence: Secondary | ICD-10-CM

## 2021-05-03 DIAGNOSIS — Z8709 Personal history of other diseases of the respiratory system: Secondary | ICD-10-CM

## 2021-05-03 DIAGNOSIS — M87051 Idiopathic aseptic necrosis of right femur: Secondary | ICD-10-CM

## 2021-05-03 DIAGNOSIS — M45 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: Secondary | ICD-10-CM | POA: Diagnosis not present

## 2021-05-03 DIAGNOSIS — F4310 Post-traumatic stress disorder, unspecified: Secondary | ICD-10-CM

## 2021-05-03 DIAGNOSIS — M24521 Contracture, right elbow: Secondary | ICD-10-CM

## 2021-05-03 DIAGNOSIS — E538 Deficiency of other specified B group vitamins: Secondary | ICD-10-CM

## 2021-05-03 DIAGNOSIS — R918 Other nonspecific abnormal finding of lung field: Secondary | ICD-10-CM

## 2021-05-03 DIAGNOSIS — M87052 Idiopathic aseptic necrosis of left femur: Secondary | ICD-10-CM

## 2021-05-03 DIAGNOSIS — M5136 Other intervertebral disc degeneration, lumbar region: Secondary | ICD-10-CM

## 2021-05-03 NOTE — Patient Instructions (Signed)
Standing Labs We placed an order today for your standing lab work.   Please have your standing labs drawn in October and every 3 months   If possible, please have your labs drawn 2 weeks prior to your appointment so that the provider can discuss your results at your appointment.  Please note that you may see your imaging and lab results in MyChart before we have reviewed them. We may be awaiting multiple results to interpret others before contacting you. Please allow our office up to 72 hours to thoroughly review all of the results before contacting the office for clarification of your results.  We have open lab daily: Monday through Thursday from 1:30-4:30 PM and Friday from 1:30-4:00 PM at the office of Dr. Shaili Deveshwar, [facility].   Please be advised, all patients with office appointments requiring lab work will take precedent over walk-in lab work.  If possible, please come for your lab work on Monday and Friday afternoons, as you may experience shorter wait times. The office is located at [address], [address], [place], [place] [address] No appointment is necessary.   Labs are drawn by [facility]. Please bring your co-pay at the time of your lab draw.  You may receive a bill from [facility] for your lab work.  If you wish to have your labs drawn at another location, please call the office 24 hours in advance to send orders.  If you have any questions regarding directions or hours of operation,  please call [phone].   As a reminder, please drink plenty of water prior to coming for your lab work. Thanks!  

## 2021-05-04 ENCOUNTER — Telehealth: Payer: Self-pay

## 2021-05-04 NOTE — Telephone Encounter (Signed)
Patient is aware of below message and voiced her understanding. Letter has been written and mailed to address on file per patient request.  Nothing further needed at this time.

## 2021-05-04 NOTE — Telephone Encounter (Signed)
Patient left a voicemail stating Judy [name] gave her a prescription for labwork, but she forgot where she is suppose to get it done at.  Patient requested a return call.

## 2021-05-04 NOTE — Telephone Encounter (Signed)
I called patient, office note states PCP

## 2021-05-04 NOTE — Telephone Encounter (Signed)
Ok to give letter that attend gym and get massages

## 2021-05-09 ENCOUNTER — Ambulatory Visit: Payer: Medicare Other

## 2021-05-10 ENCOUNTER — Telehealth: Payer: Self-pay

## 2021-05-10 ENCOUNTER — Encounter: Payer: Self-pay | Admitting: Family Medicine

## 2021-05-10 NOTE — Telephone Encounter (Signed)
Patient needs a more detailed letter for the gym.  Patient would like a call back.

## 2021-05-10 NOTE — Telephone Encounter (Signed)
Spoke with the patient for more information.  Patient stated the trainer needs a complete list of the exercises she can and cannot do.  Message sent to PCP.

## 2021-05-11 NOTE — Telephone Encounter (Signed)
We had included avoiding deep squats and lunges in her letter for the trainer previously. Sounds like they are being pretty particular. Do they have a form that we could complete which has exercises they routinely do? Another thought is having her see a trainer at a different facility that may be more flexible with working with her/chronic conditions. I can give her a name if she is willing.

## 2021-05-11 NOTE — Telephone Encounter (Signed)
Spoke with the patient and informed her of the message below.  Patient stated she does not have a form and would like info for a different facility.  Message sent to PCP.

## 2021-05-11 NOTE — Telephone Encounter (Signed)
*  she could try the new [facility]. They are doing comprehensive eval and would work with her for trainer and they are linked within our health system so would be aware of health issues/limitations and familiar with working around/with these.   *[facility] ([place] there is a trainer there [address] who is very comfortable with working with ortho limitations and would be willing to take her on as a client.

## 2021-05-14 NOTE — Telephone Encounter (Signed)
Spoke with the patient and informed her of the message below.  Patient stated she is on the road at this time and I advised her she can google the info for [facility] and advised her of the address for [employer].

## 2021-05-18 ENCOUNTER — Telehealth: Payer: Self-pay | Admitting: *Deleted

## 2021-05-18 NOTE — Telephone Encounter (Signed)
Attempted to contact the patient to reschedule her Cimzia injection. Left message for patient to call the office.  

## 2021-05-23 ENCOUNTER — Ambulatory Visit: Payer: Medicare Other | Admitting: Family Medicine

## 2021-05-30 ENCOUNTER — Ambulatory Visit: Payer: Medicare Other | Admitting: Family Medicine

## 2021-05-30 NOTE — Progress Notes (Deleted)
  Judy [name] DOB: May 09, 1951 Encounter date: 05/30/2021  This is a 70 y.o. female who presents with No chief complaint on file.   History of present illness:  HPI   Allergies  Allergen Reactions   Celexa [Citalopram Hydrobromide]     "goes weird"   Gabapentin     "goes weird"    No outpatient medications have been marked as taking for the 05/30/21 encounter (Appointment) with Wynn Banker, MD.    Review of Systems  Objective:  There were no vitals taken for this visit.      BP Readings from Last 3 Encounters:  05/03/21 117/83  05/02/21 124/68  04/11/21 119/86   Wt Readings from Last 3 Encounters:  05/03/21 230 lb (104.3 kg)  05/02/21 229 lb 9.6 oz (104.1 kg)  03/27/21 226 lb 12.8 oz (102.9 kg)    Physical Exam  Assessment/Plan  There are no diagnoses linked to this encounter.       Theodis Shove, MD

## 2021-06-20 ENCOUNTER — Telehealth: Payer: Self-pay | Admitting: *Deleted

## 2021-06-20 ENCOUNTER — Telehealth: Payer: Self-pay

## 2021-06-20 NOTE — Telephone Encounter (Signed)
Patient left a voicemail stating she is overdue for her Cimzia injection and requested a return call.

## 2021-06-20 NOTE — Telephone Encounter (Signed)
Attempted to contact the patient to reschedule her Cimzia injection. Left message for patient to call the office.

## 2021-06-20 NOTE — Telephone Encounter (Signed)
Attempted to contact the patient and left message for patient to call the office.  

## 2021-06-20 NOTE — Telephone Encounter (Signed)
Spoke with patient and rescheduled her Cimzia injection for 06/26/2021. Patient denies recent infection. Patient states she does have a procedure coming up to have cataracts removed. Will she need to postpone her Cimzia?

## 2021-06-20 NOTE — Telephone Encounter (Signed)
She should hold off Cimzia for at least 2 weeks after the cataract surgery.  She may resume Cimzia when she gets clearance from the ophthalmologist.

## 2021-06-21 NOTE — Telephone Encounter (Signed)
Patient advised per Dr. Corliss Skains, she should hold off Cimzia for at least 2 weeks after the cataract surgery.  She may resume Cimzia when she gets clearance from the ophthalmologist. Patient will call once she receives clearance from her ophthalmologist.

## 2021-06-25 ENCOUNTER — Other Ambulatory Visit: Payer: Self-pay

## 2021-06-25 ENCOUNTER — Encounter: Payer: Self-pay | Admitting: Orthopaedic Surgery

## 2021-06-25 ENCOUNTER — Ambulatory Visit (INDEPENDENT_AMBULATORY_CARE_PROVIDER_SITE_OTHER): Payer: Medicare Other | Admitting: Orthopaedic Surgery

## 2021-06-25 DIAGNOSIS — G8929 Other chronic pain: Secondary | ICD-10-CM

## 2021-06-25 DIAGNOSIS — Z96642 Presence of left artificial hip joint: Secondary | ICD-10-CM | POA: Diagnosis not present

## 2021-06-25 DIAGNOSIS — M542 Cervicalgia: Secondary | ICD-10-CM | POA: Diagnosis not present

## 2021-06-25 DIAGNOSIS — M545 Low back pain, unspecified: Secondary | ICD-10-CM | POA: Diagnosis not present

## 2021-06-25 DIAGNOSIS — Z96641 Presence of right artificial hip joint: Secondary | ICD-10-CM | POA: Diagnosis not present

## 2021-06-25 MED ORDER — MELOXICAM 15 MG PO TABS: 15.0000 mg | ORAL_TABLET | Freq: Every day | ORAL | 1 refills | Status: DC

## 2021-06-25 NOTE — Progress Notes (Signed)
The patient is well-known to our clinic.  We have replaced both of her hips.  She is 69 years old actually appears younger.  She does workout at [employer] but she comes in today with neck pain as well as low back pain with no radicular components.  She feels like just her legs are weak in general.  She has had no other acute change in medical status.  She does see her rheumatologist in a week.  On exam both hips move smoothly and fluidly.  Neither knee has an effusion.  She does have some generalized deconditioning of the lower extremities.  There is pain with range of motion of her cervical and lumbar spine.  There is no radicular components of the upper or lower extremities.  I feel that she would benefit most from outpatient physical therapy on her cervical spine and lumbar spine as well as bilateral lower extremity strengthening.  They can then help with designing an exercise program later that she can take back to [employer].  Any modalities per the therapist discretion will be warranted for her neck and lumbar spine.  I will start her on meloxicam as well.  We will see her back in 6 weeks to see how she is doing overall.  All questions and concerns were answered and addressed.

## 2021-06-27 ENCOUNTER — Ambulatory Visit: Payer: Medicare Other

## 2021-07-04 ENCOUNTER — Encounter: Payer: Self-pay | Admitting: Pulmonary Disease

## 2021-07-04 ENCOUNTER — Ambulatory Visit (INDEPENDENT_AMBULATORY_CARE_PROVIDER_SITE_OTHER): Payer: Medicare Other | Admitting: Pulmonary Disease

## 2021-07-04 ENCOUNTER — Other Ambulatory Visit: Payer: Self-pay

## 2021-07-04 VITALS — BP 138/74 | HR 94 | Temp 97.8°F | Ht 70.0 in | Wt 233.2 lb

## 2021-07-04 DIAGNOSIS — J449 Chronic obstructive pulmonary disease, unspecified: Secondary | ICD-10-CM

## 2021-07-04 MED ORDER — ANORO ELLIPTA 62.5-25 MCG/INH IN AEPB: 1.0000 | INHALATION_SPRAY | Freq: Every day | RESPIRATORY_TRACT | 4 refills | Status: DC

## 2021-07-04 MED ORDER — ALBUTEROL SULFATE HFA 108 (90 BASE) MCG/ACT IN AERS: 1.0000 | INHALATION_SPRAY | Freq: Four times a day (QID) | RESPIRATORY_TRACT | 5 refills | Status: DC | PRN

## 2021-07-04 NOTE — Addendum Note (Signed)
Addended by: Jacquiline Doe on: 07/04/2021 02:22 PM   Modules accepted: Orders

## 2021-07-04 NOTE — Patient Instructions (Signed)
Continue the anoro We will renew the albuterol rescue inhaler Follow-up in 1 year

## 2021-07-04 NOTE — Progress Notes (Signed)
Judy [name]    [id number]    1951/04/25  Primary Care Physician:Koberlein, Steele Berg, MD  Referring Physician: Caren Macadam, MD [place],  [place] [id number]  Chief complaint: Follow up for COPD  HPI: 70 year old with anxiety, PTSD, bipolar, COPD, asthma  Complains of dyspnea on exertion for the past 1 year.  She has symptoms on exertion, mild symptoms at rest, daily cough with clear mucus, sinus drainage.  She has had multiple hospitalizations over the past year for COPD exacerbations.  Given inhalers including Advair and Symbicort but does not feel it is helping.  Previously evaluated by pulmonary at [facility] and is here for second opinion.  Hospitalized in July 2018 for COPD exacerbation Hospitalized in November 2018 for COPD exacerbation, CT during both admissions were is negative for PE but showed bilateral groundglass opacities.  She was treated with antibiotics, oral prednisone.  Virus panel was positive for adenovirus at that time.  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 Smoking history: 50-pack-year smoker.  Quit smoking in early 2022 Travel history: Originally  from [place].  No significant recent travel Relevant family history: Daughter has asthma.  No other significant family history of lung disease.  Interim history: Dulera changed to anoro at last visit.  She feels that anoro works better for her Trying to work out at [employer] regularly Complains of chronic dyspnea on exertion  Outpatient Encounter Medications as of 07/04/2021  Medication Sig   Acetaminophen (TYLENOL 8 HOUR PO) Take 500 mg by mouth.   atorvastatin (LIPITOR) 40 MG tablet Take 1 tablet by mouth once daily   cariprazine (VRAYLAR) capsule Take 6 mg by mouth daily.    Certolizumab Pegol (CIMZIA PREFILLED) 2 X 200 MG/ML  KIT Inject 400 mg into the skin every 30 (thirty) days.    Cyanocobalamin (VITAMIN B-12 PO) Take by mouth daily.   LORazepam (ATIVAN) 1 MG tablet Take 3 mg by mouth daily.   lurasidone (LATUDA) 80 MG TABS tablet Take 80 mg by mouth every evening.    meloxicam (MOBIC) 15 MG tablet Take 1 tablet (15 mg total) by mouth daily.   naltrexone (DEPADE) 50 MG tablet Take 50 mg by mouth every morning.   QUEtiapine (SEROQUEL) 400 MG tablet Take 800 mg by mouth at bedtime.   umeclidinium-vilanterol (ANORO ELLIPTA) 62.5-25 MCG/INH AEPB Inhale 1 puff into the lungs daily.   albuterol (VENTOLIN HFA) 108 (90 Base) MCG/ACT inhaler Inhale 1-2 puffs into the lungs every 6 (six) hours as needed for wheezing or shortness of breath. (Patient not taking: Reported on 07/04/2021)   No facility-administered encounter medications on file as of 07/04/2021.   Physical Exam: Blood pressure 138/74, pulse 94, temperature 97.8 F (36.6 C), temperature source Oral, height '5\' 10"'  (1.778 m), weight 233 lb 3.2 oz (105.8 kg), SpO2 97 %. Gen:      No acute distress HEENT:  EOMI, sclera anicteric Neck:     No masses; no thyromegaly Lungs:    Clear to auscultation bilaterally; normal respiratory effort CV:         Regular rate and rhythm; no murmurs Abd:      + bowel sounds; soft, non-tender; no palpable masses, no distension Ext:    No edema; adequate peripheral perfusion Skin:  Warm and dry; no rash Neuro: alert and oriented x 3 Psych: normal mood and affect   Data Reviewed: Imaging: CTA 04/15/2017-no pulmonary embolism, patchy upper lobe groundglass opacities CTA 08/23/2018-no PE, patchy bilateral groundglass opacities, predominantly in the bases. High-resolution CT 08/14/2018-no ILD, bronchial wall thickening with atherosclerosis, small pulmonary nodules Screening CT chest 11/01/2020-mild emphysema, stable pulmonary nodules. I have reviewed the images personally.  PFTs: Spirometry 05/18/18 FEV1 1.76, F/F 65.  Moderate  obstruction.  08/10/18 FVC 3.31 [94%), FEV1 2.25 [83%), F/F 68, TLC 83%, DLCO 61% Mild obstruction with moderate diffusion defect.  FENO 08/05/2018-8  Labs: CBC 08/25/2017-WBC 9.6, eos 0% CBC 08/05/2018- WBC 9.1, eos 0.6%, absolute eosinophil count 55  IgE 08/10/2018-79 Alpha-1 antitrypsin 08/10/2018-192, PI MM  Assessment:  Mild COPD PFTs reviewed with mild obstruction, no bronchodilator response.  Labs show normal IgE and low peripheral eosinophils. She has a diagnosis of asthma but this looks more like COPD.  We will switch her from [facility] to anoro.  Does not need to be on inhaled corticosteroids No desats on exertion. Continue exercise regimen  Abnormal CT, nodules Prior CTs noted with patchy bilateral groundglass opacities.  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  Follow-up high res CT does not show interstitial lung disease. Continue annual low-dose screening CT chest   Health maintenance 08/27/2016-Pneumovax   Plan/Recommendations: - Continue anoro  Follow-up in 1 year.  Marshell Garfinkel MD [facility] 07/04/2021, 2:08 PM  CC: Caren Macadam, MD

## 2021-07-11 ENCOUNTER — Encounter: Payer: Self-pay | Admitting: Rehabilitative and Restorative Service Providers"

## 2021-07-11 ENCOUNTER — Other Ambulatory Visit: Payer: Self-pay

## 2021-07-11 ENCOUNTER — Ambulatory Visit (INDEPENDENT_AMBULATORY_CARE_PROVIDER_SITE_OTHER): Payer: Medicare Other | Admitting: Rehabilitative and Restorative Service Providers"

## 2021-07-11 DIAGNOSIS — R293 Abnormal posture: Secondary | ICD-10-CM

## 2021-07-11 DIAGNOSIS — G8929 Other chronic pain: Secondary | ICD-10-CM

## 2021-07-11 DIAGNOSIS — M542 Cervicalgia: Secondary | ICD-10-CM

## 2021-07-11 DIAGNOSIS — R262 Difficulty in walking, not elsewhere classified: Secondary | ICD-10-CM | POA: Diagnosis not present

## 2021-07-11 DIAGNOSIS — M545 Low back pain, unspecified: Secondary | ICD-10-CM

## 2021-07-11 DIAGNOSIS — M6281 Muscle weakness (generalized): Secondary | ICD-10-CM | POA: Diagnosis not present

## 2021-07-11 NOTE — Therapy (Signed)
[facility] [address] [place], [place], [address] Phone: [phone]   Fax:  [phone]  Physical Therapy Evaluation  Patient Details  Name: Judy [name] MRN: [id number] Date of Birth: Oct 01, 1951 Referring Provider (PT): [name], MD   Encounter Date: 07/11/2021   PT End of Session - 07/11/21 1314     Visit Number 1    Number of Visits 20    Date for PT Re-Evaluation 09/19/21    Authorization Type Medicare, [employer] of [place], [place] at 15    Progress Note Due on Visit 10    PT Start Time 1320    PT Stop Time 1345    PT Time Calculation (min) 25 min    Activity Tolerance Patient tolerated treatment well    Behavior During Therapy 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   Bipolar 1 disorder (HCC)    COPD (chronic obstructive pulmonary disease) (HCC)    Pneumonia    PTSD (post-traumatic stress disorder)     Past Surgical History:  Procedure Laterality Date   bone spurs foot Right    COLONOSCOPY     HAND SURGERY Left    JOINT REPLACEMENT     TONSILLECTOMY     TONSILLECTOMY AND ADENOIDECTOMY     TOTAL HIP ARTHROPLASTY Left 03/19/2019   Procedure: LEFT TOTAL HIP ARTHROPLASTY ANTERIOR APPROACH;  Surgeon: [name], MD;  Location: [facility];  Service: Orthopedics;  Laterality: Left;   TOTAL HIP ARTHROPLASTY Right 06/04/2019   Procedure: RIGHT TOTAL HIP ARTHROPLASTY ANTERIOR APPROACH;  Surgeon: [name], MD;  Location: [facility];  Service: Orthopedics;  Laterality: Right;    There were no vitals filed for this visit.    Subjective Assessment - 07/11/21 1317     Subjective Referral for cervical pain, low back pain, weakness in bilateral leg s/p hip replacements.  Pt.  indicated after hip replacements she was going to [employer] and was using trainers for a period but the trainer switches and told her she couldn't come in until cleared by MD office.  Pt. stated she felt she got worse during time not doing workouts. Pt. stated she chief complaints of back pain with complaints c neck movements at times and weakness in legs too.    Pertinent History COPD, PTSD, history bilateral hip replacement (about 2 years ago per Pt.)    Limitations Sitting;Lifting;Standing;Walking;House hold activities    How long can you sit comfortably? 1 hr    How long can you walk comfortably? 5 mins    Patient Stated Goals Reduce pain, get stronger    Currently in Pain? Yes    Pain Score 7     Pain Location Back    Pain Orientation Lower    Pain Descriptors / Indicators Burning;Tightness    Pain Type Chronic pain    Pain Onset More than a month ago    Pain Frequency Intermittent    Aggravating Factors  sit, stand, walk,    Pain Relieving Factors lying down    Multiple Pain Sites Yes    Pain Score 7   at worst   Pain Location Neck    Pain  Orientation Posterior    Pain Descriptors / Indicators Tightness    Pain Type Chronic pain    Pain Onset More than a month ago    Pain Frequency Intermittent    Aggravating Factors  tightness with movement    Pain Relieving Factors resting                [facility] PT Assessment - 07/11/21 0001       Assessment   Medical Diagnosis M54.2 (ICD-10-CM) - Cervicalgia  M54.50,G89.29 (ICD-10-CM) - Chronic bilateral low back pain without sciatica  Z96.642 (ICD-10-CM) - Status post total replacement of left hip  Z96.641 (ICD-10-CM) - Status post total replacement of right hip    Referring Provider (PT) [name], MD    Onset Date/Surgical Date 05/07/21    Hand Dominance Right      Precautions   Precautions None      Balance Screen   Has the patient fallen in the past 6 months No    Has the patient had a decrease in  activity level because of a fear of falling?  No    Is the patient reluctant to leave their home because of a fear of falling?  No      Home Environment   Additional Comments 4 stairs c rail. lives alone      Prior Function   Vocation Requirements Doesn't work    Leisure Cooking at home, housework, used to go to gym      Cognition   Overall Cognitive Status Within Functional Limits for tasks assessed      Observation/Other Assessments   Focus on Therapeutic Outcomes (FOTO)  intake 34%, predicted 48%      Sensation   Light Touch Appears Intact      Functional Tests   Functional tests Sit to Stand;Single leg stance;Other      Single Leg Stance   Comments able to try and self correct balance loss but unable to hold > 3 seconds bilaterally      Sit to Stand   Comments 18 inch chair 2 tries without UE prior to success      Other:   Other/ Comments step to gait pattern on stairs c Rt leading up      Posture/Postural Control   Posture Comments Mild forward trunk lean, increased thoracic kyphosis/forward head      ROM / Strength   AROM / PROM / Strength Strength;PROM;AROM      AROM   AROM Assessment Site Cervical;Lumbar;Hip    Right/Left Hip Right;Left    Cervical Flexion 15   pulling pain   Cervical Extension 40   pulling pain   Cervical - Right Rotation 34    Cervical - Left Rotation 29    Lumbar Flexion movement to knees, pulling in back of legs bilateral    Lumbar Extension 50% WFL, pulling in anterior hip      Strength   Overall Strength Comments Seated hip abduction static resistance testing weak bilateral    Strength Assessment Site Hip;Knee;Shoulder    Right/Left Shoulder Left;Right    Right/Left Hip Left;Right    Right Hip Flexion 5/5    Left Hip Flexion 5/5    Right/Left Knee Right;Left    Right Knee Flexion 5/5    Right Knee Extension 4/5    Left Knee Flexion 5/5    Left Knee Extension 4/5      Palpation   Spinal mobility Held due to time constraints  today  Special Tests   Other special tests (-) crossed SLR, (-) slump      Ambulation/Gait   Gait Pattern Trendelenburg;Lateral hip instability;Wide base of support;Trunk flexed   Mild changes as listed above                       Objective measurements completed on examination: See above findings.       [facility] Adult PT Treatment/Exercise - 07/11/21 0001       Exercises   Exercises Other Exercises    Other Exercises  HEP instruction/performance c cues for techniques, handout provided.  Trial set performed of each for comprehension and symptom assessment.  HEP consisting of lumbar extension ROM, supine LTR stretch bilateral, supine bridge, sit to stand (fast up/slow down)                     PT Education - 07/11/21 1318     Education Details HEP, POC    Person(s) Educated Patient    Methods Explanation;Demonstration;Verbal cues;Handout    Comprehension Returned demonstration;Verbalized understanding              PT Short Term Goals - 07/11/21 1313       PT SHORT TERM GOAL #1   Title Patient will demonstrate independent use of home exercise program to maintain progress from in clinic treatments.    Time 3    Period Weeks    Status New    Target Date 08/01/21               PT Long Term Goals - 07/11/21 1314       PT LONG TERM GOAL #1   Title Patient will demonstrate/report pain at worst less than or equal to 2/10 to facilitate minimal limitation in daily activity secondary to pain symptoms.    Time 10    Period Weeks    Status New    Target Date 09/19/21      PT LONG TERM GOAL #2   Title Patient will demonstrate independent use of home exercise program to facilitate ability to maintain/progress functional gains from skilled physical therapy services.    Time 10    Period Weeks    Status New    Target Date 09/19/21      PT LONG TERM GOAL #3   Title Pt. will demonstrate FOTO outcome > or = 48% to indicated reduced  disability due to condition.    Time 10    Period Weeks    Status New    Target Date 09/19/21      PT LONG TERM GOAL #4   Title Pt. will demonstrate lumbar AROM extension 100% WFL to facilitate upright standing/walking posture at PLOF.    Time 10    Period Weeks    Status New    Target Date 09/19/21      PT LONG TERM GOAL #5   Title Pt. will demonstrate bilateral knee MMT 5/5 throughout, hip strength > or = 4/5 throughout to facilitate usual transfers, standing, walking at PLOF.    Time 10    Period Weeks    Status New    Target Date 09/19/21      Additional Long Term Goals   Additional Long Term Goals Yes      PT LONG TERM GOAL #6   Title Pt. will demonstrate cervical AROM improvement 15 degrees or greater for better mobility in daily activity, driving.    Time 10  Period Weeks    Status New    Target Date 09/19/21                    Plan - 07/11/21 1315     Clinical Impression Statement Patient is a 70 y.o. who comes to clinic with complaints of cervical pain, lumbar pain with mobility, strength and movement coordination deficits that impair their ability to perform usual daily and recreational functional activities without increase difficulty/symptoms at this time.  Patient to benefit from skilled PT services to address impairments and limitations to improve to previous level of function without restriction secondary to condition.    Personal Factors and Comorbidities Comorbidity 3+    Comorbidities COPD, PTSD, history bilateral hip replacement    Examination-Activity Limitations Sit;Squat;Stairs;Stand;Transfers;Locomotion Level;Dressing;Bed Mobility    Examination-Participation Restrictions Shop;Community Activity;Driving;Yard Work;Laundry;Meal Prep;Cleaning    Stability/Clinical Decision Making Evolving/Moderate complexity    Clinical Decision Making Moderate    Rehab Potential Fair   fair to good (multiple treatment locations)   PT Frequency 2x / week     PT Duration Other (comment)   10 weeks   PT Treatment/Interventions ADLs/Self Care Home Management;Cryotherapy;Electrical Stimulation;Iontophoresis 4mg /ml Dexamethasone;Moist Heat;Balance training;Traction;Therapeutic exercise;Therapeutic activities;Functional mobility training;Stair training;Gait training;DME Instruction;Ultrasound;Neuromuscular re-education;Passive range of motion;Spinal Manipulations;Joint Manipulations;Dry needling;Patient/family education;Taping;Manual techniques    PT Next Visit Plan Lumbar mobility gains, add glute med strengthening and progressive LE strengthening/endurance    PT Home Exercise Plan    Consulted and Agree with Plan of Care Patient             Patient will benefit from skilled therapeutic intervention in order to improve the following deficits and impairments:  Hypomobility, Pain, Decreased strength, Decreased activity tolerance, Decreased balance, Decreased mobility, Difficulty walking, Decreased range of motion, Impaired perceived functional ability, Improper body mechanics, Postural dysfunction, Impaired flexibility, Decreased coordination  Visit Diagnosis: Chronic bilateral low back pain without sciatica  Cervicalgia  Muscle weakness (generalized)  Difficulty walking  Abnormal posture     Problem List Patient Active Problem List   Diagnosis Date Noted   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) 11/30/2020   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 [place], [place], [place] Phone: [phone]   Fax:  [phone]  Name: Judy [name] MRN: [name] Date of Birth: May 03, 1951

## 2021-07-11 NOTE — Patient Instructions (Signed)
Access Code: [email or web address] URL: [email or web address] Date: 07/11/2021 Prepared by: Chyrel Masson  Exercises Supine Bridge - 2 x daily - 7 x weekly - 3 sets - 10 reps - 2 hold Supine Lower Trunk Rotation - 2 x daily - 7 x weekly - 1 sets - 5 reps - 15 hold Standing Lumbar Extension - 4 x daily - 7 x weekly - 1 sets - 5 reps Sit to Stand - 1 x daily - 7 x weekly - 3 sets - 10 reps

## 2021-07-16 ENCOUNTER — Other Ambulatory Visit: Payer: Self-pay

## 2021-07-16 ENCOUNTER — Ambulatory Visit (INDEPENDENT_AMBULATORY_CARE_PROVIDER_SITE_OTHER): Payer: Medicare Other | Admitting: Physical Therapy

## 2021-07-16 DIAGNOSIS — M542 Cervicalgia: Secondary | ICD-10-CM

## 2021-07-16 DIAGNOSIS — M5441 Lumbago with sciatica, right side: Secondary | ICD-10-CM

## 2021-07-16 DIAGNOSIS — R293 Abnormal posture: Secondary | ICD-10-CM

## 2021-07-16 DIAGNOSIS — R262 Difficulty in walking, not elsewhere classified: Secondary | ICD-10-CM | POA: Diagnosis not present

## 2021-07-16 DIAGNOSIS — M545 Low back pain, unspecified: Secondary | ICD-10-CM

## 2021-07-16 DIAGNOSIS — M6281 Muscle weakness (generalized): Secondary | ICD-10-CM

## 2021-07-16 DIAGNOSIS — G8929 Other chronic pain: Secondary | ICD-10-CM

## 2021-07-16 NOTE — Therapy (Signed)
[facility] [address] [place], [place], [address] Phone: [phone]   Fax:  [phone]  Physical Therapy Treatment  Patient Details  Name: Judy [name] MRN: [id number] Date of Birth: Jun 09, 1951 Referring Provider (PT): [name], MD   Encounter Date: 07/16/2021   PT End of Session - 07/16/21 1414     Visit Number 2    Number of Visits 20    Date for PT Re-Evaluation 09/19/21    Authorization Type Medicare, [employer] of [place], [place] at 15    Progress Note Due on Visit 10    PT Start Time 1356   arrives late to 1345 apt   PT Stop Time 1434    PT Time Calculation (min) 38 min    Activity Tolerance Patient limited by pain    Behavior During Therapy [facility] for tasks assessed/performed             Past Medical History:  Diagnosis Date   Anxiety    Arthritis    Avascular necrosis (HCC)    Left hip   Avascular necrosis of bone of hip, 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   Bipolar 1 disorder (HCC)    COPD (chronic obstructive pulmonary disease) (HCC)    Pneumonia    PTSD (post-traumatic stress disorder)     Past Surgical History:  Procedure Laterality Date   bone spurs foot Right    COLONOSCOPY     HAND SURGERY Left    JOINT REPLACEMENT     TONSILLECTOMY     TONSILLECTOMY AND ADENOIDECTOMY     TOTAL HIP ARTHROPLASTY Left 03/19/2019   Procedure: LEFT TOTAL HIP ARTHROPLASTY ANTERIOR APPROACH;  Surgeon: [name], MD;  Location: [facility];  Service: Orthopedics;  Laterality: Left;   TOTAL HIP ARTHROPLASTY Right 06/04/2019   Procedure: RIGHT TOTAL HIP ARTHROPLASTY ANTERIOR APPROACH;  Surgeon: [name], MD;  Location: [facility];  Service: Orthopedics;  Laterality: Right;    There were no vitals filed for this visit.   Subjective Assessment - 07/16/21 1409     Subjective relays she had wedding this weekend so lots of standing and sitting and today she  arrives with 9/10 pain which is hard for her as she has a high pain tolerance.    Pertinent History COPD, PTSD, history bilateral hip replacement (about 2 years ago per Pt.)    Limitations Sitting;Lifting;Standing;Walking;House hold activities    How long can you sit comfortably? 1 hr    How long can you walk comfortably? 5 mins    Patient Stated Goals Reduce pain, get stronger    Pain Onset More than a month ago    Pain Onset More than a month ago                               [facility] Adult PT Treatment/Exercise - 07/16/21 0001       Exercises   Exercises Lumbar      Lumbar Exercises: Stretches   Single Knee to Chest Stretch Right;1 rep;2 reps;20 seconds    Lower Trunk Rotation 5 reps;10 seconds    Piriformis Stretch Right;Left;2 reps;20 seconds      Lumbar Exercises: Aerobic   Nustep L5 X5 min UE/LE      Lumbar Exercises: Supine   Clam 20 reps    Clam Limitations red    Bridge 15 reps;5 seconds  Modalities   Modalities Electrical Stimulation;Moist Heat      Moist Heat Therapy   Number Minutes Moist Heat 15 Minutes    Moist Heat Location Lumbar Spine      Electrical Stimulation   Electrical Stimulation Location lumbar-thoracic    Electrical Stimulation Action IFC for 15 min    Electrical Stimulation Parameters to tolerance level, in supine on heat pad with legs elevated, tried in sitting but too uncomfortable for her.    Electrical Stimulation Goals Pain                       PT Short Term Goals - 07/11/21 1313       PT SHORT TERM GOAL #1   Title Patient will demonstrate independent use of home exercise program to maintain progress from in clinic treatments.    Time 3    Period Weeks    Status New    Target Date 08/01/21               PT Long Term Goals - 07/11/21 1314       PT LONG TERM GOAL #1   Title Patient will demonstrate/report pain at worst less than or equal to 2/10 to facilitate minimal limitation in daily  activity secondary to pain symptoms.    Time 10    Period Weeks    Status New    Target Date 09/19/21      PT LONG TERM GOAL #2   Title Patient will demonstrate independent use of home exercise program to facilitate ability to maintain/progress functional gains from skilled physical therapy services.    Time 10    Period Weeks    Status New    Target Date 09/19/21      PT LONG TERM GOAL #3   Title Pt. will demonstrate FOTO outcome > or = 48% to indicated reduced disability due to condition.    Time 10    Period Weeks    Status New    Target Date 09/19/21      PT LONG TERM GOAL #4   Title Pt. will demonstrate lumbar AROM extension 100% WFL to facilitate upright standing/walking posture at PLOF.    Time 10    Period Weeks    Status New    Target Date 09/19/21      PT LONG TERM GOAL #5   Title Pt. will demonstrate bilateral knee MMT 5/5 throughout, hip strength > or = 4/5 throughout to facilitate usual transfers, standing, walking at PLOF.    Time 10    Period Weeks    Status New    Target Date 09/19/21      Additional Long Term Goals   Additional Long Term Goals Yes      PT LONG TERM GOAL #6   Title Pt. will demonstrate cervical AROM improvement 15 degrees or greater for better mobility in daily activity, driving.    Time 10    Period Weeks    Status New    Target Date 09/19/21                   Plan - 07/16/21 1415     Clinical Impression Statement She had high pain levels upon arrival so trialed MHP with TENS to reduce pain and improve activity tolerance. We then worked to improve lumbar/hip mobility and flexability to her tolerance. Continue POC.    Personal Factors and Comorbidities Comorbidity 3+    Comorbidities COPD,  PTSD, history bilateral hip replacement    Examination-Activity Limitations Sit;Squat;Stairs;Stand;Transfers;Locomotion Level;Dressing;Bed Mobility    Examination-Participation Restrictions Shop;Community Activity;Driving;Yard  Work;Laundry;Meal Prep;Cleaning    Stability/Clinical Decision Making Evolving/Moderate complexity    Rehab Potential Fair   fair to good (multiple treatment locations)   PT Frequency 2x / week    PT Duration Other (comment)   10 weeks   PT Treatment/Interventions ADLs/Self Care Home Management;Cryotherapy;Electrical Stimulation;Iontophoresis 4mg /ml Dexamethasone;Moist Heat;Balance training;Traction;Therapeutic exercise;Therapeutic activities;Functional mobility training;Stair training;Gait training;DME Instruction;Ultrasound;Neuromuscular re-education;Passive range of motion;Spinal Manipulations;Joint Manipulations;Dry needling;Patient/family education;Taping;Manual techniques    PT Next Visit Plan how was TENS? continue with Lumbar mobility gains, add glute med strengthening and progressive LE strengthening/endurance    PT Home Exercise Plan    Consulted and Agree with Plan of Care Patient             Patient will benefit from skilled therapeutic intervention in order to improve the following deficits and impairments:  Hypomobility, Pain, Decreased strength, Decreased activity tolerance, Decreased balance, Decreased mobility, Difficulty walking, Decreased range of motion, Impaired perceived functional ability, Improper body mechanics, Postural dysfunction, Impaired flexibility, Decreased coordination  Visit Diagnosis: Chronic bilateral low back pain without sciatica  Cervicalgia  Muscle weakness (generalized)  Difficulty walking  Abnormal posture  Chronic bilateral low back pain with right-sided sciatica  Chronic left-sided low back pain with right-sided sciatica     Problem List Patient Active Problem List   Diagnosis Date Noted   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) 11/30/2020   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

## 2021-07-18 ENCOUNTER — Encounter: Payer: Self-pay | Admitting: Rehabilitative and Restorative Service Providers"

## 2021-07-18 ENCOUNTER — Other Ambulatory Visit: Payer: Self-pay

## 2021-07-18 ENCOUNTER — Ambulatory Visit (INDEPENDENT_AMBULATORY_CARE_PROVIDER_SITE_OTHER): Payer: Medicare Other | Admitting: Rehabilitative and Restorative Service Providers"

## 2021-07-18 DIAGNOSIS — M6281 Muscle weakness (generalized): Secondary | ICD-10-CM | POA: Diagnosis not present

## 2021-07-18 DIAGNOSIS — R262 Difficulty in walking, not elsewhere classified: Secondary | ICD-10-CM | POA: Diagnosis not present

## 2021-07-18 DIAGNOSIS — M545 Low back pain, unspecified: Secondary | ICD-10-CM

## 2021-07-18 DIAGNOSIS — G8929 Other chronic pain: Secondary | ICD-10-CM

## 2021-07-18 DIAGNOSIS — R293 Abnormal posture: Secondary | ICD-10-CM

## 2021-07-18 NOTE — Patient Instructions (Signed)
Access Code: [email or web address] URL: [email or web address] Date: 07/18/2021 Prepared by: Pauletta Browns  Exercises Supine Bridge - 2 x daily - 7 x weekly - 1 sets - 10 reps - 5 seconds hold Supine Lower Trunk Rotation - 2 x daily - 7 x weekly - 1 sets - 5 reps - 10 seconds hold Sit to Stand - 2 x daily - 7 x weekly - 1 sets - 5 reps Standing Lumbar Extension at Wall - Forearms - 5 x daily - 7 x weekly - 1 sets - 5 reps - 3 seconds hold Seated Scapular Retraction - 5 x daily - 7 x weekly - 1 sets - 5 reps - 5 seconds hold Supine Figure 4 Piriformis Stretch - 2 x daily - 7 x weekly - 1 sets - 5 reps - 20 seconds hold Single Knee to Chest Stretch - 2 x daily - 7 x weekly - 1 sets - 5 reps - 20 seconds hold Heel Toe Raises with Counter Support - 2 x daily - 7 x weekly - 1 sets - 10 reps - 3 seconds hold Seated Cervical Rotation AROM - 3-5 x daily - 7 x weekly - 1 sets - 10 reps - 5 seconds hold

## 2021-07-18 NOTE — Therapy (Signed)
[facility] [address] [place], [place], [address] Phone: [phone]   Fax:  [phone]  Physical Therapy Treatment  Patient Details  Name: Judy [name] MRN: [id number] Date of Birth: [birth year] Referring Provider (PT): [name], MD   Encounter Date: 07/18/2021   PT End of Session - 07/18/21 1427     Visit Number 3    Number of Visits 20    Date for PT Re-Evaluation 09/19/21    Authorization Type Medicare, [employer] of [place], [place] at 15    Progress Note Due on Visit 10    PT Start Time 1342    PT Stop Time 1427    PT Time Calculation (min) 45 min    Activity Tolerance Patient tolerated treatment well;No increased pain    Behavior During Therapy WFL for tasks assessed/performed             Past Medical History:  Diagnosis Date   Anxiety    Arthritis    Avascular necrosis (HCC)    Left hip   Avascular necrosis of bone of hip, 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   Bipolar 1 disorder (HCC)    COPD (chronic obstructive pulmonary disease) (HCC)    Pneumonia    PTSD (post-traumatic stress disorder)     Past Surgical History:  Procedure Laterality Date   bone spurs foot Right    COLONOSCOPY     HAND SURGERY Left    JOINT REPLACEMENT     TONSILLECTOMY     TONSILLECTOMY AND ADENOIDECTOMY     TOTAL HIP ARTHROPLASTY Left 03/19/2019   Procedure: LEFT TOTAL HIP ARTHROPLASTY ANTERIOR APPROACH;  Surgeon: [name], MD;  Location: [facility];  Service: Orthopedics;  Laterality: Left;   TOTAL HIP ARTHROPLASTY Right 06/04/2019   Procedure: RIGHT TOTAL HIP ARTHROPLASTY ANTERIOR APPROACH;  Surgeon: [name], MD;  Location: [facility];  Service: Orthopedics;  Laterality: Right;    There were no vitals filed for this visit.   Subjective Assessment - 07/18/21 1349     Subjective [name] reports "fair" "not as good as it could be HEP compliance."    Pertinent  History COPD, PTSD, history bilateral hip replacement (about 2 years ago per Pt.)    Limitations Sitting;Lifting;Standing;Walking;House hold activities    How long can you sit comfortably? 1 hr    How long can you stand comfortably? 20 minutes    How long can you walk comfortably? 5 mins    Patient Stated Goals Reduce pain, get stronger    Currently in Pain? Yes    Pain Score 6     Pain Location Back    Pain Orientation Lower    Pain Descriptors / Indicators Burning;Tightness    Pain Type Chronic pain    Pain Radiating Towards B anterior thigh to the mid-thigh    Pain Onset More than a month ago    Pain Frequency Constant    Aggravating Factors  Prolonged postures, particularly flexed    Pain Relieving Factors Sleeping    Effect of Pain on Daily Activities Affects sleep, endurance with all ADLs, can't trim roses in garden    Pain Score 2    Pain Location Neck    Pain Descriptors / Indicators Tightness    Pain Type Chronic pain    Pain Radiating Towards NA    Pain Onset More than a month ago    Pain Frequency Intermittent  Aggravating Factors  Limited rotation AROM    Pain Relieving Factors Movement    Effect of Pain on Daily Activities Limits her ability to check her blind spots when changing lanes                               [facility] Adult PT Treatment/Exercise - 07/18/21 0001       Exercises   Exercises Lumbar      Lumbar Exercises: Stretches   Single Knee to Chest Stretch Left;Right;4 reps;20 seconds    Lower Trunk Rotation 5 reps;10 seconds    Figure 4 Stretch 4 reps;20 seconds    Other Lumbar Stretch Exercise Trunk extension 10X 3 seconds    Other Lumbar Stretch Exercise Cervical rotation AROM (SBP 1st) 10X 5 seconds      Lumbar Exercises: Aerobic   Nustep Level 5 for 8 minutes      Lumbar Exercises: Standing   Heel Raises 10 reps;3 seconds    Heel Raises Limitations Heel to toe raises with pelvic stabilization    Scapular Retraction  Strengthening;Both;10 reps;Limitations    Scapular Retraction Limitations 5 seconds shoulder blade pinches      Lumbar Exercises: Seated   Sit to Stand 5 reps;Limitations      Lumbar Exercises: Supine   Bridge 10 reps;5 seconds                     PT Education - 07/18/21 1427     Education Details Reviewed and corrected HEP.  Added core strengthening to HEP and cervical rotation AROM.    Person(s) Educated Patient    Methods Explanation;Handout;Demonstration;Verbal cues    Comprehension Verbal cues required;Need further instruction;Returned demonstration;Verbalized understanding              PT Short Term Goals - 07/11/21 1313       PT SHORT TERM GOAL #1   Title Patient will demonstrate independent use of home exercise program to maintain progress from in clinic treatments.    Time 3    Period Weeks    Status New    Target Date 08/01/21               PT Long Term Goals - 07/11/21 1314       PT LONG TERM GOAL #1   Title Patient will demonstrate/report pain at worst less than or equal to 2/10 to facilitate minimal limitation in daily activity secondary to pain symptoms.    Time 10    Period Weeks    Status New    Target Date 09/19/21      PT LONG TERM GOAL #2   Title Patient will demonstrate independent use of home exercise program to facilitate ability to maintain/progress functional gains from skilled physical therapy services.    Time 10    Period Weeks    Status New    Target Date 09/19/21      PT LONG TERM GOAL #3   Title Pt. will demonstrate FOTO outcome > or = 48% to indicated reduced disability due to condition.    Time 10    Period Weeks    Status New    Target Date 09/19/21      PT LONG TERM GOAL #4   Title Pt. will demonstrate lumbar AROM extension 100% WFL to facilitate upright standing/walking posture at PLOF.    Time 10    Period Weeks  Status New    Target Date 09/19/21      PT LONG TERM GOAL #5   Title Pt. will  demonstrate bilateral knee MMT 5/5 throughout, hip strength > or = 4/5 throughout to facilitate usual transfers, standing, walking at PLOF.    Time 10    Period Weeks    Status New    Target Date 09/19/21      Additional Long Term Goals   Additional Long Term Goals Yes      PT LONG TERM GOAL #6   Title Pt. will demonstrate cervical AROM improvement 15 degrees or greater for better mobility in daily activity, driving.    Time 10    Period Weeks    Status New    Target Date 09/19/21                   Plan - 07/18/21 1428     Clinical Impression Statement [name] reports inconsistent HEP compliance.  She did a good job with her standing exercises but needed corrective feedback with supine stretching.  Added additional core strengthening and cervical AROM to her HEP.  [name] will benefit from continued postural correction, AROM and strength (back and legs) work to meet LTGs.    Personal Factors and Comorbidities Comorbidity 3+    Comorbidities COPD, PTSD, history bilateral hip replacement    Examination-Activity Limitations Sit;Squat;Stairs;Stand;Transfers;Locomotion Level;Dressing;Bed Mobility    Examination-Participation Restrictions Shop;Community Activity;Driving;Yard Work;Laundry;Meal Prep;Cleaning    Stability/Clinical Decision Making Evolving/Moderate complexity    Rehab Potential Fair   fair to good (multiple treatment locations)   PT Frequency 2x / week    PT Duration Other (comment)   10 weeks   PT Treatment/Interventions ADLs/Self Care Home Management;Cryotherapy;Electrical Stimulation;Iontophoresis 4mg /ml Dexamethasone;Moist Heat;Balance training;Traction;Therapeutic exercise;Therapeutic activities;Functional mobility training;Stair training;Gait training;DME Instruction;Ultrasound;Neuromuscular re-education;Passive range of motion;Spinal Manipulations;Joint Manipulations;Dry needling;Patient/family education;Taping;Manual techniques    PT Next Visit Plan Progress to hip  abductors strength, continued postural/back/LE strengthening and practical body mechanics work    PT Home Exercise Plan    Consulted and Agree with Plan of Care Patient             Patient will benefit from skilled therapeutic intervention in order to improve the following deficits and impairments:  Hypomobility, Pain, Decreased strength, Decreased activity tolerance, Decreased balance, Decreased mobility, Difficulty walking, Decreased range of motion, Impaired perceived functional ability, Improper body mechanics, Postural dysfunction, Impaired flexibility, Decreased coordination  Visit Diagnosis: Chronic bilateral low back pain without sciatica  Difficulty walking  Muscle weakness (generalized)  Abnormal posture     Problem List Patient Active Problem List   Diagnosis Date Noted   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) 11/30/2020   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

## 2021-07-20 ENCOUNTER — Other Ambulatory Visit: Payer: Self-pay | Admitting: Family Medicine

## 2021-07-23 ENCOUNTER — Ambulatory Visit (INDEPENDENT_AMBULATORY_CARE_PROVIDER_SITE_OTHER): Payer: Medicare Other | Admitting: Physical Therapy

## 2021-07-23 ENCOUNTER — Other Ambulatory Visit: Payer: Self-pay

## 2021-07-23 DIAGNOSIS — M545 Low back pain, unspecified: Secondary | ICD-10-CM

## 2021-07-23 DIAGNOSIS — M5441 Lumbago with sciatica, right side: Secondary | ICD-10-CM

## 2021-07-23 DIAGNOSIS — M6281 Muscle weakness (generalized): Secondary | ICD-10-CM

## 2021-07-23 DIAGNOSIS — G8929 Other chronic pain: Secondary | ICD-10-CM

## 2021-07-23 DIAGNOSIS — R262 Difficulty in walking, not elsewhere classified: Secondary | ICD-10-CM | POA: Diagnosis not present

## 2021-07-23 DIAGNOSIS — R293 Abnormal posture: Secondary | ICD-10-CM | POA: Diagnosis not present

## 2021-07-23 DIAGNOSIS — M542 Cervicalgia: Secondary | ICD-10-CM

## 2021-07-23 NOTE — Therapy (Signed)
[facility] [address] [place], [place], [address] Phone: [phone]   Fax:  [phone]  Physical Therapy Treatment  Patient Details  Name: Judy [name] MRN: [id number] Date of Birth: 02-29-1951 Referring Provider (PT): [name], MD   Encounter Date: 07/23/2021   PT End of Session - 07/23/21 1420     Visit Number 4    Number of Visits 20    Date for PT Re-Evaluation 09/19/21    Authorization Type Medicare, [employer] of [place], [place] at 15    Progress Note Due on Visit 10    PT Start Time 1349    PT Stop Time 1427    PT Time Calculation (min) 38 min    Activity Tolerance Patient tolerated treatment well;Patient limited by pain    Behavior During Therapy [facility] for tasks assessed/performed             Past Medical History:  Diagnosis Date   Anxiety    Arthritis    Avascular necrosis (HCC)    Left hip   Avascular necrosis of bone of hip, 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   Bipolar 1 disorder (HCC)    COPD (chronic obstructive pulmonary disease) (HCC)    Pneumonia    PTSD (post-traumatic stress disorder)     Past Surgical History:  Procedure Laterality Date   bone spurs foot Right    COLONOSCOPY     HAND SURGERY Left    JOINT REPLACEMENT     TONSILLECTOMY     TONSILLECTOMY AND ADENOIDECTOMY     TOTAL HIP ARTHROPLASTY Left 03/19/2019   Procedure: LEFT TOTAL HIP ARTHROPLASTY ANTERIOR APPROACH;  Surgeon: [name], MD;  Location: [facility];  Service: Orthopedics;  Laterality: Left;   TOTAL HIP ARTHROPLASTY Right 06/04/2019   Procedure: RIGHT TOTAL HIP ARTHROPLASTY ANTERIOR APPROACH;  Surgeon: [name], MD;  Location: [facility];  Service: Orthopedics;  Laterality: Right;    There were no vitals filed for this visit.   Subjective Assessment - 07/23/21 1408     Subjective relays she is feeling "terrible" today, 8-9 out of 10 pain in her back and  knees and neck    Pertinent History COPD, PTSD, history bilateral hip replacement (about 2 years ago per Pt.)    Limitations Sitting;Lifting;Standing;Walking;House hold activities    How long can you sit comfortably? 1 hr    How long can you stand comfortably? 20 minutes    How long can you walk comfortably? 5 mins    Patient Stated Goals Reduce pain, get stronger    Pain Onset More than a month ago    Pain Onset More than a month ago             [facility] Adult PT Treatment/Exercise - 07/23/21 0001       Lumbar Exercises: Stretches   Lower Trunk Rotation 5 reps;10 seconds    Figure 4 Stretch 4 reps;20 seconds    Other Lumbar Stretch Exercise Trunk extension 10X 3 seconds      Lumbar Exercises: Aerobic   UBE (Upper Arm Bike) leg portion 5 min L2      Lumbar Exercises: Standing   Row 15 reps    Theraband Level (Row) Level 2 (Red)    Shoulder Extension 15 reps    Theraband Level (Shoulder Extension) Level 2 (Red)    Other Standing Lumbar Exercises hip extensions and hip abd X10 bilat  Lumbar Exercises: Supine   Pelvic Tilt 15 reps    Clam 20 reps    Clam Limitations red    Bent Knee Raise 15 reps    Bent Knee Raise Limitations red band around knees, marches    Bridge 15 reps;5 seconds    Other Supine Lumbar Exercises cervical rotation 10 reps X5 sec, cervical sidebend 10 reps X5 sec, chin tucks 10 reps X5 sec                       PT Short Term Goals - 07/11/21 1313       PT SHORT TERM GOAL #1   Title Patient will demonstrate independent use of home exercise program to maintain progress from in clinic treatments.    Time 3    Period Weeks    Status New    Target Date 08/01/21               PT Long Term Goals - 07/11/21 1314       PT LONG TERM GOAL #1   Title Patient will demonstrate/report pain at worst less than or equal to 2/10 to facilitate minimal limitation in daily activity secondary to pain symptoms.    Time 10    Period Weeks     Status New    Target Date 09/19/21      PT LONG TERM GOAL #2   Title Patient will demonstrate independent use of home exercise program to facilitate ability to maintain/progress functional gains from skilled physical therapy services.    Time 10    Period Weeks    Status New    Target Date 09/19/21      PT LONG TERM GOAL #3   Title Pt. will demonstrate FOTO outcome > or = 48% to indicated reduced disability due to condition.    Time 10    Period Weeks    Status New    Target Date 09/19/21      PT LONG TERM GOAL #4   Title Pt. will demonstrate lumbar AROM extension 100% WFL to facilitate upright standing/walking posture at PLOF.    Time 10    Period Weeks    Status New    Target Date 09/19/21      PT LONG TERM GOAL #5   Title Pt. will demonstrate bilateral knee MMT 5/5 throughout, hip strength > or = 4/5 throughout to facilitate usual transfers, standing, walking at PLOF.    Time 10    Period Weeks    Status New    Target Date 09/19/21      Additional Long Term Goals   Additional Long Term Goals Yes      PT LONG TERM GOAL #6   Title Pt. will demonstrate cervical AROM improvement 15 degrees or greater for better mobility in daily activity, driving.    Time 10    Period Weeks    Status New    Target Date 09/19/21                   Plan - 07/23/21 1428     Clinical Impression Statement She had high pain levels today and limited activity tolerance but did show good effort with PT. We will continue to work to improve her strength, ROM and posture as pain allows.    Personal Factors and Comorbidities Comorbidity 3+    Comorbidities COPD, PTSD, history bilateral hip replacement    Examination-Activity Limitations Sit;Squat;Stairs;Stand;Transfers;Locomotion Level;Dressing;Bed Mobility  Examination-Participation Restrictions Shop;Community Activity;Driving;Yard Work;Laundry;Meal Prep;Cleaning    Stability/Clinical Decision Making Evolving/Moderate complexity     Rehab Potential Fair   fair to good (multiple treatment locations)   PT Frequency 2x / week    PT Duration Other (comment)   10 weeks   PT Treatment/Interventions ADLs/Self Care Home Management;Cryotherapy;Electrical Stimulation;Iontophoresis 4mg /ml Dexamethasone;Moist Heat;Balance training;Traction;Therapeutic exercise;Therapeutic activities;Functional mobility training;Stair training;Gait training;DME Instruction;Ultrasound;Neuromuscular re-education;Passive range of motion;Spinal Manipulations;Joint Manipulations;Dry needling;Patient/family education;Taping;Manual techniques    PT Next Visit Plan Progress to hip abductors strength, continued postural/back/LE strengthening and practical body mechanics work    PT Home Exercise Plan    Consulted and Agree with Plan of Care Patient             Patient will benefit from skilled therapeutic intervention in order to improve the following deficits and impairments:  Hypomobility, Pain, Decreased strength, Decreased activity tolerance, Decreased balance, Decreased mobility, Difficulty walking, Decreased range of motion, Impaired perceived functional ability, Improper body mechanics, Postural dysfunction, Impaired flexibility, Decreased coordination  Visit Diagnosis: Chronic bilateral low back pain without sciatica  Difficulty walking  Muscle weakness (generalized)  Abnormal posture  Cervicalgia  Chronic bilateral low back pain with right-sided sciatica     Problem List Patient Active Problem List   Diagnosis Date Noted   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 [place], [place], [place] Phone: [phone]   Fax:  [phone]  Name: Judy [name] MRN: [name] Date of Birth: September 12, 1951

## 2021-07-25 ENCOUNTER — Other Ambulatory Visit: Payer: Self-pay

## 2021-07-25 ENCOUNTER — Ambulatory Visit (INDEPENDENT_AMBULATORY_CARE_PROVIDER_SITE_OTHER): Payer: Medicare Other | Admitting: Physical Therapy

## 2021-07-25 DIAGNOSIS — M542 Cervicalgia: Secondary | ICD-10-CM

## 2021-07-25 DIAGNOSIS — M6281 Muscle weakness (generalized): Secondary | ICD-10-CM | POA: Diagnosis not present

## 2021-07-25 DIAGNOSIS — R293 Abnormal posture: Secondary | ICD-10-CM

## 2021-07-25 DIAGNOSIS — R262 Difficulty in walking, not elsewhere classified: Secondary | ICD-10-CM | POA: Diagnosis not present

## 2021-07-25 DIAGNOSIS — G8929 Other chronic pain: Secondary | ICD-10-CM

## 2021-07-25 DIAGNOSIS — M5441 Lumbago with sciatica, right side: Secondary | ICD-10-CM

## 2021-07-25 NOTE — Therapy (Signed)
[facility] Physical Therapy [address] [place], [place], [address] Phone: [phone]   Fax:  [phone]  Physical Therapy Treatment  Patient Details  Name: Judy [name] MRN: [id number] Date of Birth: 05/14/1951 Referring Provider (PT): [name], MD   Encounter Date: 07/25/2021   PT End of Session - 07/25/21 1428     Visit Number 5    Number of Visits 20    Date for PT Re-Evaluation 09/19/21    Authorization Type Medicare, mutual of [place], [place] at 15    Progress Note Due on Visit 10    PT Start Time 1357   arrives late to 1345 appointment   PT Stop Time 1440    PT Time Calculation (min) 43 min    Activity Tolerance Patient limited by pain    Behavior During Therapy [facility] for tasks assessed/performed             Past Medical History:  Diagnosis Date   Anxiety    Arthritis    Avascular necrosis (HCC)    Left hip   Avascular necrosis of bone of hip, 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   Bipolar 1 disorder (HCC)    COPD (chronic obstructive pulmonary disease) (HCC)    Pneumonia    PTSD (post-traumatic stress disorder)     Past Surgical History:  Procedure Laterality Date   bone spurs foot Right    COLONOSCOPY     HAND SURGERY Left    JOINT REPLACEMENT     TONSILLECTOMY     TONSILLECTOMY AND ADENOIDECTOMY     TOTAL HIP ARTHROPLASTY Left 03/19/2019   Procedure: LEFT TOTAL HIP ARTHROPLASTY ANTERIOR APPROACH;  Surgeon: [name], MD;  Location: [facility];  Service: Orthopedics;  Laterality: Left;   TOTAL HIP ARTHROPLASTY Right 06/04/2019   Procedure: RIGHT TOTAL HIP ARTHROPLASTY ANTERIOR APPROACH;  Surgeon: [name], MD;  Location: [facility];  Service: Orthopedics;  Laterality: Right;    There were no vitals filed for this visit.   Subjective Assessment - 07/25/21 1404     Subjective relays she is "not good" states she continues to have 9/10 pain. Any  activity bothers her, the only thing that feels better is laying on her side.    Pertinent History COPD, PTSD, history bilateral hip replacement (about 2 years ago per Pt.)    Limitations Sitting;Lifting;Standing;Walking;House hold activities    How long can you sit comfortably? 1 hr    How long can you stand comfortably? 20 minutes    How long can you walk comfortably? 5 mins    Patient Stated Goals Reduce pain, get stronger    Pain Onset More than a month ago    Pain Onset More than a month ago              [facility] Adult PT Treatment/Exercise - 07/25/21 0001       Lumbar Exercises: Aerobic   Nustep L5 UE/LE X 5 min      Lumbar Exercises: Machines for Strengthening   Leg Press DL 33# L45, then SL 62# B63 ea side      Lumbar Exercises: Standing   Row 20 reps    Theraband Level (Row) Level 3 (Green)    Shoulder Extension 20 reps    Theraband Level (Shoulder Extension) Level 3 (Green)    Other Standing Lumbar Exercises lateral walking at counter top 3 round trips  Modalities   Modalities Traction      Traction   Type of Traction Lumbar    Min (lbs) 60    Max (lbs) 80    Time 15 min                       PT Short Term Goals - 07/11/21 1313       PT SHORT TERM GOAL #1   Title Patient will demonstrate independent use of home exercise program to maintain progress from in clinic treatments.    Time 3    Period Weeks    Status New    Target Date 08/01/21               PT Long Term Goals - 07/11/21 1314       PT LONG TERM GOAL #1   Title Patient will demonstrate/report pain at worst less than or equal to 2/10 to facilitate minimal limitation in daily activity secondary to pain symptoms.    Time 10    Period Weeks    Status New    Target Date 09/19/21      PT LONG TERM GOAL #2   Title Patient will demonstrate independent use of home exercise program to facilitate ability to maintain/progress functional gains from skilled physical therapy  services.    Time 10    Period Weeks    Status New    Target Date 09/19/21      PT LONG TERM GOAL #3   Title Pt. will demonstrate FOTO outcome > or = 48% to indicated reduced disability due to condition.    Time 10    Period Weeks    Status New    Target Date 09/19/21      PT LONG TERM GOAL #4   Title Pt. will demonstrate lumbar AROM extension 100% WFL to facilitate upright standing/walking posture at PLOF.    Time 10    Period Weeks    Status New    Target Date 09/19/21      PT LONG TERM GOAL #5   Title Pt. will demonstrate bilateral knee MMT 5/5 throughout, hip strength > or = 4/5 throughout to facilitate usual transfers, standing, walking at PLOF.    Time 10    Period Weeks    Status New    Target Date 09/19/21      Additional Long Term Goals   Additional Long Term Goals Yes      PT LONG TERM GOAL #6   Title Pt. will demonstrate cervical AROM improvement 15 degrees or greater for better mobility in daily activity, driving.    Time 10    Period Weeks    Status New    Target Date 09/19/21                   Plan - 07/25/21 1430     Clinical Impression Statement She continues to have high pain levels. Instead of focusing on stretches moved more into strengthening to improve overall functional abilities even though her pain is not improving. We did trial mechanical traction today to see if this will relieve any of her back pain.    Personal Factors and Comorbidities Comorbidity 3+    Comorbidities COPD, PTSD, history bilateral hip replacement    Examination-Activity Limitations Sit;Squat;Stairs;Stand;Transfers;Locomotion Level;Dressing;Bed Mobility    Examination-Participation Restrictions Shop;Community Activity;Driving;Yard Work;Laundry;Meal Prep;Cleaning    Stability/Clinical Decision Making Evolving/Moderate complexity    Rehab Potential Fair   fair  to good (multiple treatment locations)   PT Frequency 2x / week    PT Duration Other (comment)   10 weeks    PT Treatment/Interventions ADLs/Self Care Home Management;Cryotherapy;Electrical Stimulation;Iontophoresis 4mg /ml Dexamethasone;Moist Heat;Balance training;Traction;Therapeutic exercise;Therapeutic activities;Functional mobility training;Stair training;Gait training;DME Instruction;Ultrasound;Neuromuscular re-education;Passive range of motion;Spinal Manipulations;Joint Manipulations;Dry needling;Patient/family education;Taping;Manual techniques    PT Next Visit Plan how was mechanical traction? work on hip abductors strength, continued postural/back/LE strengthening and practical body mechanics work    PT Home Exercise Plan    Consulted and Agree with Plan of Care Patient             Patient will benefit from skilled therapeutic intervention in order to improve the following deficits and impairments:  Hypomobility, Pain, Decreased strength, Decreased activity tolerance, Decreased balance, Decreased mobility, Difficulty walking, Decreased range of motion, Impaired perceived functional ability, Improper body mechanics, Postural dysfunction, Impaired flexibility, Decreased coordination  Visit Diagnosis: Difficulty walking  Muscle weakness (generalized)  Abnormal posture  Cervicalgia  Chronic bilateral low back pain with right-sided sciatica  Chronic left-sided low back pain with right-sided sciatica     Problem List Patient Active Problem List   Diagnosis Date Noted   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) 11/30/2020   Status post total replacement of right hip 06/04/2019   Status post total replacement of left hip 03/19/2019   PTSD (post-traumatic stress disorder)    COPD (chronic obstructive pulmonary disease) (HCC)    Bipolar 1 disorder (HCC)    Anxiety    B12 deficiency 12/09/2018   Hyperglycemia 04/14/2017    06/15/2017, PT,DPT 07/25/2021, 2:32 PM  [facility] Physical Therapy [address] [place], [place],  [place] Phone: [phone]   Fax:  [phone]  Name: DINARA LUPU MRN: [name] Date of Birth: Mar 24, 1951

## 2021-07-30 ENCOUNTER — Ambulatory Visit (INDEPENDENT_AMBULATORY_CARE_PROVIDER_SITE_OTHER): Payer: Medicare Other | Admitting: Physical Therapy

## 2021-07-30 ENCOUNTER — Other Ambulatory Visit: Payer: Self-pay

## 2021-07-30 DIAGNOSIS — M5441 Lumbago with sciatica, right side: Secondary | ICD-10-CM

## 2021-07-30 DIAGNOSIS — R262 Difficulty in walking, not elsewhere classified: Secondary | ICD-10-CM | POA: Diagnosis not present

## 2021-07-30 DIAGNOSIS — M6281 Muscle weakness (generalized): Secondary | ICD-10-CM

## 2021-07-30 DIAGNOSIS — M542 Cervicalgia: Secondary | ICD-10-CM

## 2021-07-30 DIAGNOSIS — R293 Abnormal posture: Secondary | ICD-10-CM

## 2021-07-30 DIAGNOSIS — G8929 Other chronic pain: Secondary | ICD-10-CM

## 2021-07-30 NOTE — Therapy (Addendum)
[facility] Physical Therapy [address] [place], [place], [address] Phone: [phone]   Fax:  [phone]  Physical Therapy Treatment / Discharge   Patient Details  Name: Judy [name] MRN: [id number] Date of Birth: 05-Dec-1950 Referring Provider (PT): Mcarthur Rossetti, MD   Encounter Date: 07/30/2021   PT End of Session - 07/30/21 1426     Visit Number 6    Number of Visits 20    Date for PT Re-Evaluation 09/19/21    Authorization Type Medicare, [employer] of [place], [place] at 59    Progress Note Due on Visit 10    PT Start Time 1355    PT Stop Time 1435    PT Time Calculation (min) 40 min    Activity Tolerance Patient limited by pain    Behavior During Therapy [facility] for tasks assessed/performed             Past Medical History:  Diagnosis Date   Anxiety    Arthritis    Avascular necrosis ([place])    Left hip   Avascular necrosis of bone of hip, left ([place]) 12/28/2018   Avascular necrosis of bone of right hip ([place]) 04/28/2019   Bipolar 1 disorder ([place])    COPD (chronic obstructive pulmonary disease) ([place])    Pneumonia    PTSD (post-traumatic stress disorder)     Past Surgical History:  Procedure Laterality Date   bone spurs foot Right    COLONOSCOPY     HAND SURGERY Left    JOINT REPLACEMENT     TONSILLECTOMY     TONSILLECTOMY AND ADENOIDECTOMY     TOTAL HIP ARTHROPLASTY Left 03/19/2019   Procedure: LEFT TOTAL HIP ARTHROPLASTY ANTERIOR APPROACH;  Surgeon: Mcarthur Rossetti, MD;  Location: [facility];  Service: Orthopedics;  Laterality: Left;   TOTAL HIP ARTHROPLASTY Right 06/04/2019   Procedure: RIGHT TOTAL HIP ARTHROPLASTY ANTERIOR APPROACH;  Surgeon: Mcarthur Rossetti, MD;  Location: [facility];  Service: Orthopedics;  Laterality: Right;    There were no vitals filed for this visit.   Subjective Assessment - 07/30/21 1423     Subjective She continued to have 9/10 pain in her Lt knee and Rt low back/hip. She did not get any relief  from mechanical traction last visit.    Pertinent History COPD, PTSD, history bilateral hip replacement (about 2 years ago per Pt.)    Limitations Sitting;Lifting;Standing;Walking;House hold activities    How long can you sit comfortably? 1 hr    How long can you stand comfortably? 20 minutes    How long can you walk comfortably? 5 mins    Patient Stated Goals Reduce pain, get stronger    Pain Onset More than a month ago    Pain Onset More than a month ago                               [facility] Adult PT Treatment/Exercise - 07/30/21 0001       Modalities   Modalities Ultrasound      Moist Heat Therapy   Number Minutes Moist Heat 10 Minutes    Moist Heat Location Lumbar Spine   in supine during TENS to her knee     Electrical Stimulation   Electrical Stimulation Location Lt knee    Electrical Stimulation Action IFC    Electrical Stimulation Parameters X15 min to level 50    Electrical Stimulation Goals Pain  Ultrasound   Ultrasound Location Rt lumbar/hip area in sidelying    Ultrasound Parameters 10 min, 1.5 W/cm2, 100% 1.0 MHZ    Ultrasound Goals Pain                       PT Short Term Goals - 07/11/21 1313       PT SHORT TERM GOAL #1   Title Patient will demonstrate independent use of home exercise program to maintain progress from in clinic treatments.    Time 3    Period Weeks    Status New    Target Date 08/01/21               PT Long Term Goals - 07/30/21 1432       PT LONG TERM GOAL #1   Title Patient will demonstrate/report pain at worst less than or equal to 2/10 to facilitate minimal limitation in daily activity secondary to pain symptoms.    Baseline 9/10 now    Time 10    Period Weeks    Status Not Met      PT LONG TERM GOAL #2   Title Patient will demonstrate independent use of home exercise program to facilitate ability to maintain/progress functional gains from skilled physical therapy services.     Baseline unable to perform due to pain    Time 10    Period Weeks    Status Not Met      PT LONG TERM GOAL #3   Title Pt. will demonstrate FOTO outcome > or = 48% to indicated reduced disability due to condition.    Time 10    Period Weeks    Status Not Met      PT LONG TERM GOAL #4   Title Pt. will demonstrate lumbar AROM extension 100% WFL to facilitate upright standing/walking posture at PLOF.    Time 10    Period Weeks    Status Not Met      PT LONG TERM GOAL #5   Title Pt. will demonstrate bilateral knee MMT 5/5 throughout, hip strength > or = 4/5 throughout to facilitate usual transfers, standing, walking at PLOF.    Time 10    Period Weeks    Status Not Met      PT LONG TERM GOAL #6   Title Pt. will demonstrate cervical AROM improvement 15 degrees or greater for better mobility in daily activity, driving.    Time 10    Period Weeks    Status Not Met                   Plan - 07/30/21 1429     Clinical Impression Statement She continues to have 9/10 pain and PT has not helped with her symptoms, she has not responsed favorably to stretches, strengthening exercises, traction, TENS,or therapuetic ultrasound. She has very limited actiity tolerance overall and new severe pain in her knee. PT has exhaused pain relief efforts and recommend she return to MD for further evaluation and direction of care.    Personal Factors and Comorbidities Comorbidity 3+    Comorbidities COPD, PTSD, history bilateral hip replacement    Examination-Activity Limitations Sit;Squat;Stairs;Stand;Transfers;Locomotion Level;Dressing;Bed Mobility    Examination-Participation Restrictions Shop;Community Activity;Driving;Yard Work;Laundry;Meal Prep;Cleaning    Stability/Clinical Decision Making Evolving/Moderate complexity    Rehab Potential Fair   fair to good (multiple treatment locations)   PT Frequency 2x / week    PT Duration Other (comment)   10  weeks   PT Treatment/Interventions ADLs/Self  Care Home Management;Cryotherapy;Electrical Stimulation;Iontophoresis 9m/ml Dexamethasone;Moist Heat;Balance training;Traction;Therapeutic exercise;Therapeutic activities;Functional mobility training;Stair training;Gait training;DME Instruction;Ultrasound;Neuromuscular re-education;Passive range of motion;Spinal Manipulations;Joint Manipulations;Dry needling;Patient/family education;Taping;Manual techniques    PT Next Visit Plan hold PT and refer back to MD    PT Home Exercise Plan [id number]   Consulted and Agree with Plan of Care Patient             Patient will benefit from skilled therapeutic intervention in order to improve the following deficits and impairments:  Hypomobility, Pain, Decreased strength, Decreased activity tolerance, Decreased balance, Decreased mobility, Difficulty walking, Decreased range of motion, Impaired perceived functional ability, Improper body mechanics, Postural dysfunction, Impaired flexibility, Decreased coordination  Visit Diagnosis: Difficulty walking  Muscle weakness (generalized)  Abnormal posture  Cervicalgia  Chronic bilateral low back pain with right-sided sciatica     Problem List Patient Active Problem List   Diagnosis Date Noted   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 ([place] 11/30/2020   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 Patient goals were partially met. Patient is being  discharged due to not returning since the last visit.  MScot Jun PT, DPT, OCS, ATC 09/26/21  3:36 PM     [facility] [address] [address]Phone: [phone]  Fax:  [phone] Name: Judy PUTNAMMRN: [id number] of Birth: 401-Aug-1952

## 2021-08-01 ENCOUNTER — Encounter: Payer: Medicare Other | Admitting: Rehabilitative and Restorative Service Providers"

## 2021-08-03 ENCOUNTER — Other Ambulatory Visit: Payer: Self-pay

## 2021-08-03 ENCOUNTER — Ambulatory Visit (INDEPENDENT_AMBULATORY_CARE_PROVIDER_SITE_OTHER): Payer: Medicare Other | Admitting: Family Medicine

## 2021-08-03 ENCOUNTER — Telehealth: Payer: Self-pay | Admitting: *Deleted

## 2021-08-03 ENCOUNTER — Encounter: Payer: Self-pay | Admitting: Family Medicine

## 2021-08-03 VITALS — BP 126/70 | HR 95 | Temp 97.6°F | Ht 70.0 in | Wt 234.0 lb

## 2021-08-03 DIAGNOSIS — M25562 Pain in left knee: Secondary | ICD-10-CM

## 2021-08-03 DIAGNOSIS — M45 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: Secondary | ICD-10-CM | POA: Diagnosis not present

## 2021-08-03 DIAGNOSIS — J449 Chronic obstructive pulmonary disease, unspecified: Secondary | ICD-10-CM | POA: Diagnosis not present

## 2021-08-03 DIAGNOSIS — E785 Hyperlipidemia, unspecified: Secondary | ICD-10-CM

## 2021-08-03 DIAGNOSIS — R739 Hyperglycemia, unspecified: Secondary | ICD-10-CM

## 2021-08-03 DIAGNOSIS — E538 Deficiency of other specified B group vitamins: Secondary | ICD-10-CM | POA: Diagnosis not present

## 2021-08-03 DIAGNOSIS — F319 Bipolar disorder, unspecified: Secondary | ICD-10-CM

## 2021-08-03 DIAGNOSIS — F419 Anxiety disorder, unspecified: Secondary | ICD-10-CM

## 2021-08-03 DIAGNOSIS — R7989 Other specified abnormal findings of blood chemistry: Secondary | ICD-10-CM

## 2021-08-03 MED ORDER — SPACER/AERO-HOLDING CHAMBERS DEVI: 1.0000 | Freq: Every day | 0 refills | Status: DC | PRN

## 2021-08-03 MED ORDER — DICLOFENAC SODIUM 75 MG PO TBEC: 75.0000 mg | DELAYED_RELEASE_TABLET | Freq: Two times a day (BID) | ORAL | 0 refills | Status: DC

## 2021-08-03 NOTE — Patient Instructions (Addendum)
OK to trial the bretzri. Stop anoro. Use 2 puffs of breztri twice daily with the spacing chamber. If not improvement in 2 weeks, you can return to anoro. (Suggest six breaths through the chamber for each puff)  *for nosebleeds: you can keep afrin on hand to stop bleeds. Consider nasal saline (gel may be better) twice daily. Consider humidifier in room to help with moisturizing air.   *check with Dr. Dione Booze about resuming the rheumatology medication. From Dr. Corliss Skains: "She should hold off Cimzia for at least 2 weeks after the cataract surgery.  She may resume Cimzia when she gets clearance from the ophthalmologist."

## 2021-08-03 NOTE — Progress Notes (Signed)
Judy [name] DOB: 1951-05-26 Encounter date: 08/03/2021  This is a 70 y.o. female who presents for chronic condition visit    History of present illness: Last visit with me was 3 months ago.  Purpose of that visit was to get a letter so she could proceed with exercise plan and massage therapy.  Last physical was 10/30/2020.  Bipolar 1:vraylar 70m daily, latuda 851mqhs, seroquel 80028mhs.  Follows regularly with psychiatry.  Mood is depressed.  It is fairly stable, but chronic difficulties with breathing, weight gain, knee pain, chronic joint pain all contribute to mood being poor.   COPD: Follows with Dr. ManVaughan Brownerhe doesn't feel like she is getting better from this standpoint.  She was switched to Anoro 1 month ago and is using this regularly, but feels like breathing is not getting better, may be worse.  She does feel that she gets some improvement when she uses albuterol, but it is harder to get improvement the longer she waits to use inhaler.  She does not use a spacing chamber with her albuterol.    Hyperlipidemia: Lipitor 40 mg -she had forgotten she was supposed to be taking this medication at her last visit.  Restart suggested.  Ankylosing spondylitis: following with Dr. DevEstanislado Pandyheum.  Started on cimzia q 4 weeks.  Had to hold this medication due to cataract surgery.  She needs approval from ophthalmology before being able to restart medication.    Doing "terrible" - left knee went out - just got up and sudden pain front around inside of knee. Sees ortho on weds. Using ice on it. PT cut off due to knee. Not really seeing improvement. Left knee is swollen. Arms and hands are swollen. Had something similar before and had knee drained which helped for awhile. She is taking meloxicam from ortho - "useless".   Nose bleeds - about every 1-2 weeks. Happening for last 6 weeks. Not had issues with nose bleeds in past. Left side. Last 5 minutes. Comes out front. No nasal congestion  or recent illness.    Allergies  Allergen Reactions   Celexa [Citalopram Hydrobromide]     "goes weird"   Gabapentin     "goes weird"    Current Meds  Medication Sig   Acetaminophen (TYLENOL 8 HOUR PO) Take 500 mg by mouth.   albuterol (VENTOLIN HFA) 108 (90 Base) MCG/ACT inhaler Inhale 1-2 puffs into the lungs every 6 (six) hours as needed for wheezing or shortness of breath.   atorvastatin (LIPITOR) 40 MG tablet Take 1 tablet by mouth once daily   cariprazine (VRAYLAR) capsule Take 6 mg by mouth daily.    Certolizumab Pegol (CIMZIA PREFILLED) 2 X 200 MG/ML KIT Inject 400 mg into the skin every 30 (thirty) days.    Cyanocobalamin (VITAMIN B-12 PO) Take by mouth daily.   diclofenac (VOLTAREN) 75 MG EC tablet Take 1 tablet (75 mg total) by mouth 2 (two) times daily.   LORazepam (ATIVAN) 1 MG tablet Take 3 mg by mouth daily.   lurasidone (LATUDA) 80 MG TABS tablet Take 80 mg by mouth every evening.    naltrexone (DEPADE) 50 MG tablet Take 50 mg by mouth every morning.   QUEtiapine (SEROQUEL) 400 MG tablet Take 800 mg by mouth at bedtime.   Spacer/Aero-Holding Chambers DEVI 1 Device by Does not apply route daily as needed.   umeclidinium-vilanterol (ANORO ELLIPTA) 62.5-25 MCG/INH AEPB Inhale 1 puff into the lungs daily.   [DISCONTINUED] meloxicam (MOBIC) 15 MG  tablet Take 1 tablet (15 mg total) by mouth daily.    Review of Systems  Constitutional:  Positive for fatigue and unexpected weight change (gaining). Negative for chills and fever.  HENT:  Negative for congestion, postnasal drip and rhinorrhea.   Respiratory:  Positive for shortness of breath and wheezing. Negative for cough and chest tightness.   Cardiovascular:  Negative for chest pain, palpitations and leg swelling.  Musculoskeletal:  Positive for arthralgias, gait problem and joint swelling.  Psychiatric/Behavioral:  Negative for suicidal ideas.    Objective:  BP 126/70 (BP Location: Left Arm, Patient Position: Sitting,  Cuff Size: Large)   Pulse 95   Temp 97.6 F (36.4 C) (Oral)   Ht '5\' 10"'  (1.778 m)   Wt 234 lb (106.1 kg)   SpO2 95%   BMI 33.58 kg/m   Weight: 234 lb (106.1 kg)   BP Readings from Last 3 Encounters:  08/03/21 126/70  07/04/21 138/74  05/03/21 117/83   Wt Readings from Last 3 Encounters:  08/03/21 234 lb (106.1 kg)  07/04/21 233 lb 3.2 oz (105.8 kg)  05/03/21 230 lb (104.3 kg)    Physical Exam Constitutional:      General: She is not in acute distress.    Appearance: She is well-developed.  HENT:     Nose:     Comments: Erythematous nasal mucosa.  There is small scab left anterior nares, which appears to be a source of bleeding.  No other nasal abnormalities appreciated.  No active bleeding. Cardiovascular:     Rate and Rhythm: Normal rate and regular rhythm.     Heart sounds: Normal heart sounds. No murmur heard.   No friction rub.  Pulmonary:     Effort: Pulmonary effort is normal. No respiratory distress.     Breath sounds: Examination of the right-upper field reveals wheezing. Examination of the left-upper field reveals wheezing. Examination of the right-middle field reveals wheezing. Examination of the left-middle field reveals wheezing. Examination of the right-lower field reveals wheezing. Examination of the left-lower field reveals wheezing. Decreased breath sounds and wheezing present. No rales.     Comments: Inspiratory and expiratory wheezes bilaterally. Musculoskeletal:     Right lower leg: No edema.     Left lower leg: No edema.  Neurological:     Mental Status: She is alert and oriented to person, place, and time.  Psychiatric:        Behavior: Behavior normal.    Assessment/Plan  1. Chronic obstructive pulmonary disease, unspecified COPD type [facility]) She does not have follow-up until December with pulmonology.  I did have some breztri samples in the office today.  She has not tried this before, and I feel it is worth trying since breathing feels slightly  worse with the Anoro inhaler she is using.  I did also send in a spacing chamber for her to use with her albuterol and breztri.  If no improvement within a couple weeks of use, can return to [facility] and suggest follow-up with pulmonology. - Spacer/Aero-Holding Chambers DEVI; 1 Device by Does not apply route daily as needed.  Dispense: 1 each; Refill: 0  2. 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  She was confused about who she needed to get approval for her to restart medication, but I wrote this out for her on AVS today. - Sedimentation rate; Future  3. Hyperglycemia She was prediabetic previously, continues with weight gain.  We will check blood work.  I  wonder if hyperglycemia is contributing to her weight gain and difficulty with weight loss. - Hemoglobin A1c; Future  4. B12 deficiency She does still take supplementation.  5. Bipolar 1 disorder ([place]) Currently being treated by psychiatry.  She is taking Vraylar 6 mg daily, Latuda 80 mg nightly, and Seroquel 800 mg at bedtime.  6. Anxiety See above.  Lorazepam 3 mg by mouth daily  7. Elevated serum creatinine - Comprehensive metabolic panel; Future  8. Hyperlipidemia, unspecified hyperlipidemia type Lipitor 40 mg daily - Comprehensive metabolic panel; Future - Lipid panel; Future  9. Acute pain of left knee She has visit with Ortho next week.  She is not getting any relief with meloxicam.  Going to send diclofenac for her to try with food twice a day to help get her through to her appointment.  Okay to take Tylenol in addition.  Continue to ice and elevate. - diclofenac (VOLTAREN) 75 MG EC tablet; Take 1 tablet (75 mg total) by mouth 2 (two) times daily.  Dispense: 30 tablet; Refill: 0  Return in about 3 months (around 11/03/2021) for Chronic condition visit.    Send results to taylor dale (requested sed rate) 45 minutes spent in chart review, charting, exam, time with patient,  discussion of follow up plan/treatment options   Micheline Rough, MD

## 2021-08-03 NOTE — Telephone Encounter (Signed)
Spoke with patient to follow up with her to see if she has received clearance from the ophthalmologist to restart Cimzia. Patient states she will contact them and have them fax a letter.

## 2021-08-04 MED ORDER — BREZTRI AEROSPHERE 160-9-4.8 MCG/ACT IN AERO: 2.0000 | INHALATION_SPRAY | Freq: Two times a day (BID) | RESPIRATORY_TRACT | 2 refills | Status: DC

## 2021-08-06 ENCOUNTER — Encounter: Payer: Medicare Other | Admitting: Physical Therapy

## 2021-08-06 ENCOUNTER — Ambulatory Visit: Payer: Medicare Other | Admitting: Orthopaedic Surgery

## 2021-08-07 ENCOUNTER — Telehealth: Payer: Self-pay | Admitting: Orthopaedic Surgery

## 2021-08-07 NOTE — Telephone Encounter (Signed)
Error

## 2021-08-08 ENCOUNTER — Encounter: Payer: Medicare Other | Admitting: Rehabilitative and Restorative Service Providers"

## 2021-08-13 ENCOUNTER — Other Ambulatory Visit: Payer: Self-pay

## 2021-08-14 ENCOUNTER — Ambulatory Visit (INDEPENDENT_AMBULATORY_CARE_PROVIDER_SITE_OTHER): Payer: Medicare Other | Admitting: Family Medicine

## 2021-08-14 ENCOUNTER — Encounter: Payer: Self-pay | Admitting: Family Medicine

## 2021-08-14 VITALS — BP 128/80 | HR 93 | Temp 97.8°F | Resp 16 | Ht 70.0 in

## 2021-08-14 DIAGNOSIS — N183 Chronic kidney disease, stage 3 unspecified: Secondary | ICD-10-CM | POA: Diagnosis not present

## 2021-08-14 DIAGNOSIS — M25562 Pain in left knee: Secondary | ICD-10-CM | POA: Diagnosis not present

## 2021-08-14 MED ORDER — DICLOFENAC SODIUM 1 % EX GEL: 2.0000 g | Freq: Four times a day (QID) | CUTANEOUS | 1 refills | Status: DC

## 2021-08-14 MED ORDER — TRAMADOL HCL 50 MG PO TABS
50.0000 mg | ORAL_TABLET | Freq: Three times a day (TID) | ORAL | 0 refills | Status: AC | PRN
Start: 2021-08-14 — End: 2021-08-19

## 2021-08-14 NOTE — Patient Instructions (Addendum)
A few things to remember from today's visit:  Acute pain of left knee - Plan: diclofenac Sodium (VOLTAREN) 1 % GEL, traMADol (ULTRAM) 50 MG tablet  If you need refills please call your pharmacy. Do not use My Chart to request refills or for acute issues that need immediate attention.   Tramadol can cause drowsiness and can interact with some of your meds. Keep appt with ortho. Continue daily activities as tolerated. Fall precautions.  Please be sure medication list is accurate. If a new problem present, please set up appointment sooner than planned today.

## 2021-08-14 NOTE — Progress Notes (Signed)
ACUTE VISIT Chief Complaint  Patient presents with   Knee Pain    Left knee, swollen, trouble walking. Has been ongoing for 3 weeks, pain became worse yesterday. Pt does have an appointment with ortho, but it is not until next week.   HPI: Judy [name] is a 70 y.o. female with hx of bipolar disorder,anxiety,COPD,and hyperglycemia here today complaining of worsening left knee pain. The pain has been ongoing for about 3 weeks, but became worse yesterday. She was barely able to walk due to the pain. She does have an appointment scheduled with Dr. Ninfa Linden (ortho), but is unable to see him until next week.   Pain is better today. Exacerbated by any movement/walking and alleviated by rest. Yesterday pain was 9-10/10, stubbing, constant,not radiated. Pain is localized on anterior and medial aspect of knee. + Effusion, she has not noted erythema. No hx of trauma. + Limitation of ROM.  About a year ago she had similar symptoms, according to patient, joint aspiration was performed and it helped. She has had 3 episodes of acute knee pain in the past 4 months. She was prescribed diclofenac but is not helping.  No known hx of HTN. Has had abnormal renal functions test since 01/2021.  Lab Results  Component Value Date   CREATININE 1.19 (H) 04/11/2021   BUN 12 04/11/2021   NA 140 04/11/2021   K 4.1 04/11/2021   CL 107 04/11/2021   CO2 23 04/11/2021   Review of Systems  Constitutional:  Positive for activity change and fatigue. Negative for appetite change, chills and fever.  Respiratory:  Negative for cough, shortness of breath and wheezing.   Cardiovascular:  Negative for chest pain and palpitations.  Genitourinary:  Negative for decreased urine volume and hematuria.  Musculoskeletal:  Positive for arthralgias and gait problem.  Skin:  Negative for rash.  Rest see pertinent positives and negatives per HPI.  Current Outpatient Medications on File Prior to Visit  Medication  Sig Dispense Refill   Acetaminophen (TYLENOL 8 HOUR PO) Take 500 mg by mouth.     albuterol (VENTOLIN HFA) 108 (90 Base) MCG/ACT inhaler Inhale 1-2 puffs into the lungs every 6 (six) hours as needed for wheezing or shortness of breath. 8 g 5   atorvastatin (LIPITOR) 40 MG tablet Take 1 tablet by mouth once daily 90 tablet 0   Budeson-Glycopyrrol-Formoterol (BREZTRI AEROSPHERE) 160-9-4.8 MCG/ACT AERO Inhale 2 puffs into the lungs 2 (two) times daily. 10.7 g 2   cariprazine (VRAYLAR) capsule Take 6 mg by mouth daily.      Certolizumab Pegol (CIMZIA PREFILLED) 2 X 200 MG/ML KIT Inject 400 mg into the skin every 30 (thirty) days.      Cyanocobalamin (VITAMIN B-12 PO) Take by mouth daily.     LORazepam (ATIVAN) 1 MG tablet Take 3 mg by mouth daily.     lurasidone (LATUDA) 80 MG TABS tablet Take 80 mg by mouth every evening.      naltrexone (DEPADE) 50 MG tablet Take 50 mg by mouth every morning.     QUEtiapine (SEROQUEL) 400 MG tablet Take 800 mg by mouth at bedtime.     Spacer/Aero-Holding Chambers DEVI 1 Device by Does not apply route daily as needed. 1 each 0   No current facility-administered medications on file prior to visit.   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   Bipolar 1 disorder (HCC)    COPD (chronic obstructive pulmonary disease) (HCC)    Pneumonia    PTSD (post-traumatic stress disorder)    Allergies  Allergen Reactions   Celexa [Citalopram Hydrobromide]     "goes weird"   Gabapentin     "goes weird"     Social History   Socioeconomic History   Marital status: Divorced    Spouse name: Not on file   Number of children: Not on file   Years of education: Not on file   Highest education level: Not on file  Occupational History   Occupation: retired  Tobacco Use   Smoking status: Former     Years: 43.00    Types: Cigarettes    Quit date: 05/19/2020    Years since quitting: 1.2   Smokeless tobacco: Never  Vaping Use   Vaping Use: Never used  Substance and Sexual Activity   Alcohol use: Not Currently   Drug use: No   Sexual activity: Not on file  Other Topics Concern   Not on file  Social History Narrative   Not on file   Social Determinants of Health   Financial Resource Strain: Low Risk    Difficulty of Paying Living Expenses: Not hard at all  Food Insecurity: No Food Insecurity   Worried About Charity fundraiser in the Last Year: Never true   [place] in the Last Year: Never true  Transportation Needs: No Transportation Needs   Lack of Transportation (Medical): No   Lack of Transportation (Non-Medical): No  Physical Activity: Insufficiently Active   Days of Exercise per Week: 3 days   Minutes of Exercise per Session: 30 min  Stress: No Stress Concern Present   Feeling of Stress : Only a little  Social Connections: Socially Isolated   Frequency of Communication with Friends and Family: Once a week   Frequency of Social Gatherings with Friends and Family: Once a week   Attends Religious Services: More than 4 times per year   Active Member of Clubs or Organizations: No   Attends Archivist Meetings: Never   Marital Status: Divorced   Vitals:   08/14/21 0754  BP: 128/80  Pulse: 93  Resp: 16  Temp: 97.8 F (36.6 C)  SpO2: 97%   Body mass index is 33.58 kg/m.  Physical Exam Vitals and nursing note reviewed.  Constitutional:      General: She is not in acute distress.    Appearance: She is well-developed. She is not ill-appearing.  HENT:     Head: Normocephalic and atraumatic.  Eyes:     Conjunctiva/sclera: Conjunctivae normal.  Cardiovascular:     Rate and Rhythm: Normal rate and regular rhythm.     Pulses:          Dorsalis pedis pulses are 2+ on the left side.       Posterior tibial pulses are 2+ on the left side.      Comments: Negative for left calf tenderness. Pulmonary:     Effort: Pulmonary effort is normal. No respiratory distress.  Musculoskeletal:     Left knee: Effusion (mild.) and crepitus present. No erythema or bony tenderness. Decreased range of motion (Flexion,mild). Tenderness (with movement.) present. No medial joint line, lateral joint line or patellar tendon tenderness.     Instability Tests: Anterior drawer test negative. Posterior drawer  test negative.  Skin:    General: Skin is warm.     Findings: No erythema or rash.  Neurological:     General: No focal deficit present.     Mental Status: She is alert and oriented to person, place, and time.     Comments: Antalgic gait assisted with a cane.  Psychiatric:        Mood and Affect: Mood is anxious.   ASSESSMENT AND PLAN:  Ms.Chastelyn was seen today for knee pain.  Diagnoses and all orders for this visit:  Acute pain of left knee Chronic problem with acute exacerbation. We discussed options at this time: Joint aspiration and intra-articular steroid injection versus pain management until she sees orthopedics next week, she prefers the latter option. I do not think imaging is needed at this time. We do have many options in regard to pain management. She has taking hydrocodone-acetaminophen in the past.  She is on Naltrexone, she is not sure about indication.  Tramadol 50 mg 3 times daily as needed recommended, we discussed side effects. Voltaren gel 3-4 times per day may also help.  -     diclofenac Sodium (VOLTAREN) 1 % GEL; Apply 2 g topically 4 (four) times daily. -     traMADol (ULTRAM) 50 MG tablet; Take 1 tablet (50 mg total) by mouth every 8 (eight) hours as needed for up to 5 days.  Stage 3 chronic kidney disease, unspecified whether stage 3a or 3b CKD (HCC) Cr 1.09-1.19 and e GFR 47 and 52 since 02/2021. Advise caution with oral NSAIDs, recommend avoiding them. Adequate hydration. Continue following with  PCP.  Return if symptoms worsen or fail to improve.  Tashiana Lamarca G. [place], MD  [facility]. [place] office.

## 2021-08-20 ENCOUNTER — Encounter: Payer: Self-pay | Admitting: Orthopaedic Surgery

## 2021-08-20 ENCOUNTER — Other Ambulatory Visit: Payer: Self-pay

## 2021-08-20 ENCOUNTER — Ambulatory Visit (INDEPENDENT_AMBULATORY_CARE_PROVIDER_SITE_OTHER): Payer: Medicare Other | Admitting: Orthopaedic Surgery

## 2021-08-20 DIAGNOSIS — M25462 Effusion, left knee: Secondary | ICD-10-CM

## 2021-08-20 DIAGNOSIS — M25562 Pain in left knee: Secondary | ICD-10-CM | POA: Diagnosis not present

## 2021-08-20 DIAGNOSIS — G8929 Other chronic pain: Secondary | ICD-10-CM

## 2021-08-20 MED ORDER — LIDOCAINE HCL 1 % IJ SOLN
3.0000 mL | INTRAMUSCULAR | Status: AC | PRN
  Administered 2021-08-20: 3 mL

## 2021-08-20 MED ORDER — METHYLPREDNISOLONE ACETATE 40 MG/ML IJ SUSP
40.0000 mg | INTRAMUSCULAR | Status: AC | PRN
  Administered 2021-08-20: 40 mg via INTRA_ARTICULAR

## 2021-08-20 NOTE — Progress Notes (Signed)
Office Visit Note   [name]           Date of Birth: Feb 10, 1951           MRN: [id number] Visit Date: 08/20/2021              Requested by: Wynn Banker, MD [address] [place],  [place] [address] PCP: Wynn Banker, MD   Assessment & Plan: Visit Diagnoses:  1. Effusion, left knee   2. Chronic pain of left knee     Plan: I aspirated about 30 cc of fluid off of her left knee and placed a steroid in that knee today.  At this point a MRI is warranted to assess the cartilage of her knee and the meniscus given this recurrent effusion and the fact that she has failed conservative treatment for over 6 months now.  This includes physical therapy that we sent her for as well.  We will see her back after the MRI of the left knee.  She agrees with this treatment option and plan as well.  Follow-Up Instructions: Return in about 3 weeks (around 09/10/2021).   Orders:  No orders of the defined types were placed in this encounter.  No orders of the defined types were placed in this encounter.     Procedures: Large Joint Inj: L knee on 08/20/2021 2:56 PM Indications: diagnostic evaluation and pain Details: 22 G 1.5 in needle, superolateral approach  Arthrogram: No  Medications: 3 mL lidocaine 1 %; 40 mg methylPREDNISolone acetate 40 MG/ML Outcome: tolerated well, no immediate complications Procedure, treatment alternatives, risks and benefits explained, specific risks discussed. Consent was given by the patient. Immediately prior to procedure a time out was called to verify the correct patient, procedure, equipment, support staff and site/side marked as required. Patient was prepped and draped in the usual sterile fashion.      Clinical Data: No additional findings.   Subjective: Chief Complaint  Patient presents with   Right Hip - Follow-up   Left Hip - Follow-up  She is well-known to me.  She comes in today with left knee swelling and  pain.  We have seen her for her knee before.  We have actually replaced her hips before.  She is walking with a cane.  She says last week the knee was really swollen.  She says her hip replacements have done great for her.  We replaced both of them in 2020.  She does ambit with a cane.  She has tried activity modification and time.  X-rays of her left knee in January of this year still showed a well-maintained joint space with her left knee.  She is also tried Voltaren gel.  HPI  Review of Systems There is currently no acute change in medical status  Objective: Vital Signs: There were no vitals taken for this visit.  Physical Exam She is alert and orient x3 and in no acute distress Ortho Exam Examination of both knees together shows a moderate effusion of her left knee and warmth of the left knee compared that to the right knee.  Her range of motion is limited due to the swelling and it is painful throughout the arc of motion of the knee.  Her left knee is loosely stable. Specialty Comments:  No specialty comments available.  Imaging: No results found.   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   Status post total replacement of left hip 03/19/2019   PTSD (post-traumatic stress disorder)    COPD (chronic obstructive pulmonary disease) (HCC)    Bipolar 1 disorder (HCC)    Anxiety    B12 deficiency 12/09/2018   Hyperglycemia 04/14/2017   Past Medical History:  Diagnosis Date   Anxiety    Arthritis    Avascular necrosis (HCC)    Left hip   Avascular necrosis of bone of hip, 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   Bipolar 1 disorder (HCC)    COPD (chronic obstructive pulmonary disease) (HCC)    Pneumonia    PTSD (post-traumatic stress disorder)     Family History  Problem Relation Age  of Onset   CAD Mother 77   COPD Mother    Depression Mother    Heart attack Father    Colon polyps Sister    Heart attack Maternal Grandmother    Early death Maternal Grandfather    Hearing loss Paternal Grandfather    Bipolar disorder Son    Asthma Daughter     Past Surgical History:  Procedure Laterality Date   bone spurs foot Right    COLONOSCOPY     HAND SURGERY Left    JOINT REPLACEMENT     TONSILLECTOMY     TONSILLECTOMY AND ADENOIDECTOMY     TOTAL HIP ARTHROPLASTY Left 03/19/2019   Procedure: LEFT TOTAL HIP ARTHROPLASTY ANTERIOR APPROACH;  Surgeon: [name], MD;  Location: [facility];  Service: Orthopedics;  Laterality: Left;   TOTAL HIP ARTHROPLASTY Right 06/04/2019   Procedure: RIGHT TOTAL HIP ARTHROPLASTY ANTERIOR APPROACH;  Surgeon: [name], MD;  Location: [facility];  Service: Orthopedics;  Laterality: Right;   Social History   Occupational History   Occupation: retired  Tobacco Use   Smoking status: Former    Years: 43.00    Types: Cigarettes    Quit date: 05/19/2020    Years since quitting: 1.2   Smokeless tobacco: Never  Vaping Use   Vaping Use: Never used  Substance and Sexual Activity   Alcohol use: Not Currently   Drug use: No   Sexual activity: Not on file

## 2021-08-21 ENCOUNTER — Telehealth: Payer: Self-pay | Admitting: Family Medicine

## 2021-08-21 NOTE — Telephone Encounter (Signed)
I called and spoke with Judy [name]'s office in regards to the Naltrexone. Awaiting a return call.

## 2021-08-21 NOTE — Telephone Encounter (Signed)
Pt seen dr [place] on 08-14-2021 and was prescribed tramadol  per pt the pharm will not fill tramadol due to interaction with another medication she is taking pt does not know which medication.  [facility] [address] [place] - [address] Phone:  [phone]  Fax:  [phone]

## 2021-08-21 NOTE — Telephone Encounter (Signed)
Is there any other medication she can try that won't interact with the naltrexone?

## 2021-08-22 NOTE — Telephone Encounter (Signed)
I called and spoke with patient. She will continue to use the voltaren gel as needed & will continue following with Dr. Magnus Ivan. She had her knee drained on 11/14 and has an MRI coming up in 3 weeks.

## 2021-08-31 NOTE — Progress Notes (Signed)
Office Visit Note  [name]             Date of Birth: 1951-03-28           MRN: [id number]             PCP: Caren Macadam, MD Referring: Caren Macadam, MD Visit Date: 09/12/2021 Occupation: @GUAROCC @  Subjective:  Arthritis (Left knee pain, swelling)   History of Present Illness: Judy [name] is a 70 y.o. female with a history of ankylosing spondylitis and osteoarthritis.  She states she has been off Cimzia since September 2022 due to cataract surgery.  She has another eye surgery scheduled for on 18 December.  She had increased pain and swelling in her left knee joint.  She states she had left knee joint aspiration and cortisone injection by Dr. Ninfa Linden 3 weeks ago.  She is scheduled for MRI of her left knee joint.  Continues to have pain and discomfort in her entire spine.  Activities of Daily Living:  Patient reports morning stiffness for 0  none .   Patient Reports nocturnal pain.  Difficulty dressing/grooming: Reports Difficulty climbing stairs: Reports Difficulty getting out of chair: Reports Difficulty using hands for taps, buttons, cutlery, and/or writing: Denies  Review of Systems  Constitutional:  Positive for fatigue.  HENT:  Positive for mouth dryness.   Eyes:  Negative for dryness.  Respiratory:  Positive for shortness of breath.   Cardiovascular:  Positive for swelling in legs/feet.  Gastrointestinal:  Negative for constipation.  Endocrine: Negative for increased urination.  Genitourinary:  Negative for difficulty urinating.  Musculoskeletal:  Positive for joint pain, gait problem, joint pain, joint swelling and muscle weakness.  Skin:  Negative for rash.  Allergic/Immunologic: Negative for susceptible to infections.  Neurological:  Negative for numbness.  Hematological:  Negative for bruising/bleeding tendency.  Psychiatric/Behavioral:  Negative for sleep disturbance.    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   Status post total replacement of left hip 03/19/2019   PTSD (post-traumatic stress disorder)    COPD (chronic obstructive pulmonary disease) (HCC)    Bipolar 1 disorder (HCC)    Anxiety    B12 deficiency 12/09/2018   Hyperglycemia 04/14/2017    Past Medical History:  Diagnosis Date   Anxiety    Arthritis    Avascular necrosis (HCC)    Left hip   Avascular necrosis of bone of hip, left ([place]) 12/28/2018   Avascular necrosis of bone of right hip ([place]) 04/28/2019   Bipolar 1 disorder (HCC)    COPD (chronic obstructive pulmonary disease) (HCC)    Pneumonia    PTSD (post-traumatic stress disorder)     Family History  Problem Relation Age of Onset   CAD Mother 73   COPD Mother    Depression Mother    Heart attack Father    Colon polyps Sister    Heart attack Maternal Grandmother    Early death Maternal Grandfather    Hearing loss Paternal Grandfather    Bipolar disorder Son    Asthma Daughter    Past Surgical History:  Procedure Laterality Date   bone spurs foot Right    CATARACT EXTRACTION     COLONOSCOPY     HAND SURGERY Left    JOINT REPLACEMENT     TONSILLECTOMY  TONSILLECTOMY AND ADENOIDECTOMY     TOTAL HIP ARTHROPLASTY Left 03/19/2019   Procedure: LEFT TOTAL HIP ARTHROPLASTY ANTERIOR APPROACH;  Surgeon: [name], MD;  Location: [facility];  Service: Orthopedics;  Laterality: Left;   TOTAL HIP ARTHROPLASTY Right 06/04/2019   Procedure: RIGHT TOTAL HIP ARTHROPLASTY ANTERIOR APPROACH;  Surgeon: [name], MD;  Location: [facility];  Service: Orthopedics;  Laterality: Right;   Social History   Social History Narrative   Not on file   Immunization History  Administered Date(s) Administered   Influenza, High Dose Seasonal PF 08/25/2018   Moderna Sars-Covid-2 Vaccination 11/08/2019, 12/05/2019,  10/09/2020   Pneumococcal Conjugate-13 09/23/2018   Pneumococcal Polysaccharide-23 08/27/2016   Tdap 10/07/2012   Yellow Fever 07/05/2008     Objective: Vital Signs: BP 128/79 (BP Location: Left Arm, Patient Position: Sitting, Cuff Size: Normal)   Pulse 99   Resp 20   Ht 5\' 10"  (1.778 m)   Wt 229 lb (103.9 kg)   BMI 32.86 kg/m    Physical Exam Vitals and nursing note reviewed.  Constitutional:      Appearance: She is well-developed.  HENT:     Head: Normocephalic and atraumatic.  Eyes:     Conjunctiva/sclera: Conjunctivae normal.  Cardiovascular:     Rate and Rhythm: Normal rate and regular rhythm.     Heart sounds: Normal heart sounds.  Pulmonary:     Effort: Pulmonary effort is normal.     Breath sounds: Normal breath sounds.  Abdominal:     General: Bowel sounds are normal.     Palpations: Abdomen is soft.  Musculoskeletal:     Cervical back: Normal range of motion.  Lymphadenopathy:     Cervical: No cervical adenopathy.  Skin:    General: Skin is warm and dry.     Capillary Refill: Capillary refill takes less than 2 seconds.  Neurological:     Mental Status: She is alert and oriented to person, place, and time.  Psychiatric:        Behavior: Behavior normal.     Musculoskeletal Exam: Limited range of motion of her cervical spine.  She had limited range of motion of her thoracic and lumbar spine.  She had difficulty raising her arms.  She had no tenderness on palpation of her shoulders.  Elbow joints and wrist joints with good range of motion.  She had bilateral PIP and DIP thickening with no synovitis.  Hip joints and knee joints in good range of motion.  She had no tenderness over ankles or MTPs.  CDAI Exam: CDAI Score: -- Patient Global: --; Provider Global: -- Swollen: --; Tender: -- Joint Exam 09/12/2021   No joint exam has been documented for this visit   There is currently no information documented on the homunculus. Go to the Rheumatology activity  and complete the homunculus joint exam.  Investigation: No additional findings.  Imaging: No results found.  Recent Labs: Lab Results  Component Value Date   WBC 5.7 04/11/2021   HGB 13.2 04/11/2021   PLT 218 04/11/2021   NA 140 04/11/2021   K 4.1 04/11/2021   CL 107 04/11/2021   CO2 23 04/11/2021   GLUCOSE 168 (H) 04/11/2021   BUN 12 04/11/2021   CREATININE 1.19 (H) 04/11/2021   BILITOT 0.4 04/11/2021   ALKPHOS 144 (H) 10/30/2020   AST 13 04/11/2021   ALT 20 04/11/2021   PROT 6.5 04/11/2021   ALBUMIN 4.2 10/30/2020   CALCIUM 9.6 04/11/2021   GFRAA  54 (L) 04/11/2021   QFTBGOLDPLUS NEGATIVE 04/11/2021    Speciality Comments: Cimzia completed loading on June 22, 2020  Procedures:  No procedures performed Allergies: Celexa [citalopram hydrobromide] and Gabapentin   Assessment / Plan:     Visit Diagnoses: 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 ([place]) - C-spine, thoracic spine, and lumbar spine fused.  Patient had limited range of motion of thoracic and lumbar spine.  She had discomfort in the lumbar region.  She has some discomfort over SI joints.  I advised her to get labs prior to her Cimzia injection.  She will need clearance from the ophthalmologist before starting Cimzia injections.  When she starts Cimzia injections we will get labs in a month and then every 3 months to monitor for drug toxicity.  High risk medication use - Cimzia 400 mg sq injections every 4 weeks-Administered in the office. Completed loading for cimzia on 06/22/20. She declined self-injections.  She has been off Cimzia due to eye surgeries in September 2022.  She will have another surgery in mid December.- Plan: CBC with Differential/Platelet, COMPLETE METABOLIC PANEL WITH GFR.  Information regarding ministration was placed in the AVS.  She has been also advised to hold Cimzia in case she develops an infection in the future.  She may resume Cimzia since her infection resolves.  She was also  advised to get an annual skin examination to screen for skin cancer while she is on Cimzia.  DDD (degenerative disc disease), lumbar-she continues to have lower back pain and discomfort.  Spinal stenosis of lumbosacral region  Chronic SI joint pain-she has some tenderness over SI joints.  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  Chronic pain of left knee-she has been having increased pain and discomfort in the left knee joint.  She was recently evaluated by Dr. Ninfa Linden and had left knee joint aspiration and injection.  She is scheduled to have MRI of her left knee joint.  Other medical problems are listed as follows:  Bipolar 1 disorder ([place])  PTSD (post-traumatic stress disorder)  History of COPD - Followed by Dr. Vaughan Browner.  She has chronic shortness of breath.  Pulmonary nodules  B12 deficiency  Former smoker  History of anxiety  Smoker  Orders: Orders Placed This Encounter  Procedures   CBC with Differential/Platelet   COMPLETE METABOLIC PANEL WITH GFR    No orders of the defined types were placed in this encounter.   Follow-Up Instructions: Return in about 3 months (around 12/11/2021) for Ankylosing spondylitis.   Bo Merino, MD  Note - This record has been created using Editor, commissioning.  Chart creation errors have been sought, but may not always  have been located. Such creation errors do not reflect on  the standard of medical care.

## 2021-09-10 ENCOUNTER — Ambulatory Visit: Payer: Medicare Other | Admitting: Orthopaedic Surgery

## 2021-09-12 ENCOUNTER — Other Ambulatory Visit: Payer: Self-pay

## 2021-09-12 ENCOUNTER — Encounter: Payer: Self-pay | Admitting: Rheumatology

## 2021-09-12 ENCOUNTER — Ambulatory Visit (INDEPENDENT_AMBULATORY_CARE_PROVIDER_SITE_OTHER): Payer: Medicare Other | Admitting: Rheumatology

## 2021-09-12 VITALS — BP 128/79 | HR 99 | Resp 20 | Ht 70.0 in | Wt 229.0 lb

## 2021-09-12 DIAGNOSIS — Z8659 Personal history of other mental and behavioral disorders: Secondary | ICD-10-CM

## 2021-09-12 DIAGNOSIS — Z96643 Presence of artificial hip joint, bilateral: Secondary | ICD-10-CM

## 2021-09-12 DIAGNOSIS — M45 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: Secondary | ICD-10-CM

## 2021-09-12 DIAGNOSIS — R918 Other nonspecific abnormal finding of lung field: Secondary | ICD-10-CM

## 2021-09-12 DIAGNOSIS — M5136 Other intervertebral disc degeneration, lumbar region: Secondary | ICD-10-CM | POA: Diagnosis not present

## 2021-09-12 DIAGNOSIS — M87052 Idiopathic aseptic necrosis of left femur: Secondary | ICD-10-CM

## 2021-09-12 DIAGNOSIS — Z87891 Personal history of nicotine dependence: Secondary | ICD-10-CM

## 2021-09-12 DIAGNOSIS — G8929 Other chronic pain: Secondary | ICD-10-CM

## 2021-09-12 DIAGNOSIS — Z79899 Other long term (current) drug therapy: Secondary | ICD-10-CM | POA: Diagnosis not present

## 2021-09-12 DIAGNOSIS — M4807 Spinal stenosis, lumbosacral region: Secondary | ICD-10-CM

## 2021-09-12 DIAGNOSIS — M25562 Pain in left knee: Secondary | ICD-10-CM

## 2021-09-12 DIAGNOSIS — E538 Deficiency of other specified B group vitamins: Secondary | ICD-10-CM

## 2021-09-12 DIAGNOSIS — F17200 Nicotine dependence, unspecified, uncomplicated: Secondary | ICD-10-CM

## 2021-09-12 DIAGNOSIS — Z8709 Personal history of other diseases of the respiratory system: Secondary | ICD-10-CM

## 2021-09-12 DIAGNOSIS — M533 Sacrococcygeal disorders, not elsewhere classified: Secondary | ICD-10-CM

## 2021-09-12 DIAGNOSIS — M87051 Idiopathic aseptic necrosis of right femur: Secondary | ICD-10-CM

## 2021-09-12 DIAGNOSIS — F4310 Post-traumatic stress disorder, unspecified: Secondary | ICD-10-CM

## 2021-09-12 DIAGNOSIS — M51369 Other intervertebral disc degeneration, lumbar region without mention of lumbar back pain or lower extremity pain: Secondary | ICD-10-CM

## 2021-09-12 DIAGNOSIS — F319 Bipolar disorder, unspecified: Secondary | ICD-10-CM

## 2021-09-12 DIAGNOSIS — M24521 Contracture, right elbow: Secondary | ICD-10-CM

## 2021-09-12 NOTE — Patient Instructions (Signed)
Please come for lab work after your surgery to start on Cimzia injections.  We will need a clearance letter from the ophthalmologist before starting Cimzia injections.  Standing Labs We placed an order today for your standing lab work.   Please have your standing labs drawn in 1 month after starting Cimzia and then every 3 months  If possible, please have your labs drawn 2 weeks prior to your appointment so that the provider can discuss your results at your appointment.  Please note that you may see your imaging and lab results in MyChart before we have reviewed them. We may be awaiting multiple results to interpret others before contacting you. Please allow our office up to 72 hours to thoroughly review all of the results before contacting the office for clarification of your results.  We have open lab daily: Monday through Thursday from 1:30-4:30 PM and Friday from 1:30-4:00 PM at the office of Dr. Pollyann Savoy, [facility], [facility].   Please be advised, all patients with office appointments requiring lab work will take precedent over walk-in lab work.  If possible, please come for your lab work on Monday and Friday afternoons, as you may experience shorter wait times. The office is located at [address], [place], [place] [address] No appointment is necessary.   Labs are drawn by [facility]. Please bring your co-pay at the time of your lab draw.  You may receive a bill from [facility] for your lab work.  If you wish to have your labs drawn at another location, please call the office 24 hours in advance to send orders.  If you have any questions regarding directions or hours of operation,  please call [phone].   As a reminder, please drink plenty of water prior to coming for your lab work. Thanks!   Vaccines You are taking a medication(s) that can suppress your immune system.  The following immunizations are recommended: Flu annually Covid-19  Td/Tdap (tetanus,  diphtheria, pertussis) every 10 years Pneumonia (Prevnar 15 then Pneumovax 23 at least 1 year apart.  Alternatively, can take Prevnar 20 without needing additional dose) Shingrix: 2 doses from 4 weeks to 6 months apart  Please check with your PCP to make sure you are up to date.   If you have signs or symptoms of an infection or start antibiotics: First, call your PCP for workup of your infection. Hold your medication through the infection, until you complete your antibiotics, and until symptoms resolve if you take the following: Injectable medication (Actemra, Benlysta, Cimzia, Cosentyx, Enbrel, Humira, Kevzara, Orencia, Remicade, Simponi, Stelara, Taltz, Tremfya) Methotrexate Leflunomide (Arava) Mycophenolate (Cellcept) Harriette Ohara, Olumiant, or Rinvoq  Please get annual skin examination to screen for skin cancer while you are on Cimzia.

## 2021-09-14 ENCOUNTER — Other Ambulatory Visit: Payer: Self-pay

## 2021-09-14 ENCOUNTER — Ambulatory Visit
Admission: RE | Admit: 2021-09-14 | Discharge: 2021-09-14 | Disposition: A | Discharge status: Home / Self Care | Payer: Medicare Other | Source: Ambulatory Visit | Attending: Orthopaedic Surgery | Admitting: Orthopaedic Surgery

## 2021-09-14 DIAGNOSIS — M25562 Pain in left knee: Secondary | ICD-10-CM

## 2021-09-14 DIAGNOSIS — G8929 Other chronic pain: Secondary | ICD-10-CM

## 2021-09-14 IMAGING — MR MR KNEE*L* W/O CM
4 of 7 series · 21 of 40 positions shown · non-contrast
Comparison: None.

CLINICAL DATA: Left knee pain for 3 months.

EXAM:
MRI OF THE LEFT KNEE WITHOUT CONTRAST
TECHNIQUE: Multiplanar, multisequence MR imaging of the knee was performed. No
intravenous contrast was administered.

[Series 3: T2 fat-sat · axial · 4.0mm · 0.50mm/px · z∈[-44,+71]mm · 5 of 24 slices shown]
[im 1/24]
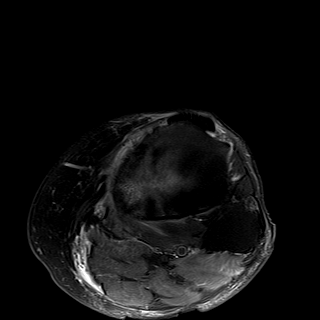
[im 5/24]
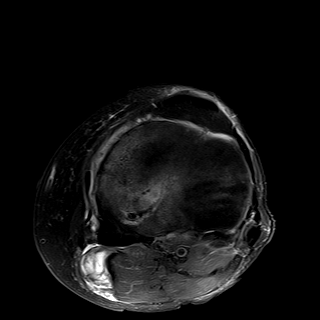
[im 10/24]
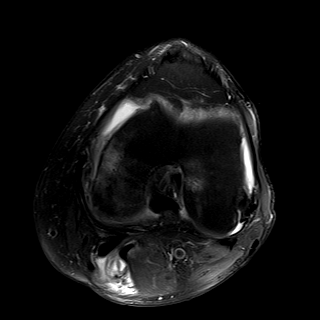
[im 14/24]
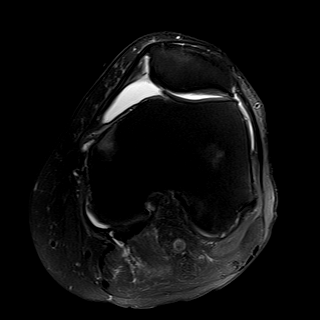
[im 24/24]
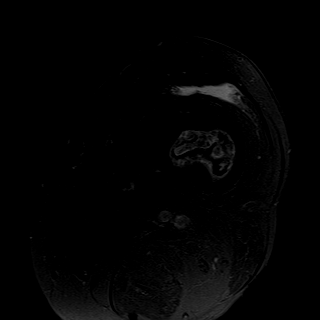

[Series 7: PD fat-sat · sagittal · 3.0mm · 0.29mm/px · 6 of 28 slices shown (1 of 3)]
[im 1/28]
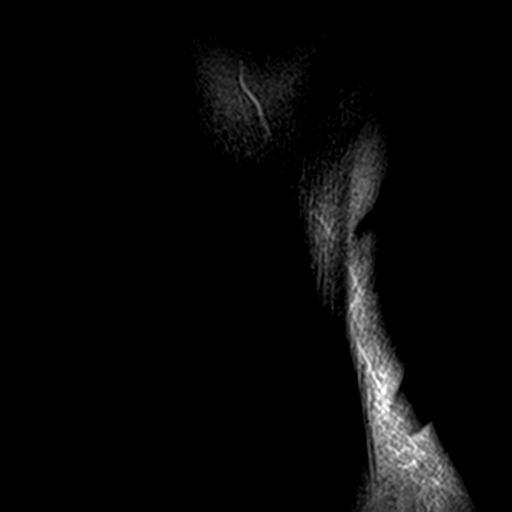
[im 6/28]
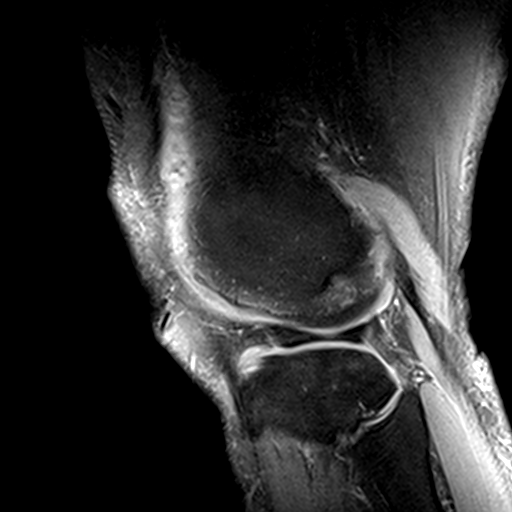
[im 11/28]
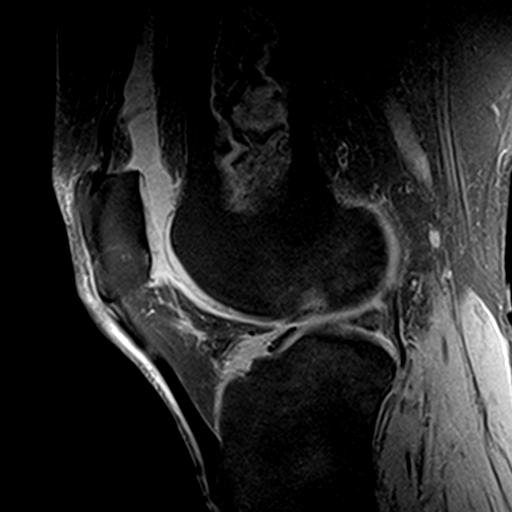
[im 17/28]
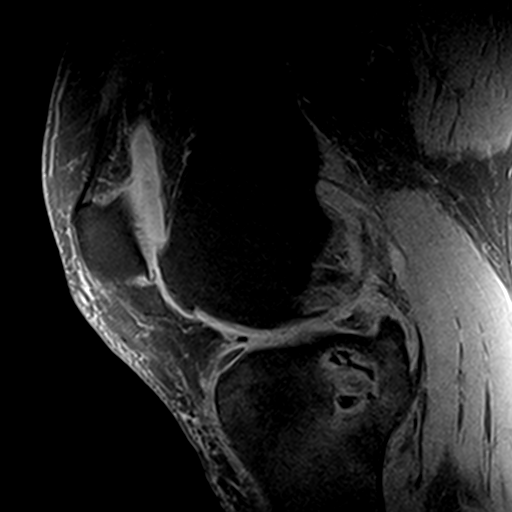
[im 22/28]
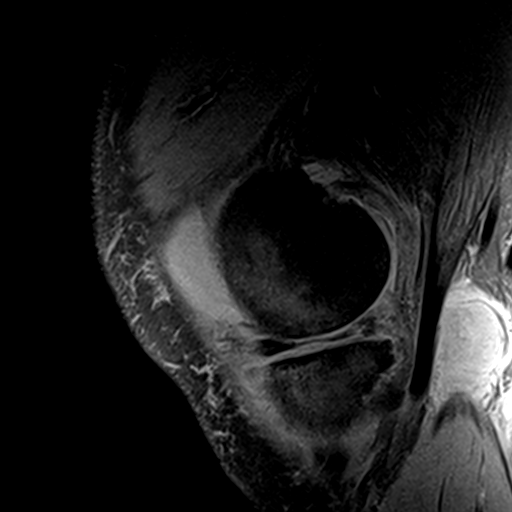
[im 28/28]
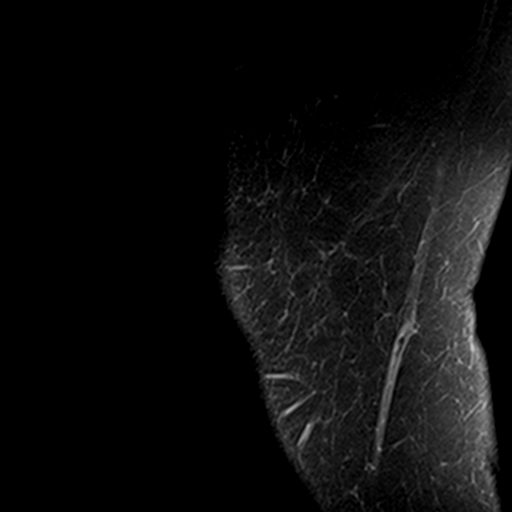

[Series 8: PD fat-sat · coronal · 3.0mm · 0.29mm/px · 7 of 32 slices shown (2 of 3)]
[im 1/32]
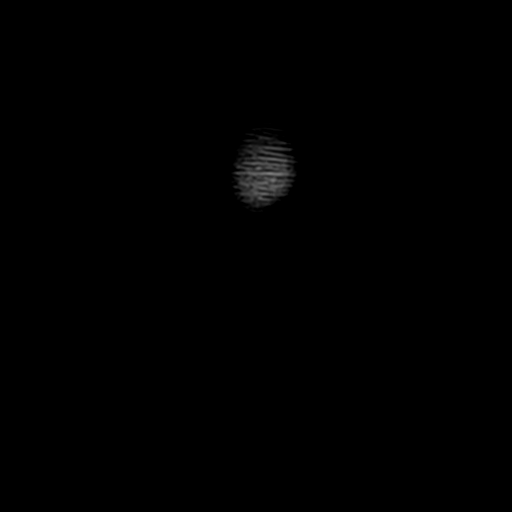
[im 6/32]
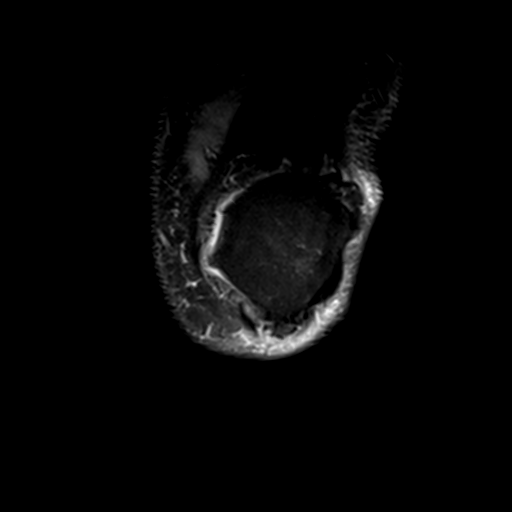
[im 11/32]
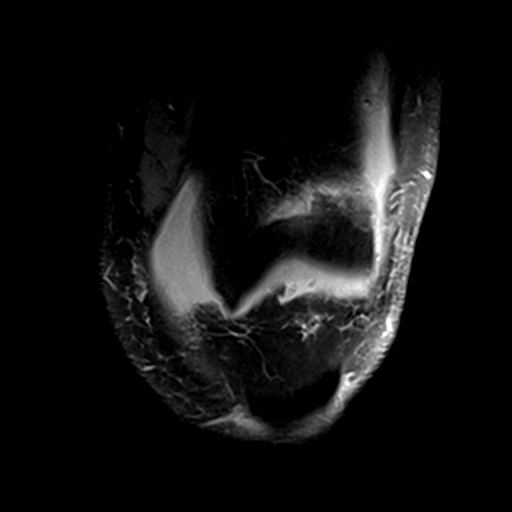
[im 16/32]
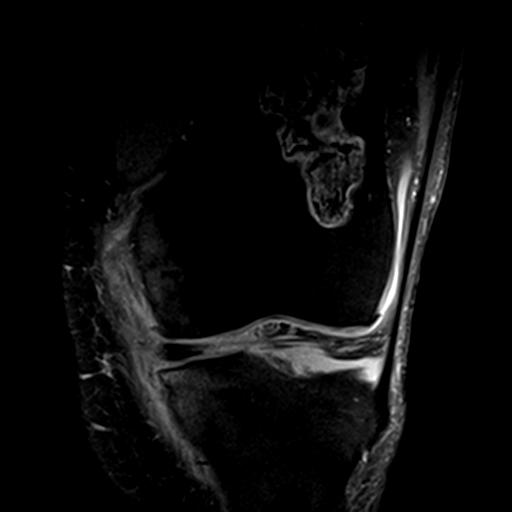
[im 21/32]
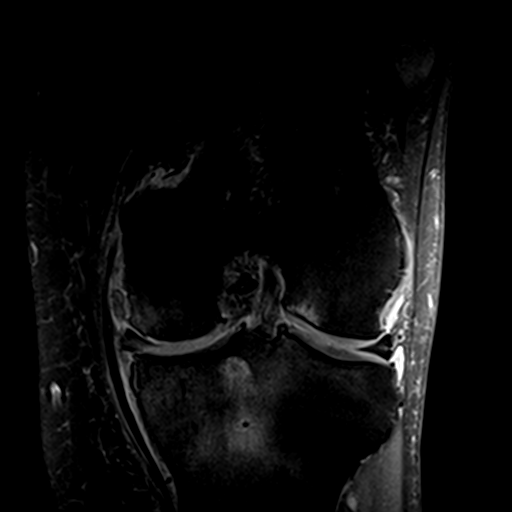
[im 26/32]
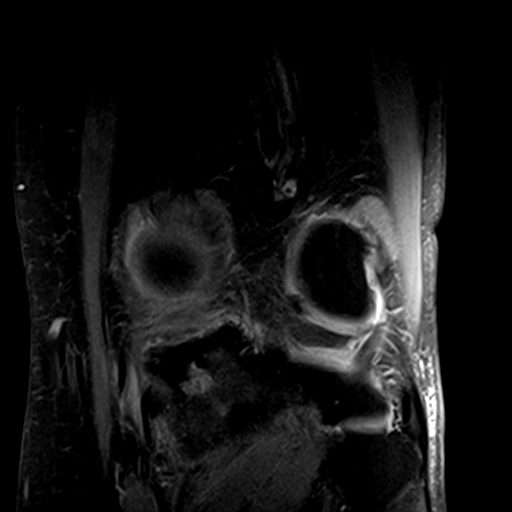
[im 32/32]
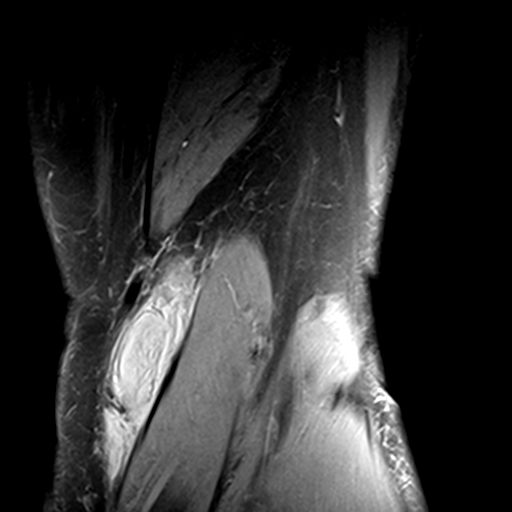

[Series 9: PD fat-sat · oblique · 2.3mm · 0.29mm/px · 3 of 11 slices shown (3 of 3)]
[im 1/11]
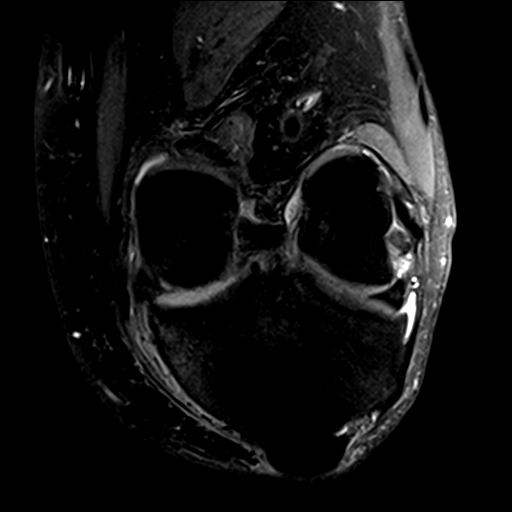
[im 6/11]
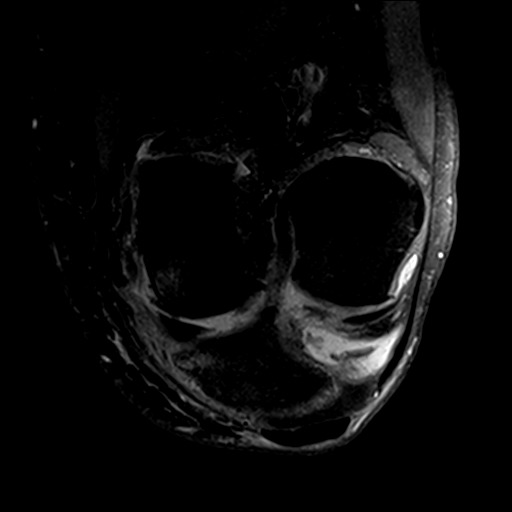
[im 11/11]
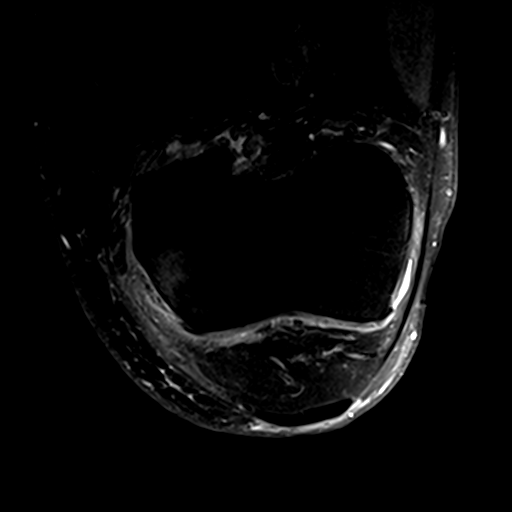

[21 of 40 positions shown; findings below may reference images not displayed]

FINDINGS: MENISCI

Medial: Large radial tear of the posterior horn of medial meniscus
with peripheral meniscal extrusion. Oblique tear of the posterior
horn-body junction of the medial meniscus extending to the inferior
articular surface.

Lateral: Intact.

LIGAMENTS

Cruciates: ACL and PCL are intact.

Collaterals: Medial collateral ligament is intact. Lateral
collateral ligament complex is intact.

CARTILAGE

Patellofemoral: High-grade partial-thickness cartilage loss with
areas of full-thickness cartilage loss of the patellar apex
extending into the lateral patellar facet. Partial-thickness
cartilage loss of the medial patellofemoral compartment.

Medial: Extensive full-thickness cartilage loss of the medial
femoral condyle and medial tibial plateau with subchondral reactive
marrow edema.

Lateral: Partial-thickness cartilage loss of the lateral femoral
condyle with subchondral reactive marrow edema.

JOINT: Moderate joint effusion. Normal DON LOLITO. No plical
thickening.

POPLITEAL FOSSA: Popliteus tendon is intact. Small partially
ruptured Baker's cyst.

EXTENSOR MECHANISM: Intact quadriceps tendon. Intact patellar
tendon. Intact lateral patellar retinaculum. Intact medial patellar
retinaculum. Intact MPFL.

BONES: No aggressive osseous lesion. No fracture or dislocation.
Serpiginous intramedullary bone lesion in the distal femoral
diametaphysis and proximal tibial metaphysis consistent with bone
infarcts. Subtle subchondral linear low signal area involving the
weight-bearing surface of the medial tibial plateau with surrounding
marrow edema most consistent with a subchondral insufficiency
fracture.

Other: No fluid collection or hematoma. Muscles are normal.
IMPRESSION: 1. Nondisplaced, nondepressed subchondral insufficiency fracture of
the medial tibial plateau with surrounding bone marrow edema.
2. Large radial tear of the posterior horn of medial meniscus with
peripheral meniscal extrusion. Oblique tear of the posterior
horn-body junction of the medial meniscus extending to the inferior
articular surface.
3. High-grade partial-thickness cartilage loss with areas of
full-thickness cartilage loss of the patellar apex extending into
the lateral patellar facet. Partial-thickness cartilage loss of the
medial patellofemoral compartment.
4. Extensive full-thickness cartilage loss of the medial femoral
condyle and medial tibial plateau with subchondral reactive marrow
edema.

## 2021-09-19 ENCOUNTER — Ambulatory Visit (INDEPENDENT_AMBULATORY_CARE_PROVIDER_SITE_OTHER): Payer: Medicare Other | Admitting: Physician Assistant

## 2021-09-19 ENCOUNTER — Encounter: Payer: Self-pay | Admitting: Physician Assistant

## 2021-09-19 DIAGNOSIS — M1712 Unilateral primary osteoarthritis, left knee: Secondary | ICD-10-CM

## 2021-09-19 DIAGNOSIS — M84469D Pathological fracture, unspecified tibia and fibula, subsequent encounter for fracture with routine healing: Secondary | ICD-10-CM | POA: Diagnosis not present

## 2021-09-19 NOTE — Progress Notes (Signed)
Office Visit Note   [name]           Date of Birth: [birth year]           MRN: [id number] Visit Date: 09/19/2021              Requested by: Caren Macadam, MD [place],  [place] [address] PCP: Caren Macadam, MD   Assessment & Plan: Visit Diagnoses:  1. Primary osteoarthritis of left knee   2. Insufficiency fracture of tibia with routine healing, subsequent encounter     Plan: Given the extensive arthritis of her knee would not recommend knee arthroscopy.  Would recommend knee replacement when the knee pain becomes such that it is affecting her activities of daily living she is getting no relief with conservative measures.  In regards to the insufficiency fracture due to the fact that she had no known injury would recommend bone density scan if she can arrange this with her primary care doctor.  Also discussed with her that she should discuss with her primary care physician her recent memory changes.  We will see her back in just 6 weeks to see how she is doing overall in regards to the knee.  Questions were encouraged and answered at length today copy of the MRI was given.  Follow-Up Instructions: Return in about 6 weeks (around 10/31/2021).   Orders:  No orders of the defined types were placed in this encounter.  No orders of the defined types were placed in this encounter.     Procedures: No procedures performed   Clinical Data: No additional findings.   Subjective: Chief Complaint  Patient presents with   Left Knee - Pain, Follow-up    HPI Mrs. Busbee returns today to go over the MRI of her left knee.  She continues to have pain in the knee giving way sensation.  She states currently knee pain is 2 out of 10 at worst.  She notes that knee pain is still there but has improved since she was last seen.  She last had a cortisone injection 08/20/2021.  No new injuries.  No known injury to the knee.  However she does report  change in her overall memory and becoming more more forgetful.  She notes that she has looked at her phone today with day of the week it is.  She also reports that she fell asleep last night with her doors open and to her home and that her neighbor had to come over and tell her to close her doors.  She states she was confused at that point time what time a day it actually was. MRI left knee dated 09/16/2021 images were reviewed with the patient.  Insufficiency fracture involving the medial tibial plateau with surrounding bone marrow edema.  Medial meniscal tear of the posterior horn which is a large radial tear.  There is meniscal extrusion.  Posterior horn body junction of the medial meniscus oblique tear.  Patellofemoral joint high-grade partial-thickness cartilage loss to full cartilage loss.  Medial compartment with denuding of cartilage and reactive bone marrow edema.  Lateral compartment partial thickness cartilage loss also with reactive marrow edema.  Review of Systems  Constitutional:  Negative for chills and fever.  Eyes:  Negative for visual disturbance.  Neurological:  Negative for dizziness and headaches.  Psychiatric/Behavioral:  Positive for confusion.     Objective: Vital Signs: There were no vitals taken for this visit.  Physical Exam Constitutional:  Appearance: She is not ill-appearing or diaphoretic.  Neurological:     Mental Status: She is alert.  Psychiatric:        Speech: Speech normal.        Behavior: Behavior is cooperative.        Judgment: Judgment normal.    Ortho Exam Left knee good range of motion.  Tenderness along medial joint line.  Patellofemoral crepitus with passive range of motion. Specialty Comments:  No specialty comments available.  Imaging: No results found.   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) 11/30/2020   Status post total replacement of right  hip 06/04/2019   Status post total replacement of left hip 03/19/2019   PTSD (post-traumatic stress disorder)    COPD (chronic obstructive pulmonary disease) (HCC)    Bipolar 1 disorder (HCC)    Anxiety    B12 deficiency 12/09/2018   Hyperglycemia 04/14/2017   Past Medical History:  Diagnosis Date   Anxiety    Arthritis    Avascular necrosis (HCC)    Left hip   Avascular necrosis of bone of hip, 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   Bipolar 1 disorder (HCC)    COPD (chronic obstructive pulmonary disease) (HCC)    Pneumonia    PTSD (post-traumatic stress disorder)     Family History  Problem Relation Age of Onset   CAD Mother 42   COPD Mother    Depression Mother    Heart attack Father    Colon polyps Sister    Heart attack Maternal Grandmother    Early death Maternal Grandfather    Hearing loss Paternal Grandfather    Bipolar disorder Son    Asthma Daughter     Past Surgical History:  Procedure Laterality Date   bone spurs foot Right    CATARACT EXTRACTION     COLONOSCOPY     HAND SURGERY Left    JOINT REPLACEMENT     TONSILLECTOMY     TONSILLECTOMY AND ADENOIDECTOMY     TOTAL HIP ARTHROPLASTY Left 03/19/2019   Procedure: LEFT TOTAL HIP ARTHROPLASTY ANTERIOR APPROACH;  Surgeon: [name], MD;  Location: [facility];  Service: Orthopedics;  Laterality: Left;   TOTAL HIP ARTHROPLASTY Right 06/04/2019   Procedure: RIGHT TOTAL HIP ARTHROPLASTY ANTERIOR APPROACH;  Surgeon: [name], MD;  Location: [facility];  Service: Orthopedics;  Laterality: Right;   Social History   Occupational History   Occupation: retired  Tobacco Use   Smoking status: Former    Packs/day: 1.25    Years: 43.00    Pack years: 53.75    Types: Cigarettes    Quit date: 05/19/2020    Years since quitting: 1.3   Smokeless tobacco: Never  Vaping Use   Vaping Use: Never used  Substance and Sexual Activity   Alcohol use: Not  Currently   Drug use: No   Sexual activity: Not on file

## 2021-10-03 ENCOUNTER — Telehealth: Payer: Self-pay

## 2021-10-03 NOTE — Telephone Encounter (Signed)
Patient left voice mail inquiring about scheduling TKA.  Not yet ordered per last note.  Please advise.

## 2021-10-04 NOTE — Telephone Encounter (Signed)
I called pt and advised. She wants surgery for feb/march when her daughter will be available to help her

## 2021-10-04 NOTE — Telephone Encounter (Signed)
Please advise 

## 2021-10-05 NOTE — Telephone Encounter (Signed)
She is coming in on 1/25

## 2021-10-19 ENCOUNTER — Other Ambulatory Visit: Payer: Self-pay | Admitting: Family Medicine

## 2021-10-19 ENCOUNTER — Telehealth: Payer: Self-pay | Admitting: Family Medicine

## 2021-10-19 NOTE — Telephone Encounter (Signed)
Rx previously sent by PCP. °

## 2021-10-19 NOTE — Telephone Encounter (Signed)
It was sent 90 with a refill. Ok to resend if needed from pharmacy.

## 2021-10-19 NOTE — Telephone Encounter (Signed)
Patient called because the pharmacy stated that there was no refills on atorvastatin (LIPITOR) 40 MG tablet    Please send refills to   [facility] [address], [place] - [address] Phone:  [phone]  Fax:  [phone]       Please advise

## 2021-10-25 ENCOUNTER — Encounter: Payer: Self-pay | Admitting: Orthopaedic Surgery

## 2021-10-25 ENCOUNTER — Ambulatory Visit (INDEPENDENT_AMBULATORY_CARE_PROVIDER_SITE_OTHER): Payer: Medicare Other | Admitting: Orthopaedic Surgery

## 2021-10-25 ENCOUNTER — Other Ambulatory Visit: Payer: Self-pay

## 2021-10-25 DIAGNOSIS — M1712 Unilateral primary osteoarthritis, left knee: Secondary | ICD-10-CM

## 2021-10-25 NOTE — Progress Notes (Signed)
The patient is well-known to me.  I have replaced both of her hips.  She is an active 71 year old female.  We have been following her with worsening left knee pain and swelling for some time now.  Her plain film showed some moderate arthritis of the knee but the MRI that we obtained of her left knee showed areas of full-thickness cartilage loss in all 3 compartments.  At this point her left knee pain is daily and is 10 out of 10.  She has tried ambulating using a cane.  Now she is developing back pain due to her left knee hurting her.  Both her hip replacements are doing well.  At this point her left knee pain is definitely affecting her mobility, her quality of life, and her actives daily living.  We have talked about knee replacement before for her now she really wants to proceed with a left knee replacement surgery.  She is now diabetic.  She is had no acute change in her medical status otherwise.  She denies any headache, chest pain, shortness of breath, fever, chills, nausea, vomiting  Examination of her left knee shows patellofemoral crepitation and moderate effusion.  There is significant medial joint line tenderness and varus malalignment that is correctable.  There is painful arc of motion of her left knee.  Again I reviewed the MRI of her left knee showing full-thickness cartilage loss in all 3 compartments as well as the plain films.  I did show her knee replacement model and explained in detail what the surgery involves.  I discussed the risks and benefits of surgery and what to expect with an intraoperative and postoperative course.  Her daughter is with her today as well.  They have talked about the possibility of short-term skilled nursing following her hospital stay due to lack of 24-hour care.  All questions and concerns were answered and addressed.  We will work on getting this scheduled in the near future for a left total knee replacement.

## 2021-10-31 ENCOUNTER — Ambulatory Visit: Payer: Medicare Other | Admitting: Physician Assistant

## 2021-10-31 ENCOUNTER — Ambulatory Visit: Payer: Medicare Other | Admitting: Orthopaedic Surgery

## 2021-11-21 ENCOUNTER — Other Ambulatory Visit: Payer: Self-pay

## 2021-11-28 NOTE — Progress Notes (Deleted)
? ?Office Visit Note ? ?[name]             ?Date of Birth: 03/10/1951           ?MRN: [id number]             ?PCP: Caren Macadam, MD ?Referring: Caren Macadam, MD ?Visit Date: 12/12/2021 ?Occupation: @GUAROCC @ ? ?Subjective:  ?No chief complaint on file. ? ? ?History of Present Illness: Judy [name] is a 71 y.o. female ***  ? ?Activities of Daily Living:  ?Patient reports morning stiffness for *** {minute/hour:19697}.   ?Patient {ACTIONS;DENIES/REPORTS:21021675::"Denies"} nocturnal pain.  ?Difficulty dressing/grooming: {ACTIONS;DENIES/REPORTS:21021675::"Denies"} ?Difficulty climbing stairs: {ACTIONS;DENIES/REPORTS:21021675::"Denies"} ?Difficulty getting out of chair: {ACTIONS;DENIES/REPORTS:21021675::"Denies"} ?Difficulty using hands for taps, buttons, cutlery, and/or writing: {ACTIONS;DENIES/REPORTS:21021675::"Denies"} ? ?No Rheumatology ROS completed.  ? ?PMFS History:  ?Patient Active Problem List  ? Diagnosis Date Noted  ? Primary osteoarthritis of left knee 10/25/2021  ? 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 Status post total replacement of right hip 06/04/2019  ? Status post total replacement of left hip 03/19/2019  ? PTSD (post-traumatic stress disorder)   ? COPD (chronic obstructive pulmonary disease) ([place])   ? Bipolar 1 disorder ([place])   ? Anxiety   ? B12 deficiency 12/09/2018  ? Hyperglycemia 04/14/2017  ?  ?Past Medical History:  ?Diagnosis Date  ? Anxiety   ? Arthritis   ? Avascular necrosis ([place])   ? Left hip  ? Avascular necrosis of bone of hip, left ([place]) 12/28/2018  ? Avascular necrosis of bone of right hip ([place]) 04/28/2019  ? Bipolar 1 disorder ([place])   ? COPD (chronic obstructive pulmonary disease) ([place])   ? Pneumonia   ? PTSD (post-traumatic stress disorder)   ?  ?Family History  ?Problem Relation Age of Onset  ? CAD Mother 57  ? COPD Mother   ? Depression Mother   ? Heart attack Father   ? Colon polyps Sister   ? Heart attack  Maternal Grandmother   ? Early death Maternal Grandfather   ? Hearing loss Paternal Grandfather   ? Bipolar disorder Son   ? Asthma Daughter   ? ?Past Surgical History:  ?Procedure Laterality Date  ? bone spurs foot Right   ? CATARACT EXTRACTION    ? COLONOSCOPY    ? HAND SURGERY Left   ? JOINT REPLACEMENT    ? TONSILLECTOMY    ? TONSILLECTOMY AND ADENOIDECTOMY    ? TOTAL HIP ARTHROPLASTY Left 03/19/2019  ? Procedure: LEFT TOTAL HIP ARTHROPLASTY ANTERIOR APPROACH;  Surgeon: Mcarthur Rossetti, MD;  Location: [facility];  Service: Orthopedics;  Laterality: Left;  ? TOTAL HIP ARTHROPLASTY Right 06/04/2019  ? Procedure: RIGHT TOTAL HIP ARTHROPLASTY ANTERIOR APPROACH;  Surgeon: Mcarthur Rossetti, MD;  Location: [facility];  Service: Orthopedics;  Laterality: Right;  ? ?Social History  ? ?Social History Narrative  ? Not on file  ? ?Immunization History  ?Administered Date(s) Administered  ? Influenza, High Dose Seasonal PF 08/25/2018  ? Moderna Sars-Covid-2 Vaccination 11/08/2019, 12/05/2019, 10/09/2020  ? Pneumococcal Conjugate-13 09/23/2018  ? Pneumococcal Polysaccharide-23 08/27/2016  ? Tdap 10/07/2012  ? Yellow Fever 07/05/2008  ?  ? ?Objective: ?Vital Signs: There were no vitals taken for this visit.  ? ?Physical Exam  ? ?Musculoskeletal Exam: *** ? ?CDAI Exam: ?CDAI Score: -- ?Patient Global: --; Provider Global: -- ?Swollen: --; Tender: -- ?Joint Exam 12/12/2021  ? ?No joint exam has been documented for this visit  ? ?  There is currently no information documented on the homunculus. Go to the Rheumatology activity and complete the homunculus joint exam. ? ?Investigation: ?No additional findings. ? ?Imaging: ?No results found. ? ?Recent Labs: ?Lab Results  ?Component Value Date  ? WBC 5.7 04/11/2021  ? HGB 13.2 04/11/2021  ? PLT 218 04/11/2021  ? NA 140 04/11/2021  ? K 4.1 04/11/2021  ? CL 107 04/11/2021  ? CO2 23 04/11/2021  ? GLUCOSE 168 (H) 04/11/2021  ? BUN 12 04/11/2021  ? CREATININE 1.19 (H) 04/11/2021  ?  BILITOT 0.4 04/11/2021  ? ALKPHOS 144 (H) 10/30/2020  ? AST 13 04/11/2021  ? ALT 20 04/11/2021  ? PROT 6.5 04/11/2021  ? ALBUMIN 4.2 10/30/2020  ? CALCIUM 9.6 04/11/2021  ? GFRAA 54 (L) 04/11/2021  ? QFTBGOLDPLUS NEGATIVE 04/11/2021  ? ? ?Speciality Comments: Cimzia completed loading on June 22, 2020 ? ?Procedures:  ?No procedures performed ?Allergies: Celexa [citalopram hydrobromide] and Gabapentin  ? ?Assessment / Plan:     ?Visit Diagnoses: No diagnosis found. ? ?Orders: ?No orders of the defined types were placed in this encounter. ? ?No orders of the defined types were placed in this encounter. ? ? ?Face-to-face time spent with patient was *** minutes. Greater than 50% of time was spent in counseling and coordination of care. ? ?Follow-Up Instructions: No follow-ups on file. ? ? ?Earnestine Mealing, CMA ? ?Note - This record has been created using [employer].  ?Chart creation errors have been sought, but may not always  ?have been located. Such creation errors do not reflect on  ?the standard of medical care.  ?

## 2021-12-07 ENCOUNTER — Other Ambulatory Visit: Payer: Self-pay | Admitting: Orthopaedic Surgery

## 2021-12-07 ENCOUNTER — Telehealth: Payer: Self-pay | Admitting: Orthopaedic Surgery

## 2021-12-07 MED ORDER — TRAMADOL HCL 50 MG PO TABS
50.0000 mg | ORAL_TABLET | Freq: Three times a day (TID) | ORAL | 0 refills | Status: DC | PRN
Start: 2021-12-07 — End: 2021-12-26

## 2021-12-07 NOTE — Telephone Encounter (Signed)
Patient called advised the Meloxicam is not working and asked if she can get something else called into her pharmacy? The number to contact patient is [phone] ?

## 2021-12-07 NOTE — Telephone Encounter (Signed)
Please advise 

## 2021-12-07 NOTE — Progress Notes (Signed)
Sent message, via epic in basket, requesting orders in epic from surgeon.  

## 2021-12-10 NOTE — Progress Notes (Signed)
Office Visit Note  [name]             Date of Birth: 1950/11/15           MRN: [id number]             PCP: Wynn Banker, MD Referring: Wynn Banker, MD Visit Date: 12/11/2021 Occupation: @GUAROCC @  Subjective:  Pain in multiple joints  History of Present Illness: Judy [name] is a 71 y.o. female with history of ankylosing spondylitis.  Her last dose of an office administered Cimzia was in September 2022.  She has had a long gap in therapy due to several eye surgeries.  She is currently scheduled for left total knee arthroplastic surgery which will be performed by Dr. October 2022 on 12/28/2021.  Patient reports that she has not noticed a big difference in her symptoms being off of Cimzia.  She previously tolerated Cimzia without any side effects.  She continues to have chronic pain and stiffness in her neck and lower back.   She is having increased pain in both shoulder joints and has limited range of motion.  She has persistent pain and swelling in the left knee joint. She denies any recent infections.  Activities of Daily Living:  Patient reports morning stiffness for 0 minutes.   Patient Reports nocturnal pain.  Difficulty dressing/grooming: Reports Difficulty climbing stairs: Reports Difficulty getting out of chair: Reports Difficulty using hands for taps, buttons, cutlery, and/or writing: Denies  Review of Systems  Constitutional:  Positive for fatigue.  HENT:  Negative for mouth sores, mouth dryness and nose dryness.   Eyes:  Positive for pain, itching and dryness.  Respiratory:  Positive for shortness of breath and difficulty breathing.   Cardiovascular:  Negative for chest pain and palpitations.  Gastrointestinal:  Negative for blood in stool, constipation and diarrhea.  Endocrine: Negative for increased urination.  Genitourinary:  Negative for difficulty urinating.  Musculoskeletal:  Positive for joint pain, joint pain, joint swelling,  myalgias, muscle tenderness and myalgias. Negative for morning stiffness.  Skin:  Negative for color change, rash and redness.  Allergic/Immunologic: Negative for susceptible to infections.  Neurological:  Positive for dizziness and weakness. Negative for numbness, headaches and memory loss.  Hematological:  Negative for bruising/bleeding tendency.  Psychiatric/Behavioral:  Negative for confusion.    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) 11/30/2020   Status post total replacement of right hip 06/04/2019   Status post total replacement of left hip 03/19/2019   PTSD (post-traumatic stress disorder)    COPD (chronic obstructive pulmonary disease) ([facility])    Bipolar 1 disorder ([facility])    Anxiety    B12 deficiency 12/09/2018   Hyperglycemia 04/14/2017    Past Medical History:  Diagnosis Date   Anxiety    Arthritis    Avascular necrosis (H[facility])    Left hip   Avascular necrosis of bone of hip, 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   Bipolar 1 disorder (H[facility])    COPD (chronic obstructive pulmonary disease) ([facility])    Pneumonia    PTSD (post-traumatic stress disorder)     Family History  Problem Relation Age of Onset   CAD Mother 3   COPD Mother    Depression Mother    Heart attack Father    Colon polyps  Sister    Heart attack Maternal Grandmother    Early death Maternal Grandfather    Hearing loss Paternal Grandfather    Bipolar disorder Son    Asthma Daughter    Past Surgical History:  Procedure Laterality Date   bone spurs foot Right    CATARACT EXTRACTION Bilateral    COLONOSCOPY     HAND SURGERY Left    JOINT REPLACEMENT     TONSILLECTOMY     TONSILLECTOMY AND ADENOIDECTOMY     TOTAL HIP ARTHROPLASTY Left 03/19/2019   Procedure: LEFT TOTAL HIP ARTHROPLASTY ANTERIOR APPROACH;  Surgeon: [name], MD;  Location: [facility];  Service: Orthopedics;  Laterality: Left;   TOTAL HIP ARTHROPLASTY Right 06/04/2019   Procedure: RIGHT TOTAL HIP ARTHROPLASTY ANTERIOR APPROACH;  Surgeon: [name], MD;  Location: [facility];  Service: Orthopedics;  Laterality: Right;   Social History   Social History Narrative   Not on file   Immunization History  Administered Date(s) Administered   Influenza, High Dose Seasonal PF 08/25/2018   Moderna Sars-Covid-2 Vaccination 11/08/2019, 12/05/2019, 10/09/2020   Pneumococcal Conjugate-13 09/23/2018   Pneumococcal Polysaccharide-23 08/27/2016   Tdap 10/07/2012   Yellow Fever 07/05/2008     Objective: Vital Signs: BP (!) 146/93 (BP Location: Left Arm, Patient Position: Sitting, Cuff Size: Normal)    Pulse 93    Ht 5\' 10"  (1.778 m)    Wt 229 lb (103.9 kg)    BMI 32.86 kg/m    Physical Exam Vitals and nursing note reviewed.  Constitutional:      Appearance: She is well-developed.  HENT:     Head: Normocephalic and atraumatic.  Eyes:     Conjunctiva/sclera: Conjunctivae normal.  Cardiovascular:     Rate and Rhythm: Normal rate and regular rhythm.     Heart sounds: Normal heart sounds.  Pulmonary:     Effort: Pulmonary effort is normal.     Breath sounds: Normal breath sounds.  Abdominal:     General: Bowel sounds are normal.     Palpations: Abdomen is soft.  Musculoskeletal:     Cervical back: Normal range of motion.  Lymphadenopathy:     Cervical: No cervical adenopathy.  Skin:    General: Skin is warm and dry.     Capillary Refill: Capillary refill takes less than 2 seconds.  Neurological:     Mental Status: She is alert and oriented to person, place, and time.  Psychiatric:        Behavior: Behavior normal.     Musculoskeletal Exam: Patient remained seated during the examination.  C-spine has limited range of motion especially with lateral rotation.  Thoracic kyphosis was noted.  Midline spinal tenderness in the  lumbar region.  No SI joint tenderness upon palpation.  Shoulder joints have painful range of motion especially in the left shoulder.  Elbow joints and wrist joints have good range of motion with no tenderness or inflammation.  PIP and DIP thickening consistent with osteoarthritis of both hands noted.  No tenderness or synovitis over MCP joints.  Hip joints difficult to assess in seated position.  Painful range of motion of the left knee with warmth and swelling.  The right knee has good range of motion with no warmth or effusion.  Ankle joints have good range of motion with no joint tenderness.  No evidence of Achilles tendinitis.  CDAI Exam: CDAI Score: -- Patient Global: --; Provider Global: -- Swollen: --; Tender: -- Joint Exam 12/11/2021  No joint exam has been documented for this visit   There is currently no information documented on the homunculus. Go to the Rheumatology activity and complete the homunculus joint exam.  Investigation: No additional findings.  Imaging: No results found.  Recent Labs: Lab Results  Component Value Date   WBC 5.7 04/11/2021   HGB 13.2 04/11/2021   PLT 218 04/11/2021   NA 140 04/11/2021   K 4.1 04/11/2021   CL 107 04/11/2021   CO2 23 04/11/2021   GLUCOSE 168 (H) 04/11/2021   BUN 12 04/11/2021   CREATININE 1.19 (H) 04/11/2021   BILITOT 0.4 04/11/2021   ALKPHOS 144 (H) 10/30/2020   AST 13 04/11/2021   ALT 20 04/11/2021   PROT 6.5 04/11/2021   ALBUMIN 4.2 10/30/2020   CALCIUM 9.6 04/11/2021   GFRAA 54 (L) 04/11/2021   QFTBGOLDPLUS NEGATIVE 04/11/2021    Speciality Comments: Cimzia completed loading on June 22, 2020  Procedures:  No procedures performed Allergies: Celexa [citalopram hydrobromide] and Gabapentin   Assessment / Plan:     Visit Diagnoses: 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  She has limited range of motion of the C-spine especially with lateral  rotation.  Thoracic kyphosis was noted.  She has some midline spinal tenderness in the lumbar region.  No SI joint tenderness upon palpation.  She has no synovitis or dactylitis on examination today.  She has been off of Cimzia since September 2022 due to several eye surgeries.  She continues to have chronic pain and stiffness involving multiple joints.  She has not noticed any new or worsening symptoms being off of Cimzia.  She remains uncertain if Cimzia is providing any clinical benefit.  She is currently scheduled for a left knee arthroplasty on 12/28/2021.  She will require clearance by Dr. Magnus Ivan prior to scheduling to restart on Cimzia in the office.  She will follow-up in the office in 3 to 4 months to reassess her response once reinitiating therapy.  High risk medication use - Currently holding-Cimzia 400 mg sq injections every 4 weeks-Previously administered in the office. Completed loading for cimzia on 06/22/20. She declined self-injections.   CBC and CMP were drawn on 04/11/2021.  CBC and CMP were released today.  Patient requested to have the results forwarded to her PCP to review.  Once she restarts on Cimzia she will continue to require updated lab work every 3 months to monitor for drug toxicity.  TB Gold negative on 04/11/2021 and will continue to be monitored yearly.  A future order for TB gold was placed today.- Plan: CBC with Differential/Platelet, COMPLETE METABOLIC PANEL WITH GFR, QuantiFERON-TB Gold Plus Discussed the importance of holding Cimzia if she develops signs or symptoms of an infection and to resume once the infection is completely cleared.  She voiced understanding.  Screening for tuberculosis - Future order for TB gold placed today. Plan: QuantiFERON-TB Gold Plus  DDD (degenerative disc disease), lumbar: Chronic pain.  Spinal stenosis of lumbosacral region: She continues to have chronic pain in her lower back.  She has midline spinal tenderness in the lumbar  region.  Chronic SI joint pain: She has no SI joint tenderness upon palpation today.  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  Doing well.  She is not experiencing any groin pain currently.  Chronic pain of left knee: Chronic pain.  Patient had MRI of the left knee on 09/14/2021 which revealed  extensive full-thickness cartilage loss as well as a nondisplaced, nondepressed subchondral insufficiency fracture of the medial tibial plateau with surrounding bone marrow edema.  She also has a large radial tear of the posterior horn of the medial meniscus.  She has been under the care of Dr. Magnus IvanBlackman and is scheduled for a left knee replacement on 12/28/2021.  Discussed that she will require clearance by Dr. Magnus IvanBlackman prior to scheduling to restart on Cimzia.  Other medical conditions are listed as follows:   Bipolar 1 disorder ([facility])  PTSD (post-traumatic stress disorder)  Pulmonary nodules  History of COPD - Followed by Dr. Isaiah SergeMannam.  She has chronic shortness of breath.  B12 deficiency  History of anxiety  Former smoker  Orders: Orders Placed This Encounter  Procedures   CBC with Differential/Platelet   COMPLETE METABOLIC PANEL WITH GFR   QuantiFERON-TB Gold Plus   No orders of the defined types were placed in this encounter.   Follow-Up Instructions: Return in about 3 months (around 03/13/2022) for Ankylosing Spondylitis.   Judy Bienenstockaylor M Alleyah Twombly, PA-C  Note - This record has been created using Dragon software.  Chart creation errors have been sought, but may not always  have been located. Such creation errors do not reflect on  the standard of medical care.

## 2021-12-11 ENCOUNTER — Other Ambulatory Visit: Payer: Self-pay

## 2021-12-11 ENCOUNTER — Encounter: Payer: Self-pay | Admitting: Physician Assistant

## 2021-12-11 ENCOUNTER — Ambulatory Visit (INDEPENDENT_AMBULATORY_CARE_PROVIDER_SITE_OTHER): Payer: Medicare Other | Admitting: Physician Assistant

## 2021-12-11 VITALS — BP 146/93 | HR 93 | Ht 70.0 in | Wt 229.0 lb

## 2021-12-11 DIAGNOSIS — M4807 Spinal stenosis, lumbosacral region: Secondary | ICD-10-CM | POA: Diagnosis not present

## 2021-12-11 DIAGNOSIS — Z79899 Other long term (current) drug therapy: Secondary | ICD-10-CM | POA: Diagnosis not present

## 2021-12-11 DIAGNOSIS — M5136 Other intervertebral disc degeneration, lumbar region: Secondary | ICD-10-CM | POA: Diagnosis not present

## 2021-12-11 DIAGNOSIS — M25562 Pain in left knee: Secondary | ICD-10-CM

## 2021-12-11 DIAGNOSIS — Z96643 Presence of artificial hip joint, bilateral: Secondary | ICD-10-CM

## 2021-12-11 DIAGNOSIS — Z8659 Personal history of other mental and behavioral disorders: Secondary | ICD-10-CM

## 2021-12-11 DIAGNOSIS — Z8709 Personal history of other diseases of the respiratory system: Secondary | ICD-10-CM

## 2021-12-11 DIAGNOSIS — E538 Deficiency of other specified B group vitamins: Secondary | ICD-10-CM

## 2021-12-11 DIAGNOSIS — G8929 Other chronic pain: Secondary | ICD-10-CM

## 2021-12-11 DIAGNOSIS — Z87891 Personal history of nicotine dependence: Secondary | ICD-10-CM

## 2021-12-11 DIAGNOSIS — F4310 Post-traumatic stress disorder, unspecified: Secondary | ICD-10-CM

## 2021-12-11 DIAGNOSIS — R918 Other nonspecific abnormal finding of lung field: Secondary | ICD-10-CM

## 2021-12-11 DIAGNOSIS — M45 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: Secondary | ICD-10-CM

## 2021-12-11 DIAGNOSIS — M533 Sacrococcygeal disorders, not elsewhere classified: Secondary | ICD-10-CM

## 2021-12-11 DIAGNOSIS — Z111 Encounter for screening for respiratory tuberculosis: Secondary | ICD-10-CM

## 2021-12-11 DIAGNOSIS — F319 Bipolar disorder, unspecified: Secondary | ICD-10-CM

## 2021-12-11 LAB — COMPLETE METABOLIC PANEL WITH GFR
AG Ratio: 1.4 (calc) (ref 1.0–2.5)
ALT: 24 U/L (ref 6–29)
AST: 14 U/L (ref 10–35)
Albumin: 3.9 g/dL (ref 3.6–5.1)
Alkaline phosphatase (APISO): 159 U/L — ABNORMAL HIGH (ref 37–153)
BUN/Creatinine Ratio: 8 (calc) (ref 6–22)
BUN: 11 mg/dL (ref 7–25)
CO2: 22 mmol/L (ref 20–32)
Calcium: 9.6 mg/dL (ref 8.6–10.4)
Chloride: 105 mmol/L (ref 98–110)
Creat: 1.31 mg/dL — ABNORMAL HIGH (ref 0.50–1.05)
Globulin: 2.8 g/dL (calc) (ref 1.9–3.7)
Glucose, Bld: 161 mg/dL — ABNORMAL HIGH (ref 65–99)
Potassium: 4.2 mmol/L (ref 3.5–5.3)
Sodium: 137 mmol/L (ref 135–146)
Total Bilirubin: 0.3 mg/dL (ref 0.2–1.2)
Total Protein: 6.7 g/dL (ref 6.1–8.1)
eGFR: 44 mL/min/{1.73_m2} — ABNORMAL LOW (ref >=60)

## 2021-12-11 LAB — CBC WITH DIFFERENTIAL/PLATELET
Absolute Monocytes: 380 cells/uL (ref 200–950)
Basophils Absolute: 20 cells/uL (ref 0–200)
Basophils Relative: 0.5 %
Eosinophils Absolute: 180 cells/uL (ref 15–500)
Eosinophils Relative: 4.5 %
HCT: 40.3 % (ref 35.0–45.0)
Hemoglobin: 13 g/dL (ref 11.7–15.5)
Lymphs Abs: 1504 cells/uL (ref 850–3900)
MCH: 28.9 pg (ref 27.0–33.0)
MCHC: 32.3 g/dL (ref 32.0–36.0)
MCV: 89.6 fL (ref 80.0–100.0)
MPV: 10.1 fL (ref 7.5–12.5)
Monocytes Relative: 9.5 %
Neutro Abs: 1916 cells/uL (ref 1500–7800)
Neutrophils Relative %: 47.9 %
Platelets: 209 10*3/uL (ref 140–400)
RBC: 4.5 10*6/uL (ref 3.80–5.10)
RDW: 12.6 % (ref 11.0–15.0)
Total Lymphocyte: 37.6 %
WBC: 4 10*3/uL (ref 3.8–10.8)

## 2021-12-12 ENCOUNTER — Ambulatory Visit: Payer: Medicare Other | Admitting: Physician Assistant

## 2021-12-12 DIAGNOSIS — M4807 Spinal stenosis, lumbosacral region: Secondary | ICD-10-CM

## 2021-12-12 DIAGNOSIS — M5136 Other intervertebral disc degeneration, lumbar region: Secondary | ICD-10-CM

## 2021-12-12 DIAGNOSIS — Z96643 Presence of artificial hip joint, bilateral: Secondary | ICD-10-CM

## 2021-12-12 DIAGNOSIS — G8929 Other chronic pain: Secondary | ICD-10-CM

## 2021-12-12 DIAGNOSIS — E538 Deficiency of other specified B group vitamins: Secondary | ICD-10-CM

## 2021-12-12 DIAGNOSIS — Z79899 Other long term (current) drug therapy: Secondary | ICD-10-CM

## 2021-12-12 DIAGNOSIS — Z87891 Personal history of nicotine dependence: Secondary | ICD-10-CM

## 2021-12-12 DIAGNOSIS — F4310 Post-traumatic stress disorder, unspecified: Secondary | ICD-10-CM

## 2021-12-12 DIAGNOSIS — F319 Bipolar disorder, unspecified: Secondary | ICD-10-CM

## 2021-12-12 DIAGNOSIS — M45 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: Secondary | ICD-10-CM

## 2021-12-12 DIAGNOSIS — Z8659 Personal history of other mental and behavioral disorders: Secondary | ICD-10-CM

## 2021-12-12 DIAGNOSIS — R918 Other nonspecific abnormal finding of lung field: Secondary | ICD-10-CM

## 2021-12-12 DIAGNOSIS — Z8709 Personal history of other diseases of the respiratory system: Secondary | ICD-10-CM

## 2021-12-12 NOTE — Progress Notes (Signed)
CBC WNL.  ?Alk phos remains elevated but is slightly lower.   ?Creatinine is elevated-1.31 and has trended up.  GFR is low-44.  Glucose is elevated-161.  ?Please clarify if she has been taking any NSAIDs? ? She should avoid the use of NSAIDs.   ?Please forward results to her PCP.

## 2021-12-17 ENCOUNTER — Encounter: Payer: Self-pay | Admitting: Pulmonary Disease

## 2021-12-17 ENCOUNTER — Other Ambulatory Visit: Payer: Self-pay

## 2021-12-17 ENCOUNTER — Ambulatory Visit (INDEPENDENT_AMBULATORY_CARE_PROVIDER_SITE_OTHER): Payer: Medicare Other | Admitting: Pulmonary Disease

## 2021-12-17 VITALS — BP 130/70 | HR 102 | Temp 98.3°F | Ht 70.0 in | Wt 225.0 lb

## 2021-12-17 DIAGNOSIS — J441 Chronic obstructive pulmonary disease with (acute) exacerbation: Secondary | ICD-10-CM

## 2021-12-17 DIAGNOSIS — Z87891 Personal history of nicotine dependence: Secondary | ICD-10-CM | POA: Diagnosis not present

## 2021-12-17 MED ORDER — PREDNISONE 10 MG PO TABS: ORAL_TABLET | ORAL | 0 refills | Status: DC

## 2021-12-17 MED ORDER — TRELEGY ELLIPTA 200-62.5-25 MCG/ACT IN AEPB: 1.0000 | INHALATION_SPRAY | Freq: Every day | RESPIRATORY_TRACT | 0 refills | Status: DC

## 2021-12-17 MED ORDER — METHYLPREDNISOLONE ACETATE 80 MG/ML IJ SUSP
120.0000 mg | Freq: Once | INTRAMUSCULAR | Status: AC
  Administered 2021-12-17: 120 mg via INTRAMUSCULAR

## 2021-12-17 NOTE — Progress Notes (Signed)
? ?      ?Judy [name]    [id number]    27-Jun-1951 ? ?Primary Care Physician:Koberlein, Paris Lore, MD ? ?Referring Physician: Wynn Banker, MD ?[id number] ?[place],  [place] [address] ? ?Chief complaint: Follow up for COPD ? ?HPI: ?71 year old with anxiety, PTSD, bipolar, COPD, asthma ? ?Complains of dyspnea on exertion for the past 1 year.  She has symptoms on exertion, mild symptoms at rest, daily cough with clear mucus, sinus drainage.  She has had multiple hospitalizations over the past year for COPD exacerbations.  Given inhalers including Advair and Symbicort but does not feel it is helping.  Previously evaluated by pulmonary at [facility] and is here for second opinion. ? ?Hospitalized in July 2018 for COPD exacerbation ?Hospitalized in November 2018 for COPD exacerbation, CT during both admissions were is negative for PE but showed bilateral groundglass opacities.  She was treated with antibiotics, oral prednisone.  Virus panel was positive for adenovirus at that time. ? 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 ?Smoking history: 50-pack-year smoker.  Quit smoking in early 2022 ?Travel history: Originally  from [place].  No significant recent travel ?Relevant family history: Daughter has asthma.  No other significant family history of lung disease. ? ?Interim history: ?She ran out of inhalers 3 weeks ago since she could not afford it through her insurance ?Complains of increasing dyspnea, wheezing.  Denies any cough, sputum production ? ?She also could not afford her psychiatric medications but self paid for Seroquel as her mood disorders were sent ? ?Due for left knee replacement on 3/24 by Dr. Magnus Ivan ? ?Outpatient Encounter Medications as of 12/17/2021  ?Medication Sig  ? QUEtiapine (SEROQUEL) 400 MG tablet Take 400 mg by mouth 2 (two)  times daily.  ? traMADol (ULTRAM) 50 MG tablet Take 1-2 tablets (50-100 mg total) by mouth 3 (three) times daily as needed.  ? albuterol (VENTOLIN HFA) 108 (90 Base) MCG/ACT inhaler Inhale 1-2 puffs into the lungs every 6 (six) hours as needed for wheezing or shortness of breath. (Patient not taking: Reported on 12/17/2021)  ? atorvastatin (LIPITOR) 40 MG tablet Take 1 tablet by mouth once daily (Patient not taking: Reported on 12/11/2021)  ? Budeson-Glycopyrrol-Formoterol (BREZTRI AEROSPHERE) 160-9-4.8 MCG/ACT AERO Inhale 2 puffs into the lungs 2 (two) times daily. (Patient not taking: Reported on 12/11/2021)  ? Cholecalciferol (VITAMIN D3 PO) Take 1 tablet by mouth in the morning. (Patient not taking: Reported on 12/17/2021)  ? Cyanocobalamin (VITAMIN B-12 PO) Take 1 tablet by mouth in the morning. (Patient not taking: Reported on 12/17/2021)  ? diphenhydrAMINE (BENADRYL) 25 MG tablet Take 25 mg by mouth at bedtime. (Patient not taking: Reported on 12/17/2021)  ? LORazepam (ATIVAN) 1 MG tablet Take 1 mg by mouth in the morning, at noon, and at bedtime. (Patient not taking: Reported on 12/17/2021)  ? Spacer/Aero-Holding Chambers DEVI 1 Device by Does not apply route daily as needed. (Patient not taking: Reported on 12/14/2021)  ? ?No facility-administered encounter medications on file as of 12/17/2021.  ? ?Physical Exam: ?Blood pressure 130/70, pulse (!) 102, temperature 98.3 ?F (36.8 ?C), temperature source Oral, height 5\' 10"  (1.778 m), weight 225 lb (102.1 kg), SpO2 97 %. ?Gen:      No acute distress ?HEENT:  EOMI, sclera anicteric ?Neck:     No masses;  no thyromegaly ?Lungs:    Bilateral expiratory wheeze ?CV:         Regular rate and rhythm; no murmurs ?Abd:      + bowel sounds; soft, non-tender; no palpable masses, no distension ?Ext:    No edema; adequate peripheral perfusion ?Skin:      Warm and dry; no rash ?Neuro: alert and oriented x 3 ?Psych: normal mood and affect  ? ?Data Reviewed: ?Imaging: ?CTA 04/15/2017-no  pulmonary embolism, patchy upper lobe groundglass opacities ?CTA 08/23/2018-no PE, patchy bilateral groundglass opacities, predominantly in the bases. ?High-resolution CT 08/14/2018-no ILD, bronchial wall thickening with atherosclerosis, small pulmonary nodules ?Screening CT chest 11/01/2020-mild emphysema, stable pulmonary nodules. ?Chest x-ray 04/25/2021-no acute infiltrate. ?I have reviewed the images personally. ? ?PFTs: ?Spirometry 05/18/18 ?FEV1 1.76, F/F 65.  Moderate obstruction. ? ?08/10/18 ?FVC 3.31 [94%), FEV1 2.25 [83%), F/F 68, TLC 83%, DLCO 61% ?Mild obstruction with moderate diffusion defect. ? ?FENO 08/05/2018-8 ? ?Labs: ?CBC 08/25/2017-WBC 9.6, eos 0% ?CBC 08/05/2018- WBC 9.1, eos 0.6%, absolute eosinophil count 55 ? ?IgE 08/10/2018-79 ?Alpha-1 antitrypsin 08/10/2018-192, PI MM ? ?Assessment:  ?COPD ?Acute visit for exacerbation ?PFTs reviewed with mild obstruction, no bronchodilator response.  Labs show normal IgE and low peripheral eosinophils. ?She has a diagnosis of asthma but this looks more like COPD. ? ?She was on anoro but had now off all inhalers due to affordability issues ?Has acute exacerbation now.  We will treat with Depo-Medrol injection today and prednisone taper starting at 40 mg.  She will taper dose to 20 mg and hold at that dose until she can be reevaluated in the clinic next week ?Give samples of inhaler ?Work with pharmacy to see if she can get on some kind of patient assistance ? ?Left knee replacement ?She is due for knee replacement on 3/24.  Hopefully we can get her breathing better in time for procedure ?Reassess at return visit ? ?Abnormal CT, nodules ?Prior CTs noted with patchy bilateral groundglass opacities.  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 ? ?Follow-up high res CT does not show interstitial lung disease. ?Continue annual low-dose screening CT chest ? ?Health  maintenance ?08/27/2016-Pneumovax ? ? ?Plan/Recommendations: ?-Inhaler samples, prednisone ?-Pharmacy referral for patient assistance ? ?Follow-up in 1 week ? ?Chilton Greathouse MD ?[facility] ?12/17/2021, 1:51 PM ? ?CC: Wynn Banker, MD ? ? ?

## 2021-12-17 NOTE — Patient Instructions (Signed)
We will give her Depo injection of 125 ?Start prednisone at 40 mg.  Reduce by 10 mg every 3 days until you reach a dose of 20 mg ?Hold at that dose until you are reevaluated in the clinic ?We will give you a Trelegy sample and I will work with pharmacy to see what inhalers are covered by insurance ?Follow-up in 1 week ?

## 2021-12-18 NOTE — Patient Instructions (Addendum)
DUE TO COVID-19 ONLY ONE VISITOR  (aged 70 and older)  IS ALLOWED TO COME WITH YOU AND STAY IN THE WAITING ROOM ONLY DURING PRE OP AND PROCEDURE.   ?**NO VISITORS ARE ALLOWED IN THE SHORT STAY AREA OR RECOVERY ROOM!!** ? ?IF YOU WILL BE ADMITTED INTO THE HOSPITAL YOU ARE ALLOWED ONLY TWO SUPPORT PEOPLE DURING VISITATION HOURS ONLY (7 AM -8PM)   ?The support person(s) must pass our screening, gel in and out, and wear a mask at all times, including in the patient?s room. ?Patients must also wear a mask when staff or their support person are in the room. ?Visitors GUEST BADGE MUST BE WORN VISIBLY  ?One adult visitor may remain with you overnight and MUST be in the room by 8 P.M. ?      ? Your procedure is scheduled on: 12/28/21 ? ? [facility] ? ?  Report to admitting at : 10:45 AM ? ? Call this number if you have problems the morning of surgery [phone] ? ? Do not eat food :After Midnight. ? ? After Midnight you may have the following liquids until : 10:30 AM DAY OF SURGERY ? ?Water ?Black Coffee (sugar ok, NO MILK/CREAM OR CREAMERS)  ?Tea (sugar ok, NO MILK/CREAM OR CREAMERS) regular and decaf                             ?Plain Jell-O (NO RED)                                           ?Fruit ices (not with fruit pulp, NO RED)                                     ?Popsicles (NO RED)                                                                  ?Juice: apple, WHITE grape, WHITE cranberry ?Sports drinks like Gatorade (NO RED) ?Clear broth(vegetable,chicken,beef) ?  ?Oral Hygiene is also important to reduce your risk of infection.                                    ?Remember - BRUSH YOUR TEETH THE MORNING OF SURGERY WITH YOUR REGULAR TOOTHPASTE ? ? Do NOT smoke after Midnight ? ? Take these medicines the morning of surgery with A SIP OF WATER: seroquel,prednisone. ? ?DO NOT TAKE ANY ORAL DIABETIC MEDICATIONS DAY OF YOUR SURGERY ? ?Bring CPAP mask and tubing day of surgery. ?                   ?           You may not have any metal on your body including hair pins, jewelry, and body piercing ? ?           Do not wear make-up, lotions, powders, perfumes/cologne, or deodorant ? ?Do not wear  nail polish including gel and S&S, artificial/acrylic nails, or any other type of covering on natural nails including finger and toenails. If you have artificial nails, gel coating, etc. that needs to be removed by a nail salon please have this removed prior to surgery or surgery may need to be canceled/ delayed if the surgeon/ anesthesia feels like they are unable to be safely monitored.  ? ?Do not shave  48 hours prior to surgery.  ? ? Do not bring valuables to the hospital. [facility] IS NOT ?            RESPONSIBLE   FOR VALUABLES. ? ? Contacts, dentures or bridgework may not be worn into surgery. ? ? Bring small overnight bag day of surgery. ?  ? Patients discharged on the day of surgery will not be allowed to drive home.  Someone NEEDS to stay with you for the first 24 hours after anesthesia. ? ? Special Instructions: Bring a copy of your healthcare power of attorney and living will documents         the day of surgery if you haven't scanned them before. ? ?            Please read over the following fact sheets you were given: IF YOU HAVE QUESTIONS ABOUT YOUR PRE-OP INSTRUCTIONS PLEASE CALL [phone] ? ?   [facility] - Preparing for Surgery ?Before surgery, you can play an important role.  Because skin is not sterile, your skin needs to be as free of germs as possible.  You can reduce the number of germs on your skin by washing with CHG (chlorahexidine gluconate) soap before surgery.  CHG is an antiseptic cleaner which kills germs and bonds with the skin to continue killing germs even after washing. ?Please DO NOT use if you have an allergy to CHG or antibacterial soaps.  If your skin becomes reddened/irritated stop using the CHG and inform your nurse when you arrive at Short Stay. ?Do not shave  (including legs and underarms) for at least 48 hours prior to the first CHG shower.  You may shave your face/neck. ?Please follow these instructions carefully: ? 1.  Shower with CHG Soap the night before surgery and the  morning of Surgery. ? 2.  If you choose to wash your hair, wash your hair first as usual with your  normal  shampoo. ? 3.  After you shampoo, rinse your hair and body thoroughly to remove the  shampoo.                           4.  Use CHG as you would any other liquid soap.  You can apply chg directly  to the skin and wash  ?                     Gently with a scrungie or clean washcloth. ? 5.  Apply the CHG Soap to your body ONLY FROM THE NECK DOWN.   Do not use on face/ open      ?                     Wound or open sores. Avoid contact with eyes, ears mouth and genitals (private parts).  ?                     Engineering geologist,  Genitals (private parts) with your normal soap. ?  6.  Wash thoroughly, paying special attention to the area where your surgery  will be performed. ? 7.  Thoroughly rinse your body with warm water from the neck down. ? 8.  DO NOT shower/wash with your normal soap after using and rinsing off  the CHG Soap. ?               9.  Pat yourself dry with a clean towel. ?           10.  Wear clean pajamas. ?           11.  Place clean sheets on your bed the night of your first shower and do not  sleep with pets. ?Day of Surgery : ?Do not apply any lotions/deodorants the morning of surgery.  Please wear clean clothes to the hospital/surgery center. ? ?FAILURE TO FOLLOW THESE INSTRUCTIONS MAY RESULT IN THE CANCELLATION OF YOUR SURGERY ?PATIENT SIGNATURE_________________________________ ? ?NURSE SIGNATURE__________________________________ ? ?________________________________________________________________________  ? ?Incentive Spirometer ? ?An incentive spirometer is a tool that can help keep your lungs clear and active. This tool measures how well you are filling your lungs with  each breath. Taking long deep breaths may help reverse or decrease the chance of developing breathing (pulmonary) problems (especially infection) following: ?A long period of time when you are unable to move or be active. ?BEFORE THE PROCEDURE  ?If the spirometer includes an indicator to show your best effort, your nurse or respiratory therapist will set it to a desired goal. ?If possible, sit up straight or lean slightly forward. Try not to slouch. ?Hold the incentive spirometer in an upright position. ?INSTRUCTIONS FOR USE  ?Sit on the edge of your bed if possible, or sit up as far as you can in bed or on a chair. ?Hold the incentive spirometer in an upright position. ?Breathe out normally. ?Place the mouthpiece in your mouth and seal your lips tightly around it. ?Breathe in slowly and as deeply as possible, raising the piston or the ball toward the top of the column. ?Hold your breath for 3-5 seconds or for as long as possible. Allow the piston or ball to fall to the bottom of the column. ?Remove the mouthpiece from your mouth and breathe out normally. ?Rest for a few seconds and repeat Steps 1 through 7 at least 10 times every 1-2 hours when you are awake. Take your time and take a few normal breaths between deep breaths. ?The spirometer may include an indicator to show your best effort. Use the indicator as a goal to work toward during each repetition. ?After each set of 10 deep breaths, practice coughing to be sure your lungs are clear. If you have an incision (the cut made at the time of surgery), support your incision when coughing by placing a pillow or rolled up towels firmly against it. ?Once you are able to get out of bed, walk around indoors and cough well. You may stop using the incentive spirometer when instructed by your caregiver.  ?RISKS AND COMPLICATIONS ?Take your time so you do not get dizzy or light-headed. ?If you are in pain, you may need to take or ask for pain medication before doing  incentive spirometry. It is harder to take a deep breath if you are having pain. ?AFTER USE ?Rest and breathe slowly and easily. ?It can be helpful to keep track of a log of your progress. Your caregiver can provi

## 2021-12-19 ENCOUNTER — Encounter (HOSPITAL_COMMUNITY)
Admission: RE | Admit: 2021-12-19 | Discharge: 2021-12-19 | Disposition: A | Discharge status: Home / Self Care | Payer: Medicare Other | Source: Ambulatory Visit | Attending: Orthopaedic Surgery | Admitting: Orthopaedic Surgery

## 2021-12-19 ENCOUNTER — Encounter (HOSPITAL_COMMUNITY): Payer: Self-pay

## 2021-12-19 ENCOUNTER — Other Ambulatory Visit: Payer: Self-pay

## 2021-12-19 VITALS — BP 117/77 | HR 72 | Temp 97.9°F | Resp 18 | Ht 70.0 in | Wt 227.1 lb

## 2021-12-19 DIAGNOSIS — Z01812 Encounter for preprocedural laboratory examination: Secondary | ICD-10-CM | POA: Insufficient documentation

## 2021-12-19 DIAGNOSIS — Z01818 Encounter for other preprocedural examination: Secondary | ICD-10-CM

## 2021-12-19 HISTORY — DX: Dyspnea, unspecified: R06.00

## 2021-12-19 HISTORY — DX: Depression, unspecified: F32.A

## 2021-12-19 LAB — SURGICAL PCR SCREEN
MRSA, PCR: NEGATIVE
Staphylococcus aureus: NEGATIVE

## 2021-12-19 NOTE — Progress Notes (Signed)
For Short Stay: ?[facility] appointment date:N/A ?Date of COVID positive in last 90 days: N/A ?COVID Vaccine: Moderna x 3: 10/09/20 ?Bowel Prep reminder: ? ? ?For Anesthesia: ?PCP - Dr. Buel Ream ?Cardiologist -  ? ?Chest x-ray - 04/26/21 ?EKG -  ?Stress Test -  ?ECHO - 10/08/17 ?Cardiac Cath -  ?Pacemaker/ICD device last checked: ?Pacemaker orders received: ?Device Rep notified: ? ?Spinal Cord Stimulator: ? ?Sleep Study -  ?CPAP -  ? ?Fasting Blood Sugar -  ?Checks Blood Sugar _____ times a day ?Date and result of last Hgb A1c- ? ?Blood Thinner Instructions: ?Aspirin Instructions: ?Last Dose: ? ?Activity level: Can go up a flight of stairs and activities of daily living without stopping and without chest pain and/or shortness of breath ?  Able to exercise without chest pain and/or shortness of breath ?  Unable to go up a flight of stairs without chest pain and/or shortness of breath ?   ? ?Anesthesia review: Hx: COPD.PTSD ? ?Patient denies shortness of breath, fever, cough and chest pain at PAT appointment ? ? ?Patient verbalized understanding of instructions that were given to them at the PAT appointment. Patient was also instructed that they will need to review over the PAT instructions again at home before surgery.  ?

## 2021-12-20 ENCOUNTER — Other Ambulatory Visit: Payer: Self-pay | Admitting: Physician Assistant

## 2021-12-21 ENCOUNTER — Telehealth: Payer: Self-pay | Admitting: Acute Care

## 2021-12-21 NOTE — Telephone Encounter (Signed)
Left voicemail and call back information to call and schedule annual LDCT ?

## 2021-12-25 NOTE — Progress Notes (Signed)
Pt. Call to clarify time of arrival the day of surgery.Pt. Was informed to arrive at: 10:45 AM. ?

## 2021-12-26 ENCOUNTER — Other Ambulatory Visit (HOSPITAL_COMMUNITY): Payer: Self-pay

## 2021-12-26 ENCOUNTER — Encounter: Payer: Self-pay | Admitting: Pulmonary Disease

## 2021-12-26 ENCOUNTER — Ambulatory Visit (INDEPENDENT_AMBULATORY_CARE_PROVIDER_SITE_OTHER): Payer: Medicare Other | Admitting: Pulmonary Disease

## 2021-12-26 ENCOUNTER — Other Ambulatory Visit: Payer: Self-pay

## 2021-12-26 VITALS — BP 122/70 | HR 72 | Temp 98.6°F | Ht 70.0 in | Wt 230.0 lb

## 2021-12-26 DIAGNOSIS — J449 Chronic obstructive pulmonary disease, unspecified: Secondary | ICD-10-CM

## 2021-12-26 DIAGNOSIS — Z87891 Personal history of nicotine dependence: Secondary | ICD-10-CM

## 2021-12-26 MED ORDER — TRELEGY ELLIPTA 200-62.5-25 MCG/ACT IN AEPB: 1.0000 | INHALATION_SPRAY | Freq: Every day | RESPIRATORY_TRACT | 0 refills | Status: DC

## 2021-12-26 NOTE — Patient Instructions (Signed)
We will give another sample of Trelegy inhaler ?Continue prednisone at 20 mg a day ?Check with pharmacy to see if he can get on any alternate medication of patient assistance ? ?Follow-up in clinic in 3 months ?

## 2021-12-26 NOTE — Progress Notes (Signed)
? ?      ?Judy [name]    [id number]    [birth year] ? ?Primary Care Physician:Koberlein, Paris Lore, MD ? ?Referring Physician: Wynn Banker, MD ?[phone] ?[place],  [place] [address] ? ?Chief complaint: Follow up for COPD ? ?HPI: ?71 year old with anxiety, PTSD, bipolar, COPD, asthma ? ?Complains of dyspnea on exertion for the past 1 year.  She has symptoms on exertion, mild symptoms at rest, daily cough with clear mucus, sinus drainage.  She has had multiple hospitalizations over the past year for COPD exacerbations.  Given inhalers including Advair and Symbicort but does not feel it is helping.  Previously evaluated by pulmonary at [facility] and is here for second opinion. ? ?Hospitalized in July 2018 for COPD exacerbation ?Hospitalized in November 2018 for COPD exacerbation, CT during both admissions were is negative for PE but showed bilateral groundglass opacities.  She was treated with antibiotics, oral prednisone.  Virus panel was positive for adenovirus at that time. ? 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 ?Smoking history: 50-pack-year smoker.  Quit smoking in early 2022 ?Travel history: Originally  from [place].  No significant recent travel ?Relevant family history: Daughter has asthma.  No other significant family history of lung disease. ? ?Interim history: ?She was seen last week for acute exacerbation after she ran out of inhalers since she could not afford it through her insurance. ? ?Given Trelegy samples and prednisone.  She continues on prednisone 40 mg a day ? ?Complains of increasing dyspnea, wheezing.  Denies any cough, sputum production ? ?She also could not afford her psychiatric medications but self paid for Seroquel as her mood disorders were sent ? ?Due for left knee replacement on 3/24 by Dr. Magnus Ivan ? ?Outpatient  Encounter Medications as of 12/26/2021  ?Medication Sig  ? Cholecalciferol (VITAMIN D3 PO) Take 1 tablet by mouth in the morning.  ? Cyanocobalamin (VITAMIN B-12 PO) Take 1 tablet by mouth in the morning.  ? Fluticasone-Umeclidin-Vilant (TRELEGY ELLIPTA) 200-62.5-25 MCG/ACT AEPB Inhale 1 puff into the lungs daily.  ? predniSONE (DELTASONE) 10 MG tablet Start 40mg  daily then reduce 10mg  every 3 days until 20mg  daily  ? QUEtiapine (SEROQUEL) 400 MG tablet Take 400 mg by mouth 2 (two) times daily.  ? albuterol (VENTOLIN HFA) 108 (90 Base) MCG/ACT inhaler Inhale 1-2 puffs into the lungs every 6 (six) hours as needed for wheezing or shortness of breath. (Patient not taking: Reported on 12/17/2021)  ? atorvastatin (LIPITOR) 40 MG tablet Take 1 tablet by mouth once daily (Patient not taking: Reported on 12/11/2021)  ? diphenhydrAMINE (BENADRYL) 25 MG tablet Take 25 mg by mouth at bedtime. (Patient not taking: Reported on 12/17/2021)  ? Spacer/Aero-Holding Chambers DEVI 1 Device by Does not apply route daily as needed. (Patient not taking: Reported on 12/14/2021)  ? [DISCONTINUED] Budeson-Glycopyrrol-Formoterol (BREZTRI AEROSPHERE) 160-9-4.8 MCG/ACT AERO Inhale 2 puffs into the lungs 2 (two) times daily. (Patient not taking: Reported on 12/11/2021)  ? [DISCONTINUED] LORazepam (ATIVAN) 1 MG tablet Take 1 mg by mouth in the morning, at noon, and at bedtime. (Patient not taking: Reported on 12/17/2021)  ? [DISCONTINUED] traMADol (ULTRAM) 50 MG tablet Take 1-2 tablets (50-100 mg total) by mouth 3 (three) times daily as needed.  ? ?No facility-administered encounter medications on file as of 12/26/2021.  ? ?Physical Exam: ?Blood  pressure 130/70, pulse (!) 102, temperature 98.3 ?F (36.8 ?C), temperature source Oral, height 5\' 10"  (1.778 m), weight 225 lb (102.1 kg), SpO2 97 %. ?Gen:      No acute distress ?HEENT:  EOMI, sclera anicteric ?Neck:     No masses; no thyromegaly ?Lungs:    Bilateral expiratory wheeze ?CV:         Regular rate  and rhythm; no murmurs ?Abd:      + bowel sounds; soft, non-tender; no palpable masses, no distension ?Ext:    No edema; adequate peripheral perfusion ?Skin:      Warm and dry; no rash ?Neuro: alert and oriented x 3 ?Psych: normal mood and affect  ? ?Data Reviewed: ?Imaging: ?CTA 04/15/2017-no pulmonary embolism, patchy upper lobe groundglass opacities ?CTA 08/23/2018-no PE, patchy bilateral groundglass opacities, predominantly in the bases. ?High-resolution CT 08/14/2018-no ILD, bronchial wall thickening with atherosclerosis, small pulmonary nodules ?Screening CT chest 11/01/2020-mild emphysema, stable pulmonary nodules. ?Chest x-ray 04/25/2021-no acute infiltrate. ?I have reviewed the images personally. ? ?PFTs: ?Spirometry 05/18/18 ?FEV1 1.76, F/F 65.  Moderate obstruction. ? ?08/10/18 ?FVC 3.31 [94%), FEV1 2.25 [83%), F/F 68, TLC 83%, DLCO 61% ?Mild obstruction with moderate diffusion defect. ? ?FENO 08/05/2018-8 ? ?Labs: ?CBC 08/25/2017-WBC 9.6, eos 0% ?CBC 08/05/2018- WBC 9.1, eos 0.6%, absolute eosinophil count 55 ? ?IgE 08/10/2018-79 ?Alpha-1 antitrypsin 08/10/2018-192, PI MM ? ?Assessment:  ?COPD ?Acute visit for exacerbation ?PFTs reviewed with mild obstruction, no bronchodilator response.  Labs show normal IgE and low peripheral eosinophils. ?She has a diagnosis of asthma but this looks more like COPD. ? ?Breathing is better after prednisone burst last week.  Continue prednisone at 20 mg a day until she can get her knee replacement to keep her breathing under control ?She is on Trelegy inhaler and will give her another sample ?Follow-up with pharmacy to see if she can can get on some kind of patient assistance ? ?Left knee replacement ?She is due for knee replacement on 3/24.  Overall at increased risk given COPD with recent exacerbation.  Breathing is under better control now ? ?Peri-operative Assessment of Pulmonary Risk for Non-Thoracic Surgery: ? ?Judy [name], risk of perioperative pulmonary complications  is increased by: ? Age greater than 65 years ? COPD ? ? ?Respiratory complications generally occur in 1% of ASA Class I patients, 5% of ASA Class II and 10% of ASA Class III-IV patients These complications rarely result in mortality and iclude postoperative pneumonia, atelectasis, pulmonary embolism, ARDS and increased time requiring postoperative mechanical ventilation. ? ?Overall, I recommend proceeding with the surgery if the risk for respiratory complications are outweighed by the potential benefits. This will need to be discussed between the patient and surgeon. ? ?To reduce risks of respiratory complications, I recommend: ?--Pre- and post-operative incentive spirometry performed frequently while awake ?--Avoiding use of pancuronium during anesthesia. ? ?I have discussed the risk factors and recommendations above with the patient.  ? ? ?Abnormal CT, nodules ?Prior CTs noted with patchy bilateral groundglass opacities.  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 ? ?Follow-up high res CT does not show interstitial lung disease. ?Continue annual low-dose screening CT chest ? ?Health maintenance ?08/27/2016-Pneumovax ? ? ?Plan/Recommendations: ?- Inhaler samples, prednisone ?-Pharmacy referral for patient assistance ? ?Follow-up in 3 months. ? ?08/29/2016 MD ?[facility] ?12/26/2021, 4:03 PM ? ?CC: 12/28/2021, MD ? ? ?

## 2021-12-27 ENCOUNTER — Encounter: Payer: Self-pay | Admitting: Pulmonary Disease

## 2021-12-27 ENCOUNTER — Other Ambulatory Visit (HOSPITAL_COMMUNITY): Payer: Self-pay

## 2021-12-27 NOTE — H&P (Signed)
TOTAL KNEE ADMISSION H&P ? ?Patient is being admitted for left total knee arthroplasty. ? ?Subjective: ? ?Chief Complaint:left knee pain. ? ?HPI: Judy [name], 71 y.o. female, has a history of pain and functional disability in the left knee due to arthritis and has failed non-surgical conservative treatments for greater than 12 weeks to includeNSAID's and/or analgesics, corticosteriod injections, flexibility and strengthening excercises, and activity modification.  Onset of symptoms was gradual, starting 1 years ago with gradually worsening course since that time. The patient noted no past surgery on the left knee(s).  Patient currently rates pain in the left knee(s) at 10 out of 10 with activity. Patient has night pain, worsening of pain with activity and weight bearing, pain that interferes with activities of daily living, pain with passive range of motion, crepitus, and joint swelling.  Patient has evidence of subchondral sclerosis, periarticular osteophytes, and joint space narrowing by imaging studies. There is no active infection. ? ?Patient Active Problem List  ? Diagnosis Date Noted  ? Primary osteoarthritis of left knee 10/25/2021  ? 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 ([place]) 11/30/2020  ? Status post total replacement of right hip 06/04/2019  ? Status post total replacement of left hip 03/19/2019  ? PTSD (post-traumatic stress disorder)   ? COPD (chronic obstructive pulmonary disease) ([place])   ? Bipolar 1 disorder ([place])   ? Anxiety   ? B12 deficiency 12/09/2018  ? Hyperglycemia 04/14/2017  ? ?Past Medical History:  ?Diagnosis Date  ? Anxiety   ? Arthritis   ? Avascular necrosis ([place])   ? Left hip  ? Avascular necrosis of bone of hip, left ([place]) 12/28/2018  ? Avascular necrosis of bone of right hip ([place]) 04/28/2019  ? Bipolar 1 disorder ([place])   ? COPD (chronic obstructive pulmonary disease) ([place])   ? Depression   ? Dyspnea   ? Pneumonia   ? PTSD (post-traumatic stress disorder)   ?  ?Past  Surgical History:  ?Procedure Laterality Date  ? bone spurs foot Right   ? CATARACT EXTRACTION Bilateral   ? COLONOSCOPY    ? HAND SURGERY Left   ? JOINT REPLACEMENT    ? TONSILLECTOMY    ? TONSILLECTOMY AND ADENOIDECTOMY    ? TOTAL HIP ARTHROPLASTY Left 03/19/2019  ? Procedure: LEFT TOTAL HIP ARTHROPLASTY ANTERIOR APPROACH;  Surgeon: Mcarthur Rossetti, MD;  Location: [facility];  Service: Orthopedics;  Laterality: Left;  ? TOTAL HIP ARTHROPLASTY Right 06/04/2019  ? Procedure: RIGHT TOTAL HIP ARTHROPLASTY ANTERIOR APPROACH;  Surgeon: Mcarthur Rossetti, MD;  Location: [facility];  Service: Orthopedics;  Laterality: Right;  ?  ?No current facility-administered medications for this encounter.  ? ?Current Outpatient Medications  ?Medication Sig Dispense Refill Last Dose  ? albuterol (VENTOLIN HFA) 108 (90 Base) MCG/ACT inhaler Inhale 1-2 puffs into the lungs every 6 (six) hours as needed for wheezing or shortness of breath. (Patient not taking: Reported on 12/17/2021) 8 g 5   ? Cholecalciferol (VITAMIN D3 PO) Take 1 tablet by mouth in the morning.     ? Cyanocobalamin (VITAMIN B-12 PO) Take 1 tablet by mouth in the morning.     ? diphenhydrAMINE (BENADRYL) 25 MG tablet Take 25 mg by mouth at bedtime. (Patient not taking: Reported on 12/17/2021)     ? atorvastatin (LIPITOR) 40 MG tablet Take 1 tablet by mouth once daily (Patient not taking: Reported on 12/11/2021) 90 tablet 1 Not Taking  ? Fluticasone-Umeclidin-Vilant (TRELEGY ELLIPTA) 200-62.5-25 MCG/ACT AEPB Inhale 1 puff  into the lungs daily. 14 each 0   ? Fluticasone-Umeclidin-Vilant (TRELEGY ELLIPTA) 200-62.5-25 MCG/ACT AEPB Inhale 1 puff into the lungs daily. 1 each 0   ? predniSONE (DELTASONE) 10 MG tablet Start 40mg  daily then reduce 10mg  every 3 days until 20mg  daily 30 tablet 0   ? QUEtiapine (SEROQUEL) 400 MG tablet Take 400 mg by mouth 2 (two) times daily.     ? Spacer/Aero-Holding Chambers DEVI 1 Device by Does not apply route daily as needed. (Patient not  taking: Reported on 12/14/2021) 1 each 0 Not Taking  ? ?Allergies  ?Allergen Reactions  ? Celexa [Citalopram Hydrobromide]   ?  "goes weird"  ? Gabapentin   ?  "goes weird"  ?Other reaction(s): hall  ?  ?Social History  ? ?Tobacco Use  ? Smoking status: Former  ?  Packs/day: 1.25  ?  Years: 43.00  ?  Pack years: 53.75  ?  Types: Cigarettes  ?  Quit date: 05/19/2020  ?  Years since quitting: 1.6  ?  Passive exposure: Never  ? Smokeless tobacco: Never  ?Substance Use Topics  ? Alcohol use: Yes  ?  Comment: occ  ?  ?Family History  ?Problem Relation Age of Onset  ? CAD Mother 63  ? COPD Mother   ? Depression Mother   ? Heart attack Father   ? Colon polyps Sister   ? Heart attack Maternal Grandmother   ? Early death Maternal Grandfather   ? Hearing loss Paternal Grandfather   ? Bipolar disorder Son   ? Asthma Daughter   ?  ? ?Review of Systems  ?Musculoskeletal:  Positive for joint swelling.  ?All other systems reviewed and are negative. ? ?Objective: ? ?Physical Exam ?Vitals reviewed.  ?Constitutional:   ?   Appearance: Normal appearance.  ?HENT:  ?   Head: Normocephalic and atraumatic.  ?Eyes:  ?   Extraocular Movements: Extraocular movements intact.  ?   Pupils: Pupils are equal, round, and reactive to light.  ?Cardiovascular:  ?   Rate and Rhythm: Normal rate and regular rhythm.  ?Pulmonary:  ?   Breath sounds: Normal breath sounds.  ?Abdominal:  ?   Palpations: Abdomen is soft.  ?Musculoskeletal:  ?   Cervical back: Normal range of motion and neck supple.  ?   Left knee: Effusion, bony tenderness and crepitus present. Decreased range of motion. Tenderness present over the medial joint line, lateral joint line and patellar tendon. Abnormal meniscus.  ?Neurological:  ?   Mental Status: She is alert and oriented to person, place, and time.  ?Psychiatric:     ?   Behavior: Behavior normal.  ? ? ?Vital signs in last 24 hours: ?  ? ?Labs: ? ? ?Estimated body mass index is 33 kg/m? as calculated from the following: ?   Height as of 12/26/21: 5\' 10"  (1.778 m). ?  Weight as of 12/26/21: 104.3 kg. ? ? ?Imaging Review ?Plain radiographs demonstrate severe degenerative joint disease of the left knee(s). The overall alignment ismild varus. The bone quality appears to be good for age and reported activity level. ? ? ? ? ? ?Assessment/Plan: ? ?End stage arthritis, left knee  ? ?The patient history, physical examination, clinical judgment of the provider and imaging studies are consistent with end stage degenerative joint disease of the left knee(s) and total knee arthroplasty is deemed medically necessary. The treatment options including medical management, injection therapy arthroscopy and arthroplasty were discussed at length. The risks and benefits of total  knee arthroplasty were presented and reviewed. The risks due to aseptic loosening, infection, stiffness, patella tracking problems, thromboembolic complications and other imponderables were discussed. The patient acknowledged the explanation, agreed to proceed with the plan and consent was signed. Patient is being admitted for inpatient treatment for surgery, pain control, PT, OT, prophylactic antibiotics, VTE prophylaxis, progressive ambulation and ADL's and discharge planning. The patient is planning to be discharged home with home health services ? ? ? ? ?

## 2021-12-28 ENCOUNTER — Ambulatory Visit (HOSPITAL_COMMUNITY): Payer: Medicare Other | Admitting: Registered Nurse

## 2021-12-28 ENCOUNTER — Encounter (HOSPITAL_COMMUNITY)
Admission: RE | Disposition: A | Discharge status: Skilled Nursing Facility | Payer: Self-pay | Source: Home / Self Care | Attending: Orthopaedic Surgery

## 2021-12-28 ENCOUNTER — Encounter (HOSPITAL_COMMUNITY): Payer: Self-pay | Admitting: Orthopaedic Surgery

## 2021-12-28 ENCOUNTER — Observation Stay (HOSPITAL_COMMUNITY): Payer: Medicare Other

## 2021-12-28 ENCOUNTER — Other Ambulatory Visit: Payer: Self-pay

## 2021-12-28 ENCOUNTER — Inpatient Hospital Stay (HOSPITAL_COMMUNITY)
Admission: RE | Admit: 2021-12-28 | Discharge: 2021-12-31 | DRG: 470 | Disposition: A | Discharge status: Skilled Nursing Facility | Payer: Medicare Other | Attending: Orthopaedic Surgery | Admitting: Orthopaedic Surgery

## 2021-12-28 DIAGNOSIS — Z87891 Personal history of nicotine dependence: Secondary | ICD-10-CM

## 2021-12-28 DIAGNOSIS — Z96652 Presence of left artificial knee joint: Principal | ICD-10-CM

## 2021-12-28 DIAGNOSIS — J449 Chronic obstructive pulmonary disease, unspecified: Secondary | ICD-10-CM

## 2021-12-28 DIAGNOSIS — E538 Deficiency of other specified B group vitamins: Secondary | ICD-10-CM | POA: Diagnosis present

## 2021-12-28 DIAGNOSIS — Z7951 Long term (current) use of inhaled steroids: Secondary | ICD-10-CM

## 2021-12-28 DIAGNOSIS — Z8249 Family history of ischemic heart disease and other diseases of the circulatory system: Secondary | ICD-10-CM

## 2021-12-28 DIAGNOSIS — M1712 Unilateral primary osteoarthritis, left knee: Secondary | ICD-10-CM | POA: Diagnosis not present

## 2021-12-28 DIAGNOSIS — F4310 Post-traumatic stress disorder, unspecified: Secondary | ICD-10-CM | POA: Diagnosis present

## 2021-12-28 DIAGNOSIS — F419 Anxiety disorder, unspecified: Secondary | ICD-10-CM | POA: Diagnosis present

## 2021-12-28 DIAGNOSIS — F319 Bipolar disorder, unspecified: Secondary | ICD-10-CM | POA: Diagnosis present

## 2021-12-28 DIAGNOSIS — Z825 Family history of asthma and other chronic lower respiratory diseases: Secondary | ICD-10-CM

## 2021-12-28 DIAGNOSIS — Z96643 Presence of artificial hip joint, bilateral: Secondary | ICD-10-CM | POA: Diagnosis present

## 2021-12-28 DIAGNOSIS — Z79899 Other long term (current) drug therapy: Secondary | ICD-10-CM

## 2021-12-28 DIAGNOSIS — Z20822 Contact with and (suspected) exposure to covid-19: Secondary | ICD-10-CM | POA: Diagnosis present

## 2021-12-28 HISTORY — PX: TOTAL KNEE ARTHROPLASTY: SHX125

## 2021-12-28 LAB — ABO/RH: ABO/RH(D): A POS

## 2021-12-28 LAB — TYPE AND SCREEN
ABO/RH(D): A POS
Antibody Screen: NEGATIVE

## 2021-12-28 SURGERY — ARTHROPLASTY, KNEE, TOTAL
Anesthesia: Monitor Anesthesia Care | Site: Knee | Laterality: Left

## 2021-12-28 MED ORDER — DEXAMETHASONE SODIUM PHOSPHATE 10 MG/ML IJ SOLN
INTRAMUSCULAR | Status: DC | PRN
  Administered 2021-12-28: 10 mg via INTRAVENOUS

## 2021-12-28 MED ORDER — HYDROMORPHONE HCL 1 MG/ML IJ SOLN
0.5000 mg | INTRAMUSCULAR | Status: DC | PRN
  Administered 2021-12-28 – 2021-12-29 (×2): 1 mg via INTRAVENOUS
  Filled 2021-12-28 (×2): qty 1

## 2021-12-28 MED ORDER — FLUTICASONE FUROATE-VILANTEROL 200-25 MCG/ACT IN AEPB: 1.0000 | INHALATION_SPRAY | Freq: Every day | RESPIRATORY_TRACT | Status: DC

## 2021-12-28 MED ORDER — PROPOFOL 10 MG/ML IV BOLUS
INTRAVENOUS | Status: DC | PRN
  Administered 2021-12-28 (×5): 20 mg via INTRAVENOUS

## 2021-12-28 MED ORDER — 0.9 % SODIUM CHLORIDE (POUR BTL) OPTIME
TOPICAL | Status: DC | PRN
  Administered 2021-12-28: 1000 mL

## 2021-12-28 MED ORDER — ONDANSETRON HCL 4 MG PO TABS: 4.0000 mg | ORAL_TABLET | Freq: Four times a day (QID) | ORAL | Status: DC | PRN

## 2021-12-28 MED ORDER — PREDNISONE 20 MG PO TABS
20.0000 mg | ORAL_TABLET | Freq: Every day | ORAL | Status: DC
  Administered 2021-12-29 – 2021-12-31 (×3): 20 mg via ORAL
  Filled 2021-12-28 (×3): qty 1

## 2021-12-28 MED ORDER — ASPIRIN 81 MG PO CHEW
81.0000 mg | CHEWABLE_TABLET | Freq: Two times a day (BID) | ORAL | Status: DC
  Administered 2021-12-29 – 2021-12-31 (×5): 81 mg via ORAL
  Filled 2021-12-28 (×5): qty 1

## 2021-12-28 MED ORDER — BUPIVACAINE IN DEXTROSE 0.75-8.25 % IT SOLN
INTRATHECAL | Status: DC | PRN
  Administered 2021-12-28: 1.6 mL via INTRATHECAL

## 2021-12-28 MED ORDER — METHOCARBAMOL 500 MG IVPB - SIMPLE MED
500.0000 mg | Freq: Four times a day (QID) | INTRAVENOUS | Status: DC | PRN
  Filled 2021-12-28: qty 50

## 2021-12-28 MED ORDER — METOCLOPRAMIDE HCL 5 MG PO TABS: 5.0000 mg | ORAL_TABLET | Freq: Three times a day (TID) | ORAL | Status: DC | PRN

## 2021-12-28 MED ORDER — METOCLOPRAMIDE HCL 5 MG/ML IJ SOLN: 5.0000 mg | Freq: Three times a day (TID) | INTRAMUSCULAR | Status: DC | PRN

## 2021-12-28 MED ORDER — ALUM & MAG HYDROXIDE-SIMETH 200-200-20 MG/5ML PO SUSP: 30.0000 mL | ORAL | Status: DC | PRN

## 2021-12-28 MED ORDER — CHLORHEXIDINE GLUCONATE 0.12 % MT SOLN: 15.0000 mL | Freq: Once | OROMUCOSAL | Status: DC

## 2021-12-28 MED ORDER — OXYCODONE HCL 5 MG PO TABS: 5.0000 mg | ORAL_TABLET | Freq: Once | ORAL | Status: DC | PRN

## 2021-12-28 MED ORDER — PROPOFOL 1000 MG/100ML IV EMUL
INTRAVENOUS | Status: AC
  Filled 2021-12-28: qty 100

## 2021-12-28 MED ORDER — MEPERIDINE HCL 50 MG/ML IJ SOLN: 6.2500 mg | INTRAMUSCULAR | Status: DC | PRN

## 2021-12-28 MED ORDER — POVIDONE-IODINE 10 % EX SWAB
2.0000 "application " | Freq: Once | CUTANEOUS | Status: AC
  Administered 2021-12-28: 2 via TOPICAL

## 2021-12-28 MED ORDER — POLYETHYLENE GLYCOL 3350 17 G PO PACK
17.0000 g | PACK | Freq: Every day | ORAL | Status: DC | PRN
  Administered 2021-12-30 – 2021-12-31 (×2): 17 g via ORAL
  Filled 2021-12-28 (×2): qty 1

## 2021-12-28 MED ORDER — ONDANSETRON HCL 4 MG/2ML IJ SOLN: 4.0000 mg | Freq: Four times a day (QID) | INTRAMUSCULAR | Status: DC | PRN

## 2021-12-28 MED ORDER — MENTHOL 3 MG MT LOZG: 1.0000 | LOZENGE | OROMUCOSAL | Status: DC | PRN

## 2021-12-28 MED ORDER — CEFAZOLIN SODIUM-DEXTROSE 2-4 GM/100ML-% IV SOLN
INTRAVENOUS | Status: AC
  Filled 2021-12-28: qty 100

## 2021-12-28 MED ORDER — CEFAZOLIN SODIUM-DEXTROSE 1-4 GM/50ML-% IV SOLN
1.0000 g | Freq: Four times a day (QID) | INTRAVENOUS | Status: AC
  Administered 2021-12-28 – 2021-12-29 (×2): 1 g via INTRAVENOUS
  Filled 2021-12-28 (×2): qty 50

## 2021-12-28 MED ORDER — QUETIAPINE FUMARATE 50 MG PO TABS
400.0000 mg | ORAL_TABLET | Freq: Two times a day (BID) | ORAL | Status: DC
  Administered 2021-12-28 – 2021-12-30 (×3): 400 mg via ORAL
  Filled 2021-12-28 (×3): qty 8

## 2021-12-28 MED ORDER — OXYCODONE HCL 5 MG PO TABS
5.0000 mg | ORAL_TABLET | ORAL | Status: DC | PRN
  Administered 2021-12-28: 10 mg via ORAL
  Filled 2021-12-28: qty 2

## 2021-12-28 MED ORDER — ONDANSETRON HCL 4 MG/2ML IJ SOLN
INTRAMUSCULAR | Status: AC
  Filled 2021-12-28: qty 2

## 2021-12-28 MED ORDER — ORAL CARE MOUTH RINSE: 15.0000 mL | Freq: Once | OROMUCOSAL | Status: DC

## 2021-12-28 MED ORDER — METHOCARBAMOL 500 MG PO TABS
500.0000 mg | ORAL_TABLET | Freq: Four times a day (QID) | ORAL | Status: DC | PRN
  Administered 2021-12-28 – 2021-12-31 (×7): 500 mg via ORAL
  Filled 2021-12-28 (×7): qty 1

## 2021-12-28 MED ORDER — SODIUM CHLORIDE 0.9 % IR SOLN
Status: DC | PRN
  Administered 2021-12-28: 1000 mL

## 2021-12-28 MED ORDER — FLUTICASONE FUROATE-VILANTEROL 200-25 MCG/ACT IN AEPB
1.0000 | INHALATION_SPRAY | Freq: Every day | RESPIRATORY_TRACT | Status: DC
  Administered 2021-12-29 – 2021-12-30 (×2): 1 via RESPIRATORY_TRACT
  Filled 2021-12-28: qty 28

## 2021-12-28 MED ORDER — ACETAMINOPHEN 160 MG/5ML PO SOLN: 325.0000 mg | ORAL | Status: DC | PRN

## 2021-12-28 MED ORDER — STERILE WATER FOR IRRIGATION IR SOLN
Status: DC | PRN
  Administered 2021-12-28: 2000 mL

## 2021-12-28 MED ORDER — PHENOL 1.4 % MT LIQD: 1.0000 | OROMUCOSAL | Status: DC | PRN

## 2021-12-28 MED ORDER — FENTANYL CITRATE PF 50 MCG/ML IJ SOSY
50.0000 ug | PREFILLED_SYRINGE | INTRAMUSCULAR | Status: DC
  Administered 2021-12-28: 100 ug via INTRAVENOUS
  Filled 2021-12-28: qty 2

## 2021-12-28 MED ORDER — UMECLIDINIUM BROMIDE 62.5 MCG/ACT IN AEPB: 1.0000 | INHALATION_SPRAY | Freq: Every day | RESPIRATORY_TRACT | Status: DC

## 2021-12-28 MED ORDER — OXYCODONE HCL 5 MG PO TABS
10.0000 mg | ORAL_TABLET | ORAL | Status: DC | PRN
  Administered 2021-12-28: 10 mg via ORAL
  Administered 2021-12-29 – 2021-12-31 (×10): 15 mg via ORAL
  Filled 2021-12-28: qty 2
  Filled 2021-12-28 (×10): qty 3

## 2021-12-28 MED ORDER — PROPOFOL 500 MG/50ML IV EMUL
INTRAVENOUS | Status: DC | PRN
  Administered 2021-12-28: 75 ug/kg/min via INTRAVENOUS

## 2021-12-28 MED ORDER — PROPOFOL 10 MG/ML IV BOLUS
INTRAVENOUS | Status: AC
  Filled 2021-12-28: qty 20

## 2021-12-28 MED ORDER — DOCUSATE SODIUM 100 MG PO CAPS
100.0000 mg | ORAL_CAPSULE | Freq: Two times a day (BID) | ORAL | Status: DC
  Administered 2021-12-28 – 2021-12-31 (×6): 100 mg via ORAL
  Filled 2021-12-28 (×6): qty 1

## 2021-12-28 MED ORDER — ROPIVACAINE HCL 7.5 MG/ML IJ SOLN
INTRAMUSCULAR | Status: DC | PRN
  Administered 2021-12-28: 20 mL via PERINEURAL

## 2021-12-28 MED ORDER — SODIUM CHLORIDE 0.9 % IV SOLN: INTRAVENOUS | Status: DC

## 2021-12-28 MED ORDER — CEFAZOLIN SODIUM-DEXTROSE 2-4 GM/100ML-% IV SOLN
2.0000 g | INTRAVENOUS | Status: AC
  Administered 2021-12-28: 2 g via INTRAVENOUS
  Filled 2021-12-28: qty 100

## 2021-12-28 MED ORDER — BUPIVACAINE-EPINEPHRINE (PF) 0.25% -1:200000 IJ SOLN
INTRAMUSCULAR | Status: AC
  Filled 2021-12-28: qty 30

## 2021-12-28 MED ORDER — TRANEXAMIC ACID-NACL 1000-0.7 MG/100ML-% IV SOLN
1000.0000 mg | INTRAVENOUS | Status: AC
  Administered 2021-12-28: 1000 mg via INTRAVENOUS
  Filled 2021-12-28: qty 100

## 2021-12-28 MED ORDER — PANTOPRAZOLE SODIUM 40 MG PO TBEC
40.0000 mg | DELAYED_RELEASE_TABLET | Freq: Every day | ORAL | Status: DC
  Administered 2021-12-29 – 2021-12-31 (×3): 40 mg via ORAL
  Filled 2021-12-28 (×3): qty 1

## 2021-12-28 MED ORDER — DIPHENHYDRAMINE HCL 12.5 MG/5ML PO ELIX: 12.5000 mg | ORAL_SOLUTION | ORAL | Status: DC | PRN

## 2021-12-28 MED ORDER — ONDANSETRON HCL 4 MG/2ML IJ SOLN: 4.0000 mg | Freq: Once | INTRAMUSCULAR | Status: DC | PRN

## 2021-12-28 MED ORDER — ONDANSETRON HCL 4 MG/2ML IJ SOLN
INTRAMUSCULAR | Status: DC | PRN
Start: 2021-12-28 — End: 2021-12-28
  Administered 2021-12-28: 4 mg via INTRAVENOUS

## 2021-12-28 MED ORDER — UMECLIDINIUM BROMIDE 62.5 MCG/ACT IN AEPB
1.0000 | INHALATION_SPRAY | Freq: Every day | RESPIRATORY_TRACT | Status: DC
  Administered 2021-12-29 – 2021-12-30 (×2): 1 via RESPIRATORY_TRACT
  Filled 2021-12-28: qty 7

## 2021-12-28 MED ORDER — MIDAZOLAM HCL 2 MG/2ML IJ SOLN
1.0000 mg | INTRAMUSCULAR | Status: DC
  Administered 2021-12-28: 2 mg via INTRAVENOUS
  Filled 2021-12-28: qty 2

## 2021-12-28 MED ORDER — FENTANYL CITRATE PF 50 MCG/ML IJ SOSY: 25.0000 ug | PREFILLED_SYRINGE | INTRAMUSCULAR | Status: DC | PRN

## 2021-12-28 MED ORDER — ACETAMINOPHEN 325 MG PO TABS
325.0000 mg | ORAL_TABLET | Freq: Four times a day (QID) | ORAL | Status: DC | PRN
  Administered 2021-12-30: 650 mg via ORAL
  Filled 2021-12-28: qty 2

## 2021-12-28 MED ORDER — OXYCODONE HCL 5 MG/5ML PO SOLN: 5.0000 mg | Freq: Once | ORAL | Status: DC | PRN

## 2021-12-28 MED ORDER — ACETAMINOPHEN 325 MG PO TABS: 325.0000 mg | ORAL_TABLET | ORAL | Status: DC | PRN

## 2021-12-28 MED ORDER — LACTATED RINGERS IV SOLN: INTRAVENOUS | Status: DC

## 2021-12-28 MED ORDER — DEXAMETHASONE SODIUM PHOSPHATE 10 MG/ML IJ SOLN
INTRAMUSCULAR | Status: AC
  Filled 2021-12-28: qty 1

## 2021-12-28 SURGICAL SUPPLY — 60 items
BAG COUNTER SPONGE SURGICOUNT (BAG) IMPLANT
BAG ZIPLOCK 12X15 (MISCELLANEOUS) ×2 IMPLANT
BENZOIN TINCTURE PRP APPL 2/3 (GAUZE/BANDAGES/DRESSINGS) IMPLANT
BLADE SAG 18X100X1.27 (BLADE) ×2 IMPLANT
BLADE SURG SZ10 CARB STEEL (BLADE) ×4 IMPLANT
BNDG ELASTIC 6X5.8 VLCR STR LF (GAUZE/BANDAGES/DRESSINGS) ×3 IMPLANT
BOWL SMART MIX CTS (DISPOSABLE) IMPLANT
CEMENT BONE R 1X40 (Cement) ×2 IMPLANT
COMP FEM CEMT PERSONA SZ9 LT (Knees) ×2 IMPLANT
COMPONENT FEM CEMT PRNSA SZ9LT (Knees) IMPLANT
COOLER ICEMAN CLASSIC (MISCELLANEOUS) ×2 IMPLANT
COVER SURGICAL LIGHT HANDLE (MISCELLANEOUS) ×2 IMPLANT
CUFF TOURN SGL QUICK 34 (TOURNIQUET CUFF) ×1
CUFF TRNQT CYL 34X4.125X (TOURNIQUET CUFF) ×1 IMPLANT
DRAPE INCISE IOBAN 66X45 STRL (DRAPES) ×2 IMPLANT
DRAPE U-SHAPE 47X51 STRL (DRAPES) ×2 IMPLANT
DRSG PAD ABDOMINAL 8X10 ST (GAUZE/BANDAGES/DRESSINGS) ×3 IMPLANT
DURAPREP 26ML APPLICATOR (WOUND CARE) ×2 IMPLANT
ELECT BLADE TIP CTD 4 INCH (ELECTRODE) ×2 IMPLANT
ELECT REM PT RETURN 15FT ADLT (MISCELLANEOUS) ×2 IMPLANT
GAUZE SPONGE 4X4 12PLY STRL (GAUZE/BANDAGES/DRESSINGS) ×2 IMPLANT
GAUZE XEROFORM 1X8 LF (GAUZE/BANDAGES/DRESSINGS) ×1 IMPLANT
GLOVE SRG 8 PF TXTR STRL LF DI (GLOVE) ×2 IMPLANT
GLOVE SURG ENC MOIS LTX SZ7.5 (GLOVE) ×2 IMPLANT
GLOVE SURG NEOPR MICRO LF SZ8 (GLOVE) ×2 IMPLANT
GLOVE SURG UNDER POLY LF SZ8 (GLOVE) ×2
GOWN STRL REUS W/ TWL XL LVL3 (GOWN DISPOSABLE) ×2 IMPLANT
GOWN STRL REUS W/TWL XL LVL3 (GOWN DISPOSABLE) ×2
HANDPIECE INTERPULSE COAX TIP (DISPOSABLE) ×1
HDLS TROCR DRIL PIN KNEE 75 (PIN) ×4
HOLDER FOLEY CATH W/STRAP (MISCELLANEOUS) IMPLANT
IMMOBILIZER KNEE 20 (SOFTGOODS) ×2
IMMOBILIZER KNEE 20 THIGH 36 (SOFTGOODS) ×1 IMPLANT
KIT TURNOVER KIT A (KITS) IMPLANT
NS IRRIG 1000ML POUR BTL (IV SOLUTION) ×2 IMPLANT
PACK TOTAL KNEE CUSTOM (KITS) ×2 IMPLANT
PAD COLD SHLDR WRAP-ON (PAD) ×2 IMPLANT
PADDING CAST ABS 3INX4YD NS (CAST SUPPLIES) ×1
PADDING CAST ABS COTTON 3X4 (CAST SUPPLIES) IMPLANT
PADDING CAST COTTON 6X4 STRL (CAST SUPPLIES) ×4 IMPLANT
PIN DRILL HDLS TROCAR 75 4PK (PIN) IMPLANT
PROTECTOR NERVE ULNAR (MISCELLANEOUS) ×2 IMPLANT
SCREW FEMALE HEX FIX 25X2.5 (ORTHOPEDIC DISPOSABLE SUPPLIES) ×1 IMPLANT
SET HNDPC FAN SPRY TIP SCT (DISPOSABLE) ×1 IMPLANT
SET PAD KNEE POSITIONER (MISCELLANEOUS) ×2 IMPLANT
SPIKE FLUID TRANSFER (MISCELLANEOUS) IMPLANT
STAPLER VISISTAT 35W (STAPLE) IMPLANT
STEM POLY PAT PLY 35M KNEE (Knees) ×1 IMPLANT
STEM TIBIA 5 DEG SZ E L KNEE (Knees) IMPLANT
STEM TIBIAL 10 8-11 EF POLY LT (Joint) ×1 IMPLANT
STRIP CLOSURE SKIN 1/2X4 (GAUZE/BANDAGES/DRESSINGS) IMPLANT
SUT MNCRL AB 4-0 PS2 18 (SUTURE) IMPLANT
SUT VIC AB 0 CT1 27 (SUTURE) ×1
SUT VIC AB 0 CT1 27XBRD ANTBC (SUTURE) ×1 IMPLANT
SUT VIC AB 1 CT1 36 (SUTURE) ×4 IMPLANT
SUT VIC AB 2-0 CT1 27 (SUTURE) ×2
SUT VIC AB 2-0 CT1 TAPERPNT 27 (SUTURE) ×2 IMPLANT
TIBIA STEM 5 DEG SZ E L KNEE (Knees) ×2 IMPLANT
TRAY FOLEY SLVR 14FR TEMP STAT (SET/KITS/TRAYS/PACK) ×1 IMPLANT
WATER STERILE IRR 1000ML POUR (IV SOLUTION) ×4 IMPLANT

## 2021-12-28 NOTE — Anesthesia Preprocedure Evaluation (Addendum)
Anesthesia Evaluation  ?Patient identified by MRN, date of birth, ID band ?Patient awake ? ? ? ?Reviewed: ?Allergy & Precautions, H&P , NPO status , Patient's Chart, lab work & pertinent test results ? ?Airway ?Mallampati: I ? ?TM Distance: >3 FB ?Neck ROM: Full ? ? ? Dental ? ?(+) Teeth Intact, Dental Advisory Given, Caps ?  ?Pulmonary ?COPD, former smoker,  ?  ?breath sounds clear to auscultation ? ? ? ? ? ? Cardiovascular ?negative cardio ROS ? ? ?Rhythm:Regular Rate:Normal ? ? ?  ?Neuro/Psych ?PSYCHIATRIC DISORDERS Anxiety Depression Bipolar Disorder   ? GI/Hepatic ?negative GI ROS, Neg liver ROS,   ?Endo/Other  ?negative endocrine ROS ? Renal/GU ?negative Renal ROS  ? ?  ?Musculoskeletal ? ?(+) Arthritis ,  ? Abdominal ?Normal abdominal exam  (+)   ?Peds ? Hematology ?negative hematology ROS ?(+)   ?Anesthesia Other Findings ? ? Reproductive/Obstetrics ? ?  ? ? ? ? ? ? ? ? ? ? ? ? ? ?  ?  ? ? ? ? ? ? ? ?Anesthesia Physical ? ?Anesthesia Plan ? ?ASA: 3 ? ?Anesthesia Plan: Spinal, MAC and Regional  ? ?Post-op Pain Management: Regional block* and Minimal or no pain anticipated  ? ?Induction: Intravenous ? ?PONV Risk Score and Plan: 2 and Ondansetron, Propofol infusion and Treatment may vary due to age or medical condition ? ?Airway Management Planned: Natural Airway, Simple Face Mask and Nasal Cannula ? ?Additional Equipment: None ? ?Intra-op Plan:  ? ?Post-operative Plan:  ? ?Informed Consent: I have reviewed the patients History and Physical, chart, labs and discussed the procedure including the risks, benefits and alternatives for the proposed anesthesia with the patient or authorized representative who has indicated his/her understanding and acceptance.  ? ? ? ? ? ?Plan Discussed with: CRNA and Anesthesiologist ? ?Anesthesia Plan Comments:   ? ? ? ? ? ?Anesthesia Quick Evaluation ? ?

## 2021-12-28 NOTE — Transfer of Care (Signed)
Immediate Anesthesia Transfer of Care Note ? ?[name] ? ?Procedure(s) Performed: Left TOTAL KNEE ARTHROPLASTY (Left: Knee) ? ?Patient Location: PACU ? ?Anesthesia Type:Regional and Spinal ? ?Level of Consciousness: awake, alert  and oriented ? ?Airway & Oxygen Therapy: Patient Spontanous Breathing and Patient connected to face mask oxygen ? ?Post-op Assessment: Report given to RN and Post -op Vital signs reviewed and stable ? ?Post vital signs: Reviewed and stable ? ?Last Vitals:  ?Vitals Value Taken Time  ?BP    ?Temp    ?Pulse 77 12/28/21 1604  ?Resp 12 12/28/21 1605  ?SpO2 100 % 12/28/21 1604  ?Vitals shown include unvalidated device data. ? ?Last Pain:  ?Vitals:  ? 12/28/21 1335  ?TempSrc:   ?PainSc: 0-No pain  ?   ? ?  ? ?Complications: No notable events documented. ?

## 2021-12-28 NOTE — Anesthesia Postprocedure Evaluation (Signed)
Anesthesia Post Note ? ?[name] ? ?Procedure(s) Performed: Left TOTAL KNEE ARTHROPLASTY (Left: Knee) ? ?  ? ?Patient location during evaluation: PACU ?Anesthesia Type: Regional and General ?Level of consciousness: awake and alert ?Pain management: pain level controlled ?Vital Signs Assessment: post-procedure vital signs reviewed and stable ?Respiratory status: spontaneous breathing, nonlabored ventilation, respiratory function stable and patient connected to nasal cannula oxygen ?Cardiovascular status: blood pressure returned to baseline and stable ?Postop Assessment: no apparent nausea or vomiting ?Anesthetic complications: no ? ? ?No notable events documented. ? ?Last Vitals:  ?Vitals:  ? 12/28/21 1645 12/28/21 1700  ?BP: (!) 159/70 (!) 165/80  ?Pulse: (!) 50 (!) 49  ?Resp: 12 12  ?Temp:    ?SpO2: 100% 100%  ?  ?Last Pain:  ?Vitals:  ? 12/28/21 1605  ?TempSrc:   ?PainSc: 0-No pain  ? ? ?  ?  ?  ?  ?  ?  ? ?[name] ? ? ? ? ?

## 2021-12-28 NOTE — Evaluation (Signed)
Physical Therapy Evaluation Patient Details Name: Judy [name] MRN: [id number] DOB: Sep 16, 1951 Today's Date: 12/28/2021  History of Present Illness  Pt is 71yo female presenting s/p L-TKA on 12/28/21. PMH: OA, anxiety, depression, bipolar, AVN w/bilateral THA 2020, COPD.  Clinical Impression  Pt is s/p L-TKA resulting in the deficits listed below (see PT Problem List). Pt min assist for bed mobility, mod assist for transfers and ambulation at EOB today. Pt will benefit from skilled PT to increase their independence and safety with mobility to allow discharge to the venue listed below.   CLAUDIO GAILLARD is a 71 y.o. female POD 0 s/p L-TKA. Patient reports modified independence with mobility at baseline. Patient is now limited by functional impairments (see PT problem list below) and requires mod assist for transfers and gait with RW. Patient was able to ambulate three feet with RW at EOB and mod assist for knee blocking. Patient instructed in exercise to facilitate ROM and circulation to manage edema. Patient will benefit from continued skilled PT interventions to address impairments and progress towards PLOF. Acute PT will follow to progress mobility and stair training in preparation for safe discharge to the destination indicated below.         Recommendations for follow up therapy are one component of a multi-disciplinary discharge planning process, led by the attending physician.  Recommendations may be updated based on patient status, additional functional criteria and insurance authorization.  Follow Up Recommendations Skilled nursing-short term rehab (<3 hours/day)    Assistance Recommended at Discharge Intermittent Supervision/Assistance  Patient can return home with the following  A little help with walking and/or transfers;A little help with bathing/dressing/bathroom;Assistance with cooking/housework;Help with stairs or ramp for entrance;Assist for transportation     Equipment Recommendations None recommended by PT (pt has recommended DME)  Recommendations for Other Services  OT consult    Functional Status Assessment Patient has had a recent decline in their functional status and demonstrates the ability to make significant improvements in function in a reasonable and predictable amount of time.     Precautions / Restrictions Precautions Precautions: Fall Restrictions Weight Bearing Restrictions: Yes LLE Weight Bearing: Weight bearing as tolerated      Mobility  Bed Mobility Overal bed mobility: Needs Assistance Bed Mobility: Supine to Sit, Sit to Supine     Supine to sit: Min assist Sit to supine: Min assist   General bed mobility comments: Pt min assist for bed mobility to advance LLE off/on bed, otherwise min guard.    Transfers Overall transfer level: Needs assistance Equipment used: Rolling walker (2 wheels) Transfers: Sit to/from Stand Sit to Stand: Mod assist           General transfer comment: Pt mod assist for lift assist and blocking L knee from buckling in stance. VC for sequencing and hand placement. Upon standing, pt demonstrated slight forward lean. Pt able to perform stand to sit back to EOB with min guard only, no physical assist required.    Ambulation/Gait Ambulation/Gait assistance: Min assist Gait Distance (Feet): 3 Feet Assistive device: Rolling walker (2 wheels) Gait Pattern/deviations: Knees buckling, Knee flexed in stance - left, Narrow base of support       General Gait Details: Pt performed sidesteps at EOB to adjust positioning, PT provided blocking of L knee to prevent buckling and max verbal cues for sequenings.  Stairs            Wheelchair Mobility    Modified Rankin (Stroke Patients Only)  Balance Overall balance assessment: Needs assistance Sitting-balance support: Feet supported, No upper extremity supported Sitting balance-Leahy Scale: Fair     Standing balance  support: Bilateral upper extremity supported, Reliant on assistive device for balance, During functional activity Standing balance-Leahy Scale: Poor Standing balance comment: Pt reliant on BUE support of RW during functional tasks, also demonstrated strong anterior lean as well requiring blocking of L knee from buckling.                             Pertinent Vitals/Pain Pain Assessment Pain Assessment: 0-10 Pain Score: 2  Pain Location: left knee Pain Descriptors / Indicators: Operative site guarding, Discomfort, Grimacing Pain Intervention(s): Ice applied, Monitored during session, Limited activity within patient's tolerance, Repositioned    Home Living Family/patient expects to be discharged to:: Skilled nursing facility Living Arrangements: Alone   Type of Home: House Home Access: Stairs to enter Entrance Stairs-Rails: Right;Left;Can reach both Entrance Stairs-Number of Steps: 4   Home Layout: One level Home Equipment: Agricultural consultant (2 wheels);Cane - single point Additional Comments: Expects to discharge to Rehab at [facility], approved by [employer] but would like to talk to Child psychotherapist to confirm    Prior Function Prior Level of Function : Independent/Modified Independent             Mobility Comments: RW at home and [facility] outside for ambulation ADLs Comments: Mostly indpendent, but can't raise her arms over her head to wash her hair or put socks and shoes on herself. Struggles with donning underwear.     Hand Dominance   Dominant Hand: Right    Extremity/Trunk Assessment   Upper Extremity Assessment Upper Extremity Assessment: Defer to OT evaluation    Lower Extremity Assessment Lower Extremity Assessment: RLE deficits/detail;LLE deficits/detail RLE Deficits / Details: MMT: ank df/pf 4/5 RLE Sensation: WNL LLE Deficits / Details: MMT: ank df/pf 4/5. Reports less sensation on bottom of foot as compared to RLE. LLE Sensation: decreased light touch     Cervical / Trunk Assessment Cervical / Trunk Assessment: Normal  Communication   Communication: No difficulties  Cognition Arousal/Alertness: Awake/alert Behavior During Therapy: WFL for tasks assessed/performed Overall Cognitive Status: Within Functional Limits for tasks assessed                                          General Comments General comments (skin integrity, edema, etc.): Educated pt on incentive spirometry and directed to peform 1x/hour.    Exercises Total Joint Exercises Ankle Circles/Pumps: AROM, Both, 20 reps Quad Sets: AROM, Left, 5 reps   Assessment/Plan    PT Assessment Patient needs continued PT services  PT Problem List Decreased strength;Decreased range of motion;Decreased activity tolerance;Decreased balance;Decreased mobility;Decreased coordination;Pain       PT Treatment Interventions DME instruction;Gait training;Stair training;Functional mobility training;Therapeutic activities;Therapeutic exercise;Balance training;Neuromuscular re-education;Patient/family education    PT Goals (Current goals can be found in the Care Plan section)  Acute Rehab PT Goals Patient Stated Goal: To walk outside with grandkids and look at the flowers PT Goal Formulation: With patient Time For Goal Achievement: 01/04/22 Potential to Achieve Goals: Good    Frequency 7X/week     Co-evaluation               AM-PAC PT "6 Clicks" Mobility  Outcome Measure Help needed turning from your back to your  side while in a flat bed without using bedrails?: A Little Help needed moving from lying on your back to sitting on the side of a flat bed without using bedrails?: A Little Help needed moving to and from a bed to a chair (including a wheelchair)?: A Lot Help needed standing up from a chair using your arms (e.g., wheelchair or bedside chair)?: A Little Help needed to walk in hospital room?: A Lot Help needed climbing 3-5 steps with a railing? : A Lot 6  Click Score: 15    End of Session Equipment Utilized During Treatment: Gait belt Activity Tolerance: Patient tolerated treatment well Patient left: in bed;with call bell/phone within reach;with bed alarm set Nurse Communication: Mobility status PT Visit Diagnosis: Unsteadiness on feet (R26.81);Difficulty in walking, not elsewhere classified (R26.2)    Time: 7253-6644 PT Time Calculation (min) (ACUTE ONLY): 32 min   Charges:   PT Evaluation $PT Eval Low Complexity: 1 Low PT Treatments $Therapeutic Activity: 8-22 mins        Jamesetta Geralds, PT, DPT [facility] Department Office: [phone] Pager: [phone]  Jamesetta Geralds 12/28/2021, 6:50 PM

## 2021-12-28 NOTE — Interval H&P Note (Signed)
History and Physical Interval Note: The patient understands that she is here today for a left knee replacement to treat her left knee osteoarthritis.  There has been no acute or interval change in her medical status.  Please see recent H&P.  The risks and benefits of surgery been explained in detail and informed consent is obtained. ? ?12/28/2021 ?12:43 PM ? ?Judy [name]  has presented today for surgery, with the diagnosis of Osteoarthritis / Degenerative Joint Diease Left Knee.  The various methods of treatment have been discussed with the patient and family. After consideration of risks, benefits and other options for treatment, the patient has consented to  Procedure(s): ?Left TOTAL KNEE ARTHROPLASTY (Left) as a surgical intervention.  The patient's history has been reviewed, patient examined, no change in status, stable for surgery.  I have reviewed the patient's chart and labs.  Questions were answered to the patient's satisfaction.   ? ? ?[name] ? ? ?

## 2021-12-28 NOTE — Progress Notes (Signed)
AssistedDr. Oddono with left, adductor canal, ultrasound guided block. Side rails up, monitors on throughout procedure. See vital signs in flow sheet. Tolerated Procedure well. ? ?

## 2021-12-28 NOTE — Brief Op Note (Signed)
12/28/2021 ? ?3:44 PM ? ?PATIENT:  Judy [name]  71 y.o. female ? ?PRE-OPERATIVE DIAGNOSIS:  Osteoarthritis / Degenerative Joint Diease Left Knee ? ?POST-OPERATIVE DIAGNOSIS:  Osteoarthritis / Degenerative Joint Diease Left Knee ? ?PROCEDURE:  Procedure(s): ?Left TOTAL KNEE ARTHROPLASTY (Left) ? ?SURGEON:  Surgeon(s) and Role: ?   [name], MD - Primary ? ?PHYSICIAN ASSISTANT:  Rexene Edison, PA-C ? ?ANESTHESIA:   regional and spinal ? ?COUNTS:  YES ? ?TOURNIQUET:   ?Total Tourniquet Time Documented: ?Thigh (Left) - 47 minutes ?Total: Thigh (Left) - 47 minutes ? ? ?DICTATION: .Other Dictation: Dictation Number [id number] ? ?PLAN OF CARE: Admit for overnight observation ? ?PATIENT DISPOSITION:  PACU - hemodynamically stable. ?  ?Delay start of Pharmacological VTE agent (>24hrs) due to surgical blood loss or risk of bleeding: no ? ?

## 2021-12-28 NOTE — Anesthesia Procedure Notes (Signed)
Anesthesia Regional Block: Adductor canal block  ? ?Pre-Anesthetic Checklist: , timeout performed,  Correct Patient, Correct Site, Correct Laterality,  Correct Procedure, Correct Position, site marked,  Risks and benefits discussed,  Surgical consent,  Pre-op evaluation,  At surgeon's request and post-op pain management ? ?Laterality: Left ? ?Prep: chloraprep     ?  ?Needles:  ?Injection technique: Single-shot ? ?Needle Type: Echogenic Stimulator Needle   ? ? ?Needle Length: 5cm  ?Needle Gauge: 22  ? ? ? ?Additional Needles: ? ? ?Procedures:, nerve stimulator,,, ultrasound used (permanent image in chart),,    ?Narrative:  ?Start time: 12/28/2021 1:30 PM ?End time: 12/28/2021 1:35 PM ?Injection made incrementally with aspirations every 5 mL. ? ?Performed by: Personally  ?Anesthesiologist: Bethena Midget, MD ? ?Additional Notes: ?Functioning IV was confirmed and monitors were applied.  A 67mm 22ga Arrow echogenic stimulator needle was used. Sterile prep and drape,hand hygiene and sterile gloves were used. Ultrasound guidance: relevant anatomy identified, needle position confirmed, local anesthetic spread visualized around nerve(s)., vascular puncture avoided.  Image printed for medical record. Negative aspiration and negative test dose prior to incremental administration of local anesthetic. The patient tolerated the procedure well. ? ? ? ? ? ?

## 2021-12-28 NOTE — Anesthesia Procedure Notes (Signed)
Spinal ? ?Patient location during procedure: OR ?End time: 12/28/2021 2:18 PM ?Reason for block: surgical anesthesia ?Staffing ?Performed: resident/CRNA  ?Resident/CRNA: Atilano Median, CRNA ?Preanesthetic Checklist ?Completed: patient identified, IV checked, site marked, risks and benefits discussed, surgical consent, monitors and equipment checked, pre-op evaluation and timeout performed ?Spinal Block ?Patient position: sitting ?Prep: DuraPrep ?Patient monitoring: heart rate, continuous pulse ox and blood pressure ?Approach: midline ?Location: L3-4 ?Injection technique: single-shot ?Needle ?Needle type: Spinocan  ?Needle gauge: 24 G ?Needle length: 9 cm ?Assessment ?Sensory level: T6 ?Events: CSF return ? ? ? ?

## 2021-12-28 NOTE — Plan of Care (Signed)
°  Problem: Education: °Goal: Knowledge of the prescribed therapeutic regimen will improve °Outcome: Progressing °  °Problem: Clinical Measurements: °Goal: Postoperative complications will be avoided or minimized °Outcome: Progressing °  °Problem: Pain Management: °Goal: Pain level will decrease with appropriate interventions °Outcome: Progressing °  °

## 2021-12-28 NOTE — Op Note (Signed)
NAME: Judy Judy [name], Judy Judy [name]. ?MEDICAL RECORD NO: [id number] ?ACCOUNT NO: [id number] ?DATE OF BIRTH: 1950/10/24 ?FACILITY: [facility] ?LOCATION: [facility] ?PHYSICIAN: Vanita Panda. Magnus Ivan, MD ? ?Operative Report  ? ?DATE OF PROCEDURE: 12/28/2021 ? ?PREOPERATIVE DIAGNOSIS:  Primary osteoarthritis and degenerative joint disease, left knee. ? ?POSTOPERATIVE DIAGNOSIS:  Primary osteoarthritis and degenerative joint disease, left knee. ? ?PROCEDURE:  Left total knee arthroplasty. ? ?IMPLANTS:  Biomet/Zimmer Persona cemented knee system with size 9 left standard femur, size E left tibial tray, 10 mm medial congruent fixed bearing polyethylene insert, 35 mm patellar button. ? ?SURGEON:  Vanita Panda. Magnus Ivan, MD ? ?ASSISTANT:  Richardean Canal, PA-C. ? ?ANESTHESIA:  ?1.  Left lower extremity adductor canal block. ?2.  Spinal. ? ?ANTIBIOTICS:  2 g IV Ancef. ? ?TOURNIQUET TIME:  Less than 1 hour. ? ?ESTIMATED BLOOD LOSS:  Less than 100 mL. ? ?COMPLICATIONS:  None. ? ?INDICATIONS:  The patient is Judy [name] 71 year old female well known to me.  I have actually replaced both of her hips.  She has developed debilitating arthritis with her left knee.  She has tried and failed all forms of conservative treatment and at this point, ? her left knee pain is daily and is detrimentally affecting her mobility, her quality of life and activities of daily living to the point she does wish to proceed with the knee replacement and we agree with this as well.  We did talk about the risk of  ?acute blood loss anemia, nerve and vessel injury, fracture, infection, DVT, implant failure and skin and soft tissue issues.  We talked about our goals being decreased pain, improve mobility and overall improve quality of life. ? ?DESCRIPTION OF PROCEDURE:  After informed consent was obtained, appropriate left knee was marked and adductor canal block was obtained in the left lower extremity in the holding room.  She was then brought to the operating room and sat up on the   ?operating table where spinal anesthesia was obtained.  She was then laid in the supine position on the operating table.  Judy [name] Foley catheter was placed.  Judy [name] nonsterile tourniquet was placed around the upper left thigh.  Her left thigh, knee, leg, and ankle  ?were prepped and draped with DuraPrep and sterile drapes including Judy [name] sterile stockinette.  Judy [name] timeout was called.  She was identified as correct patient, correct left knee.  We then used Esmarch to wrap that leg and tourniquet was inflated to 300 mm of  ?pressure. We made Judy [name] direct midline incision over the patella and carried this proximally and distally.  We dissected down the knee joint, carried out Judy [name] medial parapatellar arthrotomy, finding Judy [name] moderate joint effusion and osteoarthritis in all 3  ?compartments of the knee.  We removed remnants of the ACL, medial and lateral meniscus as well as osteophytes in all three compartments.  We then used the extramedullary cutting guide for making our proximal tibia cut, correcting for varus and valgus in  ?Judy [name] 3-degree slope.  We then made our cut to take 8 mm off the high side.  We made this cut without difficulty.  We then went to the femur and used intramedullary guide for our distal femoral cut, setting this for Judy [name] left knee at 5 degrees externally  ?rotated and Judy [name] 10 mm distal femoral cut.  We made that cut without difficulty, and brought the knee back down to full extension with Judy [name] 10 mm extension block and achieved full extension.  We then went back to the femur and put  our femoral sizing guide  ?based off the epicondylar axis.  Based off of this, we chose Judy [name] size 9 femur.  We put Judy [name] 4-in-1 cutting block for Judy [name] size 9 femur, made our anterior and posterior cuts followed by our chamfer cuts.  We then went back to the tibia and chose Judy [name] size E left  ?tibial tray for coverage on the tibial plateau, making our keel punch and drill hole off of this.  With Judy [name] size E left trial tibia, we trialed Judy [name] size 9 left femur.  We then placed  Judy [name] 10 mm fixed bearing medial congruent trial polyethylene insert.  With the ? insert in place, we put the knee through several cycles of motion.  We were pleased with range of motion and stability.  We then made our patellar cut and drilled three holes for Judy [name] size 35 patellar button and again put the knee through several cycles of ? motion with the trial instrumentation in place.  We then removed all trial instrumentation from the knee and irrigated the knee with normal saline solution.  We then mixed our cement with the knee in Judy [name] flexed position.  We cemented our left E Biomet  ?Zimmer tibial tray for the Persona knee followed by our size 9 right femur.  We then placed our 10 mm medial congruent fixed bearing polyethylene insert and cemented our size 35 patellar button.  I then held the knee in extended position and compressed  ?the knee until our cement hardened.  Once it hardened, we let the tourniquet down and hemostasis was obtained with electrocautery.  We closed the arthrotomy with interrupted #1 Vicryl suture followed by 0 Vicryl for the deep tissue and 2-0 Vicryl to  ?close the subcutaneous tissue.  The skin was closed with staples.  Judy [name] well-padded sterile dressing was applied.  She was taken to recovery room in stable condition with all final counts being correct and no complications noted.  Of note, Richardean Canal,  ?PA-C did assist during the entire case from beginning to end.  His assistance was medically necessary and appropriate for retracting and mobilizing soft tissues as well as helping guide implant placement and layered closure of the wound. ? ? ?NIK ?D: 12/28/2021 3:43:08 pm T: 12/28/2021 11:06:00 pm  ?JOB: [id number]/ [id number]  ?

## 2021-12-29 LAB — BASIC METABOLIC PANEL
Anion gap: 5 (ref 5–15)
BUN: 20 mg/dL (ref 8–23)
CO2: 24 mmol/L (ref 22–32)
Calcium: 8.7 mg/dL — ABNORMAL LOW (ref 8.9–10.3)
Chloride: 105 mmol/L (ref 98–111)
Creatinine, Ser: 0.94 mg/dL (ref 0.44–1.00)
GFR, Estimated: >60 mL/min (ref >60)
Glucose, Bld: 168 mg/dL — ABNORMAL HIGH (ref 70–99)
Potassium: 4.6 mmol/L (ref 3.5–5.1)
Sodium: 134 mmol/L — ABNORMAL LOW (ref 135–145)

## 2021-12-29 LAB — CBC
HCT: 37 % (ref 36.0–46.0)
Hemoglobin: 11.5 g/dL — ABNORMAL LOW (ref 12.0–15.0)
MCH: 28.6 pg (ref 26.0–34.0)
MCHC: 31.1 g/dL (ref 30.0–36.0)
MCV: 92 fL (ref 80.0–100.0)
Platelets: 244 10*3/uL (ref 150–400)
RBC: 4.02 MIL/uL (ref 3.87–5.11)
RDW: 13.7 % (ref 11.5–15.5)
WBC: 15.1 10*3/uL — ABNORMAL HIGH (ref 4.0–10.5)
nRBC: 0 % (ref 0.0–0.2)

## 2021-12-29 NOTE — Progress Notes (Signed)
Physical Therapy Treatment ?Patient Details ?Name: Judy [name] ?MRN: [id number] ?DOB: March 07, 1951 ?Today's Date: 12/29/2021 ? ? ?History of Present Illness Pt is 71yo female presenting s/p L-TKA on 12/28/21. PMH: OA, anxiety, depression, bipolar, AVN w/bilateral THA 2020, COPD, ? ?  ?PT Comments  ? ? Progressing with mobility. Moderate pain with activity. Will continue to progress activity as tolerated. Plan is for d/c to SNF for ST rehab.    ?Recommendations for follow up therapy are one component of a multi-disciplinary discharge planning process, led by the attending physician.  Recommendations may be updated based on patient status, additional functional criteria and insurance authorization. ? ?Follow Up Recommendations ? Follow physician's recommendations for discharge plan and follow up therapies ?  ?  ?Assistance Recommended at Discharge Frequent or constant Supervision/Assistance  ?Patient can return home with the following A little help with walking and/or transfers;A little help with bathing/dressing/bathroom;Assistance with cooking/housework;Help with stairs or ramp for entrance;Assist for transportation ?  ?Equipment Recommendations ? None recommended by PT  ?  ?Recommendations for Other Services   ? ? ?  ?Precautions / Restrictions Precautions ?Precautions: Fall ?Restrictions ?Weight Bearing Restrictions: No ?LLE Weight Bearing: Weight bearing as tolerated  ?  ? ?Mobility ? Bed Mobility ?Overal bed mobility: Needs Assistance ?Bed Mobility: Supine to Sit ?  ?  ?Supine to sit: Min assist, HOB elevated ?Sit to supine: Min assist, HOB elevated ?  ?General bed mobility comments: Assist for L LE. Increased time. ?  ? ?Transfers ?Overall transfer level: Needs assistance ?Equipment used: Rolling walker (2 wheels) ?Transfers: Sit to/from Stand ?Sit to Stand: Min assist, From elevated surface ?  ?  ?  ?  ?  ?General transfer comment: Cues for safety, technique, hand/LE placement. Assist to rise, steady,  control descent. ?  ? ?Ambulation/Gait ?Ambulation/Gait assistance: Min assist ?Gait Distance (Feet): 65 Feet ?Assistive device: Rolling walker (2 wheels) ?Gait Pattern/deviations: Step-to pattern, Decreased stance time - left ?  ?  ?  ?General Gait Details: Cues for safety, sequence, technique. Assist to intermittently steady throughout gait. Pt denied dizziness. No buckling with KI in place. ? ? ?Stairs ?  ?  ?  ?  ?  ? ? ?Wheelchair Mobility ?  ? ?Modified Rankin (Stroke Patients Only) ?  ? ? ?  ?Balance Overall balance assessment: Needs assistance ?  ?  ?  ?  ?Standing balance support: Bilateral upper extremity supported, Reliant on assistive device for balance, During functional activity ?Standing balance-Leahy Scale: Poor ?  ?  ?  ?  ?  ?  ?  ?  ?  ?  ?  ?  ?  ? ?  ?Cognition Arousal/Alertness: Awake/alert ?Behavior During Therapy: [facility] for tasks assessed/performed ?Overall Cognitive Status: Within Functional Limits for tasks assessed ?  ?  ?  ?  ?  ?  ?  ?  ?  ?  ?  ?  ?  ?  ?  ?  ?  ?  ?  ? ?  ?Exercises Total Joint Exercises ?Ankle Circles/Pumps: AROM, Both, 10 reps ?Quad Sets: AROM, Both, 10 reps ?Heel Slides: AAROM, Left, 10 reps ?Hip ABduction/ADduction: AAROM, Left, 10 reps ?Straight Leg Raises: AAROM, Left, 10 reps ?Goniometric ROM: ~10-55 degrees ? ?  ?General Comments   ?  ?  ? ?Pertinent Vitals/Pain Pain Assessment ?Pain Assessment: 0-10 ?Pain Score: 7  ?Pain Location: L knee/thigh ?Pain Descriptors / Indicators: Discomfort, Sore, Aching ?Pain Intervention(s): Limited activity within patient's tolerance, Monitored during session,  Ice applied, Repositioned  ? ? ?Home Living   ?  ?  ?  ?  ?  ?  ?  ?  ?  ?   ?  ?Prior Function    ?  ?  ?   ? ?PT Goals (current goals can now be found in the care plan section) Progress towards PT goals: Progressing toward goals ? ?  ?Frequency ? ? ? 7X/week ? ? ? ?  ?PT Plan Current plan remains appropriate  ? ? ?Co-evaluation   ?  ?  ?  ?  ? ?  ?AM-PAC PT "6 Clicks"  Mobility   ?Outcome Measure ? Help needed turning from your back to your side while in a flat bed without using bedrails?: A Little ?Help needed moving from lying on your back to sitting on the side of a flat bed without using bedrails?: A Little ?Help needed moving to and from a bed to a chair (including a wheelchair)?: A Little ?Help needed standing up from a chair using your arms (e.g., wheelchair or bedside chair)?: A Little ?Help needed to walk in hospital room?: A Little ?Help needed climbing 3-5 steps with a railing? : A Little ?6 Click Score: 18 ? ?  ?End of Session Equipment Utilized During Treatment: Gait belt;Left knee immobilizer ?Activity Tolerance: Patient tolerated treatment well ?Patient left: in bed;with call bell/phone within reach ?  ?PT Visit Diagnosis: Unsteadiness on feet (R26.81);Difficulty in walking, not elsewhere classified (R26.2);Pain ?Pain - Right/Left: Left ?Pain - part of body: Knee ?  ? ? ?Time: [id number] ?PT Time Calculation (min) (ACUTE ONLY): 27 min ? ?Charges:  $Gait Training: 8-22 mins ?$Therapeutic Exercise: 8-22 mins          ? ? ? ? ?Doreatha Massed, PT ?Acute Rehabilitation  ?Office: [phone] ?Pager: [phone] ? ?  ? ?

## 2021-12-29 NOTE — Plan of Care (Signed)
Problem: Education: ?Goal: Knowledge of the prescribed therapeutic regimen will improve ?Outcome: Progressing ?  ?Problem: Activity: ?Goal: Ability to avoid complications of mobility impairment will improve ?Outcome: Progressing ?  ?Problem: Pain Management: ?Goal: Pain level will decrease with appropriate interventions ?Outcome: Progressing ?  ?Haydee Salter, RN ?12/29/21 ?7:44 PM ? ?

## 2021-12-29 NOTE — Discharge Instructions (Signed)

## 2021-12-29 NOTE — Progress Notes (Signed)
Subjective: ?1 Day Post-Op Procedure(s) (LRB): ?Left TOTAL KNEE ARTHROPLASTY (Left) ?Patient reports pain as moderate.   ? ?Objective: ?Vital signs in last 24 hours: ?Temp:  [97.4 ?F (36.3 ?C)-98.4 ?F (36.9 ?C)] 97.8 ?F (36.6 ?C) (03/25 0751) ?Pulse Rate:  [49-72] 70 (03/25 0751) ?Resp:  [10-19] 18 (03/25 0751) ?BP: (108-188)/(42-116) 108/74 (03/25 0751) ?SpO2:  [95 %-100 %] 99 % (03/25 0849) ?Weight:  [104 kg] 104 kg (03/24 1750) ? ?Intake/Output from previous day: ?03/24 0701 - 03/25 0700 ?In: 2398.2 [P.O.:360; I.V.:1882.3; IV Piggyback:155.9] ?Out: 2300 [Urine:2250; Blood:50] ?Intake/Output this shift: ?No intake/output data recorded. ? ?Recent Labs  ?  12/29/21 ?0320  ?HGB 11.5*  ? ?Recent Labs  ?  12/29/21 ?0320  ?WBC 15.1*  ?RBC 4.02  ?HCT 37.0  ?PLT 244  ? ?Recent Labs  ?  12/29/21 ?0320  ?NA 134*  ?K 4.6  ?CL 105  ?CO2 24  ?BUN 20  ?CREATININE 0.94  ?GLUCOSE 168*  ?CALCIUM 8.7*  ? ?No results for input(s): LABPT, INR in the last 72 hours. ? ?Sensation intact distally ?Intact pulses distally ?Dorsiflexion/Plantar flexion intact ?Incision: dressing C/D/I ?No cellulitis present ?Compartment soft ? ? ?Assessment/Plan: ?1 Day Post-Op Procedure(s) (LRB): ?Left TOTAL KNEE ARTHROPLASTY (Left) ?Up with therapy ?Discharge to SNF hopefully by Monday. ?Consult transitional care team  ? ? ? ? ? ?Judy [name] ?12/29/2021, 8:52 AM ? ?

## 2021-12-29 NOTE — Plan of Care (Signed)
°  Problem: Education: °Goal: Knowledge of the prescribed therapeutic regimen will improve °Outcome: Progressing °  °Problem: Clinical Measurements: °Goal: Postoperative complications will be avoided or minimized °Outcome: Progressing °  °Problem: Pain Management: °Goal: Pain level will decrease with appropriate interventions °Outcome: Progressing °  °

## 2021-12-29 NOTE — Progress Notes (Signed)
Physical Therapy Treatment ?Patient Details ?Name: Judy [name] ?MRN: [id number] ?DOB: 24-Feb-1951 ?Today's Date: 12/29/2021 ? ? ?History of Present Illness Pt is 72yo female presenting s/p L-TKA on 12/28/21. PMH: OA, anxiety, depression, bipolar, AVN w/bilateral THA 2020, COPD, ? ?  ?PT Comments  ? ? Progressing with mobility. Pain rated 9/10 during session. RN in at end of session to give pain meds. Will continue to follow and progress activity as tolerated.    ?Recommendations for follow up therapy are one component of a multi-disciplinary discharge planning process, led by the attending physician.  Recommendations may be updated based on patient status, additional functional criteria and insurance authorization. ? ?Follow Up Recommendations ? Follow physician's recommendations for discharge plan and follow up therapies (plan is for ST SNF) ?  ?  ?Assistance Recommended at Discharge Frequent or constant Supervision/Assistance  ?Patient can return home with the following A little help with walking and/or transfers;A little help with bathing/dressing/bathroom;Assistance with cooking/housework;Help with stairs or ramp for entrance;Assist for transportation ?  ?Equipment Recommendations ? None recommended by PT  ?  ?Recommendations for Other Services   ? ? ?  ?Precautions / Restrictions Precautions ?Precautions: Fall ?Restrictions ?Weight Bearing Restrictions: No ?LLE Weight Bearing: Weight bearing as tolerated  ?  ? ?Mobility ? Bed Mobility ?Overal bed mobility: Needs Assistance ?Bed Mobility: Supine to Sit ?  ?  ?Supine to sit: Min assist, HOB elevated ?  ?  ?General bed mobility comments: Assist for L LE. Increased time. ?  ? ?Transfers ?Overall transfer level: Needs assistance ?Equipment used: Rolling walker (2 wheels) ?Transfers: Sit to/from Stand ?Sit to Stand: Min assist, From elevated surface ?  ?  ?  ?  ?  ?General transfer comment: Cues for safety, technique, hand/LE placement. Assist to rise, steady,  control descent. ?  ? ?Ambulation/Gait ?Ambulation/Gait assistance: Min assist ?Gait Distance (Feet): 50 Feet ?Assistive device: Rolling walker (2 wheels) ?Gait Pattern/deviations: Step-to pattern, Antalgic, Decreased stance time - left ?  ?  ?  ?General Gait Details: Cues for safety, sequence, technique. Assist to intermittently steady throughout gait. Pt denied dizziness. No buckling with KI in place. ? ? ?Stairs ?  ?  ?  ?  ?  ? ? ?Wheelchair Mobility ?  ? ?Modified Rankin (Stroke Patients Only) ?  ? ? ?  ?Balance Overall balance assessment: Needs assistance ?  ?  ?  ?  ?Standing balance support: Bilateral upper extremity supported, Reliant on assistive device for balance, During functional activity ?Standing balance-Leahy Scale: Poor ?  ?  ?  ?  ?  ?  ?  ?  ?  ?  ?  ?  ?  ? ?  ?Cognition Arousal/Alertness: Awake/alert ?Behavior During Therapy: [facility] for tasks assessed/performed ?Overall Cognitive Status: Within Functional Limits for tasks assessed ?  ?  ?  ?  ?  ?  ?  ?  ?  ?  ?  ?  ?  ?  ?  ?  ?  ?  ?  ? ?  ?Exercises   ? ?  ?General Comments   ?  ?  ? ?Pertinent Vitals/Pain Pain Assessment ?Pain Assessment: 0-10 ?Pain Score: 9  ?Pain Location: left knee ?Pain Descriptors / Indicators: Operative site guarding, Discomfort, Sore ?Pain Intervention(s): Limited activity within patient's tolerance, Monitored during session, Ice applied, Repositioned  ? ? ?Home Living   ?  ?  ?  ?  ?  ?  ?  ?  ?  ?   ?  ?  Prior Function    ?  ?  ?   ? ?PT Goals (current goals can now be found in the care plan section) Progress towards PT goals: Progressing toward goals ? ?  ?Frequency ? ? ? 7X/week ? ? ? ?  ?PT Plan Current plan remains appropriate  ? ? ?Co-evaluation   ?  ?  ?  ?  ? ?  ?AM-PAC PT "6 Clicks" Mobility   ?Outcome Measure ? Help needed turning from your back to your side while in a flat bed without using bedrails?: A Little ?Help needed moving from lying on your back to sitting on the side of a flat bed without using  bedrails?: A Little ?Help needed moving to and from a bed to a chair (including a wheelchair)?: A Little ?Help needed standing up from a chair using your arms (e.g., wheelchair or bedside chair)?: A Little ?Help needed to walk in hospital room?: A Little ?Help needed climbing 3-5 steps with a railing? : A Lot ?6 Click Score: 17 ? ?  ?End of Session Equipment Utilized During Treatment: Gait belt;Left knee immobilizer ?Activity Tolerance: Patient tolerated treatment well ?Patient left: in chair;with call bell/phone within reach;with family/visitor present ?  ?PT Visit Diagnosis: Unsteadiness on feet (R26.81);Difficulty in walking, not elsewhere classified (R26.2) ?  ? ? ?Time: 4098-1191 ?PT Time Calculation (min) (ACUTE ONLY): 27 min ? ?Charges:  $Gait Training: 23-37 mins          ?          ? ? ? ? ? ?Faye Ramsay, PT ?Acute Rehabilitation  ?Office: [phone] ?Pager: [phone] ? ?  ? ?

## 2021-12-30 DIAGNOSIS — Z79899 Other long term (current) drug therapy: Secondary | ICD-10-CM | POA: Diagnosis not present

## 2021-12-30 DIAGNOSIS — Z87891 Personal history of nicotine dependence: Secondary | ICD-10-CM | POA: Diagnosis not present

## 2021-12-30 DIAGNOSIS — F419 Anxiety disorder, unspecified: Secondary | ICD-10-CM | POA: Diagnosis present

## 2021-12-30 DIAGNOSIS — Z8249 Family history of ischemic heart disease and other diseases of the circulatory system: Secondary | ICD-10-CM | POA: Diagnosis not present

## 2021-12-30 DIAGNOSIS — J449 Chronic obstructive pulmonary disease, unspecified: Secondary | ICD-10-CM | POA: Diagnosis present

## 2021-12-30 DIAGNOSIS — Z20822 Contact with and (suspected) exposure to covid-19: Secondary | ICD-10-CM | POA: Diagnosis present

## 2021-12-30 DIAGNOSIS — F4310 Post-traumatic stress disorder, unspecified: Secondary | ICD-10-CM | POA: Diagnosis present

## 2021-12-30 DIAGNOSIS — Z825 Family history of asthma and other chronic lower respiratory diseases: Secondary | ICD-10-CM | POA: Diagnosis not present

## 2021-12-30 DIAGNOSIS — E538 Deficiency of other specified B group vitamins: Secondary | ICD-10-CM | POA: Diagnosis present

## 2021-12-30 DIAGNOSIS — M1712 Unilateral primary osteoarthritis, left knee: Secondary | ICD-10-CM | POA: Diagnosis present

## 2021-12-30 DIAGNOSIS — F319 Bipolar disorder, unspecified: Secondary | ICD-10-CM | POA: Diagnosis present

## 2021-12-30 DIAGNOSIS — Z7951 Long term (current) use of inhaled steroids: Secondary | ICD-10-CM | POA: Diagnosis not present

## 2021-12-30 DIAGNOSIS — Z96643 Presence of artificial hip joint, bilateral: Secondary | ICD-10-CM | POA: Diagnosis present

## 2021-12-30 MED ORDER — OXYCODONE HCL 5 MG PO TABS: 5.0000 mg | ORAL_TABLET | ORAL | 0 refills | Status: DC | PRN

## 2021-12-30 MED ORDER — ASPIRIN 81 MG PO CHEW: 81.0000 mg | CHEWABLE_TABLET | Freq: Two times a day (BID) | ORAL | 0 refills | Status: DC

## 2021-12-30 MED ORDER — METHOCARBAMOL 500 MG PO TABS: 500.0000 mg | ORAL_TABLET | Freq: Four times a day (QID) | ORAL | 0 refills | Status: DC | PRN

## 2021-12-30 NOTE — Progress Notes (Signed)
Physical Therapy Treatment ?Patient Details ?Name: Judy [name] ?MRN: [id number] ?DOB: 1951-06-08 ?Today's Date: 12/30/2021 ? ? ?History of Present Illness Pt is 71yo female presenting s/p L-TKA on 12/28/21. PMH: OA, anxiety, depression, bipolar, AVN w/bilateral THA 2020, COPD, ? ?  ?PT Comments  ? ? Progressing with mobility.    ?Recommendations for follow up therapy are one component of a multi-disciplinary discharge planning process, led by the attending physician.  Recommendations may be updated based on patient status, additional functional criteria and insurance authorization. ? ?Follow Up Recommendations ? Follow physician's recommendations for discharge plan and follow up therapies ?  ?  ?Assistance Recommended at Discharge Frequent or constant Supervision/Assistance  ?Patient can return home with the following A little help with walking and/or transfers;A little help with bathing/dressing/bathroom;Assistance with cooking/housework;Help with stairs or ramp for entrance;Assist for transportation ?  ?Equipment Recommendations ? None recommended by PT  ?  ?Recommendations for Other Services   ? ? ?  ?Precautions / Restrictions Precautions ?Precautions: Fall ?Restrictions ?Weight Bearing Restrictions: No ?LLE Weight Bearing: Weight bearing as tolerated  ?  ? ?Mobility ? Bed Mobility ?Overal bed mobility: Needs Assistance ?Bed Mobility: Supine to Sit ?  ?  ?Supine to sit: Min assist, HOB elevated ?Sit to supine: Min assist, HOB elevated ?  ?General bed mobility comments: Assist for L LE. Increased time. ?  ? ?Transfers ?Overall transfer level: Needs assistance ?Equipment used: Rolling walker (2 wheels) ?Transfers: Sit to/from Stand ?Sit to Stand: Min assist ?  ?  ?  ?  ?  ?General transfer comment: Cues for safety, technique, hand/LE placement. Assist to rise, steady, control descent. ?  ? ?Ambulation/Gait ?Ambulation/Gait assistance: Min assist ?Gait Distance (Feet): 75 Feet ?Assistive device: Rolling walker  (2 wheels) ?Gait Pattern/deviations: Step-to pattern, Decreased stance time - left ?  ?  ?  ?General Gait Details: Cues for safety, sequence, technique. Assist to intermittently steady throughout gait. Pt denied dizziness. No buckling with KI in place. ? ? ?Stairs ?  ?  ?  ?  ?  ? ? ?Wheelchair Mobility ?  ? ?Modified Rankin (Stroke Patients Only) ?  ? ? ?  ?Balance Overall balance assessment: Needs assistance ?  ?  ?  ?  ?Standing balance support: Bilateral upper extremity supported, Reliant on assistive device for balance, During functional activity ?  ?  ?  ?  ?  ?  ?  ?  ?  ?  ?  ?  ?  ?  ? ?  ?Cognition Arousal/Alertness: Awake/alert ?Behavior During Therapy: [facility] for tasks assessed/performed ?Overall Cognitive Status: Within Functional Limits for tasks assessed ?  ?  ?  ?  ?  ?  ?  ?  ?  ?  ?  ?  ?  ?  ?  ?  ?  ?  ?  ? ?  ?Exercises Total Joint Exercises ?Ankle Circles/Pumps: AROM, Both, 10 reps ?Quad Sets: AROM, Both, 10 reps ?Heel Slides: AAROM, Left, 10 reps ?Hip ABduction/ADduction: AAROM, Left, 10 reps ?Straight Leg Raises: AAROM, Left, 10 reps ?Goniometric ROM: ~10-60 degrees ? ?  ?General Comments   ?  ?  ? ?Pertinent Vitals/Pain Pain Assessment ?Pain Assessment: 0-10 ?Pain Score: 7  ?Pain Location: L knee/thigh ?Pain Descriptors / Indicators: Discomfort, Sore, Aching ?Pain Intervention(s): Monitored during session, Repositioned, Ice applied  ? ? ?Home Living   ?  ?  ?  ?  ?  ?  ?  ?  ?  ?   ?  ?  Prior Function    ?  ?  ?   ? ?PT Goals (current goals can now be found in the care plan section) Progress towards PT goals: Progressing toward goals ? ?  ?Frequency ? ? ? 7X/week ? ? ? ?  ?PT Plan Current plan remains appropriate  ? ? ?Co-evaluation   ?  ?  ?  ?  ? ?  ?AM-PAC PT "6 Clicks" Mobility   ?Outcome Measure ? Help needed turning from your back to your side while in a flat bed without using bedrails?: A Little ?Help needed moving from lying on your back to sitting on the side of a flat bed without using  bedrails?: A Little ?Help needed moving to and from a bed to a chair (including a wheelchair)?: A Little ?Help needed standing up from a chair using your arms (e.g., wheelchair or bedside chair)?: A Little ?Help needed to walk in hospital room?: A Little ?Help needed climbing 3-5 steps with a railing? : A Little ?6 Click Score: 18 ? ?  ?End of Session Equipment Utilized During Treatment: Gait belt;Left knee immobilizer ?Activity Tolerance: Patient tolerated treatment well ?Patient left: in chair;with call bell/phone within reach ?  ?PT Visit Diagnosis: Unsteadiness on feet (R26.81);Difficulty in walking, not elsewhere classified (R26.2);Pain ?Pain - Right/Left: Left ?Pain - part of body: Knee ?  ? ? ?Time: 9767-3419 ?PT Time Calculation (min) (ACUTE ONLY): 29 min ? ?Charges:  $Gait Training: 8-22 mins ?$Therapeutic Exercise: 8-22 mins          ?          ? ? ? ? ? ?Faye Ramsay, PT ?Acute Rehabilitation  ?Office: [phone] ?Pager: [phone] ? ?  ? ?

## 2021-12-30 NOTE — [place] FL2 (Addendum)
?[place] MEDICAID FL2 LEVEL OF CARE SCREENING TOOL  ?  ? ?IDENTIFICATION  ?Patient Name: ?Judy [name] Birthdate: Feb 11, 1951 Sex: female Admission Date (Current Location): ?12/28/2021  ?[place] Number: ? [place] ?  Facility and Address:  ?[facility],  [address], [place] [address] ?     Provider Number: ?[id number]  ?Attending Physician Name and Address:  ?[name],* ? Relative Name and Phone Number:  ?Jobe Igo Daughter [phone]  [phone] ?   ?Current Level of Care: ?Hospital Recommended Level of Care: ?Skilled Nursing Facility Prior Approval Number: ?  ? ?Date Approved/Denied: ?  PASRR Number: ?[id number] EPending ? ?Discharge Plan: ?SNF ?  ? ?Current Diagnoses: ?Patient Active Problem List  ? Diagnosis Date Noted  ? Status post total left knee replacement 12/28/2021  ? Primary osteoarthritis of left knee 10/25/2021  ? 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) 11/30/2020  ? Status post total replacement of right hip 06/04/2019  ? Status post total replacement of left hip 03/19/2019  ? PTSD (post-traumatic stress disorder)   ? COPD (chronic obstructive pulmonary disease) (HCC)   ? Bipolar 1 disorder (HCC)   ? Anxiety   ? B12 deficiency 12/09/2018  ? Hyperglycemia 04/14/2017  ? ? ?Orientation RESPIRATION BLADDER Height & Weight   ?  ?Self, Time, Situation, Place ? Normal Continent Weight: 229 lb 4.5 oz (104 kg) ?Height:  5' 9.69" (177 cm)  ?BEHAVIORAL SYMPTOMS/MOOD NEUROLOGICAL BOWEL NUTRITION STATUS  ?    Continent Diet (Regular diet)  ?AMBULATORY STATUS COMMUNICATION OF NEEDS Skin   ?Limited Assist Verbally Surgical wounds ?  ?  ?  ?    ?     ?     ? ? ?Personal Care Assistance Level of Assistance  ?Bathing, Dressing, Feeding Bathing Assistance: Limited assistance ?Feeding assistance: Independent ?Dressing Assistance: Limited assistance ?   ? ?Functional Limitations Info  ?Sight, Hearing, Speech Sight Info: Adequate ?Hearing Info:  Adequate ?Speech Info: Adequate  ? ? ?SPECIAL CARE FACTORS FREQUENCY  ?PT (By licensed PT), OT (By licensed OT)   ?  ?PT Frequency: Minimum 5x a week ?OT Frequency: Minimum 5x a week ?  ?  ?  ?   ? ? ?Contractures Contractures Info: Not present  ? ? ?Additional Factors Info  ?Code Status, Allergies, Psychotropic Code Status Info: Full Code ?Allergies Info: Celexa (Citalopram Hydrobromide)   Gabapentin ?Psychotropic Info: QUEtiapine (SEROQUEL) tablet 400 mg ?  ?  ?   ? ?Current Medications (12/31/2021):  This is the current hospital active medication list ?Current Facility-Administered Medications  ?Medication Dose Route Frequency Provider Last Rate Last Admin  ? 0.9 %  sodium chloride infusion   Intravenous Continuous [name], MD 75 mL/hr at 12/29/21 0950 New Bag at 12/29/21 0950  ? acetaminophen (TYLENOL) tablet 325-650 mg  325-650 mg Oral Q6H PRN [name], MD   650 mg at 12/30/21 0002  ? alum & mag hydroxide-simeth (MAALOX/MYLANTA) 200-200-20 MG/5ML suspension 30 mL  30 mL Oral Q4H PRN [name], MD      ? aspirin chewable tablet 81 mg  81 mg Oral BID [name], MD   81 mg at 12/31/21 0845  ? diphenhydrAMINE (BENADRYL) 12.5 MG/5ML elixir 12.5-25 mg  12.5-25 mg Oral Q4H PRN [name], MD      ? docusate sodium (COLACE) capsule 100 mg  100 mg Oral BID [name], MD   100 mg at 12/31/21 0846  ? fluticasone  furoate-vilanterol (BREO ELLIPTA) 200-25 MCG/ACT 1 puff  1 puff Inhalation Daily [name], MD   1 puff at 12/30/21 0803  ? And  ? umeclidinium bromide (INCRUSE ELLIPTA) 62.5 MCG/ACT 1 puff  1 puff Inhalation Daily [name], MD   1 puff at 12/30/21 0803  ? HYDROmorphone (DILAUDID) injection 0.5-1 mg  0.5-1 mg Intravenous Q4H PRN [name], MD   1 mg at 12/29/21 0140  ? menthol-cetylpyridinium (CEPACOL) lozenge 3 mg  1 lozenge Oral PRN [name], MD      ? Or  ? phenol  (CHLORASEPTIC) mouth spray 1 spray  1 spray Mouth/Throat PRN [name], MD      ? methocarbamol (ROBAXIN) tablet 500 mg  500 mg Oral Q6H PRN [name], MD   500 mg at 12/31/21 3818  ? Or  ? methocarbamol (ROBAXIN) 500 mg in dextrose 5 % 50 mL IVPB  500 mg Intravenous Q6H PRN [name], MD      ? metoCLOPramide (REGLAN) tablet 5-10 mg  5-10 mg Oral Q8H PRN [name], MD      ? Or  ? metoCLOPramide (REGLAN) injection 5-10 mg  5-10 mg Intravenous Q8H PRN [name], MD      ? ondansetron [facility]) tablet 4 mg  4 mg Oral Q6H PRN [name], MD      ? Or  ? ondansetron [facility]) injection 4 mg  4 mg Intravenous Q6H PRN [name], MD      ? oxyCODONE (Oxy IR/ROXICODONE) immediate release tablet 10-15 mg  10-15 mg Oral Q4H PRN [name], MD   15 mg at 12/31/21 2993  ? oxyCODONE (Oxy IR/ROXICODONE) immediate release tablet 5-10 mg  5-10 mg Oral Q4H PRN [name], MD   10 mg at 12/28/21 1901  ? pantoprazole (PROTONIX) EC tablet 40 mg  40 mg Oral Daily [name], MD   40 mg at 12/31/21 0845  ? polyethylene glycol (MIRALAX / GLYCOLAX) packet 17 g  17 g Oral Daily PRN [name], MD   17 g at 12/31/21 7169  ? predniSONE (DELTASONE) tablet 20 mg  20 mg Oral Q breakfast [name], MD   20 mg at 12/31/21 0845  ? QUEtiapine (SEROQUEL) tablet 400 mg  400 mg Oral BID [name], MD   400 mg at 12/30/21 2143  ? ? ? ?Discharge Medications: ?Please see discharge summary for a list of discharge medications. ? ?Relevant Imaging Results: ? ?Relevant Lab Results: ? ? ?Additional Information ?SSN [id number] ? ?Andersen Iorio, LCSW ? ? ? ? ?

## 2021-12-30 NOTE — Plan of Care (Signed)
°  Problem: Education: °Goal: Knowledge of the prescribed therapeutic regimen will improve °Outcome: Progressing °  °Problem: Clinical Measurements: °Goal: Postoperative complications will be avoided or minimized °Outcome: Progressing °  °Problem: Pain Management: °Goal: Pain level will decrease with appropriate interventions °Outcome: Progressing °  °

## 2021-12-30 NOTE — TOC PASRR Note (Addendum)
30 Day Passar Note ? ?RE: Judy [name]  ?Date of Birth: 1952/07/08__   ?Date: 12/30/2021    ? ?To Whom It May Concern: ? ?Please be advised that the above-named patient will require a short-term nursing home stay - anticipated 30 days or less for rehabilitation and strengthening.  The plan is for return home. ? ? ?Evette Cristal, MSW, LCSW ?[phone] ? ? ?

## 2021-12-30 NOTE — TOC Progression Note (Addendum)
Transition of Care (TOC) - Progression Note  ? ? ?Patient Details  ?Name: Judy [name] ?MRN: [id number] ?Date of Birth: 10-Feb-1951 ? ?Transition of Care (TOC) CM/SW Contact  ?Darleene Cleaver, LCSW ?Phone Number: ?12/30/2021, 6:24 PM ? ?Clinical Narrative:    ? ?Patient is a Level 2 [facility] screen 30 day note and FL2 needs to signed by physician and CSW will send to [facility].  Patient has been faxed out awaiting bed offers. ? ?CSW attempted to contact patient's daughter to SNF placement, had to leave a message on voice mail, waiting for call back. ? ? ?  ?  ? ?Expected Discharge Plan and Services ?  ?  ?  ?  ?  ?                ?  ?  ?  ?  ?  ?  ?  ?  ?  ?  ? ? ?Social Determinants of Health (SDOH) Interventions ?  ? ?Readmission Risk Interventions ?   ? View : No data to display.  ?  ?  ?  ? ? ?

## 2021-12-30 NOTE — Progress Notes (Signed)
Patient ID: Judy [name], female   DOB: 1950-11-21, 71 y.o.   MRN: [id number] ?The patient is awake and alert.  Her left operative knee is stable.  She is making some progress with her mobility and PT.  The plan is for short-term skilled nursing following this acute hospitalization.  Hopefully this can happen by tomorrow.  The transitional care team has been consulted. ?

## 2021-12-31 NOTE — Progress Notes (Signed)
Patient ID: Judy [name], female   DOB: Jun 04, 1951, 71 y.o.   MRN: [id number] ?No acute changes.  Left operative knee stable.  Vitals stable.  Can be discharged to short-tern SNF today. ?

## 2021-12-31 NOTE — Discharge Summary (Signed)
?Patient ID: ?Judy [name] ?MRN: [id number] ?DOB/AGE: 1951/03/14 71 y.o. ? ?Admit date: 12/28/2021 ?Discharge date: 12/31/2021 ? ?Admission Diagnoses:  ?Principal Problem: ?  Primary osteoarthritis of left knee ?Active Problems: ?  Status post total left knee replacement ? ? ?Discharge Diagnoses:  ?Same ? ?Past Medical History:  ?Diagnosis Date  ? Anxiety   ? Arthritis   ? Avascular necrosis ([place])   ? Left hip  ? Avascular necrosis of bone of hip, left ([place]) 12/28/2018  ? 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 Bipolar 1 disorder ([place])   ? COPD (chronic obstructive pulmonary disease) ([place])   ? Depression   ? Dyspnea   ? Pneumonia   ? PTSD (post-traumatic stress disorder)   ? ? ?Surgeries: Procedure(s): ?Left TOTAL KNEE ARTHROPLASTY on 12/28/2021 ?  ?Consultants:  ? ?Discharged Condition: Improved ? ?Hospital Course: Judy [name] is an 71 y.o. female who was admitted 12/28/2021 for operative treatment ofPrimary osteoarthritis of left knee. Patient has severe unremitting pain that affects sleep, daily activities, and work/hobbies. After pre-op clearance the patient was taken to the operating room on 12/28/2021 and underwent  Procedure(s): ?Left TOTAL KNEE ARTHROPLASTY.   ? ?Patient was given perioperative antibiotics:  ?Anti-infectives (From admission, onward)  ? ? Start     Dose/Rate Route Frequency Ordered Stop  ? 12/28/21 2000  ceFAZolin (ANCEF) IVPB 1 g/50 mL premix       ? 1 g ?100 mL/hr over 30 Minutes Intravenous Every 6 hours 12/28/21 1752 12/29/21 0210  ? 12/28/21 1307  ceFAZolin (ANCEF) 2-4 GM/100ML-% IVPB       ?Note to Pharmacy: Mardelle Matte R: cabinet override  ?    12/28/21 1307 12/28/21 1424  ? 12/28/21 1145  ceFAZolin (ANCEF) IVPB 2g/100 mL premix       ? 2 g ?200 mL/hr over 30 Minutes Intravenous On call to O.R. 12/28/21 1135 12/28/21 1419  ? ?  ?  ? ?Patient was given sequential compression devices, early ambulation, and chemoprophylaxis to prevent DVT. ? ?Patient  benefited maximally from hospital stay and there were no complications.   ? ?Recent vital signs: Patient Vitals for the past 24 hrs: ? BP Temp Temp src Pulse Resp SpO2  ?12/31/21 0704 107/69 98.1 ?F (36.7 ?C) Oral 74 16 95 %  ?12/30/21 2241 (!) 144/77 98.8 ?F (37.1 ?C) Oral 88 18 100 %  ?12/30/21 1312 (!) 158/75 98 ?F (36.7 ?C) Oral 84 20 100 %  ?  ? ?Recent laboratory studies:  ?Recent Labs  ?  12/29/21 ?0320  ?WBC 15.1*  ?HGB 11.5*  ?HCT 37.0  ?PLT 244  ?NA 134*  ?K 4.6  ?CL 105  ?CO2 24  ?BUN 20  ?CREATININE 0.94  ?GLUCOSE 168*  ?CALCIUM 8.7*  ? ? ? ?Discharge Medications:   ?Allergies as of 12/31/2021   ? ?   Reactions  ? Celexa [citalopram Hydrobromide]   ? "goes weird"  ? Gabapentin   ? "goes weird"  ?Other reaction(s): hall  ? ?  ? ?  ?Medication List  ?  ? ?STOP taking these medications   ? ?predniSONE 10 MG tablet ?Commonly known as: DELTASONE ?  ? ?  ? ?TAKE these medications   ? ?aspirin 81 MG chewable tablet ?Chew 1 tablet (81 mg total) by mouth 2 (two) times daily. ?  ?methocarbamol 500 MG tablet ?Commonly known as: ROBAXIN ?Take 1 tablet (500 mg total) by mouth every 6 (six) hours as needed for  muscle spasms. ?  ?oxyCODONE 5 MG immediate release tablet ?Commonly known as: Oxy IR/ROXICODONE ?Take 1-2 tablets (5-10 mg total) by mouth every 4 (four) hours as needed for moderate pain (pain score 4-6). ?  ?QUEtiapine 400 MG tablet ?Commonly known as: SEROQUEL ?Take 400 mg by mouth 2 (two) times daily. ?  ?Trelegy Ellipta 200-62.5-25 MCG/ACT Aepb ?Generic drug: Fluticasone-Umeclidin-Vilant ?Inhale 1 puff into the lungs daily. ?  ?Trelegy Ellipta 200-62.5-25 MCG/ACT Aepb ?Generic drug: Fluticasone-Umeclidin-Vilant ?Inhale 1 puff into the lungs daily. ?  ?VITAMIN B-12 PO ?Take 1 tablet by mouth in the morning. ?  ?VITAMIN D3 PO ?Take 1 tablet by mouth in the morning. ?  ? ?  ? ?  ?  ? ? ?  ?Durable Medical Equipment  ?(From admission, onward)  ?  ? ? ?  ? ?  Start     Ordered  ? 12/28/21 1752  DME 3 n 1  Once        ? 12/28/21 1752  ? 12/28/21 1752  DME Walker rolling  Once       ?Question Answer Comment  ?Walker: With 5 Inch Wheels   ?Patient needs a walker to treat with the following condition Status post total left knee replacement   ?  ? 12/28/21 1752  ? ?  ?  ? ?  ? ? ?Diagnostic Studies: DG Knee Left Port ? ?Result Date: 12/28/2021 ?CLINICAL DATA:  Postop from left knee arthroplasty. EXAM: PORTABLE LEFT KNEE-  2 VIEW COMPARISON:  None. FINDINGS: Total knee arthroplasty is seen with all 3 components in expected position. No evidence of fracture or dislocation. Soft tissue gas and overlying skin staples noted. IMPRESSION: Expected postoperative appearance of left knee arthroplasty. No evidence of fracture or dislocation. Electronically Signed   By: Marlaine Hind M.D.   On: 12/28/2021 16:32   ? ?Disposition: Discharge disposition: 03-Skilled Nursing Facility ? ? ? ? ?The patient is to be admitted to a skilled nursing facility without the 3 night qualified hospital stay under the current Medicare waiver ? ? ? ?Discharge Instructions   ? ? Discharge patient   Complete by: As directed ?  ? Discharge disposition: 03-Skilled Nursing Facility  ? Discharge patient date: 12/31/2021  ? Discharge patient   Complete by: As directed ?  ? Discharge disposition: 03-Skilled Nursing Facility  ? Discharge patient date: 12/31/2021  ? ?  ? ? ? Contact information for follow-up providers   ? ? Mcarthur Rossetti, MD Follow up in 2 week(s).   ?Specialty: Orthopedic Surgery ?Contact information: ?[address] ?[place] [address] ?[phone] ? ? ?  ?  ? ?  ?  ? ? Contact information for after-discharge care   ? ? Destination   ? ? [facility] Preferred SNF .   ?Service: Skilled Nursing ?Contact information: ?[address] ?[place] ?[phone] ? ?  ?  ? ?  ?  ? ?  ?  ? ?  ? ? ? ?Signed: ?Mcarthur Rossetti ?12/31/2021, 12:37 PM ? ? ? ?

## 2021-12-31 NOTE — Discharge Summary (Signed)
?Patient ID: ?Judy [name] ?MRN: [id number] ?DOB/AGE: 04/05/1951 71 y.o. ? ?Admit date: 12/28/2021 ?Discharge date: 12/31/2021 ? ?Admission Diagnoses:  ?Principal Problem: ?  Primary osteoarthritis of left knee ?Active Problems: ?  Status post total left knee replacement ? ? ?Discharge Diagnoses:  ?Same ? ?Past Medical History:  ?Diagnosis Date  ? Anxiety   ? Arthritis   ? Avascular necrosis ([place])   ? Left hip  ? Avascular necrosis of bone of hip, left ([place]) 12/28/2018  ? 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 Bipolar 1 disorder ([place])   ? COPD (chronic obstructive pulmonary disease) ([place])   ? Depression   ? Dyspnea   ? Pneumonia   ? PTSD (post-traumatic stress disorder)   ? ? ?Surgeries: Procedure(s): ?Left TOTAL KNEE ARTHROPLASTY on 12/28/2021 ?  ?Consultants:  ? ?Discharged Condition: Improved ? ?Hospital Course: Judy [name] is an 71 y.o. female who was admitted 12/28/2021 for operative treatment ofPrimary osteoarthritis of left knee. Patient has severe unremitting pain that affects sleep, daily activities, and work/hobbies. After pre-op clearance the patient was taken to the operating room on 12/28/2021 and underwent  Procedure(s): ?Left TOTAL KNEE ARTHROPLASTY.   ? ?Patient was given perioperative antibiotics:  ?Anti-infectives (From admission, onward)  ? ? Start     Dose/Rate Route Frequency Ordered Stop  ? 12/28/21 2000  ceFAZolin (ANCEF) IVPB 1 g/50 mL premix       ? 1 g ?100 mL/hr over 30 Minutes Intravenous Every 6 hours 12/28/21 1752 12/29/21 0210  ? 12/28/21 1307  ceFAZolin (ANCEF) 2-4 GM/100ML-% IVPB       ?Note to Pharmacy: Mardelle Matte R: cabinet override  ?    12/28/21 1307 12/28/21 1424  ? 12/28/21 1145  ceFAZolin (ANCEF) IVPB 2g/100 mL premix       ? 2 g ?200 mL/hr over 30 Minutes Intravenous On call to O.R. 12/28/21 1135 12/28/21 1419  ? ?  ?  ? ?Patient was given sequential compression devices, early ambulation, and chemoprophylaxis to prevent DVT. ? ?Patient  benefited maximally from hospital stay and there were no complications.   ? ?Recent vital signs: Patient Vitals for the past 24 hrs: ? BP Temp Temp src Pulse Resp SpO2  ?12/31/21 0704 107/69 98.1 ?F (36.7 ?C) Oral 74 16 95 %  ?12/30/21 2241 (!) 144/77 98.8 ?F (37.1 ?C) Oral 88 18 100 %  ?12/30/21 1312 (!) 158/75 98 ?F (36.7 ?C) Oral 84 20 100 %  ?  ? ?Recent laboratory studies:  ?Recent Labs  ?  12/29/21 ?0320  ?WBC 15.1*  ?HGB 11.5*  ?HCT 37.0  ?PLT 244  ?NA 134*  ?K 4.6  ?CL 105  ?CO2 24  ?BUN 20  ?CREATININE 0.94  ?GLUCOSE 168*  ?CALCIUM 8.7*  ? ? ? ?Discharge Medications:   ?Allergies as of 12/31/2021   ? ?   Reactions  ? Celexa [citalopram Hydrobromide]   ? "goes weird"  ? Gabapentin   ? "goes weird"  ?Other reaction(s): hall  ? ?  ? ?  ?Medication List  ?  ? ?TAKE these medications   ? ?aspirin 81 MG chewable tablet ?Chew 1 tablet (81 mg total) by mouth 2 (two) times daily. ?  ?methocarbamol 500 MG tablet ?Commonly known as: ROBAXIN ?Take 1 tablet (500 mg total) by mouth every 6 (six) hours as needed for muscle spasms. ?  ?oxyCODONE 5 MG immediate release tablet ?Commonly known as: Oxy IR/ROXICODONE ?Take 1-2 tablets (5-10 mg total)  by mouth every 4 (four) hours as needed for moderate pain (pain score 4-6). ?  ?predniSONE 10 MG tablet ?Commonly known as: DELTASONE ?Start 40mg  daily then reduce 10mg  every 3 days until 20mg  daily ?  ?QUEtiapine 400 MG tablet ?Commonly known as: SEROQUEL ?Take 400 mg by mouth 2 (two) times daily. ?  ?Trelegy Ellipta 200-62.5-25 MCG/ACT Aepb ?Generic drug: Fluticasone-Umeclidin-Vilant ?Inhale 1 puff into the lungs daily. ?  ?Trelegy Ellipta 200-62.5-25 MCG/ACT Aepb ?Generic drug: Fluticasone-Umeclidin-Vilant ?Inhale 1 puff into the lungs daily. ?  ?VITAMIN B-12 PO ?Take 1 tablet by mouth in the morning. ?  ?VITAMIN D3 PO ?Take 1 tablet by mouth in the morning. ?  ? ?  ? ?  ?  ? ? ?  ?Durable Medical Equipment  ?(From admission, onward)  ?  ? ? ?  ? ?  Start     Ordered  ? 12/28/21  1752  DME 3 n 1  Once       ? 12/28/21 1752  ? 12/28/21 1752  DME Walker rolling  Once       ?Question Answer Comment  ?Walker: With 5 Inch Wheels   ?Patient needs a walker to treat with the following condition Status post total left knee replacement   ?  ? 12/28/21 1752  ? ?  ?  ? ?  ? ? ?Diagnostic Studies: DG Knee Left Port ? ?Result Date: 12/28/2021 ?CLINICAL DATA:  Postop from left knee arthroplasty. EXAM: PORTABLE LEFT KNEE-  2 VIEW COMPARISON:  None. FINDINGS: Total knee arthroplasty is seen with all 3 components in expected position. No evidence of fracture or dislocation. Soft tissue gas and overlying skin staples noted. IMPRESSION: Expected postoperative appearance of left knee arthroplasty. No evidence of fracture or dislocation. Electronically Signed   By: 12/30/21 M.D.   On: 12/28/2021 16:32   ? ?Disposition: Discharge disposition: 03-Skilled Nursing Facility ? ? ? ? ? ? ?Discharge Instructions   ? ? Discharge patient   Complete by: As directed ?  ? Discharge disposition: 03-Skilled Nursing Facility  ? Discharge patient date: 12/31/2021  ? ?  ? ? ? Follow-up Information   ? ? Danae Orleans, MD Follow up in 2 week(s).   ?Specialty: Orthopedic Surgery ?Contact information: ?[address] ?[name] [address] ?[phone] ? ? ?  ?  ? ?  ?  ? ?  ? ? ? ?Signed: ?[place] ?12/31/2021, 7:34 AM ? ?  ?

## 2021-12-31 NOTE — TOC Transition Note (Signed)
Transition of Care (TOC) - CM/SW Discharge Note ? ? ?Patient Details  ?Name: Judy [name] ?MRN: [id number] ?Date of Birth: Oct 30, 1950 ? ?Transition of Care (TOC) CM/SW Contact:  ?Tinleigh Whitmire, LCSW ?Phone Number: ?12/31/2021, 11:46 AM ? ? ?Clinical Narrative:    ? ?Pt medically cleared for dc today to SNF.  Have received PASRR and pt has accepted bed at [facility] who can admit today. Pt aware and agreeable and VM left for daughter.  PTAR called at 11:45am.  RN to call report to [phone].  No further TOC needs. ? ?Final next level of care: [place] ?Barriers to Discharge: Barriers Resolved ? ? ?Patient Goals and CMS Choice ?Patient states their goals for this hospitalization and ongoing recovery are:: return home following SNF rehab ?  ?  ? ?Discharge Placement ?PASRR number recieved: 12/31/21 ?           ?Patient chooses bed at: [facility] ?Patient to be transferred to facility by: PTAR ?Name of family member notified: VM left for daughter ?Patient and family notified of of transfer: 12/31/21 ? ?Discharge Plan and Services ?  ?  ?           ?DME Arranged: N/A ?DME Agency: NA ?  ?  ?  ?  ?  ?  ?  ?  ? ?Social Determinants of Health (SDOH) Interventions ?  ? ? ?Readmission Risk Interventions ?   ? View : No data to display.  ?  ?  ?  ? ? ? ? ? ?

## 2021-12-31 NOTE — Discharge Summary (Signed)
?Patient ID: ?Judy [name] ?MRN: [id number] ?DOB/AGE: 04/05/1951 71 y.o. ? ?Admit date: 12/28/2021 ?Discharge date: 12/31/2021 ? ?Admission Diagnoses:  ?Principal Problem: ?  Primary osteoarthritis of left knee ?Active Problems: ?  Status post total left knee replacement ? ? ?Discharge Diagnoses:  ?Same ? ?Past Medical History:  ?Diagnosis Date  ? Anxiety   ? Arthritis   ? Avascular necrosis ([place])   ? Left hip  ? Avascular necrosis of bone of hip, left ([place]) 12/28/2018  ? 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 Bipolar 1 disorder ([place])   ? COPD (chronic obstructive pulmonary disease) ([place])   ? Depression   ? Dyspnea   ? Pneumonia   ? PTSD (post-traumatic stress disorder)   ? ? ?Surgeries: Procedure(s): ?Left TOTAL KNEE ARTHROPLASTY on 12/28/2021 ?  ?Consultants:  ? ?Discharged Condition: Improved ? ?Hospital Course: ESA CADD is an 71 y.o. female who was admitted 12/28/2021 for operative treatment ofPrimary osteoarthritis of left knee. Patient has severe unremitting pain that affects sleep, daily activities, and work/hobbies. After pre-op clearance the patient was taken to the operating room on 12/28/2021 and underwent  Procedure(s): ?Left TOTAL KNEE ARTHROPLASTY.   ? ?Patient was given perioperative antibiotics:  ?Anti-infectives (From admission, onward)  ? ? Start     Dose/Rate Route Frequency Ordered Stop  ? 12/28/21 2000  ceFAZolin (ANCEF) IVPB 1 g/50 mL premix       ? 1 g ?100 mL/hr over 30 Minutes Intravenous Every 6 hours 12/28/21 1752 12/29/21 0210  ? 12/28/21 1307  ceFAZolin (ANCEF) 2-4 GM/100ML-% IVPB       ?Note to Pharmacy: Mardelle Matte R: cabinet override  ?    12/28/21 1307 12/28/21 1424  ? 12/28/21 1145  ceFAZolin (ANCEF) IVPB 2g/100 mL premix       ? 2 g ?200 mL/hr over 30 Minutes Intravenous On call to O.R. 12/28/21 1135 12/28/21 1419  ? ?  ?  ? ?Patient was given sequential compression devices, early ambulation, and chemoprophylaxis to prevent DVT. ? ?Patient  benefited maximally from hospital stay and there were no complications.   ? ?Recent vital signs: Patient Vitals for the past 24 hrs: ? BP Temp Temp src Pulse Resp SpO2  ?12/31/21 0704 107/69 98.1 ?F (36.7 ?C) Oral 74 16 95 %  ?12/30/21 2241 (!) 144/77 98.8 ?F (37.1 ?C) Oral 88 18 100 %  ?12/30/21 1312 (!) 158/75 98 ?F (36.7 ?C) Oral 84 20 100 %  ?  ? ?Recent laboratory studies:  ?Recent Labs  ?  12/29/21 ?0320  ?WBC 15.1*  ?HGB 11.5*  ?HCT 37.0  ?PLT 244  ?NA 134*  ?K 4.6  ?CL 105  ?CO2 24  ?BUN 20  ?CREATININE 0.94  ?GLUCOSE 168*  ?CALCIUM 8.7*  ? ? ? ?Discharge Medications:   ?Allergies as of 12/31/2021   ? ?   Reactions  ? Celexa [citalopram Hydrobromide]   ? "goes weird"  ? Gabapentin   ? "goes weird"  ?Other reaction(s): hall  ? ?  ? ?  ?Medication List  ?  ? ?TAKE these medications   ? ?aspirin 81 MG chewable tablet ?Chew 1 tablet (81 mg total) by mouth 2 (two) times daily. ?  ?methocarbamol 500 MG tablet ?Commonly known as: ROBAXIN ?Take 1 tablet (500 mg total) by mouth every 6 (six) hours as needed for muscle spasms. ?  ?oxyCODONE 5 MG immediate release tablet ?Commonly known as: Oxy IR/ROXICODONE ?Take 1-2 tablets (5-10 mg total)  by mouth every 4 (four) hours as needed for moderate pain (pain score 4-6). ?  ?predniSONE 10 MG tablet ?Commonly known as: DELTASONE ?Start 40mg  daily then reduce 10mg  every 3 days until 20mg  daily ?  ?QUEtiapine 400 MG tablet ?Commonly known as: SEROQUEL ?Take 400 mg by mouth 2 (two) times daily. ?  ?Trelegy Ellipta 200-62.5-25 MCG/ACT Aepb ?Generic drug: Fluticasone-Umeclidin-Vilant ?Inhale 1 puff into the lungs daily. ?  ?Trelegy Ellipta 200-62.5-25 MCG/ACT Aepb ?Generic drug: Fluticasone-Umeclidin-Vilant ?Inhale 1 puff into the lungs daily. ?  ?VITAMIN B-12 PO ?Take 1 tablet by mouth in the morning. ?  ?VITAMIN D3 PO ?Take 1 tablet by mouth in the morning. ?  ? ?  ? ?  ?  ? ? ?  ?Durable Medical Equipment  ?(From admission, onward)  ?  ? ? ?  ? ?  Start     Ordered  ? 12/28/21  1752  DME 3 n 1  Once       ? 12/28/21 1752  ? 12/28/21 1752  DME Walker rolling  Once       ?Question Answer Comment  ?Walker: With 5 Inch Wheels   ?Patient needs a walker to treat with the following condition Status post total left knee replacement   ?  ? 12/28/21 1752  ? ?  ?  ? ?  ? ? ?Diagnostic Studies: DG Knee Left Port ? ?Result Date: 12/28/2021 ?CLINICAL DATA:  Postop from left knee arthroplasty. EXAM: PORTABLE LEFT KNEE-  2 VIEW COMPARISON:  None. FINDINGS: Total knee arthroplasty is seen with all 3 components in expected position. No evidence of fracture or dislocation. Soft tissue gas and overlying skin staples noted. IMPRESSION: Expected postoperative appearance of left knee arthroplasty. No evidence of fracture or dislocation. Electronically Signed   By: Marlaine Hind M.D.   On: 12/28/2021 16:32   ? ?Disposition: Discharge disposition: 03-Skilled Nursing Facility ? ? ? ? ?The patient is to be admitted to a skilled nursing facility without the 3 night qualified hospital stay under the current Medicare waiver. ? ? ? ? ? ? ?Discharge Instructions   ? ? Discharge patient   Complete by: As directed ?  ? Discharge disposition: 03-Skilled Nursing Facility  ? Discharge patient date: 12/31/2021  ? ?  ? ? ? Follow-up Information   ? ? Mcarthur Rossetti, MD Follow up in 2 week(s).   ?Specialty: Orthopedic Surgery ?Contact information: ?[address] ?[place] [address] ?[phone] ? ? ?  ?  ? ?  ?  ? ?  ? ? ? ?Signed: ?Mcarthur Rossetti ?12/31/2021, 11:01 AM ? ?  ?

## 2021-12-31 NOTE — Plan of Care (Signed)
Patient discharging to [facility] today via ambulance. ?Ivan Anchors, RN ?12/31/21 ?12:39 PM ? ?

## 2021-12-31 NOTE — Progress Notes (Signed)
Physical Therapy Treatment ?Patient Details ?Name: Judy [name] ?MRN: [id number] ?DOB: May 31, 1951 ?Today's Date: 12/31/2021 ? ? ?History of Present Illness Pt is 71yo female presenting s/p L-TKA on 12/28/21. PMH: OA, anxiety, depression, bipolar, AVN w/bilateral THA 2020, COPD, ? ?  ?PT Comments  ? ? Progressing with mobility. Plan is for [facility].    ?Recommendations for follow up therapy are one component of a multi-disciplinary discharge planning process, led by the attending physician.  Recommendations may be updated based on patient status, additional functional criteria and insurance authorization. ? ?Follow Up Recommendations ? Follow physician's recommendations for discharge plan and follow up therapies ?  ?  ?Assistance Recommended at Discharge Frequent or constant Supervision/Assistance  ?Patient can return home with the following A little help with walking and/or transfers;A little help with bathing/dressing/bathroom;Assistance with cooking/housework;Help with stairs or ramp for entrance;Assist for transportation ?  ?Equipment Recommendations ? None recommended by PT  ?  ?Recommendations for Other Services   ? ? ?  ?Precautions / Restrictions Precautions ?Precautions: Fall ?Restrictions ?Weight Bearing Restrictions: No ?LLE Weight Bearing: Weight bearing as tolerated  ?  ? ?Mobility ? Bed Mobility ?Overal bed mobility: Needs Assistance ?Bed Mobility: Supine to Sit ?  ?  ?Supine to sit: Min assist, HOB elevated ?  ?  ?General bed mobility comments: Assist for L LE. Increased time. ?  ? ?Transfers ?Overall transfer level: Needs assistance ?Equipment used: Rolling walker (2 wheels) ?Transfers: Sit to/from Stand ?  ?  ?  ?  ?  ?  ?General transfer comment: Cues for safety, technique, hand/LE placement. Assist to rise, steady, control descent. ?  ? ?Ambulation/Gait ?Ambulation/Gait assistance: Min guard ?Gait Distance (Feet): 100 Feet ?Assistive device: Rolling walker (2 wheels) ?Gait Pattern/deviations:  Step-to pattern, Decreased stance time - left ?  ?  ?  ?General Gait Details: Cues for safety, sequence, technique. Assist to intermittently steady throughout gait. Pt denied dizziness. No buckling with KI in place. ? ? ?Stairs ?  ?  ?  ?  ?  ? ? ?Wheelchair Mobility ?  ? ?Modified Rankin (Stroke Patients Only) ?  ? ? ?  ?Balance Overall balance assessment: Needs assistance ?  ?  ?  ?  ?Standing balance support: Bilateral upper extremity supported, Reliant on assistive device for balance, During functional activity ?Standing balance-Leahy Scale: Poor ?  ?  ?  ?  ?  ?  ?  ?  ?  ?  ?  ?  ?  ? ?  ?Cognition Arousal/Alertness: Awake/alert ?Behavior During Therapy: [facility] for tasks assessed/performed ?Overall Cognitive Status: Within Functional Limits for tasks assessed ?  ?  ?  ?  ?  ?  ?  ?  ?  ?  ?  ?  ?  ?  ?  ?  ?  ?  ?  ? ?  ?Exercises Total Joint Exercises ?Ankle Circles/Pumps: AROM, Both, 10 reps ?Quad Sets: AROM, Both, 10 reps ?Short Arc Quad: AAROM, Left, 10 reps ?Heel Slides: AAROM, Left, 10 reps ?Hip ABduction/ADduction: AAROM, Left, 10 reps ?Straight Leg Raises: AAROM, Left, 10 reps (has ext lag) ?Goniometric ROM: ~10-60 degrees ? ?  ?General Comments   ?  ?  ? ?Pertinent Vitals/Pain Pain Assessment ?Pain Assessment: 0-10 ?Pain Score: 6  ?Pain Location: L knee/thigh ?Pain Descriptors / Indicators: Discomfort, Sore, Aching ?Pain Intervention(s): Limited activity within patient's tolerance, Monitored during session, Ice applied, Repositioned  ? ? ?Home Living   ?  ?  ?  ?  ?  ?  ?  ?  ?  ?   ?  ?  Prior Function    ?  ?  ?   ? ?PT Goals (current goals can now be found in the care plan section) Progress towards PT goals: Progressing toward goals ? ?  ?Frequency ? ? ? 7X/week ? ? ? ?  ?PT Plan Current plan remains appropriate  ? ? ?Co-evaluation   ?  ?  ?  ?  ? ?  ?AM-PAC PT "6 Clicks" Mobility   ?Outcome Measure ? Help needed turning from your back to your side while in a flat bed without using bedrails?: A  Little ?Help needed moving from lying on your back to sitting on the side of a flat bed without using bedrails?: A Little ?Help needed moving to and from a bed to a chair (including a wheelchair)?: A Little ?Help needed standing up from a chair using your arms (e.g., wheelchair or bedside chair)?: A Little ?Help needed to walk in hospital room?: A Little ?Help needed climbing 3-5 steps with a railing? : A Little ?6 Click Score: 18 ? ?  ?End of Session Equipment Utilized During Treatment: Gait belt;Left knee immobilizer ?Activity Tolerance: Patient tolerated treatment well ?Patient left: in chair;with call bell/phone within reach ?  ?PT Visit Diagnosis: Unsteadiness on feet (R26.81);Difficulty in walking, not elsewhere classified (R26.2);Pain ?Pain - Right/Left: Left ?Pain - part of body: Knee ?  ? ? ?Time: 2951-8841 ?PT Time Calculation (min) (ACUTE ONLY): 25 min ? ?Charges:  $Gait Training: 8-22 mins ?$Therapeutic Exercise: 8-22 mins          ? ? ? ? ?Faye Ramsay, PT ?Acute Rehabilitation  ?Office: [phone] ?Pager: [phone] ? ?  ? ?

## 2022-01-01 ENCOUNTER — Encounter (HOSPITAL_COMMUNITY): Payer: Self-pay | Admitting: Orthopaedic Surgery

## 2022-01-10 ENCOUNTER — Encounter: Payer: Self-pay | Admitting: Orthopaedic Surgery

## 2022-01-10 ENCOUNTER — Ambulatory Visit (INDEPENDENT_AMBULATORY_CARE_PROVIDER_SITE_OTHER): Payer: Medicare Other | Admitting: Orthopaedic Surgery

## 2022-01-10 DIAGNOSIS — Z96652 Presence of left artificial knee joint: Secondary | ICD-10-CM

## 2022-01-10 NOTE — Progress Notes (Signed)
The patient is 2 weeks status post a left total knee arthroplasty.  She is staying in a skilled nursing facility as she recovers from surgery.  She is 71 years old and did not have any assistance at home.  She feels like she is making progress and she says the rehab therapist at her skilled nursing facility said she is doing well. ? ?She is in an immobilizer today and I would have them stop the knee immobilizer completely.  Her extension is full and I was able to flex her to almost 90 degrees.  Her incision looks good so the staples were removed and Steri-Strips applied.  Calf is soft. ? ?She will go down to just 1 aspirin daily.  Hopefully they can transition her to home with home health therapy soon and then we can set up outpatient physical therapy.  She understands that when she is home she will call us for pain medications.  All questions and concerns were answered and addressed.  We will see her back in 4 weeks no x-rays are needed. ?

## 2022-01-22 ENCOUNTER — Telehealth: Payer: Self-pay | Admitting: Orthopaedic Surgery

## 2022-01-22 NOTE — Telephone Encounter (Signed)
Please advise 

## 2022-01-22 NOTE — Telephone Encounter (Signed)
Patient called asked when will she be able to drive? The number to contact patient is [phone] ?

## 2022-01-22 NOTE — Telephone Encounter (Signed)
Called and advised pt. She stated understanding  

## 2022-01-30 ENCOUNTER — Ambulatory Visit: Payer: Medicare Other

## 2022-01-30 ENCOUNTER — Telehealth: Payer: Self-pay

## 2022-01-30 NOTE — Telephone Encounter (Signed)
Unsuccessful attempt to reach patient on preferred number listed in notes for scheduled AWV. Left message on voicemail okay to reschedule. 

## 2022-02-01 ENCOUNTER — Ambulatory Visit (INDEPENDENT_AMBULATORY_CARE_PROVIDER_SITE_OTHER): Payer: Medicare Other | Admitting: Family Medicine

## 2022-02-01 ENCOUNTER — Encounter: Payer: Self-pay | Admitting: Family Medicine

## 2022-02-01 ENCOUNTER — Ambulatory Visit (INDEPENDENT_AMBULATORY_CARE_PROVIDER_SITE_OTHER): Payer: Medicare Other

## 2022-02-01 VITALS — BP 102/60 | HR 81 | Temp 98.2°F | Ht 70.0 in

## 2022-02-01 DIAGNOSIS — R531 Weakness: Secondary | ICD-10-CM

## 2022-02-01 DIAGNOSIS — E538 Deficiency of other specified B group vitamins: Secondary | ICD-10-CM

## 2022-02-01 DIAGNOSIS — R0902 Hypoxemia: Secondary | ICD-10-CM

## 2022-02-01 DIAGNOSIS — J441 Chronic obstructive pulmonary disease with (acute) exacerbation: Secondary | ICD-10-CM | POA: Diagnosis not present

## 2022-02-01 DIAGNOSIS — R051 Acute cough: Secondary | ICD-10-CM

## 2022-02-01 DIAGNOSIS — R062 Wheezing: Secondary | ICD-10-CM | POA: Diagnosis not present

## 2022-02-01 DIAGNOSIS — E559 Vitamin D deficiency, unspecified: Secondary | ICD-10-CM

## 2022-02-01 IMAGING — DX DG CHEST 2V
2 series · 2 of 2 positions shown · non-contrast
Comparison: [DATE]

CLINICAL DATA: Cough and shortness of breath.

EXAM:
CHEST - 2 VIEW

[chest pa]
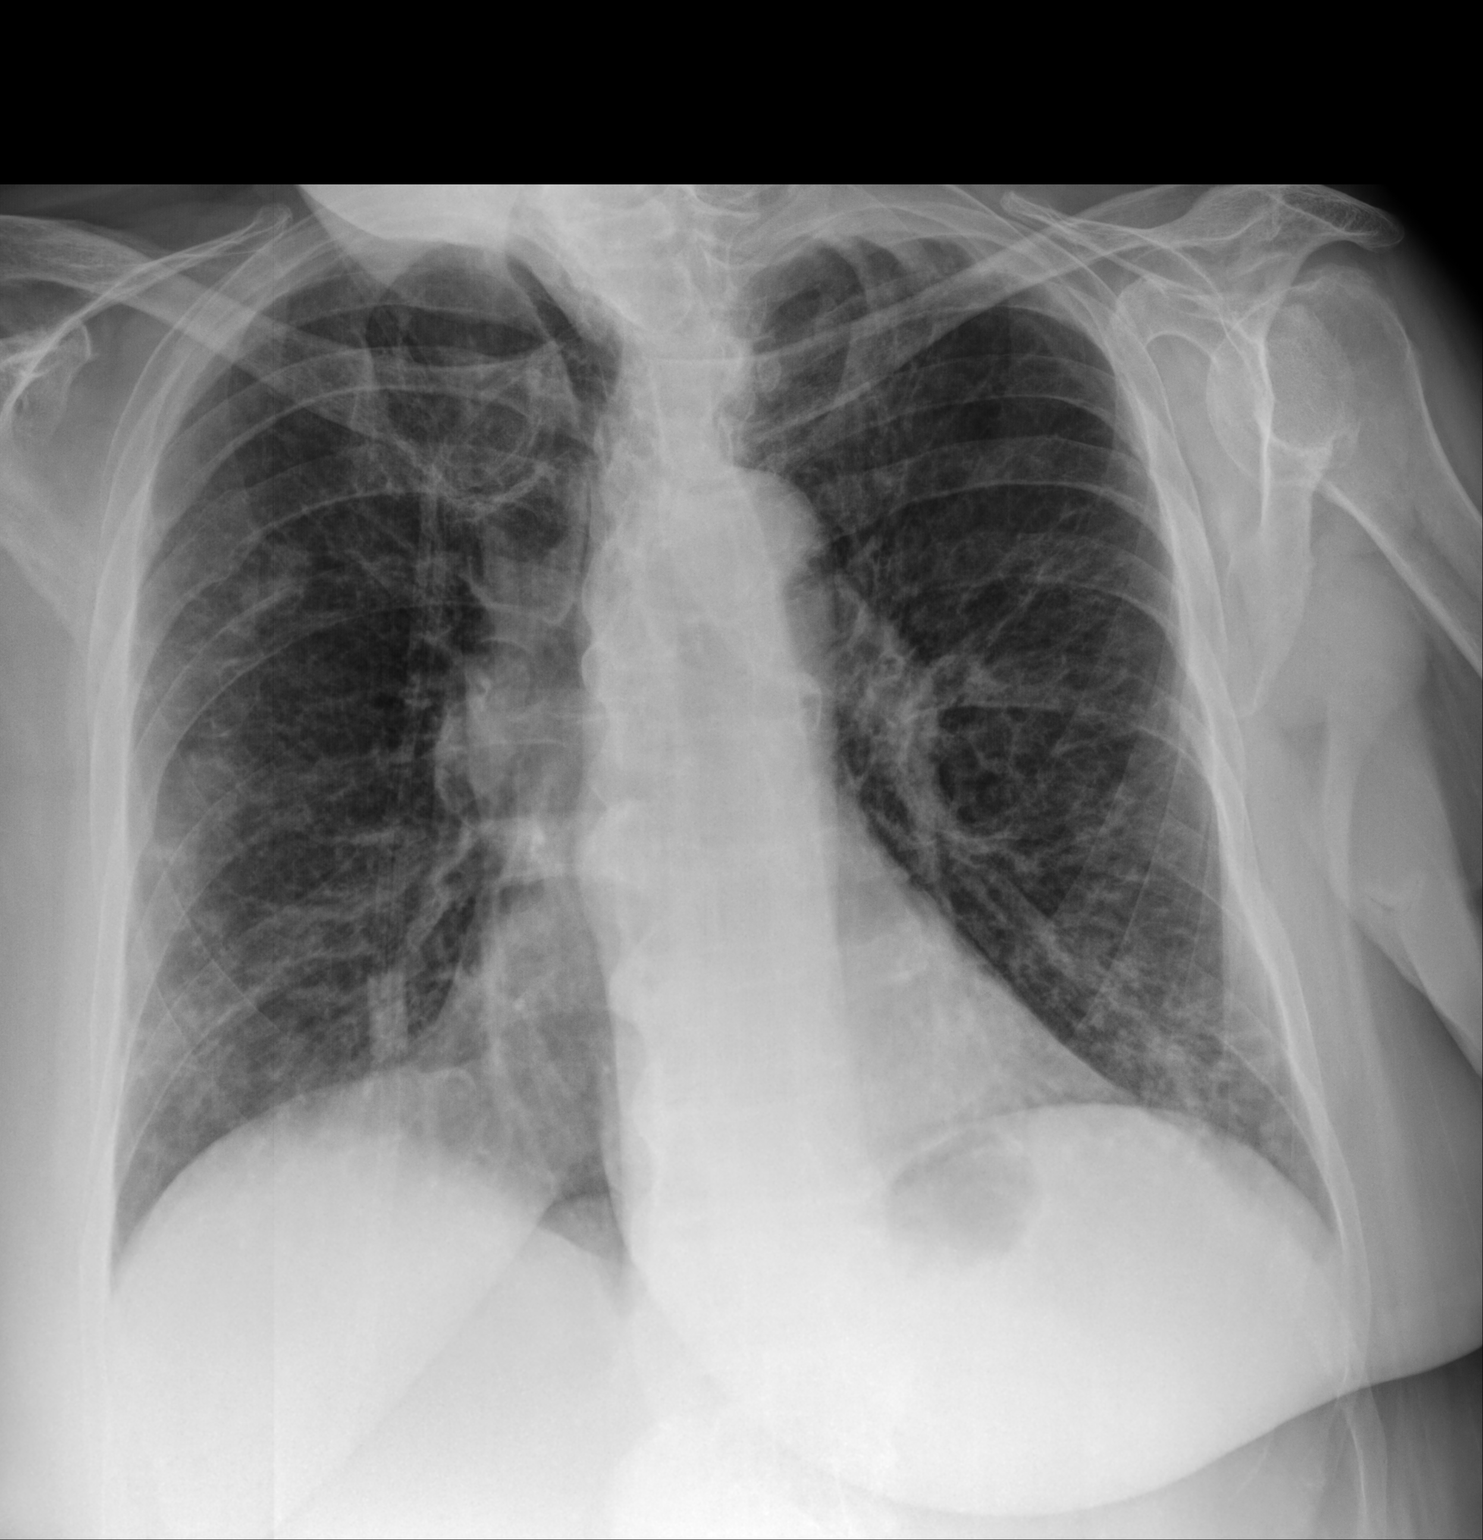

[chest lat]
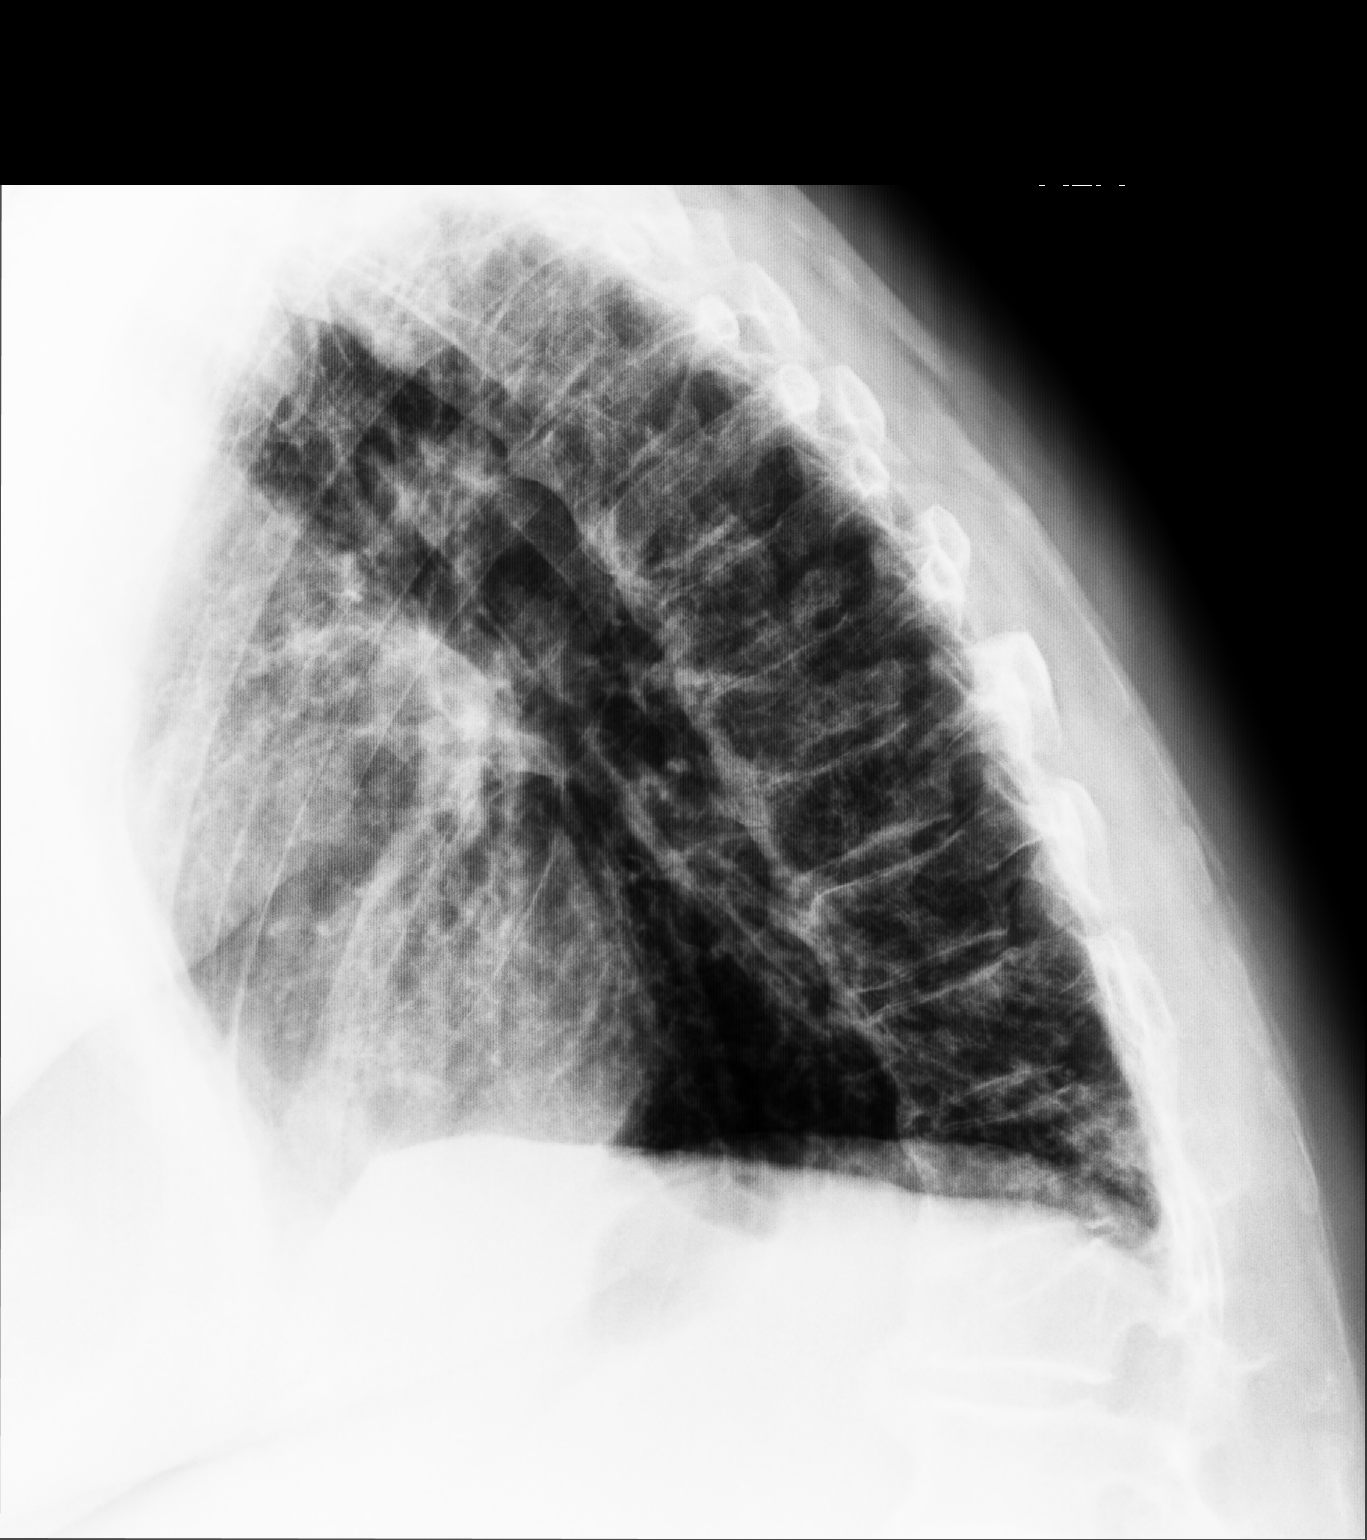

[2 of 2 positions shown; findings below may reference images not displayed]

FINDINGS: The heart size and mediastinal contours are within normal limits.
Diffuse, chronic appearing increased interstitial lung markings are
seen. There is no evidence of acute infiltrate, pleural effusion or
pneumothorax. Multilevel degenerative changes are seen throughout
the thoracic spine.
IMPRESSION: Chronic appearing increased interstitial lung markings without
evidence of acute or active cardiopulmonary disease.

## 2022-02-01 MED ORDER — LEVOFLOXACIN 750 MG PO TABS: 750.0000 mg | ORAL_TABLET | Freq: Every day | ORAL | 0 refills | Status: DC

## 2022-02-01 MED ORDER — IPRATROPIUM BROMIDE 0.02 % IN SOLN: 0.5000 mg | Freq: Four times a day (QID) | RESPIRATORY_TRACT | 12 refills | Status: DC

## 2022-02-01 MED ORDER — ALBUTEROL SULFATE (2.5 MG/3ML) 0.083% IN NEBU: 2.5000 mg | INHALATION_SOLUTION | Freq: Four times a day (QID) | RESPIRATORY_TRACT | 1 refills | Status: DC | PRN

## 2022-02-01 MED ORDER — PREDNISONE 20 MG PO TABS: 40.0000 mg | ORAL_TABLET | Freq: Every day | ORAL | 0 refills | Status: DC

## 2022-02-01 MED ORDER — IPRATROPIUM-ALBUTEROL 0.5-2.5 (3) MG/3ML IN SOLN
3.0000 mL | Freq: Once | RESPIRATORY_TRACT | Status: AC
  Administered 2022-02-01: 3 mL via RESPIRATORY_TRACT

## 2022-02-01 NOTE — Progress Notes (Signed)
?[name] ?DOB: 01/08/1951 ?Encounter date: 02/01/2022 ? ?This is a 71 y.o. female who presents with ?No chief complaint on file. ? ? ?History of present illness: ?Turned 87 in rehab center a week ago.  ? ?Did well after knee surgery, but then 4-5 days ago oxygen started to drop down and she was struggling. Oxygen dropping, hard to breathe. Sent home from [facility]. Has one oxygen tank which has run out. Has one tank at home to use. Nurse visit is set up for tomorrow with [facility]. She has been on 3L at home. Does have nebulizer at home, but doesn't use this. She did have a couple of treatments at the center, but not sure if she feels better. ? ?She has been using her trelegy.  ? ?No fevers, chills but has had sweats. Daughter states that she sounds terrible when she coughs but nothing coming up. COVID tested about 4 days ago.  ? ?States she is out of it. Daughter states that she didn't sound like herself last couple of days - dry mouth. Did sound better today from house. ? ?No chest pain, pressure, heart racing. Has not been eating. Has been drinking.  ? ? ?Allergies  ?Allergen Reactions  ? Celexa [Citalopram Hydrobromide]   ?  "goes weird"  ? Gabapentin   ?  "goes weird"  ?Other reaction(s): hall  ? ?Current Meds  ?Medication Sig  ? albuterol (PROVENTIL) (2.5 MG/3ML) 0.083% nebulizer solution Take 3 mLs (2.5 mg total) by nebulization every 6 (six) hours as needed for wheezing or shortness of breath.  ? aspirin 81 MG chewable tablet Chew 1 tablet (81 mg total) by mouth 2 (two) times daily.  ? Cholecalciferol (VITAMIN D3 PO) Take 1 tablet by mouth in the morning.  ? Cyanocobalamin (VITAMIN B-12 PO) Take 1 tablet by mouth in the morning.  ? Fluticasone-Umeclidin-Vilant (TRELEGY ELLIPTA) 200-62.5-25 MCG/ACT AEPB Inhale 1 puff into the lungs daily.  ? Fluticasone-Umeclidin-Vilant (TRELEGY ELLIPTA) 200-62.5-25 MCG/ACT AEPB Inhale 1 puff into the lungs daily.  ? ipratropium (ATROVENT) 0.02 % nebulizer  solution Take 2.5 mLs (0.5 mg total) by nebulization 4 (four) times daily.  ? levofloxacin (LEVAQUIN) 750 MG tablet Take 1 tablet (750 mg total) by mouth daily.  ? methocarbamol (ROBAXIN) 500 MG tablet Take 1 tablet (500 mg total) by mouth every 6 (six) hours as needed for muscle spasms.  ? predniSONE (DELTASONE) 20 MG tablet Take 2 tablets (40 mg total) by mouth daily with breakfast.  ? QUEtiapine (SEROQUEL) 400 MG tablet Take 400 mg by mouth 2 (two) times daily.  ? [DISCONTINUED] oxyCODONE (OXY IR/ROXICODONE) 5 MG immediate release tablet Take 1-2 tablets (5-10 mg total) by mouth every 4 (four) hours as needed for moderate pain (pain score 4-6).  ? ?Current Facility-Administered Medications for the 02/01/22 encounter (Office Visit) with Wynn Banker, MD  ?Medication  ? ipratropium-albuterol (DUONEB) 0.5-2.5 (3) MG/3ML nebulizer solution 3 mL  ? ? ?Review of Systems  ?Constitutional:  Positive for activity change (hasn't been able to be active in last few days), chills and fatigue. Negative for fever.  ?HENT:  Negative for congestion.   ?Respiratory:  Positive for cough, shortness of breath and wheezing. Negative for chest tightness.   ?Cardiovascular:  Negative for chest pain, palpitations and leg swelling.  ?Neurological:  Positive for weakness.  ?Psychiatric/Behavioral:  The patient is nervous/anxious.   ? ?Objective: ? ?BP 102/60 (BP Location: Left Arm, Patient Position: Sitting, Cuff Size: Large)   Pulse 81  Temp 98.2 ?F (36.8 ?C) (Oral)   Ht 5\' 10"  (1.778 m)   SpO2 95% Comment: after providing O2 here in office--jaf  BMI 32.90 kg/m?      ? ?BP Readings from Last 3 Encounters:  ?02/01/22 102/60  ?12/31/21 107/69  ?12/26/21 122/70  ? ?Wt Readings from Last 3 Encounters:  ?12/28/21 229 lb 4.5 oz (104 kg)  ?12/26/21 230 lb (104.3 kg)  ?12/19/21 227 lb 1.2 oz (103 kg)  ? ? ?Physical Exam ?Constitutional:   ?   Appearance: She is well-developed. She is ill-appearing. She is not toxic-appearing.  ?HENT:   ?   Right Ear: Tympanic membrane, ear canal and external ear normal.  ?   Left Ear: Tympanic membrane, ear canal and external ear normal.  ?   Nose: Mucosal edema present.  ?   Right Sinus: No maxillary sinus tenderness or frontal sinus tenderness.  ?   Left Sinus: No maxillary sinus tenderness or frontal sinus tenderness.  ?   Mouth/Throat:  ?   Pharynx: Uvula midline.  ?Cardiovascular:  ?   Rate and Rhythm: Normal rate and regular rhythm.  ?   Heart sounds: Normal heart sounds.  ?Pulmonary:  ?   Effort: Pulmonary effort is normal. Tachypnea and prolonged expiration present.  ?   Breath sounds: Decreased breath sounds and wheezing (diffuse) present. No rhonchi.  ?   Comments: She does have improved air exchange after DuoNeb treatment, but still has some restriction and some wheezing. ? ?She always has pursed lip breathing, but respiratory rate is increased today.  She appears more short of breath than baseline. ?Neurological:  ?   Mental Status: She is alert.  ? ? ?Assessment/Plan ? ?1. Chronic obstructive pulmonary disease with acute exacerbation (HCC) ?We are going to treat her for COPD exacerbation with prednisone as well as antibiotic.  I have chosen Levaquin because I do worry with recent surgery and current condition that she could have a pneumonia.  We will check chest x-ray today as well.  She is currently on oxygen which is helping her to maintain her O2 saturation and keeping her feeling more comfortable with breathing.  We discussed that with any worsening of breathing she needs to go to the emergency room for evaluation.  I have sent in albuterol and Atrovent for her to use in combination in her home nebulizer machine in addition to her baseline Trelegy inhaler. ? ?2. Weakness ?- TSH; Future ?- Comprehensive metabolic panel; Future ?- CBC with Differential/Platelet; Future ?- CBC with Differential/Platelet ?- Comprehensive metabolic panel ?- TSH ? ?3. Acute cough ?- DG Chest 2 View; Future ?- Resp  Panel by RT-PCR (Flu A&B, Covid) Nasopharyngeal Swab ? ?4. Wheezing ?- ipratropium-albuterol (DUONEB) 0.5-2.5 (3) MG/3ML nebulizer solution 3 mL ? ?5. B12 deficiency ?We will recheck levels with future blood work ? ?6. Vitamin D deficiency ?Recheck with future blood work. ? ?7. Hypoxia ?At rest, patient drops O2 sat 88%.  She is up to 96% with 3 L and feels more comfortable with this.  Home oxygen order was written and company was contacted urgently today as patient only has 1 tank of oxygen left at home. ?- For home use only DME oxygen ? ?Return for pending lab or imaging results. ?45 minutes spent in chart review, time with patient, exam, charting, discussion of follow-up treatment plan, reevaluation after breathing treatment. ? ? ? ?12/21/21, MD ?

## 2022-02-01 NOTE — Patient Instructions (Signed)
*  start with antibiotic and prednisone. Take prednisone with food.  ?*maintain oxygen level around 93-97 range. If higher than this then cut back on liter of oxygen (from 3 down to 2) and keep monitoring.  ?*continue with trelegy ?*use the albuterol and atrovent in the nebulizer at least 3 times/day to help with breathing. You can back off of this if breathing is feeling better or after a few days when prednisone has kicked in. You can use albuterol alone in the nebulizer as well every 4 hours if needed for breathing.  ? ?If breathing worse over weekend, go to ER.  ?

## 2022-02-02 LAB — COMPREHENSIVE METABOLIC PANEL
AG Ratio: 0.9 (calc) — ABNORMAL LOW (ref 1.0–2.5)
ALT: 29 U/L (ref 6–29)
AST: 30 U/L (ref 10–35)
Albumin: 3.3 g/dL — ABNORMAL LOW (ref 3.6–5.1)
Alkaline phosphatase (APISO): 134 U/L (ref 37–153)
BUN/Creatinine Ratio: 33 (calc) — ABNORMAL HIGH (ref 6–22)
BUN: 35 mg/dL — ABNORMAL HIGH (ref 7–25)
CO2: 23 mmol/L (ref 20–32)
Calcium: 9.8 mg/dL (ref 8.6–10.4)
Chloride: 105 mmol/L (ref 98–110)
Creat: 1.06 mg/dL — ABNORMAL HIGH (ref 0.60–1.00)
Globulin: 3.8 g/dL (calc) — ABNORMAL HIGH (ref 1.9–3.7)
Glucose, Bld: 116 mg/dL — ABNORMAL HIGH (ref 65–99)
Potassium: 4.1 mmol/L (ref 3.5–5.3)
Sodium: 142 mmol/L (ref 135–146)
Total Bilirubin: 0.4 mg/dL (ref 0.2–1.2)
Total Protein: 7.1 g/dL (ref 6.1–8.1)

## 2022-02-02 LAB — CBC WITH DIFFERENTIAL/PLATELET
Absolute Monocytes: 770 cells/uL (ref 200–950)
Basophils Absolute: 52 cells/uL (ref 0–200)
Basophils Relative: 0.5 %
Eosinophils Absolute: 42 cells/uL (ref 15–500)
Eosinophils Relative: 0.4 %
HCT: 34.2 % — ABNORMAL LOW (ref 35.0–45.0)
Hemoglobin: 11 g/dL — ABNORMAL LOW (ref 11.7–15.5)
Lymphs Abs: 1976 cells/uL (ref 850–3900)
MCH: 27.6 pg (ref 27.0–33.0)
MCHC: 32.2 g/dL (ref 32.0–36.0)
MCV: 85.7 fL (ref 80.0–100.0)
MPV: 9.9 fL (ref 7.5–12.5)
Monocytes Relative: 7.4 %
Neutro Abs: 7561 cells/uL (ref 1500–7800)
Neutrophils Relative %: 72.7 %
Platelets: 431 10*3/uL — ABNORMAL HIGH (ref 140–400)
RBC: 3.99 10*6/uL (ref 3.80–5.10)
RDW: 13.6 % (ref 11.0–15.0)
Total Lymphocyte: 19 %
WBC: 10.4 10*3/uL (ref 3.8–10.8)

## 2022-02-02 LAB — TSH: TSH: 1.69 mIU/L (ref 0.40–4.50)

## 2022-02-04 ENCOUNTER — Other Ambulatory Visit: Payer: Self-pay | Admitting: *Deleted

## 2022-02-04 ENCOUNTER — Telehealth: Payer: Self-pay | Admitting: Family Medicine

## 2022-02-04 MED ORDER — IPRATROPIUM BROMIDE 0.02 % IN SOLN: 0.5000 mg | Freq: Four times a day (QID) | RESPIRATORY_TRACT | 12 refills | Status: DC

## 2022-02-04 MED ORDER — ALBUTEROL SULFATE (2.5 MG/3ML) 0.083% IN NEBU: 2.5000 mg | INHALATION_SOLUTION | Freq: Four times a day (QID) | RESPIRATORY_TRACT | 1 refills | Status: DC | PRN

## 2022-02-04 NOTE — Telephone Encounter (Signed)
Fax received from [facility] requesting the diagnosis code for the Rx for Albuterol and Ipratropium nebulizer solutions.  Rxs were re-sent via escribe with the diagnosis code J44.1 (per office notes). ?

## 2022-02-04 NOTE — Telephone Encounter (Signed)
Pam rn with [facility] is calling and needs VO for SN 1x2, 2x2, 1x1, 3 x 1 month, 2 prn visits ?

## 2022-02-04 NOTE — Telephone Encounter (Signed)
ok 

## 2022-02-04 NOTE — Telephone Encounter (Signed)
Spoke with Pam and informed her of the approval for orders as below. ?

## 2022-02-05 ENCOUNTER — Ambulatory Visit (INDEPENDENT_AMBULATORY_CARE_PROVIDER_SITE_OTHER): Payer: Medicare Other

## 2022-02-05 ENCOUNTER — Other Ambulatory Visit: Payer: Self-pay | Admitting: Family Medicine

## 2022-02-05 VITALS — Ht 70.0 in | Wt 233.0 lb

## 2022-02-05 DIAGNOSIS — Z0000 Encounter for general adult medical examination without abnormal findings: Secondary | ICD-10-CM | POA: Diagnosis not present

## 2022-02-05 NOTE — Progress Notes (Signed)
? ?Subjective:  ? Judy [name] is a 71 y.o. female who presents for Medicare Annual (Subsequent) preventive examination. ? ?Review of Systems    ?Virtual Visit via Telephone Note ? ?I connected with  [name] on 02/05/22 at  2:45 PM EDT by telephone and verified that I am speaking with the correct person using two identifiers. ? ?Location: ?Patient: Home ?Provider: Office ?Persons participating in the virtual visit: patient/Nurse Health Advisor ?  ?I discussed the limitations, risks, security and privacy concerns of performing an evaluation and management service by telephone and the availability of in person appointments. The patient expressed understanding and agreed to proceed. ? ?Interactive audio and video telecommunications were attempted between this nurse and patient, however failed, due to patient having technical difficulties OR patient did not have access to video capability.  We continued and completed visit with audio only. ? ?Some vital signs may be absent or patient reported.  ? ?[name], LPN  ?Cardiac Risk Factors include: advanced age (>47men, >66 women) ? ?   ?Objective:  ?  ?Today's Vitals  ? 02/05/22 1455  ?Weight: 233 lb (105.7 kg)  ?Height: 5\' 10"  (1.778 m)  ? ?Body mass index is 33.43 kg/m?. ? ? ?  02/05/2022  ?  3:05 PM 12/28/2021  ?  6:00 PM 12/28/2021  ? 11:55 AM 12/19/2021  ?  1:54 PM 07/11/2021  ?  1:20 PM 01/24/2021  ?  1:23 PM 08/07/2020  ?  1:23 PM  ?Advanced Directives  ?Does Patient Have a Medical Advance Directive? No No No No No No No  ?Would patient like information on creating a medical advance directive? No - Patient declined No - Patient declined No - Patient declined  No - Patient declined Yes (MAU/Ambulatory/Procedural Areas - Information given)   ? ? ?Current Medications (verified) ?Outpatient Encounter Medications as of 02/05/2022  ?Medication Sig  ? albuterol (PROVENTIL) (2.5 MG/3ML) 0.083% nebulizer solution Take 3 mLs (2.5 mg total) by nebulization  every 6 (six) hours as needed for wheezing or shortness of breath.  ? aspirin 81 MG chewable tablet Chew 1 tablet (81 mg total) by mouth 2 (two) times daily.  ? Cholecalciferol (VITAMIN D3 PO) Take 1 tablet by mouth in the morning.  ? Cyanocobalamin (VITAMIN B-12 PO) Take 1 tablet by mouth in the morning.  ? Fluticasone-Umeclidin-Vilant (TRELEGY ELLIPTA) 200-62.5-25 MCG/ACT AEPB Inhale 1 puff into the lungs daily.  ? Fluticasone-Umeclidin-Vilant (TRELEGY ELLIPTA) 200-62.5-25 MCG/ACT AEPB Inhale 1 puff into the lungs daily.  ? ipratropium (ATROVENT) 0.02 % nebulizer solution Take 2.5 mLs (0.5 mg total) by nebulization 4 (four) times daily.  ? levofloxacin (LEVAQUIN) 750 MG tablet Take 1 tablet (750 mg total) by mouth daily.  ? methocarbamol (ROBAXIN) 500 MG tablet Take 1 tablet (500 mg total) by mouth every 6 (six) hours as needed for muscle spasms.  ? predniSONE (DELTASONE) 20 MG tablet Take 2 tablets (40 mg total) by mouth daily with breakfast.  ? QUEtiapine (SEROQUEL) 400 MG tablet Take 400 mg by mouth 2 (two) times daily.  ? ?No facility-administered encounter medications on file as of 02/05/2022.  ? ? ?Allergies (verified) ?Celexa [citalopram hydrobromide] and Gabapentin  ? ?History: ?Past Medical History:  ?Diagnosis Date  ? Anxiety   ? Arthritis   ? Avascular necrosis (HCC)   ? Left hip  ? 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 Bipolar 1 disorder (  HCC)   ? COPD (chronic obstructive pulmonary disease) (HCC)   ? Depression   ? Dyspnea   ? Pneumonia   ? PTSD (post-traumatic stress disorder)   ? ?Past Surgical History:  ?Procedure Laterality Date  ? bone spurs foot Right   ? CATARACT EXTRACTION Bilateral   ? COLONOSCOPY    ? HAND SURGERY Left   ? JOINT REPLACEMENT    ? TONSILLECTOMY    ? TONSILLECTOMY AND ADENOIDECTOMY    ? TOTAL HIP ARTHROPLASTY Left 03/19/2019  ? Procedure: LEFT TOTAL HIP ARTHROPLASTY ANTERIOR APPROACH;  Surgeon:  Kathryne HitchBlackman, Christopher Y, MD;  Location: [facility];  Service: Orthopedics;  Laterality: Left;  ? TOTAL HIP ARTHROPLASTY Right 06/04/2019  ? Procedure: RIGHT TOTAL HIP ARTHROPLASTY ANTERIOR APPROACH;  Surgeon: Kathryne HitchBlackman, Christopher Y, MD;  Location: [facility];  Service: Orthopedics;  Laterality: Right;  ? TOTAL KNEE ARTHROPLASTY Left 12/28/2021  ? Procedure: Left TOTAL KNEE ARTHROPLASTY;  Surgeon: Kathryne HitchBlackman, Christopher Y, MD;  Location: [facility];  Service: Orthopedics;  Laterality: Left;  ? ?Family History  ?Problem Relation Age of Onset  ? CAD Mother 4380  ? COPD Mother   ? Depression Mother   ? Heart attack Father   ? Colon polyps Sister   ? Heart attack Maternal Grandmother   ? Early death Maternal Grandfather   ? Hearing loss Paternal Grandfather   ? Bipolar disorder Son   ? Asthma Daughter   ? ?Social History  ? ?Socioeconomic History  ? Marital status: Divorced  ?  Spouse name: Not on file  ? Number of children: Not on file  ? Years of education: Not on file  ? Highest education level: Not on file  ?Occupational History  ? Occupation: retired  ?Tobacco Use  ? Smoking status: Former  ?  Packs/day: 1.25  ?  Years: 43.00  ?  Pack years: 53.75  ?  Types: Cigarettes  ?  Quit date: 05/19/2020  ?  Years since quitting: 1.7  ?  Passive exposure: Never  ? Smokeless tobacco: Never  ?Vaping Use  ? Vaping Use: Never used  ?Substance and Sexual Activity  ? Alcohol use: Yes  ?  Comment: occ  ? Drug use: No  ? Sexual activity: Not on file  ?Other Topics Concern  ? Not on file  ?Social History Narrative  ? Not on file  ? ?Social Determinants of Health  ? ?Financial Resource Strain: Low Risk   ? Difficulty of Paying Living Expenses: Not hard at all  ?Food Insecurity: No Food Insecurity  ? Worried About Programme researcher, broadcasting/film/videounning Out of Food in the Last Year: Never true  ? Ran Out of Food in the Last Year: Never true  ?Transportation Needs: No Transportation Needs  ? Lack of Transportation (Medical): No  ? Lack of Transportation (Non-Medical): No  ?Physical  Activity: Inactive  ? Days of Exercise per Week: 0 days  ? Minutes of Exercise per Session: 0 min  ?Stress: No Stress Concern Present  ? Feeling of Stress : Not at all  ?Social Connections: Moderately Integrated  ? Frequency of Communication with Friends and Family: More than three times a week  ? Frequency of Social Gatherings with Friends and Family: More than three times a week  ? Attends Religious Services: More than 4 times per year  ? Active Member of Clubs or Organizations: Yes  ? Attends BankerClub or Organization Meetings: More than 4 times per year  ? Marital Status: Divorced  ? ? ?Clinical Intake: ?How often  do you need to have someone help you when you read instructions, pamphlets, or other written materials from your doctor or pharmacy?: 1 - Never ? ?Diabetic?  No ? ?Activities of Daily Living ? ?  02/05/2022  ?  3:02 PM 12/28/2021  ?  6:00 PM  ?In your present state of health, do you have any difficulty performing the following activities:  ?Hearing? 0 0  ?Vision? 0 0  ?Difficulty concentrating or making decisions? 0 0  ?Walking or climbing stairs? 0 1  ?Dressing or bathing? 0 0  ?Doing errands, shopping? 0 0  ?Preparing Food and eating ? N   ?Using the Toilet? N   ?In the past six months, have you accidently leaked urine? N   ?Do you have problems with loss of bowel control? N   ?Managing your Medications? N   ?Managing your Finances? N   ?Housekeeping or managing your Housekeeping? N   ? ? ?Patient Care Team: ?Wynn Banker, MD as PCP - General (Family Medicine) ? ?Indicate any recent Medical Services you may have received from other than [facility] providers in the past year (date may be approximate). ? ?   ?Assessment:  ? This is a routine wellness examination for Emlyn. ? ?Hearing/Vision screen ?Hearing Screening - Comments:: No hearing difficulty ?Vision Screening - Comments:: Wears reading glasses. Followed by Dr Dione Booze ? ?Dietary issues and exercise activities discussed: ?Exercise limited by: None  identified ? ? Goals Addressed   ? ?  ?  ?  ?  ?  ? This Visit's Progress  ?   Patient Stated (pt-stated)     ?   Get my strength back. ?  ? ?  ? ?Depression Screen ? ?  02/05/2022  ?  3:00 PM 02/01/2022  ?  3:23 PM

## 2022-02-05 NOTE — Patient Instructions (Addendum)
?[name] , ?Thank you for taking time to come for your Medicare Wellness Visit. I appreciate your ongoing commitment to your health goals. Please review the following plan we discussed and let me know if I can assist you in the future.  ? ?These are the goals we discussed: ? Goals   ? ?   Patient Stated (pt-stated)   ?   Get my strength back. ?  ? ?  ?  ?This is a list of the screening recommended for you and due dates:  ?Health Maintenance  ?Topic Date Due  ? COVID-19 Vaccine (4 - Booster for Moderna series) 02/21/2022*  ? Zoster (Shingles) Vaccine (1 of 2) 05/08/2022*  ? Pneumonia Vaccine (3 - PPSV23 if available, else PCV20) 02/06/2023*  ? Flu Shot  05/07/2022  ? Mammogram  08/21/2022  ? Tetanus Vaccine  10/07/2022  ? Colon Cancer Screening  10/07/2028  ? DEXA scan (bone density measurement)  Completed  ? Hepatitis C Screening: USPSTF Recommendation to screen - Ages 34-79 yo.  Completed  ? HPV Vaccine  Aged Out  ?*Topic was postponed. The date shown is not the original due date.  ? ?Advanced directives: No  ? ?Conditions/risks identified: None ? ?Next appointment: Follow up in one year for your annual wellness visit.  ? ? ?Preventive Care 9 Years and Older, Female ?Preventive care refers to lifestyle choices and visits with your health care provider that can promote health and wellness. ?What does preventive care include? ?A yearly physical exam. This is also called an annual well check. ?Dental exams once or twice a year. ?Routine eye exams. Ask your health care provider how often you should have your eyes checked. ?Personal lifestyle choices, including: ?Daily care of your teeth and gums. ?Regular physical activity. ?Eating a healthy diet. ?Avoiding tobacco and drug use. ?Limiting alcohol use. ?Practicing safe sex. ?Taking low doses of aspirin every day. ?Taking vitamin and mineral supplements as recommended by your health care provider. ?What happens during an annual well check? ?The services and  screenings done by your health care provider during your annual well check will depend on your age, overall health, lifestyle risk factors, and family history of disease. ?Counseling  ?Your health care provider may ask you questions about your: ?Alcohol use. ?Tobacco use. ?Drug use. ?Emotional well-being. ?Home and relationship well-being. ?Sexual activity. ?Eating habits. ?History of falls. ?Memory and ability to understand (cognition). ?Work and work Statistician. ?Screening  ?You may have the following tests or measurements: ?Height, weight, and BMI. ?Blood pressure. ?Lipid and cholesterol levels. These may be checked every 5 years, or more frequently if you are over 79 years old. ?Skin check. ?Lung cancer screening. You may have this screening every year starting at age 58 if you have a 30-pack-year history of smoking and currently smoke or have quit within the past 15 years. ?Fecal occult blood test (FOBT) of the stool. You may have this test every year starting at age 50. ?Flexible sigmoidoscopy or colonoscopy. You may have a sigmoidoscopy every 5 years or a colonoscopy every 10 years starting at age 40. ?Prostate cancer screening. Recommendations will vary depending on your family history and other risks. ?Hepatitis C blood test. ?Hepatitis B blood test. ?Sexually transmitted disease (STD) testing. ?Diabetes screening. This is done by checking your blood sugar (glucose) after you have not eaten for a while (fasting). You may have this done every 1-3 years. ?Abdominal aortic aneurysm (AAA) screening. You may need this if you are a current or  former smoker. ?Osteoporosis. You may be screened starting at age 52 if you are at high risk. ?Talk with your health care provider about your test results, treatment options, and if necessary, the need for more tests. ?Vaccines  ?Your health care provider may recommend certain vaccines, such as: ?Influenza vaccine. This is recommended every year. ?Tetanus, diphtheria, and  acellular pertussis (Tdap, Td) vaccine. You may need a Td booster every 10 years. ?Zoster vaccine. You may need this after age 14. ?Pneumococcal 13-valent conjugate (PCV13) vaccine. One dose is recommended after age 22. ?Pneumococcal polysaccharide (PPSV23) vaccine. One dose is recommended after age 70. ?Talk to your health care provider about which screenings and vaccines you need and how often you need them. ?This information is not intended to replace advice given to you by your health care provider. Make sure you discuss any questions you have with your health care provider. ?Document Released: 10/20/2015 Document Revised: 06/12/2016 Document Reviewed: 07/25/2015 ?Elsevier Interactive Patient Education ? 2017 [place]. ? ?Fall Prevention in the Home ?Falls can cause injuries. They can happen to people of all ages. There are many things you can do to make your home safe and to help prevent falls. ?What can I do on the outside of my home? ?Regularly fix the edges of walkways and driveways and fix any cracks. ?Remove anything that might make you trip as you walk through a door, such as a raised step or threshold. ?Trim any bushes or trees on the path to your home. ?Use bright outdoor lighting. ?Clear any walking paths of anything that might make someone trip, such as rocks or tools. ?Regularly check to see if handrails are loose or broken. Make sure that both sides of any steps have handrails. ?Any raised decks and porches should have guardrails on the edges. ?Have any leaves, snow, or ice cleared regularly. ?Use sand or salt on walking paths during winter. ?Clean up any spills in your garage right away. This includes oil or grease spills. ?What can I do in the bathroom? ?Use night lights. ?Install grab bars by the toilet and in the tub and shower. Do not use towel bars as grab bars. ?Use non-skid mats or decals in the tub or shower. ?If you need to sit down in the shower, use a plastic, non-slip stool. ?Keep  the floor dry. Clean up any water that spills on the floor as soon as it happens. ?Remove soap buildup in the tub or shower regularly. ?Attach bath mats securely with double-sided non-slip rug tape. ?Do not have throw rugs and other things on the floor that can make you trip. ?What can I do in the bedroom? ?Use night lights. ?Make sure that you have a light by your bed that is easy to reach. ?Do not use any sheets or blankets that are too big for your bed. They should not hang down onto the floor. ?Have a firm chair that has side arms. You can use this for support while you get dressed. ?Do not have throw rugs and other things on the floor that can make you trip. ?What can I do in the kitchen? ?Clean up any spills right away. ?Avoid walking on wet floors. ?Keep items that you use a lot in easy-to-reach places. ?If you need to reach something above you, use a strong step stool that has a grab bar. ?Keep electrical cords out of the way. ?Do not use floor polish or wax that makes floors slippery. If you must use wax, use  non-skid floor wax. ?Do not have throw rugs and other things on the floor that can make you trip. ?What can I do with my stairs? ?Do not leave any items on the stairs. ?Make sure that there are handrails on both sides of the stairs and use them. Fix handrails that are broken or loose. Make sure that handrails are as long as the stairways. ?Check any carpeting to make sure that it is firmly attached to the stairs. Fix any carpet that is loose or worn. ?Avoid having throw rugs at the top or bottom of the stairs. If you do have throw rugs, attach them to the floor with carpet tape. ?Make sure that you have a light switch at the top of the stairs and the bottom of the stairs. If you do not have them, ask someone to add them for you. ?What else can I do to help prevent falls? ?Wear shoes that: ?Do not have high heels. ?Have rubber bottoms. ?Are comfortable and fit you well. ?Are closed at the toe. Do not  wear sandals. ?If you use a stepladder: ?Make sure that it is fully opened. Do not climb a closed stepladder. ?Make sure that both sides of the stepladder are locked into place. ?Ask someone to hold it for you, if

## 2022-02-07 ENCOUNTER — Ambulatory Visit (INDEPENDENT_AMBULATORY_CARE_PROVIDER_SITE_OTHER): Payer: Medicare Other | Admitting: Orthopaedic Surgery

## 2022-02-07 ENCOUNTER — Other Ambulatory Visit: Payer: Self-pay | Admitting: Family Medicine

## 2022-02-07 ENCOUNTER — Encounter: Payer: Self-pay | Admitting: Orthopaedic Surgery

## 2022-02-07 ENCOUNTER — Telehealth: Payer: Self-pay | Admitting: Orthopaedic Surgery

## 2022-02-07 DIAGNOSIS — E538 Deficiency of other specified B group vitamins: Secondary | ICD-10-CM

## 2022-02-07 DIAGNOSIS — R739 Hyperglycemia, unspecified: Secondary | ICD-10-CM

## 2022-02-07 DIAGNOSIS — J441 Chronic obstructive pulmonary disease with (acute) exacerbation: Secondary | ICD-10-CM

## 2022-02-07 DIAGNOSIS — Z96652 Presence of left artificial knee joint: Secondary | ICD-10-CM

## 2022-02-07 MED ORDER — TIZANIDINE HCL 4 MG PO TABS: 4.0000 mg | ORAL_TABLET | Freq: Every day | ORAL | 0 refills | Status: DC

## 2022-02-07 NOTE — Progress Notes (Signed)
The patient is now 6 weeks status post a left total knee arthroplasty.  She is only really sore at night but her daytime she does not really have much pain she states. ? ?On exam her extension is almost full and her flexion is almost full.  She is really made excellent progress.  The knee feels ligamentously stable of the left knee.  There is swelling to be expected from the surgery itself but overall the knee is stable. ? ?I did let her know how pleased I am in terms of how she has been able to get her knee bending and moving.  She can continue on strengthening.  I will send in some Zanaflex to try at bedtime.  From my standpoint, I do not need to see her back for 6 months unless there are issues.  At that visit we will have a standing AP and lateral of her left operative knee. ?

## 2022-02-07 NOTE — Telephone Encounter (Signed)
Missed visit today --and tomorrow she is coming to PCP --and insurance  will not pay for both ? ? ?Wants Verbal Orders for  Eval for Medical Social Worker & [employer]  ? ? ?Please leave on VM ?

## 2022-02-07 NOTE — Telephone Encounter (Signed)
Verbal order given  

## 2022-02-08 ENCOUNTER — Ambulatory Visit (INDEPENDENT_AMBULATORY_CARE_PROVIDER_SITE_OTHER): Payer: Medicare Other | Admitting: Family Medicine

## 2022-02-08 ENCOUNTER — Encounter: Payer: Self-pay | Admitting: Family Medicine

## 2022-02-08 VITALS — BP 104/50 | HR 67 | Temp 97.4°F | Ht 70.0 in

## 2022-02-08 DIAGNOSIS — Z9981 Dependence on supplemental oxygen: Secondary | ICD-10-CM | POA: Diagnosis not present

## 2022-02-08 DIAGNOSIS — J441 Chronic obstructive pulmonary disease with (acute) exacerbation: Secondary | ICD-10-CM | POA: Diagnosis not present

## 2022-02-08 MED ORDER — BREZTRI AEROSPHERE 160-9-4.8 MCG/ACT IN AERO: 2.0000 | INHALATION_SPRAY | Freq: Two times a day (BID) | RESPIRATORY_TRACT | 0 refills | Status: DC

## 2022-02-08 MED ORDER — PREDNISONE 20 MG PO TABS: ORAL_TABLET | ORAL | 0 refills | Status: DC

## 2022-02-08 MED ORDER — SPACER/AERO-HOLDING CHAMBERS DEVI: 1.0000 | Freq: Every day | 0 refills | Status: DC | PRN

## 2022-02-08 NOTE — Patient Instructions (Signed)
*  find out what company home health services is through and whether you have social work and occupational therapy pending. If you need an order placed for these let me know.  ? ? ?

## 2022-02-08 NOTE — Progress Notes (Signed)
?Shayne Alken ?DOB: [birth year] ?Encounter date: 02/08/2022 ? ?This is a 71 y.o. female who presents with ?Chief Complaint  ?Patient presents with  ? Follow-up  ? ? ?History of present illness: ?Patient was seen by me on 11/03/2021.  At that time she was having acute shortness of breath.  We treated her for COPD exacerbation.  Chest x-ray was stable.  Levaquin was added to cover for bacterial infection, especially in light of her recent assisted living residence for rehab postsurgery as well as recent total knee. ? ?Breathing has been doing ok. She can get down to 92-93. She is on 2.5 liters now.  ? ?Social work hasn't come to her house yet. Occupational therapist hasn't come. Did have PT. Home nurse did visit and is supposed to come back next week.  ? ? ?Allergies  ?Allergen Reactions  ? Celexa [Citalopram Hydrobromide]   ?  "goes weird"  ? Gabapentin   ?  "goes weird"  ?Other reaction(s): hall  ? ?Current Meds  ?Medication Sig  ? QUEtiapine (SEROQUEL) 300 MG tablet Take 300 mg by mouth at bedtime.  ? ? ?Review of Systems  ?Constitutional:  Negative for chills, fatigue and fever.  ?Respiratory:  Positive for cough, shortness of breath and wheezing (she has been doing breathingtx (duoneb) q 4 hours, but not sure it helps much. overall feels better with breathing, however.). Negative for chest tightness.   ?Cardiovascular:  Negative for chest pain, palpitations and leg swelling.  ? ?Objective: ? ?BP (!) 104/50 (BP Location: Left Arm, Patient Position: Sitting, Cuff Size: Large)   Pulse 67   Temp (!) 97.4 ?F (36.3 ?C) (Oral)   Ht 5\' 10"  (1.778 m)   SpO2 97%   BMI 33.43 kg/m?      ? ?BP Readings from Last 3 Encounters:  ?02/08/22 (!) 104/50  ?02/01/22 102/60  ?12/31/21 107/69  ? ?Wt Readings from Last 3 Encounters:  ?02/05/22 233 lb (105.7 kg)  ?12/28/21 229 lb 4.5 oz (104 kg)  ?12/26/21 230 lb (104.3 kg)  ? ? ?Physical Exam ?Constitutional:   ?   General: She is not in acute distress. ?   Appearance: She is  well-developed.  ?Cardiovascular:  ?   Rate and Rhythm: Normal rate and regular rhythm.  ?   Heart sounds: Normal heart sounds. No murmur heard. ?  No friction rub.  ?Pulmonary:  ?   Effort: Pulmonary effort is normal. Tachypnea and prolonged expiration present. No respiratory distress.  ?   Breath sounds: Decreased breath sounds and wheezing (diffuse expiratory) present. No rales.  ?Musculoskeletal:  ?   Right lower leg: No edema.  ?   Left lower leg: No edema.  ?Neurological:  ?   Mental Status: She is alert and oriented to person, place, and time.  ?Psychiatric:     ?   Behavior: Behavior normal.  ? ? ?Assessment/Plan ? ?1. COPD exacerbation ([place]) ?She is doing much better than last week. O2 requirement has decreased(currently on 2.5L). encouraged her to continue to decrease as tolerated to maintain O2 sat around 92. Discussed that she may be able to stop use at rest before she can stop use with activity. She is about out of her trelegy and cannot afford this. I have given her 1 month of breztri samples and instructed on use of spacing chamber with this. I have put in referral for CCM with pharmacy to helpwith patient assistance. I sent in prednisone burst incase worsening of sx over the weekend  since she just completed prednisone yesterday and is still very wheezy on exam. She will call if any worsening of sx or if she feels that she is notreturning to baseline. ? ?2. Oxygen dependent ?See above. We will request portable oxygen for her.  ? ?Return if symptoms worsen or fail to improve. ? ? ?35 minutes spent in evaluation of patient, discussion of treatment at home and plans for weaning oxygen as tolerated, exam, charting. ? ? ? ?Micheline Rough, MD ?

## 2022-02-11 ENCOUNTER — Telehealth: Payer: Self-pay | Admitting: *Deleted

## 2022-02-11 NOTE — Telephone Encounter (Signed)
Community message sent to the DME team at [place] [facility]. ? ?

## 2022-02-11 NOTE — Telephone Encounter (Signed)
-----   Message from Wynn Banker, MD sent at 02/08/2022  3:55 PM EDT ----- ?Can we get patient eval for portable oxygen? She got home oxygen tank, but I don't remember company we went through and I don't know what she has to do to get portable at this point and what eval is required.  ?

## 2022-02-13 ENCOUNTER — Ambulatory Visit: Payer: Medicare Other | Admitting: Family Medicine

## 2022-02-15 ENCOUNTER — Telehealth: Payer: Self-pay | Admitting: Pharmacist

## 2022-02-15 NOTE — Progress Notes (Signed)
?  Care Management  ?Note ? ? ?02/15/2022 ?Name: Judy [name] MRN: [id number] DOB: 1950/10/09 ? ?Judy [name] is a 71 y.o. year old female who is a primary care patient of Koberlein, Steele Berg, MD. The care management team was consulted for assistance with chronic disease management and care coordination needs.  ? ?Ms. Verdi was given information about Care Management services today including:  ?CCM service includes personalized support from designated clinical staff supervised by the physician, including individualized plan of care and coordination with other care providers ?24/7 contact phone numbers for assistance for urgent and routine care needs. ?Service will only be billed when office clinical staff spend 20 minutes or more in a month to coordinate care. ?Only one practitioner may furnish and bill the service in a calendar month. ?The patient may stop CCM services at amy time (effective at the end of the month) by phone call to the office staff. ?The patient will be responsible for cost sharing (co-pay) or up to 20% of the service fee (after annual deductible is met) ? ?Patient agreed to services and verbal consent obtained. ? ?Follow up plan:   ?An initial telephone outreach has been scheduled for: 02/20/22 _0  ? ?Noelle Penner ?Upstream Scheduler   ?

## 2022-02-15 NOTE — Chronic Care Management (AMB) (Signed)
? ? ?Chronic Care Management ?Pharmacy Assistant  ? ?Name: Judy [name]  MRN: [id number] DOB: 09/30/1951 ? ?Judy [name] is an 71 y.o. year old female who presents for her initial CCM visit with the clinical pharmacist. ? ?Reason for Encounter: Chart prep for initial visit with Judy [name] Clinical Pharmacist on 02/20/2022 at 11:00 over the phone.  ?  ?Conditions to be addressed/monitored: ?COPD, Anxiety, Bipolar Disorder, and Osteoarthritis, Hyperglycemia, B12 deficiency and PTSD. ? ?Recent office visits:  ?02/08/2022 Judy Rough MD - Patient was seen for COPD exacerbation and an additional issue. Started Breztri 160-9-4.8 2 puffs twice daily. Changed Prednisone to 20 mg to a taper. Decreased Quetiapine to 300 mg daily at bedtime. Discontinued Aspirin, Levofloxacin and Tizanidine. Return if symptoms worsen or fail to improve. ? ?02/05/2022 Judy Arbour LPN - Medicare annual wellness exam ? ?02/01/2022 Judy Rough MD - Patient was seen for Chronic obstructive pulmonary disease with acute exacerbation and additional issues. Started Proventil 0.5 mg nebulize every 6 hours prn, Atrovent 0.5 mg nebulize 4 times daily, Levaquin 750 mg daily and Prednisone 40 mg daily with breakfast. If breathing worse over weekend, go to ER. ? ?Recent consult visits:  ?02/07/2022 Judy Rosenthal MD (ortopedic) - Patient was seen for Status post total left knee replacement. Started Tizanidine 4 mg at bedtime. Discontinue Robaxin. Follow up in 6 months.  ? ?01/10/2022 Judy Rosenthal MD (ortopedic) - Patient was seen for Status post total left knee replacement. No medication changes. Follow up in 4 weeks.  ? ?12/26/2021 Judy Garfinkel MD (pulmonary) - Patient was seen for Chronic obstructive pulmonary disease, unspecified COPD type and additional issues. Discontinued Breztri, Lorazepam and Tramadol. Follow up in 3 months.  ? ?12/17/2021 Judy Garfinkel MD (pulmonary) - Patient was seen for COPD with  acute exacerbation, former smoker. Started Trelegy 1 puff daily and Prednisone 10 mg taper. Follow up in 1 week.  ? ?12/11/2021 Judy Sams PA-C (rheumatology) - Patient was seen for 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 and additional issues. No medication changes. Follow up in 3 months.  ? ?10/25/2021 Judy Rosenthal MD (orthopedic) - Patient was seen for Primary osteoarthritis of left knee. No medication changes. Follow up in 2 weeks.  ? ?09/19/2021 Judy Emery PA-C (orthopedic) - Patient was seen for Primary osteoarthritis of left knee and an additional issue. No medication changes. Follow up in 6 weeks.  ? ?09/12/2021 Judy Merino MD (rheumatology) - Patient was seen for 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 and additional issues. No medication changes. Follow up in 3 months.  ? ?08/20/2022 Judy Rosenthal MD (orthopedic) - Patient was seen for Effusion, left knee and an additional issue. No medication changes. Follow up in 3 weeks.  ? ?Hospital visits:  ?Admitted to [facility] on 12/28/2021 due to primary osteoarthritis of the left knee. Discharge date was 12/31/2021.  ?New?Medications Started at [facility] Discharge:?? ?aspirin ?methocarbamol (ROBAXIN) ?oxyCODONE (Oxy IR/ROXICODONE) ?Medication Changes at Hospital Discharge: ?No medication changes ?Medications Discontinued at Hospital Discharge: ?No medications discontinued ?Medications that remain the same after Hospital Discharge:??  ?-All other medications will remain the same.   ? ?Medications: ?Outpatient Encounter Medications as of 02/15/2022  ?Medication Sig  ? albuterol (PROVENTIL) (2.5 MG/3ML) 0.083% nebulizer solution Take 3 mLs (2.5 mg total) by nebulization every 6 (six) hours as needed for wheezing or shortness of breath.  ? Budeson-Glycopyrrol-Formoterol (BREZTRI AEROSPHERE) 160-9-4.8 MCG/ACT AERO Inhale 2 puffs into the lungs in the morning and at bedtime. [id number]  ?  Cholecalciferol  (VITAMIN D3 PO) Take 1 tablet by mouth in the morning.  ? Cyanocobalamin (VITAMIN B-12 PO) Take 1 tablet by mouth in the morning.  ? Fluticasone-Umeclidin-Vilant (TRELEGY ELLIPTA) 200-62.5-25 MCG/ACT AEPB Inhale 1 puff into the lungs daily.  ? Fluticasone-Umeclidin-Vilant (TRELEGY ELLIPTA) 200-62.5-25 MCG/ACT AEPB Inhale 1 puff into the lungs daily.  ? ipratropium (ATROVENT) 0.02 % nebulizer solution Take 2.5 mLs (0.5 mg total) by nebulization 4 (four) times daily.  ? predniSONE (DELTASONE) 20 MG tablet Take 2 tablets daily x 3 days, then 1 tablet daily x 5 days, then half tablet daily x 2 days  ? QUEtiapine (SEROQUEL) 300 MG tablet Take 300 mg by mouth at bedtime.  ? Spacer/Aero-Holding Chambers DEVI 1 Device by Does not apply route daily as needed.  ? ?No facility-administered encounter medications on file as of 02/15/2022.  ?Fill History: ?ALBUTEROL 0.083% 3ML NEB 02/03/2022 30  ? ?TRELEGY ELLIPTA 200-62.5-25 01/30/2022 30  ? ?LORazepam 1MG        TAB 11/16/2021 90  ? ?QUETIAPINE FUMARATE 400 MG TAB 01/29/2022 30  ? ?ANORO ELLIPTA 62.5-25 MCG INH 10/23/2021 30  ? ?BUPROPION XL 150MG  TAB 11/14/2021 30  ? ?TiZANidine 4MG       TAB 02/07/2022 30  ? ?TRAMADOL 50MG  TAB 12/07/2021 5  ? ?Have you seen any other providers since your last visit? **Patient denies ? ?Any changes in your medications or health? Patient states Seroquel has been increased to 400 mg daily and O2 has been decreased to 1.5 liters.  ? ?Any side effects from any medications? Patient denies any known side effects.  ? ?Do you have an symptoms or problems not managed by your medications? Patient denies. ? ?Any concerns about your health right now? Patient denies any concerns at this time.  ? ?Has your provider asked that you check blood pressure, blood sugar, or follow special diet at home? Patient denies.  ? ?Do you get any type of exercise on a regular basis? Patient is doing her physical therapy exercises daily.  ? ?Can you think of a goal you would  like to reach for your health? Patient wold like to be able to get out of house and back to normal. ? ?Do you have any problems getting your medications? Patient denies any issues with her medications  ? ?Is there anything that you would like to discuss during the appointment? Patient denies any other issues at this time.  ? ?Please bring medications and supplements to appointment ? ?Care Gaps: ?AWV - completed 02/05/2022 ?Last BP - 104/50 on 02/08/2022 ? ?Star Rating Drugs: ?None ? ? ?Gennie Alma CMA  ?Clinical Pharmacist Assistant ?[phone] ? ?

## 2022-02-20 ENCOUNTER — Other Ambulatory Visit: Payer: Self-pay | Admitting: Family Medicine

## 2022-02-20 ENCOUNTER — Ambulatory Visit (INDEPENDENT_AMBULATORY_CARE_PROVIDER_SITE_OTHER): Payer: Medicare Other | Admitting: Pharmacist

## 2022-02-20 DIAGNOSIS — E2839 Other primary ovarian failure: Secondary | ICD-10-CM

## 2022-02-20 DIAGNOSIS — F419 Anxiety disorder, unspecified: Secondary | ICD-10-CM

## 2022-02-20 DIAGNOSIS — J441 Chronic obstructive pulmonary disease with (acute) exacerbation: Secondary | ICD-10-CM

## 2022-02-20 NOTE — Progress Notes (Signed)
? ?Chronic Care Management ?Pharmacy Note ? ?02/20/2022 ?Name:  Judy [name] MRN:  [id number] DOB:  1951-07-26 ? ?Summary: ?Pt is having difficulty affording inhalers ? ?Recommendations/Changes made from today's visit: ?-Recommend repeat vitamin D level and DEXA scan ?-Recommended starting calcium citrate 600 mg twice daily ?-Applied for LIS and will apply for PAP for maintenance inhaler ?-Reached out to pulmonology about staying on Breztri for ease of PAP ? ?Plan: ?COPD assessment in 1-2 months ?Follow up in 6 months ? ? ?Subjective: ?Judy [name] is an 71 y.o. year old female who is a primary patient of Koberlein, Steele Berg, MD.  The CCM team was consulted for assistance with disease management and care coordination needs.   ? ?Engaged with patient by telephone for initial visit in response to provider referral for pharmacy case management and/or care coordination services.  ? ?Consent to Services:  ?The patient was given information about Chronic Care Management services, agreed to services, and gave verbal consent prior to initiation of services.  Please see initial visit note for detailed documentation.  ? ?Patient Care Team: ?Caren Macadam, MD as PCP - General (Family Medicine) ?Viona Gilmore, [facility] as Pharmacist (Pharmacist) ? ?Recent office visits: ?02/08/2022 Micheline Rough MD - Patient was seen for COPD exacerbation and an additional issue. Started Breztri 160-9-4.8 2 puffs twice daily. Changed Prednisone to 20 mg to a taper. Decreased Quetiapine to 300 mg daily at bedtime. Discontinued Aspirin, Levofloxacin and Tizanidine. Return if symptoms worsen or fail to improve. ?  ?02/05/2022 Rolene Arbour LPN - Medicare annual wellness exam ?  ?02/01/2022 Micheline Rough MD - Patient was seen for Chronic obstructive pulmonary disease with acute exacerbation and additional issues. Started Proventil 0.5 mg nebulize every 6 hours prn, Atrovent 0.5 mg nebulize 4 times daily, Levaquin 750 mg  daily and Prednisone 40 mg daily with breakfast. If breathing worse over weekend, go to ER. ? ?Recent consult visits: ?02/07/2022 Jean Rosenthal MD (ortopedic) - Patient was seen for Status post total left knee replacement. Started Tizanidine 4 mg at bedtime. Discontinue Robaxin. Follow up in 6 months.  ?  ?01/10/2022 Jean Rosenthal MD (ortopedic) - Patient was seen for Status post total left knee replacement. No medication changes. Follow up in 4 weeks.  ?  ?12/26/2021 Marshell Garfinkel MD (pulmonary) - Patient was seen for Chronic obstructive pulmonary disease, unspecified COPD type and additional issues. Discontinued Breztri, Lorazepam and Tramadol. Follow up in 3 months.  ?  ?12/17/2021 Marshell Garfinkel MD (pulmonary) - Patient was seen for COPD with acute exacerbation, former smoker. Started Trelegy 1 puff daily and Prednisone 10 mg taper. Follow up in 1 week.  ?  ?12/11/2021 Hazel Sams PA-C (rheumatology) - Patient was seen for 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 and additional issues. No medication changes. Follow up in 3 months.  ?  ?10/25/2021 Jean Rosenthal MD (orthopedic) - Patient was seen for Primary osteoarthritis of left knee. No medication changes. Follow up in 2 weeks.  ?  ?09/19/2021 Erskine Emery PA-C (orthopedic) - Patient was seen for Primary osteoarthritis of left knee and an additional issue. No medication changes. Follow up in 6 weeks.  ?  ?09/12/2021 Bo Merino MD (rheumatology) - Patient was seen for 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 and additional issues. No medication changes. Follow up in 3 months.  ?  ?08/20/2022 Jean Rosenthal MD (orthopedic) - Patient was seen for Effusion, left knee and an additional issue. No medication changes. Follow up in 3  weeks.  ?  ? ?Hospital visits: ?Admitted to [facility] on 12/28/2021 due to primary osteoarthritis of the left knee. Discharge date was 12/31/2021.  ?New?Medications Started at  [facility] Discharge:?? ?aspirin ?methocarbamol (ROBAXIN) ?oxyCODONE (Oxy IR/ROXICODONE) ?Medication Changes at Hospital Discharge: ?No medication changes ?Medications Discontinued at Hospital Discharge: ?No medications discontinued ?Medications that remain the same after Hospital Discharge:??  ?-All other medications will remain the same.   ? ? ?Objective: ? ?Lab Results  ?Component Value Date  ? CREATININE 1.06 (H) 02/01/2022  ? BUN 35 (H) 02/01/2022  ? GFR 58.48 (L) 10/30/2020  ? EGFR 44 (L) 12/11/2021  ? GFRNONAA >60 12/29/2021  ? GFRAA 54 (L) 04/11/2021  ? NA 142 02/01/2022  ? K 4.1 02/01/2022  ? CALCIUM 9.8 02/01/2022  ? CO2 23 02/01/2022  ? GLUCOSE 116 (H) 02/01/2022  ? ? ?Lab Results  ?Component Value Date/Time  ? HGBA1C 5.8 10/30/2020 12:27 PM  ? HGBA1C 5.7 10/28/2018 11:57 AM  ? GFR 58.48 (L) 10/30/2020 12:27 PM  ? GFR 62.48 12/11/2018 10:23 AM  ?  ?Last diabetic Eye exam: No results found for: HMDIABEYEEXA  ?Last diabetic Foot exam: No results found for: HMDIABFOOTEX  ? ?Lab Results  ?Component Value Date  ? CHOL 192 10/30/2020  ? HDL 50.20 10/30/2020  ? LDLCALC 108 (H) 10/30/2020  ? TRIG 167.0 (H) 10/30/2020  ? CHOLHDL 4 10/30/2020  ? ? ? ?  Latest Ref Rng & Units 02/01/2022  ?  4:25 PM 12/11/2021  ?  2:21 PM 04/11/2021  ?  2:19 PM  ?Hepatic Function  ?Total Protein 6.1 - 8.1 g/dL 7.1   6.7   6.5    ?AST 10 - 35 U/L _0 ?ALT 6 - 29 U/L _1 ?Total Bilirubin 0.2 - 1.2 mg/dL 0.4   0.3   0.4    ? ? ?Lab Results  ?Component Value Date/Time  ? TSH 1.69 02/01/2022 04:25 PM  ? TSH 1.28 10/30/2020 12:27 PM  ? FREET4 0.76 04/21/2017 02:27 PM  ? ? ? ?  Latest Ref Rng & Units 02/01/2022  ?  4:25 PM 12/29/2021  ?  3:20 AM 12/11/2021  ?  2:21 PM  ?CBC  ?WBC 3.8 - 10.8 Thousand/uL 10.4   15.1   4.0    ?Hemoglobin 11.7 - 15.5 g/dL 11.0   11.5   13.0    ?Hematocrit 35.0 - 45.0 % 34.2   37.0   40.3    ?Platelets 140 - 400 Thousand/uL 431   244   209    ? ? ?Lab Results  ?Component Value Date/Time  ? VD25OH  23.25 (L) 01/15/2021 01:30 PM  ? VD25OH 12.39 (L) 10/30/2020 12:27 PM  ? ? ?Clinical ASCVD: No  ?The 10-year ASCVD risk score (Arnett DK, et al., 2019) is: 10.4% ?  Values used to calculate the score: ?    Age: 49 years ?    Sex: Female ?    Is Non-Hispanic African American: No ?    Diabetic: No ?    Tobacco smoker: Yes ?    Systolic Blood Pressure: 542 mmHg ?    Is BP treated: No ?    HDL Cholesterol: 50.2 mg/dL ?    Total Cholesterol: 192 mg/dL   ? ? ?  02/05/2022  ?  3:00 PM 02/01/2022  ?  3:23 PM 01/24/2021  ?  1:21 PM  ?  Depression screen PHQ 2/9  ?Decreased Interest 0 0 0  ?Down, Depressed, Hopeless 0 0 1  ?PHQ - 2 Score 0 0 1  ?Altered sleeping 0 0   ?Tired, decreased energy 0 0   ?Change in appetite 0 0   ?Feeling bad or failure about yourself  0 0   ?Trouble concentrating 0 0   ?Moving slowly or fidgety/restless 0 0   ?Suicidal thoughts 0 0   ?PHQ-9 Score 0 0   ?  ? ?   ? View : No data to display.  ?  ?  ?  ? ? ?   ? View : No data to display.  ?  ?  ?  ? ? ? ? ?Social History  ? ?Tobacco Use  ?Smoking Status Former  ? Packs/day: 1.25  ? Years: 43.00  ? Pack years: 53.75  ? Types: Cigarettes  ? Quit date: 05/19/2020  ? Years since quitting: 1.7  ? Passive exposure: Never  ?Smokeless Tobacco Never  ? ?BP Readings from Last 3 Encounters:  ?02/08/22 (!) 104/50  ?02/01/22 102/60  ?12/31/21 107/69  ? ?Pulse Readings from Last 3 Encounters:  ?02/08/22 67  ?02/01/22 81  ?12/31/21 74  ? ?Wt Readings from Last 3 Encounters:  ?02/05/22 233 lb (105.7 kg)  ?12/28/21 229 lb 4.5 oz (104 kg)  ?12/26/21 230 lb (104.3 kg)  ? ?BMI Readings from Last 3 Encounters:  ?02/08/22 33.43 kg/m?  ?02/05/22 33.43 kg/m?  ?02/01/22 32.90 kg/m?  ? ? ?Assessment/Interventions: Review of patient past medical history, allergies, medications, health status, including review of consultants reports, laboratory and other test data, was performed as part of comprehensive evaluation and provision of chronic care management services.  ? ?SDOH:  (Social  Determinants of Health) assessments and interventions performed: Yes ?SDOH Interventions   ? ?Flowsheet Row Most Recent Value  ?SDOH Interventions   ?Financial Strain Interventions Other (Comment)  [working

## 2022-02-20 NOTE — Patient Instructions (Addendum)
Hi Judy [name], ? ?It was great to get to meet you over the telephone! Below is a summary of some of the topics we discussed.  ? ?Don't forget to start taking calcium citrate 500-600 mg twice daily to strengthen your bones. ? ?Please reach out to me if you have any questions or need anything before our follow up! ? ?Best, ?Maddie ? ?Jeni Salles, PharmD, BCACP ?Clinical Pharmacist ?Therapist, music at [place] ?[phone] ? ? Visit Information ? ? Goals Addressed   ? ?  ?  ?  ?  ? This Visit's Progress  ?  Track and Manage My Symptoms-COPD     ?  Timeframe:  Long-Range Goal ?Priority:  Medium ?Start Date:                             ?Expected End Date:                      ? ?Follow Up Date 04/22/22  ?  ?- develop a rescue plan ?- follow rescue plan if symptoms flare-up ?- keep follow-up appointments  ?  ?Why is this important?   ?Tracking your symptoms and other information about your health helps your doctor plan your care.  ?Write down the symptoms, the time of day, what you were doing and what medicine you are taking.  ?You will soon learn how to manage your symptoms.   ?  ?Notes:  ?  ? ?  ? ?Patient Care Plan: [place]  ?  ? ?Problem Identified: Problem: COPD, Anxiety, Osteoarthritis, and PTSD, Bipolar 1 disorder   ?  ? ?Long-Range Goal: Patient-Specific Goal   ?Start Date: 02/20/2022  ?Expected End Date: 02/21/2023  ?This Visit's Progress: On track  ?Priority: High  ?Note:   ?Current Barriers:  ?Unable to independently afford treatment regimen ?Unable to achieve control of COPD  ? ?Pharmacist Clinical Goal(s):  ?Patient will verbalize ability to afford treatment regimen ?achieve adherence to monitoring guidelines and medication adherence to achieve therapeutic efficacy ?achieve control of COPD as evidenced by patient report  through collaboration with PharmD and provider.  ? ?Interventions: ?1:1 collaboration with Caren Macadam, MD regarding development and update of comprehensive plan of  care as evidenced by provider attestation and co-signature ?Inter-disciplinary care team collaboration (see longitudinal plan of care) ?Comprehensive medication review performed; medication list updated in electronic medical record ? ?COPD (Goal: control symptoms and prevent exacerbations) ?-Controlled ?-Current treatment  ?Breztri 160-9-4.8 mcg/act 2 puffs twice daily - Appropriate, Query effective, Safe, Query accessible ?Albuterol nebulizer 3 mLs as needed - Appropriate, Effective, Safe, Accessible ?Ipratropium nebulizer 0.02% 2.5 mLs every 4 hours as needed - Appropriate, Effective, Safe, Accessible ?Albuterol HFA as needed  - Appropriate, Effective, Safe, Accessible ?-Medications previously tried: Trelegy (getting samples)  ?-Gold Grade: Gold 1 (FEV1>80%) ?-Current COPD Classification:  C (low sx, >/=2 exacerbations/yr) ?-MMRC/CAT score: n/a ?-Pulmonary function testing: 2019 ?-Exacerbations requiring treatment in last 6 months: yes ?-Patient reports consistent use of maintenance inhaler ?-Frequency of rescue inhaler use: not using right now ?-Counseled on Benefits of consistent maintenance inhaler use ?When to use rescue inhaler ?-Recommended to continue current medication ?Recommended marking her calendar for 1 week out from starting a sample of Breztri as she was using beyond that and inhaler did not have doses left in it. ? ?Bipolar 1 disorder/anxiety/PTSD (Goal: minimize symptoms) ?-Controlled ?-Current treatment: ?Quetiapine 400 mg 1 tablet at bedtime - Appropriate,  Effective, Safe, Accessible ?-Medications previously tried/failed: several medications ?-PHQ9: 0 ?-GAD7: n/a ?-Educated on Benefits of medication for symptom control ?Benefits of cognitive-behavioral therapy with or without medication ?-Recommended to continue current medication ? ?Osteopenia (Goal prevent fractures) ?-Uncontrolled ?-Last DEXA Scan: 10/05/2018  ? T-Score femoral neck: -1.3 ? T-Score total hip: n/a ? T-Score lumbar spine:  1.3 ? T-Score forearm radius: n/a ? 10-year probability of major osteoporotic fracture: 8.6% ? 10-year probability of hip fracture: 0.9% ?-Patient is not a candidate for pharmacologic treatment ?-Current treatment  ?Vitamin D 2000 units 1 tablet daily - Appropriate, Effective, Safe, Accessible ?-Medications previously tried: none  ?-Recommend [phone] units of vitamin D daily. Recommend 1200 mg of calcium daily from dietary and supplemental sources. Recommend weight-bearing and muscle strengthening exercises for building and maintaining bone density. ?-Recommended repeat DEXA and vitamin D level. ?Recommended starting calcium citrate 600 mg twice daily. ? ?Vitamin B12 deficiency (Goal: 211-911) ?-Controlled ?-Current treatment  ?Vitamin B12 2500 mcg 1 tablet daily - Appropriate, Effective, Safe, Accessible ?-Medications previously tried: none  ?-Recommended to continue current medication ? ?Health Maintenance ?-Vaccine gaps: shingrix, Prevnar20 or Pneumovax ?-Current therapy:  ?No medications ?-Educated on Cost vs benefit of each product must be carefully weighed by individual consumer ?-Patient is satisfied with current therapy and denies issues ?-Recommended to continue as is. ? ?Patient Goals/Self-Care Activities ?Patient will:  ?- take medications as prescribed as evidenced by patient report and record review ?collaborate with provider on medication access solutions ? ?Follow Up Plan: The care management team will reach out to the patient again over the next 14 days.  ? ?  ? ? ?Judy [name] was given information about Chronic Care Management services today including:  ?CCM service includes personalized support from designated clinical staff supervised by her physician, including individualized plan of care and coordination with other care providers ?24/7 contact phone numbers for assistance for urgent and routine care needs. ?Standard insurance, coinsurance, copays and deductibles apply for chronic care  management only during months in which we provide at least 20 minutes of these services. Most insurances cover these services at 100%, however patients may be responsible for any copay, coinsurance and/or deductible if applicable. This service may help you avoid the need for more expensive face-to-face services. ?Only one practitioner may furnish and bill the service in a calendar month. ?The patient may stop CCM services at any time (effective at the end of the month) by phone call to the office staff. ? ?Patient agreed to services and verbal consent obtained.  ? ?The patient verbalized understanding of instructions, educational materials, and care plan provided today and agreed to receive a mailed copy of patient instructions, educational materials, and care plan.  ?The pharmacy team will reach out to the patient again over the next 14 days.  ? ?Viona Gilmore, RPH  ?

## 2022-02-25 ENCOUNTER — Telehealth: Payer: Self-pay | Admitting: Family Medicine

## 2022-02-25 DIAGNOSIS — J441 Chronic obstructive pulmonary disease with (acute) exacerbation: Secondary | ICD-10-CM

## 2022-02-25 NOTE — Telephone Encounter (Signed)
Per her home health provider, patient requesting an order to discontinue her oxygen so that they DME company can stop charging her.  [employer] [phone]

## 2022-02-27 NOTE — Telephone Encounter (Signed)
Spoke with the patient and informed her of the message below.  Patient stated she told Audree Bane, nurse that came to her home that she has not used oxygen for the past week as she did not feel she needed this and she suggested the patient contact our office to have this discontinued with [employer].  Patient stated her oxygen is 97% while sitting-93-97% after walking around her home.  Message sent to PCP.

## 2022-02-27 NOTE — Telephone Encounter (Signed)
Since oxygen can also be hard to get back if we stop this, can we get documentation from a home provider that she is saturating ok on room air at rest and with activity level? If patient has been without oxygen and can confirm this (I know she has pulse ox at home) as well, I would be ok with that. Please just make sure she has not used at all for last 2 weeks in addition to all sats with rest/activity being over 92%

## 2022-03-01 NOTE — Telephone Encounter (Signed)
Ok sounds good. Ok to give order to stop home oxygen. Glad she is feeling better.

## 2022-03-01 NOTE — Telephone Encounter (Signed)
Per Felecia at [place], the order to discontinue oxygen cannot be given verbally and a faxed order would be needed to be sent to [phone]. Message sent to PCP.

## 2022-03-06 DIAGNOSIS — J449 Chronic obstructive pulmonary disease, unspecified: Secondary | ICD-10-CM | POA: Diagnosis not present

## 2022-03-06 DIAGNOSIS — F319 Bipolar disorder, unspecified: Secondary | ICD-10-CM | POA: Diagnosis not present

## 2022-03-06 DIAGNOSIS — M8580 Other specified disorders of bone density and structure, unspecified site: Secondary | ICD-10-CM

## 2022-03-06 DIAGNOSIS — Z87891 Personal history of nicotine dependence: Secondary | ICD-10-CM

## 2022-03-07 NOTE — Progress Notes (Signed)
Office Visit Note  [name]             Date of Birth: 1951-04-14           MRN: [id number]             PCP: Caren Macadam, MD (Inactive) Referring: Caren Macadam, MD Visit Date: 03/21/2022 Occupation: @GUAROCC @  Subjective:  Discuss restarting cimzia  History of Present Illness: Judy [name] is a 71 y.o. female with history of ankylosing spondylitis and DDD. She presents today to discuss restarting on cimzia.  Patient received clearance by Dr. Ninfa Linden to resume cimzia status post left knee replacement.  She states that she has transition to outpatient physical therapy and has been making progress.  She continues to use a cane to assist with ambulation but states that her pain level has only been about a 2 out of 10 on a daily basis.  She has been experiencing some increased cramping in both hands but denies any joint swelling.  She denies any progression of pain or stiffness in her neck or lower back.  She is not having any SI joint discomfort at this time.  She denies any groin pain currently.  She states that she is ready to get back on Cimzia now that it has been approved by her insurance.   She states that this morning she did notice a rash under her breasts and armpits which has concerned her.      Activities of Daily Living:  Patient reports morning stiffness for 30 minutes.   Patient Reports nocturnal pain.  Difficulty dressing/grooming: Reports Difficulty climbing stairs: Denies Difficulty getting out of chair: Denies Difficulty using hands for taps, buttons, cutlery, and/or writing: Denies  Review of Systems  Constitutional:  Positive for fatigue.  HENT:  Negative for mouth dryness.   Eyes:  Negative for dryness.  Respiratory:  Positive for shortness of breath.   Cardiovascular:  Positive for swelling in legs/feet.  Gastrointestinal:  Positive for constipation.  Endocrine: Positive for cold intolerance.  Genitourinary:  Negative  for difficulty urinating.  Musculoskeletal:  Positive for joint pain, gait problem, joint pain, joint swelling, muscle weakness and morning stiffness.  Skin:  Positive for rash.  Allergic/Immunologic: Negative for susceptible to infections.  Neurological:  Positive for weakness.  Hematological:  Negative for bruising/bleeding tendency.  Psychiatric/Behavioral:  Positive for sleep disturbance.     PMFS History:  Patient Active Problem List   Diagnosis Date Noted   Status post total left knee replacement 12/28/2021   Primary osteoarthritis of left knee 10/25/2021   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 ([place]) 11/30/2020   Status post total replacement of right hip 06/04/2019   Status post total replacement of left hip 03/19/2019   PTSD (post-traumatic stress disorder)    COPD (chronic obstructive pulmonary disease) (HCC)    Bipolar 1 disorder (HCC)    Anxiety    B12 deficiency 12/09/2018   Hyperglycemia 04/14/2017    Past Medical History:  Diagnosis Date   Anxiety    Arthritis    Avascular necrosis (HCC)    Left hip   Avascular necrosis of bone of hip, left ([place]) 12/28/2018   Avascular necrosis of bone of right hip ([place]) 04/28/2019   Bipolar 1 disorder (HCC)    COPD (chronic obstructive pulmonary disease) (HCC)    Depression    Dyspnea    Pneumonia    PTSD (post-traumatic stress disorder)     Family History  Problem Relation Age of Onset   CAD Mother 105   COPD Mother    Depression Mother    Heart attack Father    Colon polyps Sister    Heart attack Maternal Grandmother    Early death Maternal Grandfather    Hearing loss Paternal Grandfather    Bipolar disorder Son    Asthma Daughter    Past Surgical History:  Procedure Laterality Date   bone spurs foot Right    CATARACT EXTRACTION Bilateral    COLONOSCOPY     HAND SURGERY Left    JOINT REPLACEMENT     TONSILLECTOMY     TONSILLECTOMY AND ADENOIDECTOMY     TOTAL HIP ARTHROPLASTY Left 03/19/2019    Procedure: LEFT TOTAL HIP ARTHROPLASTY ANTERIOR APPROACH;  Surgeon: Mcarthur Rossetti, MD;  Location: [facility];  Service: Orthopedics;  Laterality: Left;   TOTAL HIP ARTHROPLASTY Right 06/04/2019   Procedure: RIGHT TOTAL HIP ARTHROPLASTY ANTERIOR APPROACH;  Surgeon: Mcarthur Rossetti, MD;  Location: [facility];  Service: Orthopedics;  Laterality: Right;   TOTAL KNEE ARTHROPLASTY Left 12/28/2021   Procedure: Left TOTAL KNEE ARTHROPLASTY;  Surgeon: Mcarthur Rossetti, MD;  Location: [facility];  Service: Orthopedics;  Laterality: Left;   Social History   Social History Narrative   Not on file   Immunization History  Administered Date(s) Administered   Influenza, High Dose Seasonal PF 08/25/2018   Moderna Sars-Covid-2 Vaccination 11/08/2019, 12/05/2019, 10/09/2020   Pneumococcal Conjugate-13 09/23/2018   Pneumococcal Polysaccharide-23 08/27/2016   Tdap 10/07/2012   Yellow Fever 07/05/2008     Objective: Vital Signs: BP (!) 154/86 (BP Location: Left Arm, Patient Position: Sitting, Cuff Size: Normal)   Pulse (!) 114   Resp 20   Ht 5\' 10"  (1.778 m)   Wt 206 lb (93.4 kg)   BMI 29.56 kg/m    Physical Exam Vitals and nursing note reviewed.  Constitutional:      Appearance: She is well-developed.  HENT:     Head: Normocephalic and atraumatic.  Eyes:     Conjunctiva/sclera: Conjunctivae normal.  Cardiovascular:     Rate and Rhythm: Normal rate and regular rhythm.     Heart sounds: Normal heart sounds.  Pulmonary:     Effort: Pulmonary effort is normal.     Breath sounds: Normal breath sounds.  Abdominal:     General: Bowel sounds are normal.     Palpations: Abdomen is soft.  Musculoskeletal:     Cervical back: Normal range of motion.  Skin:    General: Skin is warm and dry.     Capillary Refill: Capillary refill takes less than 2 seconds.  Neurological:     Mental Status: She is alert and oriented to person, place, and time.  Psychiatric:        Behavior: Behavior  normal.      Musculoskeletal Exam: Patient remained seated during examination today.  C-spine has significantly limited range of motion especially with lateral rotation and extension.  Thoracic kyphosis was noted.  Some midline spinal tenderness in the lumbar region.  No SI joint tenderness.  Shoulder joints have painful range of motion with some stiffness bilaterally.  Elbow joints, wrist joints, MCPs, PIPs, DIPs have good range of motion with no synovitis.  PIP and DIP thickening consistent with osteoarthritis of both hands.  Hip joints difficult to assess range of motion while in seated position.  Right knee joint has good range of motion with no warmth or effusion.  Left knee replacement  has slightly limited extension and flexion with warmth but no effusion.  Ankle joints have good range of motion with no tenderness or joint swelling.  CDAI Exam: CDAI Score: -- Patient Global: --; Provider Global: -- Swollen: --; Tender: -- Joint Exam 03/21/2022   No joint exam has been documented for this visit   There is currently no information documented on the homunculus. Go to the Rheumatology activity and complete the homunculus joint exam.  Investigation: No additional findings.  Imaging: No results found.  Recent Labs: Lab Results  Component Value Date   WBC 10.4 02/01/2022   HGB 11.0 (L) 02/01/2022   PLT 431 (H) 02/01/2022   Judy 142 02/01/2022   K 4.1 02/01/2022   CL 105 02/01/2022   CO2 23 02/01/2022   GLUCOSE 116 (H) 02/01/2022   BUN 35 (H) 02/01/2022   CREATININE 1.06 (H) 02/01/2022   BILITOT 0.4 02/01/2022   ALKPHOS 144 (H) 10/30/2020   AST 30 02/01/2022   ALT 29 02/01/2022   PROT 7.1 02/01/2022   ALBUMIN 4.2 10/30/2020   CALCIUM 9.8 02/01/2022   GFRAA 54 (L) 04/11/2021   QFTBGOLDPLUS NEGATIVE 04/11/2021    Speciality Comments: Cimzia completed loading on June 22, 2020  Procedures:  No procedures performed Allergies: Celexa [citalopram hydrobromide] and  Gabapentin   Assessment / Plan:     Visit Diagnoses: 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 ([place]) -  C-spine, thoracic spine, and lumbar spine fused.  She continues to have chronic pain and stiffness in her neck, thoracic, lumbar spine.  She has some midline spinal tenderness in the lumbar region.  No SI joint tenderness upon palpation.  She has not had any nocturnal pain in her spine or SI joints recently.  She is not currently experiencing any groin pain.  She has no synovitis or dactylitis on examination today.  Patient was previously on monthly Cimzia injections administered in the office.  Her last Cimzia injection was administered in September 2022.  She has been off of Cimzia due to several surgeries including a left knee replacement on 12/28/2021 by Dr. Ninfa Linden.  She has been cleared by Dr. Ninfa Linden to resume Cimzia as prescribed. Patient presents today with cutaneous candidiasis in the axillary region and inframammary region.  A prescription for fluconazole will be sent to the pharmacy today.  She was advised to notify us if the rash persists or worsens.  We will hold off on restarting Cimzia until the fungal infection has completely cleared. She will notify us once the rash has cleared and she will return to restart on Cimzia.  She will continue to require updated lab work every 3 months to monitor for drug toxicity while on Cimzia.  High risk medication use - Currently holding-Cimzia 400 mg sq injections every 4 weeks-Previously administered in the office. Completed loading for cimzia on 06/22/20. She declined self-injections.  - Plan: COMPLETE METABOLIC PANEL WITH GFR, CBC with Differential/Platelet, QuantiFERON-TB Gold Plus  Clearance from Dr. Ninfa Linden to restart cimzia s/p left knee replacement was received.   Her skin fungal infection will need to have completely cleared prior to restarting Cimzia. CBC and CMP updated on 02/01/22. Her next lab work will be due in July and every 3  months to monitor for drug toxicity.  Standing orders for CBC and CMP remain in place.  TB gold negative on 04/11/21. Future order for TB gold placed today.  Discussed the importance of postponing cimzia injections if she develops signs or symptoms of an  infection and to resume once the infection has completely cleared.   Screening for tuberculosis -Future order for TB gold was placed today.  Plan: QuantiFERON-TB Gold Plus  DDD (degenerative disc disease), lumbar: She has chronic pain and stiffness in her lower back but overall her symptoms have been stable.  She has not been experiencing nocturnal pain in her lower back.  She has no symptoms of radiculopathy at this time.  She continues to use a cane to assist with ambulation.  Spinal stenosis of lumbosacral region  Chronic SI joint pain: She has no SI joint tenderness upon palpation.  She has not had any nocturnal pain in her SI joints.  Status post total replacement of both hips - Hx of avascular necrosis.  She is not currently experiencing any groin pain.  S/P total knee arthroplasty, left - Dr. Blackman-12/28/21-Doing well.  She is currently going to outpatient physical therapy.  She is using a cane to assist with ambulation.  Her mobility and pain level have improved since undergoing surgery.  She has been cleared by Dr. Ninfa Linden to resume Cimzia as prescribed.  Yeast infection of the skin: Axillary and inframammary regions.  Beefy erythematous patches noted with some scaling pustules in the inframammary region.  She will benefit from oral fluconazole to clear the infection.  A prescription for fluconazole 100 mg once daily for 7 days will be sent to the pharmacy.   LFTs WNL on 02/01/22.  GFR >60 on 12/29/21. No history of QT prolongation.   We will hold off on restarting Cimzia until the fungal infection has completely cleared.  She will notify us once the rash has resolved.  Other medical conditions are listed as follows:   Bipolar 1  disorder ([place])  PTSD (post-traumatic stress disorder)  Pulmonary nodules  History of COPD  B12 deficiency  History of anxiety  Former smoker  Orders: Orders Placed This Encounter  Procedures   COMPLETE METABOLIC PANEL WITH GFR   CBC with Differential/Platelet   QuantiFERON-TB Gold Plus   Meds ordered this encounter  Medications   fluconazole (DIFLUCAN) 100 MG tablet    Sig: Take 1 tablet (100 mg total) by mouth daily.    Dispense:  7 tablet    Refill:  0    Follow-Up Instructions: Return in about 3 months (around 06/21/2022) for Ankylosing Spondylitis, DDD.   Ofilia Neas, PA-C  Note - This record has been created using Dragon software.  Chart creation errors have been sought, but may not always  have been located. Such creation errors do not reflect on  the standard of medical care.

## 2022-03-08 ENCOUNTER — Telehealth: Payer: Self-pay | Admitting: Orthopaedic Surgery

## 2022-03-08 ENCOUNTER — Other Ambulatory Visit: Payer: Self-pay | Admitting: Orthopaedic Surgery

## 2022-03-08 ENCOUNTER — Telehealth: Payer: Self-pay | Admitting: Pharmacist

## 2022-03-08 MED ORDER — HYDROCODONE-ACETAMINOPHEN 7.5-325 MG PO TABS: 1.0000 | ORAL_TABLET | Freq: Three times a day (TID) | ORAL | 0 refills | Status: DC | PRN

## 2022-03-08 NOTE — Telephone Encounter (Signed)
Patient called. She says the pain medication is not working. Need something stronger. Also she would like outpatient physical therapy. Her call back number is [phone]

## 2022-03-08 NOTE — Telephone Encounter (Signed)
Patient called as she only has 1 weeks worth of Breztri left until she runs out of samples. Patient is to discuss long term plan with upcoming pulmonology visit on 6/26. Set aside samples to last until that visit and patient will call here to start the process for patient assistance for the inhaler that she is to continue with.

## 2022-03-08 NOTE — Telephone Encounter (Signed)
Pt is calling checking on status of discontinue oxygen please fax to [employer] [phone]

## 2022-03-08 NOTE — Telephone Encounter (Signed)
Please advise 

## 2022-03-11 NOTE — Telephone Encounter (Signed)
We can discontinue oxygen and I will sign the order. Thanks!

## 2022-03-11 NOTE — Telephone Encounter (Signed)
Order entered under "DME other" was printed and comments and were added to discontinue oxygen previously ordered for the patient.  Faxed to [employer] at the number below.

## 2022-03-12 NOTE — Telephone Encounter (Signed)
Pt states she spoke with [employer] this morning and they still aren't showing the discontinuation order for her oxygen. Pt requesting that we fax it again to [phone]

## 2022-03-12 NOTE — Telephone Encounter (Signed)
Community message sent to the DME team with [employer] pending review.

## 2022-03-15 ENCOUNTER — Other Ambulatory Visit: Payer: Self-pay

## 2022-03-15 ENCOUNTER — Telehealth: Payer: Self-pay | Admitting: Family Medicine

## 2022-03-15 ENCOUNTER — Telehealth: Payer: Self-pay | Admitting: Orthopaedic Surgery

## 2022-03-15 DIAGNOSIS — Z96652 Presence of left artificial knee joint: Secondary | ICD-10-CM

## 2022-03-15 NOTE — Telephone Encounter (Signed)
Patient called advised Dr. Corliss Skains need to know if she can start back treating her.  The number to contact patient is [phone]

## 2022-03-15 NOTE — Telephone Encounter (Signed)
Pt call and stated the paper work that was sent to the oxygen co they didn't receive it and want it sent again.

## 2022-03-15 NOTE — Telephone Encounter (Signed)
Please advise 

## 2022-03-15 NOTE — Telephone Encounter (Signed)
Pt was called and advised and stated understanding. Pt also asked if she can do OP PT. I put the order in for her to go. Pt aware our office will call her. I also informed her that I would forward this message to Dr. Mellody Dance office too so they are aware.

## 2022-03-18 ENCOUNTER — Ambulatory Visit (INDEPENDENT_AMBULATORY_CARE_PROVIDER_SITE_OTHER): Payer: Medicare Other | Admitting: Physical Therapy

## 2022-03-18 ENCOUNTER — Encounter: Payer: Self-pay | Admitting: Physical Therapy

## 2022-03-18 DIAGNOSIS — M25562 Pain in left knee: Secondary | ICD-10-CM

## 2022-03-18 DIAGNOSIS — R600 Localized edema: Secondary | ICD-10-CM

## 2022-03-18 DIAGNOSIS — M6281 Muscle weakness (generalized): Secondary | ICD-10-CM | POA: Diagnosis not present

## 2022-03-18 DIAGNOSIS — M25662 Stiffness of left knee, not elsewhere classified: Secondary | ICD-10-CM | POA: Diagnosis not present

## 2022-03-18 DIAGNOSIS — R262 Difficulty in walking, not elsewhere classified: Secondary | ICD-10-CM

## 2022-03-18 NOTE — Telephone Encounter (Signed)
She may resume Cimzia.

## 2022-03-18 NOTE — Telephone Encounter (Signed)
Noted patient's appointment on 03/21/2022.

## 2022-03-18 NOTE — Therapy (Signed)
OUTPATIENT PHYSICAL THERAPY LOWER EXTREMITY EVALUATION   Patient Name: Judy SnellCatherine A Doughtie MRN: [id number] DOB:[birth year], 71 y.o., female Today's Date: 03/18/2022   PT End of Session - 03/18/22 1355     Visit Number 1    Number of Visits 24    Date for PT Re-Evaluation 06/14/22    Authorization Type KX modifier after 15 visits    Progress Note Due on Visit 10    PT Start Time 1350    PT Stop Time 1430    PT Time Calculation (min) 40 min    Activity Tolerance Patient tolerated treatment well    Behavior During Therapy WFL for tasks assessed/performed             Past Medical History:  Diagnosis Date   Anxiety    Arthritis    Avascular necrosis (HCC)    Left hip   Avascular necrosis of bone of hip, 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   Bipolar 1 disorder (HCC)    COPD (chronic obstructive pulmonary disease) (HCC)    Depression    Dyspnea    Pneumonia    PTSD (post-traumatic stress disorder)    Past Surgical History:  Procedure Laterality Date   bone spurs foot Right    CATARACT EXTRACTION Bilateral    COLONOSCOPY     HAND SURGERY Left    JOINT REPLACEMENT     TONSILLECTOMY     TONSILLECTOMY AND ADENOIDECTOMY     TOTAL HIP ARTHROPLASTY Left 03/19/2019   Procedure: LEFT TOTAL HIP ARTHROPLASTY ANTERIOR APPROACH;  Surgeon: Kathryne HitchBlackman, Christopher Y, MD;  Location: [facility];  Service: Orthopedics;  Laterality: Left;   TOTAL HIP ARTHROPLASTY Right 06/04/2019   Procedure: RIGHT TOTAL HIP ARTHROPLASTY ANTERIOR APPROACH;  Surgeon: Kathryne HitchBlackman, Christopher Y, MD;  Location: [facility];  Service: Orthopedics;  Laterality: Right;   TOTAL KNEE ARTHROPLASTY Left 12/28/2021   Procedure: Left TOTAL KNEE ARTHROPLASTY;  Surgeon: Kathryne HitchBlackman, Christopher Y, MD;  Location: [facility];  Service: Orthopedics;  Laterality: Left;   Patient Active Problem List   Diagnosis Date Noted   Status post total left knee replacement 12/28/2021   Primary  osteoarthritis of left knee 10/25/2021   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) 11/30/2020   Status post total replacement of right hip 06/04/2019   Status post total replacement of left hip 03/19/2019   PTSD (post-traumatic stress disorder)    COPD (chronic obstructive pulmonary disease) (HCC)    Bipolar 1 disorder (HCC)    Anxiety    B12 deficiency 12/09/2018   Hyperglycemia 04/14/2017    PCP: Wynn BankerKoberlein, Junell C, MD  REFERRING PROVIDER: Kathryne HitchBlackman, Christopher Y, MD  REFERRING DIAG: [phone] (ICD-10-CM) - Status post total left knee replacement  THERAPY DIAG:  Acute pain of left knee  Stiffness of left knee, not elsewhere classified  Muscle weakness (generalized)  Difficulty in walking, not elsewhere classified  Localized edema  Rationale for Evaluation and Treatment Rehabilitation  ONSET DATE: TKA 12/28/2021  SUBJECTIVE:   SUBJECTIVE STATEMENT: Pt s/p Left TKA on 12/28/2021. Pt went to sub acute rehab for 5 weeks and then had HHPT for about 6 weeks.   PERTINENT HISTORY: COPD, PTSD,  Rt THA 8/ 2020. Left THA 03/2019  PAIN:  Are you having pain? Yes: NPRS scale: 1/10 Pain location: left knee medial knee joint Pain description: achy Aggravating factors: walking, standing, bending Relieving factors: pain meds, ice  PRECAUTIONS: None  WEIGHT BEARING RESTRICTIONS No  FALLS:  Has patient fallen in last 6 months? No  LIVING ENVIRONMENT: Lives with: lives with their family Lives in: House/apartment Stairs: Yes: External: 4 steps; bilateral but cannot reach both Has following equipment at home: Single point cane  OCCUPATION: retired  PLOF: Independent  PATIENT GOALS Walk better and without pain, pt is going to [place] in 8 weeks and needs to be able to amb up and down 1 flight of stairs > 15 steps with rail.    OBJECTIVE:    PATIENT SURVEYS:  03/18/2022: FOTO 38% (predicted 53%)  COGNITION:  03/18/2022: Overall cognitive status: Within  functional limits for tasks assessed     SENSATION: 03/19/2031: WFL  EDEMA:  03/18/2022: Circumferential: Rt knee: 38 centimeters                          Left knee 40 centimeters  MUSCLE LENGTH: 03/18/2022:  Hamstrings: Right 82 deg; Left 75 deg   POSTURE: 03/18/2022: rounded shoulders and forward head  PALPATION: 03/18/2022: Tender around incision site and medial joint line of left knee  LOWER EXTREMITY ROM:  Active ROM Right eval Left eval  Hip flexion    Hip extension    Hip abduction    Hip adduction    Hip internal rotation    Hip external rotation    Knee flexion 135 128  Knee extension -6 -10  Ankle dorsiflexion    Ankle plantarflexion     (Blank rows = not tested)  PROM:  PROM:  03/18/2022    Left   Knee flexion 134   Knee extension -6     LOWER EXTREMITY MMT:  MMT Right Eval sitting Left Eval sitting  Hip flexion 28.2 lbs 32.4 lbs  Hip extension    Hip abduction    Hip adduction    Hip internal rotation    Hip external rotation    Knee flexion 32.2 lbs 21.1 lbs  Knee extension 34.7 lbs 40.3 lbs  Ankle dorsiflexion    Ankle plantarflexion     (Blank rows = not tested)    FUNCTIONAL TESTS:  03/18/2022: 5 times sit to stand: 17 seconds UE support  GAIT: 03/18/2022:  Distance walked: 30 feet  Assistive device utilized: Single point cane Level of assistance: Modified independence Comments: amb with antalgic gait     TODAY'S TREATMENT: 6/12/203:  HEP instruction/performance c cues for techniques, handout provided.  Trial set performed of each for comprehension and symptom assessment.  See below for exercise list.   PATIENT EDUCATION:  Education details: PT POC, HEP Person educated: Patient Education method: Explanation, Demonstration, Tactile cues, Verbal cues, and Handouts Education comprehension: verbalized understanding and returned demonstration   HOME EXERCISE PROGRAM: Access Code: [email or web address] URL:  [email or web address] Date: 03/18/2022 Prepared by: Narda Amber  Exercises - Supine Bridge  - 2 x daily - 7 x weekly - 2 sets - 10 reps - 3-5 hold - Sit to Stand with Counter Support  - 2 x daily - 7 x weekly - 10 reps - Heel Raises with Counter Support  - 1 x daily - 7 x weekly - 2 sets - 10 reps - Seated Long Arc Quad  - 2 x daily - 7 x weekly - 2 sets - 10 reps - 3-5 seconds hold - Standing Hip Abduction with Counter Support  - 2 x daily - 7 x weekly - 2 sets - 10 reps - Standing Hip Extension with Counter Support  - 2  x daily - 7 x weekly - 2 sets - 10 reps - Seated Passive Knee Extension  - 1 x daily - 7 x weekly - 5-10 reps - 5 seconds hold  ASSESSMENT:  CLINICAL IMPRESSION: Patient is a 71 y.o. female who comes to clinic with complaints of Left knee pain s/p Left TKA on 12/28/2021 with mobility, strength and movement coordination deficits that impair their ability to perform usual daily and recreational functional activities without increase difficulty/symptoms. Pt with extended history of sub acute rehab and HHPT prior to her evaluation.  Patient to benefit from skilled PT services to address impairments and limitations to improve to previous level of function without restriction secondary to condition.    OBJECTIVE IMPAIRMENTS Abnormal gait, decreased balance, decreased mobility, difficulty walking, decreased ROM, decreased strength, increased edema, impaired flexibility, and pain.   ACTIVITY LIMITATIONS bending, sitting, standing, squatting, sleeping, and stairs  PARTICIPATION LIMITATIONS: cleaning, driving, and shopping  PERSONAL FACTORS COPD, bipolar disorder, PTSD,  Rt THA 8/ 2020. Left THA 03/2019 are also affecting patient's functional outcome.   REHAB POTENTIAL: Good  CLINICAL DECISION MAKING: Stable/uncomplicated  EVALUATION COMPLEXITY: Low   GOALS: Goals reviewed with patient? Yes  SHORT TERM GOALS: Target date: 04/15/2022  Patient will  demonstrate independent use of initial home exercise program to maintain progress from in clinic treatments. Goal status: New       2. Pt will be able to perform 5 time sit to stand in </= 15 seconds with no UE support:   A. Goal Status: New     Long term PT goals (target date: 06/14/2022  ) Patient will demonstrate/report pain at worst less than or equal to 2/10 to facilitate minimal limitation in daily activity secondary to pain symptoms. Goal status: New   Patient will demonstrate independent use of home exercise program to facilitate ability to maintain/progress functional gains from skilled physical therapy services. Goal status: New   Patient will demonstrate FOTO outcome > or = 53 % to indicate reduced disability due to condition. Goal status: New   Pt will be able to navigate 1 flight of stairs with step over step pattern with single hand rail.  Goal status: New       5.  Pt will be able to amb with no device > 500 feet on community surfaces including curb step with no device.   Goal status: New   PLAN: PT FREQUENCY:  2-3x/ week   PT DURATION: 12 weeks  PLANNED INTERVENTIONS: Therapeutic exercises, Therapeutic activity, Neuromuscular re-education, Balance training, Gait training, Patient/Family education, Joint mobilization, Stair training, Dry Needling, Electrical stimulation, Cryotherapy, Moist heat, Taping, Vasopneumatic device, and Manual therapy  PLAN FOR NEXT SESSION: Bike, LE strengthening, Step ups, Leg Press, Vasopneumatic    Sharmon Leyden, PT, MPT 03/18/2022, 2:41 PM

## 2022-03-19 ENCOUNTER — Telehealth: Payer: Self-pay | Admitting: Pulmonary Disease

## 2022-03-20 ENCOUNTER — Telehealth: Payer: Self-pay | Admitting: Pharmacist

## 2022-03-20 NOTE — Telephone Encounter (Signed)
Patient is restarting Cimzia in-office injections tomorrow, 03/21/22.  Restarted Cimzia verification of benefits case on [employer] care portal.  Case [id number]   Patient has Medicare + [employer] supplement  Judy [name], PharmD, MPH, BCPS, CPP Clinical Pharmacist (Rheumatology and Pulmonology)

## 2022-03-21 ENCOUNTER — Encounter: Payer: Self-pay | Admitting: Physician Assistant

## 2022-03-21 ENCOUNTER — Ambulatory Visit (INDEPENDENT_AMBULATORY_CARE_PROVIDER_SITE_OTHER): Payer: Medicare Other | Admitting: Physician Assistant

## 2022-03-21 VITALS — BP 154/86 | HR 114 | Resp 20 | Ht 70.0 in | Wt 206.0 lb

## 2022-03-21 DIAGNOSIS — M4807 Spinal stenosis, lumbosacral region: Secondary | ICD-10-CM

## 2022-03-21 DIAGNOSIS — Z96652 Presence of left artificial knee joint: Secondary | ICD-10-CM

## 2022-03-21 DIAGNOSIS — M533 Sacrococcygeal disorders, not elsewhere classified: Secondary | ICD-10-CM

## 2022-03-21 DIAGNOSIS — Z111 Encounter for screening for respiratory tuberculosis: Secondary | ICD-10-CM | POA: Diagnosis not present

## 2022-03-21 DIAGNOSIS — Z87891 Personal history of nicotine dependence: Secondary | ICD-10-CM

## 2022-03-21 DIAGNOSIS — F319 Bipolar disorder, unspecified: Secondary | ICD-10-CM

## 2022-03-21 DIAGNOSIS — R918 Other nonspecific abnormal finding of lung field: Secondary | ICD-10-CM

## 2022-03-21 DIAGNOSIS — Z79899 Other long term (current) drug therapy: Secondary | ICD-10-CM | POA: Diagnosis not present

## 2022-03-21 DIAGNOSIS — M5136 Other intervertebral disc degeneration, lumbar region: Secondary | ICD-10-CM | POA: Diagnosis not present

## 2022-03-21 DIAGNOSIS — G8929 Other chronic pain: Secondary | ICD-10-CM

## 2022-03-21 DIAGNOSIS — M45 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: Secondary | ICD-10-CM

## 2022-03-21 DIAGNOSIS — E538 Deficiency of other specified B group vitamins: Secondary | ICD-10-CM

## 2022-03-21 DIAGNOSIS — Z96643 Presence of artificial hip joint, bilateral: Secondary | ICD-10-CM

## 2022-03-21 DIAGNOSIS — Z8709 Personal history of other diseases of the respiratory system: Secondary | ICD-10-CM

## 2022-03-21 DIAGNOSIS — B372 Candidiasis of skin and nail: Secondary | ICD-10-CM

## 2022-03-21 DIAGNOSIS — F4310 Post-traumatic stress disorder, unspecified: Secondary | ICD-10-CM

## 2022-03-21 DIAGNOSIS — Z8659 Personal history of other mental and behavioral disorders: Secondary | ICD-10-CM

## 2022-03-21 MED ORDER — FLUCONAZOLE 100 MG PO TABS
100.0000 mg | ORAL_TABLET | Freq: Every day | ORAL | 0 refills | Status: DC
Start: 2022-03-21 — End: 2022-03-21

## 2022-03-21 MED ORDER — FLUCONAZOLE 100 MG PO TABS: 100.0000 mg | ORAL_TABLET | Freq: Every day | ORAL | 0 refills | Status: DC

## 2022-03-21 NOTE — Telephone Encounter (Signed)
Per [employer] Verification of Benefits summary, CPT code [phone] is valid and billable with a 20% coinsurance for Cimzia. [facility] is in-network.  Patient must meet Part A/B deductible of $226. Patient has already met deductible. Plan covers 80% of cost of in-office injection.  The Plan G supplement through [place] is active. This will cover the 20% coinsurance that Medicare does not cover. The supplemental plan will not cover the Part B deductible.  Case # [id number]  Knox Saliva, PharmD, MPH, BCPS, CPP Clinical Pharmacist (Rheumatology and Pulmonology)

## 2022-03-21 NOTE — Patient Instructions (Signed)
Standing Labs ?We placed an order today for your standing lab work.  ? ?Please have your standing labs drawn in July and every 3 months  ? ?If possible, please have your labs drawn 2 weeks prior to your appointment so that the provider can discuss your results at your appointment. ? ?Please note that you may see your imaging and lab results in MyChart before we have reviewed them. ?We may be awaiting multiple results to interpret others before contacting you. ?Please allow our office up to 72 hours to thoroughly review all of the results before contacting the office for clarification of your results. ? ?We have open lab daily: ?Monday through Thursday from 1:30-4:30 PM and Friday from 1:30-4:00 PM ?at the office of Dr. Shaili Deveshwar, [facility].   ?Please be advised, all patients with office appointments requiring lab work will take precedent over walk-in lab work.  ?If possible, please come for your lab work on Monday and Friday afternoons, as you may experience shorter wait times. ?The office is located at [address], [address], [place],  [address] ?No appointment is necessary.   ?Labs are drawn by [facility]. Please bring your co-pay at the time of your lab draw.  You may receive a bill from [facility] for your lab work. ? ?Please note if you are on Hydroxychloroquine and and an order has been placed for a Hydroxychloroquine level, you will need to have it drawn 4 hours or more after your last dose. ? ?If you wish to have your labs drawn at another location, please call the office 24 hours in advance to send orders. ? ?If you have any questions regarding directions or hours of operation,  ?please call [phone].   ?As a reminder, please drink plenty of water prior to coming for your lab work. Thanks! ? ?

## 2022-03-21 NOTE — Addendum Note (Signed)
Addended by: Henriette Combs on: 03/21/2022 04:48 PM   Modules accepted: Orders

## 2022-03-25 ENCOUNTER — Other Ambulatory Visit (HOSPITAL_COMMUNITY): Payer: Self-pay

## 2022-03-25 ENCOUNTER — Telehealth: Payer: Self-pay

## 2022-03-25 NOTE — Telephone Encounter (Signed)
Patient Advocate Encounter   Received notification that prior authorization for Albuterol Sulfate HFA 108 (90 Base)MCG/ACT aerosol is required.   PA submitted on 03/25/2022 Key [id number]  Status is pending

## 2022-03-26 NOTE — Telephone Encounter (Signed)
Oxygen was discontinued.  

## 2022-03-26 NOTE — Telephone Encounter (Signed)
Oxygen was discontinued.

## 2022-03-27 ENCOUNTER — Encounter: Payer: Self-pay | Admitting: Physical Therapy

## 2022-03-27 ENCOUNTER — Ambulatory Visit (INDEPENDENT_AMBULATORY_CARE_PROVIDER_SITE_OTHER): Payer: Medicare Other | Admitting: Physical Therapy

## 2022-03-27 DIAGNOSIS — M25662 Stiffness of left knee, not elsewhere classified: Secondary | ICD-10-CM | POA: Diagnosis not present

## 2022-03-27 DIAGNOSIS — R262 Difficulty in walking, not elsewhere classified: Secondary | ICD-10-CM

## 2022-03-27 DIAGNOSIS — M25562 Pain in left knee: Secondary | ICD-10-CM

## 2022-03-27 DIAGNOSIS — R600 Localized edema: Secondary | ICD-10-CM

## 2022-03-27 DIAGNOSIS — M6281 Muscle weakness (generalized): Secondary | ICD-10-CM

## 2022-03-27 NOTE — Therapy (Signed)
OUTPATIENT PHYSICAL THERAPY TREATMENT NOTE   Patient Name: RYANN PAULI MRN: [id number] DOB:[birth year], 71 y.o., female Today's Date: 03/27/2022   END OF SESSION:   PT End of Session - 03/27/22 1259     Visit Number 2    Number of Visits 24    Date for PT Re-Evaluation 06/14/22    Authorization Type KX modifier after 15 visits    Progress Note Due on Visit 10    PT Start Time 1300    PT Stop Time 1340    PT Time Calculation (min) 40 min    Activity Tolerance Patient tolerated treatment well    Behavior During Therapy WFL for tasks assessed/performed             Past Medical History:  Diagnosis Date   Anxiety    Arthritis    Avascular necrosis (HCC)    Left hip   Avascular necrosis of bone of hip, 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   Bipolar 1 disorder (HCC)    COPD (chronic obstructive pulmonary disease) (HCC)    Depression    Dyspnea    Pneumonia    PTSD (post-traumatic stress disorder)    Past Surgical History:  Procedure Laterality Date   bone spurs foot Right    CATARACT EXTRACTION Bilateral    COLONOSCOPY     HAND SURGERY Left    JOINT REPLACEMENT     TONSILLECTOMY     TONSILLECTOMY AND ADENOIDECTOMY     TOTAL HIP ARTHROPLASTY Left 03/19/2019   Procedure: LEFT TOTAL HIP ARTHROPLASTY ANTERIOR APPROACH;  Surgeon: [name], MD;  Location: [facility];  Service: Orthopedics;  Laterality: Left;   TOTAL HIP ARTHROPLASTY Right 06/04/2019   Procedure: RIGHT TOTAL HIP ARTHROPLASTY ANTERIOR APPROACH;  Surgeon: [name], MD;  Location: [facility];  Service: Orthopedics;  Laterality: Right;   TOTAL KNEE ARTHROPLASTY Left 12/28/2021   Procedure: Left TOTAL KNEE ARTHROPLASTY;  Surgeon: [name], MD;  Location: [facility];  Service: Orthopedics;  Laterality: Left;   Patient Active Problem List   Diagnosis Date Noted   Status post total left knee replacement 12/28/2021   Primary  osteoarthritis of left knee 10/25/2021   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) 11/30/2020   Status post total replacement of right hip 06/04/2019   Status post total replacement of left hip 03/19/2019   PTSD (post-traumatic stress disorder)    COPD (chronic obstructive pulmonary disease) (HCC)    Bipolar 1 disorder (HCC)    Anxiety    B12 deficiency 12/09/2018   Hyperglycemia 04/14/2017     THERAPY DIAG:  Acute pain of left knee  Stiffness of left knee, not elsewhere classified  Muscle weakness (generalized)  Difficulty in walking, not elsewhere classified  Localized edema  PCP: Wynn Banker, MD   REFERRING PROVIDER: [name], MD   REFERRING DIAG: [phone] (ICD-10-CM) - Status post total left knee replacement   Rationale for Evaluation and Treatment Rehabilitation   ONSET DATE: TKA 12/28/2021   SUBJECTIVE:    SUBJECTIVE STATEMENT: Feels her knee is more painful over the past week, Rt knee is also uncomfortable.  She's trying to limit how much she takes pain medication    PERTINENT HISTORY: COPD, PTSD,  Rt THA 8/ 2020. Left THA 03/2019   PAIN:  Are you having pain? Yes: NPRS scale: 3-4/10 Pain location: left knee medial knee joint Pain description: achy Aggravating factors:  walking, standing, bending Relieving factors: pain meds, ice   PRECAUTIONS: None     OBJECTIVE:      PATIENT SURVEYS:  03/18/2022: FOTO 38% (predicted 53%)   EDEMA:  03/18/2022: Circumferential: Rt knee: 38 centimeters                                              Left knee 40 centimeters   MUSCLE LENGTH: 03/18/2022:  Hamstrings: Right 82 deg; Left 75 deg   PALPATION: 03/18/2022: Tender around incision site and medial joint line of left knee   LOWER EXTREMITY ROM:   Active ROM Right eval Left eval  Knee flexion 135 128  Knee extension -6 -10   (Blank rows = not tested)   PROM:  PROM:  03/18/2022      Left    Knee flexion 134    Knee  extension -6        LOWER EXTREMITY MMT:   MMT Right Eval sitting Left Eval sitting  Hip flexion 28.2 lbs 32.4 lbs  Knee flexion 32.2 lbs 21.1 lbs  Knee extension 34.7 lbs 40.3 lbs   (Blank rows = not tested)      FUNCTIONAL TESTS:  03/18/2022: 5 times sit to stand: 17 seconds UE support   GAIT: 03/18/2022:  Distance walked: 30 feet  Assistive device utilized: Single point cane Level of assistance: Modified independence Comments: amb with antalgic gait        TODAY'S TREATMENT: 03/27/22 Therex:      Aerobic: Recumbent bike L3 x 5 min     Machines: Knee extension 5# single limb 2x10 bil Knee flexion 10# single limb 2x10 bil     Sitting: Sit to/from stand x 10 reps on elevated mat table LAQ 5# 2x10 bil Hamstring curl L4 2x10 bil Hip adduction isometrics 10 x 5 sec Hip abduction L4 band x 10 reps       Standing Forward step ups onto 4" step x 10 reps bil and 1 UE support  6/12/203:  HEP instruction/performance c cues for techniques, handout provided.  Trial set performed of each for comprehension and symptom assessment.  See below for exercise list.     PATIENT EDUCATION:  Education details: PT POC, HEP Person educated: Patient Education method: Explanation, Demonstration, Tactile cues, Verbal cues, and Handouts Education comprehension: verbalized understanding and returned demonstration     HOME EXERCISE PROGRAM: Access Code: [email or web address] URL: [email or web address] Date: 03/18/2022 Prepared by: Narda Amber   Exercises - Supine Bridge  - 2 x daily - 7 x weekly - 2 sets - 10 reps - 3-5 hold - Sit to Stand with Counter Support  - 2 x daily - 7 x weekly - 10 reps - Heel Raises with Counter Support  - 1 x daily - 7 x weekly - 2 sets - 10 reps - Seated Long Arc Quad  - 2 x daily - 7 x weekly - 2 sets - 10 reps - 3-5 seconds hold - Standing Hip Abduction with Counter Support  - 2 x daily - 7 x weekly - 2 sets - 10 reps - Standing Hip Extension  with Counter Support  - 2 x daily - 7 x weekly - 2 sets - 10 reps - Seated Passive Knee Extension  - 1 x daily - 7 x weekly - 5-10 reps - 5 seconds hold   ASSESSMENT:  CLINICAL IMPRESSION: Pt tolerated session well today despite reports of elevated pain at beginning of session.  She's unable to lie supine due to COPD so deferred vaso today.  Will continue to benefit from PT to maximize function.    OBJECTIVE IMPAIRMENTS Abnormal gait, decreased balance, decreased mobility, difficulty walking, decreased ROM, decreased strength, increased edema, impaired flexibility, and pain.    ACTIVITY LIMITATIONS bending, sitting, standing, squatting, sleeping, and stairs   PARTICIPATION LIMITATIONS: cleaning, driving, and shopping   PERSONAL FACTORS COPD, bipolar disorder, PTSD,  Rt THA 8/ 2020. Left THA 03/2019 are also affecting patient's functional outcome.    REHAB POTENTIAL: Good   CLINICAL DECISION MAKING: Stable/uncomplicated   EVALUATION COMPLEXITY: Low     GOALS: Goals reviewed with patient? Yes   SHORT TERM GOALS: Target date: 04/15/2022  Patient will demonstrate independent use of initial home exercise program to maintain progress from in clinic treatments. Goal status: New        2. Pt will be able to perform 5 time sit to stand in </= 15 seconds with no UE support:             A. Goal Status: New       Long term PT goals (target date: 06/14/2022  ) Patient will demonstrate/report pain at worst less than or equal to 2/10 to facilitate minimal limitation in daily activity secondary to pain symptoms. Goal status: New   Patient will demonstrate independent use of home exercise program to facilitate ability to maintain/progress functional gains from skilled physical therapy services. Goal status: New   Patient will demonstrate FOTO outcome > or = 53 % to indicate reduced disability due to condition. Goal status: New   Pt will be able to navigate 1 flight of stairs with step over  step pattern with single hand rail.  Goal status: New       5.  Pt will be able to amb with no device > 500 feet on community surfaces including curb step with no device.   Goal status: New     PLAN: PT FREQUENCY:  2-3x/ week    PT DURATION: 12 weeks   PLANNED INTERVENTIONS: Therapeutic exercises, Therapeutic activity, Neuromuscular re-education, Balance training, Gait training, Patient/Family education, Joint mobilization, Stair training, Dry Needling, Electrical stimulation, Cryotherapy, Moist heat, Taping, Vasopneumatic device, and Manual therapy   PLAN FOR NEXT SESSION: monitor SOB/O2, LE strengthening, Step ups, Leg Press    Clarita Crane, PT, DPT 03/27/22 1:41 PM

## 2022-03-28 NOTE — Telephone Encounter (Signed)
Message routed to Dr. Mannam to advise °

## 2022-03-28 NOTE — Telephone Encounter (Signed)
[facility] Patient Advocate Encounter  Received notification that the request for prior authorization for Albuterol Sulfate HFA 108 (90 Base)MCG/ACT aerosol has been denied due to patient not having tried at least two (2) drugs on the formulary to treat the diagnosis. It is documented the patient tried Ventolin HFA but the patient needs to try one more drug. (Levalbuterol)

## 2022-03-29 MED ORDER — LEVALBUTEROL TARTRATE 45 MCG/ACT IN AERO: 1.0000 | INHALATION_SPRAY | Freq: Four times a day (QID) | RESPIRATORY_TRACT | 11 refills | Status: DC | PRN

## 2022-04-01 ENCOUNTER — Encounter: Payer: Self-pay | Admitting: Pulmonary Disease

## 2022-04-01 ENCOUNTER — Ambulatory Visit (INDEPENDENT_AMBULATORY_CARE_PROVIDER_SITE_OTHER): Payer: Medicare Other | Admitting: Pulmonary Disease

## 2022-04-01 VITALS — BP 120/62 | HR 95 | Temp 98.3°F | Ht 70.0 in | Wt 208.2 lb

## 2022-04-01 DIAGNOSIS — J449 Chronic obstructive pulmonary disease, unspecified: Secondary | ICD-10-CM

## 2022-04-01 MED ORDER — TRELEGY ELLIPTA 200-62.5-25 MCG/ACT IN AEPB: 1.0000 | INHALATION_SPRAY | Freq: Every day | RESPIRATORY_TRACT | 0 refills | Status: DC

## 2022-04-01 NOTE — Progress Notes (Signed)
Judy [name]    [id number]    1951/04/05  Primary Care Physician:No primary care provider on file.  Referring Physician: Wynn Banker, MD [address] [place],  [place] [address]  Chief complaint: Follow up for COPD  HPI: 71 year old with anxiety, PTSD, bipolar, COPD, asthma  Complains of dyspnea on exertion for the past 1 year.  She has symptoms on exertion, mild symptoms at rest, daily cough with clear mucus, sinus drainage.  She has had multiple hospitalizations over the past year for COPD exacerbations.  Given inhalers including Advair and Symbicort but does not feel it is helping.  Previously evaluated by pulmonary at [facility] and is here for second opinion.  Hospitalized in July 2018 for COPD exacerbation Hospitalized in November 2018 for COPD exacerbation, CT during both admissions were is negative for PE but showed bilateral groundglass opacities.  She was treated with antibiotics, oral prednisone.  Virus panel was positive for adenovirus at that time.  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 Smoking history: 50-pack-year smoker.  Quit smoking in early 2022 Travel history: Originally  from [place].  No significant recent travel Relevant family history: Daughter has asthma.  No other significant family history of lung disease.  Interim history: She is getting samples of Trelegy as she cannot afford insurance.  She also tried breztri samples but prefers trelegy over breztri  Underwent left knee total replacement in March 2023 without any respiratory issue.  She did have a COPD exacerbation from viral illness while in rehab and was treated with antibiotics and prednisone.   Outpatient Encounter Medications as of 04/01/2022  Medication Sig   albuterol (PROVENTIL) (2.5 MG/3ML) 0.083% nebulizer solution Take 3  mLs (2.5 mg total) by nebulization every 6 (six) hours as needed for wheezing or shortness of breath.   Budeson-Glycopyrrol-Formoterol (BREZTRI AEROSPHERE) 160-9-4.8 MCG/ACT AERO Inhale 2 puffs into the lungs in the morning and at bedtime. [id number]; [id number]   Cholecalciferol (VITAMIN D3) 50 MCG (2000 UT) TABS Take 1 tablet by mouth in the morning.   Cyanocobalamin (VITAMIN B-12 PO) Take 2,500 mcg by mouth in the morning.   fluconazole (DIFLUCAN) 100 MG tablet Take 1 tablet (100 mg total) by mouth daily.   levalbuterol (XOPENEX HFA) 45 MCG/ACT inhaler Inhale 1 puff into the lungs every 6 (six) hours as needed for wheezing or shortness of breath.   QUEtiapine (SEROQUEL) 400 MG tablet Take 400 mg by mouth at bedtime.   Spacer/Aero-Holding Chambers DEVI 1 Device by Does not apply route daily as needed.   [DISCONTINUED] HYDROcodone-acetaminophen (NORCO) 7.5-325 MG tablet Take 1 tablet by mouth 3 (three) times daily as needed for moderate pain.   [DISCONTINUED] ipratropium (ATROVENT) 0.02 % nebulizer solution Take 2.5 mLs (0.5 mg total) by nebulization 4 (four) times daily.   Fluticasone-Umeclidin-Vilant (TRELEGY ELLIPTA) 200-62.5-25 MCG/ACT AEPB Inhale 1 puff into the lungs daily. (Patient not taking: Reported on 04/01/2022)   No facility-administered encounter medications on file as of 04/01/2022.   Physical Exam: Blood pressure 120/62, pulse 95, temperature 98.3 F (36.8 C), temperature source Oral, height 5\' 10"  (1.778 m), weight 208 lb 3.2 oz (94.4 kg), SpO2 96 %. Gen:      No acute distress HEENT:  EOMI, sclera anicteric Neck:     No masses; no thyromegaly Lungs:  Clear to auscultation bilaterally; normal respiratory effort CV:         Regular rate and rhythm; no murmurs Abd:      + bowel sounds; soft, non-tender; no palpable masses, no distension Ext:    No edema; adequate peripheral perfusion Skin:      Warm and dry; no rash Neuro: alert and oriented x 3 Psych: normal mood and affect    Data Reviewed: Imaging: CTA 04/15/2017-no pulmonary embolism, patchy upper lobe groundglass opacities CTA 08/23/2018-no PE, patchy bilateral groundglass opacities, predominantly in the bases. High-resolution CT 08/14/2018-no ILD, bronchial wall thickening with atherosclerosis, small pulmonary nodules Screening CT chest 11/01/2020-mild emphysema, stable pulmonary nodules. Chest x-ray 02/03/22 chronic appearing interstitial markings  I have reviewed the images personally.  PFTs: Spirometry 05/18/18 FEV1 1.76, F/F 65.  Moderate obstruction.  08/10/18 FVC 3.31 [94%), FEV1 2.25 [83%), F/F 68, TLC 83%, DLCO 61% Mild obstruction with moderate diffusion defect.  FENO 08/05/2018-8  Labs: CBC 08/25/2017-WBC 9.6, eos 0% CBC 08/05/2018- WBC 9.1, eos 0.6%, absolute eosinophil count 55  IgE 08/10/2018-79 Alpha-1 antitrypsin 08/10/2018-192, PI MM  Assessment:  COPD Acute visit for exacerbation PFTs reviewed with mild obstruction, no bronchodilator response.  Labs show normal IgE and low peripheral eosinophils. She has a diagnosis of asthma but this looks more like COPD.  Currently using breztri samples but would like to go back to trelegy.  She is working with primary care to get on some kind of patient assistance.  Abnormal CT, nodules Prior CTs noted with patchy bilateral groundglass opacities.  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  Follow-up high res CT does not show interstitial lung disease. Continue annual low-dose screening CT chest.  She is overdue for her CT this year as she did not return the scheduling phone call  Health maintenance 08/27/2016-Pneumovax  Plan/Recommendations: - Continue trelegy - Low-dose screening CT chest  Chilton Greathouse MD  Pulmonary and Critical Care 04/01/2022, 2:44 PM  CC: Wynn Banker, MD

## 2022-04-02 ENCOUNTER — Ambulatory Visit (INDEPENDENT_AMBULATORY_CARE_PROVIDER_SITE_OTHER): Payer: Medicare Other | Admitting: Physical Therapy

## 2022-04-02 ENCOUNTER — Telehealth: Payer: Self-pay | Admitting: *Deleted

## 2022-04-02 ENCOUNTER — Encounter: Payer: Self-pay | Admitting: Physical Therapy

## 2022-04-02 DIAGNOSIS — R600 Localized edema: Secondary | ICD-10-CM

## 2022-04-02 DIAGNOSIS — M25562 Pain in left knee: Secondary | ICD-10-CM | POA: Diagnosis not present

## 2022-04-02 DIAGNOSIS — M6281 Muscle weakness (generalized): Secondary | ICD-10-CM

## 2022-04-02 DIAGNOSIS — M25662 Stiffness of left knee, not elsewhere classified: Secondary | ICD-10-CM | POA: Diagnosis not present

## 2022-04-02 DIAGNOSIS — R262 Difficulty in walking, not elsewhere classified: Secondary | ICD-10-CM | POA: Diagnosis not present

## 2022-04-04 ENCOUNTER — Encounter: Payer: Self-pay | Admitting: Physical Therapy

## 2022-04-04 ENCOUNTER — Ambulatory Visit (INDEPENDENT_AMBULATORY_CARE_PROVIDER_SITE_OTHER): Payer: Medicare Other | Admitting: Physical Therapy

## 2022-04-04 ENCOUNTER — Telehealth: Payer: Self-pay | Admitting: Orthopaedic Surgery

## 2022-04-04 DIAGNOSIS — M6281 Muscle weakness (generalized): Secondary | ICD-10-CM

## 2022-04-04 DIAGNOSIS — M25562 Pain in left knee: Secondary | ICD-10-CM

## 2022-04-04 DIAGNOSIS — M25662 Stiffness of left knee, not elsewhere classified: Secondary | ICD-10-CM

## 2022-04-04 DIAGNOSIS — R262 Difficulty in walking, not elsewhere classified: Secondary | ICD-10-CM | POA: Diagnosis not present

## 2022-04-04 DIAGNOSIS — R600 Localized edema: Secondary | ICD-10-CM

## 2022-04-04 NOTE — Telephone Encounter (Signed)
Judy [name]'s dental office called requesting some form of documentation stating patient does not need antibiotics for routine dental cleaning so they can add to her chart.   Fax#: [phone]

## 2022-04-04 NOTE — Telephone Encounter (Signed)
Noted. Closing this encounter as PA was done in separate encounter.

## 2022-04-04 NOTE — Therapy (Signed)
OUTPATIENT PHYSICAL THERAPY TREATMENT NOTE   Patient Name: Judy [name] MRN: [id number] DOB:1950/10/10, 71 y.o., female Today's Date: 04/04/2022   END OF SESSION:   PT End of Session - 04/04/22 1357     Visit Number 4    Number of Visits 24    Date for PT Re-Evaluation 06/14/22    Authorization Type KX modifier after 15 visits    Progress Note Due on Visit 10    PT Start Time 1350    PT Stop Time 1430    PT Time Calculation (min) 40 min    Activity Tolerance Patient tolerated treatment well    Behavior During Therapy WFL for tasks assessed/performed               Past Medical History:  Diagnosis Date   Anxiety    Arthritis    Avascular necrosis ([place])    Left hip   Avascular necrosis of bone of hip, left ([place]) 12/28/2018   Avascular necrosis of bone of right hip ([place]) 04/28/2019   Bipolar 1 disorder ([place])    COPD (chronic obstructive pulmonary disease) ([place])    Depression    Dyspnea    Pneumonia    PTSD (post-traumatic stress disorder)    Past Surgical History:  Procedure Laterality Date   bone spurs foot Right    CATARACT EXTRACTION Bilateral    COLONOSCOPY     HAND SURGERY Left    JOINT REPLACEMENT     TONSILLECTOMY     TONSILLECTOMY AND ADENOIDECTOMY     TOTAL HIP ARTHROPLASTY Left 03/19/2019   Procedure: LEFT TOTAL HIP ARTHROPLASTY ANTERIOR APPROACH;  Surgeon: Mcarthur Rossetti, MD;  Location: [facility];  Service: Orthopedics;  Laterality: Left;   TOTAL HIP ARTHROPLASTY Right 06/04/2019   Procedure: RIGHT TOTAL HIP ARTHROPLASTY ANTERIOR APPROACH;  Surgeon: Mcarthur Rossetti, MD;  Location: [facility];  Service: Orthopedics;  Laterality: Right;   TOTAL KNEE ARTHROPLASTY Left 12/28/2021   Procedure: Left TOTAL KNEE ARTHROPLASTY;  Surgeon: Mcarthur Rossetti, MD;  Location: [facility];  Service: Orthopedics;  Laterality: Left;   Patient Active Problem List   Diagnosis Date Noted   Status post total left knee replacement 12/28/2021   Primary  osteoarthritis of left knee 10/25/2021   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 ([place]) 11/30/2020   Status post total replacement of right hip 06/04/2019   Status post total replacement of left hip 03/19/2019   PTSD (post-traumatic stress disorder)    COPD (chronic obstructive pulmonary disease) ([facility])    Bipolar 1 disorder ([place])    Anxiety    B12 deficiency 12/09/2018   Hyperglycemia 04/14/2017     THERAPY DIAG:  Acute pain of left knee  Stiffness of left knee, not elsewhere classified  Muscle weakness (generalized)  Difficulty in walking, not elsewhere classified  Localized edema  PCP: Caren Macadam, MD   REFERRING PROVIDER: Mcarthur Rossetti, MD   REFERRING DIAG: [phone] (ICD-10-CM) - Status post total left knee replacement   Rationale for Evaluation and Treatment Rehabilitation   ONSET DATE: TKA 12/28/2021   SUBJECTIVE:    SUBJECTIVE STATEMENT: Everything hurts; hasn't had rheumatology infusion in 5-6 months    PERTINENT HISTORY: COPD, PTSD,  Rt THA 8/ 2020. Left THA 03/2019   PAIN:  Are you having pain? Yes: NPRS scale: 3-4/10 Pain location: left knee medial knee joint Pain description: achy Aggravating factors: walking, standing, bending Relieving factors: pain meds, ice   PRECAUTIONS: None  OBJECTIVE:      PATIENT SURVEYS:  03/18/2022: FOTO 38% (predicted 53%)   EDEMA:  03/18/2022: Circumferential: Rt knee: 38 centimeters                                             Left knee 40 centimeters   MUSCLE LENGTH: 03/18/2022:  Hamstrings: Right 82 deg; Left 75 deg   PALPATION: 03/18/2022: Tender around incision site and medial joint line of left knee   LOWER EXTREMITY ROM:   Active ROM Right eval Left eval Left 04/02/22  Knee flexion 135 128 128  Knee extension -6 -10 -8   (Blank rows = not tested)   PROM:  PROM:  03/18/2022 04/02/22     Left Left   Knee flexion 134 134  Knee extension -6         -5      LOWER  EXTREMITY MMT:   MMT Right Eval sitting Left Eval sitting  Hip flexion 28.2 lbs 32.4 lbs  Knee flexion 32.2 lbs 21.1 lbs  Knee extension 34.7 lbs 40.3 lbs   (Blank rows = not tested)      FUNCTIONAL TESTS:  03/18/2022: 5 times sit to stand: 17 seconds UE support 04/02/22: 5 times sit to stand: 16 seconds UE support   GAIT: 03/18/2022:  Distance walked: 30 feet  Assistive device utilized: Single point cane Level of assistance: Modified independence Comments: amb with antalgic gait        TODAY'S TREATMENT: 04/04/22 Therex:      Aerobic: Recumbent bike L3 x 5 min     Machines: Knee extension 5# single limb 2x10 bil Knee flexion 10# single limb 2x10 bil Leg press: bil LE's : 50# 2 x 10 Leg press: Left LE only:  25# 2 x 10    Standing: Calf/toe raises x 20 reps  Neuro Re-Ed: Forward step ups onto foam with 1 UE support x 10 reps bil Tandem stance with intermittent UE support 2x15 sec bil  04/02/22 Therex:      Aerobic: Recumbent bike L3 x 5 min     Machines: Knee extension 5# single limb 2x10 bil Knee flexion 10# single limb 2x10 bil Leg press: bil LE's : 50# 2 x 10  Leg press: Left LE only:  25# 2 x 10     Sitting: LAQ 5# 2x10 bil Hamstring curl L4 2x10 bil Hip abduction L4 band 2 x 10 reps       Standing Forward step ups onto 6" step x 10 reps bil and 1 UE support Calf stretch on slant board x 3 holding 20 seconds   03/27/22 Therex:      Aerobic: Recumbent bike L3 x 5 min     Machines: Knee extension 5# single limb 2x10 bil Knee flexion 10# single limb 2x10 bil     Sitting: Sit to/from stand x 10 reps on elevated mat table LAQ 5# 2x10 bil Hamstring curl L4 2x10 bil Hip adduction isometrics 10 x 5 sec Hip abduction L4 band x 10 reps       Standing Forward step ups onto 4" step x 10 reps bil and 1 UE support  PATIENT EDUCATION:  Education details: PT POC, HEP Person educated: Patient Education method: Explanation, Demonstration, Tactile cues,  Verbal cues, and Handouts Education comprehension: verbalized understanding and returned demonstration     HOME EXERCISE PROGRAM:  Access Code: [email or web address] URL: [email or web address] Date: 03/18/2022 Prepared by: Kearney Hard   Exercises - Supine Bridge  - 2 x daily - 7 x weekly - 2 sets - 10 reps - 3-5 hold - Sit to Stand with Counter Support  - 2 x daily - 7 x weekly - 10 reps - Heel Raises with Counter Support  - 1 x daily - 7 x weekly - 2 sets - 10 reps - Seated Long Arc Quad  - 2 x daily - 7 x weekly - 2 sets - 10 reps - 3-5 seconds hold - Standing Hip Abduction with Counter Support  - 2 x daily - 7 x weekly - 2 sets - 10 reps - Standing Hip Extension with Counter Support  - 2 x daily - 7 x weekly - 2 sets - 10 reps - Seated Passive Knee Extension  - 1 x daily - 7 x weekly - 5-10 reps - 5 seconds hold   ASSESSMENT:   CLINICAL IMPRESSION: Overall pt doing well for PT following TKA.  She continues to be limited by SOB although vitals are stable with O2 > 95% on RA.  Seated rest breaks needed throughout session due to SOB.  May be a good candidate for pulmonary rehab after d/c from PT, but unsure if she would qualify without hospital admission.  Will continue to benefit from PT to maximize function.   OBJECTIVE IMPAIRMENTS Abnormal gait, decreased balance, decreased mobility, difficulty walking, decreased ROM, decreased strength, increased edema, impaired flexibility, and pain.    ACTIVITY LIMITATIONS bending, sitting, standing, squatting, sleeping, and stairs   PARTICIPATION LIMITATIONS: cleaning, driving, and shopping   PERSONAL FACTORS COPD, bipolar disorder, PTSD,  Rt THA 8/ 2020. Left THA 03/2019 are also affecting patient's functional outcome.    REHAB POTENTIAL: Good   CLINICAL DECISION MAKING: Stable/uncomplicated   EVALUATION COMPLEXITY: Low     GOALS: Goals reviewed with patient? Yes   SHORT TERM GOALS: Target date: 04/15/2022  Patient will  demonstrate independent use of initial home exercise program to maintain progress from in clinic treatments. Goal status: MET 04/02/22        2. Pt will be able to perform 5 time sit to stand in </= 15 seconds with no UE support:             A. Goal Status: Ongoing 04/02/22       Long term PT goals (target date: 06/14/2022  ) Patient will demonstrate/report pain at worst less than or equal to 2/10 to facilitate minimal limitation in daily activity secondary to pain symptoms. Goal status: New   Patient will demonstrate independent use of home exercise program to facilitate ability to maintain/progress functional gains from skilled physical therapy services. Goal status: New   Patient will demonstrate FOTO outcome > or = 53 % to indicate reduced disability due to condition. Goal status: New   Pt will be able to navigate 1 flight of stairs with step over step pattern with single hand rail.  Goal status: New       5.  Pt will be able to amb with no device > 500 feet on community surfaces including curb step with no device.   Goal status: New     PLAN: PT FREQUENCY:  2-3x/ week    PT DURATION: 12 weeks   PLANNED INTERVENTIONS: Therapeutic exercises, Therapeutic activity, Neuromuscular re-education, Balance training, Gait training, Patient/Family education, Joint mobilization, Stair training, Dry Needling, Electrical stimulation, Cryotherapy, Moist heat, Taping, Vasopneumatic device,  and Manual therapy   PLAN FOR NEXT SESSION: monitor SOB/O2, LE strengthening, Step ups, Leg Press, work on endurance and standing tolerance; balance     Laureen Abrahams, PT, DPT 04/04/22 2:38 PM

## 2022-04-05 ENCOUNTER — Other Ambulatory Visit: Payer: Self-pay | Admitting: *Deleted

## 2022-04-05 DIAGNOSIS — Z122 Encounter for screening for malignant neoplasm of respiratory organs: Secondary | ICD-10-CM

## 2022-04-05 DIAGNOSIS — Z87891 Personal history of nicotine dependence: Secondary | ICD-10-CM

## 2022-04-05 NOTE — Telephone Encounter (Signed)
Faxed to provided number  

## 2022-04-08 ENCOUNTER — Ambulatory Visit (INDEPENDENT_AMBULATORY_CARE_PROVIDER_SITE_OTHER): Payer: Medicare Other | Admitting: Physical Therapy

## 2022-04-08 ENCOUNTER — Encounter: Payer: Self-pay | Admitting: Physical Therapy

## 2022-04-08 DIAGNOSIS — M25562 Pain in left knee: Secondary | ICD-10-CM

## 2022-04-08 DIAGNOSIS — M25662 Stiffness of left knee, not elsewhere classified: Secondary | ICD-10-CM

## 2022-04-08 DIAGNOSIS — M6281 Muscle weakness (generalized): Secondary | ICD-10-CM

## 2022-04-08 DIAGNOSIS — R262 Difficulty in walking, not elsewhere classified: Secondary | ICD-10-CM | POA: Diagnosis not present

## 2022-04-08 DIAGNOSIS — R600 Localized edema: Secondary | ICD-10-CM

## 2022-04-08 NOTE — Therapy (Signed)
OUTPATIENT PHYSICAL THERAPY TREATMENT NOTE   Patient Name: Judy [name] MRN: [id number] DOB:[birth year], 71 y.o., female Today's Date: 04/08/2022   END OF SESSION:   PT End of Session - 04/08/22 1519     Visit Number 5    Number of Visits 24    Date for PT Re-Evaluation 06/14/22    Authorization Type KX modifier after 15 visits    Progress Note Due on Visit 10    PT Start Time 1516    PT Stop Time 1554    PT Time Calculation (min) 38 min    Activity Tolerance Patient tolerated treatment well    Behavior During Therapy WFL for tasks assessed/performed                Past Medical History:  Diagnosis Date   Anxiety    Arthritis    Avascular necrosis ([place])    Left hip   Avascular necrosis of bone of hip, left ([place]) 12/28/2018   Avascular necrosis of bone of right hip ([place]) 04/28/2019   Bipolar 1 disorder ([facility])    COPD (chronic obstructive pulmonary disease) ([place])    Depression    Dyspnea    Pneumonia    PTSD (post-traumatic stress disorder)    Past Surgical History:  Procedure Laterality Date   bone spurs foot Right    CATARACT EXTRACTION Bilateral    COLONOSCOPY     HAND SURGERY Left    JOINT REPLACEMENT     TONSILLECTOMY     TONSILLECTOMY AND ADENOIDECTOMY     TOTAL HIP ARTHROPLASTY Left 03/19/2019   Procedure: LEFT TOTAL HIP ARTHROPLASTY ANTERIOR APPROACH;  Surgeon: Mcarthur Rossetti, MD;  Location: [facility];  Service: Orthopedics;  Laterality: Left;   TOTAL HIP ARTHROPLASTY Right 06/04/2019   Procedure: RIGHT TOTAL HIP ARTHROPLASTY ANTERIOR APPROACH;  Surgeon: Mcarthur Rossetti, MD;  Location: [facility];  Service: Orthopedics;  Laterality: Right;   TOTAL KNEE ARTHROPLASTY Left 12/28/2021   Procedure: Left TOTAL KNEE ARTHROPLASTY;  Surgeon: Mcarthur Rossetti, MD;  Location: [facility];  Service: Orthopedics;  Laterality: Left;   Patient Active Problem List   Diagnosis Date Noted   Status post total left knee replacement 12/28/2021   Primary  osteoarthritis of left knee 10/25/2021   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 ([place]) 11/30/2020   Status post total replacement of right hip 06/04/2019   Status post total replacement of left hip 03/19/2019   PTSD (post-traumatic stress disorder)    COPD (chronic obstructive pulmonary disease) ([facility])    Bipolar 1 disorder ([place])    Anxiety    B12 deficiency 12/09/2018   Hyperglycemia 04/14/2017     THERAPY DIAG:  Acute pain of left knee  Stiffness of left knee, not elsewhere classified  Muscle weakness (generalized)  Difficulty in walking, not elsewhere classified  Localized edema  PCP: Caren Macadam, MD   REFERRING PROVIDER: Mcarthur Rossetti, MD   REFERRING DIAG: [id number] (ICD-10-CM) - Status post total left knee replacement   Rationale for Evaluation and Treatment Rehabilitation   ONSET DATE: TKA 12/28/2021   SUBJECTIVE:    SUBJECTIVE STATEMENT: Got some sleep last night, inhalers are approved by insurance but doesn't have them yet    PERTINENT HISTORY: COPD, PTSD,  Rt THA 8/ 2020. Left THA 03/2019   PAIN:  Are you having pain? Yes: NPRS scale: 1/10 Pain location: left knee medial knee joint Pain description: achy Aggravating factors: walking, standing, bending Relieving factors: pain meds, ice  PRECAUTIONS: None     OBJECTIVE:      PATIENT SURVEYS:  03/18/2022: FOTO 38% (predicted 53%)   EDEMA:  03/18/2022: Circumferential: Rt knee: 38 centimeters                                             Left knee 40 centimeters   MUSCLE LENGTH: 03/18/2022:  Hamstrings: Right 82 deg; Left 75 deg   PALPATION: 03/18/2022: Tender around incision site and medial joint line of left knee   LOWER EXTREMITY ROM:   Active ROM Right eval Left eval Left 04/02/22  Knee flexion 135 128 128  Knee extension -6 -10 -8   (Blank rows = not tested)   PROM:  PROM:  03/18/2022 04/02/22     Left Left   Knee flexion 134 134  Knee extension -6         -5       LOWER EXTREMITY MMT:   MMT Right Eval sitting Left Eval sitting  Hip flexion 28.2 lbs 32.4 lbs  Knee flexion 32.2 lbs 21.1 lbs  Knee extension 34.7 lbs 40.3 lbs   (Blank rows = not tested)      FUNCTIONAL TESTS:  03/18/2022: 5 times sit to stand: 17 seconds UE support 04/02/22: 5 times sit to stand: 16 seconds UE support 04/08/22: 5 times sit to stand: 14.69 sec with UE support   GAIT: 03/18/2022:  Distance walked: 30 feet  Assistive device utilized: Single point cane Level of assistance: Modified independence Comments: amb with antalgic gait        TODAY'S TREATMENT: 04/08/22 Therex:      Aerobic: NuStep L6 x 8 min     Machines: Knee extension 10# single limb x10 bil, then bil x 10 reps Knee flexion 15# single limb x10 bil; then bil x 10 reps Leg press: bil LE's: 56# 3 x 10 Leg press: Left LE only:  25# 3 x 10    Standing: Forward step ups onto 6" step x 10 reps bil Calf/toe raises x 20 reps    Sitting Lt SLR 2x10 - mod cues for quad control   04/04/22 Therex:      Aerobic: Recumbent bike L3 x 5 min     Machines: Knee extension 5# single limb 2x10 bil Knee flexion 10# single limb 2x10 bil Leg press: bil LE's : 50# 2 x 10 Leg press: Left LE only:  25# 2 x 10    Standing: Calf/toe raises x 20 reps  Neuro Re-Ed: Forward step ups onto foam with 1 UE support x 10 reps bil Tandem stance with intermittent UE support 2x15 sec bil  04/02/22 Therex:      Aerobic: Recumbent bike L3 x 5 min     Machines: Knee extension 5# single limb 2x10 bil Knee flexion 10# single limb 2x10 bil Leg press: bil LE's : 50# 2 x 10  Leg press: Left LE only:  25# 2 x 10     Sitting: LAQ 5# 2x10 bil Hamstring curl L4 2x10 bil Hip abduction L4 band 2 x 10 reps       Standing Forward step ups onto 6" step x 10 reps bil and 1 UE support Calf stretch on slant board x 3 holding 20 seconds  PATIENT EDUCATION:  Education details: PT POC, HEP Person educated: Patient Education  method: Explanation, Demonstration, Tactile cues, Verbal  cues, and Handouts Education comprehension: verbalized understanding and returned demonstration     HOME EXERCISE PROGRAM: Access Code: [email or web address] URL: [email or web address] Date: 03/18/2022 Prepared by: Kearney Hard   Exercises - Supine Bridge  - 2 x daily - 7 x weekly - 2 sets - 10 reps - 3-5 hold - Sit to Stand with Counter Support  - 2 x daily - 7 x weekly - 10 reps - Heel Raises with Counter Support  - 1 x daily - 7 x weekly - 2 sets - 10 reps - Seated Long Arc Quad  - 2 x daily - 7 x weekly - 2 sets - 10 reps - 3-5 seconds hold - Standing Hip Abduction with Counter Support  - 2 x daily - 7 x weekly - 2 sets - 10 reps - Standing Hip Extension with Counter Support  - 2 x daily - 7 x weekly - 2 sets - 10 reps - Seated Passive Knee Extension  - 1 x daily - 7 x weekly - 5-10 reps - 5 seconds hold   ASSESSMENT:   CLINICAL IMPRESSION: Pt has demonstrated improvement in functional strength meeting STG #2 at this time.  Overall better tolerance to exercise noted today with decreased SOB reported.  She still needs occasional rest breaks due to SOB.  Will continue to benefit from PT to maximize function.    OBJECTIVE IMPAIRMENTS Abnormal gait, decreased balance, decreased mobility, difficulty walking, decreased ROM, decreased strength, increased edema, impaired flexibility, and pain.    ACTIVITY LIMITATIONS bending, sitting, standing, squatting, sleeping, and stairs   PARTICIPATION LIMITATIONS: cleaning, driving, and shopping   PERSONAL FACTORS COPD, bipolar disorder, PTSD,  Rt THA 8/ 2020. Left THA 03/2019 are also affecting patient's functional outcome.    REHAB POTENTIAL: Good   CLINICAL DECISION MAKING: Stable/uncomplicated   EVALUATION COMPLEXITY: Low     GOALS: Goals reviewed with patient? Yes   SHORT TERM GOALS: Target date: 04/15/2022  Patient will demonstrate independent use of initial home exercise  program to maintain progress from in clinic treatments. Goal status: MET 04/02/22        2. Pt will be able to perform 5 time sit to stand in </= 15 seconds with no UE support:             A. Goal Status: MET 04/08/22       Long term PT goals (target date: 06/14/2022  ) Patient will demonstrate/report pain at worst less than or equal to 2/10 to facilitate minimal limitation in daily activity secondary to pain symptoms. Goal status: New   Patient will demonstrate independent use of home exercise program to facilitate ability to maintain/progress functional gains from skilled physical therapy services. Goal status: New   Patient will demonstrate FOTO outcome > or = 53 % to indicate reduced disability due to condition. Goal status: New   Pt will be able to navigate 1 flight of stairs with step over step pattern with single hand rail.  Goal status: New       5.  Pt will be able to amb with no device > 500 feet on community surfaces including curb step with no device.   Goal status: New     PLAN: PT FREQUENCY:  2-3x/ week    PT DURATION: 12 weeks   PLANNED INTERVENTIONS: Therapeutic exercises, Therapeutic activity, Neuromuscular re-education, Balance training, Gait training, Patient/Family education, Joint mobilization, Stair training, Dry Needling, Electrical stimulation, Cryotherapy, Moist heat, Taping, Vasopneumatic device, and Manual therapy  PLAN FOR NEXT SESSION: monitor SOB/O2, LE strengthening, Step ups, Leg Press, work on endurance and standing tolerance; balance (simulated walking in sand if possible)     Laureen Abrahams, PT, DPT 04/08/22 3:55 PM

## 2022-04-10 ENCOUNTER — Encounter: Payer: Self-pay | Admitting: Physical Therapy

## 2022-04-10 ENCOUNTER — Ambulatory Visit (INDEPENDENT_AMBULATORY_CARE_PROVIDER_SITE_OTHER): Payer: Medicare Other | Admitting: Physical Therapy

## 2022-04-10 DIAGNOSIS — M25662 Stiffness of left knee, not elsewhere classified: Secondary | ICD-10-CM | POA: Diagnosis not present

## 2022-04-10 DIAGNOSIS — M6281 Muscle weakness (generalized): Secondary | ICD-10-CM | POA: Diagnosis not present

## 2022-04-10 DIAGNOSIS — R262 Difficulty in walking, not elsewhere classified: Secondary | ICD-10-CM | POA: Diagnosis not present

## 2022-04-10 DIAGNOSIS — M25562 Pain in left knee: Secondary | ICD-10-CM | POA: Diagnosis not present

## 2022-04-10 DIAGNOSIS — R600 Localized edema: Secondary | ICD-10-CM

## 2022-04-10 NOTE — Therapy (Signed)
OUTPATIENT PHYSICAL THERAPY TREATMENT NOTE   Patient Name: Judy [name] MRN: [id number] DOB:14-May-1951, 71 y.o., female Today's Date: 04/10/2022   END OF SESSION:   PT End of Session - 04/10/22 1446     Visit Number 6    Number of Visits 24    Date for PT Re-Evaluation 06/14/22    Authorization Type KX modifier after 15 visits    Progress Note Due on Visit 10    PT Start Time 1438   pt arrived late   PT Stop Time 1506    PT Time Calculation (min) 28 min    Activity Tolerance Patient limited by fatigue    Behavior During Therapy [facility] for tasks assessed/performed                 Past Medical History:  Diagnosis Date   Anxiety    Arthritis    Avascular necrosis ([place])    Left hip   Avascular necrosis of bone of hip, left ([place]) 12/28/2018   Avascular necrosis of bone of right hip ([place]) 04/28/2019   Bipolar 1 disorder (HCC)    COPD (chronic obstructive pulmonary disease) ([place])    Depression    Dyspnea    Pneumonia    PTSD (post-traumatic stress disorder)    Past Surgical History:  Procedure Laterality Date   bone spurs foot Right    CATARACT EXTRACTION Bilateral    COLONOSCOPY     HAND SURGERY Left    JOINT REPLACEMENT     TONSILLECTOMY     TONSILLECTOMY AND ADENOIDECTOMY     TOTAL HIP ARTHROPLASTY Left 03/19/2019   Procedure: LEFT TOTAL HIP ARTHROPLASTY ANTERIOR APPROACH;  Surgeon: Mcarthur Rossetti, MD;  Location: [facility];  Service: Orthopedics;  Laterality: Left;   TOTAL HIP ARTHROPLASTY Right 06/04/2019   Procedure: RIGHT TOTAL HIP ARTHROPLASTY ANTERIOR APPROACH;  Surgeon: Mcarthur Rossetti, MD;  Location: [facility];  Service: Orthopedics;  Laterality: Right;   TOTAL KNEE ARTHROPLASTY Left 12/28/2021   Procedure: Left TOTAL KNEE ARTHROPLASTY;  Surgeon: Mcarthur Rossetti, MD;  Location: [facility];  Service: Orthopedics;  Laterality: Left;   Patient Active Problem List   Diagnosis Date Noted   Status post total left knee replacement  12/28/2021   Primary osteoarthritis of left knee 10/25/2021   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 ([place]) 11/30/2020   Status post total replacement of right hip 06/04/2019   Status post total replacement of left hip 03/19/2019   PTSD (post-traumatic stress disorder)    COPD (chronic obstructive pulmonary disease) (HCC)    Bipolar 1 disorder ([place])    Anxiety    B12 deficiency 12/09/2018   Hyperglycemia 04/14/2017     THERAPY DIAG:  Acute pain of left knee  Stiffness of left knee, not elsewhere classified  Muscle weakness (generalized)  Difficulty in walking, not elsewhere classified  Localized edema  PCP: Caren Macadam, MD   REFERRING PROVIDER: Mcarthur Rossetti, MD   REFERRING DIAG: [phone] (ICD-10-CM) - Status post total left knee replacement   Rationale for Evaluation and Treatment Rehabilitation   ONSET DATE: TKA 12/28/2021   SUBJECTIVE:    SUBJECTIVE STATEMENT: Doing pretty well. Did a lot of walking yesterday.  PERTINENT HISTORY: COPD, PTSD,  Rt THA 8/ 2020. Left THA 03/2019   PAIN:  Are you having pain? Yes: NPRS scale: 1/10 Pain location: left knee medial knee joint Pain description: achy Aggravating factors: walking, standing, bending Relieving factors: pain meds, ice   PRECAUTIONS:  None     OBJECTIVE:      PATIENT SURVEYS:  03/18/2022: FOTO 38% (predicted 53%)   EDEMA:  03/18/2022: Circumferential: Rt knee: 38 centimeters                                             Left knee 40 centimeters   MUSCLE LENGTH: 03/18/2022:  Hamstrings: Right 82 deg; Left 75 deg   PALPATION: 03/18/2022: Tender around incision site and medial joint line of left knee   LOWER EXTREMITY ROM:   Active ROM Right eval Left eval Left 04/02/22  Knee flexion 135 128 128  Knee extension -6 -10 -8   (Blank rows = not tested)   PROM:  PROM:  03/18/2022 04/02/22     Left Left   Knee flexion 134 134  Knee extension -6         -5      LOWER  EXTREMITY MMT:   MMT Right Eval sitting Left Eval sitting  Hip flexion 28.2 lbs 32.4 lbs  Knee flexion 32.2 lbs 21.1 lbs  Knee extension 34.7 lbs 40.3 lbs   (Blank rows = not tested)      FUNCTIONAL TESTS:  03/18/2022: 5 times sit to stand: 17 seconds UE support 04/02/22: 5 times sit to stand: 16 seconds UE support 04/08/22: 5 times sit to stand: 14.69 sec with UE support   GAIT: 03/18/2022:  Distance walked: 30 feet  Assistive device utilized: Single point cane Level of assistance: Modified independence Comments: amb with antalgic gait        TODAY'S TREATMENT: 04/10/22 Therex:      Aerobic: NuStep L6 x 8 min     Machines: Knee extension 10# single limb x10 bil, then bil x 10 reps Knee flexion 15# single limb x10 bil; then bil x 10 reps Leg press: bil LE's: 56# 3 x 10 Leg press: Left LE only:  25# 3 x 10    Standing: Forward step ups onto foam x 10 reps bil; 1 UE support Lateral step ups onto foam x 10 reps bil; 1 UE support   04/08/22 Therex:      Aerobic: NuStep L6 x 8 min     Machines: Knee extension 10# single limb x10 bil, then bil x 10 reps Knee flexion 15# single limb x10 bil; then bil x 10 reps Leg press: bil LE's: 56# 3 x 10 Leg press: Left LE only:  25# 3 x 10    Standing: Forward step ups onto 6" step x 10 reps bil Calf/toe raises x 20 reps    Sitting Lt SLR 2x10 - mod cues for quad control   04/04/22 Therex:      Aerobic: Recumbent bike L3 x 5 min     Machines: Knee extension 5# single limb 2x10 bil Knee flexion 10# single limb 2x10 bil Leg press: bil LE's : 50# 2 x 10 Leg press: Left LE only:  25# 2 x 10    Standing: Calf/toe raises x 20 reps  Neuro Re-Ed: Forward step ups onto foam with 1 UE support x 10 reps bil Tandem stance with intermittent UE support 2x15 sec bil  PATIENT EDUCATION:  Education details: PT POC, HEP Person educated: Patient Education method: Explanation, Demonstration, Tactile cues, Verbal cues, and  Handouts Education comprehension: verbalized understanding and returned demonstration     HOME EXERCISE PROGRAM: Access  Code: [email or web address] URL: [email or web address] Date: 03/18/2022 Prepared by: Kearney Hard   Exercises - Supine Bridge  - 2 x daily - 7 x weekly - 2 sets - 10 reps - 3-5 hold - Sit to Stand with Counter Support  - 2 x daily - 7 x weekly - 10 reps - Heel Raises with Counter Support  - 1 x daily - 7 x weekly - 2 sets - 10 reps - Seated Long Arc Quad  - 2 x daily - 7 x weekly - 2 sets - 10 reps - 3-5 seconds hold - Standing Hip Abduction with Counter Support  - 2 x daily - 7 x weekly - 2 sets - 10 reps - Standing Hip Extension with Counter Support  - 2 x daily - 7 x weekly - 2 sets - 10 reps - Seated Passive Knee Extension  - 1 x daily - 7 x weekly - 5-10 reps - 5 seconds hold   ASSESSMENT:   CLINICAL IMPRESSION: Pt continues to have difficulty with compliant surface activities and will benefit from continued PT to work on balance and endurance.  Plan to continue PT for at least 3-4 more weeks.   OBJECTIVE IMPAIRMENTS Abnormal gait, decreased balance, decreased mobility, difficulty walking, decreased ROM, decreased strength, increased edema, impaired flexibility, and pain.    ACTIVITY LIMITATIONS bending, sitting, standing, squatting, sleeping, and stairs   PARTICIPATION LIMITATIONS: cleaning, driving, and shopping   PERSONAL FACTORS COPD, bipolar disorder, PTSD,  Rt THA 8/ 2020. Left THA 03/2019 are also affecting patient's functional outcome.    REHAB POTENTIAL: Good   CLINICAL DECISION MAKING: Stable/uncomplicated   EVALUATION COMPLEXITY: Low     GOALS: Goals reviewed with patient? Yes   SHORT TERM GOALS: Target date: 04/15/2022  Patient will demonstrate independent use of initial home exercise program to maintain progress from in clinic treatments. Goal status: MET 04/02/22        2. Pt will be able to perform 5 time sit to stand in </= 15  seconds with no UE support:             A. Goal Status: MET 04/08/22       Long term PT goals (target date: 06/14/2022  ) Patient will demonstrate/report pain at worst less than or equal to 2/10 to facilitate minimal limitation in daily activity secondary to pain symptoms. Goal status: New   Patient will demonstrate independent use of home exercise program to facilitate ability to maintain/progress functional gains from skilled physical therapy services. Goal status: New   Patient will demonstrate FOTO outcome > or = 53 % to indicate reduced disability due to condition. Goal status: New   Pt will be able to navigate 1 flight of stairs with step over step pattern with single hand rail.  Goal status: New       5.  Pt will be able to amb with no device > 500 feet on community surfaces including curb step with no device.   Goal status: New     PLAN: PT FREQUENCY:  2-3x/ week    PT DURATION: 12 weeks   PLANNED INTERVENTIONS: Therapeutic exercises, Therapeutic activity, Neuromuscular re-education, Balance training, Gait training, Patient/Family education, Joint mobilization, Stair training, Dry Needling, Electrical stimulation, Cryotherapy, Moist heat, Taping, Vasopneumatic device, and Manual therapy   PLAN FOR NEXT SESSION: balance assessment, compliant surface activities,  onitor SOB/O2, LE strengthening, Step ups, Leg Press, work on endurance and standing tolerance; balance (simulated walking in sand if possible)  Laureen Abrahams, PT, DPT 04/10/22 3:10 PM

## 2022-04-12 ENCOUNTER — Telehealth: Payer: Self-pay | Admitting: Pulmonary Disease

## 2022-04-12 ENCOUNTER — Telehealth: Payer: Self-pay | Admitting: *Deleted

## 2022-04-12 MED ORDER — TRELEGY ELLIPTA 100-62.5-25 MCG/ACT IN AEPB: 1.0000 | INHALATION_SPRAY | Freq: Every day | RESPIRATORY_TRACT | 3 refills | Status: DC

## 2022-04-12 NOTE — Telephone Encounter (Signed)
Attempted to contact the patient and left message for patient to call the office.  

## 2022-04-12 NOTE — Telephone Encounter (Signed)
Spoke with patient and scheduled her for Cimzia injection on 04/18/2022.

## 2022-04-12 NOTE — Telephone Encounter (Signed)
I called the patient and left a message per DPR that her refill was sent to the pharmacy and she was asked to call back with any questions.

## 2022-04-12 NOTE — Telephone Encounter (Signed)
Ok to resume maintenance dosing of cimzia in the office.  Please have patient update CBC, CMP, and TB gold.

## 2022-04-12 NOTE — Telephone Encounter (Signed)
Patient contacted the office stating the yeast infection has cleared. Patient would like to schedule her Cimzia injection. Last Injection was 04/11/2021.  Okay to schedule patient for Cimzia injection? Will patient need to do loading dose again due to gap in therapy?

## 2022-04-15 ENCOUNTER — Ambulatory Visit
Admission: RE | Admit: 2022-04-15 | Discharge: 2022-04-15 | Disposition: A | Discharge status: Home / Self Care | Payer: Medicare Other | Source: Ambulatory Visit | Attending: Acute Care | Admitting: Acute Care

## 2022-04-15 DIAGNOSIS — Z87891 Personal history of nicotine dependence: Secondary | ICD-10-CM

## 2022-04-15 DIAGNOSIS — Z122 Encounter for screening for malignant neoplasm of respiratory organs: Secondary | ICD-10-CM

## 2022-04-16 ENCOUNTER — Encounter: Payer: Self-pay | Admitting: Physical Therapy

## 2022-04-16 ENCOUNTER — Telehealth: Payer: Self-pay | Admitting: Acute Care

## 2022-04-16 ENCOUNTER — Ambulatory Visit (INDEPENDENT_AMBULATORY_CARE_PROVIDER_SITE_OTHER): Payer: Medicare Other | Admitting: Physical Therapy

## 2022-04-16 DIAGNOSIS — R600 Localized edema: Secondary | ICD-10-CM

## 2022-04-16 DIAGNOSIS — R262 Difficulty in walking, not elsewhere classified: Secondary | ICD-10-CM

## 2022-04-16 DIAGNOSIS — M6281 Muscle weakness (generalized): Secondary | ICD-10-CM

## 2022-04-16 DIAGNOSIS — M25562 Pain in left knee: Secondary | ICD-10-CM

## 2022-04-16 DIAGNOSIS — M25662 Stiffness of left knee, not elsewhere classified: Secondary | ICD-10-CM | POA: Diagnosis not present

## 2022-04-16 DIAGNOSIS — R911 Solitary pulmonary nodule: Secondary | ICD-10-CM

## 2022-04-16 DIAGNOSIS — Z87891 Personal history of nicotine dependence: Secondary | ICD-10-CM

## 2022-04-16 NOTE — Telephone Encounter (Signed)
Received a call from [facility] about patient's lung cancer screening CT. Below is a copy of the impressions:  "IMPRESSION: 1. Lung-RADS 4A, suspicious. Follow up low-dose chest CT without contrast in 3 months (please use the following order, "CT CHEST LCS NODULE FOLLOW-UP W/O CM") is recommended. Alternatively, PET may be considered when there is a solid component 54mm or larger. 2. Coronary artery calcifications. 3. Aortic Atherosclerosis (ICD10-I70.0)."  Maralyn Sago, can you please advise? Thanks!

## 2022-04-16 NOTE — Therapy (Signed)
OUTPATIENT PHYSICAL THERAPY TREATMENT NOTE   Patient Name: Judy [name] MRN: [id number] DOB:[birth year], 71 y.o., female Today's Date: 04/16/2022   END OF SESSION:   PT End of Session - 04/16/22 1532     Visit Number 7    Number of Visits 24    Date for PT Re-Evaluation 06/14/22    Authorization Type KX modifier after 15 visits    Progress Note Due on Visit 10    PT Start Time 1430    PT Stop Time 1515    PT Time Calculation (min) 45 min    Activity Tolerance Patient limited by fatigue    Behavior During Therapy [facility] for tasks assessed/performed                  Past Medical History:  Diagnosis Date   Anxiety    Arthritis    Avascular necrosis ([place])    Left hip   Avascular necrosis of bone of hip, left ([place]) 12/28/2018   Avascular necrosis of bone of right hip ([place]) 04/28/2019   Bipolar 1 disorder (HCC)    COPD (chronic obstructive pulmonary disease) ([place])    Depression    Dyspnea    Pneumonia    PTSD (post-traumatic stress disorder)    Past Surgical History:  Procedure Laterality Date   bone spurs foot Right    CATARACT EXTRACTION Bilateral    COLONOSCOPY     HAND SURGERY Left    JOINT REPLACEMENT     TONSILLECTOMY     TONSILLECTOMY AND ADENOIDECTOMY     TOTAL HIP ARTHROPLASTY Left 03/19/2019   Procedure: LEFT TOTAL HIP ARTHROPLASTY ANTERIOR APPROACH;  Surgeon: Mcarthur Rossetti, MD;  [facility];  Service: Orthopedics;  Laterality: Left;   TOTAL HIP ARTHROPLASTY Right 06/04/2019   Procedure: RIGHT TOTAL HIP ARTHROPLASTY ANTERIOR APPROACH;  Surgeon: Mcarthur Rossetti, MD;  [facility];  Service: Orthopedics;  Laterality: Right;   TOTAL KNEE ARTHROPLASTY Left 12/28/2021   Procedure: Left TOTAL KNEE ARTHROPLASTY;  Surgeon: Mcarthur Rossetti, MD;  [facility];  Service: Orthopedics;  Laterality: Left;   Patient Active Problem List   Diagnosis Date Noted   Status post total left knee replacement 12/28/2021   Primary  osteoarthritis of left knee 10/25/2021   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 ([place]) 11/30/2020   Status post total replacement of right hip 06/04/2019   Status post total replacement of left hip 03/19/2019   PTSD (post-traumatic stress disorder)    COPD (chronic obstructive pulmonary disease) (HCC)    Bipolar 1 disorder ([place])    Anxiety    B12 deficiency 12/09/2018   Hyperglycemia 04/14/2017     THERAPY DIAG:  Acute pain of left knee  Stiffness of left knee, not elsewhere classified  Muscle weakness (generalized)  Difficulty in walking, not elsewhere classified  Localized edema  PCP: Caren Macadam, MD   REFERRING PROVIDER: Mcarthur Rossetti, MD   REFERRING DIAG: [phone] (ICD-10-CM) - Status post total left knee replacement   Rationale for Evaluation and Treatment Rehabilitation   ONSET DATE: TKA 12/28/2021   SUBJECTIVE:    SUBJECTIVE STATEMENT: Pt arriving today reporting 1/10. Pt stating her pain increases to 3/10 when she touches her knee.   PERTINENT HISTORY: COPD, PTSD,  Rt THA 8/ 2020. Left THA 03/2019   PAIN:  Are you having pain? Yes: NPRS scale: 1/10 Pain location: left knee medial knee joint Pain description: achy Aggravating factors: walking, standing, bending Relieving factors:  pain meds, ice   PRECAUTIONS: None     OBJECTIVE:      PATIENT SURVEYS:  03/18/2022: FOTO 38% (predicted 53%)   EDEMA:  03/18/2022: Circumferential: Rt knee: 38 centimeters                                             Left knee 40 centimeters   MUSCLE LENGTH: 03/18/2022:  Hamstrings: Right 82 deg; Left 75 deg   PALPATION: 03/18/2022: Tender around incision site and medial joint line of left knee   LOWER EXTREMITY ROM:   Active ROM Right eval Left eval Left 04/02/22 Left 04/16/22  Knee flexion 135 128 128 126  Knee extension -6 -10 -8 -6   (Blank rows = not tested)   PROM:  PROM:  03/18/2022 04/02/22     Left Left   Knee flexion 134 134   Knee extension -6         -5      LOWER EXTREMITY MMT:   MMT Right Eval sitting Left Eval sitting  Hip flexion 28.2 lbs 32.4 lbs  Knee flexion 32.2 lbs 21.1 lbs  Knee extension 34.7 lbs 40.3 lbs   (Blank rows = not tested)      FUNCTIONAL TESTS:  03/18/2022: 5 times sit to stand: 17 seconds UE support 04/02/22: 5 times sit to stand: 16 seconds UE support 04/08/22: 5 times sit to stand: 14.69 sec with UE support   GAIT: 03/18/2022:  Distance walked: 30 feet  Assistive device utilized: Single point cane Level of assistance: Modified independence Comments: amb with antalgic gait        TODAY'S TREATMENT: 04/16/22 TherEx:      Aerobic: NuStep L6 x 8 min, no UE's     Machines: Knee extension 10# single limb x10 each LE Knee flexion 15# single limb x10 each LE, 25# bil LE's x 10 Leg press: bil LE's: 62# 3 x 10 Leg press: Left LE only:  31# 3 x 10    Standing: Step ups on 6 inch step x 15 Lateral step up and over x 15 on 6 inch step Cone taps x 10 alternating each LE, pt requiring hand held assist Functional activities: Stairs: 1 flight, single rail using cane with close supervision  Modalities: Moist heat to left quads x 5 minutes Manual:  Percussion device: x 3 minutes, pt with diffculty tolerating  STM to left quads x 3 minutes   04/10/22 Therex:      Aerobic: NuStep L6 x 8 min     Machines: Knee extension 10# single limb x10 bil, then bil x 10 reps Knee flexion 15# single limb x10 bil; then bil x 10 reps Leg press: bil LE's: 56# 3 x 10 Leg press: Left LE only:  25# 3 x 10    Standing: Forward step ups onto foam x 10 reps bil; 1 UE support Lateral step ups onto foam x 10 reps bil; 1 UE support   04/08/22 Therex:      Aerobic: NuStep L6 x 8 min     Machines: Knee extension 10# single limb x10 bil, then bil x 10 reps Knee flexion 15# single limb x10 bil; then bil x 10 reps Leg press: bil LE's: 56# 3 x 10 Leg press: Left LE only:  25# 3 x 10     Standing: Forward step ups onto 6" step  x 10 reps bil Calf/toe raises x 20 reps    Sitting Lt SLR 2x10 - mod cues for quad control     PATIENT EDUCATION:  Education details: PT POC, HEP Person educated: Patient Education method: Explanation, Demonstration, Tactile cues, Verbal cues, and Handouts Education comprehension: verbalized understanding and returned demonstration     HOME EXERCISE PROGRAM: Access Code: [email or web address] URL: [email or web address] Date: 03/18/2022 Prepared by: Kearney Hard   Exercises - Supine Bridge  - 2 x daily - 7 x weekly - 2 sets - 10 reps - 3-5 hold - Sit to Stand with Counter Support  - 2 x daily - 7 x weekly - 10 reps - Heel Raises with Counter Support  - 1 x daily - 7 x weekly - 2 sets - 10 reps - Seated Long Arc Quad  - 2 x daily - 7 x weekly - 2 sets - 10 reps - 3-5 seconds hold - Standing Hip Abduction with Counter Support  - 2 x daily - 7 x weekly - 2 sets - 10 reps - Standing Hip Extension with Counter Support  - 2 x daily - 7 x weekly - 2 sets - 10 reps - Seated Passive Knee Extension  - 1 x daily - 7 x weekly - 5-10 reps - 5 seconds hold   ASSESSMENT:   CLINICAL IMPRESSION: Treatment focused on strengthening, functional mobility and improving balance. Pt able to navigate 1 flight of stairs with single hand rail and st cane with close supervision. Continue skilled PT toward LTG's set.    OBJECTIVE IMPAIRMENTS Abnormal gait, decreased balance, decreased mobility, difficulty walking, decreased ROM, decreased strength, increased edema, impaired flexibility, and pain.    ACTIVITY LIMITATIONS bending, sitting, standing, squatting, sleeping, and stairs   PARTICIPATION LIMITATIONS: cleaning, driving, and shopping   PERSONAL FACTORS COPD, bipolar disorder, PTSD,  Rt THA 8/ 2020. Left THA 03/2019 are also affecting patient's functional outcome.    REHAB POTENTIAL: Good   CLINICAL DECISION MAKING: Stable/uncomplicated   EVALUATION  COMPLEXITY: Low     GOALS: Goals reviewed with patient? Yes   SHORT TERM GOALS: Target date: 04/15/2022  Patient will demonstrate independent use of initial home exercise program to maintain progress from in clinic treatments. Goal status: MET 04/02/22        2. Pt will be able to perform 5 time sit to stand in </= 15 seconds with no UE support:             A. Goal Status: MET 04/08/22       Long term PT goals (target date: 06/14/2022  ) Patient will demonstrate/report pain at worst less than or equal to 2/10 to facilitate minimal limitation in daily activity secondary to pain symptoms. Goal status: New   Patient will demonstrate independent use of home exercise program to facilitate ability to maintain/progress functional gains from skilled physical therapy services. Goal status: New   Patient will demonstrate FOTO outcome > or = 53 % to indicate reduced disability due to condition. Goal status: New   Pt will be able to navigate 1 flight of stairs with step over step pattern with single hand rail.  Goal status: On-going 04/16/2022       5.  Pt will be able to amb with no device > 500 feet on community surfaces including curb step with no device.   Goal status: On-going 04/16/2022     PLAN: PT FREQUENCY:  2-3x/ week    PT DURATION: 12 weeks  PLANNED INTERVENTIONS: Therapeutic exercises, Therapeutic activity, Neuromuscular re-education, Balance training, Gait training, Patient/Family education, Joint mobilization, Stair training, Dry Needling, Electrical stimulation, Cryotherapy, Moist heat, Taping, Vasopneumatic device, and Manual therapy   PLAN FOR NEXT SESSION: monitor SOB/O2, LE strengthening, Step ups, stepping over objects, Leg Press, work on endurance and standing tolerance; balance (simulated walking in sand if possible)     Kearney Hard, PT, MPT 04/16/22 3:35 PM   04/16/22 3:35 PM

## 2022-04-17 ENCOUNTER — Telehealth: Payer: Self-pay | Admitting: Pharmacist

## 2022-04-17 NOTE — Telephone Encounter (Signed)
I have called the patient with the results of her low dose CT Chest. I explained that her scan was read as a Lung RADS 4 A : suspicious findings, either short term follow up in 3 months or alternatively  PET Scan evaluation may be considered when there is a solid component of  8 mm or larger. She has had the development of a new 6.6 mm nodule in the left upper lobe. She is in agreement with a 3 month folow up mid October 2023.  Angelique Blonder, please fax results to PCP and place order for a 3 month follow up mid October. Thanks so much

## 2022-04-17 NOTE — Chronic Care Management (AMB) (Signed)
Chronic Care Management Pharmacy Assistant   Name: Judy [name]  MRN: [id number] DOB: 11-21-1950  Reason for Encounter: Disease State / COPD assessment call   Conditions to be addressed/monitored: COPD  Recent office visits:  None  Recent consult visits:  04/01/2022 Chilton Greathouse MD (pulmonary) - Patient was seen for Chronic obstructive pulmonary disease, unspecified COPD type. Started Trelegy 200/62.5/25 mcg/act, 1 puff daily. Discontinued Norco and Ipratropium. Follow up in 6 months.   03/21/2022 Sherron Ales PA-C (rheumatology) - Patient was seen for 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 and additional issues. Started Fluconazole 100 mg daily. Follow up in 3 months.   Hospital visits:  None  Medications: Outpatient Encounter Medications as of 04/17/2022  Medication Sig   albuterol (PROVENTIL) (2.5 MG/3ML) 0.083% nebulizer solution Take 3 mLs (2.5 mg total) by nebulization every 6 (six) hours as needed for wheezing or shortness of breath.   Budeson-Glycopyrrol-Formoterol (BREZTRI AEROSPHERE) 160-9-4.8 MCG/ACT AERO Inhale 2 puffs into the lungs in the morning and at bedtime. [id number]; [id number]   Cholecalciferol (VITAMIN D3) 50 MCG (2000 UT) TABS Take 1 tablet by mouth in the morning.   Cyanocobalamin (VITAMIN B-12 PO) Take 2,500 mcg by mouth in the morning.   fluconazole (DIFLUCAN) 100 MG tablet Take 1 tablet (100 mg total) by mouth daily.   Fluticasone-Umeclidin-Vilant (TRELEGY ELLIPTA) 100-62.5-25 MCG/ACT AEPB Inhale 1 puff into the lungs daily.   levalbuterol (XOPENEX HFA) 45 MCG/ACT inhaler Inhale 1 puff into the lungs every 6 (six) hours as needed for wheezing or shortness of breath.   QUEtiapine (SEROQUEL) 400 MG tablet Take 400 mg by mouth at bedtime.   Spacer/Aero-Holding Chambers DEVI 1 Device by Does not apply route daily as needed.   No facility-administered encounter medications on file as of 04/17/2022.  Fill History: ALBUTEROL 0.083% NEB  02/03/2022 30   TRELEGY ELLIPTA 100-62.5-25 04/14/2022 30   LORazepam 1MG        TAB 11/16/2021 90   quetiapine (Seroquel) 400 mg tablet 02/19/2022 90   SPACE CHAMBER ANTI-STA MIS 02/08/2022 1       No data to display         Current COPD regimen:  Proventil 0.083% nebulizer solution Breztri 160/9/4.8 mcg/act 2 puffs  every morning and at bedtime Trelegy Ellipta 100/62.5/25 mcg/act 1 puff daily Xopenex HFA 45 mcg/act 1 puff q 6 hours prn wheezing - on hold pending insurance approval with pulmonology office.  Any recent hospitalizations or ED visits since last visit with CPP? No recent hospital visits.   Reports denies COPD symptoms  What recent interventions/DTPs have been made by any provider to improve breathing since last visit:   04/01/2022 04/03/2022 MD (pulmonary) - Patient was seen for Chronic obstructive pulmonary disease, unspecified COPD type. Started Trelegy 200/62.5/25 mcg/act, 1 puff daily. Discontinued Norco and Ipratropium.        Patient states Trelegy is working very well.   Have you had exacerbation/flare-up since last visit? Patient denies  What do you do when you are short of breath?  Since starting Trelegy patient states she is not having any SOB.  She was using Xopenex HFA and once this is approved she will have it available if needed.   Respiratory Devices/Equipment Do you have a nebulizer? Patient states yes however, she has not needed to use this Do you use a Peak Flow Meter? Yes Do you use a maintenance inhaler? Yes, she is currently using Trelegy and Breztri How often do you forget  to use your daily inhaler? Patient denies forgetting to use inhaler.  Do you use a rescue inhaler? No, pending insurance approval How often do you use your rescue inhaler?  Patient currently does not need to use a rescue inhaler.  Do you use a spacer with your inhaler? No, patient did not like using this.  Adherence Review: Does the patient have >5 day gap between  last estimated fill date for maintenance inhaler medications? No  Patient notified to call and schedule visit with a new PCP.  Care Gaps: AWV - scheduled 02/07/2023 Last BP - 120/62 on 04/01/2022 Covid booster - overdue Shingrix - postponed Pneumonia vaccine - postponed  Star Rating Drugs: None  Inetta Fermo [facility]  Clinical Pharmacist Assistant [phone]

## 2022-04-17 NOTE — Telephone Encounter (Signed)
Order placed for 3 month follow up [facility] CT Chest.

## 2022-04-18 ENCOUNTER — Ambulatory Visit (INDEPENDENT_AMBULATORY_CARE_PROVIDER_SITE_OTHER): Payer: Medicare Other | Admitting: *Deleted

## 2022-04-18 VITALS — BP 148/81 | HR 80

## 2022-04-18 DIAGNOSIS — Z79899 Other long term (current) drug therapy: Secondary | ICD-10-CM

## 2022-04-18 DIAGNOSIS — M45 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: Secondary | ICD-10-CM | POA: Diagnosis not present

## 2022-04-18 DIAGNOSIS — Z111 Encounter for screening for respiratory tuberculosis: Secondary | ICD-10-CM

## 2022-04-18 MED ORDER — CERTOLIZUMAB PEGOL 2 X 200 MG ~~LOC~~ KIT
400.0000 mg | PACK | Freq: Once | SUBCUTANEOUS | Status: AC
  Administered 2022-04-18: 400 mg via SUBCUTANEOUS

## 2022-04-18 NOTE — Progress Notes (Signed)
Pharmacy Note  Subjective:   Patient presents to clinic today to receive monthly dose of Cimzia.  Patient running a fever or have signs/symptoms of infection? No  Patient currently on antibiotics for the treatment of infection? No  Patient have any upcoming invasive procedures/surgeries? No  Objective: CMP     Component Value Date/Time   NA 142 02/01/2022 1625   K 4.1 02/01/2022 1625   CL 105 02/01/2022 1625   CO2 23 02/01/2022 1625   GLUCOSE 116 (H) 02/01/2022 1625   BUN 35 (H) 02/01/2022 1625   CREATININE 1.06 (H) 02/01/2022 1625   CALCIUM 9.8 02/01/2022 1625   PROT 7.1 02/01/2022 1625   ALBUMIN 4.2 10/30/2020 1227   AST 30 02/01/2022 1625   ALT 29 02/01/2022 1625   ALKPHOS 144 (H) 10/30/2020 1227   BILITOT 0.4 02/01/2022 1625   GFRNONAA >60 12/29/2021 0320   GFRNONAA 47 (L) 04/11/2021 1419   GFRAA 54 (L) 04/11/2021 1419    CBC    Component Value Date/Time   WBC 10.4 02/01/2022 1625   RBC 3.99 02/01/2022 1625   HGB 11.0 (L) 02/01/2022 1625   HCT 34.2 (L) 02/01/2022 1625   PLT 431 (H) 02/01/2022 1625   MCV 85.7 02/01/2022 1625   MCH 27.6 02/01/2022 1625   MCHC 32.2 02/01/2022 1625   RDW 13.6 02/01/2022 1625   LYMPHSABS 1,976 02/01/2022 1625   MONOABS 0.4 10/30/2020 1227   EOSABS 42 02/01/2022 1625   BASOSABS 52 02/01/2022 1625    Baseline Immunosuppressant Therapy Labs TB GOLD    Latest Ref Rng & Units 04/11/2021    2:19 PM  Quantiferon TB Gold  Quantiferon TB Gold Plus NEGATIVE NEGATIVE    Hepatitis Panel    Latest Ref Rng & Units 04/26/2020   11:26 AM  Hepatitis  Hep B Surface Ag NON-REACTI NON-REACTIVE   Hep B IgM NON-REACTI NON-REACTIVE    HIV Lab Results  Component Value Date   HIV NON-REACTIVE 04/26/2020   HIV Non Reactive 08/23/2017   HIV Non Reactive 04/14/2017   Immunoglobulins    Latest Ref Rng & Units 04/26/2020   11:26 AM  Immunoglobulin Electrophoresis  IgA  70 - 320 mg/dL 120   IgG 600 - 1,540 mg/dL 824   IgM 50 - 300 mg/dL  47    SPEP    Latest Ref Rng & Units 02/01/2022    4:25 PM  Serum Protein Electrophoresis  Total Protein 6.1 - 8.1 g/dL 7.1    G6PD No results found for: "G6PDH" TPMT No results found for: "TPMT"   Chest x-ray: 02/01/2022 Chronic appearing increased interstitial lung markings without evidence of acute or active cardiopulmonary disease.  Assessment/Plan:  Administrations This Visit     certolizumab pegol (CIMZIA) kit 400 mg     Admin Date 04/18/2022 Action Given Dose 400 mg Route Subcutaneous Administered By Carole Binning, LPN             Patient tolerated injection well.   Appointment for next injection scheduled for May 16, 2022.  Patient due for labs in July 2023. Our lab tech is not in the office and patient was given orders to take to the [facility] to have them done.  Patient is to call and reschedule appointment if running a fever with signs/symptoms of infection, on antibiotics for active infection or has an upcoming invasive procedure.  All questions encouraged and answered.  Instructed patient to call with any further questions or concerns.

## 2022-04-19 NOTE — Telephone Encounter (Signed)
Patient called in to review results of LDCT.  Reviewed the previous note from [facility], NP (below).  Patient acknowledged understanding and agrees to repeat scan in October 2023.  We will call her to schedule closer to the date.  Patient had no further questions.

## 2022-04-19 NOTE — Progress Notes (Signed)
CBC and CMP WNL

## 2022-04-21 LAB — CBC WITH DIFFERENTIAL/PLATELET
Absolute Monocytes: 514 cells/uL (ref 200–950)
Basophils Absolute: 21 cells/uL (ref 0–200)
Basophils Relative: 0.4 %
Eosinophils Absolute: 111 cells/uL (ref 15–500)
Eosinophils Relative: 2.1 %
HCT: 37.3 % (ref 35.0–45.0)
Hemoglobin: 11.8 g/dL (ref 11.7–15.5)
Lymphs Abs: 1818 cells/uL (ref 850–3900)
MCH: 27.1 pg (ref 27.0–33.0)
MCHC: 31.6 g/dL — ABNORMAL LOW (ref 32.0–36.0)
MCV: 85.6 fL (ref 80.0–100.0)
MPV: 9.8 fL (ref 7.5–12.5)
Monocytes Relative: 9.7 %
Neutro Abs: 2836 cells/uL (ref 1500–7800)
Neutrophils Relative %: 53.5 %
Platelets: 269 10*3/uL (ref 140–400)
RBC: 4.36 10*6/uL (ref 3.80–5.10)
RDW: 13.9 % (ref 11.0–15.0)
Total Lymphocyte: 34.3 %
WBC: 5.3 10*3/uL (ref 3.8–10.8)

## 2022-04-21 LAB — QUANTIFERON-TB GOLD PLUS
Mitogen-NIL: 7.81 IU/mL
NIL: 0.01 IU/mL
QuantiFERON-TB Gold Plus: NEGATIVE
TB1-NIL: 0 IU/mL
TB2-NIL: 0.01 IU/mL

## 2022-04-21 LAB — COMPLETE METABOLIC PANEL WITH GFR
AG Ratio: 1.4 (calc) (ref 1.0–2.5)
ALT: 19 U/L (ref 6–29)
AST: 15 U/L (ref 10–35)
Albumin: 3.9 g/dL (ref 3.6–5.1)
Alkaline phosphatase (APISO): 152 U/L (ref 37–153)
BUN: 12 mg/dL (ref 7–25)
CO2: 24 mmol/L (ref 20–32)
Calcium: 9.8 mg/dL (ref 8.6–10.4)
Chloride: 106 mmol/L (ref 98–110)
Creat: 0.94 mg/dL (ref 0.60–1.00)
Globulin: 2.7 g/dL (calc) (ref 1.9–3.7)
Glucose, Bld: 104 mg/dL — ABNORMAL HIGH (ref 65–99)
Potassium: 4.6 mmol/L (ref 3.5–5.3)
Sodium: 140 mmol/L (ref 135–146)
Total Bilirubin: 0.3 mg/dL (ref 0.2–1.2)
Total Protein: 6.6 g/dL (ref 6.1–8.1)
eGFR: 65 mL/min/{1.73_m2} (ref >=60)

## 2022-04-22 NOTE — Progress Notes (Signed)
TB gold negative

## 2022-04-23 ENCOUNTER — Encounter: Payer: Self-pay | Admitting: Physical Therapy

## 2022-04-23 ENCOUNTER — Ambulatory Visit (INDEPENDENT_AMBULATORY_CARE_PROVIDER_SITE_OTHER): Payer: Medicare Other | Admitting: Physical Therapy

## 2022-04-23 DIAGNOSIS — M6281 Muscle weakness (generalized): Secondary | ICD-10-CM

## 2022-04-23 DIAGNOSIS — R262 Difficulty in walking, not elsewhere classified: Secondary | ICD-10-CM

## 2022-04-23 DIAGNOSIS — M25662 Stiffness of left knee, not elsewhere classified: Secondary | ICD-10-CM | POA: Diagnosis not present

## 2022-04-23 DIAGNOSIS — M25562 Pain in left knee: Secondary | ICD-10-CM

## 2022-04-23 DIAGNOSIS — R600 Localized edema: Secondary | ICD-10-CM

## 2022-04-23 NOTE — Therapy (Signed)
OUTPATIENT PHYSICAL THERAPY TREATMENT NOTE   Patient Name: Judy [name] MRN: [id number] DOB:Jul 19, 1951, 71 y.o., female Today's Date: 04/23/2022   END OF SESSION:   PT End of Session - 04/23/22 1401     Visit Number 8    Number of Visits 24    Date for PT Re-Evaluation 06/14/22    Authorization Type KX modifier after 15 visits    Progress Note Due on Visit 10    PT Start Time 7340    PT Stop Time 1430    PT Time Calculation (min) 45 min    Activity Tolerance Patient limited by fatigue    Behavior During Therapy [facility] for tasks assessed/performed                   Past Medical History:  Diagnosis Date   Anxiety    Arthritis    Avascular necrosis ([place])    Left hip   Avascular necrosis of bone of hip, 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   Bipolar 1 disorder ([place])    COPD (chronic obstructive pulmonary disease) ([place])    Depression    Dyspnea    Pneumonia    PTSD (post-traumatic stress disorder)    Past Surgical History:  Procedure Laterality Date   bone spurs foot Right    CATARACT EXTRACTION Bilateral    COLONOSCOPY     HAND SURGERY Left    JOINT REPLACEMENT     TONSILLECTOMY     TONSILLECTOMY AND ADENOIDECTOMY     TOTAL HIP ARTHROPLASTY Left 03/19/2019   Procedure: LEFT TOTAL HIP ARTHROPLASTY ANTERIOR APPROACH;  Surgeon: Mcarthur Rossetti, MD;  Location: [facility];  Service: Orthopedics;  Laterality: Left;   TOTAL HIP ARTHROPLASTY Right 06/04/2019   Procedure: RIGHT TOTAL HIP ARTHROPLASTY ANTERIOR APPROACH;  Surgeon: Mcarthur Rossetti, MD;  Location: [facility];  Service: Orthopedics;  Laterality: Right;   TOTAL KNEE ARTHROPLASTY Left 12/28/2021   Procedure: Left TOTAL KNEE ARTHROPLASTY;  Surgeon: Mcarthur Rossetti, MD;  Location: [facility];  Service: Orthopedics;  Laterality: Left;   Patient Active Problem List   Diagnosis Date Noted   Status post total left knee replacement 12/28/2021    Primary osteoarthritis of left knee 10/25/2021   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 ([place]) 11/30/2020   Status post total replacement of right hip 06/04/2019   Status post total replacement of left hip 03/19/2019   PTSD (post-traumatic stress disorder)    COPD (chronic obstructive pulmonary disease) (H[facility])    Bipolar 1 disorder ([place])    Anxiety    B12 deficiency 12/09/2018   Hyperglycemia 04/14/2017     THERAPY DIAG:  Acute pain of left knee  Stiffness of left knee, not elsewhere classified  Muscle weakness (generalized)  Difficulty in walking, not elsewhere classified  Localized edema  PCP: Caren Macadam, MD   REFERRING PROVIDER: Mcarthur Rossetti, MD   REFERRING DIAG: [phone] (ICD-10-CM) - Status post total left knee replacement   Rationale for Evaluation and Treatment Rehabilitation   ONSET DATE: TKA 12/28/2021   SUBJECTIVE:    SUBJECTIVE STATEMENT: Pt stating pain in Left knee was 3/10. Pt stating she has been walking around her home with no device. Pt also stating she walked up the stairs to get to clinic gym.   PERTINENT HISTORY: COPD, PTSD,  Rt THA 8/ 2020. Left THA 03/2019   PAIN:  Are you having pain? Yes: NPRS scale: 3/10  Pain location: left knee medial knee joint Pain description: achy Aggravating factors: walking, standing, bending Relieving factors: pain meds, ice   PRECAUTIONS: None     OBJECTIVE:      PATIENT SURVEYS:  03/18/2022: FOTO 38% (predicted 53%)   EDEMA:  03/18/2022: Circumferential: Rt knee: 38 centimeters                                             Left knee 40 centimeters   MUSCLE LENGTH: 03/18/2022:  Hamstrings: Right 82 deg; Left 75 deg   PALPATION: 03/18/2022: Tender around incision site and medial joint line of left knee   LOWER EXTREMITY ROM:   Active ROM Right eval Left eval Left 04/02/22 Left 04/16/22 Left 04/23/22  Knee flexion 135 128 128 126 130  Knee extension -6 -10 -8 -6 -5   (Blank  rows = not tested)   PROM:  PROM:  03/18/2022 04/02/22     Left Left   Knee flexion 134 134  Knee extension -6         -5      LOWER EXTREMITY MMT:   MMT Right Eval sitting Left Eval sitting  Hip flexion 28.2 lbs 32.4 lbs  Knee flexion 32.2 lbs 21.1 lbs  Knee extension 34.7 lbs 40.3 lbs   (Blank rows = not tested)      FUNCTIONAL TESTS:  03/18/2022: 5 times sit to stand: 17 seconds UE support 04/02/22: 5 times sit to stand: 16 seconds UE support 04/08/22: 5 times sit to stand: 14.69 sec with UE support   GAIT: 03/18/2022:  Distance walked: 30 feet  Assistive device utilized: Single point cane Level of assistance: Modified independence Comments: amb with antalgic gait        TODAY'S TREATMENT: 04/23/22 TherEx:      Aerobic: Bike: Level 5 x 5 minutes     Machines: Knee extension 15# double leg out and Left LE lower Knee flexion 15# single limb x10 each LE, 25# bil LE's x 10 Leg press: bil LE's: 10 # 3 x 10 Leg press: Left LE only:  50# 2 x 10    Standing: Calf raises x 2 holding 30 seconds Functional activities: Stairs: 1 flight, single rail using cane with close supervision Walking on Airex beam x 6 c UE support Side stepping on Airex beam x 6 c UE support Standing on Airex marching c single UE support Standing on Airex: feet shoulder width apart, no UE support, looking up, down, and side to side c close supervision     04/16/22 TherEx:      Aerobic: NuStep L6 x 8 min, no UE's     Machines: Knee extension 10# single limb x10 each LE Knee flexion 15# single limb x10 each LE, 25# bil LE's x 10 Leg press: bil LE's: 62# 3 x 10 Leg press: Left LE only:  31# 3 x 10    Standing: Step ups on 6 inch step x 15 Lateral step up and over x 15 on 6 inch step Cone taps x 10 alternating each LE, pt requiring hand held assist Functional activities: Stairs: 1 flight, single rail using cane with close supervision  Modalities: Moist heat to left quads x 5 minutes Manual:   Percussion device: x 3 minutes, pt with diffculty tolerating  STM to left quads x 3 minutes   04/10/22 Therex:  Aerobic: NuStep L6 x 8 min     Machines: Knee extension 10# single limb x10 bil, then bil x 10 reps Knee flexion 15# single limb x10 bil; then bil x 10 reps Leg press: bil LE's: 56# 3 x 10 Leg press: Left LE only:  25# 3 x 10    Standing: Forward step ups onto foam x 10 reps bil; 1 UE support Lateral step ups onto foam x 10 reps bil; 1 UE support        PATIENT EDUCATION:  Education details: PT POC, HEP Person educated: Patient Education method: Explanation, Demonstration, Tactile cues, Verbal cues, and Handouts Education comprehension: verbalized understanding and returned demonstration     HOME EXERCISE PROGRAM: Access Code: [email or web address] URL: [email or web address] Date: 03/18/2022 Prepared by: Kearney Hard   Exercises - Supine Bridge  - 2 x daily - 7 x weekly - 2 sets - 10 reps - 3-5 hold - Sit to Stand with Counter Support  - 2 x daily - 7 x weekly - 10 reps - Heel Raises with Counter Support  - 1 x daily - 7 x weekly - 2 sets - 10 reps - Seated Long Arc Quad  - 2 x daily - 7 x weekly - 2 sets - 10 reps - 3-5 seconds hold - Standing Hip Abduction with Counter Support  - 2 x daily - 7 x weekly - 2 sets - 10 reps - Standing Hip Extension with Counter Support  - 2 x daily - 7 x weekly - 2 sets - 10 reps - Seated Passive Knee Extension  - 1 x daily - 7 x weekly - 5-10 reps - 5 seconds hold   ASSESSMENT:   CLINICAL IMPRESSION: Treatment focused on strengthening, functional mobility and improving balance.We worked more Airex mat and beam to help mimic unlevel surfaces to prepare pt for going to the beach in a few weeks. Continue skilled PT toward LTG's set.    OBJECTIVE IMPAIRMENTS Abnormal gait, decreased balance, decreased mobility, difficulty walking, decreased ROM, decreased strength, increased edema, impaired flexibility, and pain.     ACTIVITY LIMITATIONS bending, sitting, standing, squatting, sleeping, and stairs   PARTICIPATION LIMITATIONS: cleaning, driving, and shopping   PERSONAL FACTORS COPD, bipolar disorder, PTSD,  Rt THA 8/ 2020. Left THA 03/2019 are also affecting patient's functional outcome.    REHAB POTENTIAL: Good   CLINICAL DECISION MAKING: Stable/uncomplicated   EVALUATION COMPLEXITY: Low     GOALS: Goals reviewed with patient? Yes   SHORT TERM GOALS: Target date: 04/15/2022  Patient will demonstrate independent use of initial home exercise program to maintain progress from in clinic treatments. Goal status: MET 04/02/22        2. Pt will be able to perform 5 time sit to stand in </= 15 seconds with no UE support:             A. Goal Status: MET 04/08/22       Long term PT goals (target date: 06/14/2022  ) Patient will demonstrate/report pain at worst less than or equal to 2/10 to facilitate minimal limitation in daily activity secondary to pain symptoms. Goal status: New   Patient will demonstrate independent use of home exercise program to facilitate ability to maintain/progress functional gains from skilled physical therapy services. Goal status: New   Patient will demonstrate FOTO outcome > or = 53 % to indicate reduced disability due to condition. Goal status: New   Pt will be able to navigate 1 flight of stairs  with step over step pattern with single hand rail.  Goal status: On-going 04/16/2022       5.  Pt will be able to amb with no device > 500 feet on community surfaces including curb step with no device.   Goal status: On-going 04/16/2022     PLAN: PT FREQUENCY:  2-3x/ week    PT DURATION: 12 weeks   PLANNED INTERVENTIONS: Therapeutic exercises, Therapeutic activity, Neuromuscular re-education, Balance training, Gait training, Patient/Family education, Joint mobilization, Stair training, Dry Needling, Electrical stimulation, Cryotherapy, Moist heat, Taping, Vasopneumatic device,  and Manual therapy   PLAN FOR NEXT SESSION: monitor SOB/O2, LE strengthening, balance (simulated walking in sand if possible), take outside and walk in grass and on inclined surface if air quality improves.      Kearney Hard, PT, MPT 04/23/22 2:32 PM   04/23/22 2:32 PM

## 2022-04-24 ENCOUNTER — Ambulatory Visit (INDEPENDENT_AMBULATORY_CARE_PROVIDER_SITE_OTHER): Payer: Medicare Other | Admitting: Physical Therapy

## 2022-04-24 ENCOUNTER — Telehealth: Payer: Self-pay | Admitting: Rheumatology

## 2022-04-24 ENCOUNTER — Encounter: Payer: Self-pay | Admitting: Physical Therapy

## 2022-04-24 DIAGNOSIS — M25562 Pain in left knee: Secondary | ICD-10-CM

## 2022-04-24 DIAGNOSIS — M25662 Stiffness of left knee, not elsewhere classified: Secondary | ICD-10-CM

## 2022-04-24 DIAGNOSIS — M6281 Muscle weakness (generalized): Secondary | ICD-10-CM

## 2022-04-24 DIAGNOSIS — R600 Localized edema: Secondary | ICD-10-CM

## 2022-04-24 DIAGNOSIS — R262 Difficulty in walking, not elsewhere classified: Secondary | ICD-10-CM | POA: Diagnosis not present

## 2022-04-24 NOTE — Therapy (Signed)
OUTPATIENT PHYSICAL THERAPY TREATMENT NOTE   Patient Name: XOEY WARMOTH MRN: [id number] DOB:06/01/1951, 71 y.o., female Today's Date: 04/24/2022   END OF SESSION:   PT End of Session - 04/24/22 1432     Visit Number 9    Number of Visits 24    Date for PT Re-Evaluation 06/14/22    Authorization Type KX modifier after 15 visits    Progress Note Due on Visit 10    PT Start Time 1429    PT Stop Time 1507    PT Time Calculation (min) 38 min    Activity Tolerance Patient limited by fatigue    Behavior During Therapy [facility] for tasks assessed/performed                    Past Medical History:  Diagnosis Date   Anxiety    Arthritis    Avascular necrosis ([place])    Left hip   Avascular necrosis of bone of hip, left ([place]) 12/28/2018   Avascular necrosis of bone of right hip ([place]) 04/28/2019   Bipolar 1 disorder ([place])    COPD (chronic obstructive pulmonary disease) ([place])    Depression    Dyspnea    Pneumonia    PTSD (post-traumatic stress disorder)    Past Surgical History:  Procedure Laterality Date   bone spurs foot Right    CATARACT EXTRACTION Bilateral    COLONOSCOPY     HAND SURGERY Left    JOINT REPLACEMENT     TONSILLECTOMY     TONSILLECTOMY AND ADENOIDECTOMY     TOTAL HIP ARTHROPLASTY Left 03/19/2019   Procedure: LEFT TOTAL HIP ARTHROPLASTY ANTERIOR APPROACH;  Surgeon: Mcarthur Rossetti, MD;  Location: [facility];  Service: Orthopedics;  Laterality: Left;   TOTAL HIP ARTHROPLASTY Right 06/04/2019   Procedure: RIGHT TOTAL HIP ARTHROPLASTY ANTERIOR APPROACH;  Surgeon: Mcarthur Rossetti, MD;  Location: [facility];  Service: Orthopedics;  Laterality: Right;   TOTAL KNEE ARTHROPLASTY Left 12/28/2021   Procedure: Left TOTAL KNEE ARTHROPLASTY;  Surgeon: Mcarthur Rossetti, MD;  Location: [facility];  Service: Orthopedics;  Laterality: Left;   Patient Active Problem List   Diagnosis Date Noted   Status post total left knee replacement 12/28/2021    Primary osteoarthritis of left knee 10/25/2021   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 ([place]) 11/30/2020   Status post total replacement of right hip 06/04/2019   Status post total replacement of left hip 03/19/2019   PTSD (post-traumatic stress disorder)    COPD (chronic obstructive pulmonary disease) ([facility])    Bipolar 1 disorder ([place])    Anxiety    B12 deficiency 12/09/2018   Hyperglycemia 04/14/2017     THERAPY DIAG:  Acute pain of left knee  Stiffness of left knee, not elsewhere classified  Muscle weakness (generalized)  Difficulty in walking, not elsewhere classified  Localized edema  PCP: Caren Macadam, MD   REFERRING PROVIDER: Mcarthur Rossetti, MD   REFERRING DIAG: [phone] (ICD-10-CM) - Status post total left knee replacement   Rationale for Evaluation and Treatment Rehabilitation   ONSET DATE: TKA 12/28/2021   SUBJECTIVE:    SUBJECTIVE STATEMENT: Doing well, took stairs yesterday but doesn't want to do that again today.   PERTINENT HISTORY: COPD, PTSD,  Rt THA 8/ 2020. Left THA 03/2019   PAIN:  Are you having pain? Yes: NPRS scale: 2/10 Pain location: left knee medial knee joint Pain description: achy Aggravating factors: walking, standing, bending Relieving factors: pain meds,  ice   PRECAUTIONS: None     OBJECTIVE:      PATIENT SURVEYS:  03/18/2022: FOTO 38% (predicted 53%) 04/24/22: FOTO 47   EDEMA:  03/18/2022: Circumferential: Rt knee: 38 centimeters                                             Left knee 40 centimeters   MUSCLE LENGTH: 03/18/2022:  Hamstrings: Right 82 deg; Left 75 deg   PALPATION: 03/18/2022: Tender around incision site and medial joint line of left knee   LOWER EXTREMITY ROM:   Active ROM Right eval Left eval Left 04/02/22 Left 04/16/22 Left 04/23/22  Knee flexion 135 128 128 126 130  Knee extension -6 -10 -8 -6 -5   (Blank rows = not tested)   PROM:  PROM:  03/18/2022 04/02/22     Left Left    Knee flexion 134 134  Knee extension -6         -5      LOWER EXTREMITY MMT:   MMT Right Eval sitting Left Eval sitting  Hip flexion 28.2 lbs 32.4 lbs  Knee flexion 32.2 lbs 21.1 lbs  Knee extension 34.7 lbs 40.3 lbs   (Blank rows = not tested)      FUNCTIONAL TESTS:  03/18/2022: 5 times sit to stand: 17 seconds UE support 04/02/22: 5 times sit to stand: 16 seconds UE support 04/08/22: 5 times sit to stand: 14.69 sec with UE support   GAIT: 03/18/2022:  Distance walked: 30 feet  Assistive device utilized: Single point cane Level of assistance: Modified independence Comments: amb with antalgic gait        TODAY'S TREATMENT: 04/24/22 TherEx:      Aerobic: NuStep: Level 6 x 8 minutes     Machines: Leg press: bil LE's: 100 # 3 x 10 Leg press: Left LE only:  50# 2 x 10    Standing: Calf stretch on slant board 3x30 sec Heel/toe raises x 20 reps    Sitting: SLR 2 x 10 reps on Lt Sit to/from stand x 10 reps Hip adduction ball squeeze 20 x 5 sec hold  04/23/22 TherEx:      Aerobic: Bike: Level 5 x 5 minutes     Machines: Knee extension 15# double leg out and Left LE lower Knee flexion 15# single limb x10 each LE, 25# bil LE's x 10 Leg press: bil LE's: 10 # 3 x 10 Leg press: Left LE only:  50# 2 x 10    Standing: Calf raises x 2 holding 30 seconds Functional activities: Stairs: 1 flight, single rail using cane with close supervision Walking on Airex beam x 6 c UE support Side stepping on Airex beam x 6 c UE support Standing on Airex marching c single UE support Standing on Airex: feet shoulder width apart, no UE support, looking up, down, and side to side c close supervision     04/16/22 TherEx:      Aerobic: NuStep L6 x 8 min, no UE's     Machines: Knee extension 10# single limb x10 each LE Knee flexion 15# single limb x10 each LE, 25# bil LE's x 10 Leg press: bil LE's: 62# 3 x 10 Leg press: Left LE only:  31# 3 x 10    Standing: Step ups on 6 inch step x  15 Lateral step up and over  x 15 on 6 inch step Cone taps x 10 alternating each LE, pt requiring hand held assist Functional activities: Stairs: 1 flight, single rail using cane with close supervision  Modalities: Moist heat to left quads x 5 minutes Manual:  Percussion device: x 3 minutes, pt with diffculty tolerating  STM to left quads x 3 minutes    PATIENT EDUCATION:  Education details: PT POC, HEP Person educated: Patient Education method: Consulting civil engineer, Demonstration, Tactile cues, Verbal cues, and Handouts Education comprehension: verbalized understanding and returned demonstration     HOME EXERCISE PROGRAM: Access Code: [email or web address] URL: [email or web address] Date: 03/18/2022 Prepared by: Kearney Hard   Exercises - Supine Bridge  - 2 x daily - 7 x weekly - 2 sets - 10 reps - 3-5 hold - Sit to Stand with Counter Support  - 2 x daily - 7 x weekly - 10 reps - Heel Raises with Counter Support  - 1 x daily - 7 x weekly - 2 sets - 10 reps - Seated Long Arc Quad  - 2 x daily - 7 x weekly - 2 sets - 10 reps - 3-5 seconds hold - Standing Hip Abduction with Counter Support  - 2 x daily - 7 x weekly - 2 sets - 10 reps - Standing Hip Extension with Counter Support  - 2 x daily - 7 x weekly - 2 sets - 10 reps - Seated Passive Knee Extension  - 1 x daily - 7 x weekly - 5-10 reps - 5 seconds hold   ASSESSMENT:   CLINICAL IMPRESSION: Pt with increased fatigue and soreness from PT two days in a row, needing rest breaks.  Overall doing well with PT with increase in FOTO score noted.  Likely ready for hold or d/c next visit.    OBJECTIVE IMPAIRMENTS Abnormal gait, decreased balance, decreased mobility, difficulty walking, decreased ROM, decreased strength, increased edema, impaired flexibility, and pain.    ACTIVITY LIMITATIONS bending, sitting, standing, squatting, sleeping, and stairs   PARTICIPATION LIMITATIONS: cleaning, driving, and shopping   PERSONAL FACTORS COPD,  bipolar disorder, PTSD,  Rt THA 8/ 2020. Left THA 03/2019 are also affecting patient's functional outcome.    REHAB POTENTIAL: Good   CLINICAL DECISION MAKING: Stable/uncomplicated   EVALUATION COMPLEXITY: Low     GOALS: Goals reviewed with patient? Yes   SHORT TERM GOALS: Target date: 04/15/2022  Patient will demonstrate independent use of initial home exercise program to maintain progress from in clinic treatments. Goal status: MET 04/02/22        2. Pt will be able to perform 5 time sit to stand in </= 15 seconds with no UE support:             A. Goal Status: MET 04/08/22       Long term PT goals (target date: 06/14/2022) Patient will demonstrate/report pain at worst less than or equal to 2/10 to facilitate minimal limitation in daily activity secondary to pain symptoms. Goal status: New   Patient will demonstrate independent use of home exercise program to facilitate ability to maintain/progress functional gains from skilled physical therapy services. Goal status: New   Patient will demonstrate FOTO outcome > or = 53 % to indicate reduced disability due to condition. Goal status: New   Pt will be able to navigate 1 flight of stairs with step over step pattern with single hand rail.  Goal status: On-going 04/16/2022       5.  Pt will be able to amb with  no device > 500 feet on community surfaces including curb step with no device.   Goal status: On-going 04/16/2022     PLAN: PT FREQUENCY:  2-3x/ week    PT DURATION: 12 weeks   PLANNED INTERVENTIONS: Therapeutic exercises, Therapeutic activity, Neuromuscular re-education, Balance training, Gait training, Patient/Family education, Joint mobilization, Stair training, Dry Needling, Electrical stimulation, Cryotherapy, Moist heat, Taping, Vasopneumatic device, and Manual therapy   PLAN FOR NEXT SESSION: needs 10th visit progress note, monitor SOB/O2, LE strengthening, balance (simulated walking in sand if possible), take outside  and walk in grass and on inclined surface if air quality improves.      Laureen Abrahams, PT, DPT 04/24/22 3:09 PM

## 2022-04-24 NOTE — Telephone Encounter (Signed)
-----   Message from Henriette Combs, LPN sent at 04/04/4764  9:22 AM EDT ----- Follow-Up Instructions: Return in about 3 months (around 06/21/2022) for Ankylosing Spondylitis, DDD.  Please contact patient and schedule a follow up visit. Thanks!

## 2022-04-24 NOTE — Telephone Encounter (Signed)
LMOM for patient to call and schedule follow-up appointment.   °

## 2022-04-29 ENCOUNTER — Other Ambulatory Visit: Payer: Self-pay | Admitting: Orthopaedic Surgery

## 2022-04-29 ENCOUNTER — Ambulatory Visit (INDEPENDENT_AMBULATORY_CARE_PROVIDER_SITE_OTHER): Payer: Medicare Other | Admitting: Physical Therapy

## 2022-04-29 ENCOUNTER — Encounter: Payer: Self-pay | Admitting: Physical Therapy

## 2022-04-29 ENCOUNTER — Telehealth: Payer: Self-pay

## 2022-04-29 DIAGNOSIS — M25662 Stiffness of left knee, not elsewhere classified: Secondary | ICD-10-CM | POA: Diagnosis not present

## 2022-04-29 DIAGNOSIS — M25562 Pain in left knee: Secondary | ICD-10-CM

## 2022-04-29 DIAGNOSIS — M6281 Muscle weakness (generalized): Secondary | ICD-10-CM | POA: Diagnosis not present

## 2022-04-29 DIAGNOSIS — R600 Localized edema: Secondary | ICD-10-CM

## 2022-04-29 DIAGNOSIS — R262 Difficulty in walking, not elsewhere classified: Secondary | ICD-10-CM

## 2022-04-29 MED ORDER — HYDROCODONE-ACETAMINOPHEN 7.5-325 MG PO TABS: 1.0000 | ORAL_TABLET | Freq: Every evening | ORAL | 0 refills | Status: DC | PRN

## 2022-04-29 NOTE — Telephone Encounter (Signed)
Patient states she is ok during the day, but has pain in the knee that wakes her up at night. Wondering if she can have something for her pain at night time so maybe she can sleep an entire night without waking up with pain

## 2022-04-29 NOTE — Therapy (Signed)
OUTPATIENT PHYSICAL THERAPY TREATMENT NOTE  Progress Note Reporting Period 03/18/22 to 04/29/22   See note below for Objective Data and Assessment of Progress/Goals.          Patient Name: Judy [name] MRN: [id number] DOB:[birth year], 71 y.o., female Today's Date: 04/29/2022   END OF SESSION:   PT End of Session - 04/29/22 1304     Visit Number 10    Number of Visits 24    Date for PT Re-Evaluation 06/14/22    Authorization Type KX modifier after 15 visits    Progress Note Due on Visit 10    PT Start Time 1300    PT Stop Time 1341    PT Time Calculation (min) 41 min    Activity Tolerance Patient limited by fatigue    Behavior During Therapy [facility] for tasks assessed/performed                     Past Medical History:  Diagnosis Date   Anxiety    Arthritis    Avascular necrosis ([place])    Left hip   Avascular necrosis of bone of hip, left ([place]) 12/28/2018   Avascular necrosis of bone of right hip ([place]) 04/28/2019   Bipolar 1 disorder ([facility])    COPD (chronic obstructive pulmonary disease) ([place])    Depression    Dyspnea    Pneumonia    PTSD (post-traumatic stress disorder)    Past Surgical History:  Procedure Laterality Date   bone spurs foot Right    CATARACT EXTRACTION Bilateral    COLONOSCOPY     HAND SURGERY Left    JOINT REPLACEMENT     TONSILLECTOMY     TONSILLECTOMY AND ADENOIDECTOMY     TOTAL HIP ARTHROPLASTY Left 03/19/2019   Procedure: LEFT TOTAL HIP ARTHROPLASTY ANTERIOR APPROACH;  Surgeon: Mcarthur Rossetti, MD;  Location[facility];  Service: Orthopedics;  Laterality: Left;   TOTAL HIP ARTHROPLASTY Right 06/04/2019   Procedure: RIGHT TOTAL HIP ARTHROPLASTY ANTERIOR APPROACH;  Surgeon: Mcarthur Rossetti, MD;  [facility];  Service: Orthopedics;  Laterality: Right;   TOTAL KNEE ARTHROPLASTY Left 12/28/2021   Procedure: Left TOTAL KNEE ARTHROPLASTY;  Surgeon: Mcarthur Rossetti, MD;  [facility];  Service:  Orthopedics;  Laterality: Left;   Patient Active Problem List   Diagnosis Date Noted   Status post total left knee replacement 12/28/2021   Primary osteoarthritis of left knee 10/25/2021   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 ([place]) 11/30/2020   Status post total replacement of right hip 06/04/2019   Status post total replacement of left hip 03/19/2019   PTSD (post-traumatic stress disorder)    COPD (chronic obstructive pulmonary disease) (HCC)    Bipolar 1 disorder ([place])    Anxiety    B12 deficiency 12/09/2018   Hyperglycemia 04/14/2017     THERAPY DIAG:  Acute pain of left knee  Stiffness of left knee, not elsewhere classified  Muscle weakness (generalized)  Difficulty in walking, not elsewhere classified  Localized edema  PCP: Caren Macadam, MD   REFERRING PROVIDER: Mcarthur Rossetti, MD   REFERRING DIAG: [phone] (ICD-10-CM) - Status post total left knee replacement   Rationale for Evaluation and Treatment Rehabilitation   ONSET DATE: TKA 12/28/2021   SUBJECTIVE:    SUBJECTIVE STATEMENT: No complaints today, still a little SOB   PERTINENT HISTORY: COPD, PTSD,  Rt THA 8/ 2020. Left THA 03/2019   PAIN:  Are you having pain? Yes:  NPRS scale: 2/10 Pain location: left knee medial knee joint Pain description: achy Aggravating factors: walking, standing, bending Relieving factors: pain meds, ice   PRECAUTIONS: None     OBJECTIVE:      PATIENT SURVEYS:  03/18/2022: FOTO 38% (predicted 53%) 04/24/22: FOTO 47 04/29/22: FOTO 44    EDEMA:  03/18/2022: Circumferential: Rt knee: 38 centimeters                                             Left knee 40 centimeters   MUSCLE LENGTH: 03/18/2022:  Hamstrings: Right 82 deg; Left 75 deg   PALPATION: 03/18/2022: Tender around incision site and medial joint line of left knee   LOWER EXTREMITY ROM:   Active ROM Right eval Left eval Left 04/02/22 Left 04/16/22 Left 04/23/22  Knee flexion 135  128 128 126 130  Knee extension -6 -10 -8 -6 -5   (Blank rows = not tested)   PROM:  PROM:  03/18/2022 04/02/22     Left Left   Knee flexion 134 134  Knee extension -6         -5      LOWER EXTREMITY MMT:   MMT Right Eval sitting Left Eval sitting  Hip flexion 28.2 lbs 32.4 lbs  Knee flexion 32.2 lbs 21.1 lbs  Knee extension 34.7 lbs 40.3 lbs   (Blank rows = not tested)      FUNCTIONAL TESTS:  03/18/2022: 5 times sit to stand: 17 seconds UE support 04/02/22: 5 times sit to stand: 16 seconds UE support 04/08/22: 5 times sit to stand: 14.69 sec with UE support   GAIT: 03/18/2022:  Distance walked: 30 feet  Assistive device utilized: Single point cane Level of assistance: Modified independence Comments: amb with antalgic gait        TODAY'S TREATMENT: 04/29/22 TherEx:      Aerobic: NuStep: Level 7 x 8 minutes Leg press: bil 106# 3x10 Leg press: single limb 50# 3x10, performed bil  Gait: Negotiated curb with SPC modified independently Amb with SPC mod I throughout session Stairs x 22: mod I with step to pattern and bil handrails   04/24/22 TherEx:      Aerobic: NuStep: Level 6 x 8 minutes     Machines: Leg press: bil LE's: 100 # 3 x 10 Leg press: Left LE only:  50# 2 x 10    Standing: Calf stretch on slant board 3x30 sec Heel/toe raises x 20 reps    Sitting: SLR 2 x 10 reps on Lt Sit to/from stand x 10 reps Hip adduction ball squeeze 20 x 5 sec hold  04/23/22 TherEx:      Aerobic: Bike: Level 5 x 5 minutes     Machines: Knee extension 15# double leg out and Left LE lower Knee flexion 15# single limb x10 each LE, 25# bil LE's x 10 Leg press: bil LE's: 10 # 3 x 10 Leg press: Left LE only:  50# 2 x 10    Standing: Calf raises x 2 holding 30 seconds Functional activities: Stairs: 1 flight, single rail using cane with close supervision Walking on Airex beam x 6 c UE support Side stepping on Airex beam x 6 c UE support Standing on Airex marching c single  UE support Standing on Airex: feet shoulder width apart, no UE support, looking up, down, and side to side c  close supervision     04/16/22 TherEx:      Aerobic: NuStep L6 x 8 min, no UE's     Machines: Knee extension 10# single limb x10 each LE Knee flexion 15# single limb x10 each LE, 25# bil LE's x 10 Leg press: bil LE's: 62# 3 x 10 Leg press: Left LE only:  31# 3 x 10    Standing: Step ups on 6 inch step x 15 Lateral step up and over x 15 on 6 inch step Cone taps x 10 alternating each LE, pt requiring hand held assist Functional activities: Stairs: 1 flight, single rail using cane with close supervision  Modalities: Moist heat to left quads x 5 minutes Manual:  Percussion device: x 3 minutes, pt with diffculty tolerating  STM to left quads x 3 minutes    PATIENT EDUCATION:  Education details: PT POC, HEP Person educated: Patient Education method: Explanation, Demonstration, Tactile cues, Verbal cues, and Handouts Education comprehension: verbalized understanding and returned demonstration     HOME EXERCISE PROGRAM: Access Code: [email or web address] URL: [email or web address] Date: 03/18/2022 Prepared by: Jennifer Martin   Exercises - Supine Bridge  - 2 x daily - 7 x weekly - 2 sets - 10 reps - 3-5 hold - Sit to Stand with Counter Support  - 2 x daily - 7 x weekly - 10 reps - Heel Raises with Counter Support  - 1 x daily - 7 x weekly - 2 sets - 10 reps - Seated Long Arc Quad  - 2 x daily - 7 x weekly - 2 sets - 10 reps - 3-5 seconds hold - Standing Hip Abduction with Counter Support  - 2 x daily - 7 x weekly - 2 sets - 10 reps - Standing Hip Extension with Counter Support  - 2 x daily - 7 x weekly - 2 sets - 10 reps - Seated Passive Knee Extension  - 1 x daily - 7 x weekly - 5-10 reps - 5 seconds hold   ASSESSMENT:   CLINICAL IMPRESSION: Pt has demonstrated progress towards all LTGs and has partially met most of them.  She reports increased depression stating  her current medications are not working.  Encouraged her to reach out to her psychiatrist to discuss concerns.  Plan for d/c next visit.   OBJECTIVE IMPAIRMENTS Abnormal gait, decreased balance, decreased mobility, difficulty walking, decreased ROM, decreased strength, increased edema, impaired flexibility, and pain.    ACTIVITY LIMITATIONS bending, sitting, standing, squatting, sleeping, and stairs   PARTICIPATION LIMITATIONS: cleaning, driving, and shopping   PERSONAL FACTORS COPD, bipolar disorder, PTSD,  Rt THA 8/ 2020. Left THA 03/2019 are also affecting patient's functional outcome.    REHAB POTENTIAL: Good   CLINICAL DECISION MAKING: Stable/uncomplicated   EVALUATION COMPLEXITY: Low     GOALS: Goals reviewed with patient? Yes   SHORT TERM GOALS: Target date: 04/15/2022  Patient will demonstrate independent use of initial home exercise program to maintain progress from in clinic treatments. Goal status: MET 04/02/22        2. Pt will be able to perform 5 time sit to stand in </= 15 seconds with no UE support:             A. Goal Status: MET 04/08/22       Long term PT goals (target date: 06/14/2022) Patient will demonstrate/report pain at worst less than or equal to 2/10 to facilitate minimal limitation in daily activity secondary to pain symptoms. Goal status: MET 04/29/22     Patient will demonstrate independent use of home exercise program to facilitate ability to maintain/progress functional gains from skilled physical therapy services. Goal status: Partially MET 04/29/22   Patient will demonstrate FOTO outcome > or = 53 % to indicate reduced disability due to condition. Goal status: NOT MET 04/29/22   Pt will be able to navigate 1 flight of stairs with step over step pattern with single hand rail.  Goal status: Partially met: step to with bil handrails to simulate beach stairs 04/29/22   5.  Pt will be able to amb with no device > 500 feet on community surfaces including curb  step with no device.   Goal status: partially met: needs SPC for safe mobility (due to other co morbidities not knee)     PLAN: PT FREQUENCY:  2-3x/ week    PT DURATION: 12 weeks   PLANNED INTERVENTIONS: Therapeutic exercises, Therapeutic activity, Neuromuscular re-education, Balance training, Gait training, Patient/Family education, Joint mobilization, Stair training, Dry Needling, Electrical stimulation, Cryotherapy, Moist heat, Taping, Vasopneumatic device, and Manual therapy   PLAN FOR NEXT SESSION: plan for d/c, update HEP PRN monitor SOB/O2, LE strengthening,      F , PT, DPT 04/29/22 1:43 PM       

## 2022-05-01 ENCOUNTER — Encounter: Payer: Self-pay | Admitting: Physical Therapy

## 2022-05-01 ENCOUNTER — Ambulatory Visit (INDEPENDENT_AMBULATORY_CARE_PROVIDER_SITE_OTHER): Payer: Medicare Other | Admitting: Physical Therapy

## 2022-05-01 DIAGNOSIS — M6281 Muscle weakness (generalized): Secondary | ICD-10-CM

## 2022-05-01 DIAGNOSIS — M25562 Pain in left knee: Secondary | ICD-10-CM | POA: Diagnosis not present

## 2022-05-01 DIAGNOSIS — M25662 Stiffness of left knee, not elsewhere classified: Secondary | ICD-10-CM

## 2022-05-01 DIAGNOSIS — R262 Difficulty in walking, not elsewhere classified: Secondary | ICD-10-CM | POA: Diagnosis not present

## 2022-05-01 DIAGNOSIS — R600 Localized edema: Secondary | ICD-10-CM

## 2022-05-01 NOTE — Therapy (Signed)
OUTPATIENT PHYSICAL THERAPY TREATMENT NOTE DISCHARGE SUMMARY         Patient Name: Judy [name] MRN: [id number] DOB:[birth year], 71 y.o., female Today's Date: 05/01/2022   END OF SESSION:   PT End of Session - 05/01/22 1429     Visit Number 11    Authorization Type KX modifier after 15 visits    Progress Note Due on Visit 20    PT Start Time 1430    Activity Tolerance Patient limited by fatigue    Behavior During Therapy [facility] for tasks assessed/performed                      Past Medical History:  Diagnosis Date   Anxiety    Arthritis    Avascular necrosis ([place])    Left hip   Avascular necrosis of bone of hip, left ([place]) 12/28/2018   Avascular necrosis of bone of right hip ([place]) 04/28/2019   Bipolar 1 disorder ([place])    COPD (chronic obstructive pulmonary disease) ([place])    Depression    Dyspnea    Pneumonia    PTSD (post-traumatic stress disorder)    Past Surgical History:  Procedure Laterality Date   bone spurs foot Right    CATARACT EXTRACTION Bilateral    COLONOSCOPY     HAND SURGERY Left    JOINT REPLACEMENT     TONSILLECTOMY     TONSILLECTOMY AND ADENOIDECTOMY     TOTAL HIP ARTHROPLASTY Left 03/19/2019   Procedure: LEFT TOTAL HIP ARTHROPLASTY ANTERIOR APPROACH;  Surgeon: Mcarthur Rossetti, MD;  Location: [facility];  Service: Orthopedics;  Laterality: Left;   TOTAL HIP ARTHROPLASTY Right 06/04/2019   Procedure: RIGHT TOTAL HIP ARTHROPLASTY ANTERIOR APPROACH;  Surgeon: Mcarthur Rossetti, MD;  Location: [facility];  Service: Orthopedics;  Laterality: Right;   TOTAL KNEE ARTHROPLASTY Left 12/28/2021   Procedure: Left TOTAL KNEE ARTHROPLASTY;  Surgeon: Mcarthur Rossetti, MD;  Location: [facility];  Service: Orthopedics;  Laterality: Left;   Patient Active Problem List   Diagnosis Date Noted   Status post total left knee replacement 12/28/2021   Primary osteoarthritis of left knee 10/25/2021   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 ([place]) 11/30/2020   Status post total replacement of right hip 06/04/2019   Status post total replacement of left hip 03/19/2019   PTSD (post-traumatic stress disorder)    COPD (chronic obstructive pulmonary disease) ([facility])    Bipolar 1 disorder ([place])    Anxiety    B12 deficiency 12/09/2018   Hyperglycemia 04/14/2017     THERAPY DIAG:  Acute pain of left knee  Stiffness of left knee, not elsewhere classified  Muscle weakness (generalized)  Difficulty in walking, not elsewhere classified  Localized edema  PCP: Caren Macadam, MD   REFERRING PROVIDER: Mcarthur Rossetti, MD   REFERRING DIAG: [id number] (ICD-10-CM) - Status post total left knee replacement   Rationale for Evaluation and Treatment Rehabilitation   ONSET DATE: TKA 12/28/2021   SUBJECTIVE:    SUBJECTIVE STATEMENT: Walked up the stairs to appt today; did call psychiatrist and reports she slept much better  PERTINENT HISTORY: COPD, PTSD,  Rt THA 8/ 2020. Left THA 03/2019   PAIN:  Are you having pain? Yes: NPRS scale: 1/10 Pain location: left knee medial knee joint Pain description: achy Aggravating factors: walking, standing, bending Relieving factors: pain meds, ice   PRECAUTIONS: None     OBJECTIVE:      PATIENT SURVEYS:  03/18/2022:  FOTO 38% (predicted 53%) 04/24/22: FOTO 47 04/29/22: FOTO 44    EDEMA:  03/18/2022: Circumferential: Rt knee: 38 centimeters                                             Left knee 40 centimeters   MUSCLE LENGTH: 03/18/2022:  Hamstrings: Right 82 deg; Left 75 deg   PALPATION: 03/18/2022: Tender around incision site and medial joint line of left knee   LOWER EXTREMITY ROM:   Active ROM Right eval Left eval Left 04/02/22 Left 04/16/22 Left 04/23/22  Knee flexion 135 128 128 126 130  Knee extension -6 -10 -8 -6 -5   (Blank rows = not tested)   PROM:  PROM:  03/18/2022 04/02/22     Left Left   Knee flexion 134 134  Knee extension -6         -5       LOWER EXTREMITY MMT:   MMT Right Eval sitting Left Eval sitting  Hip flexion 28.2 lbs 32.4 lbs  Knee flexion 32.2 lbs 21.1 lbs  Knee extension 34.7 lbs 40.3 lbs   (Blank rows = not tested)      FUNCTIONAL TESTS:  03/18/2022: 5 times sit to stand: 17 seconds UE support 04/02/22: 5 times sit to stand: 16 seconds UE support 04/08/22: 5 times sit to stand: 14.69 sec with UE support   GAIT: 03/18/2022:  Distance walked: 30 feet  Assistive device utilized: Single point cane Level of assistance: Modified independence Comments: amb with antalgic gait        TODAY'S TREATMENT: 05/01/22 Therex: NuStep: Level 6 x 8 minutes Leg press: bil 106# 3x10 Leg press: single limb 50# 3x10, performed bil Slant board stretch 3x30 sec  Calf raises x 20 reps Standing hip abduction 2x10 bil; bil UE support Standing hip extension 2x10 bil; bil UE support Seated LAQ 2x10 bil; 3 sec hold   04/29/22 TherEx:      Aerobic: NuStep: Level 7 x 8 minutes Leg press: bil 106# 3x10 Leg press: single limb 50# 3x10, performed bil  Gait: Negotiated curb with SPC modified independently Amb with SPC mod I throughout session Stairs x 22: mod I with step to pattern and bil handrails   04/24/22 TherEx:      Aerobic: NuStep: Level 6 x 8 minutes     Machines: Leg press: bil LE's: 100 # 3 x 10 Leg press: Left LE only:  50# 2 x 10    Standing: Calf stretch on slant board 3x30 sec Heel/toe raises x 20 reps    Sitting: SLR 2 x 10 reps on Lt Sit to/from stand x 10 reps Hip adduction ball squeeze 20 x 5 sec hold   PATIENT EDUCATION:  Education details: PT POC, HEP Person educated: Patient Education method: Consulting civil engineer, Demonstration, Corporate treasurer cues, Verbal cues, and Handouts Education comprehension: verbalized understanding and returned demonstration     HOME EXERCISE PROGRAM: Access Code: [email or web address] URL: [email or web address] Date: 03/18/2022 Prepared by: Kearney Hard    Exercises - Supine Bridge  - 2 x daily - 7 x weekly - 2 sets - 10 reps - 3-5 hold - Sit to Stand with Counter Support  - 2 x daily - 7 x weekly - 10 reps - Heel Raises with Counter Support  - 1 x daily - 7 x weekly - 2 sets - 10 reps - Seated  Long Arc Quad  - 2 x daily - 7 x weekly - 2 sets - 10 reps - 3-5 seconds hold - Standing Hip Abduction with Counter Support  - 2 x daily - 7 x weekly - 2 sets - 10 reps - Standing Hip Extension with Counter Support  - 2 x daily - 7 x weekly - 2 sets - 10 reps - Seated Passive Knee Extension  - 1 x daily - 7 x weekly - 5-10 reps - 5 seconds hold   ASSESSMENT:   CLINICAL IMPRESSION: Session today focused on review of HEP to continue at home.  She hasn't really been completing them at home and expresses that all she really wants to do is lay in bed, and that depression medication isn't working.  Strongly encouraged her to reach out to psychiatrist to discuss.  Will d/c PT today.   OBJECTIVE IMPAIRMENTS Abnormal gait, decreased balance, decreased mobility, difficulty walking, decreased ROM, decreased strength, increased edema, impaired flexibility, and pain.    ACTIVITY LIMITATIONS bending, sitting, standing, squatting, sleeping, and stairs   PARTICIPATION LIMITATIONS: cleaning, driving, and shopping   PERSONAL FACTORS COPD, bipolar disorder, PTSD,  Rt THA 8/ 2020. Left THA 03/2019 are also affecting patient's functional outcome.    REHAB POTENTIAL: Good   CLINICAL DECISION MAKING: Stable/uncomplicated   EVALUATION COMPLEXITY: Low     GOALS: Goals reviewed with patient? Yes   SHORT TERM GOALS: Target date: 04/15/2022  Patient will demonstrate independent use of initial home exercise program to maintain progress from in clinic treatments. Goal status: MET 04/02/22        2. Pt will be able to perform 5 time sit to stand in </= 15 seconds with no UE support:             A. Goal Status: MET 04/08/22       Long term PT goals (target date:  06/14/2022) Patient will demonstrate/report pain at worst less than or equal to 2/10 to facilitate minimal limitation in daily activity secondary to pain symptoms. Goal status: MET 04/29/22   Patient will demonstrate independent use of home exercise program to facilitate ability to maintain/progress functional gains from skilled physical therapy services. Goal status: Partially MET 04/29/22   Patient will demonstrate FOTO outcome > or = 53 % to indicate reduced disability due to condition. Goal status: NOT MET 04/29/22   Pt will be able to navigate 1 flight of stairs with step over step pattern with single hand rail.  Goal status: Partially met: step to with bil handrails to simulate beach stairs 04/29/22   5.  Pt will be able to amb with no device > 500 feet on community surfaces including curb step with no device.   Goal status: partially met: needs SPC for safe mobility (due to other co morbidities not knee)     PLAN: PT FREQUENCY:  2-3x/ week    PT DURATION: 12 weeks   PLANNED INTERVENTIONS: Therapeutic exercises, Therapeutic activity, Neuromuscular re-education, Balance training, Gait training, Patient/Family education, Joint mobilization, Stair training, Dry Needling, Electrical stimulation, Cryotherapy, Moist heat, Taping, Vasopneumatic device, and Manual therapy   PLAN FOR NEXT SESSION: d/c PT today   Laureen Abrahams, PT, DPT 05/01/22 2:35 PM      PHYSICAL THERAPY DISCHARGE SUMMARY  Visits from Start of Care: 11  Current functional level related to goals / functional outcomes: See above   Remaining deficits: See above   Education / Equipment: HEP - pt reports very limited  compliance   Patient agrees to discharge. Patient goals were partially met. Patient is being discharged due to maximized rehab potential.   Encouraged her to work on HEP as she hasn't been doing exercises at home; and feel she could meet goals with improved compliance.  Laureen Abrahams,  PT, DPT 05/01/22 2:52 PM  [facility] [address] [place], [place], [address] Phone: [phone]   Fax:  [phone]

## 2022-05-03 ENCOUNTER — Telehealth: Payer: Self-pay | Admitting: Pharmacist

## 2022-05-03 NOTE — Chronic Care Management (AMB) (Signed)
    Chronic Care Management Pharmacy Assistant   Name: Judy [name]  MRN: [id number] DOB: September 24, 1951  Reason for Encounter: PAP for Trelegy  Spoke with patient, she states Trelegy was approved thorough patient assistance with pulmonologist office, she was shipped a 1 month supply. She is not taking Breztri as it was given to replace Trelegy while waiting for pap to be approved.  Patient plans to call pulmonology office and follow up with Xopenex and to see why Trelegy was only sent for 1 month supply.    Inetta Fermo [facility]  Clinical Pharmacist Assistant [phone]

## 2022-05-06 NOTE — Addendum Note (Signed)
Addended by: Verner Chol on: 05/06/2022 11:37 AM   Modules accepted: Orders

## 2022-05-10 ENCOUNTER — Telehealth: Payer: Self-pay | Admitting: Pulmonary Disease

## 2022-05-10 MED ORDER — TRELEGY ELLIPTA 100-62.5-25 MCG/ACT IN AEPB: 1.0000 | INHALATION_SPRAY | Freq: Every day | RESPIRATORY_TRACT | 3 refills | Status: DC

## 2022-05-10 NOTE — Telephone Encounter (Signed)
Rx for pt's trelegy has been sent to [facility] as a 90-day supply for pt. Called and spoke with pt letting her know this had been fixed and she verbalized understanding.   While speaking with pt, pt stated she had knee surgery a couple months ago and has been going through PT. Pt stated that she has been having problems with SOB and someone requested to her that it might be beneficial for her to do pulmonary rehab.  Dr. Isaiah Serge, please advise if you would be okay with [place] referring her to pulmonary rehab to see if this would help with her breathing.

## 2022-05-16 ENCOUNTER — Ambulatory Visit: Payer: Medicare Other | Attending: Rheumatology | Admitting: *Deleted

## 2022-05-16 VITALS — BP 124/74 | HR 86

## 2022-05-16 DIAGNOSIS — M45 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: Secondary | ICD-10-CM | POA: Insufficient documentation

## 2022-05-16 MED ORDER — CERTOLIZUMAB PEGOL 2 X 200 MG ~~LOC~~ KIT
400.0000 mg | PACK | Freq: Once | SUBCUTANEOUS | Status: AC
  Administered 2022-05-16: 400 mg via SUBCUTANEOUS

## 2022-05-16 NOTE — Progress Notes (Signed)
Subjective:   Patient presents to clinic today to receive monthly dose of Cimzia.  Patient running a fever or have signs/symptoms of infection? No  Patient currently on antibiotics for the treatment of infection? No  Patient have any upcoming invasive procedures/surgeries? No  Objective: CMP     Component Value Date/Time   NA 140 04/18/2022 1519   K 4.6 04/18/2022 1519   CL 106 04/18/2022 1519   CO2 24 04/18/2022 1519   GLUCOSE 104 (H) 04/18/2022 1519   BUN 12 04/18/2022 1519   CREATININE 0.94 04/18/2022 1519   CALCIUM 9.8 04/18/2022 1519   PROT 6.6 04/18/2022 1519   ALBUMIN 4.2 10/30/2020 1227   AST 15 04/18/2022 1519   ALT 19 04/18/2022 1519   ALKPHOS 144 (H) 10/30/2020 1227   BILITOT 0.3 04/18/2022 1519   GFRNONAA >60 12/29/2021 0320   GFRNONAA 47 (L) 04/11/2021 1419   GFRAA 54 (L) 04/11/2021 1419    CBC    Component Value Date/Time   WBC 5.3 04/18/2022 1519   RBC 4.36 04/18/2022 1519   HGB 11.8 04/18/2022 1519   HCT 37.3 04/18/2022 1519   PLT 269 04/18/2022 1519   MCV 85.6 04/18/2022 1519   MCH 27.1 04/18/2022 1519   MCHC 31.6 (L) 04/18/2022 1519   RDW 13.9 04/18/2022 1519   LYMPHSABS 1,818 04/18/2022 1519   MONOABS 0.4 10/30/2020 1227   EOSABS 111 04/18/2022 1519   BASOSABS 21 04/18/2022 1519    Baseline Immunosuppressant Therapy Labs TB GOLD    Latest Ref Rng & Units 04/18/2022    3:19 PM  Quantiferon TB Gold  Quantiferon TB Gold Plus NEGATIVE NEGATIVE    Hepatitis Panel    Latest Ref Rng & Units 04/26/2020   11:26 AM  Hepatitis  Hep B Surface Ag NON-REACTI NON-REACTIVE   Hep B IgM NON-REACTI NON-REACTIVE    HIV Lab Results  Component Value Date   HIV NON-REACTIVE 04/26/2020   HIV Non Reactive 08/23/2017   HIV Non Reactive 04/14/2017   Immunoglobulins    Latest Ref Rng & Units 04/26/2020   11:26 AM  Immunoglobulin Electrophoresis  IgA  70 - 320 mg/dL 120   IgG 600 - 1,540 mg/dL 824   IgM 50 - 300 mg/dL 47    SPEP    Latest Ref Rng  & Units 04/18/2022    3:19 PM  Serum Protein Electrophoresis  Total Protein 6.1 - 8.1 g/dL 6.6    G6PD No results found for: "G6PDH" TPMT No results found for: "TPMT"   Chest x-ray: 02/01/2022 Chronic appearing increased interstitial lung markings without evidence of acute or active cardiopulmonary disease.  Assessment/Plan:   Administrations This Visit     certolizumab pegol (CIMZIA) kit 400 mg     Admin Date 05/16/2022 Action Given Dose 400 mg Route Subcutaneous Administered By Carole Binning, LPN             Patient tolerated injection well.   Appointment for next injection scheduled for 06/13/2022.  Patient due for labs in October 2023.  Patient is to call and reschedule appointment if running a fever with signs/symptoms of infection, on antibiotics for active infection or has an upcoming invasive procedure.  All questions encouraged and answered.  Instructed patient to call with any further questions or concerns.

## 2022-05-22 NOTE — Telephone Encounter (Signed)
Called patient to let her know that I was sending in the referral to pulmonary rehab. She verbalized understanding. But while on the phone she was asking about her ventolin inhaler refilled. She states her current one is a few years old and she is not sure if she needs a rescue inhaler. But I saw in May we sent in the Xopenex inhaler.   Sir would you rather her be on the Xopenex inhaler or Ventolin. Patient states she never received the other inhaler.   Please advise sir

## 2022-05-22 NOTE — Telephone Encounter (Signed)
Yes.  We can refer to pulmonary rehab.

## 2022-05-28 MED ORDER — LEVALBUTEROL TARTRATE 45 MCG/ACT IN AERO: 1.0000 | INHALATION_SPRAY | Freq: Four times a day (QID) | RESPIRATORY_TRACT | 11 refills | Status: DC | PRN

## 2022-05-28 NOTE — Telephone Encounter (Signed)
Xopenex rx sent

## 2022-05-28 NOTE — Telephone Encounter (Signed)
Please refill the Xopenex inhaler.

## 2022-06-06 ENCOUNTER — Ambulatory Visit: Payer: Medicare Other | Attending: Physician Assistant | Admitting: Physician Assistant

## 2022-06-06 ENCOUNTER — Encounter: Payer: Self-pay | Admitting: Physician Assistant

## 2022-06-06 VITALS — BP 127/88 | HR 74 | Resp 20 | Ht 70.0 in | Wt 215.0 lb

## 2022-06-06 DIAGNOSIS — Z8659 Personal history of other mental and behavioral disorders: Secondary | ICD-10-CM | POA: Diagnosis present

## 2022-06-06 DIAGNOSIS — Z96643 Presence of artificial hip joint, bilateral: Secondary | ICD-10-CM

## 2022-06-06 DIAGNOSIS — F319 Bipolar disorder, unspecified: Secondary | ICD-10-CM | POA: Insufficient documentation

## 2022-06-06 DIAGNOSIS — M45 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: Secondary | ICD-10-CM | POA: Diagnosis present

## 2022-06-06 DIAGNOSIS — M5136 Other intervertebral disc degeneration, lumbar region: Secondary | ICD-10-CM | POA: Diagnosis present

## 2022-06-06 DIAGNOSIS — Z87891 Personal history of nicotine dependence: Secondary | ICD-10-CM | POA: Diagnosis present

## 2022-06-06 DIAGNOSIS — Z79899 Other long term (current) drug therapy: Secondary | ICD-10-CM | POA: Diagnosis present

## 2022-06-06 DIAGNOSIS — Z8709 Personal history of other diseases of the respiratory system: Secondary | ICD-10-CM

## 2022-06-06 DIAGNOSIS — M533 Sacrococcygeal disorders, not elsewhere classified: Secondary | ICD-10-CM | POA: Diagnosis present

## 2022-06-06 DIAGNOSIS — M4807 Spinal stenosis, lumbosacral region: Secondary | ICD-10-CM

## 2022-06-06 DIAGNOSIS — E538 Deficiency of other specified B group vitamins: Secondary | ICD-10-CM

## 2022-06-06 DIAGNOSIS — Z96652 Presence of left artificial knee joint: Secondary | ICD-10-CM | POA: Diagnosis present

## 2022-06-06 DIAGNOSIS — R918 Other nonspecific abnormal finding of lung field: Secondary | ICD-10-CM | POA: Diagnosis present

## 2022-06-06 DIAGNOSIS — F4310 Post-traumatic stress disorder, unspecified: Secondary | ICD-10-CM

## 2022-06-06 DIAGNOSIS — G8929 Other chronic pain: Secondary | ICD-10-CM | POA: Insufficient documentation

## 2022-06-06 MED ORDER — PREDNISONE 5 MG PO TABS: ORAL_TABLET | ORAL | 0 refills | Status: DC

## 2022-06-06 NOTE — Addendum Note (Signed)
Addended by: Ellen Henri on: 06/06/2022 04:26 PM   Modules accepted: Orders

## 2022-06-06 NOTE — Progress Notes (Signed)
Office Visit Note  [name]             Date of Birth: 1951-06-05           MRN: [id number]             PCP: Pcp, No Referring: No ref. provider found Visit Date: 06/06/2022 Occupation: @GUAROCC @  Subjective:  Generalized pain   History of Present Illness: Judy [name] is a 71 y.o. female with history of ankylosing spondylitis.  Patient restarted on Cimzia on 04/18/2022 and had her second dose administered on 05/16/2022.  She has been tolerating Cimzia without any injection site reactions or side effects.  She has not had any recent or recurrent infections.  Patient reports that for the past 1 and half week she has been experiencing a flare involving multiple joints.  She is having increased pain and stiffness in both shoulders, both wrists, both hands, right knee, and her lower back.  She states overall her left knee replacement is doing well.  She continues to use a cane to assist with ambulation.  She denies any visible joint swelling or warmth at this time.  She has been taking ibuprofen 800 mg 2-3 times per day for symptomatic relief.  She has been having interrupted sleep at night due to the severity of her joint pain.      Activities of Daily Living:  Patient reports morning stiffness for all day. Patient Reports nocturnal pain.  Difficulty dressing/grooming: Reports Difficulty climbing stairs: Reports Difficulty getting out of chair: Reports Difficulty using hands for taps, buttons, cutlery, and/or writing: Reports  Review of Systems  Constitutional:  Positive for fatigue.  HENT:  Negative for mouth sores, mouth dryness and nose dryness.   Eyes:  Negative for pain, visual disturbance and dryness.  Respiratory:  Negative for cough, hemoptysis, shortness of breath and difficulty breathing.   Cardiovascular:  Negative for chest pain, palpitations, hypertension and swelling in legs/feet.  Gastrointestinal:  Negative for blood in stool, constipation and  diarrhea.  Endocrine: Negative for increased urination.  Genitourinary:  Negative for painful urination and involuntary urination.  Musculoskeletal:  Positive for joint pain, gait problem, joint pain, joint swelling, myalgias, muscle weakness, morning stiffness, muscle tenderness and myalgias.  Skin:  Positive for hair loss. Negative for color change, pallor, rash, nodules/bumps, skin tightness, ulcers and sensitivity to sunlight.  Allergic/Immunologic: Negative for susceptible to infections.  Neurological:  Positive for headaches. Negative for dizziness, numbness and weakness.  Hematological:  Negative for swollen glands.  Psychiatric/Behavioral:  Positive for depressed mood and sleep disturbance. The patient is nervous/anxious.     PMFS History:  Patient Active Problem List   Diagnosis Date Noted   Status post total left knee replacement 12/28/2021   Primary osteoarthritis of left knee 10/25/2021   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) 11/30/2020   Status post total replacement of right hip 06/04/2019   Status post total replacement of left hip 03/19/2019   PTSD (post-traumatic stress disorder)    COPD (chronic obstructive pulmonary disease) (HCC)    Bipolar 1 disorder (HCC)    Anxiety    B12 deficiency 12/09/2018   Hyperglycemia 04/14/2017    Past Medical History:  Diagnosis Date   Anxiety    Arthritis    Avascular necrosis (HCC)    Left hip   Avascular necrosis of bone of hip, 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   Bipolar  1 disorder (HCC)    COPD (chronic obstructive pulmonary disease) (HCC)    Depression    Dyspnea    Pneumonia    PTSD (post-traumatic stress disorder)     Family History  Problem Relation Age of Onset   CAD Mother 86   COPD Mother    Depression Mother    Heart attack Father    Colon polyps Sister    Heart attack Maternal Grandmother    Early death Maternal Grandfather    Hearing loss  Paternal Grandfather    Asthma Daughter    Bipolar disorder Son    Past Surgical History:  Procedure Laterality Date   bone spurs foot Right    CATARACT EXTRACTION Bilateral    COLONOSCOPY     HAND SURGERY Left    JOINT REPLACEMENT     TONSILLECTOMY     TONSILLECTOMY AND ADENOIDECTOMY     TOTAL HIP ARTHROPLASTY Left 03/19/2019   Procedure: LEFT TOTAL HIP ARTHROPLASTY ANTERIOR APPROACH;  Surgeon: [name], MD;  Location: [facility];  Service: Orthopedics;  Laterality: Left;   TOTAL HIP ARTHROPLASTY Right 06/04/2019   Procedure: RIGHT TOTAL HIP ARTHROPLASTY ANTERIOR APPROACH;  Surgeon: [name], MD;  Location: [facility];  Service: Orthopedics;  Laterality: Right;   TOTAL KNEE ARTHROPLASTY Left 12/28/2021   Procedure: Left TOTAL KNEE ARTHROPLASTY;  Surgeon: [name], MD;  Location: [facility];  Service: Orthopedics;  Laterality: Left;   Social History   Social History Narrative   Not on file   Immunization History  Administered Date(s) Administered   Influenza, High Dose Seasonal PF 08/25/2018   Moderna Sars-Covid-2 Vaccination 11/08/2019, 12/05/2019, 10/09/2020   Pneumococcal Conjugate-13 09/23/2018   Pneumococcal Polysaccharide-23 08/27/2016   Tdap 10/07/2012   Yellow Fever 07/05/2008     Objective: Vital Signs: BP 127/88 (BP Location: Left Arm, Patient Position: Sitting, Cuff Size: Normal)   Pulse 74   Resp 20   Ht 5\' 10"  (1.778 m)   Wt 215 lb (97.5 kg)   BMI 30.85 kg/m    Physical Exam Vitals and nursing note reviewed.  Constitutional:      Appearance: She is well-developed.  HENT:     Head: Normocephalic and atraumatic.  Eyes:     Conjunctiva/sclera: Conjunctivae normal.  Cardiovascular:     Rate and Rhythm: Normal rate and regular rhythm.     Heart sounds: Normal heart sounds.  Pulmonary:     Effort: Pulmonary effort is normal.     Breath sounds: Normal breath sounds.  Abdominal:     General: Bowel sounds are normal.      Palpations: Abdomen is soft.  Musculoskeletal:     Cervical back: Normal range of motion.  Skin:    General: Skin is warm and dry.     Capillary Refill: Capillary refill takes less than 2 seconds.  Neurological:     Mental Status: She is alert and oriented to person, place, and time.  Psychiatric:        Behavior: Behavior normal.      Musculoskeletal Exam: Patient remained seated during the examination today.  C-spine has limited range of motion with lateral rotation especially to the left.  She has painful and limited flexion extension of the C-spine.  Midline spinal tenderness in the lumbar region noted.  Mild tenderness over SI joints.  Shoulder joints have painful range of motion with stiffness bilaterally.  Elbow joints have good range of motion with no tenderness or inflammation.  Wrist  joints have tenderness bilaterally.  Tenderness over MCP joints but no synovitis noted.  Complete fist formation bilaterally.  Hip joints difficult to assess in seated position.  Right knee has painful range of motion.  Left knee replacement has full extension with no discomfort.  Ankle joints have good range of motion with some tenderness palpation bilaterally.  CDAI Exam: CDAI Score: -- Patient Global: --; Provider Global: -- Swollen: --; Tender: -- Joint Exam 06/06/2022   No joint exam has been documented for this visit   There is currently no information documented on the homunculus. Go to the Rheumatology activity and complete the homunculus joint exam.  Investigation: No additional findings.  Imaging: No results found.  Recent Labs: Lab Results  Component Value Date   WBC 5.3 04/18/2022   HGB 11.8 04/18/2022   PLT 269 04/18/2022   NA 140 04/18/2022   K 4.6 04/18/2022   CL 106 04/18/2022   CO2 24 04/18/2022   GLUCOSE 104 (H) 04/18/2022   BUN 12 04/18/2022   CREATININE 0.94 04/18/2022   BILITOT 0.3 04/18/2022   ALKPHOS 144 (H) 10/30/2020   AST 15 04/18/2022   ALT 19 04/18/2022    PROT 6.6 04/18/2022   ALBUMIN 4.2 10/30/2020   CALCIUM 9.8 04/18/2022   GFRAA 54 (L) 04/11/2021   QFTBGOLDPLUS NEGATIVE 04/18/2022    Speciality Comments: Cimzia completed loading on June 22, 2020  Procedures:  No procedures performed Allergies: Celexa [citalopram hydrobromide] and Gabapentin   Assessment / Plan:     Visit Diagnoses: 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) - C-spine, thoracic spine, and lumbar spine fused: Patient presents today experiencing a flare involving multiple joints.  For the past 1-1/2 weeks she has been experiencing increased joint stiffness and discomfort in both shoulders, both wrists, both hands, right knee, and her lower back.  On examination today there was no obvious synovitis or dactylitis.  No evidence of Achilles tendinitis or planter fasciitis.  She has been taking ibuprofen 800 mg 2-3 times per day for pain relief.  She has tenderness palpation over both wrist joints, MCPs, and midline spinal tenderness in the lumbar region.  She has significantly limited C-spine range of motion with lateral rotation especially to the left.   She is currently on Cimzia 400 mg subcutaneous injections every 4 weeks.  She reinitiated therapy on 04/18/2022 and had her second dose on 05/16/2022.  She has been tolerating Cimzia without any side effects or injection site reactions.  She has not had any recent or recurrent infections.   She was recently traveling which could have contributed to her flare due to having to ride in a car for 5 hours as well as sleep in a different bed while on vacation.  She is willing to give Cimzia more time and for [place] to reassess for the full efficacy in 2 months.  In the meantime a prednisone taper starting at 20 mg tapering by 5 mg every 4 days was sent to the pharmacy to treat her current flare.  Instructions were provided including the importance of avoiding concurrent use with NSAIDs.  She was encouraged to take prednisone in  the morning with food.  She was advised to notify us if her symptoms persist or worsen.  She plans on returning on 06/13/2022 for her next Cimzia injection.  High risk medication use - Cimzia 400 mg sq injections every 4 weeks.  Patient reinitiated Cimzia on 04/18/2022 and had her second dose administered on 05/16/2022.  She has been tolerating Cimzia without any side effects or injection site reactions.   CBC and CMP were updated on 04/18/2022.  I would recommend having updated CBC and CMP with her next injection on 06/13/22.   TB Gold negative on 04/18/2022. Discussed the importance of holding Cimzia if she develops signs or symptoms of an infection and to resume once the infection is completely cleared. Discussed the importance of yearly skin cancer screening with her dermatologist while on Cimzia.  DDD (degenerative disc disease), lumbar: Chronic pain.  She is currently having increased discomfort in her lower back.  She has some midline spinal tenderness but no symptoms of radiculopathy currently.  She has been using a cane to assist with ambulation.  Spinal stenosis of lumbosacral region: Chronic pain.  Chronic SI joint pain: She has intermittent discomfort in both SI joints.  She has mild SI joint tenderness upon palpation today.  Status post total replacement of both hips - Hx of avascular necrosis.  She has been experiencing increased discomfort in both hips.  S/P total knee arthroplasty, left - Dr. Blackman-12/28/21.  Doing well.  She has full extension with no discomfort at this time.  She plans to continue to work on lower extremity muscle strengthening.  Other medical conditions are listed as follows:   Bipolar 1 disorder (HCC)  PTSD (post-traumatic stress disorder)  B12 deficiency  History of anxiety  History of COPD  Pulmonary nodules  Former smoker  Orders: No orders of the defined types were placed in this encounter.  Meds ordered this encounter  Medications    predniSONE (DELTASONE) 5 MG tablet    Sig: Take 4 tablets by mouth daily x4 days, 3 tablets daily x4 days, 2 tablets daily x4 days, 1 tablet daily x4 days.    Dispense:  40 tablet    Refill:  0    Follow-Up Instructions: Return in about 8 months (around 02/04/2023) for Ankylosing Spondylitis.   Gearldine Bienenstock, PA-C  Note - This record has been created using Dragon software.  Chart creation errors have been sought, but may not always  have been located. Such creation errors do not reflect on  the standard of medical care.

## 2022-06-13 ENCOUNTER — Ambulatory Visit: Payer: Medicare Other | Attending: Rheumatology | Admitting: *Deleted

## 2022-06-13 ENCOUNTER — Ambulatory Visit: Payer: Medicare Other

## 2022-06-13 VITALS — BP 106/70 | HR 76

## 2022-06-13 DIAGNOSIS — M45 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: Secondary | ICD-10-CM | POA: Insufficient documentation

## 2022-06-13 MED ORDER — CERTOLIZUMAB PEGOL 2 X 200 MG ~~LOC~~ KIT
400.0000 mg | PACK | Freq: Once | SUBCUTANEOUS | Status: AC
  Administered 2022-06-13: 400 mg via SUBCUTANEOUS

## 2022-06-13 NOTE — Progress Notes (Signed)
Subjective:   Patient presents to clinic today to receive monthly dose of Cimzia.  Patient running a fever or have signs/symptoms of infection? No  Patient currently on antibiotics for the treatment of infection? No  Patient have any upcoming invasive procedures/surgeries? No  Objective: CMP     Component Value Date/Time   NA 140 04/18/2022 1519   K 4.6 04/18/2022 1519   CL 106 04/18/2022 1519   CO2 24 04/18/2022 1519   GLUCOSE 104 (H) 04/18/2022 1519   BUN 12 04/18/2022 1519   CREATININE 0.94 04/18/2022 1519   CALCIUM 9.8 04/18/2022 1519   PROT 6.6 04/18/2022 1519   ALBUMIN 4.2 10/30/2020 1227   AST 15 04/18/2022 1519   ALT 19 04/18/2022 1519   ALKPHOS 144 (H) 10/30/2020 1227   BILITOT 0.3 04/18/2022 1519   GFRNONAA >60 12/29/2021 0320   GFRNONAA 47 (L) 04/11/2021 1419   GFRAA 54 (L) 04/11/2021 1419    CBC    Component Value Date/Time   WBC 5.3 04/18/2022 1519   RBC 4.36 04/18/2022 1519   HGB 11.8 04/18/2022 1519   HCT 37.3 04/18/2022 1519   PLT 269 04/18/2022 1519   MCV 85.6 04/18/2022 1519   MCH 27.1 04/18/2022 1519   MCHC 31.6 (L) 04/18/2022 1519   RDW 13.9 04/18/2022 1519   LYMPHSABS 1,818 04/18/2022 1519   MONOABS 0.4 10/30/2020 1227   EOSABS 111 04/18/2022 1519   BASOSABS 21 04/18/2022 1519    Baseline Immunosuppressant Therapy Labs TB GOLD    Latest Ref Rng & Units 04/18/2022    3:19 PM  Quantiferon TB Gold  Quantiferon TB Gold Plus NEGATIVE NEGATIVE    Hepatitis Panel    Latest Ref Rng & Units 04/26/2020   11:26 AM  Hepatitis  Hep B Surface Ag NON-REACTI NON-REACTIVE   Hep B IgM NON-REACTI NON-REACTIVE    HIV Lab Results  Component Value Date   HIV NON-REACTIVE 04/26/2020   HIV Non Reactive 08/23/2017   HIV Non Reactive 04/14/2017   Immunoglobulins    Latest Ref Rng & Units 04/26/2020   11:26 AM  Immunoglobulin Electrophoresis  IgA  70 - 320 mg/dL 120   IgG 600 - 1,540 mg/dL 824   IgM 50 - 300 mg/dL 47    SPEP    Latest Ref Rng  & Units 04/18/2022    3:19 PM  Serum Protein Electrophoresis  Total Protein 6.1 - 8.1 g/dL 6.6    G6PD No results found for: "G6PDH" TPMT No results found for: "TPMT"   Chest x-ray: 02/01/2022 Chronic appearing increased interstitial lung markings without evidence of acute or active cardiopulmonary disease  Assessment/Plan:   Administrations This Visit     certolizumab pegol (CIMZIA) kit 400 mg     Admin Date 06/13/2022 Action Given Dose 400 mg Route Subcutaneous Administered By Carole Binning, LPN             Patient tolerated injection well.   Appointment for next injection scheduled for 07/11/2022.  Patient due for labs in October 2023.  Patient is to call and reschedule appointment if running a fever with signs/symptoms of infection, on antibiotics for active infection or has an upcoming invasive procedure.  All questions encouraged and answered.  Instructed patient to call with any further questions or concerns.

## 2022-06-20 ENCOUNTER — Ambulatory Visit: Payer: Medicare Other | Admitting: Physician Assistant

## 2022-06-27 ENCOUNTER — Encounter: Payer: Self-pay | Admitting: Family Medicine

## 2022-06-27 ENCOUNTER — Ambulatory Visit (INDEPENDENT_AMBULATORY_CARE_PROVIDER_SITE_OTHER): Payer: Medicare Other | Admitting: Family Medicine

## 2022-06-27 ENCOUNTER — Other Ambulatory Visit (INDEPENDENT_AMBULATORY_CARE_PROVIDER_SITE_OTHER): Payer: Medicare Other

## 2022-06-27 VITALS — BP 110/60 | HR 88 | Temp 97.8°F | Ht 70.0 in | Wt 219.2 lb

## 2022-06-27 DIAGNOSIS — R739 Hyperglycemia, unspecified: Secondary | ICD-10-CM

## 2022-06-27 DIAGNOSIS — F319 Bipolar disorder, unspecified: Secondary | ICD-10-CM

## 2022-06-27 DIAGNOSIS — Z13220 Encounter for screening for lipoid disorders: Secondary | ICD-10-CM | POA: Diagnosis not present

## 2022-06-27 LAB — LIPID PANEL
Cholesterol: 245 mg/dL — ABNORMAL HIGH (ref 0–200)
HDL: 44.2 mg/dL (ref >39.00)
NonHDL: 201.18
Total CHOL/HDL Ratio: 6
Triglycerides: 239 mg/dL — ABNORMAL HIGH (ref 0.0–149.0)
VLDL: 47.8 mg/dL — ABNORMAL HIGH (ref 0.0–40.0)

## 2022-06-27 LAB — LDL CHOLESTEROL, DIRECT: Direct LDL: 170 mg/dL

## 2022-06-27 NOTE — Progress Notes (Signed)
Established Patient Office Visit  Subjective   Patient ID: Judy [name], female    DOB: 06-Jun-1951  Age: 71 y.o. MRN: [id number]  Chief Complaint  Patient presents with   Transitions Of Care    Patient is here for transition of care visit. Patient reports that she is bipolar and has been having more crying spells recently. States that she also feels very antsy, cannot get comfortable watching TV. Patient states she is running out of her lorazepam early, she has an appointment with her psychiatrist soon. She reports a history of bipolar disorder and is also on seroquel 400 mg daily at bedtime.   I reviewed her current problem list and her medications. I also reviewed her HM measures. She UTD on her mammogram and colonoscopy, she declined her flu vaccine today.    Current Outpatient Medications  Medication Instructions   albuterol (PROVENTIL) 2.5 mg, Nebulization, Every 6 hours PRN   Certolizumab Pegol (CIMZIA [place]) Subcutaneous, Every 28 days   Cholecalciferol (VITAMIN D3) 50 MCG (2000 UT) TABS 1 tablet, Oral, Every morning   Cyanocobalamin (VITAMIN B-12 PO) 2,500 mcg, Oral, Every morning   Fluticasone-Umeclidin-Vilant (TRELEGY ELLIPTA) 100-62.5-25 MCG/ACT AEPB 1 puff, Inhalation, Daily   LORazepam (ATIVAN) 1 mg, Oral, 3 times daily   QUEtiapine (SEROQUEL) 400 mg, Oral, Daily at bedtime    Patient Active Problem List   Diagnosis Date Noted   Status post total left knee replacement 12/28/2021   Primary osteoarthritis of left knee 10/25/2021   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 ([place]) 11/30/2020   Status post total replacement of right hip 06/04/2019   Status post total replacement of left hip 03/19/2019   PTSD (post-traumatic stress disorder)    COPD (chronic obstructive pulmonary disease) (HCC)    Bipolar 1 disorder ([place])    Anxiety    B12 deficiency 12/09/2018   Hyperglycemia 04/14/2017      Review of Systems  All other systems reviewed and are  negative.     Objective:     BP 110/60 (BP Location: Left Arm, Patient Position: Sitting, Cuff Size: Normal)   Pulse 88   Temp 97.8 F (36.6 C) (Oral)   Ht 5\' 10"  (1.778 m)   Wt 219 lb 3.2 oz (99.4 kg)   SpO2 98%   BMI 31.45 kg/m  BP Readings from Last 3 Encounters:  06/27/22 110/60  06/13/22 106/70  06/06/22 127/88   Wt Readings from Last 3 Encounters:  06/27/22 219 lb 3.2 oz (99.4 kg)  06/06/22 215 lb (97.5 kg)  04/01/22 208 lb 3.2 oz (94.4 kg)      Physical Exam Vitals reviewed.  Constitutional:      Appearance: Normal appearance. She is well-groomed and normal weight.  Eyes:     Conjunctiva/sclera: Conjunctivae normal.  Neck:     Thyroid: No thyromegaly.  Cardiovascular:     Rate and Rhythm: Normal rate and regular rhythm.     Pulses: Normal pulses.     Heart sounds: S1 normal and S2 normal.  Pulmonary:     Effort: Pulmonary effort is normal.     Breath sounds: Normal breath sounds and air entry.  Abdominal:     General: Bowel sounds are normal.  Musculoskeletal:        General: Normal range of motion.     Cervical back: Normal range of motion and neck supple.     Right lower leg: No edema.     Left lower leg: No edema.  Skin:    General: Skin is warm and dry.  Neurological:     Mental Status: She is alert and oriented to person, place, and time. Mental status is at baseline.     Gait: Gait is intact.  Psychiatric:        Mood and Affect: Mood and affect normal.        Speech: Speech normal.        Behavior: Behavior normal.        Judgment: Judgment normal.    The 10-year ASCVD risk score (Arnett DK, et al., 2019) is: 12.7%    Assessment & Plan:   Problem List Items Addressed This Visit       Other   Hyperglycemia - Primary    Patient's last A1C was 5.8, she did have a slight elevation in blood sugar on the last set of labs, will check new A1C today with her lipid panel.      Relevant Orders   Hemoglobin A1c   Bipolar 1 disorder (HCC)     Sees pschiatry for this, I discussed the risk of taking more of her ativan than is prescribed and I advised she discuss this with her psychiatrist, I am not able to fill this for her at this time.       Other Visit Diagnoses     Lipid screening       Relevant Orders   Lipid Panel (Completed)     Patient had elevated cholesterol on her last set of labs, she needs a new panel today, not currently being treated with medication.   Return in about 1 year (around 06/28/2023) for Annual Physical Exam.    Karie Georges, MD

## 2022-06-28 NOTE — Assessment & Plan Note (Signed)
Patient's last A1C was 5.8, she did have a slight elevation in blood sugar on the last set of labs, will check new A1C today with her lipid panel.

## 2022-06-28 NOTE — Assessment & Plan Note (Signed)
Sees pschiatry for this, I discussed the risk of taking more of her ativan than is prescribed and I advised she discuss this with her psychiatrist, I am not able to fill this for her at this time.

## 2022-07-01 LAB — HEMOGLOBIN A1C: Hgb A1c MFr Bld: 6.4 % (ref 4.6–6.5)

## 2022-07-02 ENCOUNTER — Telehealth: Payer: Self-pay | Admitting: Pulmonary Disease

## 2022-07-02 MED ORDER — LEVALBUTEROL TARTRATE 45 MCG/ACT IN AERO: 1.0000 | INHALATION_SPRAY | Freq: Four times a day (QID) | RESPIRATORY_TRACT | 11 refills | Status: DC | PRN

## 2022-07-02 NOTE — Telephone Encounter (Signed)
Called and spoke with patient. Patient stated she needed a prescription for her emergency inhaler.  Patient stated she was told insurance would not cover Ventolin. Patient  stated she never received Xopenex prescription sent to [facility].  Patient stated she would like Xopenex prescription to be sent to [facility].  Xopenex prescription sent to requested pharmacy. Advised patient to call office if for any issues.

## 2022-07-04 NOTE — Progress Notes (Signed)
A1C is increasing from last year, I would have her try reducing sugar and starches in her diet. It may also be from the seroquel, if it is not better at her next visit we may consider medication to control her blood sugars

## 2022-07-07 HISTORY — PX: COLONOSCOPY: SHX174

## 2022-07-11 ENCOUNTER — Other Ambulatory Visit: Payer: Self-pay | Admitting: Orthopaedic Surgery

## 2022-07-11 ENCOUNTER — Encounter: Payer: Self-pay | Admitting: Orthopaedic Surgery

## 2022-07-11 ENCOUNTER — Telehealth: Payer: Self-pay

## 2022-07-11 ENCOUNTER — Ambulatory Visit: Payer: Medicare Other | Attending: Rheumatology | Admitting: *Deleted

## 2022-07-11 DIAGNOSIS — M45 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: Secondary | ICD-10-CM | POA: Diagnosis present

## 2022-07-11 DIAGNOSIS — Z79899 Other long term (current) drug therapy: Secondary | ICD-10-CM | POA: Diagnosis present

## 2022-07-11 MED ORDER — TRAMADOL HCL 50 MG PO TABS: 50.0000 mg | ORAL_TABLET | Freq: Three times a day (TID) | ORAL | 0 refills | Status: DC | PRN

## 2022-07-11 MED ORDER — CERTOLIZUMAB PEGOL 2 X 200 MG ~~LOC~~ KIT
400.0000 mg | PACK | Freq: Once | SUBCUTANEOUS | Status: AC
  Administered 2022-07-11: 400 mg via SUBCUTANEOUS

## 2022-07-11 NOTE — Progress Notes (Signed)
Subjective:   Patient presents to clinic today to receive monthly dose of Cimzia.  Patient running a fever or have signs/symptoms of infection? No  Patient currently on antibiotics for the treatment of infection? No  Patient have any upcoming invasive procedures/surgeries? Yes Per patient she is having a colonoscopy in approximately 2 weeks. Per Dr. Estanislado Pandy okay to proceed with Cimzia.   Objective: CMP     Component Value Date/Time   NA 140 04/18/2022 1519   K 4.6 04/18/2022 1519   CL 106 04/18/2022 1519   CO2 24 04/18/2022 1519   GLUCOSE 104 (H) 04/18/2022 1519   BUN 12 04/18/2022 1519   CREATININE 0.94 04/18/2022 1519   CALCIUM 9.8 04/18/2022 1519   PROT 6.6 04/18/2022 1519   ALBUMIN 4.2 10/30/2020 1227   AST 15 04/18/2022 1519   ALT 19 04/18/2022 1519   ALKPHOS 144 (H) 10/30/2020 1227   BILITOT 0.3 04/18/2022 1519   GFRNONAA >60 12/29/2021 0320   GFRNONAA 47 (L) 04/11/2021 1419   GFRAA 54 (L) 04/11/2021 1419    CBC    Component Value Date/Time   WBC 5.3 04/18/2022 1519   RBC 4.36 04/18/2022 1519   HGB 11.8 04/18/2022 1519   HCT 37.3 04/18/2022 1519   PLT 269 04/18/2022 1519   MCV 85.6 04/18/2022 1519   MCH 27.1 04/18/2022 1519   MCHC 31.6 (L) 04/18/2022 1519   RDW 13.9 04/18/2022 1519   LYMPHSABS 1,818 04/18/2022 1519   MONOABS 0.4 10/30/2020 1227   EOSABS 111 04/18/2022 1519   BASOSABS 21 04/18/2022 1519    Baseline Immunosuppressant Therapy Labs TB GOLD    Latest Ref Rng & Units 04/18/2022    3:19 PM  Quantiferon TB Gold  Quantiferon TB Gold Plus NEGATIVE NEGATIVE    Hepatitis Panel    Latest Ref Rng & Units 04/26/2020   11:26 AM  Hepatitis  Hep B Surface Ag NON-REACTI NON-REACTIVE   Hep B IgM NON-REACTI NON-REACTIVE    HIV Lab Results  Component Value Date   HIV NON-REACTIVE 04/26/2020   HIV Non Reactive 08/23/2017   HIV Non Reactive 04/14/2017   Immunoglobulins    Latest Ref Rng & Units 04/26/2020   11:26 AM  Immunoglobulin  Electrophoresis  IgA  70 - 320 mg/dL 120   IgG 600 - 1,540 mg/dL 824   IgM 50 - 300 mg/dL 47    SPEP    Latest Ref Rng & Units 04/18/2022    3:19 PM  Serum Protein Electrophoresis  Total Protein 6.1 - 8.1 g/dL 6.6    G6PD No results found for: "G6PDH" TPMT No results found for: "TPMT"   Chest x-ray: : 02/01/2022 Chronic appearing increased interstitial lung markings without evidence of acute or active cardiopulmonary disease  Assessment/Plan:   Administrations This Visit     certolizumab pegol (CIMZIA) kit 400 mg     Admin Date 07/11/2022 Action Given Dose 400 mg Route Subcutaneous Administered By Carole Binning, LPN             Patient tolerated injection well.   Appointment for next injection scheduled for August 08, 2022.  Patient due for labs in today and again in January 2024.  Patient is to call and reschedule appointment if running a fever with signs/symptoms of infection, on antibiotics for active infection or has an upcoming invasive procedure.  All questions encouraged and answered.  Instructed patient to call with any further questions or concerns.

## 2022-07-11 NOTE — Telephone Encounter (Signed)
Patient states knees are killing her, requesting something for pain

## 2022-07-12 LAB — CBC WITH DIFFERENTIAL/PLATELET
Absolute Monocytes: 315 cells/uL (ref 200–950)
Basophils Absolute: 30 cells/uL (ref 0–200)
Basophils Relative: 0.6 %
Eosinophils Absolute: 110 cells/uL (ref 15–500)
Eosinophils Relative: 2.2 %
HCT: 37.9 % (ref 35.0–45.0)
Hemoglobin: 12.2 g/dL (ref 11.7–15.5)
Lymphs Abs: 1360 cells/uL (ref 850–3900)
MCH: 28.2 pg (ref 27.0–33.0)
MCHC: 32.2 g/dL (ref 32.0–36.0)
MCV: 87.7 fL (ref 80.0–100.0)
MPV: 9.1 fL (ref 7.5–12.5)
Monocytes Relative: 6.3 %
Neutro Abs: 3185 cells/uL (ref 1500–7800)
Neutrophils Relative %: 63.7 %
Platelets: 301 10*3/uL (ref 140–400)
RBC: 4.32 10*6/uL (ref 3.80–5.10)
RDW: 14.1 % (ref 11.0–15.0)
Total Lymphocyte: 27.2 %
WBC: 5 10*3/uL (ref 3.8–10.8)

## 2022-07-12 LAB — COMPLETE METABOLIC PANEL WITH GFR
AG Ratio: 1.5 (calc) (ref 1.0–2.5)
ALT: 15 U/L (ref 6–29)
AST: 13 U/L (ref 10–35)
Albumin: 3.8 g/dL (ref 3.6–5.1)
Alkaline phosphatase (APISO): 147 U/L (ref 37–153)
BUN/Creatinine Ratio: 13 (calc) (ref 6–22)
BUN: 14 mg/dL (ref 7–25)
CO2: 23 mmol/L (ref 20–32)
Calcium: 9.3 mg/dL (ref 8.6–10.4)
Chloride: 107 mmol/L (ref 98–110)
Creat: 1.12 mg/dL — ABNORMAL HIGH (ref 0.60–1.00)
Globulin: 2.6 g/dL (calc) (ref 1.9–3.7)
Glucose, Bld: 152 mg/dL — ABNORMAL HIGH (ref 65–99)
Potassium: 4.2 mmol/L (ref 3.5–5.3)
Sodium: 139 mmol/L (ref 135–146)
Total Bilirubin: 0.3 mg/dL (ref 0.2–1.2)
Total Protein: 6.4 g/dL (ref 6.1–8.1)
eGFR: 53 mL/min/{1.73_m2} — ABNORMAL LOW (ref >=60)

## 2022-07-12 NOTE — Progress Notes (Signed)
Creatinine is mildly elevated, glucose is elevated.  CBC is normal.  Please have patient discuss results with her PCP.

## 2022-07-17 ENCOUNTER — Ambulatory Visit
Admission: RE | Admit: 2022-07-17 | Discharge: 2022-07-17 | Disposition: A | Discharge status: Home / Self Care | Payer: Medicare Other | Source: Ambulatory Visit | Attending: Acute Care | Admitting: Acute Care

## 2022-07-17 DIAGNOSIS — R911 Solitary pulmonary nodule: Secondary | ICD-10-CM

## 2022-07-17 DIAGNOSIS — Z87891 Personal history of nicotine dependence: Secondary | ICD-10-CM

## 2022-07-25 ENCOUNTER — Other Ambulatory Visit: Payer: Self-pay

## 2022-07-25 ENCOUNTER — Telehealth: Payer: Self-pay | Admitting: Acute Care

## 2022-07-25 DIAGNOSIS — J479 Bronchiectasis, uncomplicated: Secondary | ICD-10-CM

## 2022-07-25 DIAGNOSIS — Z87891 Personal history of nicotine dependence: Secondary | ICD-10-CM

## 2022-07-25 DIAGNOSIS — Z122 Encounter for screening for malignant neoplasm of respiratory organs: Secondary | ICD-10-CM

## 2022-07-25 NOTE — Telephone Encounter (Signed)
Called and spoke with patient.  Patient made aware of Dr. Matilde Bash recommendations. Sputum cups with instructions left at the front desk for patient to pick up.  Orders placed.  Nothing further at this time.

## 2022-07-25 NOTE — Telephone Encounter (Signed)
Spoke with patient by phone, using two patient identifiers, to review results of follow up LDCT.  Follow up scan has no suspicious findings for lung cancer.  Will place patient back on annual LDCT.  Area of haziness noted on scan. Patient does states she has had increased congestion the last few days.  No fatigue or fever.  States PCP has given her a prescription for prednisone to use 'as needed to head off a flare up' and she is planning on starting that this week.  Informed patient we have notified Dr. Vaughan Browner to review her scan also, in case he has further recommendations.  Patient acknowledged understanding and had no further questions.

## 2022-07-25 NOTE — Telephone Encounter (Signed)
Thanks for the update.  Lattie Haw- can you please call the patient and send sputum samples for AFB culture and regular culture.  Thank you

## 2022-07-25 NOTE — Telephone Encounter (Signed)
Judy [name], this patient is followed by Dr. Vaughan Browner. I have added him to this message so he is aware of the finding of mild bronchiolectasis and perilobular scarring in the mid to lower lungs.   Dr. Vaughan Browner, Scan was read as a LR 2, mild bronchiectasis. 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  I was not sure if when you see her next you might want a culture her sputum, but wanted to make sure that you are aware of this finding.  Denise please order 61-month low-dose CT, and fax results to PCP.  Thank so much

## 2022-07-25 NOTE — Progress Notes (Unsigned)
Office Visit Note  [name]             Date of Birth: 1951-05-28           MRN: [id number]             PCP: Farrel Conners, MD Referring: Farrel Conners, MD Visit Date: 08/08/2022 Occupation: '@GUAROCC' @  Subjective:  Yeast infection   History of Present Illness: Judy [name] is a 71 y.o. female with history of ankylosing spondylitis.  Patient is on Cimzia 400 mg sq injections every 4 weeks.  Patient presents today for her next in-office cimzia injection today, but states yesterday she noticed the yeast infection in her left armpit has returned.  Patient was treated with diflucan in June 2023 for a skin yeast infection, which resolved her symptoms.  She has been trying to wash under her armpits and under her breasts daily.  She denies any other recent or recurrent infections.  She is unsure what she should be noticing since restarting on Cimzia.  She has not noticed any clinical difference since resuming Cimzia as prescribed.  She continues to have chronic pain involving multiple joints.  Her pain has been most severe in her C-spine and lumbar spine.  She is using a cane to assist with ambulation.  She continues to have pain and stiffness in the left knee replacement.  She states that she is following up with her PCP tomorrow for further evaluation of possible restless leg syndrome.  She has been taking tramadol as needed for pain relief.  She is feeling discouraged by the level of pain and stiffness she continues to experience.  She states for the past 2 weeks she has had increased pain in both hands but denies any obvious joint swelling.    Activities of Daily Living:  Patient reports morning stiffness for 20-30 minutes.   Patient Reports nocturnal pain.  Difficulty dressing/grooming: Reports Difficulty climbing stairs: Denies Difficulty getting out of chair: Denies Difficulty using hands for taps, buttons, cutlery, and/or writing: Denies  Review of  Systems  Constitutional:  Positive for fatigue.  HENT:  Negative for mouth sores, mouth dryness and nose dryness.   Eyes:  Negative for pain, visual disturbance and dryness.  Respiratory:  Positive for shortness of breath and difficulty breathing. Negative for cough and hemoptysis.   Cardiovascular:  Negative for chest pain, palpitations, hypertension and swelling in legs/feet.  Gastrointestinal:  Negative for blood in stool, constipation and diarrhea.  Endocrine: Negative for increased urination.  Genitourinary:  Positive for difficulty urinating. Negative for painful urination.  Musculoskeletal:  Positive for joint pain, joint pain, joint swelling and morning stiffness. Negative for myalgias, muscle weakness, muscle tenderness and myalgias.  Skin:  Positive for rash and hair loss. Negative for color change, pallor, nodules/bumps, skin tightness, ulcers and sensitivity to sunlight.  Neurological:  Positive for headaches. Negative for dizziness, numbness and weakness.  Hematological:  Negative for swollen glands.  Psychiatric/Behavioral:  Positive for depressed mood and sleep disturbance. The patient is nervous/anxious.     PMFS History:  Patient Active Problem List   Diagnosis Date Noted   Status post total left knee replacement 12/28/2021   Primary osteoarthritis of left knee 10/25/2021   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 ([place]) 11/30/2020   Status post total replacement of right hip 06/04/2019   Status post total replacement of left hip 03/19/2019   PTSD (post-traumatic stress disorder)    COPD (chronic obstructive pulmonary  disease) ([place])    Bipolar 1 disorder ([place])    Anxiety    B12 deficiency 12/09/2018   Hyperglycemia 04/14/2017    Past Medical History:  Diagnosis Date   Anxiety    Arthritis    Avascular necrosis ([place])    Left hip   Avascular necrosis of bone of hip, left ([place]) 12/28/2018   Avascular necrosis of bone of right hip ([place]) 04/28/2019   Bipolar  1 disorder ([facility])    COPD (chronic obstructive pulmonary disease) ([facility])    Depression    Dyspnea    Pneumonia    PTSD (post-traumatic stress disorder)     Family History  Problem Relation Age of Onset   CAD Mother 54   COPD Mother    Depression Mother    Heart attack Father    Colon polyps Sister    Cancer Maternal Aunt        growth on face- underwent chemo   Heart attack Maternal Grandmother    Early death Maternal Grandfather    Hearing loss Paternal Grandfather    Asthma Daughter    Bipolar disorder Son    Past Surgical History:  Procedure Laterality Date   bone spurs foot Right    CATARACT EXTRACTION Bilateral    COLONOSCOPY     COLONOSCOPY  07/2022   HAND SURGERY Left    JOINT REPLACEMENT     TONSILLECTOMY     TONSILLECTOMY AND ADENOIDECTOMY     TOTAL HIP ARTHROPLASTY Left 03/19/2019   Procedure: LEFT TOTAL HIP ARTHROPLASTY ANTERIOR APPROACH;  Surgeon: Mcarthur Rossetti, MD;  Location: [facility];  Service: Orthopedics;  Laterality: Left;   TOTAL HIP ARTHROPLASTY Right 06/04/2019   Procedure: RIGHT TOTAL HIP ARTHROPLASTY ANTERIOR APPROACH;  Surgeon: Mcarthur Rossetti, MD;  Location: [facility];  Service: Orthopedics;  Laterality: Right;   TOTAL KNEE ARTHROPLASTY Left 12/28/2021   Procedure: Left TOTAL KNEE ARTHROPLASTY;  Surgeon: Mcarthur Rossetti, MD;  Location: [facility];  Service: Orthopedics;  Laterality: Left;   Social History   Social History Narrative   Not on file   Immunization History  Administered Date(s) Administered   Influenza, High Dose Seasonal PF 08/25/2018   Moderna Sars-Covid-2 Vaccination 11/08/2019, 12/05/2019, 10/09/2020   Pneumococcal Conjugate-13 09/23/2018   Pneumococcal Polysaccharide-23 08/27/2016   Tdap 10/07/2012   Yellow Fever 07/05/2008     Objective: Vital Signs: BP 117/75 (BP Location: Left Arm, Patient Position: Sitting, Cuff Size: Normal)   Pulse 88   Resp 18   Ht '5\' 10"'  (1.778 m)   Wt 220 lb (99.8 kg)   BMI  31.57 kg/m    Physical Exam Vitals and nursing note reviewed.  Constitutional:      Appearance: She is well-developed.  HENT:     Head: Normocephalic and atraumatic.  Eyes:     Conjunctiva/sclera: Conjunctivae normal.  Cardiovascular:     Rate and Rhythm: Normal rate and regular rhythm.     Heart sounds: Normal heart sounds.  Pulmonary:     Effort: Pulmonary effort is normal.     Breath sounds: Normal breath sounds.  Abdominal:     General: Bowel sounds are normal.     Palpations: Abdomen is soft.  Musculoskeletal:     Cervical back: Normal range of motion.  Skin:    General: Skin is warm and dry.     Capillary Refill: Capillary refill takes less than 2 seconds.     Comments: Left axillary candidal infection   Neurological:  Mental Status: She is alert and oriented to person, place, and time.  Psychiatric:        Behavior: Behavior normal.      Musculoskeletal Exam: Patient remained seated during the exam.  C-spine has limited ROM.  Thoracic kyphosis. Midline spinal tenderness in the lumbar region.  Shoulder joints have good ROM.  Elbow joints, wrist joints, MCPs, PIPs, and DIPs good ROM with no synovitis.  PIP and DIP thickening consistent with osteoarthritis of both hands.  Bilateral hip replacements difficult to assess in seated position.  Left knee replacement has good ROM. No tenderness or swelling of ankle joints.   CDAI Exam: CDAI Score: -- Patient Global: --; Provider Global: -- Swollen: --; Tender: -- Joint Exam 08/08/2022   No joint exam has been documented for this visit   There is currently no information documented on the homunculus. Go to the Rheumatology activity and complete the homunculus joint exam.  Investigation: No additional findings.  Imaging: XR Knee 1-2 Views Left  Result Date: 08/08/2022 2 views of the left knee show a total knee arthroplasty with no complicating features.  CT CHEST LCS NODULE F/U LOW DOSE WO CONTRAST  Result Date:  07/19/2022 CLINICAL DATA:  71 year old asymptomatic female former smoker with 42 pack-year smoking history, quit smoking 1 year prior, presents for short-term nodule follow-up. EXAM: CT CHEST WITHOUT CONTRAST FOR LUNG CANCER SCREENING NODULE FOLLOW-UP TECHNIQUE: Multidetector CT imaging of the chest was performed following the standard protocol without IV contrast. RADIATION DOSE REDUCTION: This exam was performed according to the departmental dose-optimization program which includes automated exposure control, adjustment of the mA and/or kV according to patient size and/or use of iterative reconstruction technique. COMPARISON:  04/15/2022 screening chest CT. FINDINGS: Cardiovascular: Normal heart size. No significant pericardial effusion/thickening. Left anterior descending and right coronary atherosclerosis. Atherosclerotic nonaneurysmal thoracic aorta. Normal caliber pulmonary arteries. Mediastinum/Nodes: No significant thyroid nodules. Unremarkable esophagus. No pathologically enlarged axillary, mediastinal or hilar lymph nodes, noting limited sensitivity for the detection of hilar adenopathy on this noncontrast study. Lungs/Pleura: No pneumothorax. No pleural effusion. Mild centrilobular emphysema with diffuse bronchial wall thickening. No acute consolidative airspace disease or lung masses. No significant growth of previously visualized pulmonary nodules. No new significant pulmonary nodules. Chronic patchy faint tree-in-bud type opacities at the periphery of the mid to lower lungs bilaterally with associated thin parenchymal bands and perilobular lines, with associated mild bronchiolectasis, unchanged. Upper abdomen: Diffuse hepatic steatosis. Nonobstructing 3 mm upper left renal stone. Musculoskeletal: No aggressive appearing focal osseous lesions. Marked thoracic spondylosis. Chronic moderate L1 vertebral compression fracture. IMPRESSION: 1. Lung-RADS 2, benign appearance or behavior. Continue annual  screening with low-dose chest CT without contrast in 12 months. 2. 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 3. Two-vessel coronary atherosclerosis. 4. Diffuse hepatic steatosis. 5. Nonobstructing left nephrolithiasis. 6. Chronic moderate L1 vertebral compression fracture. 7. Aortic Atherosclerosis (ICD10-I70.0) and Emphysema (ICD10-J43.9). Electronically Signed   By: Ilona Sorrel M.D.   On: 07/19/2022 10:55   Recent Labs: Lab Results  Component Value Date   WBC 5.0 07/11/2022   HGB 12.2 07/11/2022   PLT 301 07/11/2022   NA 139 07/11/2022   K 4.2 07/11/2022   CL 107 07/11/2022   CO2 23 07/11/2022   GLUCOSE 152 (H) 07/11/2022   BUN 14 07/11/2022   CREATININE 1.12 (H) 07/11/2022  BILITOT 0.3 07/11/2022   ALKPHOS 144 (H) 10/30/2020   AST 13 07/11/2022   ALT 15 07/11/2022   PROT 6.4 07/11/2022   ALBUMIN 4.2 10/30/2020   CALCIUM 9.3 07/11/2022   GFRAA 54 (L) 04/11/2021   QFTBGOLDPLUS NEGATIVE 04/18/2022    Speciality Comments: Cimzia completed loading on June 22, 2020  Procedures:  No procedures performed Allergies: Celexa [citalopram hydrobromide] and Gabapentin  ESR and CRP    Assessment / Plan:     Visit Diagnoses: 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 ([place]) - C-spine, thoracic spine, and lumbar spine fused: Patient continues to have generalized hyperalgesia and generalized joint stiffness.  Her pain is most severe in her C-spine and lumbar spine.  On exam she has midline spinal tenderness in the lumbar region but no SI joint tenderness upon palpation.  She has been experiencing increased pain and stiffness in both hands for the past 2 weeks.  No synovitis was noted on examination today.  She is currently on Cimzia 400 mg subcu as injections every 4  weeks.  She restarted Cimzia on 04/18/2022 and has not noticed any difference in her symptoms.  She remains uncertain of what she should be noticing while on Cimzia.  Discussed that Cimzia should be helping to slow the progression of the disease as well as helping with her underlying pain and inflammation.  Patient reports that she was recently on a prednisone taper and had no improvement in her joint pain and stiffness. Discussed that it will be important to differentiate active disease versus previous joint damage prior to making any medication changes. Recommend obtaining CRP and sed rate with her next lab work.  Today we had to postpone her Cimzia injection due to a skin candidal infection in the left axillary region.  A prescription for Diflucan was sent to the pharmacy.  She will notify us once the candidal infection has completely resolved and she has completed a course of Diflucan at which time she can reschedule her in-office cimzia injection.  She will follow up in 3 months or sooner if needed. - Plan: Sedimentation rate, C-reactive protein  High risk medication use - Cimzia 400 mg sq injections every 4 weeks.  Patient reinitiated Cimzia on 04/18/2022.  - Plan: CBC with Differential/Platelet, COMPLETE METABOLIC PANEL WITH GFR CBC and CMP updated on 07/11/22. Her next lab work will be due in January and every 3 months.  Standing orders for CBC and CMP updated today.   TB gold negative 04/18/22.  Discussed the importance of holding cimzia if she develops signs or symptoms of an infection and to resume once the infection has completely cleared.   DDD (degenerative disc disease), lumbar: She continues to have chronic pain and stiffness in her lower back.  She has midline spinal tenderness in the lumbar region.  No SI joint tenderness upon palpation.  She has not noticed any improvement in the pain and stiffness in her lower back since restarting Cimzia on 04/18/2022.  Spinal stenosis of lumbosacral  region: Chronic pain.  Continues to use a cane to assist with ambulation.  Chronic SI joint pain: She has no SI joint tenderness upon palpation.  Status post total replacement of both hips - Hx of avascular necrosis.  Difficult to assess range of motion in seated position.  S/P total knee arthroplasty, left - Dr. Blackman-12/28/21.  She continues to experience chronic pain in the left knee replacement.  She has full range of motion of the left knee replacement with  a small effusion.  She no longer requires formal physical therapy.  She was evaluated by Dr. Ninfa Linden today and had updated x-rays with no complicating features.  She plans on continuing to take tramadol as needed for pain relief.  Other medical conditions are listed as follows:   Bipolar 1 disorder ([place])  PTSD (post-traumatic stress disorder)  History of anxiety  B12 deficiency  Pulmonary nodules  History of COPD  Former smoker  Orders: Orders Placed This Encounter  Procedures   CBC with Differential/Platelet   COMPLETE METABOLIC PANEL WITH GFR   Sedimentation rate   C-reactive protein   Meds ordered this encounter  Medications   fluconazole (DIFLUCAN) 150 MG tablet    Sig: Take 1 tablet (150 mg total) by mouth daily.    Dispense:  5 tablet    Refill:  0    Follow-Up Instructions: Return in about 3 months (around 11/08/2022) for Ankylosing Spondylitis.   Ofilia Neas, PA-C  Note - This record has been created using Dragon software.  Chart creation errors have been sought, but may not always  have been located. Such creation errors do not reflect on  the standard of medical care.

## 2022-07-31 LAB — HM COLONOSCOPY

## 2022-08-05 ENCOUNTER — Ambulatory Visit: Payer: Medicare Other | Admitting: Physician Assistant

## 2022-08-06 ENCOUNTER — Telehealth: Payer: Self-pay | Admitting: Pharmacist

## 2022-08-06 NOTE — Chronic Care Management (AMB) (Signed)
    Chronic Care Management Pharmacy Assistant   Name: Judy [name]  MRN: [id number] DOB: November 24, 1950  08/07/2022 APPOINTMENT REMINDER  Called Shayne Alken, No answer, left message of appointment on 08/07/2022 at 3:45 via telephone visit with Jeni Salles, Pharm D. Notified to have all medications, supplements, blood pressure and/or blood sugar logs available during appointment and to return call if need to reschedule.  Care Gaps: AWV - scheduled 02/07/2023 Last BP - 110/60 on 06/27/2022 Last A1C - 6.4 on 06/27/2022 Shingrix - never done Covid - overdue Flu - due Pneumonia - postponed  Star Rating Drug: None  Any gaps in medications fill history? No  Gennie Alma [facility]  Catering manager [phone]

## 2022-08-07 ENCOUNTER — Ambulatory Visit: Payer: Medicare Other | Admitting: Pharmacist

## 2022-08-07 DIAGNOSIS — J449 Chronic obstructive pulmonary disease, unspecified: Secondary | ICD-10-CM

## 2022-08-07 DIAGNOSIS — E785 Hyperlipidemia, unspecified: Secondary | ICD-10-CM

## 2022-08-07 NOTE — Progress Notes (Signed)
Chronic Care Management Pharmacy Note  08/07/2022 Name:  Judy [name] MRN:  [id number] DOB:  1950/10/25  Summary: LDL not at goal < 100 Vitamin D is not at goal 30-100 Pt was approved for LIS  Recommendations/Changes made from today's visit: -Recommended restarting atorvastatin 40 mg daily and repeat lipid panel in 6-8 weeks -Recommend repeat vitamin D level and DEXA scan  Plan: Tolerance assessment in 2-3 weeks  COPD assessment in 1-2 months Follow up in 6 months  Subjective: Judy [name] is an 71 y.o. year old female who is a primary patient of Legrand Como, Royston Cowper, MD.  The CCM team was consulted for assistance with disease management and care coordination needs.    Engaged with patient by telephone for initial visit in response to provider referral for pharmacy case management and/or care coordination services.   Consent to Services:  The patient was given information about Chronic Care Management services, agreed to services, and gave verbal consent prior to initiation of services.  Please see initial visit note for detailed documentation.   Patient Care Team: Farrel Conners, MD as PCP - General (Family Medicine) Viona Gilmore, [facility] as Pharmacist (Pharmacist)  Recent office visits: 06/27/22 Loralyn Freshwater, MD: Patient presented for [facility] visit. Lipids extremely elevated.   02/08/2022 Micheline Rough MD: Patient was seen for COPD exacerbation and an additional issue. Started Breztri 160-9-4.8 2 puffs twice daily. Changed Prednisone to 20 mg to a taper. Decreased Quetiapine to 300 mg daily at bedtime. Discontinued Aspirin, Levofloxacin and Tizanidine. Return if symptoms worsen or fail to improve.  Recent consult visits: 07/31/22 Lorne Skeens, MD (gastroenterology): Patient presented for colon polyps 3 year recall.  07/11/22 Gwenlyn Perking, LPN (rheumatology): Patient presented for Cimza injection.   06/06/22 Leonia Reader (rheumatology): Patient presented  for ankylosing spondylitis of multiple sites follow up. Prescribed prednisone taper. Follow up in 8 months.  04/01/2022 Marshell Garfinkel MD (pulmonary) - Patient was seen for Chronic obstructive pulmonary disease, unspecified COPD type. Started Trelegy 200/62.5/25 mcg/act, 1 puff daily. Discontinued Norco and Ipratropium. Follow up in 6 months.    03/21/2022 Hazel Sams PA-C (rheumatology) - Patient was seen for 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 and additional issues. Started Fluconazole 100 mg daily. Follow up in 3 months.    02/07/2022 Jean Rosenthal MD (ortopedic) - Patient was seen for Status post total left knee replacement. Started Tizanidine 4 mg at bedtime. Discontinue Robaxin. Follow up in 6 months.   Hospital visits: Admitted to [facility] on 12/28/2021 due to primary osteoarthritis of the left knee. Discharge date was 12/31/2021.  New?Medications Started at [facility] Discharge:?? aspirin methocarbamol (ROBAXIN) oxyCODONE (Oxy IR/ROXICODONE) Medication Changes at Hospital Discharge: No medication changes Medications Discontinued at Hospital Discharge: No medications discontinued Medications that remain the same after Hospital Discharge:??  -All other medications will remain the same.     Objective:  Lab Results  Component Value Date   CREATININE 1.12 (H) 07/11/2022   BUN 14 07/11/2022   GFR 58.48 (L) 10/30/2020   EGFR 53 (L) 07/11/2022   GFRNONAA >60 12/29/2021   GFRAA 54 (L) 04/11/2021   NA 139 07/11/2022   K 4.2 07/11/2022   CALCIUM 9.3 07/11/2022   CO2 23 07/11/2022   GLUCOSE 152 (H) 07/11/2022    Lab Results  Component Value Date/Time   HGBA1C 6.4 06/27/2022 05:23 PM   HGBA1C 5.8 10/30/2020 12:27 PM   GFR 58.48 (L) 10/30/2020 12:27 PM   GFR 62.48 12/11/2018  10:23 AM    Last diabetic Eye exam: No results found for: "HMDIABEYEEXA"  Last diabetic Foot exam: No results found for: "HMDIABFOOTEX"   Lab Results  Component Value Date    CHOL 245 (H) 06/27/2022   HDL 44.20 06/27/2022   LDLCALC 108 (H) 10/30/2020   LDLDIRECT 170.0 06/27/2022   TRIG 239.0 (H) 06/27/2022   CHOLHDL 6 06/27/2022       Latest Ref Rng & Units 07/11/2022    2:08 PM 04/18/2022    3:19 PM 02/01/2022    4:25 PM  Hepatic Function  Total Protein 6.1 - 8.1 g/dL 6.4  6.6  7.1   AST 10 - 35 U/L _0 ALT 6 - 29 U/L _1 Total Bilirubin 0.2 - 1.2 mg/dL 0.3  0.3  0.4     Lab Results  Component Value Date/Time   TSH 1.69 02/01/2022 04:25 PM   TSH 1.28 10/30/2020 12:27 PM   FREET4 0.76 04/21/2017 02:27 PM       Latest Ref Rng & Units 07/11/2022    2:08 PM 04/18/2022    3:19 PM 02/01/2022    4:25 PM  CBC  WBC 3.8 - 10.8 Thousand/uL 5.0  5.3  10.4   Hemoglobin 11.7 - 15.5 g/dL 12.2  11.8  11.0   Hematocrit 35.0 - 45.0 % 37.9  37.3  34.2   Platelets 140 - 400 Thousand/uL 301  269  431     Lab Results  Component Value Date/Time   VD25OH 23.25 (L) 01/15/2021 01:30 PM   VD25OH 12.39 (L) 10/30/2020 12:27 PM    Clinical ASCVD: No  The 10-year ASCVD risk score (Arnett DK, et al., 2019) is: 11.7%   Values used to calculate the score:     Age: 71 years     Sex: Female     Is Non-Hispanic African American: No     Diabetic: No     Tobacco smoker: No     Systolic Blood Pressure: 939 mmHg     Is BP treated: No     HDL Cholesterol: 44.2 mg/dL     Total Cholesterol: 245 mg/dL       06/27/2022    1:39 PM 02/05/2022    3:00 PM 02/01/2022    3:23 PM  Depression screen PHQ 2/9  Decreased Interest 2 0 0  Down, Depressed, Hopeless 2 0 0  PHQ - 2 Score 4 0 0  Altered sleeping 3 0 0  Tired, decreased energy 3 0 0  Change in appetite 3 0 0  Feeling bad or failure about yourself  1 0 0  Trouble concentrating 2 0 0  Moving slowly or fidgety/restless 2 0 0  Suicidal thoughts 0 0 0  PHQ-9 Score 18 0 0  Difficult doing work/chores Very difficult           No data to display              No data to display             Social History   Tobacco Use  Smoking Status Former   Packs/day: 1.25   Years: 43.00   Total pack years: 53.75   Types: Cigarettes   Quit date: 05/19/2020   Years since quitting: 2.2   Passive exposure: Never  Smokeless Tobacco Never   BP Readings from Last 3 Encounters:  06/27/22 110/60  06/13/22 106/70  06/06/22 127/88  Pulse Readings from Last 3 Encounters:  06/27/22 88  06/13/22 76  06/06/22 74   Wt Readings from Last 3 Encounters:  06/27/22 219 lb 3.2 oz (99.4 kg)  06/06/22 215 lb (97.5 kg)  04/01/22 208 lb 3.2 oz (94.4 kg)   BMI Readings from Last 3 Encounters:  06/27/22 31.45 kg/m  06/06/22 30.85 kg/m  04/01/22 29.87 kg/m    Assessment/Interventions: Review of patient past medical history, allergies, medications, health status, including review of consultants reports, laboratory and other test data, was performed as part of comprehensive evaluation and provision of chronic care management services.   SDOH:  (Social Determinants of Health) assessments and interventions performed: Yes (last 02/20/22) SDOH Interventions    Flowsheet Row Chronic Care Management from 02/20/2022 in [place] at [place] from 02/05/2022 in [place] at [place] Interventions -- Intervention Not Indicated  Housing Interventions -- Intervention Not Indicated  Transportation Interventions Intervention Not Indicated Intervention Not Indicated  Financial Strain Interventions Other (Comment)  [working on patient assistance and medicare extra help application] Intervention Not Indicated  Physical Activity Interventions -- Intervention Not Indicated  Stress Interventions -- Intervention Not Indicated  Social Connections Interventions -- Intervention Not Indicated      SDOH Screenings   Food Insecurity: No Food Insecurity (02/05/2022)  Housing: Low Risk  (02/05/2022)  Transportation Needs: No  Transportation Needs (02/20/2022)  Alcohol Screen: Low Risk  (02/05/2022)  Depression (PHQ2-9): High Risk (06/27/2022)  Financial Resource Strain: Medium Risk (02/20/2022)  Physical Activity: Inactive (02/05/2022)  Social Connections: Moderately Integrated (02/05/2022)  Stress: No Stress Concern Present (02/05/2022)  Tobacco Use: Medium Risk (06/27/2022)    CCM Care Plan  Allergies  Allergen Reactions   Celexa [Citalopram Hydrobromide]     "goes weird"   Gabapentin     "goes weird"  Other reaction(s): hall    Medications Reviewed Today     Reviewed by Viona Gilmore, [place] (Pharmacist) on 08/07/22 at 1609  Med List Status: <None>   Medication Order Taking? Sig Documenting Provider Last Dose Status Informant  albuterol (PROVENTIL) (2.5 MG/3ML) 0.083% nebulizer solution [id number] Yes Take 3 mLs (2.5 mg total) by nebulization every 6 (six) hours as needed for wheezing or shortness of breath. Caren Macadam, MD Taking Active   Calcium-Phosphorus-Vitamin D (CITRACAL +D3 PO) [id number] Yes Take 1 tablet by mouth daily. Calcium 650 mg & vitamin D 1000 units [provider] Taking Active   Certolizumab Pegol [facility]) [id number] Yes Inject into the skin every 28 (twenty-eight) days. [provider] Taking Active   Cholecalciferol (VITAMIN D3) 50 MCG (2000 UT) TABS [id number] Yes Take 1 tablet by mouth in the morning. [provider] Taking Active Self  Cyanocobalamin (VITAMIN B-12 PO) [id number] Yes Take 2,500 mcg by mouth in the morning. [provider] Taking Active Self  Fluticasone-Umeclidin-Vilant (TRELEGY ELLIPTA) 100-62.5-25 MCG/ACT AEPB [id number]  Inhale 1 puff into the lungs daily. Marshell Garfinkel, MD  Active   levalbuterol [facility] HFA) 45 MCG/ACT inhaler [id number] Yes Inhale 1 puff into the lungs every 6 (six) hours as needed for wheezing or shortness of breath. Marshell Garfinkel, MD Taking Active   LORazepam (ATIVAN) 1 MG tablet [id number] Yes Take 1 mg  by mouth 3 (three) times daily. [provider] Taking Active   QUEtiapine (SEROQUEL) 400 MG tablet [id number] Yes Take 400 mg by mouth at bedtime. [provider] Taking Active   traMADol (ULTRAM) 50 MG tablet [id number]  Yes Take 1-2 tablets (50-100 mg total) by mouth 3 (three) times daily as needed. Mcarthur Rossetti, MD Taking Active             Patient Active Problem List   Diagnosis Date Noted   Status post total left knee replacement 12/28/2021   Primary osteoarthritis of left knee 10/25/2021   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 ([place]) 11/30/2020   Status post total replacement of right hip 06/04/2019   Status post total replacement of left hip 03/19/2019   PTSD (post-traumatic stress disorder)    COPD (chronic obstructive pulmonary disease) ([place])    Bipolar 1 disorder ([place])    Anxiety    B12 deficiency 12/09/2018   Hyperglycemia 04/14/2017    Immunization History  Administered Date(s) Administered   Influenza, High Dose Seasonal PF 08/25/2018   Moderna Sars-Covid-2 Vaccination 11/08/2019, 12/05/2019, 10/09/2020   Pneumococcal Conjugate-13 09/23/2018   Pneumococcal Polysaccharide-23 08/27/2016   Tdap 10/07/2012   Yellow Fever 07/05/2008    Patient reports her knees are going to come out from under her and has done PT before and it didn't seem to help.  She is seeing Dr. Ninfa Linden tomorrow and hopes that he will be able to help. She does not want to do PT again.  Patient is also seeing Dr. Legrand Como on Friday for restless legs. She had this problem years ago and it went away but it has come back.  She drinks tea constantly and she is not sure if it's decaf or not.  Patient had a DEXA scan ordered but never scheduled it. Provided [facility] for [facility] to schedule the appt.  Patient does not recall getting her lab work back or discussing it post recent visit with PCP. Discussed cholesterol results and inquired about previous  use of atorvastatin. Patient stopped this when she stopped all of her other medications but did not have a particular reason for stopping. She denies any side effects.  Conditions to be addressed/monitored:  Hyperlipidemia, COPD, Anxiety, Osteopenia, Osteoarthritis, and PTSD, Bipolar 1 disorder  Conditions addressed this visit: COPD, osteopenia, hyperlipidemia  Care Plan : CCM Pharmacy Care Plan  Updates made by Viona Gilmore, [place] since 08/07/2022 12:00 AM     Problem: Problem: COPD, Anxiety, Osteoarthritis, and PTSD, Bipolar 1 disorder      Long-Range Goal: Patient-Specific Goal   Start Date: 02/20/2022  Expected End Date: 02/21/2023  Recent Progress: On track  Priority: High  Note:   Current Barriers:  Unable to achieve control of cholesterol   Pharmacist Clinical Goal(s):  Patient will verbalize ability to afford treatment regimen achieve adherence to monitoring guidelines and medication adherence to achieve therapeutic efficacy achieve control of cholesterol as evidenced by next lipid panel  through collaboration with PharmD and provider.   Interventions: 1:1 collaboration with Farrel Conners, MD regarding development and update of comprehensive plan of care as evidenced by provider attestation and co-signature Inter-disciplinary care team collaboration (see longitudinal plan of care) Comprehensive medication review performed; medication list updated in electronic medical record  Hyperlipidemia: (LDL goal < 100) -Uncontrolled -Current treatment: No medications -Medications previously tried: atorvastatin (stopped on her own)  -Current dietary patterns: did not discuss -Current exercise habits: minimal due to mobility -Educated on Cholesterol goals;  Benefits of statin for ASCVD risk reduction; -Recommended restarting atorvastatin 40 mg daily.  COPD (Goal: control symptoms and prevent exacerbations) -Controlled -Current treatment  Trelegy Ellipta 1 puff daily -  Appropriate, Effective, Safe, Accessible Albuterol  nebulizer 3 mLs as needed - Appropriate, Effective, Safe, Accessible Ipratropium nebulizer 0.02% 2.5 mLs every 4 hours as needed - Appropriate, Effective, Safe, Accessible Albuterol HFA as needed  - Appropriate, Effective, Safe, Accessible -Medications previously tried: Librarian, academic (samples) -Gold Grade: Gold 1 (FEV1>80%) -Current COPD Classification:  C (low sx, >/=2 exacerbations/yr) -MMRC/CAT score: n/a -Pulmonary function testing: 2019 -Exacerbations requiring treatment in last 6 months: yes -Patient reports consistent use of maintenance inhaler -Frequency of rescue inhaler use: not using right now -Counseled on Benefits of consistent maintenance inhaler use When to use rescue inhaler -Recommended to continue current medication  Bipolar 1 disorder/anxiety/PTSD (Goal: minimize symptoms) -Controlled -Current treatment: Quetiapine 400 mg 1 tablet at bedtime - Appropriate, Effective, Safe, Accessible Lorazepam 1 mg 1 tablet as needed - Appropriate, Effective, Safe, Accessible -Medications previously tried/failed: several medications -PHQ9: 0 -GAD7: n/a -Educated on Benefits of medication for symptom control Benefits of cognitive-behavioral therapy with or without medication -Recommended to continue current medication  Osteopenia (Goal prevent fractures) -Uncontrolled -Last DEXA Scan: 10/05/2018   T-Score femoral neck: -1.3  T-Score total hip: n/a  T-Score lumbar spine: 1.3  T-Score forearm radius: n/a  10-year probability of major osteoporotic fracture: 8.6%  10-year probability of hip fracture: 0.9% -Patient is not a candidate for pharmacologic treatment -Current treatment  Vitamin D 2000 units 1 tablet daily - Appropriate, Effective, Safe, Accessible Calcium citrate 650 mg with vitamin D 1000 units - Appropriate, Effective, Safe, Accessible -Medications previously tried: none  -Recommend [phone] units of vitamin D daily.  Recommend 1200 mg of calcium daily from dietary and supplemental sources. Recommend weight-bearing and muscle strengthening exercises for building and maintaining bone density. -Recommended repeat DEXA and vitamin D level.  Vitamin B12 deficiency (Goal: 211-911) -Controlled -Current treatment  Vitamin B12 2500 mcg 1 tablet daily - Appropriate, Effective, Safe, Accessible -Medications previously tried: none  -Recommended to continue current medication  Health Maintenance -Vaccine gaps: shingrix, Prevnar20 or Pneumovax, influenza, COVID booster -Current therapy:  No medications -Educated on Cost vs benefit of each product must be carefully weighed by individual consumer -Patient is satisfied with current therapy and denies issues -Recommended to continue as is.  Patient Goals/Self-Care Activities Patient will:  - take medications as prescribed as evidenced by patient report and record review collaborate with provider on medication access solutions  Follow Up Plan: The care management team will reach out to the patient again over the next 14 days.        Medication Assistance:  Enrolled in LIS  Compliance/Adherence/Medication fill history: Care Gaps: COVID booster, shingrix, Prevnar20, influenza Last BP - 110/60 on 06/27/2022 Last A1C - 6.4 on 06/27/2022  Star-Rating Drugs: None  Patient's preferred pharmacy is:  [place], [place] [address] [place] [address] Phone: [phone] Fax: [place] [address] [place] - [address] [place] [place] [address] Phone: [phone] Fax: [phone]  Uses pill box? No - not needed Pt endorses 99% compliance  We discussed: Current pharmacy is preferred with insurance plan and patient is satisfied with pharmacy services Patient decided to: Continue current medication management strategy  Care Plan and Follow  Up Patient Decision:  Patient agrees to Care Plan and Follow-up.  Plan: The care management team will reach out to the patient again over the next 14 days.  Jeni Salles, PharmD, [place] Pharmacist [place] at [place]

## 2022-08-08 ENCOUNTER — Ambulatory Visit (INDEPENDENT_AMBULATORY_CARE_PROVIDER_SITE_OTHER): Payer: Medicare Other | Admitting: Orthopaedic Surgery

## 2022-08-08 ENCOUNTER — Ambulatory Visit (INDEPENDENT_AMBULATORY_CARE_PROVIDER_SITE_OTHER): Payer: Medicare Other

## 2022-08-08 ENCOUNTER — Encounter: Payer: Self-pay | Admitting: Orthopaedic Surgery

## 2022-08-08 ENCOUNTER — Ambulatory Visit: Payer: Medicare Other | Attending: Physician Assistant | Admitting: Physician Assistant

## 2022-08-08 ENCOUNTER — Encounter: Payer: Self-pay | Admitting: Physician Assistant

## 2022-08-08 VITALS — BP 117/75 | HR 88 | Resp 18 | Ht 70.0 in | Wt 220.0 lb

## 2022-08-08 DIAGNOSIS — E538 Deficiency of other specified B group vitamins: Secondary | ICD-10-CM

## 2022-08-08 DIAGNOSIS — Z96652 Presence of left artificial knee joint: Secondary | ICD-10-CM

## 2022-08-08 DIAGNOSIS — M5136 Other intervertebral disc degeneration, lumbar region: Secondary | ICD-10-CM | POA: Insufficient documentation

## 2022-08-08 DIAGNOSIS — Z79899 Other long term (current) drug therapy: Secondary | ICD-10-CM | POA: Diagnosis present

## 2022-08-08 DIAGNOSIS — M533 Sacrococcygeal disorders, not elsewhere classified: Secondary | ICD-10-CM | POA: Diagnosis present

## 2022-08-08 DIAGNOSIS — F319 Bipolar disorder, unspecified: Secondary | ICD-10-CM

## 2022-08-08 DIAGNOSIS — Z96643 Presence of artificial hip joint, bilateral: Secondary | ICD-10-CM | POA: Diagnosis present

## 2022-08-08 DIAGNOSIS — Z8709 Personal history of other diseases of the respiratory system: Secondary | ICD-10-CM

## 2022-08-08 DIAGNOSIS — R918 Other nonspecific abnormal finding of lung field: Secondary | ICD-10-CM | POA: Diagnosis present

## 2022-08-08 DIAGNOSIS — M4807 Spinal stenosis, lumbosacral region: Secondary | ICD-10-CM | POA: Diagnosis present

## 2022-08-08 DIAGNOSIS — F4310 Post-traumatic stress disorder, unspecified: Secondary | ICD-10-CM | POA: Diagnosis present

## 2022-08-08 DIAGNOSIS — Z8659 Personal history of other mental and behavioral disorders: Secondary | ICD-10-CM | POA: Diagnosis present

## 2022-08-08 DIAGNOSIS — G8929 Other chronic pain: Secondary | ICD-10-CM | POA: Insufficient documentation

## 2022-08-08 DIAGNOSIS — M45 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: Secondary | ICD-10-CM | POA: Insufficient documentation

## 2022-08-08 DIAGNOSIS — Z87891 Personal history of nicotine dependence: Secondary | ICD-10-CM | POA: Diagnosis present

## 2022-08-08 MED ORDER — FLUCONAZOLE 150 MG PO TABS: 150.0000 mg | ORAL_TABLET | Freq: Every day | ORAL | 0 refills | Status: DC

## 2022-08-08 MED ORDER — TRAMADOL HCL 50 MG PO TABS: 50.0000 mg | ORAL_TABLET | Freq: Three times a day (TID) | ORAL | 0 refills | Status: DC | PRN

## 2022-08-08 NOTE — Progress Notes (Signed)
The patient is now 7 months status post a left total knee arthroplasty.  She still needed ambulate with a cane.  She is physical therapy and still having some knee swelling and lateral pain.  On exam her range of motion is full with her left knee and it feels ligamentously stable.  There is a mild effusion.  2 views of the left knee show well-seated total knee arthroplasty with no complicating features.  I we will still have her exercise on her own with that knee and only get rid of the cane when she is fully comfortable.  The knee feels stable on my exam and hopefully this will continue to improve as time goes by.  I will send in some more tramadol per her request.  The next time I need to see is not for 5 months.  We will have a repeat AP and lateral of her left knee at that visit.

## 2022-08-08 NOTE — Patient Instructions (Signed)
Standing Labs We placed an order today for your standing lab work.   Please have your standing labs drawn in January and every 3 months   Please have your labs drawn 2 weeks prior to your appointment so that the provider can discuss your lab results at your appointment.  Please note that you may see your imaging and lab results in MyChart before we have reviewed them. We will contact you once all results are reviewed. Please allow our office up to 72 hours to thoroughly review all of the results before contacting the office for clarification of your results.  Lab hours are:   Monday through Thursday from 8:00 am -12:30 pm and 1:00 pm-5:00 pm and Friday from 8:00 am-12:00 pm.  Please be advised, all patients with office appointments requiring lab work will take precedent over walk-in lab work.   Labs are drawn by [facility]. Please bring your co-pay at the time of your lab draw.  You may receive a bill from [facility] for your lab work.  Please note if you are on Hydroxychloroquine and and an order has been placed for a Hydroxychloroquine level, you will need to have it drawn 4 hours or more after your last dose.  If you wish to have your labs drawn at another location, please call the office 24 hours in advance so we can fax the orders.  The office is located at [address], [address], [place], [place] [address] No appointment is necessary.    If you have any questions regarding directions or hours of operation,  please call [phone].   As a reminder, please drink plenty of water prior to coming for your lab work. Thanks!  If you have signs or symptoms of an infection or start antibiotics: First, call your PCP for workup of your infection. Hold your medication through the infection, until you complete your antibiotics, and until symptoms resolve if you take the following: Injectable medication (Actemra, Benlysta, Cimzia, Cosentyx, Enbrel, Humira, Kevzara, Orencia, Remicade, Simponi,  Stelara, Taltz, Tremfya) Methotrexate Leflunomide (Arava) Mycophenolate (Cellcept) Xeljanz, Olumiant, or Rinvoq   Vaccines You are taking a medication(s) that can suppress your immune system.  The following immunizations are recommended: Flu annually Covid-19  Td/Tdap (tetanus, diphtheria, pertussis) every 10 years Pneumonia (Prevnar 15 then Pneumovax 23 at least 1 year apart.  Alternatively, can take Prevnar 20 without needing additional dose) Shingrix: 2 doses from 4 weeks to 6 months apart  Please check with your PCP to make sure you are up to date.   

## 2022-08-09 ENCOUNTER — Encounter: Payer: Self-pay | Admitting: Family Medicine

## 2022-08-09 ENCOUNTER — Ambulatory Visit (INDEPENDENT_AMBULATORY_CARE_PROVIDER_SITE_OTHER): Payer: Medicare Other | Admitting: Family Medicine

## 2022-08-09 VITALS — BP 120/70 | HR 58 | Ht 70.0 in | Wt 220.0 lb

## 2022-08-09 DIAGNOSIS — G2581 Restless legs syndrome: Secondary | ICD-10-CM | POA: Diagnosis not present

## 2022-08-09 DIAGNOSIS — J449 Chronic obstructive pulmonary disease, unspecified: Secondary | ICD-10-CM

## 2022-08-09 DIAGNOSIS — E119 Type 2 diabetes mellitus without complications: Secondary | ICD-10-CM | POA: Insufficient documentation

## 2022-08-09 DIAGNOSIS — R7303 Prediabetes: Secondary | ICD-10-CM | POA: Insufficient documentation

## 2022-08-09 MED ORDER — PRAMIPEXOLE DIHYDROCHLORIDE 0.125 MG PO TABS: 0.1250 mg | ORAL_TABLET | Freq: Every day | ORAL | 0 refills | Status: DC

## 2022-08-09 NOTE — Progress Notes (Signed)
   Established Patient Office Visit  Subjective   Patient ID: Judy [name], female    DOB: August 31, 1951  Age: 71 y.o. MRN: [id number]  Chief Complaint  Patient presents with   restless leg    Patient states that her legs feel like she has to constantly move them. Patient states that she feels like it is happening during the day as well as nighttime. States that she was diagnosed with restless leg several years ago. States it was found during sleep study. States that she was treated with medication but then it seems to have went away. She reports a history of chronic back pain as well.   Patient as been on seroquel for years. We discussed the difference between extrapyramidal symptoms and restless leg syndrome. Her story is a bit mixed with symptoms of both conditions.       Review of Systems  All other systems reviewed and are negative.     Objective:     BP 120/70   Pulse (!) 58   Ht 5\' 10"  (1.778 m)   Wt 220 lb (99.8 kg)   SpO2 97%   BMI 31.57 kg/m    Physical Exam Vitals reviewed.  Constitutional:      Appearance: Normal appearance. She is well-groomed and normal weight.  Eyes:     Conjunctiva/sclera: Conjunctivae normal.  Neck:     Thyroid: No thyromegaly.  Cardiovascular:     Rate and Rhythm: Normal rate and regular rhythm.     Heart sounds: S1 normal and S2 normal.  Pulmonary:     Effort: Pulmonary effort is normal.     Breath sounds: Normal breath sounds and air entry.  Abdominal:     General: Bowel sounds are normal.  Musculoskeletal:     Right lower leg: No edema.     Left lower leg: No edema.  Neurological:     Mental Status: She is alert and oriented to person, place, and time. Mental status is at baseline.     Gait: Gait is intact.  Psychiatric:        Mood and Affect: Mood and affect normal.        Speech: Speech normal.        Behavior: Behavior normal.        Judgment: Judgment normal.      No results found for any visits on  08/09/22.    The 10-year ASCVD risk score (Arnett DK, et al., 2019) is: 9.3%    Assessment & Plan:   Problem List Items Addressed This Visit       Other   Restless legs syndrome - Primary    Could be this but could also be extrapyramidal symptoms from long term seroquel use. It is difficult to say which one it is because her clinical picture is somewhat mixed. I will give her a trial of mirapex at bedtime to see if this helps. If not then I advised her to discuss her symptoms with her psychiatrist.      Relevant Medications   pramipexole (MIRAPEX) 0.125 MG tablet    No follow-ups on file.    Farrel Conners, MD

## 2022-08-09 NOTE — Assessment & Plan Note (Signed)
Could be this but could also be extrapyramidal symptoms from long term seroquel use. It is difficult to say which one it is because her clinical picture is somewhat mixed. I will give her a trial of mirapex at bedtime to see if this helps. If not then I advised her to discuss her symptoms with her psychiatrist.

## 2022-08-13 ENCOUNTER — Encounter: Payer: Self-pay | Admitting: Family Medicine

## 2022-08-28 ENCOUNTER — Telehealth: Payer: Self-pay | Admitting: Pharmacist

## 2022-08-28 MED ORDER — ATORVASTATIN CALCIUM 40 MG PO TABS: 40.0000 mg | ORAL_TABLET | Freq: Every day | ORAL | 3 refills | Status: DC

## 2022-08-28 NOTE — Telephone Encounter (Signed)
Called patient to discuss restarting the atorvastatin after discussion with PCP. Patient is agreeable and atorvastatin 40 mg daily was sent to the pharmacy. Patient verbalized her understanding and was scheduled for a follow up with PCP to recheck lipid panel.  Patient wanted her PCP to be aware that she has been doubling up on the pramipexole for RLS for > 2 weeks and this hasn't helped. She wondered what the next steps would be or if she needs a higher dose. Will route to PCP.

## 2022-09-02 NOTE — Telephone Encounter (Signed)
Spoke with the patient and informed of the instructions as below.

## 2022-09-02 NOTE — Telephone Encounter (Signed)
I would have her go up to 3 tablets at bedtime.

## 2022-09-03 NOTE — Addendum Note (Signed)
Addended by: Karie Georges on: 09/03/2022 10:32 AM   Modules accepted: Orders

## 2022-09-06 ENCOUNTER — Telehealth: Payer: Self-pay | Admitting: Pharmacist

## 2022-09-06 NOTE — Telephone Encounter (Signed)
Submitted request for reverification of benefits in [employer] Portal for Cimzia in-office injections for 2024.    Left VM for patient requesting return call on any anticipated insurance changes   Chesley Mires, PharmD, MPH, BCPS, CPP Clinical Pharmacist (Rheumatology and Pulmonology)

## 2022-09-09 ENCOUNTER — Telehealth: Payer: Self-pay | Admitting: Family Medicine

## 2022-09-09 NOTE — Telephone Encounter (Signed)
Pt called to say she is taking all 3 meds for RLS and none of them seem to be helping.  Pt is asking for an alternative, because she is unable to sleep.  [facility] [address], [place] - [address] Phone: [phone]  Fax: [phone]   LOV:  08/09/22

## 2022-09-10 ENCOUNTER — Other Ambulatory Visit: Payer: Self-pay | Admitting: Family Medicine

## 2022-09-10 DIAGNOSIS — G2581 Restless legs syndrome: Secondary | ICD-10-CM

## 2022-09-10 MED ORDER — ROPINIROLE HCL 0.5 MG PO TABS: 0.5000 mg | ORAL_TABLET | Freq: Every day | ORAL | 0 refills | Status: DC

## 2022-09-10 NOTE — Telephone Encounter (Signed)
I called in ropinirole, she will start with 1/2 tablet 1-3 hours before bedtime for the first week then go to 1 whole tablet 1-3 hours before bedtime after that.

## 2022-09-10 NOTE — Telephone Encounter (Signed)
Patient informed of the message below.  Message sent to PCP as patient questioned if she should continue taking Pramipexole also?

## 2022-09-10 NOTE — Telephone Encounter (Signed)
Spoke with patient and she confirms she will not have any insurance changes for 2024. Will have Medicare A/B and [employer] supplemental plan. Cimzia re-verification of benefits submitted.  Chesley Mires, PharmD, MPH, BCPS, CPP Clinical Pharmacist (Rheumatology and Pulmonology)

## 2022-09-10 NOTE — Telephone Encounter (Signed)
No she should stop the mirapex

## 2022-09-10 NOTE — Telephone Encounter (Signed)
Patient informed of the message below.

## 2022-09-13 ENCOUNTER — Encounter: Payer: Self-pay | Admitting: Pulmonary Disease

## 2022-09-13 ENCOUNTER — Ambulatory Visit (INDEPENDENT_AMBULATORY_CARE_PROVIDER_SITE_OTHER): Payer: Medicare Other | Admitting: Pulmonary Disease

## 2022-09-13 VITALS — BP 122/62 | HR 81 | Temp 98.0°F | Ht 70.0 in | Wt 220.0 lb

## 2022-09-13 DIAGNOSIS — J449 Chronic obstructive pulmonary disease, unspecified: Secondary | ICD-10-CM

## 2022-09-13 MED ORDER — TRELEGY ELLIPTA 100-62.5-25 MCG/ACT IN AEPB: 1.0000 | INHALATION_SPRAY | Freq: Every day | RESPIRATORY_TRACT | 3 refills | Status: DC

## 2022-09-13 NOTE — Patient Instructions (Signed)
I am glad you are doing well with your breathing Continue the Trelegy inhaler Follow-up in 6 months 

## 2022-09-13 NOTE — Addendum Note (Signed)
Addended by: Jacquiline Doe on: 09/13/2022 04:12 PM   Modules accepted: Orders

## 2022-09-13 NOTE — Progress Notes (Signed)
Judy [name]    [id number]    May 05, 1951  Primary Care Physician:Michael, [name], MD  Referring Physician: Karie Georges, MD [address] [place],  [place] [address]  Chief complaint: Follow up for COPD  HPI: 71 year old with anxiety, PTSD, bipolar, COPD, asthma  Complains of dyspnea on exertion for the past 1 year.  She has symptoms on exertion, mild symptoms at rest, daily cough with clear mucus, sinus drainage.  She has had multiple hospitalizations over the past year for COPD exacerbations.  Given inhalers including Advair and Symbicort but does not feel it is helping.  Previously evaluated by pulmonary at [facility] and is here for second opinion.  Hospitalized in July 2018 for COPD exacerbation Hospitalized in November 2018 for COPD exacerbation, CT during both admissions were is negative for PE but showed bilateral groundglass opacities.  She was treated with antibiotics, oral prednisone.  Virus panel was positive for adenovirus at that time.  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  She did have a COPD exacerbation from viral illness while in rehab and was treated with antibiotics and prednisone.  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 Smoking history: 50-pack-year smoker.  Quit smoking in early 2022 Travel history: Originally  from [place].  No significant recent travel Relevant family history: Daughter has asthma.  No other significant family history of lung disease.  Interim history: She is getting samples of Trelegy as she cannot afford insurance.  She also tried breztri samples but prefers trelegy over breztri   Outpatient Encounter Medications as of 09/13/2022  Medication Sig   atorvastatin (LIPITOR) 40 MG tablet Take 1 tablet (40 mg total) by mouth  daily.   Calcium-Phosphorus-Vitamin D (CITRACAL +D3 PO) Take 1 tablet by mouth daily. Calcium 650 mg & vitamin D 1000 units   Certolizumab Pegol (CIMZIA [place]) Inject into the skin every 28 (twenty-eight) days.   Cholecalciferol (VITAMIN D3) 50 MCG (2000 UT) TABS Take 1 tablet by mouth in the morning.   Cyanocobalamin (VITAMIN B-12 PO) Take 2,500 mcg by mouth in the morning.   Fluticasone-Umeclidin-Vilant (TRELEGY ELLIPTA) 100-62.5-25 MCG/ACT AEPB Inhale 1 puff into the lungs daily.   LORazepam (ATIVAN) 1 MG tablet Take 1 mg by mouth as needed.   QUEtiapine (SEROQUEL) 400 MG tablet Take 400 mg by mouth at bedtime.   rOPINIRole (REQUIP) 0.5 MG tablet Take 1 tablet (0.5 mg total) by mouth at bedtime. Start with 1/2 tablet 1-3 hours before bedtime for the first week, then increase to 1 tablet daily 1-3 hours before bedtime.   albuterol (PROVENTIL) (2.5 MG/3ML) 0.083% nebulizer solution Take 3 mLs (2.5 mg total) by nebulization every 6 (six) hours as needed for wheezing or shortness of breath. (Patient not taking: Reported on 09/13/2022)   levalbuterol (XOPENEX HFA) 45 MCG/ACT inhaler Inhale 1 puff into the lungs every 6 (six) hours as needed for wheezing or shortness of breath. (Patient not taking: Reported on 09/13/2022)   [DISCONTINUED] fluconazole (DIFLUCAN) 150 MG tablet Take 1 tablet (150 mg total) by mouth daily.   No facility-administered encounter medications on file as of 09/13/2022.   Physical Exam: Blood pressure 120/62, pulse 95, temperature 98.3 F (36.8 C), temperature source Oral, height 5\' 10"  (1.778 m), weight 208 lb 3.2 oz (94.4  kg), SpO2 96 %. Gen:      No acute distress HEENT:  EOMI, sclera anicteric Neck:     No masses; no thyromegaly Lungs:    Clear to auscultation bilaterally; normal respiratory effort CV:         Regular rate and rhythm; no murmurs Abd:      + bowel sounds; soft, non-tender; no palpable masses, no distension Ext:    No edema; adequate peripheral  perfusion Skin:      Warm and dry; no rash Neuro: alert and oriented x 3 Psych: normal mood and affect   Data Reviewed: Imaging: CTA 04/15/2017-no pulmonary embolism, patchy upper lobe groundglass opacities CTA 08/23/2018-no PE, patchy bilateral groundglass opacities, predominantly in the bases. High-resolution CT 08/14/2018-no ILD, bronchial wall thickening with atherosclerosis, small pulmonary nodules Screening CT chest 11/01/2020-mild emphysema, stable pulmonary nodules. Chest x-ray 02/03/22 chronic appearing interstitial markings Screening CT chest 07/17/2022-mild emphysema, faint chronic patchy tree-in-bud opacities I have reviewed the images personally.  PFTs: Spirometry 05/18/18 FEV1 1.76, F/F 65.  Moderate obstruction.  08/10/18 FVC 3.31 [94%), FEV1 2.25 [83%), F/F 68, TLC 83%, DLCO 61% Mild obstruction with moderate diffusion defect.  FENO 08/05/2018-8  Labs: CBC 08/25/2017-WBC 9.6, eos 0% CBC 08/05/2018- WBC 9.1, eos 0.6%, absolute eosinophil count 55  IgE 08/10/2018-79 Alpha-1 antitrypsin 08/10/2018-192, PI MM  Assessment:  COPD PFTs reviewed with mild obstruction, no bronchodilator response.  Labs show normal IgE and low peripheral eosinophils. She has a diagnosis of asthma but this looks more like COPD.  She lacks Trelegy better than Breztri and will continue the same.  Abnormal CT, nodules Prior CTs noted with patchy bilateral groundglass opacities.  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  Follow-up high res CT does not show interstitial lung disease. She does have some mild tree-in-bud opacities suggestive of chronic MAI infection but is not able to produce sputum.  Will continue to monitor this.  Health maintenance 08/27/2016-Pneumovax Does not want flu, RSV or COVID vaccination.  Plan/Recommendations: - Continue trelegy - Low-dose screening CT  chest  Marshell Garfinkel MD [facility] 09/13/2022, 3:41 PM  CC: Farrel Conners, MD

## 2022-09-16 ENCOUNTER — Telehealth: Payer: Self-pay | Admitting: Pharmacist

## 2022-09-16 NOTE — Chronic Care Management (AMB) (Cosign Needed Addendum)
    Chronic Care Management Pharmacy Assistant   Name: Judy [name]  MRN: [id number] DOB: [birth year]  Reason for Encounter: Follow up new start of medication  Patient recently started on Atorvastatin, she states she is currently taking 40 mg 1/2 tablet daily, she is due to increase to a whole tablet today.  Patient states she is starting to have the same pains as she did with her previous statin.  She is having a shooting pain in her left leg starting from her calf to the outside of her knee, no pain in her right knee at this time.  The pain is currently intermittent and is worse at night.    Inetta Fermo [facility]  Clinical Pharmacist Assistant [phone]  Refer to next note as patient denied the above information in relation to her statin. She thought this problem was coming from her ropinirole.  Gaylord Shih, PharmD, [facility] Clinical Pharmacist [facility] [place] [phone]

## 2022-09-20 ENCOUNTER — Telehealth: Payer: Self-pay

## 2022-09-20 NOTE — Telephone Encounter (Signed)
I usually try a lower potency statin like pravastatin or lovastatin in a low dose if she is having muscle pain. I can call this in for her if she agrees to try a different one.

## 2022-09-20 NOTE — Progress Notes (Signed)
  Chronic Care Management   Note  09/20/2022 Name: Judy [name] MRN: [id number] DOB: [birth year]  Judy [name] is a 71 y.o. year old female who is a primary care patient of Karie Georges, MD. I reached out to Judy [name] by phone today in response to a referral sent by Judy [name] PCP.  Judy [name]  agreedto scheduling an appointment with the CCM RN Case Manager   Follow up plan: Patient agreed to scheduled appointment with RN Case Manager on 10/17/2022(date/time).   Penne Lash, RMA Care Guide [facility]  [place], [place] [address] Direct Dial: [phone] [email or web address]

## 2022-10-02 ENCOUNTER — Telehealth: Payer: Self-pay | Admitting: Family Medicine

## 2022-10-02 NOTE — Telephone Encounter (Signed)
Pt requesting a prescription for her rattling cough, congestion x 2d. Denies OV

## 2022-10-02 NOTE — Telephone Encounter (Signed)
Spoke with the patient and informed her a visit is needed for evaluation.  Our office does not have any openings, I checked all of the other [place] offices and unfortunately there are no openings available.  Patient was advised to seek treatment at an urgent care and information for the new office on [address] was given to the patient.

## 2022-10-09 ENCOUNTER — Ambulatory Visit (INDEPENDENT_AMBULATORY_CARE_PROVIDER_SITE_OTHER): Payer: Medicare Other

## 2022-10-09 ENCOUNTER — Encounter: Payer: Self-pay | Admitting: Orthopaedic Surgery

## 2022-10-09 ENCOUNTER — Ambulatory Visit (INDEPENDENT_AMBULATORY_CARE_PROVIDER_SITE_OTHER): Payer: Medicare Other | Admitting: Orthopaedic Surgery

## 2022-10-09 VITALS — Ht 70.0 in | Wt 220.0 lb

## 2022-10-09 DIAGNOSIS — M545 Low back pain, unspecified: Secondary | ICD-10-CM

## 2022-10-09 DIAGNOSIS — G8929 Other chronic pain: Secondary | ICD-10-CM | POA: Diagnosis not present

## 2022-10-09 MED ORDER — HYDROCODONE-ACETAMINOPHEN 5-325 MG PO TABS: 1.0000 | ORAL_TABLET | Freq: Three times a day (TID) | ORAL | 0 refills | Status: DC | PRN

## 2022-10-09 NOTE — Progress Notes (Signed)
The patient is a 72 year old female well-known to me.  She has significant rheumatoid disease and sees a rheumatologist where she gets treatments with Remicade.  She has recently been on prednisone as well.  I have replaced both of her hips and her left knee.  She has been having worsening low back pain for the last month.  Again she was placed on a steroid and has not really helped.  Her back pain is right greater then left but there is not a significant radicular component.  Sitting and walking does aggravate her back and has been a constant pain for about 3 weeks now.  She denies any change in bowel or bladder function.  On exam she does have pain with flexion extension of her lumbar spine.  She has negative straight leg raise bilaterally but obvious significant pain in the lumbar spine.  2 views lumbar spine show bridging osteophytes at multiple levels.  There is significant arthritic changes.  There is also a chronic L1 compression fracture.  I was able to see this on previous films and it has not changed.  I would like to send her for an MRI of her lumbar spine to evaluate the degree of stenosis and nerve compression that she may have.  Will try short course of hydrocodone for her as well and see her back once we have this MRI.

## 2022-10-09 NOTE — Addendum Note (Signed)
Addended by: Meyer Cory on: 10/09/2022 05:02 PM   Modules accepted: Orders

## 2022-10-11 ENCOUNTER — Telehealth: Payer: Self-pay | Admitting: Rheumatology

## 2022-10-11 NOTE — Telephone Encounter (Signed)
Patient left a voicemail requesting to speak with Seth Bake regarding her Cimzia injection.

## 2022-10-11 NOTE — Telephone Encounter (Signed)
Patient states she has completed the course of Diflucan and would like to schedule her Cimzia. Patient scheduled for 10/17/2022 at 3:00 pm.

## 2022-10-17 ENCOUNTER — Ambulatory Visit: Payer: Medicare Other | Attending: Rheumatology | Admitting: *Deleted

## 2022-10-17 ENCOUNTER — Ambulatory Visit (INDEPENDENT_AMBULATORY_CARE_PROVIDER_SITE_OTHER): Payer: Medicare Other

## 2022-10-17 DIAGNOSIS — R7303 Prediabetes: Secondary | ICD-10-CM

## 2022-10-17 DIAGNOSIS — J441 Chronic obstructive pulmonary disease with (acute) exacerbation: Secondary | ICD-10-CM

## 2022-10-17 DIAGNOSIS — M45 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: Secondary | ICD-10-CM | POA: Diagnosis present

## 2022-10-17 DIAGNOSIS — Z79899 Other long term (current) drug therapy: Secondary | ICD-10-CM | POA: Diagnosis present

## 2022-10-17 MED ORDER — CERTOLIZUMAB PEGOL 2 X 200 MG ~~LOC~~ KIT
400.0000 mg | PACK | Freq: Once | SUBCUTANEOUS | Status: AC
  Administered 2022-10-17: 400 mg via SUBCUTANEOUS

## 2022-10-17 NOTE — Chronic Care Management (AMB) (Signed)
Chronic Care Management   CCM RN Visit Note  10/17/2022 Name: Judy [name] MRN: [id number] DOB: 1951-04-28  Subjective: Judy [name] is a 72 y.o. year old female who is a primary care patient of Farrel Conners, MD. The patient was referred to the Chronic Care Management team for assistance with care management needs subsequent to provider initiation of CCM services and plan of care.    Today's Visit:  Engaged with patient by telephone for initial visit.     SDOH Interventions Today    Flowsheet Row Most Recent Value  SDOH Interventions   Food Insecurity Interventions Intervention Not Indicated  Housing Interventions Intervention Not Indicated  Transportation Interventions Intervention Not Indicated  Utilities Interventions Intervention Not Indicated  Alcohol Usage Interventions Intervention Not Indicated (Score <7)  Financial Strain Interventions Intervention Not Indicated  Physical Activity Interventions Intervention Not Indicated  Stress Interventions Other (Comment)  [Reports d/t possible need for surgery]  Social Connections Interventions Intervention Not Indicated         Goals Addressed             This Visit's Progress    Goal: CCM (COPD) Expected Outcome:  Monitor, Self-Manage And Reduce Symptoms of COPD       Current Barriers:  Chronic Disease Management support and education needs related to COPD  Planned Interventions: Reviewed provider's plan for COPD management.  Reviewed medications and indications for use. Reports not using nebulizers. Primarily using Trelegy. Agreed to update team with concerns regarding prescription cost. Discussed triggers. Reports episodes of shortness of breath which she attributes to panic attacks r/t anxiety. Reports a cold/sinus congestion and cough over a week ago. Reports symptoms have resolved. Reports respiratory symptoms have been well controlled with inhaler. Reports monitoring oxygen saturations. Readings have  been between 96-98%. Does not require supplemental oxygen. Reviewed current action plan and importance of daily self-assessment. Advised to make an appointment if experiencing moderate symptoms for greater than 48 hours without improvement.  Provided information regarding infection prevention and increased risk r/t COPD. Advised to utilize prevention strategies to reduce risk of respiratory infection.  Reports completing follow ups with Dr. Mannam/Pulmonology every 4-6 months. Reviewed worsening symptoms that require immediate medical attention.   Symptom Management: Complete appointments with the medical team as scheduled Take medications and use inhalers as prescribed Assess symptoms daily Notify provider if in the yellow zone for 48 hrs without improvement Follow recommendations to prevent respiratory infection Notify provider or care management team with questions and new concerns as needed   Follow Up Plan:  Will follow up next month      Goal: CCM (Pre-Diabetes) Expected Outcome:  Monitor, Self-Manage And Reduce Symptoms of Pre-Diabetes       Current Barriers:  Chronic Disease Management support and education needs related to Pre-Diabetes  Planned Interventions: Reviewed provider's plan for Pre-Diabetes/Hyperglycemia.  Discussed nutritional intake. Advised to increase consumption of fruits, vegetables, and lean proteins. Advised to monitor intake of carbohydrates and avoid foods and beverages with added sugar when possible. Will send additional educational documents.  Discussed importance of engaging inconsistent exercises. Report going to the gym 3 days week Exercises for approximately 30-45 min during gym session. Reports tolerating exercises without difficulty.  Discussed importance of completing ordered labs as prescribed.  Assessed social determinant of health barriers.   Lab Results  Component Value Date   HGBA1C 6.4 06/27/2022    Symptom Management: Attend all  scheduled provider appointment Continue engaging in consistent exercise programs Read  food labels for fat, fiber, carbohydrates and portion size Complete labs as ordered Call provider office for new concerns or questions    Follow Up Plan:  Will follow up next month            PLAN: Will follow up next month   [facility] Manager/Chronic Care Management [phone]

## 2022-10-17 NOTE — Progress Notes (Signed)
Subjective:   Patient presents to clinic today to receive monthly dose of Cimzia.  Patient running a fever or have signs/symptoms of infection? No  Patient currently on antibiotics for the treatment of infection? No  Patient have any upcoming invasive procedures/surgeries? No  Objective: CMP     Component Value Date/Time   NA 139 07/11/2022 1408   K 4.2 07/11/2022 1408   CL 107 07/11/2022 1408   CO2 23 07/11/2022 1408   GLUCOSE 152 (H) 07/11/2022 1408   BUN 14 07/11/2022 1408   CREATININE 1.12 (H) 07/11/2022 1408   CALCIUM 9.3 07/11/2022 1408   PROT 6.4 07/11/2022 1408   ALBUMIN 4.2 10/30/2020 1227   AST 13 07/11/2022 1408   ALT 15 07/11/2022 1408   ALKPHOS 144 (H) 10/30/2020 1227   BILITOT 0.3 07/11/2022 1408   GFRNONAA >60 12/29/2021 0320   GFRNONAA 47 (L) 04/11/2021 1419   GFRAA 54 (L) 04/11/2021 1419    CBC    Component Value Date/Time   WBC 5.0 07/11/2022 1408   RBC 4.32 07/11/2022 1408   HGB 12.2 07/11/2022 1408   HCT 37.9 07/11/2022 1408   PLT 301 07/11/2022 1408   MCV 87.7 07/11/2022 1408   MCH 28.2 07/11/2022 1408   MCHC 32.2 07/11/2022 1408   RDW 14.1 07/11/2022 1408   LYMPHSABS 1,360 07/11/2022 1408   MONOABS 0.4 10/30/2020 1227   EOSABS 110 07/11/2022 1408   BASOSABS 30 07/11/2022 1408    Baseline Immunosuppressant Therapy Labs TB GOLD    Latest Ref Rng & Units 04/18/2022    3:19 PM  Quantiferon TB Gold  Quantiferon TB Gold Plus NEGATIVE NEGATIVE    Hepatitis Panel    Latest Ref Rng & Units 04/26/2020   11:26 AM  Hepatitis  Hep B Surface Ag NON-REACTI NON-REACTIVE   Hep B IgM NON-REACTI NON-REACTIVE    HIV Lab Results  Component Value Date   HIV NON-REACTIVE 04/26/2020   HIV Non Reactive 08/23/2017   HIV Non Reactive 04/14/2017   Immunoglobulins    Latest Ref Rng & Units 04/26/2020   11:26 AM  Immunoglobulin Electrophoresis  IgA  70 - 320 mg/dL 120   IgG 600 - 1,540 mg/dL 824   IgM 50 - 300 mg/dL 47    SPEP    Latest Ref Rng  & Units 07/11/2022    2:08 PM  Serum Protein Electrophoresis  Total Protein 6.1 - 8.1 g/dL 6.4    G6PD No results found for: "G6PDH" TPMT No results found for: "TPMT"   Chest x-ray: 02/01/2022 Chronic appearing increased interstitial lung markings without evidence of acute or active cardiopulmonary disease  Assessment/Plan:   Administrations This Visit     certolizumab pegol (CIMZIA) kit 400 mg     Admin Date 10/17/2022 Action Given Dose 400 mg Route Subcutaneous Administered By Carole Binning, LPN             Patient tolerated injection well.   Appointment for next injection scheduled for 11/14/2022.  Patient due for labs today and were drawn while in office.  Patient is to call and reschedule appointment if running a fever with signs/symptoms of infection, on antibiotics for active infection or has an upcoming invasive procedure.  All questions encouraged and answered.  Instructed patient to call with any further questions or concerns.

## 2022-10-17 NOTE — Plan of Care (Signed)
Chronic Care Management Provider Comprehensive Care Plan    10/17/2022 Name: Judy [name] MRN: [id number] DOB: May 30, 1951  Referral to Chronic Care Management (CCM) services was placed by Provider: Farrel Conners, MD on Date: 09/03/22.  Chronic Condition 1: COPD Provider Assessment and Plan   COPD exacerbation ([place]) She is doing much better than last week. O2 requirement has decreased(currently on 2.5L). encouraged her to continue to decrease as tolerated to maintain O2 sat around 92. Discussed that she may be able to stop use at rest before she can stop use with activity. She is about out of her trelegy and cannot afford this. I have given her 1 month of breztri samples and instructed on use of spacing chamber with this. I have put in referral for CCM with pharmacy to helpwith patient assistance. I sent in prednisone burst incase worsening of sx over the weekend since she just completed prednisone yesterday and is still very wheezy on exam. She will call if any worsening of sx or if she feels that she is notreturning to baseline.  Expected Outcome/Goals Addressed This Visit (Provider CCM goals/Provider Assessment and plan  Goal: CCM (COPD) Expected Outcome:  Monitor, Self-Manage And Reduce Symptoms of COPD  Symptom Management Condition 1: Complete appointments with the medical team as scheduled Take medications and use inhalers as prescribed Assess symptoms daily Notify provider if in the yellow zone for 48 hrs without improvement Follow recommendations to prevent respiratory infection Notify provider or care management team with questions and new concerns as needed     Chronic Condition 2: Pre-Diabetes Provider Assessment and Plan  Hyperglycemia - Primary       Patient's last A1C was 5.8, she did have a slight elevation in blood sugar on the last set of labs, will check new A1C today with her lipid panel.     Expected Outcome/Goals Addressed This Visit (Provider CCM  goals/Provider Assessment and plan  Goal: CCM (Pre-Diabetes) Expected Outcome:  Monitor, Self-Manage And Reduce Symptoms of Pre-Diabetes   Symptom Management Condition 2: Attend all scheduled provider appointment Continue engaging in consistent exercise programs Read food labels for fat, fiber, carbohydrates and portion size Complete labs as ordered Call provider office for new concerns or questions    Problem List 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 ([place]) 11/30/2020   Status post total replacement of right hip 06/04/2019   Status post total replacement of left hip 03/19/2019   PTSD (post-traumatic stress disorder)    COPD (chronic obstructive pulmonary disease) ([place])    Bipolar 1 disorder ([place])    Anxiety    B12 deficiency 12/09/2018   Hyperglycemia 04/14/2017    Medication Management  Current Outpatient Medications:    atorvastatin (LIPITOR) 40 MG tablet, Take 1 tablet (40 mg total) by mouth daily., Disp: 90 tablet, Rfl: 3   Biotin 10000 MCG TABS, Take by mouth., Disp: , Rfl:    Calcium-Phosphorus-Vitamin D (CITRACAL +D3 PO), Take 1 tablet by mouth daily. Calcium 650 mg & vitamin D 1000 units, Disp: , Rfl:    Cholecalciferol (VITAMIN D3) 50 MCG (2000 UT) TABS, Take 1 tablet by mouth in the morning., Disp: , Rfl:    Fluticasone-Umeclidin-Vilant (TRELEGY ELLIPTA) 100-62.5-25 MCG/ACT AEPB, Inhale 1 puff into the lungs daily., Disp: 180  each, Rfl: 3   HYDROcodone-acetaminophen (NORCO/VICODIN) 5-325 MG tablet, Take 1 tablet by mouth 3 (three) times daily as needed for moderate pain., Disp: 30 tablet, Rfl: 0   LORazepam (ATIVAN) 1 MG tablet, Take 1 mg by mouth as needed., Disp: , Rfl:    Misc Natural Products (NEURIVA) CAPS, Take by mouth., Disp: , Rfl:     QUEtiapine (SEROQUEL) 400 MG tablet, Take 400 mg by mouth at bedtime., Disp: , Rfl:    rOPINIRole (REQUIP) 0.5 MG tablet, Take 1 tablet (0.5 mg total) by mouth at bedtime. Start with 1/2 tablet 1-3 hours before bedtime for the first week, then increase to 1 tablet daily 1-3 hours before bedtime., Disp: 90 tablet, Rfl: 0   albuterol (PROVENTIL) (2.5 MG/3ML) 0.083% nebulizer solution, Take 3 mLs (2.5 mg total) by nebulization every 6 (six) hours as needed for wheezing or shortness of breath. (Patient not taking: Reported on 09/13/2022), Disp: 324 mL, Rfl: 1   Certolizumab Pegol (CIMZIA [place]), Inject into the skin every 28 (twenty-eight) days., Disp: , Rfl:    Cyanocobalamin (VITAMIN B-12 PO), Take 2,500 mcg by mouth in the morning., Disp: , Rfl:    levalbuterol (XOPENEX HFA) 45 MCG/ACT inhaler, Inhale 1 puff into the lungs every 6 (six) hours as needed for wheezing or shortness of breath. (Patient not taking: Reported on 09/13/2022), Disp: 15 g, Rfl: 11  Cognitive Assessment Identity Confirmed: : Name; DOB Cognitive Status: Normal   Functional Assessment Hearing Difficulty or Deaf: no Wear Glasses or Blind: yes Vision Management: Reading glassess as needed Concentrating, Remembering or Making Decisions Difficulty (CP): no Difficulty Communicating: no Difficulty Eating/Swallowing: no Walking or Climbing Stairs Difficulty: no Dressing/Bathing Difficulty: no Doing Errands Independently Difficulty (such as shopping) (CP): no Change in Functional Status Since Onset of Current Illness/Injury: no   Caregiver Assessment  Primary Source of Support/Comfort: child(ren); community People in Home: alone Family Caregiver if Needed: none   Planned Interventions  COPD Reviewed provider's plan for COPD management.  Reviewed medications and indications for use. Reports not using nebulizers. Primarily using Trelegy. Agreed to update team with concerns regarding prescription cost. Discussed triggers.  Reports episodes of shortness of breath which she attributes to panic attacks r/t anxiety. Reports a cold/sinus congestion and cough over a week ago. Reports symptoms have resolved. Reports respiratory symptoms have been well controlled with inhaler. Reports monitoring oxygen saturations. Readings have been between 96-98%. Does not require supplemental oxygen. Reviewed current action plan and importance of daily self-assessment. Advised to make an appointment if experiencing moderate symptoms for greater than 48 hours without improvement.  Provided information regarding infection prevention and increased risk r/t COPD. Advised to utilize prevention strategies to reduce risk of respiratory infection.  Reports completing follow ups with Dr. Mannam/Pulmonology every 4-6 months. Reviewed worsening symptoms that require immediate medical attention.    Pre-Diabetes/Hyperglycemia Reviewed provider's plan for Pre-Diabetes/Hyperglycemia.  Discussed nutritional intake. Advised to increase consumption of fruits, vegetables, and lean proteins. Advised to monitor intake of carbohydrates and avoid foods and beverages with added sugar when possible. Will send additional educational documents.  Discussed importance of engaging inconsistent exercises. Report going to the gym 3 days week Exercises for approximately 30-45 min during gym session. Reports tolerating exercises without difficulty.  Discussed importance of completing ordered labs as prescribed.  Assessed social determinant of health barriers.   Interaction and coordination with outside resources, practitioners, and providers See CCM Referral  Care Plan: Printed and mailed to patient

## 2022-10-17 NOTE — Patient Instructions (Signed)
Thank you for allowing the Chronic Care Management team to participate in your care. It was great speaking with you!   Following is a copy of the CCM Program Consent:  CCM service includes personalized support from designated clinical staff supervised by the physician, including individualized plan of care and coordination with other care providers 24/7 contact phone numbers for assistance for urgent and routine care needs. Service will only be billed when office clinical staff spend 20 minutes or more in a month to coordinate care. Only one practitioner may furnish and bill the service in a calendar month. The patient may stop CCM services at amy time (effective at the end of the month) by phone call to the office staff. The patient will be responsible for cost sharing (co-pay) or up to 20% of the service fee (after annual deductible is met)   Following is a copy of your full provider care plan:   Goals Addressed             This Visit's Progress    Goal: CCM (COPD) Expected Outcome:  Monitor, Self-Manage And Reduce Symptoms of COPD       Current Barriers:  Chronic Disease Management support and education needs related to COPD  Planned Interventions: Reviewed provider's plan for COPD management.  Reviewed medications and indications for use. Reports not using nebulizers. Primarily using Trelegy. Agreed to update team with concerns regarding prescription cost. Discussed triggers. Reports episodes of shortness of breath which she attributes to panic attacks r/t anxiety. Reports a cold/sinus congestion and cough over a week ago. Reports symptoms have resolved. Reports respiratory symptoms have been well controlled with inhaler. Reports monitoring oxygen saturations. Readings have been between 96-98%. Does not require supplemental oxygen. Reviewed current action plan and importance of daily self-assessment. Advised to make an appointment if experiencing moderate symptoms for greater than 48  hours without improvement.  Provided information regarding infection prevention and increased risk r/t COPD. Advised to utilize prevention strategies to reduce risk of respiratory infection.  Reports completing follow ups with Dr. Mannam/Pulmonology every 4-6 months. Reviewed worsening symptoms that require immediate medical attention.   Symptom Management: Complete appointments with the medical team as scheduled Take medications and use inhalers as prescribed Assess symptoms daily Notify provider if in the yellow zone for 48 hrs without improvement Follow recommendations to prevent respiratory infection Notify provider or care management team with questions and new concerns as needed   Follow Up Plan:  Will follow up next month      Goal: CCM (Pre-Diabetes) Expected Outcome:  Monitor, Self-Manage And Reduce Symptoms of Pre-Diabetes       Current Barriers:  Chronic Disease Management support and education needs related to Pre-Diabetes  Planned Interventions: Reviewed provider's plan for Pre-Diabetes/Hyperglycemia.  Discussed nutritional intake. Advised to increase consumption of fruits, vegetables, and lean proteins. Advised to monitor intake of carbohydrates and avoid foods and beverages with added sugar when possible. Will send additional educational documents.  Discussed importance of engaging inconsistent exercises. Report going to the gym 3 days week Exercises for approximately 30-45 min during gym session. Reports tolerating exercises without difficulty.  Discussed importance of completing ordered labs as prescribed.  Assessed social determinant of health barriers.   Lab Results  Component Value Date   HGBA1C 6.4 06/27/2022    Symptom Management: Attend all scheduled provider appointment Continue engaging in consistent exercise programs Read food labels for fat, fiber, carbohydrates and portion size Complete labs as ordered Call provider office for new  concerns or  questions    Follow Up Plan:  Will follow up next month             Ms. Soule verbalized understanding of instructions, educational materials, and care plan provided today. Agreed to receive a mailed copy of patient instructions, educational materials, and care plan.   A member of the care management team will follow up next month.     [name] RN Care Manager/Chronic Care Management [phone]

## 2022-10-18 NOTE — Progress Notes (Signed)
CBC WNL.  Glucose is 149. Rest of CMP WNL.  ESR is borderline elevated-41.

## 2022-10-20 LAB — COMPLETE METABOLIC PANEL WITH GFR
AG Ratio: 1.4 (calc) (ref 1.0–2.5)
ALT: 18 U/L (ref 6–29)
AST: 14 U/L (ref 10–35)
Albumin: 3.8 g/dL (ref 3.6–5.1)
Alkaline phosphatase (APISO): 149 U/L (ref 37–153)
BUN: 12 mg/dL (ref 7–25)
CO2: 26 mmol/L (ref 20–32)
Calcium: 10 mg/dL (ref 8.6–10.4)
Chloride: 103 mmol/L (ref 98–110)
Creat: 0.97 mg/dL (ref 0.60–1.00)
Globulin: 2.8 g/dL (calc) (ref 1.9–3.7)
Glucose, Bld: 149 mg/dL — ABNORMAL HIGH (ref 65–99)
Potassium: 4.7 mmol/L (ref 3.5–5.3)
Sodium: 139 mmol/L (ref 135–146)
Total Bilirubin: 0.3 mg/dL (ref 0.2–1.2)
Total Protein: 6.6 g/dL (ref 6.1–8.1)
eGFR: 63 mL/min/{1.73_m2} (ref >=60)

## 2022-10-20 LAB — CBC WITH DIFFERENTIAL/PLATELET
Absolute Monocytes: 470 cells/uL (ref 200–950)
Basophils Absolute: 31 cells/uL (ref 0–200)
Basophils Relative: 0.4 %
Eosinophils Absolute: 123 cells/uL (ref 15–500)
Eosinophils Relative: 1.6 %
HCT: 38.1 % (ref 35.0–45.0)
Hemoglobin: 12.5 g/dL (ref 11.7–15.5)
Lymphs Abs: 1679 cells/uL (ref 850–3900)
MCH: 28.2 pg (ref 27.0–33.0)
MCHC: 32.8 g/dL (ref 32.0–36.0)
MCV: 85.8 fL (ref 80.0–100.0)
MPV: 9.9 fL (ref 7.5–12.5)
Monocytes Relative: 6.1 %
Neutro Abs: 5398 cells/uL (ref 1500–7800)
Neutrophils Relative %: 70.1 %
Platelets: 266 10*3/uL (ref 140–400)
RBC: 4.44 10*6/uL (ref 3.80–5.10)
RDW: 13.6 % (ref 11.0–15.0)
Total Lymphocyte: 21.8 %
WBC: 7.7 10*3/uL (ref 3.8–10.8)

## 2022-10-20 LAB — SEDIMENTATION RATE: Sed Rate: 41 mm/h — ABNORMAL HIGH (ref 0–30)

## 2022-10-20 LAB — C-REACTIVE PROTEIN: CRP: 20.1 mg/L — ABNORMAL HIGH (ref <8.0)

## 2022-10-21 NOTE — Progress Notes (Signed)
CRP is elevated.   Please clarify if she is experiencing any signs or symptoms of a flare?

## 2022-10-21 NOTE — Progress Notes (Signed)
Please schedule an ultrasound of both hands to assess for synovitis

## 2022-10-22 LAB — HM MAMMOGRAPHY

## 2022-10-23 ENCOUNTER — Telehealth: Payer: Self-pay | Admitting: Family Medicine

## 2022-10-23 ENCOUNTER — Encounter: Payer: Self-pay | Admitting: Family Medicine

## 2022-10-23 DIAGNOSIS — G2581 Restless legs syndrome: Secondary | ICD-10-CM

## 2022-10-23 NOTE — Telephone Encounter (Signed)
Pt is calling and rOPINIRole (REQUIP) 0.5 MG tablet  is not working for RLS and pt would like something else sent to  [place], [place] - [address] Phone: [phone]  Fax: [phone]

## 2022-10-24 ENCOUNTER — Ambulatory Visit: Payer: Medicare Other | Attending: Rheumatology | Admitting: Rheumatology

## 2022-10-24 ENCOUNTER — Ambulatory Visit: Payer: Medicare Other

## 2022-10-24 DIAGNOSIS — M79642 Pain in left hand: Secondary | ICD-10-CM | POA: Diagnosis not present

## 2022-10-24 DIAGNOSIS — M79641 Pain in right hand: Secondary | ICD-10-CM | POA: Diagnosis not present

## 2022-10-24 MED ORDER — ROPINIROLE HCL 1 MG PO TABS: 1.0000 mg | ORAL_TABLET | Freq: Every day | ORAL | 0 refills | Status: DC

## 2022-10-24 NOTE — Telephone Encounter (Signed)
Patient informed of the message below and expressed understanding

## 2022-10-24 NOTE — Telephone Encounter (Signed)
I can increase to 1 mg at bedtime-- please let patient know to try this first

## 2022-10-24 NOTE — Progress Notes (Signed)
Visit diagnosis-pain in both hands  Ultrasound examination of bilateral hands was performed per EULAR recommendations. Using 15 MHz transducer, grayscale and power Doppler bilateral second,and  third MCP joints and bilateral wrist joints both dorsal and volar aspects were evaluated to look for synovitis or tenosynovitis. The findings were there was no synovitis but mild tenosynovitis was noted around the left second MCP joint on ultrasound examination.   Impression: Ultrasound examination showed left second digit tenosynovitis.  Ultrasound findings were discussed with the patient.  Bo Merino, MD

## 2022-10-24 NOTE — Progress Notes (Signed)
Normal mammogram, repeat yearly.

## 2022-10-28 ENCOUNTER — Ambulatory Visit
Admission: RE | Admit: 2022-10-28 | Discharge: 2022-10-28 | Disposition: A | Discharge status: Home / Self Care | Payer: Medicare Other | Source: Ambulatory Visit | Attending: Orthopaedic Surgery | Admitting: Orthopaedic Surgery

## 2022-10-28 DIAGNOSIS — M545 Low back pain, unspecified: Secondary | ICD-10-CM

## 2022-10-29 NOTE — Telephone Encounter (Signed)
Received fax from [facility] for Cimzia in-office verification of benefits. Patient has Medicare + [place]. Both plans are active. [employer] supplemental plan follows Medicare guidelines.   [facility] is in network. Patient has $240 Medicare A/B deductible. Once deductible is met, patient's [employer] [place] will pick up 20% coinsurance that is associated with Cimzia in-office injection. 80% is covered by Medicare A/B. Pre-certification is not required for Cimzia J0717. CPT code [phone] is valid and billable with 20% coinsurance.   Pdf sent to media tab of patient's chart.   Knox Saliva, PharmD, MPH, BCPS, CPP Clinical Pharmacist (Rheumatology and Pulmonology)  Knox Saliva, PharmD, MPH, BCPS, CPP Clinical Pharmacist (Rheumatology and Pulmonology)

## 2022-10-30 ENCOUNTER — Ambulatory Visit: Payer: Medicare Other | Admitting: Orthopaedic Surgery

## 2022-10-31 ENCOUNTER — Encounter: Payer: Self-pay | Admitting: Orthopaedic Surgery

## 2022-10-31 ENCOUNTER — Ambulatory Visit (INDEPENDENT_AMBULATORY_CARE_PROVIDER_SITE_OTHER): Payer: Medicare Other | Admitting: Orthopaedic Surgery

## 2022-10-31 DIAGNOSIS — M545 Low back pain, unspecified: Secondary | ICD-10-CM | POA: Diagnosis not present

## 2022-10-31 DIAGNOSIS — G8929 Other chronic pain: Secondary | ICD-10-CM

## 2022-10-31 NOTE — Progress Notes (Signed)
The patient comes in today to go over an MRI of her lumbar spine.  She has had both her hips replaced and her left knee replaced.  She was having worsening low back pain and is still consistent in the midline and just lateral and medial to the midline of her lower lumbar spine.  She denies any fever chills or recent illnesses.  She has an old compression deformity that was even seen on MRI in 2021 at L1.  I have seen recent plain films of her lumbar spine and this is not changed.  She still denies any radicular symptoms going down her legs at all.  She is not sick appearing.  She has a regular patient of rheumatology as well.  On my exam today her pain is still only in the lower lumbar spine and just medial and lateral to the midline at the lower levels.  There is no pain in the upper lumbar spine or thoracic spine on my exam.  She does not have any radicular symptoms going down either leg.  The MRIs reviewed and it does show a chronic compression deformity at L1.  There is some degenerative changes at that level.  The radiologist comments about the potential for discitis or osteomyelitis but to correlate this clinically.  Clinically she does not seem to be presenting as similar with the discitis.  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  I went over the MRI findings with her.  She has remote history of injections in her back so she understands this aspect of the treatment recommendation.  I would like to send her to Dr. Ernestina Patches for considering bilateral L3-L4 facet joint injections.  Once he sees her and if the does provide an intervention for her lumbar spine, he can then get her back to [place] 2 to 4 weeks later.

## 2022-11-01 ENCOUNTER — Other Ambulatory Visit: Payer: Self-pay

## 2022-11-01 DIAGNOSIS — M545 Low back pain, unspecified: Secondary | ICD-10-CM

## 2022-11-06 DIAGNOSIS — J441 Chronic obstructive pulmonary disease with (acute) exacerbation: Secondary | ICD-10-CM

## 2022-11-12 ENCOUNTER — Ambulatory Visit: Payer: Medicare Other | Attending: Rheumatology | Admitting: *Deleted

## 2022-11-12 VITALS — BP 125/80 | HR 87 | Resp 20

## 2022-11-12 DIAGNOSIS — Z79899 Other long term (current) drug therapy: Secondary | ICD-10-CM | POA: Insufficient documentation

## 2022-11-12 DIAGNOSIS — M45 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: Secondary | ICD-10-CM | POA: Diagnosis present

## 2022-11-12 MED ORDER — CERTOLIZUMAB PEGOL 2 X 200 MG ~~LOC~~ KIT
400.0000 mg | PACK | Freq: Once | SUBCUTANEOUS | Status: AC
  Administered 2022-11-12: 400 mg via SUBCUTANEOUS

## 2022-11-12 NOTE — Progress Notes (Signed)

## 2022-11-14 ENCOUNTER — Ambulatory Visit: Payer: Medicare Other

## 2022-11-14 ENCOUNTER — Telehealth: Payer: Self-pay | Admitting: Physical Medicine and Rehabilitation

## 2022-11-14 NOTE — Telephone Encounter (Signed)
Patient called needing to reschedule her appointment. The number to contact patient is [phone]

## 2022-11-14 NOTE — Telephone Encounter (Signed)
Spoke with patient and rescheduled for 12/02/22

## 2022-11-19 ENCOUNTER — Encounter: Payer: Medicare Other | Admitting: Physical Medicine and Rehabilitation

## 2022-11-21 ENCOUNTER — Telehealth: Payer: Self-pay | Admitting: Orthopaedic Surgery

## 2022-11-21 ENCOUNTER — Telehealth: Payer: Self-pay | Admitting: Family Medicine

## 2022-11-21 NOTE — Telephone Encounter (Signed)
Spoke with the patient and advised she contact Dr Trevor Mace office for refills as below.

## 2022-11-21 NOTE — Telephone Encounter (Signed)
I called and advised pt. She stated understanding and stated she will reach out to her pcp

## 2022-11-21 NOTE — Telephone Encounter (Signed)
Patient requesting a fill of HYDROcodone-acetaminophen (NORCO/VICODIN) 5-325 MG tablet prescribed by another provider.   [place], [place] - [id number] Phone: [phone]  Fax: [phone]

## 2022-11-21 NOTE — Telephone Encounter (Signed)
Patient requesting pain medication stronger than what was prescribed for her she states it not helping with the pain. Please advise

## 2022-11-21 NOTE — Telephone Encounter (Signed)
Dr. Ninfa Linden wrote this for her, please send this message to him

## 2022-11-28 NOTE — Progress Notes (Unsigned)
Office Visit Note  [name]             Date of Birth: [birth year]           MRN: [id number]             PCP: Farrel Conners, MD Referring: Farrel Conners, MD Visit Date: 12/11/2022 Occupation: '@GUAROCC'$ @  Subjective:  Medication monitoring   History of Present Illness: Judy [name] is a 72 y.o. female with history of ankylosing spondylitis and DDD.  Patient remains on Cimzia 400 mg sq injections every 4 weeks. Patient reinitiated Cimzia on 04/18/2022.  She continues to tolerate Cimzia without any side effects.  She presents today for clinic administer dose of Cimzia.  She has not had any recent or recurrent infections.  She has no upcoming procedures or scheduled surgeries. Patient had a recent facet joint injection on 12/02/2022 which has provided significant relief.  She has been going to the gym 3 days a week for muscle strengthening.  She has no longer using a cane to assist with ambulation.  She does have ongoing chronic pain and swelling in the left knee replacement.  She was recently started on Celebrex 200 mg 1 tablet by mouth daily as needed for pain relief.  She has been taking Celebrex sparingly and has not yet noticed much improvement in regards to chronic pain.  She has been tolerating Celebrex without any side effects.  She continues to experience discomfort and stiffness in both hands but denies any joint swelling.   Activities of Daily Living:  Patient reports morning stiffness for 0 minutes.   Patient Reports nocturnal pain.  Difficulty dressing/grooming: Denies Difficulty climbing stairs: Reports Difficulty getting out of chair: Denies Difficulty using hands for taps, buttons, cutlery, and/or writing: Denies  Review of Systems  Constitutional:  Positive for fatigue.  HENT: Negative.  Negative for mouth sores and mouth dryness.   Eyes: Negative.  Negative for dryness.  Respiratory:  Positive for shortness of breath.   Cardiovascular:  Negative.  Negative for chest pain and palpitations.  Gastrointestinal: Negative.  Negative for blood in stool, constipation and diarrhea.  Endocrine: Negative.  Negative for increased urination.  Genitourinary: Negative.  Negative for involuntary urination.  Musculoskeletal:  Positive for joint pain, joint pain, joint swelling and muscle weakness. Negative for gait problem, myalgias, morning stiffness, muscle tenderness and myalgias.  Skin:  Negative for color change, rash, hair loss and sensitivity to sunlight.  Allergic/Immunologic: Negative.  Negative for susceptible to infections.  Neurological: Negative.  Negative for dizziness and headaches.  Hematological: Negative.  Negative for swollen glands.  Psychiatric/Behavioral:  Positive for depressed mood and sleep disturbance. The patient is nervous/anxious.     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 ([place]) 11/30/2020   Status post total replacement of right hip 06/04/2019   Status post total replacement of left hip 03/19/2019   PTSD (post-traumatic stress disorder)    COPD (chronic obstructive pulmonary disease) (HCC)    Bipolar 1 disorder ([place])    Anxiety    B12 deficiency 12/09/2018   Hyperglycemia 04/14/2017    Past Medical History:  Diagnosis Date   Anxiety    Arthritis    Avascular necrosis ([place])  Left hip   Avascular necrosis of bone of hip, left ([place]) 12/28/2018   Avascular necrosis of bone of right hip ([place]) 04/28/2019   Bipolar 1 disorder (HCC)    COPD (chronic obstructive pulmonary disease) (HCC)    Depression    Dyspnea    Pneumonia    PTSD (post-traumatic stress disorder)     Family History  Problem Relation Age of Onset   CAD Mother 54   COPD Mother    Depression  Mother    Heart attack Father    Colon polyps Sister    Cancer Maternal Aunt        growth on face- underwent chemo   Heart attack Maternal Grandmother    Early death Maternal Grandfather    Hearing loss Paternal Grandfather    Asthma Daughter    Bipolar disorder Son    Past Surgical History:  Procedure Laterality Date   bone spurs foot Right    CATARACT EXTRACTION Bilateral    COLONOSCOPY     COLONOSCOPY  07/2022   HAND SURGERY Left    JOINT REPLACEMENT     TONSILLECTOMY     TONSILLECTOMY AND ADENOIDECTOMY     TOTAL HIP ARTHROPLASTY Left 03/19/2019   Procedure: LEFT TOTAL HIP ARTHROPLASTY ANTERIOR APPROACH;  Surgeon: Mcarthur Rossetti, MD;  Location: [facility];  Service: Orthopedics;  Laterality: Left;   TOTAL HIP ARTHROPLASTY Right 06/04/2019   Procedure: RIGHT TOTAL HIP ARTHROPLASTY ANTERIOR APPROACH;  Surgeon: Mcarthur Rossetti, MD;  Location: [facility];  Service: Orthopedics;  Laterality: Right;   TOTAL KNEE ARTHROPLASTY Left 12/28/2021   Procedure: Left TOTAL KNEE ARTHROPLASTY;  Surgeon: Mcarthur Rossetti, MD;  Location: [facility];  Service: Orthopedics;  Laterality: Left;   Social History   Social History Narrative   Not on file   Immunization History  Administered Date(s) Administered   Influenza, High Dose Seasonal PF 08/25/2018   Moderna Sars-Covid-2 Vaccination 11/08/2019, 12/05/2019, 10/09/2020   Pneumococcal Conjugate-13 09/23/2018   Pneumococcal Polysaccharide-23 08/27/2016   Tdap 10/07/2012   Yellow Fever 07/05/2008     Objective: Vital Signs: BP 133/79 (BP Location: Left Arm, Patient Position: Sitting, Cuff Size: Large)   Pulse 69   Resp 18   Ht '5\' 10"'$  (1.778 m)   Wt 226 lb (102.5 kg)   BMI 32.43 kg/m    Physical Exam Vitals and nursing note reviewed.  Constitutional:      Appearance: She is well-developed.  HENT:     Head: Normocephalic and atraumatic.  Eyes:     Conjunctiva/sclera: Conjunctivae normal.  Cardiovascular:     Rate  and Rhythm: Normal rate and regular rhythm.     Heart sounds: Normal heart sounds.  Pulmonary:     Effort: Pulmonary effort is normal.     Breath sounds: Normal breath sounds.  Abdominal:     General: Bowel sounds are normal.     Palpations: Abdomen is soft.  Musculoskeletal:     Cervical back: Normal range of motion.  Skin:    General: Skin is warm and dry.     Capillary Refill: Capillary refill takes less than 2 seconds.  Neurological:     Mental Status: She is alert and oriented to person, place, and time.  Psychiatric:        Behavior: Behavior normal.      Musculoskeletal Exam: C-spine, thoracic spine, lumbar spine have severely limited range of motion.  Thoracic kyphosis noted.  Patient remained seated during examination today.  Shoulder  joints have good range of motion with no discomfort or tenderness.  Flexion contractures in both elbows.  Wrist joints, MCPs, PIPs, DIPs have good range of motion with no synovitis.  PIP and DIP thickening consistent with osteoarthritis of both hands.  Complete fist formation noted bilaterally.  Hip joint replacements difficult to assess in seated position.  Left knee replacement has some discomfort and stiffness with extension.  Right knee joint has good range of motion with no warmth or effusion.  Ankle joints have good range of motion with no tenderness or joint swelling.  No evidence of Achilles tendinitis.  CDAI Exam: CDAI Score: -- Patient Global: --; Provider Global: -- Swollen: --; Tender: -- Joint Exam 12/11/2022   No joint exam has been documented for this visit   There is currently no information documented on the homunculus. Go to the Rheumatology activity and complete the homunculus joint exam.  Investigation: No additional findings.  Imaging: XR C-ARM NO REPORT  Result Date: 12/02/2022 Please see Notes tab for imaging impression.   Recent Labs: Lab Results  Component Value Date   WBC 7.7 10/17/2022   HGB 12.5 10/17/2022    PLT 266 10/17/2022   NA 139 10/17/2022   K 4.7 10/17/2022   CL 103 10/17/2022   CO2 26 10/17/2022   GLUCOSE 149 (H) 10/17/2022   BUN 12 10/17/2022   CREATININE 0.97 10/17/2022   BILITOT 0.3 10/17/2022   ALKPHOS 144 (H) 10/30/2020   AST 14 10/17/2022   ALT 18 10/17/2022   PROT 6.6 10/17/2022   ALBUMIN 4.2 10/30/2020   CALCIUM 10.0 10/17/2022   GFRAA 54 (L) 04/11/2021   QFTBGOLDPLUS NEGATIVE 04/18/2022    Speciality Comments: Cimzia completed loading on June 22, 2020  Procedures:  No procedures performed Allergies: Celexa [citalopram hydrobromide] and Gabapentin   Assessment / Plan:     Visit Diagnoses: 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 ([place]) - C-spine, thoracic spine, and lumbar spine fused.  Patient has severely limited range of motion of the C-spine, thoracic spine, and lumbar spine.  Thoracic kyphosis noted.  She has no synovitis or dactylitis on examination today.  No evidence of Achilles tendinitis or plantar fasciitis.  No signs or symptoms of uveitis.   Her lower back pain has improved significantly since having a facet joint injection performed on 12/02/2022 by Dr. Ernestina Patches.  She remains on Cimzia every 4 weeks as administered in the office.  She was recently started on Celebrex 200 mg 1 capsule daily as needed for pain relief.  She has been taking Celebrex sparingly and has not yet noticed any benefit.  She plans on starting to take Celebrex more consistently to see if it will better help manage her pain from previous damage and underlying osteoarthritis.  She will remain on Cimzia as prescribed.  She was advised to notify us if she develops increased joint pain or joint swelling.  She will follow-up in the office in 5 months or sooner if needed.  Plan: certolizumab pegol (CIMZIA) kit 400 mg  High risk medication use - Cimzia 400 mg sq injections every 4 weeks.  Patient reinitiated Cimzia on 04/18/2022.  CBC and CMP updated on 10/17/22. Her next lab work will  be due in April and every 3 months.  Standing orders for CBC and CMP remain in place. TB gold negative on 04/18/22.  No recent or recurrent infections.  Discussed the importance of holding cimzia if she develops signs or symptoms of an infection and to  resume once the infection has completely cleared.  She is not scheduled for any upcoming procedures or surgeries at this time.  Contracture of joint of both elbows: Unchanged.  No tenderness or inflammation noted.  Bilateral hand pain: She has PIP and DIP thickening consistent with osteoarthritis of both hands.  She continues to experience pain and stiffness in both hands.  She has had to massage her hands throughout the day for relief.  She had ultrasound of both hands on 10/24/2022 which did not reveal any synovitis but she had mild tenosynovitis in the left second MCP.  On examination today she has no tenderness or obvious inflammation.  Discussed the importance of joint protection and muscle strengthening.  She has been started on Celebrex 200 mg 1 capsule daily as needed.  She has not been taking Celebrex consistently but plans to try taking it more frequently to see if it will minimize her discomfort from underlying osteoarthritis.  DDD (degenerative disc disease), lumbar: MRI of the lumbar spine updated on 10/28/2022.  She underwent a facet joint injection on 12/02/2022 by Dr. Ernestina Patches.  She has noticed unremarkable improvement in her symptoms since undergoing the procedure.  She has no longer using a cane.  Spinal stenosis of lumbosacral region: Chronic pain.  Recent facet joint injection performed on 12/02/2022.  Chronic SI joint pain: No SI joint tenderness currently.  Status post total replacement of both hips - Hx of avascular necrosis.  Chronic stiffness.   S/P total knee arthroplasty, left - Dr. Blackman-12/28/21. Chronic pain and stiffness.  She is no longer using a cane.  No recent falls.  She has been performing muscle strengthening exercises  at the gym 3 days a week.    Other medical conditions are listed as follows:   Bipolar 1 disorder ([place])  PTSD (post-traumatic stress disorder)  History of anxiety  B12 deficiency  Pulmonary nodules  History of COPD  Former smoker  Yeast infection of the skin  Smoker  Orders: No orders of the defined types were placed in this encounter.  Meds ordered this encounter  Medications   certolizumab pegol (CIMZIA) kit 400 mg      Follow-Up Instructions: Return in about 5 months (around 05/13/2023) for Ankylosing Spondylitis, DDD.   Ofilia Neas, PA-C  Note - This record has been created using Dragon software.  Chart creation errors have been sought, but may not always  have been located. Such creation errors do not reflect on  the standard of medical care.

## 2022-12-02 ENCOUNTER — Ambulatory Visit: Payer: Self-pay

## 2022-12-02 ENCOUNTER — Ambulatory Visit (INDEPENDENT_AMBULATORY_CARE_PROVIDER_SITE_OTHER): Payer: Medicare Other | Admitting: Physical Medicine and Rehabilitation

## 2022-12-02 VITALS — BP 139/81 | HR 85

## 2022-12-02 DIAGNOSIS — M47816 Spondylosis without myelopathy or radiculopathy, lumbar region: Secondary | ICD-10-CM | POA: Diagnosis not present

## 2022-12-02 MED ORDER — METHYLPREDNISOLONE ACETATE 80 MG/ML IJ SUSP
80.0000 mg | Freq: Once | INTRAMUSCULAR | Status: AC
  Administered 2022-12-02: 80 mg

## 2022-12-02 MED ORDER — CELECOXIB 200 MG PO CAPS: 200.0000 mg | ORAL_CAPSULE | Freq: Every day | ORAL | 0 refills | Status: DC

## 2022-12-02 NOTE — Progress Notes (Unsigned)
Functional Pain Scale - descriptive words and definitions  Mild (2)   Noticeable when not distracted/no impact on ADL's/sleep only slightly affected and able to   use both passive and active distraction for comfort. Mild range order  Average Pain  varies   +Driver, -BT, -Dye Allergies.  Lower back pain on both sides with radiation to the hips

## 2022-12-02 NOTE — Patient Instructions (Signed)

## 2022-12-03 ENCOUNTER — Telehealth: Payer: Self-pay

## 2022-12-03 NOTE — Telephone Encounter (Signed)
-----   Message from Magnus Sinning, MD sent at 12/02/2022  4:57 PM EST ----- Please call her and let her know I sent in Rx for Celebrex. Hazel Sams, PA from [facility] said ok for short course.

## 2022-12-03 NOTE — Telephone Encounter (Signed)
Left detailed voicemail explaining Celebrex was sent to pharmacy for short course

## 2022-12-04 NOTE — Progress Notes (Signed)
Judy [name] - 72 y.o. female MRN [id number]  Date of birth: 1951-04-17  Office Visit Note: Visit Date: 12/02/2022 PCP: Farrel Conners, MD Referred by: Farrel Conners, MD  Subjective: Chief Complaint  Patient presents with   Lower Back - Pain   HPI:  Judy [name] is a 72 y.o. female who comes in today at the request of Dr. Jean Rosenthal for planned Bilateral  L3-4 Lumbar facet/medial branch block with fluoroscopic guidance.  The patient has failed conservative care including home exercise, medications, time and activity modification.  This injection will be diagnostic and hopefully therapeutic.  Please see requesting physician notes for further details and justification.  Exam has shown concordant pain with facet joint loading.  She asked me today about the use of short-term Celebrex for her pain.  She has had some opioid treatment without any results.  She has ankylosing spondylitis treated by Dr. Estanislado Pandy.  Her husband evidently is a Software engineer and they are asking about Celebrex.  I did tell her that I would go ahead and speak with Dr. Estanislado Pandy or her physician assistant to see if it would be okay to do a short-term course of that while she is waiting for the injection to help.**Addendum 3 messaging I was able to get in touch with Hazel Sams, PA-C who felt like the Celebrex would be okay short-term and then by Dr. Estanislado Pandy who felt like over-the-counter NSAID as needed might be a better choice.  I had already prescribed the Celebrex to be used short-term we did let her know it would be a short-term trial until the injection helped.  She will follow-up with Dr. Estanislado Pandy for her ankylosing spondylitis.   ROS Otherwise per HPI.  Assessment & Plan: Visit Diagnoses:    ICD-10-CM   1. Spondylosis without myelopathy or radiculopathy, lumbar region  M47.816 XR C-ARM NO REPORT    Facet Injection    methylPREDNISolone acetate (DEPO-MEDROL) injection 80 mg       Plan: No additional findings.   Meds & Orders:  Meds ordered this encounter  Medications   methylPREDNISolone acetate (DEPO-MEDROL) injection 80 mg   celecoxib (CELEBREX) 200 MG capsule    Sig: Take 1 capsule (200 mg total) by mouth daily.    Dispense:  30 capsule    Refill:  0    Orders Placed This Encounter  Procedures   Facet Injection   XR C-ARM NO REPORT    Follow-up: Return for visit to requesting provider as needed.   Procedures: No procedures performed  Lumbar Facet Joint Intra-Articular Injection(s) with Fluoroscopic Guidance  [name]      Date of Birth: [birth year] MRN: [id number] PCP: Farrel Conners, MD      Visit Date: 12/02/2022   Universal Protocol:    Date/Time: 12/02/2022  Consent Given By: the patient  Position: PRONE   Additional Comments: Vital signs were monitored before and after the procedure. Patient was prepped and draped in the usual sterile fashion. The correct patient, procedure, and site was verified.   Injection Procedure Details:  Procedure Site One Meds Administered:  Meds ordered this encounter  Medications   methylPREDNISolone acetate (DEPO-MEDROL) injection 80 mg   celecoxib (CELEBREX) 200 MG capsule    Sig: Take 1 capsule (200 mg total) by mouth daily.    Dispense:  30 capsule    Refill:  0     Laterality: Bilateral  Location/Site:  L3-L4  Needle size: 22 guage  Needle  type: Spinal  Needle Placement: Articular  Findings:  -Comments: Excellent flow of contrast producing a partial arthrogram.  Procedure Details: The fluoroscope beam is vertically oriented in AP, and the inferior recess is visualized beneath the lower pole of the inferior apophyseal process, which represents the target point for needle insertion. When direct visualization is difficult the target point is located at the medial projection of the vertebral pedicle. The region overlying each aforementioned target is locally  anesthetized with a 1 to 2 ml. volume of 1% Lidocaine without Epinephrine.   The spinal needle was inserted into each of the above mentioned facet joints using biplanar fluoroscopic guidance. A 0.25 to 0.5 ml. volume of Isovue-250 was injected and a partial facet joint arthrogram was obtained. A single spot film was obtained of the resulting arthrogram.    One to 1.25 ml of the steroid/anesthetic solution was then injected into each of the facet joints noted above.   Additional Comments:  The patient tolerated the procedure well Dressing: 2 x 2 sterile gauze and Band-Aid    Post-procedure details: Patient was observed during the procedure. Post-procedure instructions were reviewed.  Patient left the clinic in stable condition.    Clinical History: MRI LUMBAR SPINE WITHOUT CONTRAST   TECHNIQUE: Multiplanar, multisequence MR imaging of the lumbar spine was performed. No intravenous contrast was administered.   COMPARISON:  01/06/2020   FINDINGS: Segmentation:  5 lumbar type vertebral bodies.   Alignment:  Mild dextrocurvature.  No significant listhesis.   Vertebrae: No acute fracture or suspicious osseous lesion. Redemonstrated compression deformity of L1, which appears unchanged. Increased T2 signal in the T12-L1 disc, with some loss of cortical signal in the T12-L1 endplates (series 5, image 8-9).   Conus medullaris and cauda equina: Conus extends to the L2 level. Conus and cauda equina appear normal.   Paraspinal and other soft tissues: Atrophy of the right psoas muscle, which is new from the prior exam.   Disc levels:   T12-L1: Minimal disc bulge. Mild facet arthropathy. No spinal canal stenosis or neural foraminal narrowing.   L1-L2: Minimal disc bulge. No spinal canal stenosis or neural foraminal narrowing.   L2-L3: Minimal disc bulge. No spinal canal stenosis or neural foraminal narrowing.   L3-L4: Mild disc bulge, which has progressed from prior exam,  with superimposed left paracentral disc extrusion with 12 mm of caudal migration, which is new from the prior exam. Severe facet arthropathy with suspected medially oriented facet synovial cyst, which measures 2 x 2 x 4 mm (series 13, image 26). Mild-to-moderate spinal canal stenosis. Narrowing of the lateral recesses. Mild left neural foraminal narrowing, which is new from the prior exam.   L4-L5: Disc height loss with small left paracentral disc protrusion, which narrows the left lateral recess. No spinal canal stenosis or neural foraminal narrowing.   L5-S1: Large left lateral osteophyte, which may abut the exiting left L5 nerve. No significant disc bulge. Moderate facet arthropathy. No spinal canal stenosis or neural foraminal narrowing.   IMPRESSION: 1. Increased T2 signal in the T12-L1 disc, with some loss of cortical signal in the T12-L1 endplates. While this could be degenerative in nature, early discitis/osteomyelitis could have a similar appearance. Recommend correlation with inflammatory markers. 2. L3-L4 mild-to-moderate spinal canal stenosis and mild left neural foraminal narrowing, which is new from the prior exam. Narrowing of the lateral recesses at this level could affect the descending L4 nerve roots. 3. L4-L5 narrowing of the left lateral recess, which could affect the  descending left L5 nerve. 4. L5-S1 large left lateral osteophyte, which may abut the exiting left L5 nerve. 5. Atrophy of the right psoas muscle, which is new from the prior exam.     Electronically Signed   By: Merilyn Baba M.D.   On: 10/29/2022 12:33     Objective:  VS:  HT:    WT:   BMI:     BP:139/81  HR:85bpm  TEMP: ( )  RESP:  Physical Exam Vitals and nursing note reviewed.  Constitutional:      General: She is not in acute distress.    Appearance: Normal appearance. She is not ill-appearing.  HENT:     Head: Normocephalic and atraumatic.     Right Ear: External ear normal.      Left Ear: External ear normal.  Eyes:     Extraocular Movements: Extraocular movements intact.  Cardiovascular:     Rate and Rhythm: Normal rate.     Pulses: Normal pulses.  Pulmonary:     Effort: Pulmonary effort is normal. No respiratory distress.  Abdominal:     General: There is no distension.     Palpations: Abdomen is soft.  Musculoskeletal:        General: Tenderness present.     Cervical back: Neck supple.     Right lower leg: No edema.     Left lower leg: No edema.     Comments: Patient has good distal strength with no pain over the greater trochanters.  No clonus or focal weakness.  Skin:    Findings: No erythema, lesion or rash.  Neurological:     General: No focal deficit present.     Mental Status: She is alert and oriented to person, place, and time.     Sensory: No sensory deficit.     Motor: No weakness or abnormal muscle tone.     Coordination: Coordination normal.  Psychiatric:        Mood and Affect: Mood normal.        Behavior: Behavior normal.      Imaging: No results found.

## 2022-12-04 NOTE — Procedures (Signed)
Lumbar Facet Joint Intra-Articular Injection(s) with Fluoroscopic Guidance  [name]      Date of Birth: 16-Feb-1951 MRN: [id number] PCP: Farrel Conners, MD      Visit Date: 12/02/2022   Universal Protocol:    Date/Time: 12/02/2022  Consent Given By: the patient  Position: PRONE   Additional Comments: Vital signs were monitored before and after the procedure. Patient was prepped and draped in the usual sterile fashion. The correct patient, procedure, and site was verified.   Injection Procedure Details:  Procedure Site One Meds Administered:  Meds ordered this encounter  Medications   methylPREDNISolone acetate (DEPO-MEDROL) injection 80 mg   celecoxib (CELEBREX) 200 MG capsule    Sig: Take 1 capsule (200 mg total) by mouth daily.    Dispense:  30 capsule    Refill:  0     Laterality: Bilateral  Location/Site:  L3-L4  Needle size: 22 guage  Needle type: Spinal  Needle Placement: Articular  Findings:  -Comments: Excellent flow of contrast producing a partial arthrogram.  Procedure Details: The fluoroscope beam is vertically oriented in AP, and the inferior recess is visualized beneath the lower pole of the inferior apophyseal process, which represents the target point for needle insertion. When direct visualization is difficult the target point is located at the medial projection of the vertebral pedicle. The region overlying each aforementioned target is locally anesthetized with a 1 to 2 ml. volume of 1% Lidocaine without Epinephrine.   The spinal needle was inserted into each of the above mentioned facet joints using biplanar fluoroscopic guidance. A 0.25 to 0.5 ml. volume of Isovue-250 was injected and a partial facet joint arthrogram was obtained. A single spot film was obtained of the resulting arthrogram.    One to 1.25 ml of the steroid/anesthetic solution was then injected into each of the facet joints noted above.   Additional  Comments:  The patient tolerated the procedure well Dressing: 2 x 2 sterile gauze and Band-Aid    Post-procedure details: Patient was observed during the procedure. Post-procedure instructions were reviewed.  Patient left the clinic in stable condition.

## 2022-12-11 ENCOUNTER — Encounter: Payer: Self-pay | Admitting: Physician Assistant

## 2022-12-11 ENCOUNTER — Ambulatory Visit: Payer: Medicare Other | Attending: Physician Assistant | Admitting: Physician Assistant

## 2022-12-11 ENCOUNTER — Ambulatory Visit: Payer: Medicare Other

## 2022-12-11 VITALS — BP 133/79 | HR 69 | Resp 18 | Ht 70.0 in | Wt 226.0 lb

## 2022-12-11 DIAGNOSIS — R918 Other nonspecific abnormal finding of lung field: Secondary | ICD-10-CM

## 2022-12-11 DIAGNOSIS — Z96652 Presence of left artificial knee joint: Secondary | ICD-10-CM | POA: Diagnosis present

## 2022-12-11 DIAGNOSIS — E538 Deficiency of other specified B group vitamins: Secondary | ICD-10-CM | POA: Insufficient documentation

## 2022-12-11 DIAGNOSIS — M24521 Contracture, right elbow: Secondary | ICD-10-CM | POA: Insufficient documentation

## 2022-12-11 DIAGNOSIS — G8929 Other chronic pain: Secondary | ICD-10-CM | POA: Diagnosis present

## 2022-12-11 DIAGNOSIS — Z8659 Personal history of other mental and behavioral disorders: Secondary | ICD-10-CM | POA: Diagnosis present

## 2022-12-11 DIAGNOSIS — F4310 Post-traumatic stress disorder, unspecified: Secondary | ICD-10-CM | POA: Insufficient documentation

## 2022-12-11 DIAGNOSIS — Z8709 Personal history of other diseases of the respiratory system: Secondary | ICD-10-CM | POA: Diagnosis present

## 2022-12-11 DIAGNOSIS — M79642 Pain in left hand: Secondary | ICD-10-CM | POA: Diagnosis present

## 2022-12-11 DIAGNOSIS — F17200 Nicotine dependence, unspecified, uncomplicated: Secondary | ICD-10-CM | POA: Diagnosis present

## 2022-12-11 DIAGNOSIS — Z96643 Presence of artificial hip joint, bilateral: Secondary | ICD-10-CM

## 2022-12-11 DIAGNOSIS — Z79899 Other long term (current) drug therapy: Secondary | ICD-10-CM | POA: Insufficient documentation

## 2022-12-11 DIAGNOSIS — M4807 Spinal stenosis, lumbosacral region: Secondary | ICD-10-CM

## 2022-12-11 DIAGNOSIS — M79641 Pain in right hand: Secondary | ICD-10-CM | POA: Insufficient documentation

## 2022-12-11 DIAGNOSIS — F319 Bipolar disorder, unspecified: Secondary | ICD-10-CM | POA: Insufficient documentation

## 2022-12-11 DIAGNOSIS — M533 Sacrococcygeal disorders, not elsewhere classified: Secondary | ICD-10-CM | POA: Insufficient documentation

## 2022-12-11 DIAGNOSIS — Z87891 Personal history of nicotine dependence: Secondary | ICD-10-CM | POA: Diagnosis present

## 2022-12-11 DIAGNOSIS — M45 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: Secondary | ICD-10-CM | POA: Insufficient documentation

## 2022-12-11 DIAGNOSIS — M24522 Contracture, left elbow: Secondary | ICD-10-CM | POA: Insufficient documentation

## 2022-12-11 DIAGNOSIS — M5136 Other intervertebral disc degeneration, lumbar region: Secondary | ICD-10-CM | POA: Diagnosis present

## 2022-12-11 DIAGNOSIS — B372 Candidiasis of skin and nail: Secondary | ICD-10-CM | POA: Diagnosis present

## 2022-12-11 MED ORDER — CERTOLIZUMAB PEGOL 2 X 200 MG ~~LOC~~ KIT
400.0000 mg | PACK | Freq: Once | SUBCUTANEOUS | Status: AC
  Administered 2022-12-11: 400 mg via SUBCUTANEOUS

## 2022-12-11 NOTE — Progress Notes (Signed)
Subjective:   Patient presents to clinic today to receive monthly dose of Cimzia.  Patient running a fever or have signs/symptoms of infection? No  Patient currently on antibiotics for the treatment of infection? No  Patient have any upcoming invasive procedures/surgeries? No  Objective: CMP     Component Value Date/Time   NA 139 10/17/2022 1519   K 4.7 10/17/2022 1519   CL 103 10/17/2022 1519   CO2 26 10/17/2022 1519   GLUCOSE 149 (H) 10/17/2022 1519   BUN 12 10/17/2022 1519   CREATININE 0.97 10/17/2022 1519   CALCIUM 10.0 10/17/2022 1519   PROT 6.6 10/17/2022 1519   ALBUMIN 4.2 10/30/2020 1227   AST 14 10/17/2022 1519   ALT 18 10/17/2022 1519   ALKPHOS 144 (H) 10/30/2020 1227   BILITOT 0.3 10/17/2022 1519   GFRNONAA >60 12/29/2021 0320   GFRNONAA 47 (L) 04/11/2021 1419   GFRAA 54 (L) 04/11/2021 1419    CBC    Component Value Date/Time   WBC 7.7 10/17/2022 1519   RBC 4.44 10/17/2022 1519   HGB 12.5 10/17/2022 1519   HCT 38.1 10/17/2022 1519   PLT 266 10/17/2022 1519   MCV 85.8 10/17/2022 1519   MCH 28.2 10/17/2022 1519   MCHC 32.8 10/17/2022 1519   RDW 13.6 10/17/2022 1519   LYMPHSABS 1,679 10/17/2022 1519   MONOABS 0.4 10/30/2020 1227   EOSABS 123 10/17/2022 1519   BASOSABS 31 10/17/2022 1519    Baseline Immunosuppressant Therapy Labs TB GOLD    Latest Ref Rng & Units 04/18/2022    3:19 PM  Quantiferon TB Gold  Quantiferon TB Gold Plus NEGATIVE NEGATIVE    Hepatitis Panel    Latest Ref Rng & Units 04/26/2020   11:26 AM  Hepatitis  Hep B Surface Ag NON-REACTI NON-REACTIVE   Hep B IgM NON-REACTI NON-REACTIVE    HIV Lab Results  Component Value Date   HIV NON-REACTIVE 04/26/2020   HIV Non Reactive 08/23/2017   HIV Non Reactive 04/14/2017   Immunoglobulins    Latest Ref Rng & Units 04/26/2020   11:26 AM  Immunoglobulin Electrophoresis  IgA  70 - 320 mg/dL 120   IgG 600 - 1,540 mg/dL 824   IgM 50 - 300 mg/dL 47    SPEP    Latest Ref Rng &  Units 10/17/2022    3:19 PM  Serum Protein Electrophoresis  Total Protein 6.1 - 8.1 g/dL 6.6    G6PD No results found for: "G6PDH" TPMT No results found for: "TPMT"   Chest x-ray: 02/01/2022 Chronic appearing increased interstitial lung markings without evidence of acute or active cardiopulmonary disease    Assessment/Plan:   Administrations This Visit     certolizumab pegol (CIMZIA) kit 400 mg     Admin Date 12/11/2022 Action Given Dose 400 mg Route Subcutaneous Administered By Carole Binning, LPN             Patient tolerated injection well.   Appointment for next injection scheduled for 01/08/2023.  Patient due for labs in April 2024.  Patient is to call and reschedule appointment if running a fever with signs/symptoms of infection, on antibiotics for active infection or has an upcoming invasive procedure.  All questions encouraged and answered.  Instructed patient to call with any further questions or concerns.

## 2022-12-11 NOTE — Patient Instructions (Signed)
Standing Labs We placed an order today for your standing lab work.   Please have your standing labs drawn in April and every 3 months   Please have your labs drawn 2 weeks prior to your appointment so that the provider can discuss your lab results at your appointment, if possible.  Please note that you may see your imaging and lab results in [facility] before we have reviewed them. We will contact you once all results are reviewed. Please allow our office up to 72 hours to thoroughly review all of the results before contacting the office for clarification of your results.  WALK-IN LAB HOURS  Monday through Thursday from 8:00 am -12:30 pm and 1:00 pm-5:00 pm and Friday from 8:00 am-12:00 pm.  Patients with office visits requiring labs will be seen before walk-in labs.  You may encounter longer than normal wait times. Please allow additional time. Wait times may be shorter on  Monday and Thursday afternoons.  We do not book appointments for walk-in labs. We appreciate your patience and understanding with our staff.   Labs are drawn by [facility]. Please bring your co-pay at the time of your lab draw.  You may receive a bill from [facility] for your lab work.  Please note if you are on Hydroxychloroquine and and an order has been placed for a Hydroxychloroquine level,  you will need to have it drawn 4 hours or more after your last dose.  If you wish to have your labs drawn at another location, please call the office 24 hours in advance so we can fax the orders.  The office is located at [address] [place], [place], [place] [address]   If you have any questions regarding directions or hours of operation,  please call [phone].   As a reminder, please drink plenty of water prior to coming for your lab work. Thanks!

## 2022-12-23 ENCOUNTER — Ambulatory Visit (INDEPENDENT_AMBULATORY_CARE_PROVIDER_SITE_OTHER): Payer: Medicare Other | Admitting: Family Medicine

## 2022-12-23 ENCOUNTER — Encounter: Payer: Self-pay | Admitting: Family Medicine

## 2022-12-23 VITALS — BP 122/62 | HR 70 | Temp 98.1°F | Ht 70.0 in | Wt 229.9 lb

## 2022-12-23 DIAGNOSIS — G2581 Restless legs syndrome: Secondary | ICD-10-CM | POA: Diagnosis not present

## 2022-12-23 DIAGNOSIS — R0609 Other forms of dyspnea: Secondary | ICD-10-CM

## 2022-12-23 DIAGNOSIS — R7303 Prediabetes: Secondary | ICD-10-CM

## 2022-12-23 LAB — POCT GLYCOSYLATED HEMOGLOBIN (HGB A1C): Hemoglobin A1C: 6.2 % — AB (ref 4.0–5.6)

## 2022-12-23 MED ORDER — ROPINIROLE HCL 1 MG PO TABS: ORAL_TABLET | ORAL | 1 refills | Status: DC

## 2022-12-23 MED ORDER — ROPINIROLE HCL 1 MG PO TABS: 2.0000 mg | ORAL_TABLET | Freq: Every day | ORAL | 1 refills | Status: DC

## 2022-12-23 NOTE — Assessment & Plan Note (Addendum)
Better sx at night on the 2 mg at bedtime, however she is very restless and fidgeting in the visit today, states that someone mentioned tardive dyskinesia to her in the past, possibly due to her seroquel?? I will increase the requip by adding another 1/2 tablet in the morning. I do think that she may need to see neurology for recommendations. Pt allergic to gabapentin. Failed pramipexole.

## 2022-12-23 NOTE — Progress Notes (Signed)
Established Patient Office Visit  Subjective   Patient ID: Judy [name], female    DOB: Oct 13, 1950  Age: 72 y.o. MRN: [id number]  Chief Complaint  Patient presents with   Medical Management of Chronic Issues    Pt is here for follow up.  Restless leg-- she is up to 2 mg at bedtime of the requip, states that it is helping some but she is still "wiggling" at night. States that she is also restless during the day as well. States that she "wring's" her hands and feels like during the day is when her symptoms are worse. We discussed that she may need referral to neurology to look for other symptoms.  Shortness of breath-- pt has been to the pulmonologist and is on her inhalers, however she continues to have SOB with exertion. States that her tolerance for exercise is very limited, states that with activity she gets very SOB. States that she has never had a cardiac work up.    Current Outpatient Medications  Medication Instructions   atorvastatin (LIPITOR) 40 mg, Oral, Daily   Biotin 10000 MCG TABS Oral   Calcium-Phosphorus-Vitamin D (CITRACAL +D3 PO) 1 tablet, Oral, Daily, Calcium 650 mg & vitamin D 1000 units   celecoxib (CELEBREX) 200 mg, Oral, Daily   Certolizumab Pegol (CIMZIA [place]) Subcutaneous, Every 28 days   Cholecalciferol (VITAMIN D3) 50 MCG (2000 UT) TABS 1 tablet, Oral, Every morning   Cyanocobalamin (VITAMIN B-12 PO) 2,500 mcg, Oral, Every morning   Fluticasone-Umeclidin-Vilant (TRELEGY ELLIPTA) 100-62.5-25 MCG/ACT AEPB 1 puff, Inhalation, Daily   levalbuterol (XOPENEX HFA) 45 MCG/ACT inhaler 1 puff, Inhalation, Every 6 hours PRN   LORazepam (ATIVAN) 1 mg, Oral, As needed   Misc Natural Products (NEURIVA) CAPS Oral   QUEtiapine (SEROQUEL) 400 mg, Oral, Daily at bedtime   rOPINIRole (REQUIP) 1 MG tablet 1/2 tablet in the morning, and 2 tablets at bedtime.    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     Objective:     BP 122/62 (BP Location: Left Arm, Patient Position: Sitting, Cuff Size: Large)   Pulse 70   Temp 98.1 F (36.7 C) (Oral)   Ht 5\' 10"  (1.778 m)   Wt 229 lb 14.4 oz (104.3 kg)   SpO2 99%   BMI 32.99 kg/m    Physical Exam Vitals reviewed.  Constitutional:      Appearance: Normal appearance. She is well-groomed. She is obese.  Eyes:     Conjunctiva/sclera: Conjunctivae normal.  Cardiovascular:     Rate and Rhythm: Normal rate and regular rhythm.     Pulses: Normal pulses.     Heart sounds: S1 normal and S2 normal.  Pulmonary:     Effort: Pulmonary effort is normal.     Breath sounds: Normal breath sounds and air entry.  Abdominal:  General: Bowel sounds are normal.  Musculoskeletal:     Right lower leg: No edema.     Left lower leg: No edema.  Neurological:     Mental Status: She is alert and oriented to person, place, and time. Mental status is at baseline.     Gait: Gait is intact.  Psychiatric:        Mood and Affect: Mood and affect normal.        Speech: Speech normal.        Behavior: Behavior normal.        Judgment: Judgment normal.      Results for orders placed or performed in visit on 12/23/22  POC HgB A1c  Result Value Ref Range   Hemoglobin A1C 6.2 (A) 4.0 - 5.6 %   HbA1c POC (<> result, manual entry)     HbA1c, POC (prediabetic range)     HbA1c, POC (controlled diabetic  range)        The 10-year ASCVD risk score (Arnett DK, et al., 2019) is: 15.3%    Assessment & Plan:   Problem List Items Addressed This Visit       Unprioritized   Prediabetes - Primary    A1C is stable at 6.2, not currently on medication for this, will continue to monitor every 6 months.       Relevant Orders   POC HgB A1c (Completed)   Restless legs syndrome    Better sx at night on the 2 mg at bedtime, however she is very restless and fidgeting in the visit today, states that someone mentioned tardive dyskinesia to her in the past, possibly due to her seroquel?? I will increase the requip by adding another 1/2 tablet in the morning. I do think that she may need to see neurology for recommendations. Pt allergic to gabapentin. Failed pramipexole.      Relevant Medications   rOPINIRole (REQUIP) 1 MG tablet   Other Visit Diagnoses     Dyspnea on exertion       Had pulmonary work up which showed mild COPD findings, still getting DOE, will send to cardiology to rule out a cardiac cause.   Relevant Orders   Ambulatory referral to Cardiology       Return in about 6 months (around 06/25/2023) for  .    Farrel Conners, MD

## 2022-12-23 NOTE — Assessment & Plan Note (Signed)
A1C is stable at 6.2, not currently on medication for this, will continue to monitor every 6 months.

## 2022-12-24 ENCOUNTER — Telehealth: Payer: Self-pay | Admitting: Family Medicine

## 2022-12-24 NOTE — Telephone Encounter (Signed)
She takes 1/2 tablet in the morning and 2 tablets of the 1 mg tablets-- the one I sent her in yesterday is what she should be taking

## 2022-12-24 NOTE — Telephone Encounter (Signed)
Patient informed of the message below.

## 2022-12-24 NOTE — Telephone Encounter (Signed)
Pt was just seen by MD on 12/23/22.  Pt states regarding the rOPINIRole (REQUIP) 1 MG tablet   Pt has 2 bottles -  One says 1/2 mg, and the other says 1 mg  Pt states she should be taking 2 - 3 mg, as per discussion had yesterday.  Please advise.  [place], [place] - [id number] Phone: [phone]  Fax: [phone]

## 2022-12-27 ENCOUNTER — Other Ambulatory Visit: Payer: Self-pay | Admitting: Physical Medicine and Rehabilitation

## 2023-01-03 ENCOUNTER — Telehealth: Payer: Self-pay

## 2023-01-03 NOTE — Telephone Encounter (Signed)
   CCM RN Visit Note   01/03/23 Name: [name] MRN: [id number]      DOB: July 30, 1951  Subjective: Judy [name] is a 72 y.o. year old female who is a primary care patient of Farrel Conners, MD The patient was referred to the Chronic Care Management team for assistance with care management needs subsequent to provider initiation of CCM services and plan of care.      Brief outreach regarding need for CCM follow-up. [name] anticipates being available for telephonic outreach on 01/09/23 at 1600. Agreed to call if assistance is required prior to call.  PLAN: Will follow up on 01/09/23   [name] RN Care Manager/Chronic Care Management [phone]

## 2023-01-08 ENCOUNTER — Ambulatory Visit: Payer: Medicare Other | Attending: Rheumatology | Admitting: *Deleted

## 2023-01-08 ENCOUNTER — Other Ambulatory Visit: Payer: Self-pay

## 2023-01-08 ENCOUNTER — Ambulatory Visit (INDEPENDENT_AMBULATORY_CARE_PROVIDER_SITE_OTHER): Payer: Medicare Other | Admitting: Orthopaedic Surgery

## 2023-01-08 ENCOUNTER — Encounter: Payer: Self-pay | Admitting: Orthopaedic Surgery

## 2023-01-08 VITALS — BP 144/110 | HR 84

## 2023-01-08 DIAGNOSIS — M45 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: Secondary | ICD-10-CM | POA: Diagnosis present

## 2023-01-08 DIAGNOSIS — M47816 Spondylosis without myelopathy or radiculopathy, lumbar region: Secondary | ICD-10-CM | POA: Diagnosis not present

## 2023-01-08 DIAGNOSIS — M545 Low back pain, unspecified: Secondary | ICD-10-CM

## 2023-01-08 DIAGNOSIS — Z79899 Other long term (current) drug therapy: Secondary | ICD-10-CM | POA: Insufficient documentation

## 2023-01-08 DIAGNOSIS — G8929 Other chronic pain: Secondary | ICD-10-CM

## 2023-01-08 MED ORDER — PREDNISONE 50 MG PO TABS: ORAL_TABLET | ORAL | 0 refills | Status: DC

## 2023-01-08 MED ORDER — CERTOLIZUMAB PEGOL 2 X 200 MG ~~LOC~~ KIT
400.0000 mg | PACK | Freq: Once | SUBCUTANEOUS | Status: AC
Start: 2023-01-08 — End: 2023-01-08
  Administered 2023-01-08: 400 mg via SUBCUTANEOUS

## 2023-01-08 NOTE — Progress Notes (Signed)
The patient comes in today for follow-up at her having bilateral L3-L4 facet joint injections by Dr. Ernestina Patches on February 26.  She said those were great for about 2 weeks but now she is having pain in her back again.  Celebrex gave her no relief.  She does see her rheumatologist later today.  She says she has been really hurting a lot all over but her back is giving her hard time.  She still does seem to have low back pain across her facet joints.  Her left operative knee has excellent range of motion which is some slight warmth.  This was a total knee replacement.  There is no instability on exam and no evidence of infection.  I would like to at least try 5 days of prednisone 50 mg for her.  We can certainly reach out to Dr. Ernestina Patches to see if she is a candidate for bilateral L3-L4 RF for the facet joints.  We will be in touch with her about that if it is a possibility.  There is nothing else that I can offer from that standpoint other than anti-inflammatories and therapy.  Her rheumatologist may be helpful as well and helping with assessing her pain and treatment for that other than narcotics.

## 2023-01-08 NOTE — Progress Notes (Signed)

## 2023-01-09 ENCOUNTER — Ambulatory Visit (INDEPENDENT_AMBULATORY_CARE_PROVIDER_SITE_OTHER): Payer: Medicare Other

## 2023-01-09 ENCOUNTER — Telehealth: Payer: Self-pay | Admitting: *Deleted

## 2023-01-09 DIAGNOSIS — G8929 Other chronic pain: Secondary | ICD-10-CM

## 2023-01-09 DIAGNOSIS — M4807 Spinal stenosis, lumbosacral region: Secondary | ICD-10-CM

## 2023-01-09 DIAGNOSIS — J441 Chronic obstructive pulmonary disease with (acute) exacerbation: Secondary | ICD-10-CM

## 2023-01-09 DIAGNOSIS — M45 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: Secondary | ICD-10-CM

## 2023-01-09 DIAGNOSIS — Z79899 Other long term (current) drug therapy: Secondary | ICD-10-CM

## 2023-01-09 DIAGNOSIS — M79641 Pain in right hand: Secondary | ICD-10-CM

## 2023-01-09 DIAGNOSIS — M24521 Contracture, right elbow: Secondary | ICD-10-CM

## 2023-01-09 DIAGNOSIS — M5136 Other intervertebral disc degeneration, lumbar region: Secondary | ICD-10-CM

## 2023-01-09 DIAGNOSIS — R7303 Prediabetes: Secondary | ICD-10-CM

## 2023-01-09 LAB — CBC WITH DIFFERENTIAL/PLATELET
Absolute Monocytes: 734 cells/uL (ref 200–950)
Basophils Absolute: 10 cells/uL (ref 0–200)
Basophils Relative: 0.2 %
Eosinophils Absolute: 10 cells/uL — ABNORMAL LOW (ref 15–500)
Eosinophils Relative: 0.2 %
HCT: 36.6 % (ref 35.0–45.0)
Hemoglobin: 12.1 g/dL (ref 11.7–15.5)
Lymphs Abs: 902.7 cells/uL (ref 850–3900)
MCH: 28.2 pg (ref 27.0–33.0)
MCHC: 33.1 g/dL (ref 32.0–36.0)
MCV: 85.3 fL (ref 80.0–100.0)
MPV: 9.9 fL (ref 7.5–12.5)
Monocytes Relative: 14.4 %
Neutro Abs: 3443 cells/uL (ref 1500–7800)
Neutrophils Relative %: 67.5 %
Platelets: 229 10*3/uL (ref 140–400)
RBC: 4.29 10*6/uL (ref 3.80–5.10)
RDW: 14.1 % (ref 11.0–15.0)
Total Lymphocyte: 17.7 %
WBC: 5.1 10*3/uL (ref 3.8–10.8)

## 2023-01-09 LAB — COMPLETE METABOLIC PANEL WITH GFR
AG Ratio: 1.6 (calc) (ref 1.0–2.5)
ALT: 49 U/L — ABNORMAL HIGH (ref 6–29)
AST: 36 U/L — ABNORMAL HIGH (ref 10–35)
Albumin: 4.2 g/dL (ref 3.6–5.1)
Alkaline phosphatase (APISO): 160 U/L — ABNORMAL HIGH (ref 37–153)
BUN/Creatinine Ratio: 14 (calc) (ref 6–22)
BUN: 18 mg/dL (ref 7–25)
CO2: 22 mmol/L (ref 20–32)
Calcium: 9.7 mg/dL (ref 8.6–10.4)
Chloride: 105 mmol/L (ref 98–110)
Creat: 1.27 mg/dL — ABNORMAL HIGH (ref 0.60–1.00)
Globulin: 2.7 g/dL (calc) (ref 1.9–3.7)
Glucose, Bld: 99 mg/dL (ref 65–99)
Potassium: 4.6 mmol/L (ref 3.5–5.3)
Sodium: 137 mmol/L (ref 135–146)
Total Bilirubin: 0.4 mg/dL (ref 0.2–1.2)
Total Protein: 6.9 g/dL (ref 6.1–8.1)
eGFR: 45 mL/min/{1.73_m2} — ABNORMAL LOW (ref >=60)

## 2023-01-09 NOTE — Telephone Encounter (Signed)
-----   Message from Ofilia Neas, PA-C sent at 01/09/2023  7:07 AM EDT ----- Absolute eosinophils are borderline low. Rest of CBC WNL  Creatinine is elevated-1.27 and GFR is low-45--likely due to use of celebrex.  Alk phos is borderline elevated-160. AST and ALT are elevated.   Please clarify how she has been taking celebrex?  Recommend avoiding the use of tylenol, alcohol, and NSAID use for now.  Please offer referral to pain management to discuss other options for pain management.   Recheck CMP in 1 month.

## 2023-01-09 NOTE — Chronic Care Management (AMB) (Signed)
Chronic Care Management   CCM RN Visit Note  01/09/2023 Name: Judy [name] MRN: [id number] DOB: Jan 14, 1951  Subjective: Judy [name] is a 72 y.o. year old female who is a primary care patient of No primary care provider on file.. The patient was referred to the Chronic Care Management team for assistance with care management needs subsequent to provider initiation of CCM services and plan of care.    Today's Visit:  Engaged with patient by telephone for follow up visit.        Goals Addressed             This Visit's Progress    Goal: CCM (COPD) Expected Outcome:  Monitor, Self-Manage And Reduce Symptoms of COPD       Current Barriers:  Chronic Disease Management support and education needs related to COPD  Planned Interventions: Reviewed provider's plan for COPD management.  Reviewed medications. Reports consistently using inhaler as prescribed.  Reviewed symptoms. Reports symptoms have been well controlled with prescribed inhalers. Reports recently experiencing a severe cold, cough and sinus congestion. Reports cold symptoms and congestion has cleared. Reports cough has resolved. Reports oxygen saturations have remained stable in the high 90s. Reviewed importance of daily self-assessment. Reminded of need to make an appointment if experiencing moderate symptoms for greater than 48 hours without improvement.  Discussed activity tolerance. Reports going to the gym approximately three days a week. Reports engaging in low impact exercises with complications. Reviewed information regarding infection prevention and increased risk r/t COPD. Advised to utilize prevention strategies to reduce risk of respiratory infection.  Anticipates follow-up with Dr. Mannam/Pulmonology in May or June. Pending call from Pulmonology staff to confirm schedule. Reviewed worsening symptoms that require immediate medical attention.     Symptom Management: Complete appointments with the  medical team as scheduled Take medications and use inhalers as prescribed Assess symptoms daily Notify provider if in the yellow zone for 48 hrs without improvement Follow recommendations to prevent respiratory infection Notify provider or care management team with questions and new concerns as needed   Follow Up Plan:  Will follow up next month      Goal: CCM (Pre-Diabetes) Expected Outcome:  Monitor, Self-Manage And Reduce Symptoms of Pre-Diabetes       Current Barriers:  Chronic Disease Management support and education needs related to Pre-Diabetes  Planned Interventions: Reviewed provider's plan for Pre-Diabetes/Hyperglycemia.  Reviewed nutritional intake. Reports decreased intake over the past three days d/t decreased appetite and diarrhea. Denies nausea, vomiting or stomach pains. Denies blood or unusual color in stool. Reports very limited intake of solid foods. Reports attempting to maintain adequate hydration. Reports using Imodium without improvement. Would like to discuss further with her provider.  Collaborated with the clinic team. Her assigned PCP is out of the office. Team arranged a virtual visit with a covering provider on 01/10/23. Thorough discussion regarding worsening symptoms that require immediate medical attention.    Lab Results  Component Value Date   HGBA1C 6.4 06/27/2022    Symptom Management: Complete virtual visit as scheduled on 01/10/23 Continue engaging in consistent exercise programs Read food labels for fat, fiber, carbohydrates and portion size Complete labs as ordered Call provider office for new concerns or questions    Follow Up Plan:  Will follow up next month             PLAN: Will follow up next month  Katha Cabal RN Care Manager/Chronic Care Management [phone]

## 2023-01-09 NOTE — Progress Notes (Signed)
Absolute eosinophils are borderline low. Rest of CBC WNL  Creatinine is elevated-1.27 and GFR is low-45--likely due to use of celebrex.  Alk phos is borderline elevated-160. AST and ALT are elevated.   Please clarify how she has been taking celebrex?  Recommend avoiding the use of tylenol, alcohol, and NSAID use for now.  Please offer referral to pain management to discuss other options for pain management.   Recheck CMP in 1 month.

## 2023-01-10 ENCOUNTER — Encounter: Payer: Medicare Other | Admitting: Family Medicine

## 2023-01-10 ENCOUNTER — Encounter: Payer: Self-pay | Admitting: Family Medicine

## 2023-01-13 ENCOUNTER — Encounter: Payer: Self-pay | Admitting: Family Medicine

## 2023-01-13 ENCOUNTER — Ambulatory Visit (INDEPENDENT_AMBULATORY_CARE_PROVIDER_SITE_OTHER): Payer: Medicare Other | Admitting: Family Medicine

## 2023-01-13 VITALS — BP 120/78 | HR 76 | Temp 98.0°F | Wt 227.0 lb

## 2023-01-13 DIAGNOSIS — K529 Noninfective gastroenteritis and colitis, unspecified: Secondary | ICD-10-CM | POA: Diagnosis not present

## 2023-01-13 NOTE — Progress Notes (Signed)
   Established Patient Office Visit   Subjective  Patient ID: Judy [name], female    DOB: Aug 13, 1951  Age: 72 y.o. MRN: [id number]  Chief Complaint  Patient presents with   Diarrhea    Patient is a 72 year old female followed by Dr. Casimiro Needle and seen for acute concern.  Patient endorses diarrhea, nausea x 4 days.  Initially scheduled for virtual visit on 01/10/2023 however was unable to connect.  Advised to schedule appointment for today.  Patient notes symptoms resolved yesterday.  Tried Pepto-Bismol without relief.  Imodium A-D provided relief.  Patient notes several family and church members had similar symptoms.  Appetite slowly coming back.  Staying hydrated.  Diarrhea       Review of Systems  Gastrointestinal:  Positive for diarrhea.   Negative unless stated above    Objective:     BP 120/78 (BP Location: Left Arm, Patient Position: Sitting, Cuff Size: Large)   Pulse 76   Temp 98 F (36.7 C) (Oral)   Wt 227 lb (103 kg)   SpO2 98%   BMI 32.57 kg/m    Physical Exam Constitutional:      General: She is not in acute distress.    Appearance: Normal appearance.  HENT:     Head: Normocephalic and atraumatic.     Nose: Nose normal.     Mouth/Throat:     Mouth: Mucous membranes are moist.  Cardiovascular:     Rate and Rhythm: Normal rate and regular rhythm.     Heart sounds: Normal heart sounds. No murmur heard.    No gallop.  Pulmonary:     Effort: Pulmonary effort is normal. No respiratory distress.     Breath sounds: Normal breath sounds. No wheezing, rhonchi or rales.  Abdominal:     General: Bowel sounds are normal. There is no distension.     Palpations: Abdomen is soft.     Tenderness: There is no abdominal tenderness. There is no guarding.  Skin:    General: Skin is warm and dry.  Neurological:     Mental Status: She is alert and oriented to person, place, and time.      No results found for any visits on 01/13/23.    Assessment & Plan:   Gastroenteritis  Resolved.  Likely 2/2 viral etiology.  Hand hygiene discussed.  Continue OTC Imodium as needed.  Advance diet as tolerated.  Given strict precautions.  Return if symptoms worsen or fail to improve.   Deeann Saint, MD

## 2023-01-14 ENCOUNTER — Encounter: Payer: Self-pay | Admitting: Physical Medicine and Rehabilitation

## 2023-01-14 ENCOUNTER — Ambulatory Visit (INDEPENDENT_AMBULATORY_CARE_PROVIDER_SITE_OTHER): Payer: Medicare Other | Admitting: Physical Medicine and Rehabilitation

## 2023-01-14 DIAGNOSIS — M47816 Spondylosis without myelopathy or radiculopathy, lumbar region: Secondary | ICD-10-CM

## 2023-01-14 DIAGNOSIS — M45 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: Secondary | ICD-10-CM | POA: Diagnosis not present

## 2023-01-14 DIAGNOSIS — G894 Chronic pain syndrome: Secondary | ICD-10-CM

## 2023-01-14 DIAGNOSIS — M545 Low back pain, unspecified: Secondary | ICD-10-CM | POA: Diagnosis not present

## 2023-01-14 DIAGNOSIS — G8929 Other chronic pain: Secondary | ICD-10-CM

## 2023-01-14 NOTE — Progress Notes (Unsigned)
Functional Pain Scale - descriptive words and definitions  Unmanageable (7)  Pain interferes with normal ADL's/nothing seems to help/sleep is very difficult/active distractions are very difficult to concentrate on. Severe range order  Average Pain  varies  Lower back pain with no radiation

## 2023-01-14 NOTE — Progress Notes (Unsigned)
Judy [name] - 72 y.o. female MRN [id number]  Date of birth: 1951-08-07  Office Visit Note: Visit Date: 01/14/2023 PCP: Karie Georges, MD Referred by: Karie Georges, MD  Subjective: Chief Complaint  Patient presents with   Lower Back - Pain   HPI: Judy [name] is a 72 y.o. female who comes in today per the request of Dr. Doneen Poisson for evaluation of chronic, worsening and severe bilateral lower back pain. Pain ongoing for several years, worsens with prolonged sitting, standing and walking. She describes pain as sharp, sore, aching and throbbing sensation, currently rates as 7 out of 10. Pain becomes severe when laying flat. No relief of pain with formal physical therapy, chiropractic treatments, home exercise regimen and medications. No relief of pain with recent trial of Celebrex. Recent lumbar MRI imaging exhibits severe facet arthropathy with suspected medially oriented facet synovial cyst at L3-L4. There is also left paracentral disc extrusion with 12 mm of caudal migration and mild to moderate spinal canal stenosis at this level. Radiologist report did note increased T2 signal in the T12-L1 disc that could be related to possible discitis/osteomyelitis. Per Dr. Eliberto Ivory notes her clinical presentation was not consistent with discitis. Patient underwent bilateral L3-L4 facet joint injections in our office on 12/02/2022, she reports greater then 80% relief of pain with this procedure for roughly 2 weeks. Also reports increased functional ability post injections. Patient has long standing history of chronic pain. Does carry diagnosis of ankylosing spondylitis, managed by Dr. Huey Bienenstock with [facility]. Patient was recently referred to [facility] by rheumatology. Patient denies focal weakness, numbness and tingling. No recent trauma or falls.     Oswestry Disability Index Score 64% 30 to 40 (80%): very severe disability: Back pain  impinges on all aspects of the patient's life. Positive intervention is required.  Review of Systems  Musculoskeletal:  Positive for back pain, joint pain and myalgias.  Neurological:  Negative for tingling, sensory change, focal weakness and weakness.  All other systems reviewed and are negative.  Otherwise per HPI.  Assessment & Plan: Visit Diagnoses:    ICD-10-CM   1. Chronic bilateral low back pain without sciatica  M54.50 Ambulatory referral to Physical Medicine Rehab   G89.29     2. Spondylosis without myelopathy or radiculopathy, lumbar region  M47.816 Ambulatory referral to Physical Medicine Rehab    3. Facet hypertrophy of lumbar region  M47.816 Ambulatory referral to Physical Medicine Rehab    4. Chronic pain syndrome  G89.4 Ambulatory referral to Physical Medicine Rehab    5. 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  M45.0 Ambulatory referral to Physical Medicine Rehab       Plan: Findings:  Chronic, worsening and severe bilateral lower back pain.  Patient continues to have severe pain despite good conservative therapy such as formal physical therapy, chiropractic treatments, home exercise regimen and use of medications.  Patient's clinical presentation and exam are consistent with facet mediated pain.  Significant relief of pain with recent facet joint injections.  Next step is to perform diagnostic and hopefully therapeutic bilateral L3-L4 facet joint injections under fluoroscopic guidance.  If patient gets good relief of pain with second set of facet blocks we did discuss the possibility of longer sustained pain relief with radiofrequency ablation procedure.  I did discuss radiofrequency ablation procedure in detail today and provided patient with educational material to take home and review. RFA procedure can be repeated as pain relief  typically lasts from about 6 months to 1 year.  Patient instructed to continue with current medication regimen.  She is awaiting call  from [facility] to schedule appointment. No red flag symptoms noted upon exam today.     Meds & Orders: No orders of the defined types were placed in this encounter.   Orders Placed This Encounter  Procedures   Ambulatory referral to Physical Medicine Rehab    Follow-up: Return for Bilateral L3-L4 facet joint injections.   Procedures: No procedures performed      Clinical History: MRI LUMBAR SPINE WITHOUT CONTRAST   TECHNIQUE: Multiplanar, multisequence MR imaging of the lumbar spine was performed. No intravenous contrast was administered.   COMPARISON:  01/06/2020   FINDINGS: Segmentation:  5 lumbar type vertebral bodies.   Alignment:  Mild dextrocurvature.  No significant listhesis.   Vertebrae: No acute fracture or suspicious osseous lesion. Redemonstrated compression deformity of L1, which appears unchanged. Increased T2 signal in the T12-L1 disc, with some loss of cortical signal in the T12-L1 endplates (series 5, image 8-9).   Conus medullaris and cauda equina: Conus extends to the L2 level. Conus and cauda equina appear normal.   Paraspinal and other soft tissues: Atrophy of the right psoas muscle, which is new from the prior exam.   Disc levels:   T12-L1: Minimal disc bulge. Mild facet arthropathy. No spinal canal stenosis or neural foraminal narrowing.   L1-L2: Minimal disc bulge. No spinal canal stenosis or neural foraminal narrowing.   L2-L3: Minimal disc bulge. No spinal canal stenosis or neural foraminal narrowing.   L3-L4: Mild disc bulge, which has progressed from prior exam, with superimposed left paracentral disc extrusion with 12 mm of caudal migration, which is new from the prior exam. Severe facet arthropathy with suspected medially oriented facet synovial cyst, which measures 2 x 2 x 4 mm (series 13, image 26). Mild-to-moderate spinal canal stenosis. Narrowing of the lateral recesses. Mild left neural foraminal narrowing, which is  new from the prior exam.   L4-L5: Disc height loss with small left paracentral disc protrusion, which narrows the left lateral recess. No spinal canal stenosis or neural foraminal narrowing.   L5-S1: Large left lateral osteophyte, which may abut the exiting left L5 nerve. No significant disc bulge. Moderate facet arthropathy. No spinal canal stenosis or neural foraminal narrowing.   IMPRESSION: 1. Increased T2 signal in the T12-L1 disc, with some loss of cortical signal in the T12-L1 endplates. While this could be degenerative in nature, early discitis/osteomyelitis could have a similar appearance. Recommend correlation with inflammatory markers. 2. L3-L4 mild-to-moderate spinal canal stenosis and mild left neural foraminal narrowing, which is new from the prior exam. Narrowing of the lateral recesses at this level could affect the descending L4 nerve roots. 3. L4-L5 narrowing of the left lateral recess, which could affect the descending left L5 nerve. 4. L5-S1 large left lateral osteophyte, which may abut the exiting left L5 nerve. 5. Atrophy of the right psoas muscle, which is new from the prior exam.     Electronically Signed   By: Wiliam KeAlison  Vasan M.D.   On: 10/29/2022 12:33   She reports that she quit smoking about 2 years ago. Her smoking use included cigarettes. She has a 53.75 pack-year smoking history. She has never been exposed to tobacco smoke. She has never used smokeless tobacco.  Recent Labs    06/27/22 1723 12/23/22 1317  HGBA1C 6.4 6.2*    Objective:  VS:  HT:  WT:   BMI:     BP:   HR: bpm  TEMP: ( )  RESP:  Physical Exam Vitals and nursing note reviewed.  HENT:     Head: Normocephalic and atraumatic.     Right Ear: External ear normal.     Left Ear: External ear normal.     Nose: Nose normal.     Mouth/Throat:     Mouth: Mucous membranes are moist.  Eyes:     Extraocular Movements: Extraocular movements intact.  Cardiovascular:     Rate and  Rhythm: Normal rate.     Pulses: Normal pulses.  Pulmonary:     Effort: Pulmonary effort is normal.  Abdominal:     General: Abdomen is flat. There is distension.  Musculoskeletal:        General: Tenderness present.     Cervical back: Normal range of motion.     Comments: Patient is slow to rise from seated position to standing. Thoracic kyphosis noted. Concordant low back pain with facet loading, lumbar spine extension and rotation. 5/5 strength noted with bilateral hip flexion, knee flexion/extension, ankle dorsiflexion/plantarflexion and EHL. No clonus noted bilaterally. No pain upon palpation of greater trochanters. No pain with internal/external rotation of bilateral hips. Sensation intact bilaterally. Negative slump test bilaterally. Ambulates without aid, gait steady.      Skin:    General: Skin is warm and dry.     Capillary Refill: Capillary refill takes less than 2 seconds.  Neurological:     General: No focal deficit present.     Mental Status: She is alert and oriented to person, place, and time.  Psychiatric:        Mood and Affect: Mood normal.        Behavior: Behavior normal.     Ortho Exam  Imaging: No results found.  Past Medical/Family/Surgical/Social History: Medications & Allergies reviewed per EMR, new medications updated. 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 11/30/2020   Status post total replacement of right hip 06/04/2019   Status post total replacement of left hip 03/19/2019   PTSD (post-traumatic stress disorder)    COPD (chronic obstructive pulmonary disease)    Bipolar 1 disorder    Anxiety    B12 deficiency 12/09/2018   Hyperglycemia 04/14/2017   Past Medical History:  Diagnosis Date   Anxiety    Arthritis     Avascular necrosis    Left hip   Avascular necrosis of bone of hip, left 12/28/2018   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   Bipolar 1 disorder    COPD (chronic obstructive pulmonary disease)    Depression    Dyspnea    Pneumonia    PTSD (post-traumatic stress disorder)    Family History  Problem Relation Age of Onset   CAD Mother 70   COPD Mother    Depression Mother    Heart attack Father    Colon polyps Sister    Cancer Maternal Aunt        growth on face- underwent chemo   Heart attack Maternal Grandmother    Early death Maternal Grandfather    Hearing loss Paternal Grandfather    Asthma Daughter    Bipolar disorder Son    Past Surgical History:  Procedure Laterality Date   bone spurs foot Right    CATARACT EXTRACTION Bilateral    COLONOSCOPY     COLONOSCOPY  07/2022   HAND SURGERY Left    JOINT REPLACEMENT     TONSILLECTOMY     TONSILLECTOMY AND ADENOIDECTOMY     TOTAL HIP ARTHROPLASTY Left 03/19/2019   Procedure: LEFT TOTAL HIP ARTHROPLASTY ANTERIOR APPROACH;  Surgeon: [name], MD;  Location: [facility];  Service: Orthopedics;  Laterality: Left;   TOTAL HIP ARTHROPLASTY Right 06/04/2019   Procedure: RIGHT TOTAL HIP ARTHROPLASTY ANTERIOR APPROACH;  Surgeon: [name], MD;  Location: [facility];  Service: Orthopedics;  Laterality: Right;   TOTAL KNEE ARTHROPLASTY Left 12/28/2021   Procedure: Left TOTAL KNEE ARTHROPLASTY;  Surgeon: [name], MD;  Location: [facility];  Service: Orthopedics;  Laterality: Left;   Social History   Occupational History   Occupation: retired  Tobacco Use   Smoking status: Former    Packs/day: 1.25    Years: 43.00    Additional pack years: 0.00    Total pack years: 53.75    Types: Cigarettes    Quit date: 05/19/2020    Years since quitting: 2.6    Passive exposure: Never   Smokeless tobacco: Never  Vaping Use   Vaping Use: Never used  Substance and Sexual Activity   Alcohol use:  Yes    Comment: occ   Drug use: No   Sexual activity: Not on file

## 2023-01-15 NOTE — Progress Notes (Signed)
Error - unable to connect. Patient had in office visit Monday.

## 2023-01-22 ENCOUNTER — Telehealth: Payer: Self-pay | Admitting: Physical Medicine and Rehabilitation

## 2023-01-22 NOTE — Telephone Encounter (Signed)
Patient called. Says she needs an appointment with Dr. Alvester Morin

## 2023-01-22 NOTE — Telephone Encounter (Signed)
Spoke with patient and scheduled injection for 02/03/23. Patient aware driver needed

## 2023-01-23 ENCOUNTER — Ambulatory Visit
Admission: RE | Admit: 2023-01-23 | Discharge: 2023-01-23 | Disposition: A | Discharge status: Home / Self Care | Payer: Medicare Other | Source: Ambulatory Visit | Attending: Family Medicine | Admitting: Family Medicine

## 2023-01-23 DIAGNOSIS — E2839 Other primary ovarian failure: Secondary | ICD-10-CM

## 2023-02-03 ENCOUNTER — Ambulatory Visit (INDEPENDENT_AMBULATORY_CARE_PROVIDER_SITE_OTHER): Payer: Medicare Other | Admitting: Physical Medicine and Rehabilitation

## 2023-02-03 ENCOUNTER — Ambulatory Visit: Payer: Medicare Other | Attending: Interventional Cardiology | Admitting: Interventional Cardiology

## 2023-02-03 ENCOUNTER — Encounter: Payer: Self-pay | Admitting: Interventional Cardiology

## 2023-02-03 ENCOUNTER — Encounter: Payer: Self-pay | Admitting: *Deleted

## 2023-02-03 ENCOUNTER — Ambulatory Visit: Payer: Self-pay

## 2023-02-03 VITALS — BP 140/78 | HR 73 | Ht 70.0 in | Wt 230.0 lb

## 2023-02-03 VITALS — BP 142/72 | HR 75

## 2023-02-03 DIAGNOSIS — Z8249 Family history of ischemic heart disease and other diseases of the circulatory system: Secondary | ICD-10-CM | POA: Insufficient documentation

## 2023-02-03 DIAGNOSIS — E782 Mixed hyperlipidemia: Secondary | ICD-10-CM | POA: Diagnosis present

## 2023-02-03 DIAGNOSIS — M47816 Spondylosis without myelopathy or radiculopathy, lumbar region: Secondary | ICD-10-CM

## 2023-02-03 DIAGNOSIS — R0600 Dyspnea, unspecified: Secondary | ICD-10-CM | POA: Insufficient documentation

## 2023-02-03 DIAGNOSIS — Z87891 Personal history of nicotine dependence: Secondary | ICD-10-CM | POA: Diagnosis present

## 2023-02-03 DIAGNOSIS — E669 Obesity, unspecified: Secondary | ICD-10-CM | POA: Insufficient documentation

## 2023-02-03 MED ORDER — METHYLPREDNISOLONE ACETATE 80 MG/ML IJ SUSP
80.0000 mg | Freq: Once | INTRAMUSCULAR | Status: AC
Start: 2023-02-03 — End: 2023-02-03
  Administered 2023-02-03: 80 mg

## 2023-02-03 NOTE — Progress Notes (Unsigned)
Functional Pain Scale - descriptive words and definitions  Distracting (5)    Aware of pain/able to complete some ADL's but limited by pain/sleep is affected and active distractions are only slightly useful. Moderate range order  Average Pain 2   +Driver, -BT, -Dye Allergies.  Lower back pain on both sides with no radiation to the legs

## 2023-02-03 NOTE — Patient Instructions (Signed)

## 2023-02-03 NOTE — Progress Notes (Signed)
Cardiology Office Note   Date:  02/03/2023   ID:  Judy [name], DOB 1951/03/20, MRN [id number]  PCP:  Judy Georges, MD    No chief complaint on file.  DOE  [employer] Readings from Last 3 Encounters:  02/03/23 230 lb (104.3 kg)  01/13/23 227 lb (103 kg)  12/23/22 229 lb 14.4 oz (104.3 kg)       History of Present Illness: Judy [name] is a 72 y.o. female who is being seen today for the evaluation of DOE at the request of Judy Georges, MD.   Knee replacement in 3/23; hip replacement in 2021  DOE over the past 3 months. No recent echo or CXR.    Quit smoking in 2021.  Smoked for 50+ years, 1-3 ppd. Gained weight.   Denies : Dizziness. Leg edema. Nitroglycerin use. Orthopnea. Palpitations. Paroxysmal nocturnal dyspnea. Syncope.    Past Medical History:  Diagnosis Date   Anxiety    Arthritis    Avascular necrosis (HCC)    Left hip   Avascular necrosis of bone of hip, 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   Bipolar 1 disorder (HCC)    COPD (chronic obstructive pulmonary disease) (HCC)    Depression    Dyspnea    Pneumonia    PTSD (post-traumatic stress disorder)     Past Surgical History:  Procedure Laterality Date   bone spurs foot Right    CATARACT EXTRACTION Bilateral    COLONOSCOPY     COLONOSCOPY  07/2022   HAND SURGERY Left    JOINT REPLACEMENT     TONSILLECTOMY     TONSILLECTOMY AND ADENOIDECTOMY     TOTAL HIP ARTHROPLASTY Left 03/19/2019   Procedure: LEFT TOTAL HIP ARTHROPLASTY ANTERIOR APPROACH;  Surgeon: [name], MD;  Location: [facility];  Service: Orthopedics;  Laterality: Left;   TOTAL HIP ARTHROPLASTY Right 06/04/2019   Procedure: RIGHT TOTAL HIP ARTHROPLASTY ANTERIOR APPROACH;  Surgeon: [name], MD;  Location: [facility];  Service: Orthopedics;  Laterality: Right;   TOTAL KNEE ARTHROPLASTY Left 12/28/2021   Procedure: Left TOTAL KNEE ARTHROPLASTY;  Surgeon:  [name], MD;  Location: [facility];  Service: Orthopedics;  Laterality: Left;     Current Outpatient Medications  Medication Sig Dispense Refill   atorvastatin (LIPITOR) 40 MG tablet Take 1 tablet (40 mg total) by mouth daily. 90 tablet 3   Biotin 78295 MCG TABS Take by mouth.     Calcium-Phosphorus-Vitamin D (CITRACAL +D3 PO) Take 1 tablet by mouth daily. Calcium 650 mg & vitamin D 1000 units     Certolizumab Pegol (CIMZIA [place]) Inject into the skin every 28 (twenty-eight) days.     Cholecalciferol (VITAMIN D3) 50 MCG (2000 UT) TABS Take 1 tablet by mouth in the morning.     Cyanocobalamin (VITAMIN B-12 PO) Take 2,500 mcg by mouth in the morning.     Fluticasone-Umeclidin-Vilant (TRELEGY ELLIPTA) 100-62.5-25 MCG/ACT AEPB Inhale 1 puff into the lungs daily. 180 each 3   levalbuterol (XOPENEX HFA) 45 MCG/ACT inhaler Inhale 1 puff into the lungs every 6 (six) hours as needed for wheezing or shortness of breath. 15 g 11   LORazepam (ATIVAN) 1 MG tablet Take 1 mg by mouth as needed.     Misc Natural Products (NEURIVA) CAPS Take by mouth.     QUEtiapine (SEROQUEL) 400 MG tablet Take 800 mg by mouth at bedtime.  rOPINIRole (REQUIP) 1 MG tablet 1/2 tablet in the morning, and 2 tablets at bedtime. 225 tablet 1   Current Facility-Administered Medications  Medication Dose Route Frequency Provider Last Rate Last Admin   methylPREDNISolone acetate (DEPO-MEDROL) injection 80 mg  80 mg Other Once Tyrell Antonio, MD        Allergies:   Celexa [citalopram hydrobromide] and Gabapentin    Social History:  The patient  reports that she quit smoking about 2 years ago. Her smoking use included cigarettes. She has a 53.75 pack-year smoking history. She has never been exposed to tobacco smoke. She has never used smokeless tobacco. She reports current alcohol use. She reports that she does not use drugs.   Family History:  The patient's family history includes Asthma in her daughter; Bipolar  disorder in her son; CAD (age of onset: 24) in her mother; COPD in her mother; Cancer in her maternal aunt; Colon polyps in her sister; Depression in her mother; Early death in her maternal grandfather; Hearing loss in her paternal grandfather; Heart attack in her father and maternal grandmother.    ROS:  Please see the history of present illness.   Otherwise, review of systems are positive for DOE.   All other systems are reviewed and negative.    PHYSICAL EXAM: VS:  BP (!) 140/78   Pulse 73   Ht 5\' 10"  (1.778 m)   Wt 230 lb (104.3 kg)   SpO2 96%   BMI 33.00 kg/m  , BMI Body mass index is 33 kg/m. GEN: Well nourished, well developed, in no acute distress HEENT: normal Neck: no JVD, carotid bruits, or masses Cardiac: RRR; no murmurs, rubs, or gallops,no edema  Respiratory:  clear to auscultation bilaterally, normal work of breathing GI: soft, nontender, nondistended, + BS MS: no deformity or atrophy Skin: warm and dry, no rash Neuro:  Strength and sensation are intact Psych: euthymic mood, full affect   EKG:   The ekg ordered today demonstrates NSR, poor R wave progression, no ST changes   Recent Labs: 01/08/2023: ALT 49; BUN 18; Creat 1.27; Hemoglobin 12.1; Platelets 229; Potassium 4.6; Sodium 137   Lipid Panel    Component Value Date/Time   CHOL 245 (H) 06/27/2022 1408   TRIG 239.0 (H) 06/27/2022 1408   HDL 44.20 06/27/2022 1408   CHOLHDL 6 06/27/2022 1408   VLDL 47.8 (H) 06/27/2022 1408   LDLCALC 108 (H) 10/30/2020 1227   LDLCALC 244 (H) 07/21/2020 1206   LDLDIRECT 170.0 06/27/2022 1408     Other studies Reviewed: Additional studies/ records that were reviewed today with results demonstrating: Prior CT reviewed..   ASSESSMENT AND PLAN:  DOE: Need echo and CXR.  Has a diagnosis of chronic bronchitis and COPD.  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  Need to rule out ischemia.  Plan for [facility].  Has had PFTs in the past.  Is  followed by pulmonary.  There may be a component of deconditioning given her weight gain.  Need to rule out significant obstructive CAD. Family h/o early CAD: father with MI at 60.  Obesity: Gained weight since stopping smoking, 60-70 lbs.  Hyperlipidemia: Continue atorvastatin.  LDL was over 200 in the past.  Improved but would likely try to get her LDL down to 70 given coronary atherosclerosis.  Await stress testing results to see if there is severe atherosclerosis.   Current medicines are reviewed at length with the patient today.  The patient  concerns regarding her medicines were addressed.  The following changes have been made:  No change  Labs/ tests ordered today include:  No orders of the defined types were placed in this encounter.   Recommend 150 minutes/week of aerobic exercise Low fat, low carb, high fiber diet recommended  Disposition:   FU in based on test results   Signed, Lance Muss, MD  02/03/2023 4:24 PM    [facility] [address] [place], [place], [place]  [address] Phone: [phone]; Fax: [phone]

## 2023-02-03 NOTE — Patient Instructions (Signed)
Medication Instructions:  Your physician recommends that you continue on your current medications as directed. Please refer to the Current Medication list given to you today.  *If you need a refill on your cardiac medications before your next appointment, please call your pharmacy*   Lab Work: none If you have labs (blood work) drawn today and your tests are completely normal, you will receive your results only by: MyChart Message (if you have MyChart) OR A paper copy in the mail If you have any lab test that is abnormal or we need to change your treatment, we will call you to review the results.   Testing/Procedures: Your physician has requested that you have an echocardiogram. Echocardiography is a painless test that uses sound waves to create images of your heart. It provides your doctor with information about the size and shape of your heart and how well your heart's chambers and valves are working. This procedure takes approximately one hour. There are no restrictions for this procedure. Please do NOT wear cologne, perfume, aftershave, or lotions (deodorant is allowed). Please arrive 15 minutes prior to your appointment time.  Your physician has requested that you have a lexiscan myoview. For further information please visit [email or web address] Please follow instruction sheet, as given.    Follow-Up: At [facility], you and your health needs are our priority.  As part of our continuing mission to provide you with exceptional heart care, we have created designated Provider Care Teams.  These Care Teams include your primary Cardiologist (physician) and Advanced Practice Providers (APPs -  Physician Assistants and Nurse Practitioners) who all work together to provide you with the care you need, when you need it.  We recommend signing up for the patient portal called "MyChart".  Sign up information is provided on this After Visit Summary.  MyChart is used to connect with  patients for Virtual Visits (Telemedicine).  Patients are able to view lab/test results, encounter notes, upcoming appointments, etc.  Non-urgent messages can be sent to your provider as well.   To learn more about what you can do with MyChart, go to [email or web address].    Your next appointment:   Based on results  Provider:   Lance Muss, MD     Other Instructions

## 2023-02-04 DIAGNOSIS — J441 Chronic obstructive pulmonary disease with (acute) exacerbation: Secondary | ICD-10-CM

## 2023-02-04 NOTE — Progress Notes (Addendum)
Judy [name] - 72 y.o. female MRN [id number]  Date of birth: 07-Apr-1951  Office Visit Note: Visit Date: 02/03/2023 PCP: Karie Georges, MD Referred by: Karie Georges, MD  Subjective: Chief Complaint  Patient presents with   Lower Back - Pain   HPI:  Judy [name] is a 72 y.o. female who comes in today for planned repeat Bilateral L3-4 Lumbar facet/medial branch block with fluoroscopic guidance.  The patient has failed conservative care including home exercise, medications, time and activity modification.  This injection will be diagnostic and hopefully therapeutic.  Please see requesting physician notes for further details and justification.  Exam shows concordant low back pain with facet joint loading and extension. Patient received more than 80% pain relief from prior injection. This would be the second block in a diagnostic double block paradigm.     Referring:Megan Mayford Knife, FNP   ROS Otherwise per HPI.  Assessment & Plan: Visit Diagnoses:    ICD-10-CM   1. Spondylosis without myelopathy or radiculopathy, lumbar region  M47.816 XR C-ARM NO REPORT    Facet Injection    methylPREDNISolone acetate (DEPO-MEDROL) injection 80 mg      Plan: No additional findings.   Meds & Orders:  Meds ordered this encounter  Medications   methylPREDNISolone acetate (DEPO-MEDROL) injection 80 mg    Orders Placed This Encounter  Procedures   Facet Injection   XR C-ARM NO REPORT    Follow-up: Return for visit to requesting provider as needed.   Procedures: No procedures performed  Lumbar Diagnostic Facet Joint Nerve Block with Fluoroscopic Guidance   [name]      Date of Birth: 07-Oct-1951 MRN: [id number] PCP: Karie Georges, MD      Visit Date: 02/03/2023   Universal Protocol:    Date/Time: 04/30/245:22 AM  Consent Given By: the patient  Position: PRONE  Additional Comments: Vital signs were monitored before and after the  procedure. Patient was prepped and draped in the usual sterile fashion. The correct patient, procedure, and site was verified.   Injection Procedure Details:   Procedure diagnoses:  1. Spondylosis without myelopathy or radiculopathy, lumbar region      Meds Administered:  Meds ordered this encounter  Medications   methylPREDNISolone acetate (DEPO-MEDROL) injection 80 mg     Laterality: Bilateral  Location/Site: L3-L4, L2 and L3 medial branches  Needle: 5.0 in., 25 ga.  Short bevel or Quincke spinal needle  Needle Placement: Oblique pedical  Findings:   -Comments: There was excellent flow of contrast along the articular pillars without intravascular flow.  Procedure Details: The fluoroscope beam is vertically oriented in AP and then obliqued 15 to 20 degrees to the ipsilateral side of the desired nerve to achieve the "Scotty dog" appearance.  The skin over the target area of the junction of the superior articulating process and the transverse process (sacral ala if blocking the L5 dorsal rami) was locally anesthetized with a 1 ml volume of 1% Lidocaine without Epinephrine.  The spinal needle was inserted and advanced in a trajectory view down to the target.   After contact with periosteum and negative aspirate for blood and CSF, correct placement without intravascular or epidural spread was confirmed by injecting 0.5 ml. of Isovue-250.  A spot radiograph was obtained of this image.    Next, a 0.5 ml. volume of the injectate described above was injected. The needle was then redirected to the other facet joint nerves mentioned above if needed.  Prior to the procedure, the patient was given a Pain Diary which was completed for baseline measurements.  After the procedure, the patient rated their pain every 30 minutes and will continue rating at this frequency for a total of 5 hours.  The patient has been asked to complete the Diary and return to [place] by mail, fax or hand delivered as  soon as possible.   Additional Comments:  No complications occurred Dressing: 2 x 2 sterile gauze and Band-Aid    Post-procedure details: Patient was observed during the procedure. Post-procedure instructions were reviewed.  Patient left the clinic in stable condition.   Clinical History: MRI LUMBAR SPINE WITHOUT CONTRAST   TECHNIQUE: Multiplanar, multisequence MR imaging of the lumbar spine was performed. No intravenous contrast was administered.   COMPARISON:  01/06/2020   FINDINGS: Segmentation:  5 lumbar type vertebral bodies.   Alignment:  Mild dextrocurvature.  No significant listhesis.   Vertebrae: No acute fracture or suspicious osseous lesion. Redemonstrated compression deformity of L1, which appears unchanged. Increased T2 signal in the T12-L1 disc, with some loss of cortical signal in the T12-L1 endplates (series 5, image 8-9).   Conus medullaris and cauda equina: Conus extends to the L2 level. Conus and cauda equina appear normal.   Paraspinal and other soft tissues: Atrophy of the right psoas muscle, which is new from the prior exam.   Disc levels:   T12-L1: Minimal disc bulge. Mild facet arthropathy. No spinal canal stenosis or neural foraminal narrowing.   L1-L2: Minimal disc bulge. No spinal canal stenosis or neural foraminal narrowing.   L2-L3: Minimal disc bulge. No spinal canal stenosis or neural foraminal narrowing.   L3-L4: Mild disc bulge, which has progressed from prior exam, with superimposed left paracentral disc extrusion with 12 mm of caudal migration, which is new from the prior exam. Severe facet arthropathy with suspected medially oriented facet synovial cyst, which measures 2 x 2 x 4 mm (series 13, image 26). Mild-to-moderate spinal canal stenosis. Narrowing of the lateral recesses. Mild left neural foraminal narrowing, which is new from the prior exam.   L4-L5: Disc height loss with small left paracentral disc  protrusion, which narrows the left lateral recess. No spinal canal stenosis or neural foraminal narrowing.   L5-S1: Large left lateral osteophyte, which may abut the exiting left L5 nerve. No significant disc bulge. Moderate facet arthropathy. No spinal canal stenosis or neural foraminal narrowing.   IMPRESSION: 1. Increased T2 signal in the T12-L1 disc, with some loss of cortical signal in the T12-L1 endplates. While this could be degenerative in nature, early discitis/osteomyelitis could have a similar appearance. Recommend correlation with inflammatory markers. 2. L3-L4 mild-to-moderate spinal canal stenosis and mild left neural foraminal narrowing, which is new from the prior exam. Narrowing of the lateral recesses at this level could affect the descending L4 nerve roots. 3. L4-L5 narrowing of the left lateral recess, which could affect the descending left L5 nerve. 4. L5-S1 large left lateral osteophyte, which may abut the exiting left L5 nerve. 5. Atrophy of the right psoas muscle, which is new from the prior exam.     Electronically Signed   By: Wiliam Ke M.D.   On: 10/29/2022 12:33     Objective:  VS:  HT:    WT:   BMI:     BP:(!) 142/72  HR:75bpm  TEMP: ( )  RESP:  Physical Exam Vitals and nursing note reviewed.  Constitutional:      General: She is  not in acute distress.    Appearance: Normal appearance. She is not ill-appearing.  HENT:     Head: Normocephalic and atraumatic.     Right Ear: External ear normal.     Left Ear: External ear normal.  Eyes:     Extraocular Movements: Extraocular movements intact.  Cardiovascular:     Rate and Rhythm: Normal rate.     Pulses: Normal pulses.  Pulmonary:     Effort: Pulmonary effort is normal. No respiratory distress.  Abdominal:     General: There is no distension.     Palpations: Abdomen is soft.  Musculoskeletal:        General: Tenderness present.     Cervical back: Neck supple.     Right lower  leg: No edema.     Left lower leg: No edema.     Comments: Patient has good distal strength with no pain over the greater trochanters.  No clonus or focal weakness.  Skin:    Findings: No erythema, lesion or rash.  Neurological:     General: No focal deficit present.     Mental Status: She is alert and oriented to person, place, and time.     Sensory: No sensory deficit.     Motor: No weakness or abnormal muscle tone.     Coordination: Coordination normal.  Psychiatric:        Mood and Affect: Mood normal.        Behavior: Behavior normal.      Imaging: XR C-ARM NO REPORT  Result Date: 02/03/2023 Please see Notes tab for imaging impression.

## 2023-02-04 NOTE — Procedures (Signed)
Lumbar Diagnostic Facet Joint Nerve Block with Fluoroscopic Guidance   [name]      Date of Birth: [birth year] MRN: [id number] PCP: Karie Georges, MD      Visit Date: 02/03/2023   Universal Protocol:    Date/Time: 04/30/245:22 AM  Consent Given By: the patient  Position: PRONE  Additional Comments: Vital signs were monitored before and after the procedure. Patient was prepped and draped in the usual sterile fashion. The correct patient, procedure, and site was verified.   Injection Procedure Details:   Procedure diagnoses:  1. Spondylosis without myelopathy or radiculopathy, lumbar region      Meds Administered:  Meds ordered this encounter  Medications   methylPREDNISolone acetate (DEPO-MEDROL) injection 80 mg     Laterality: Bilateral  Location/Site: L3-L4, L2 and L3 medial branches  Needle: 5.0 in., 25 ga.  Short bevel or Quincke spinal needle  Needle Placement: Oblique pedical  Findings:   -Comments: There was excellent flow of contrast along the articular pillars without intravascular flow.  Procedure Details: The fluoroscope beam is vertically oriented in AP and then obliqued 15 to 20 degrees to the ipsilateral side of the desired nerve to achieve the "Scotty dog" appearance.  The skin over the target area of the junction of the superior articulating process and the transverse process (sacral ala if blocking the L5 dorsal rami) was locally anesthetized with a 1 ml volume of 1% Lidocaine without Epinephrine.  The spinal needle was inserted and advanced in a trajectory view down to the target.   After contact with periosteum and negative aspirate for blood and CSF, correct placement without intravascular or epidural spread was confirmed by injecting 0.5 ml. of Isovue-250.  A spot radiograph was obtained of this image.    Next, a 0.5 ml. volume of the injectate described above was injected. The needle was then redirected to the other facet  joint nerves mentioned above if needed.  Prior to the procedure, the patient was given a Pain Diary which was completed for baseline measurements.  After the procedure, the patient rated their pain every 30 minutes and will continue rating at this frequency for a total of 5 hours.  The patient has been asked to complete the Diary and return to [place] by mail, fax or hand delivered as soon as possible.   Additional Comments:  No complications occurred Dressing: 2 x 2 sterile gauze and Band-Aid    Post-procedure details: Patient was observed during the procedure. Post-procedure instructions were reviewed.  Patient left the clinic in stable condition.

## 2023-02-05 ENCOUNTER — Ambulatory Visit: Payer: Medicare Other

## 2023-02-13 ENCOUNTER — Ambulatory Visit: Payer: Medicare Other | Attending: Rheumatology | Admitting: *Deleted

## 2023-02-13 VITALS — BP 133/80 | HR 71

## 2023-02-13 DIAGNOSIS — M45 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: Secondary | ICD-10-CM | POA: Diagnosis present

## 2023-02-13 MED ORDER — CERTOLIZUMAB PEGOL 2 X 200 MG ~~LOC~~ KIT
400.0000 mg | PACK | Freq: Once | SUBCUTANEOUS | Status: AC
Start: 2023-02-13 — End: 2023-02-13
  Administered 2023-02-13: 400 mg via SUBCUTANEOUS

## 2023-02-13 NOTE — Progress Notes (Signed)
Subjective:   Patient presents to clinic today to receive monthly dose of Cimzia.  Patient running a fever or have signs/symptoms of infection? No  Patient currently on antibiotics for the treatment of infection? No  Patient have any upcoming invasive procedures/surgeries? No  Objective: CMP     Component Value Date/Time   NA 137 01/08/2023 1541   K 4.6 01/08/2023 1541   CL 105 01/08/2023 1541   CO2 22 01/08/2023 1541   GLUCOSE 99 01/08/2023 1541   BUN 18 01/08/2023 1541   CREATININE 1.27 (H) 01/08/2023 1541   CALCIUM 9.7 01/08/2023 1541   PROT 6.9 01/08/2023 1541   ALBUMIN 4.2 10/30/2020 1227   AST 36 (H) 01/08/2023 1541   ALT 49 (H) 01/08/2023 1541   ALKPHOS 144 (H) 10/30/2020 1227   BILITOT 0.4 01/08/2023 1541   GFRNONAA >60 12/29/2021 0320   GFRNONAA 47 (L) 04/11/2021 1419   GFRAA 54 (L) 04/11/2021 1419    CBC    Component Value Date/Time   WBC 5.1 01/08/2023 1541   RBC 4.29 01/08/2023 1541   HGB 12.1 01/08/2023 1541   HCT 36.6 01/08/2023 1541   PLT 229 01/08/2023 1541   MCV 85.3 01/08/2023 1541   MCH 28.2 01/08/2023 1541   MCHC 33.1 01/08/2023 1541   RDW 14.1 01/08/2023 1541   LYMPHSABS 902.7 01/08/2023 1541   MONOABS 0.4 10/30/2020 1227   EOSABS 10 (L) 01/08/2023 1541   BASOSABS 10 01/08/2023 1541    Baseline Immunosuppressant Therapy Labs TB GOLD    Latest Ref Rng & Units 04/18/2022    3:19 PM  Quantiferon TB Gold  Quantiferon TB Gold Plus NEGATIVE NEGATIVE    Hepatitis Panel    Latest Ref Rng & Units 04/26/2020   11:26 AM  Hepatitis  Hep B Surface Ag NON-REACTI NON-REACTIVE   Hep B IgM NON-REACTI NON-REACTIVE    HIV Lab Results  Component Value Date   HIV NON-REACTIVE 04/26/2020   HIV Non Reactive 08/23/2017   HIV Non Reactive 04/14/2017   Immunoglobulins    Latest Ref Rng & Units 04/26/2020   11:26 AM  Immunoglobulin Electrophoresis  IgA  70 - 320 mg/dL 098   IgG 119 - 1,478 mg/dL 295   IgM 50 - 621 mg/dL 47    SPEP    Latest  Ref Rng & Units 01/08/2023    3:41 PM  Serum Protein Electrophoresis  Total Protein 6.1 - 8.1 g/dL 6.9    H0QM No results found for: "G6PDH" TPMT No results found for: "TPMT"   Chest x-ray: 02/01/2022 Chronic appearing increased interstitial lung markings without evidence of acute or active cardiopulmonary disease   Assessment/Plan:   Administrations This Visit     certolizumab pegol (CIMZIA) kit 400 mg     Admin Date 02/13/2023 Action Given Dose 400 mg Route Subcutaneous Administered By Henriette Combs, LPN             Patient tolerated injection well.   Appointment for next injection scheduled for 03/13/2023.  Patient due for labs in July 2024.  Patient is to call and reschedule appointment if running a fever with signs/symptoms of infection, on antibiotics for active infection or has an upcoming invasive procedure.  All questions encouraged and answered.  Instructed patient to call with any further questions or concerns.

## 2023-02-14 ENCOUNTER — Telehealth: Payer: Self-pay | Admitting: Physical Medicine and Rehabilitation

## 2023-02-14 NOTE — Telephone Encounter (Signed)
Patient called. She says the shot didn't help.

## 2023-02-17 ENCOUNTER — Telehealth: Payer: Medicare Other

## 2023-02-18 NOTE — Telephone Encounter (Signed)
Spoke with patient and she stated the injection only worked a few days. She states the pain is the same. She wants to know what can be done. Please advise

## 2023-02-20 ENCOUNTER — Other Ambulatory Visit: Payer: Self-pay | Admitting: Physical Medicine and Rehabilitation

## 2023-02-20 DIAGNOSIS — G8929 Other chronic pain: Secondary | ICD-10-CM

## 2023-02-20 DIAGNOSIS — M47816 Spondylosis without myelopathy or radiculopathy, lumbar region: Secondary | ICD-10-CM

## 2023-02-20 NOTE — Progress Notes (Signed)
Patient reports greater than 80% relief pain with second set of diagnostic facet blocks bilaterally at L3-L4. We will proceed with radiofrequency ablation.

## 2023-02-21 ENCOUNTER — Telehealth (HOSPITAL_COMMUNITY): Payer: Self-pay | Admitting: *Deleted

## 2023-02-21 NOTE — Telephone Encounter (Signed)
Left detailed message with instructions for MPI on 02/25/23.

## 2023-02-25 ENCOUNTER — Telehealth: Payer: Self-pay | Admitting: *Deleted

## 2023-02-25 ENCOUNTER — Ambulatory Visit (HOSPITAL_COMMUNITY): Payer: Medicare Other | Attending: Cardiovascular Disease

## 2023-02-25 ENCOUNTER — Ambulatory Visit (HOSPITAL_BASED_OUTPATIENT_CLINIC_OR_DEPARTMENT_OTHER): Payer: Medicare Other

## 2023-02-25 DIAGNOSIS — R0609 Other forms of dyspnea: Secondary | ICD-10-CM

## 2023-02-25 DIAGNOSIS — R0600 Dyspnea, unspecified: Secondary | ICD-10-CM

## 2023-02-25 LAB — MYOCARDIAL PERFUSION IMAGING
LV dias vol: 60 mL (ref 46–106)
LV sys vol: 20 mL
Nuc Stress EF: 67 %
Peak HR: 73 {beats}/min
Rest HR: 69 {beats}/min
Rest Nuclear Isotope Dose: 10.6 mCi
SDS: 1
SRS: 0
SSS: 1
ST Depression (mm): 0 mm
Stress Nuclear Isotope Dose: 32.5 mCi
TID: 1.02

## 2023-02-25 LAB — ECHOCARDIOGRAM COMPLETE
Area-P 1/2: 3.89 cm2
S' Lateral: 2.8 cm

## 2023-02-25 MED ORDER — TECHNETIUM TC 99M TETROFOSMIN IV KIT
32.5000 | PACK | Freq: Once | INTRAVENOUS | Status: AC | PRN
  Administered 2023-02-25: 32.5 via INTRAVENOUS

## 2023-02-25 MED ORDER — PERFLUTREN LIPID MICROSPHERE
3.0000 mL | INTRAVENOUS | Status: AC | PRN
Start: 2023-02-25 — End: 2023-02-25
  Administered 2023-02-25: 3 mL via INTRAVENOUS

## 2023-02-25 MED ORDER — REGADENOSON 0.4 MG/5ML IV SOLN
0.4000 mg | Freq: Once | INTRAVENOUS | Status: AC
Start: 2023-02-25 — End: 2023-02-25
  Administered 2023-02-25: 0.4 mg via INTRAVENOUS

## 2023-02-25 MED ORDER — TECHNETIUM TC 99M TETROFOSMIN IV KIT
10.6000 | PACK | Freq: Once | INTRAVENOUS | Status: AC | PRN
  Administered 2023-02-25: 10.6 via INTRAVENOUS

## 2023-02-25 NOTE — Telephone Encounter (Signed)
-----   Message from Corky Crafts, MD sent at 02/25/2023  3:00 PM EDT ----- LV perfusion is normal. There is no evidence of ischemia. There is no evidence of infarction.  Left ventricular function is normal. Nuclear stress EF: 67 %.  WOuld still try to get LDL closer to 70.  Can f/u with [place] in a year.  Increase atorvastatin to 80 mg and check lipids and liver tests in 3 months

## 2023-02-25 NOTE — Telephone Encounter (Signed)
Left message to call office

## 2023-02-25 NOTE — Telephone Encounter (Signed)
-----   Message from Corky Crafts, MD sent at 02/25/2023  3:00 PM EDT ----- Normal LV/RV/valvular function.

## 2023-03-05 ENCOUNTER — Telehealth: Payer: Self-pay

## 2023-03-05 ENCOUNTER — Ambulatory Visit (INDEPENDENT_AMBULATORY_CARE_PROVIDER_SITE_OTHER): Payer: Medicare Other | Admitting: Physical Medicine and Rehabilitation

## 2023-03-05 ENCOUNTER — Encounter: Payer: Self-pay | Admitting: Physical Medicine and Rehabilitation

## 2023-03-05 DIAGNOSIS — G894 Chronic pain syndrome: Secondary | ICD-10-CM

## 2023-03-05 DIAGNOSIS — M545 Low back pain, unspecified: Secondary | ICD-10-CM | POA: Diagnosis not present

## 2023-03-05 DIAGNOSIS — G8929 Other chronic pain: Secondary | ICD-10-CM

## 2023-03-05 DIAGNOSIS — M47816 Spondylosis without myelopathy or radiculopathy, lumbar region: Secondary | ICD-10-CM | POA: Diagnosis not present

## 2023-03-05 MED ORDER — TRAMADOL HCL 50 MG PO TABS: 50.0000 mg | ORAL_TABLET | Freq: Three times a day (TID) | ORAL | 0 refills | Status: DC | PRN

## 2023-03-05 NOTE — Progress Notes (Signed)
Functional Pain Scale - descriptive words and definitions  Unmanageable (7)  Pain interferes with normal ADL's/nothing seems to help/sleep is very difficult/active distractions are very difficult to concentrate on. Severe range order  Average Pain 9  Lower back pain that goes all the way across with no radiation

## 2023-03-05 NOTE — Telephone Encounter (Signed)
Left message to call office

## 2023-03-05 NOTE — Telephone Encounter (Signed)
Patient called in and stated she is having so much pain that she can barely walk. She is being scheduled for today on Megan's schedule

## 2023-03-05 NOTE — Progress Notes (Signed)
Judy [name] - 72 y.o. female MRN [id number]  Date of birth: 06/30/1951  Office Visit Note: Visit Date: 03/05/2023 PCP: Karie Georges, MD Referred by: Karie Georges, MD  Subjective: Chief Complaint  Patient presents with   Lower Back - Pain   HPI: Judy [name] is a 72 y.o. female who comes in today for evaluation of chronic, worsening and severe bilateral lower back pain. Pain ongoing for several years, worsened on Monday after shopping with grandson. Pain worsens with prolonged sitting, standing and walking. She describes her pain as sore and aching, currently rates as 8 out of 10. No relief of pain with formal physical therapy, chiropractic treatments, home exercise regimen and medications. No relief of pain with recent trial of Celebrex. Recent lumbar MRI imaging exhibits severe facet arthropathy with suspected medially oriented facet synovial cyst at L3-L4. There is also left paracentral disc extrusion with 12 mm of caudal migration and mild to moderate spinal canal stenosis at this level. Radiologist report did note increased T2 signal in the T12-L1 disc that could be related to possible discitis/osteomyelitis. Per Dr. Eliberto Ivory notes her clinical presentation was not consistent with discitis. Patient recently underwent bilateral L3-L4 facet joint injections on 2/26 and 4/29 with greater than 80% relief of pain with this procedure. She is scheduled to come in for bilateral L3-L4 radiofrequency ablation on 03/18/2023. States she needed to be seen today due to severe pain. She was recently referred to [facility] by Sherron Ales, PA with [facility]. Patient went to appointment on 5/16, however she was forced to reschedule due to power outage in facility. Patient denies focal weakness, numbness and tingling. No recent trauma or falls.    Review of Systems  Musculoskeletal:  Positive for back pain.  Neurological:  Negative for tingling, sensory change,  focal weakness and weakness.  All other systems reviewed and are negative.  Otherwise per HPI.  Assessment & Plan: Visit Diagnoses:    ICD-10-CM   1. Chronic bilateral low back pain without sciatica  M54.50    G89.29     2. Spondylosis without myelopathy or radiculopathy, lumbar region  M47.816     3. Facet hypertrophy of lumbar region  M47.816     4. Chronic pain syndrome  G89.4        Plan: Findings:  Chronic, worsening and severe bilateral axial back pain. No radicular symptoms.  Patient continues to have severe pain despite good conservative therapies such as formal physical therapy, chiropractic treatments, home exercise regimen and use of medications.  Patient's clinical presentation and exam are consistent with facet mediated pain. Significant relief of pain with 2 sets of facet joint injections. We will see her back for bilateral L3-L4 radiofrequency ablation in the coming weeks. I discussed medication management with her today, prescribed short course of Tramadol until we are able to get her in for ablation. I do feel she would benefit from a more comprehensive pain management approach, I encouraged her to call [facility] to reschedule visit. Patient has no questions at this time. No red flag symptoms noted upon exam today.     Meds & Orders:  Meds ordered this encounter  Medications   traMADol (ULTRAM) 50 MG tablet    Sig: Take 1 tablet (50 mg total) by mouth every 8 (eight) hours as needed.    Dispense:  20 tablet    Refill:  0   No orders of the defined types  were placed in this encounter.   Follow-up: Return for follow up for bilateral L3-L4 radiofrequency ablation.   Procedures: No procedures performed      Clinical History: MRI LUMBAR SPINE WITHOUT CONTRAST   TECHNIQUE: Multiplanar, multisequence MR imaging of the lumbar spine was performed. No intravenous contrast was administered.   COMPARISON:  01/06/2020   FINDINGS: Segmentation:  5 lumbar  type vertebral bodies.   Alignment:  Mild dextrocurvature.  No significant listhesis.   Vertebrae: No acute fracture or suspicious osseous lesion. Redemonstrated compression deformity of L1, which appears unchanged. Increased T2 signal in the T12-L1 disc, with some loss of cortical signal in the T12-L1 endplates (series 5, image 8-9).   Conus medullaris and cauda equina: Conus extends to the L2 level. Conus and cauda equina appear normal.   Paraspinal and other soft tissues: Atrophy of the right psoas muscle, which is new from the prior exam.   Disc levels:   T12-L1: Minimal disc bulge. Mild facet arthropathy. No spinal canal stenosis or neural foraminal narrowing.   L1-L2: Minimal disc bulge. No spinal canal stenosis or neural foraminal narrowing.   L2-L3: Minimal disc bulge. No spinal canal stenosis or neural foraminal narrowing.   L3-L4: Mild disc bulge, which has progressed from prior exam, with superimposed left paracentral disc extrusion with 12 mm of caudal migration, which is new from the prior exam. Severe facet arthropathy with suspected medially oriented facet synovial cyst, which measures 2 x 2 x 4 mm (series 13, image 26). Mild-to-moderate spinal canal stenosis. Narrowing of the lateral recesses. Mild left neural foraminal narrowing, which is new from the prior exam.   L4-L5: Disc height loss with small left paracentral disc protrusion, which narrows the left lateral recess. No spinal canal stenosis or neural foraminal narrowing.   L5-S1: Large left lateral osteophyte, which may abut the exiting left L5 nerve. No significant disc bulge. Moderate facet arthropathy. No spinal canal stenosis or neural foraminal narrowing.   IMPRESSION: 1. Increased T2 signal in the T12-L1 disc, with some loss of cortical signal in the T12-L1 endplates. While this could be degenerative in nature, early discitis/osteomyelitis could have a similar appearance. Recommend correlation  with inflammatory markers. 2. L3-L4 mild-to-moderate spinal canal stenosis and mild left neural foraminal narrowing, which is new from the prior exam. Narrowing of the lateral recesses at this level could affect the descending L4 nerve roots. 3. L4-L5 narrowing of the left lateral recess, which could affect the descending left L5 nerve. 4. L5-S1 large left lateral osteophyte, which may abut the exiting left L5 nerve. 5. Atrophy of the right psoas muscle, which is new from the prior exam.     Electronically Signed   By: Wiliam Ke M.D.   On: 10/29/2022 12:33   She reports that she quit smoking about 2 years ago. Her smoking use included cigarettes. She has a 53.75 pack-year smoking history. She has never been exposed to tobacco smoke. She has never used smokeless tobacco.  Recent Labs    06/27/22 1723 12/23/22 1317  HGBA1C 6.4 6.2*    Objective:  VS:  HT:    WT:   BMI:     BP:   HR: bpm  TEMP: ( )  RESP:  Physical Exam Vitals and nursing note reviewed.  HENT:     Head: Normocephalic and atraumatic.     Right Ear: External ear normal.     Left Ear: External ear normal.     Nose: Nose normal.  Mouth/Throat:     Mouth: Mucous membranes are moist.  Eyes:     Extraocular Movements: Extraocular movements intact.  Cardiovascular:     Rate and Rhythm: Normal rate.     Pulses: Normal pulses.  Pulmonary:     Effort: Pulmonary effort is normal.  Abdominal:     General: Abdomen is flat. There is no distension.  Musculoskeletal:        General: Tenderness present.     Cervical back: Normal range of motion.     Comments: Patient rises from seated position to standing without difficulty. Concordant low back pain with facet loading, lumbar spine extension and rotation. 5/5 strength noted with bilateral hip flexion, knee flexion/extension, ankle dorsiflexion/plantarflexion and EHL. No clonus noted bilaterally. No pain upon palpation of greater trochanters. No pain with  internal/external rotation of bilateral hips. Sensation intact bilaterally. Negative slump test bilaterally. Ambulates without aid, gait steady.    Skin:    General: Skin is warm and dry.     Capillary Refill: Capillary refill takes less than 2 seconds.  Neurological:     General: No focal deficit present.     Mental Status: She is alert and oriented to person, place, and time.  Psychiatric:        Mood and Affect: Mood normal.        Behavior: Behavior normal.     Ortho Exam  Imaging: No results found.  Past Medical/Family/Surgical/Social History: Medications & Allergies reviewed per EMR, new medications updated. 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   Status post total replacement of left hip 03/19/2019   PTSD (post-traumatic stress disorder)    COPD (chronic obstructive pulmonary disease) (HCC)    Bipolar 1 disorder (HCC)    Anxiety    B12 deficiency 12/09/2018   Hyperglycemia 04/14/2017   Past Medical History:  Diagnosis Date   Anxiety    Arthritis    Avascular necrosis (HCC)    Left hip   Avascular necrosis of bone of hip, 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   Bipolar 1 disorder (HCC)    COPD (chronic obstructive pulmonary disease) (HCC)    Depression    Dyspnea    Pneumonia    PTSD (post-traumatic stress disorder)    Family History  Problem Relation Age of Onset   CAD Mother 59   COPD Mother    Depression Mother    Heart attack Father    Colon polyps Sister    Cancer Maternal Aunt        growth on face- underwent chemo   Heart attack Maternal Grandmother    Early death Maternal Grandfather    Hearing loss  Paternal Grandfather    Asthma Daughter    Bipolar disorder Son    Past Surgical History:  Procedure Laterality Date   bone spurs foot Right    CATARACT EXTRACTION Bilateral    COLONOSCOPY     COLONOSCOPY  07/2022   HAND SURGERY Left    JOINT REPLACEMENT     TONSILLECTOMY     TONSILLECTOMY AND ADENOIDECTOMY     TOTAL HIP ARTHROPLASTY Left 03/19/2019   Procedure: LEFT TOTAL HIP  ARTHROPLASTY ANTERIOR APPROACH;  Surgeon: [name], MD;  Location: [facility];  Service: Orthopedics;  Laterality: Left;   TOTAL HIP ARTHROPLASTY Right 06/04/2019   Procedure: RIGHT TOTAL HIP ARTHROPLASTY ANTERIOR APPROACH;  Surgeon: [name], MD;  Location: [facility];  Service: Orthopedics;  Laterality: Right;   TOTAL KNEE ARTHROPLASTY Left 12/28/2021   Procedure: Left TOTAL KNEE ARTHROPLASTY;  Surgeon: [name], MD;  Location: [facility];  Service: Orthopedics;  Laterality: Left;   Social History   Occupational History   Occupation: retired  Tobacco Use   Smoking status: Former    Packs/day: 1.25    Years: 43.00    Additional pack years: 0.00    Total pack years: 53.75    Types: Cigarettes    Quit date: 05/19/2020    Years since quitting: 2.7    Passive exposure: Never   Smokeless tobacco: Never  Vaping Use   Vaping Use: Never used  Substance and Sexual Activity   Alcohol use: Yes    Comment: occ   Drug use: No   Sexual activity: Not on file

## 2023-03-10 MED ORDER — ATORVASTATIN CALCIUM 80 MG PO TABS: 80.0000 mg | ORAL_TABLET | Freq: Every day | ORAL | 1 refills | Status: DC

## 2023-03-10 NOTE — Telephone Encounter (Signed)
Results reviewed with patient.  Prescription sent to [facility] on [address].  Patient thinks she is having lab work done in September at PCP.  If lipids and liver are not being checked at September appointment she will call our office so lab work can be scheduled here.

## 2023-03-13 ENCOUNTER — Ambulatory Visit: Payer: Medicare Other | Attending: Rheumatology | Admitting: *Deleted

## 2023-03-13 VITALS — BP 151/72 | HR 83

## 2023-03-13 DIAGNOSIS — M45 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: Secondary | ICD-10-CM | POA: Insufficient documentation

## 2023-03-13 MED ORDER — CERTOLIZUMAB PEGOL 2 X 200 MG ~~LOC~~ KIT
400.0000 mg | PACK | Freq: Once | SUBCUTANEOUS | Status: AC
Start: 2023-03-13 — End: 2023-03-13
  Administered 2023-03-13: 400 mg via SUBCUTANEOUS

## 2023-03-13 NOTE — Progress Notes (Signed)
Subjective:   Patient presents to clinic today to receive monthly dose of Cimzia.  Patient running a fever or have signs/symptoms of infection? No  Patient currently on antibiotics for the treatment of infection? No  Patient have any upcoming invasive procedures/surgeries? No  Objective: CMP     Component Value Date/Time   NA 137 01/08/2023 1541   K 4.6 01/08/2023 1541   CL 105 01/08/2023 1541   CO2 22 01/08/2023 1541   GLUCOSE 99 01/08/2023 1541   BUN 18 01/08/2023 1541   CREATININE 1.27 (H) 01/08/2023 1541   CALCIUM 9.7 01/08/2023 1541   PROT 6.9 01/08/2023 1541   ALBUMIN 4.2 10/30/2020 1227   AST 36 (H) 01/08/2023 1541   ALT 49 (H) 01/08/2023 1541   ALKPHOS 144 (H) 10/30/2020 1227   BILITOT 0.4 01/08/2023 1541   GFRNONAA >60 12/29/2021 0320   GFRNONAA 47 (L) 04/11/2021 1419   GFRAA 54 (L) 04/11/2021 1419    CBC    Component Value Date/Time   WBC 5.1 01/08/2023 1541   RBC 4.29 01/08/2023 1541   HGB 12.1 01/08/2023 1541   HCT 36.6 01/08/2023 1541   PLT 229 01/08/2023 1541   MCV 85.3 01/08/2023 1541   MCH 28.2 01/08/2023 1541   MCHC 33.1 01/08/2023 1541   RDW 14.1 01/08/2023 1541   LYMPHSABS 902.7 01/08/2023 1541   MONOABS 0.4 10/30/2020 1227   EOSABS 10 (L) 01/08/2023 1541   BASOSABS 10 01/08/2023 1541    Baseline Immunosuppressant Therapy Labs TB GOLD    Latest Ref Rng & Units 04/18/2022    3:19 PM  Quantiferon TB Gold  Quantiferon TB Gold Plus NEGATIVE NEGATIVE    Hepatitis Panel    Latest Ref Rng & Units 04/26/2020   11:26 AM  Hepatitis  Hep B Surface Ag NON-REACTI NON-REACTIVE   Hep B IgM NON-REACTI NON-REACTIVE    HIV Lab Results  Component Value Date   HIV NON-REACTIVE 04/26/2020   HIV Non Reactive 08/23/2017   HIV Non Reactive 04/14/2017   Immunoglobulins    Latest Ref Rng & Units 04/26/2020   11:26 AM  Immunoglobulin Electrophoresis  IgA  70 - 320 mg/dL 469   IgG 629 - 5,284 mg/dL 132   IgM 50 - 440 mg/dL 47    SPEP    Latest  Ref Rng & Units 01/08/2023    3:41 PM  Serum Protein Electrophoresis  Total Protein 6.1 - 8.1 g/dL 6.9    N0UV No results found for: "G6PDH" TPMT No results found for: "TPMT"   Chest x-ray: 02/01/2022 Chronic appearing increased interstitial lung markings without evidence of acute or active cardiopulmonary disease   Assessment/Plan:   Administrations This Visit     certolizumab pegol (CIMZIA) kit 400 mg     Admin Date 03/13/2023 Action Given Dose 400 mg Route Subcutaneous Administered By Henriette Combs, LPN             Patient tolerated injection well.   Appointment for next injection scheduled for 04/17/2023.  Patient due for labs in July 2024.  Patient is to call and reschedule appointment if running a fever with signs/symptoms of infection, on antibiotics for active infection or has an upcoming invasive procedure.  All questions encouraged and answered.  Instructed patient to call with any further questions or concerns.

## 2023-03-18 ENCOUNTER — Ambulatory Visit (INDEPENDENT_AMBULATORY_CARE_PROVIDER_SITE_OTHER): Payer: Medicare Other | Admitting: Physical Medicine and Rehabilitation

## 2023-03-18 ENCOUNTER — Other Ambulatory Visit: Payer: Self-pay

## 2023-03-18 VITALS — BP 144/72 | HR 96

## 2023-03-18 DIAGNOSIS — M5416 Radiculopathy, lumbar region: Secondary | ICD-10-CM

## 2023-03-18 MED ORDER — METHYLPREDNISOLONE ACETATE 80 MG/ML IJ SUSP
80.0000 mg | Freq: Once | INTRAMUSCULAR | Status: AC
Start: 2023-03-18 — End: 2023-03-18
  Administered 2023-03-18: 80 mg

## 2023-03-18 NOTE — Patient Instructions (Signed)

## 2023-03-18 NOTE — Progress Notes (Signed)
Functional Pain Scale - descriptive words and definitions  Unmanageable (7)  Pain interferes with normal ADL's/nothing seems to help/sleep is very difficult/active distractions are very difficult to concentrate on. Severe range order  Average Pain 7   +Driver, -BT, -Dye Allergies.  Lower back pain on both sides

## 2023-03-21 ENCOUNTER — Encounter: Payer: Self-pay | Admitting: Family Medicine

## 2023-03-21 ENCOUNTER — Ambulatory Visit (INDEPENDENT_AMBULATORY_CARE_PROVIDER_SITE_OTHER): Payer: Medicare Other | Admitting: Family Medicine

## 2023-03-21 VITALS — BP 138/80 | HR 75 | Temp 98.5°F | Wt 228.2 lb

## 2023-03-21 DIAGNOSIS — J441 Chronic obstructive pulmonary disease with (acute) exacerbation: Secondary | ICD-10-CM | POA: Diagnosis not present

## 2023-03-21 DIAGNOSIS — R052 Subacute cough: Secondary | ICD-10-CM | POA: Diagnosis not present

## 2023-03-21 DIAGNOSIS — J019 Acute sinusitis, unspecified: Secondary | ICD-10-CM

## 2023-03-21 MED ORDER — AMOXICILLIN 875 MG PO TABS
875.0000 mg | ORAL_TABLET | Freq: Two times a day (BID) | ORAL | 0 refills | Status: AC
Start: 2023-03-21 — End: 2023-03-28

## 2023-03-21 NOTE — Progress Notes (Signed)
   Established Patient Office Visit   Subjective  Patient ID: Judy [name], female    DOB: 10-11-1950  Age: 72 y.o. MRN: [id number]  Chief Complaint  Patient presents with   Cough    Went on vacation last month, was around smokers, when got home still had the cough, has gotten worse. Feels overall miserable. Cough became productive a few days ago. Has taken cough med and cold tablets.    Patient is a 72 year old female followed by Dr. Casimiro Needle and seen for acute/ongoing concern.  Patient endorses current medication and may and developing a cough after being around smokers.  Patient states the cough has been dry but became productive in the last 2 weeks.  Patient also endorses chills, rhinorrhea, headache.  States feels clammy.  Tried Tylenol and OTC cough/cold medication for symptoms.  Patient denies shortness of breath, wheezing.  Cough      Review of Systems  Respiratory:  Positive for cough.    Negative unless stated above    Objective:     BP 138/80 (BP Location: Left Arm, Patient Position: Sitting, Cuff Size: Large)   Pulse 75   Temp 98.5 F (36.9 C) (Oral)   Wt 228 lb 3.2 oz (103.5 kg)   SpO2 95%   BMI 32.74 kg/m    Physical Exam Constitutional:      General: She is not in acute distress.    Appearance: Normal appearance.  HENT:     Head: Normocephalic and atraumatic.     Nose: Nose normal.     Mouth/Throat:     Mouth: Mucous membranes are moist.  Cardiovascular:     Rate and Rhythm: Normal rate and regular rhythm.     Heart sounds: Normal heart sounds. No murmur heard.    No gallop.  Pulmonary:     Effort: Pulmonary effort is normal. No respiratory distress.     Breath sounds: Normal breath sounds. No wheezing, rhonchi or rales.  Skin:    General: Skin is warm and dry.  Neurological:     Mental Status: She is alert and oriented to person, place, and time.      No results found for any visits on 03/21/23.    Assessment & Plan:  Subacute  sinusitis, unspecified location -     Amoxicillin; Take 1 tablet (875 mg total) by mouth 2 (two) times daily for 7 days.  Dispense: 14 tablet; Refill: 0  Subacute cough  Chronic obstructive pulmonary disease with acute exacerbation (HCC)  Start ABX for sinusitis and COPD with acute exacerbation given change in cough/increase in sputum production.  Supportive care including Flonase, OTC cough/cold medications, inhalers.  For continued/worsening cough/respiratory symptoms consider prednisone prednisone burst and CXR.  Of note CXR unavailable in clinic today.  Return if symptoms worsen or fail to improve.   Deeann Saint, MD

## 2023-03-28 ENCOUNTER — Telehealth: Payer: Self-pay

## 2023-03-28 ENCOUNTER — Ambulatory Visit (INDEPENDENT_AMBULATORY_CARE_PROVIDER_SITE_OTHER): Payer: Medicare Other

## 2023-03-28 VITALS — Ht 70.0 in | Wt 228.0 lb

## 2023-03-28 DIAGNOSIS — Z0000 Encounter for general adult medical examination without abnormal findings: Secondary | ICD-10-CM | POA: Diagnosis not present

## 2023-03-28 NOTE — Progress Notes (Signed)
Subjective:   Judy [name] is a 72 y.o. female who presents for Medicare Annual (Subsequent) preventive examination.  Visit Complete: Virtual  I connected with  Judy [name] on 03/28/23 by a audio enabled telemedicine application and verified that I am speaking with the correct person using two identifiers.  Patient Location: Home  Provider Location: Home Office  I discussed the limitations of evaluation and management by telemedicine. The patient expressed understanding and agreed to proceed.  Patient Medicare AWV questionnaire was completed by the patient on ; I have confirmed that all information answered by patient is correct and no changes since this date.  Review of Systems     Cardiac Risk Factors include: advanced age (>3men, >65 women);hypertension     Objective:    Today's Vitals   03/28/23 1554 03/28/23 1555  Weight: 228 lb (103.4 kg)   Height: 5\' 10"  (1.778 m)   PainSc:  2    Body mass index is 32.71 kg/m.     03/28/2023    4:07 PM 03/18/2022    1:55 PM 02/05/2022    3:05 PM 12/28/2021    6:00 PM 12/28/2021   11:55 AM 12/19/2021    1:54 PM 07/11/2021    1:20 PM  Advanced Directives  Does Patient Have a Medical Advance Directive? Yes No No No No No No  Type of Estate agent of [place];Living will        Copy of Healthcare Power of Attorney in Chart? No - copy requested        Would patient like information on creating a medical advance directive?  No - Patient declined No - Patient declined No - Patient declined No - Patient declined  No - Patient declined    Current Medications (verified) Outpatient Encounter Medications as of 03/28/2023  Medication Sig   amoxicillin (AMOXIL) 875 MG tablet Take 1 tablet (875 mg total) by mouth 2 (two) times daily for 7 days.   atorvastatin (LIPITOR) 80 MG tablet Take 1 tablet (80 mg total) by mouth daily.   Biotin 57322 MCG TABS Take by mouth.   Calcium-Phosphorus-Vitamin D (CITRACAL +D3  PO) Take 1 tablet by mouth daily. Calcium 650 mg & vitamin D 1000 units   Certolizumab Pegol (CIMZIA [place]) Inject into the skin every 28 (twenty-eight) days.   Cholecalciferol (VITAMIN D3) 50 MCG (2000 UT) TABS Take 1 tablet by mouth in the morning.   Cyanocobalamin (VITAMIN B-12 PO) Take 2,500 mcg by mouth in the morning.   Fluticasone-Umeclidin-Vilant (TRELEGY ELLIPTA) 100-62.5-25 MCG/ACT AEPB Inhale 1 puff into the lungs daily.   levalbuterol (XOPENEX HFA) 45 MCG/ACT inhaler Inhale 1 puff into the lungs every 6 (six) hours as needed for wheezing or shortness of breath.   LORazepam (ATIVAN) 1 MG tablet Take 1 mg by mouth as needed.   Misc Natural Products (NEURIVA) CAPS Take by mouth.   QUEtiapine (SEROQUEL) 400 MG tablet Take 800 mg by mouth at bedtime.   rOPINIRole (REQUIP) 1 MG tablet 1/2 tablet in the morning, and 2 tablets at bedtime.   traMADol (ULTRAM) 50 MG tablet Take 1 tablet (50 mg total) by mouth every 8 (eight) hours as needed.   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   Bipolar 1 disorder (HCC)    COPD (chronic obstructive pulmonary disease) (HCC)    Depression    Dyspnea    Pneumonia    PTSD (post-traumatic stress disorder)    Past Surgical History:  Procedure Laterality Date   bone spurs foot Right    CATARACT EXTRACTION Bilateral    COLONOSCOPY     COLONOSCOPY  07/2022   HAND SURGERY Left    JOINT REPLACEMENT     TONSILLECTOMY     TONSILLECTOMY AND ADENOIDECTOMY     TOTAL HIP ARTHROPLASTY Left 03/19/2019   Procedure: LEFT TOTAL HIP ARTHROPLASTY ANTERIOR APPROACH;   Surgeon: [name], MD;  Location: [facility];  Service: Orthopedics;  Laterality: Left;   TOTAL HIP ARTHROPLASTY Right 06/04/2019   Procedure: RIGHT TOTAL HIP ARTHROPLASTY ANTERIOR APPROACH;  Surgeon: [name], MD;  Location: [facility];  Service: Orthopedics;  Laterality: Right;   TOTAL KNEE ARTHROPLASTY Left 12/28/2021   Procedure: Left TOTAL KNEE ARTHROPLASTY;  Surgeon: [name], MD;  Location: [facility];  Service: Orthopedics;  Laterality: Left;   Family History  Problem Relation Age of Onset   CAD Mother 48   COPD Mother    Depression Mother    Heart attack Father    Colon polyps Sister    Cancer Maternal Aunt        growth on face- underwent chemo   Heart attack Maternal Grandmother    Early death Maternal Grandfather    Hearing loss Paternal Grandfather    Asthma Daughter    Bipolar disorder Son    Social History   Socioeconomic History   Marital status: Divorced    Spouse name: Not on file   Number of children: Not on file   Years of education: Not on file   Highest education level: Not on file  Occupational History   Occupation: retired  Tobacco Use   Smoking status: Former    Packs/day: 1.25    Years: 43.00    Additional pack years: 0.00    Total pack years: 53.75    Types: Cigarettes    Quit date: 05/19/2020    Years since quitting: 2.8    Passive exposure: Never   Smokeless tobacco: Never  Vaping Use   Vaping Use: Never used  Substance and Sexual Activity   Alcohol use: Yes    Comment: occ   Drug use: No   Sexual activity: Not on file  Other Topics Concern   Not on file  Social History Narrative   Not on file   Social Determinants of Health   Financial Resource Strain: Low Risk  (03/28/2023)   Overall Financial Resource Strain (CARDIA)    Difficulty of Paying Living Expenses: Not hard at all  Food Insecurity: No Food Insecurity (03/28/2023)   Hunger Vital Sign    Worried About Running Out of Food in the Last Year:  Never true    Ran Out of Food in the Last Year: Never true  Transportation Needs: No Transportation Needs (03/28/2023)   PRAPARE - Administrator, Civil Service (Medical): No    Lack of Transportation (Non-Medical): No  Physical Activity: Inactive (03/28/2023)   Exercise Vital Sign    Days of Exercise per Week: 0 days    Minutes of Exercise per Session: 0 min  Stress: Stress Concern  Present (03/28/2023)   [employer] of Occupational Health - Occupational Stress Questionnaire    Feeling of Stress : To some extent  Social Connections: Moderately Integrated (03/28/2023)   Social Connection and Isolation Panel [NHANES]    Frequency of Communication with Friends and Family: More than three times a week    Frequency of Social Gatherings with Friends and Family: More than three times a week    Attends Religious Services: More than 4 times per year    Active Member of [employer] or Organizations: Yes    Attends Engineer, structural: More than 4 times per year    Marital Status: Divorced    Tobacco Counseling Counseling given: Not Answered   Clinical Intake:  Pre-visit preparation completed: No  Pain : 0-10 Pain Score: 2  Pain Type: Chronic pain Pain Location: Back Pain Orientation: Lower Pain Descriptors / Indicators: Constant Pain Onset: More than a month ago Pain Frequency: Constant Pain Relieving Factors: Rx: Meds Effect of Pain on Daily Activities: Effects activities  Pain Relieving Factors: Rx: Meds  BMI - recorded: 32.71 Nutritional Status: BMI > 30  Obese Nutritional Risks: None Diabetes: No  How often do you need to have someone help you when you read instructions, pamphlets, or other written materials from your doctor or pharmacy?: 1 - Never  Interpreter Needed?: No  Information entered by :: Theresa Mulligan LPN   Activities of Daily Living    03/28/2023    4:05 PM  In your present state of health, do you have any difficulty performing the  following activities:  Hearing? 0  Vision? 0  Difficulty concentrating or making decisions? 0  Walking or climbing stairs? 0  Dressing or bathing? 0  Doing errands, shopping? 0  Preparing Food and eating ? N  Using the Toilet? N  In the past six months, have you accidently leaked urine? N  Do you have problems with loss of bowel control? N  Managing your Medications? N  Managing your Finances? N  Housekeeping or managing your Housekeeping? N    Patient Care Team: Karie Georges, MD as PCP - General (Family Medicine) Corky Crafts, MD as PCP - Cardiology (Cardiology) Verner Chol, [facility] (Inactive) as Pharmacist (Pharmacist) Juanell Fairly, RN as Case Manager Chilton Greathouse, MD as Consulting Physician (Pulmonary Disease)  Indicate any recent Medical Services you may have received from other than [facility] providers in the past year (date may be approximate).     Assessment:   This is a routine wellness examination for Ruvi.  Hearing/Vision screen Hearing Screening - Comments:: Denies hearing difficulties   Vision Screening - Comments:: Wears reading glasses - up to date with routine eye exams with  Dr Dione Booze  Dietary issues and exercise activities discussed:     Goals Addressed               This Visit's Progress     Get back to walking normally (pt-stated)         Depression Screen    03/28/2023    4:03 PM 01/13/2023    2:34 PM 12/23/2022    1:53 PM 10/17/2022    3:54 PM 06/27/2022    1:39 PM 02/05/2022    3:00 PM 02/01/2022    3:23 PM  PHQ 2/9 Scores  PHQ - 2 Score 2  2 1 4  0 0  PHQ- 9 Score 5  15  18  0 0  Exception Documentation  Patient refusal  Fall Risk    03/28/2023    4:06 PM 01/13/2023    2:34 PM 10/17/2022    3:58 PM 06/27/2022    1:38 PM 02/05/2022    3:03 PM  Fall Risk   Falls in the past year? 0 0 0 0 0  Number falls in past yr: 0 0 0 0 0  Injury with Fall? 0 0 0 0 0  Risk for fall due to : No Fall Risks  Medication side  effect;Orthopedic patient No Fall Risks No Fall Risks  Follow up Falls prevention discussed Falls evaluation completed Falls prevention discussed Falls evaluation completed     MEDICARE RISK AT HOME:  Medicare Risk at Home - 03/28/23 1612     Any stairs in or around the home? Yes    If so, are there any without handrails? No    Home free of loose throw rugs in walkways, pet beds, electrical cords, etc? Yes    Adequate lighting in your home to reduce risk of falls? Yes    Life alert? No    Use of a cane, walker or w/c? No    Grab bars in the bathroom? Yes    Shower chair or bench in shower? Yes    Elevated toilet seat or a handicapped toilet? Yes             TIMED UP AND GO:  Was the test performed?  No    Cognitive Function:        03/28/2023    4:08 PM 02/05/2022    3:05 PM 01/24/2021    1:38 PM  6CIT Screen  What Year? 0 points 0 points 0 points  What month? 0 points 0 points 0 points  What time? 0 points 0 points   Count back from 20 0 points 0 points 0 points  Months in reverse 0 points 0 points 0 points  Repeat phrase 0 points 0 points 0 points  Total Score 0 points 0 points     Immunizations Immunization History  Administered Date(s) Administered   Influenza, High Dose Seasonal PF 08/25/2018   Moderna Sars-Covid-2 Vaccination 11/08/2019, 12/05/2019, 10/09/2020   Pneumococcal Conjugate-13 09/23/2018   Pneumococcal Polysaccharide-23 08/27/2016   Tdap 10/07/2012   Yellow Fever 07/05/2008    TDAP status: Due, Education has been provided regarding the importance of this vaccine. Advised may receive this vaccine at local pharmacy or Health Dept. Aware to provide a copy of the vaccination record if obtained from local pharmacy or Health Dept. Verbalized acceptance and understanding.    Pneumococcal vaccine status: Due, Education has been provided regarding the importance of this vaccine. Advised may receive this vaccine at local pharmacy or Health Dept. Aware to  provide a copy of the vaccination record if obtained from local pharmacy or Health Dept. Verbalized acceptance and understanding.  Covid-19 vaccine status: Completed vaccines  Qualifies for Shingles Vaccine? Yes   Zostavax completed No   Shingrix Completed?: No.    Education has been provided regarding the importance of this vaccine. Patient has been advised to call insurance company to determine out of pocket expense if they have not yet received this vaccine. Advised may also receive vaccine at local pharmacy or Health Dept. Verbalized acceptance and understanding.  Screening Tests Health Maintenance  Topic Date Due   DTaP/Tdap/Td (2 - Td or Tdap) 10/07/2022   COVID-19 Vaccine (4 - 2023-24 season) 04/13/2023 (Originally 06/07/2022)   Zoster Vaccines- Shingrix (1 of 2) 06/28/2023 (Originally 01/24/1970)  Pneumonia Vaccine 54+ Years old (3 of 3 - PPSV23 or PCV20) 03/27/2024 (Originally 08/27/2021)   INFLUENZA VACCINE  05/08/2023   Lung Cancer Screening  07/18/2023   Medicare Annual Wellness (AWV)  03/27/2024   MAMMOGRAM  10/22/2024   Colonoscopy  08/01/2027   DEXA SCAN  Completed   Hepatitis C Screening  Completed   HPV VACCINES  Aged Out    Health Maintenance  Health Maintenance Due  Topic Date Due   DTaP/Tdap/Td (2 - Td or Tdap) 10/07/2022    Colorectal cancer screening: Type of screening: Colonoscopy. Completed 07/31/22. Repeat every 6 years  Mammogram status: Completed 10/22/22. Repeat every year  Bone Density status: Completed 01/23/23. Results reflect: Bone density results: OSTEOPOROSIS. Repeat every   years.  Lung Cancer Screening: (Low Dose CT Chest recommended if Age 5-80 years, 20 pack-year currently smoking OR have quit w/in 15years.) does not qualify.     Additional Screening:  Hepatitis C Screening: does qualify; Completed 12/11/18  Vision Screening: Recommended annual ophthalmology exams for early detection of glaucoma and other disorders of the eye. Is the  patient up to date with their annual eye exam?  Yes  Who is the provider or what is the name of the office in which the patient attends annual eye exams? Dr Dione Booze If pt is not established with a provider, would they like to be referred to a provider to establish care? No .   Dental Screening: Recommended annual dental exams for proper oral hygiene    Community Resource Referral / Chronic Care Management:  CRR required this visit?  No   CCM required this visit?  No     Plan:     I have personally reviewed and noted the following in the patient's chart:   Medical and social history Use of alcohol, tobacco or illicit drugs  Current medications and supplements including opioid prescriptions. Patient is currently taking opioid prescriptions. Information provided to patient regarding non-opioid alternatives. Patient advised to discuss non-opioid treatment plan with their provider. Functional ability and status Nutritional status Physical activity Advanced directives List of other physicians Hospitalizations, surgeries, and ER visits in previous 12 months Vitals Screenings to include cognitive, depression, and falls Referrals and appointments  In addition, I have reviewed and discussed with patient certain preventive protocols, quality metrics, and best practice recommendations. A written personalized care plan for preventive services as well as general preventive health recommendations were provided to patient.     [name], LPN   [id number]   After Visit Summary: (MyChart) Due to this being a telephonic visit, the after visit summary with patients personalized plan was offered to patient via MyChart   Nurse Notes: None

## 2023-03-28 NOTE — Telephone Encounter (Signed)
Patient called in at 3:45p for 2p AWV. Visit completed.

## 2023-03-28 NOTE — Patient Instructions (Addendum)
Judy [name] , Thank you for taking time to come for your Medicare Wellness Visit. I appreciate your ongoing commitment to your health goals. Please review the following plan we discussed and let me know if I can assist you in the future.   These are the goals we discussed:  Goals       Get back to walking normally (pt-stated)      Goal: CCM (COPD) Expected Outcome:  Monitor, Self-Manage And Reduce Symptoms of COPD      Current Barriers:  Chronic Disease Management support and education needs related to COPD  Planned Interventions: Reviewed provider's plan for COPD management.  Reviewed medications. Reports consistently using inhaler as prescribed.  Reviewed symptoms. Reports symptoms have been well controlled with prescribed inhalers. Reports recently experiencing a severe cold, cough and sinus congestion. Reports cold symptoms and congestion has cleared. Reports cough has resolved. Reports oxygen saturations have remained stable in the high 90s. Reviewed importance of daily self-assessment. Reminded of need to make an appointment if experiencing moderate symptoms for greater than 48 hours without improvement.  Discussed activity tolerance. Reports going to the gym approximately three days a week. Reports engaging in low impact exercises with complications. Reviewed information regarding infection prevention and increased risk r/t COPD. Advised to utilize prevention strategies to reduce risk of respiratory infection.  Anticipates follow-up with Dr. Mannam/Pulmonology in May or June. Pending call from Pulmonology staff to confirm schedule. Reviewed worsening symptoms that require immediate medical attention.     Symptom Management: Complete appointments with the medical team as scheduled Take medications and use inhalers as prescribed Assess symptoms daily Notify provider if in the yellow zone for 48 hrs without improvement Follow recommendations to prevent respiratory infection Notify  provider or care management team with questions and new concerns as needed   Follow Up Plan:  Will follow up next month       Goal: CCM (Pre-Diabetes) Expected Outcome:  Monitor, Self-Manage And Reduce Symptoms of Pre-Diabetes      Current Barriers:  Chronic Disease Management support and education needs related to Pre-Diabetes  Planned Interventions: Reviewed provider's plan for Pre-Diabetes/Hyperglycemia.  Reviewed nutritional intake. Reports decreased intake over the past three days d/t decreased appetite and diarrhea. Denies nausea, vomiting or stomach pains. Denies blood or unusual color in stool. Reports very limited intake of solid foods. Reports attempting to maintain adequate hydration. Reports using Imodium without improvement. Would like to discuss further with her provider.  Collaborated with the clinic team. Her assigned PCP is out of the office. Team arranged a virtual visit with a covering provider on 01/10/23. Thorough discussion regarding worsening symptoms that require immediate medical attention.    Lab Results  Component Value Date   HGBA1C 6.4 06/27/2022    Symptom Management: Complete virtual visit as scheduled on 01/10/23 Continue engaging in consistent exercise programs Read food labels for fat, fiber, carbohydrates and portion size Complete labs as ordered Call provider office for new concerns or questions    Follow Up Plan:  Will follow up next month         Patient Stated (pt-stated)      Get my strength back.      Track and Manage My Symptoms-COPD      Timeframe:  Long-Range Goal Priority:  Medium Start Date:                             Expected End Date:  Follow Up Date 04/22/22    - develop a rescue plan - follow rescue plan if symptoms flare-up - keep follow-up appointments    Why is this important?   Tracking your symptoms and other information about your health helps your doctor plan your care.  Write down the  symptoms, the time of day, what you were doing and what medicine you are taking.  You will soon learn how to manage your symptoms.     Notes:         This is a list of the screening recommended for you and due dates:  Health Maintenance  Topic Date Due   DTaP/Tdap/Td vaccine (2 - Td or Tdap) 10/07/2022   COVID-19 Vaccine (4 - 2023-24 season) 04/13/2023*   Zoster (Shingles) Vaccine (1 of 2) 06/28/2023*   Pneumonia Vaccine (3 of 3 - PPSV23 or PCV20) 03/27/2024*   Flu Shot  05/08/2023   Screening for Lung Cancer  07/18/2023   Medicare Annual Wellness Visit  03/27/2024   Mammogram  10/22/2024   Colon Cancer Screening  08/01/2027   DEXA scan (bone density measurement)  Completed   Hepatitis C Screening  Completed   HPV Vaccine  Aged Out  *Topic was postponed. The date shown is not the original due date.   Opioid Pain Medicine Management Opioids are powerful medicines that are used to treat moderate to severe pain. When used for short periods of time, they can help you to: Sleep better. Do better in physical or occupational therapy. Feel better in the first few days after an injury. Recover from surgery. Opioids should be taken with the supervision of a trained health care provider. They should be taken for the shortest period of time possible. This is because opioids can be addictive, and the longer you take opioids, the greater your risk of addiction. This addiction can also be called opioid use disorder. What are the risks? Using opioid pain medicines for longer than 3 days increases your risk of side effects. Side effects include: Constipation. Nausea and vomiting. Breathing difficulties (respiratory depression). Drowsiness. Confusion. Opioid use disorder. Itching. Taking opioid pain medicine for a long period of time can affect your ability to do daily tasks. It also puts you at risk for: Motor vehicle crashes. Depression. Suicide. Heart attack. Overdose, which can be  life-threatening. What is a pain treatment plan? A pain treatment plan is an agreement between you and your health care provider. Pain is unique to each person, and treatments vary depending on your condition. To manage your pain, you and your health care provider need to work together. To help you do this: Discuss the goals of your treatment, including how much pain you might expect to have and how you will manage the pain. Review the risks and benefits of taking opioid medicines. Remember that a good treatment plan uses more than one approach and minimizes the chance of side effects. Be honest about the amount of medicines you take and about any drug or alcohol use. Get pain medicine prescriptions from only one health care provider. Pain can be managed with many types of alternative treatments. Ask your health care provider to refer you to one or more specialists who can help you manage pain through: Physical or occupational therapy. Counseling (cognitive behavioral therapy). Good nutrition. Biofeedback. Massage. Meditation. Non-opioid medicine. Following a gentle exercise program. How to use opioid pain medicine Taking medicine Take your pain medicine exactly as told by your health care provider. Take it only when  you need it. If your pain gets less severe, you may take less than your prescribed dose if your health care provider approves. If you are not having pain, do nottake pain medicine unless your health care provider tells you to take it. If your pain is severe, do nottry to treat it yourself by taking more pills than instructed on your prescription. Contact your health care provider for help. Write down the times when you take your pain medicine. It is easy to become confused while on pain medicine. Writing the time can help you avoid overdose. Take other over-the-counter or prescription medicines only as told by your health care provider. Keeping yourself and others safe  While  you are taking opioid pain medicine: Do not drive, use machinery, or power tools. Do not sign legal documents. Do not drink alcohol. Do not take sleeping pills. Do not supervise children by yourself. Do not do activities that require climbing or being in high places. Do not go to a lake, river, ocean, spa, or swimming pool. Do not share your pain medicine with anyone. Keep pain medicine in a locked cabinet or in a secure area where pets and children cannot reach it. Stopping your use of opioids If you have been taking opioid medicine for more than a few weeks, you may need to slowly decrease (taper) how much you take until you stop completely. Tapering your use of opioids can decrease your risk of symptoms of withdrawal, such as: Pain and cramping in the abdomen. Nausea. Sweating. Sleepiness. Restlessness. Uncontrollable shaking (tremors). Cravings for the medicine. Do not attempt to taper your use of opioids on your own. Talk with your health care provider about how to do this. Your health care provider may prescribe a step-down schedule based on how much medicine you are taking and how long you have been taking it. Getting rid of leftover pills Do not save any leftover pills. Get rid of leftover pills safely by: Taking the medicine to a prescription take-back program. This is usually offered by the county or law enforcement. Bringing them to a pharmacy that has a drug disposal container. Flushing them down the toilet. Check the label or package insert of your medicine to see whether this is safe to do. Throwing them out in the trash. Check the label or package insert of your medicine to see whether this is safe to do. If it is safe to throw it out, remove the medicine from the original container, put it into a sealable bag or container, and mix it with used coffee grounds, food scraps, dirt, or cat litter before putting it in the trash. Follow these instructions at home: Activity Do  exercises as told by your health care provider. Avoid activities that make your pain worse. Return to your normal activities as told by your health care provider. Ask your health care provider what activities are safe for you. General instructions You may need to take these actions to prevent or treat constipation: Drink enough fluid to keep your urine pale yellow. Take over-the-counter or prescription medicines. Eat foods that are high in fiber, such as beans, whole grains, and fresh fruits and vegetables. Limit foods that are high in fat and processed sugars, such as fried or sweet foods. Keep all follow-up visits. This is important. Where to find support If you have been taking opioids for a long time, you may benefit from receiving support for quitting from a local support group or counselor. Ask your health care provider  for a referral to these resources in your area. Where to find more information Centers for Disease Control and Prevention (CDC): FootballExhibition.com.br [place] Food and Drug Administration (FDA): PumpkinSearch.com.ee Get help right away if: You may have taken too much of an opioid (overdosed). Common symptoms of an overdose: Your breathing is slower or more shallow than normal. You have a very slow heartbeat (pulse). You have slurred speech. You have nausea and vomiting. Your pupils become very small. You have other potential symptoms: You are very confused. You faint or feel like you will faint. You have cold, clammy skin. You have blue lips or fingernails. You have thoughts of harming yourself or harming others. These symptoms may represent a serious problem that is an emergency. Do not wait to see if the symptoms will go away. Get medical help right away. Call your local emergency services (911 in the [place] Do not drive yourself to the hospital.  If you ever feel like you may hurt yourself or others, or have thoughts about taking your own life, get help right away. Go to your nearest  emergency department or: Call your local emergency services (911 in the [place] Call the [facility] (773-757-4684 in the [place] Call a suicide crisis helpline, such as the National Suicide Prevention Lifeline at 248-747-1951 or 988 in the [place] This is open 24 hours a day in the [place] Text the Crisis Text Line at 937 734 4422 (in the [place] Summary Opioid medicines can help you manage moderate to severe pain for a short period of time. A pain treatment plan is an agreement between you and your health care provider. Discuss the goals of your treatment, including how much pain you might expect to have and how you will manage the pain. If you think that you or someone else may have taken too much of an opioid, get medical help right away. This information is not intended to replace advice given to you by your health care provider. Make sure you discuss any questions you have with your health care provider. Document Revised: 04/18/2021 Document Reviewed: 01/03/2021 Elsevier Patient Education  2024 Elsevier Inc.  Advanced directives: Please bring a copy of your health care power of attorney and living will to the office to be added to your chart at your convenience.   Conditions/risks identified: None  Next appointment: Follow up in one year for your annual wellness visit    Preventive Care 65 Years and Older, Female Preventive care refers to lifestyle choices and visits with your health care provider that can promote health and wellness. What does preventive care include? A yearly physical exam. This is also called an annual well check. Dental exams once or twice a year. Routine eye exams. Ask your health care provider how often you should have your eyes checked. Personal lifestyle choices, including: Daily care of your teeth and gums. Regular physical activity. Eating a healthy diet. Avoiding tobacco and drug use. Limiting alcohol use. Practicing safe sex. Taking  low-dose aspirin every day. Taking vitamin and mineral supplements as recommended by your health care provider. What happens during an annual well check? The services and screenings done by your health care provider during your annual well check will depend on your age, overall health, lifestyle risk factors, and family history of disease. Counseling  Your health care provider may ask you questions about your: Alcohol use. Tobacco use. Drug use. Emotional well-being. Home and relationship well-being. Sexual activity. Eating habits. History of falls. Memory and ability to  understand (cognition). Work and work Astronomer. Reproductive health. Screening  You may have the following tests or measurements: Height, weight, and BMI. Blood pressure. Lipid and cholesterol levels. These may be checked every 5 years, or more frequently if you are over 16 years old. Skin check. Lung cancer screening. You may have this screening every year starting at age 73 if you have a 30-pack-year history of smoking and currently smoke or have quit within the past 15 years. Fecal occult blood test (FOBT) of the stool. You may have this test every year starting at age 88. Flexible sigmoidoscopy or colonoscopy. You may have a sigmoidoscopy every 5 years or a colonoscopy every 10 years starting at age 28. Hepatitis C blood test. Hepatitis B blood test. Sexually transmitted disease (STD) testing. Diabetes screening. This is done by checking your blood sugar (glucose) after you have not eaten for a while (fasting). You may have this done every 1-3 years. Bone density scan. This is done to screen for osteoporosis. You may have this done starting at age 73. Mammogram. This may be done every 1-2 years. Talk to your health care provider about how often you should have regular mammograms. Talk with your health care provider about your test results, treatment options, and if necessary, the need for more tests. Vaccines   Your health care provider may recommend certain vaccines, such as: Influenza vaccine. This is recommended every year. Tetanus, diphtheria, and acellular pertussis (Tdap, Td) vaccine. You may need a Td booster every 10 years. Zoster vaccine. You may need this after age 39. Pneumococcal 13-valent conjugate (PCV13) vaccine. One dose is recommended after age 76. Pneumococcal polysaccharide (PPSV23) vaccine. One dose is recommended after age 78. Talk to your health care provider about which screenings and vaccines you need and how often you need them. This information is not intended to replace advice given to you by your health care provider. Make sure you discuss any questions you have with your health care provider. Document Released: 10/20/2015 Document Revised: 06/12/2016 Document Reviewed: 07/25/2015 Elsevier Interactive Patient Education  2017 [employer].  Fall Prevention in the Home Falls can cause injuries. They can happen to people of all ages. There are many things you can do to make your home safe and to help prevent falls. What can I do on the outside of my home? Regularly fix the edges of walkways and driveways and fix any cracks. Remove anything that might make you trip as you walk through a door, such as a raised step or threshold. Trim any bushes or trees on the path to your home. Use bright outdoor lighting. Clear any walking paths of anything that might make someone trip, such as rocks or tools. Regularly check to see if handrails are loose or broken. Make sure that both sides of any steps have handrails. Any raised decks and porches should have guardrails on the edges. Have any leaves, snow, or ice cleared regularly. Use sand or salt on walking paths during winter. Clean up any spills in your garage right away. This includes oil or grease spills. What can I do in the bathroom? Use night lights. Install grab bars by the toilet and in the tub and shower. Do not use towel  bars as grab bars. Use non-skid mats or decals in the tub or shower. If you need to sit down in the shower, use a plastic, non-slip stool. Keep the floor dry. Clean up any water that spills on the floor as soon as it  happens. Remove soap buildup in the tub or shower regularly. Attach bath mats securely with double-sided non-slip rug tape. Do not have throw rugs and other things on the floor that can make you trip. What can I do in the bedroom? Use night lights. Make sure that you have a light by your bed that is easy to reach. Do not use any sheets or blankets that are too big for your bed. They should not hang down onto the floor. Have a firm chair that has side arms. You can use this for support while you get dressed. Do not have throw rugs and other things on the floor that can make you trip. What can I do in the kitchen? Clean up any spills right away. Avoid walking on wet floors. Keep items that you use a lot in easy-to-reach places. If you need to reach something above you, use a strong step stool that has a grab bar. Keep electrical cords out of the way. Do not use floor polish or wax that makes floors slippery. If you must use wax, use non-skid floor wax. Do not have throw rugs and other things on the floor that can make you trip. What can I do with my stairs? Do not leave any items on the stairs. Make sure that there are handrails on both sides of the stairs and use them. Fix handrails that are broken or loose. Make sure that handrails are as long as the stairways. Check any carpeting to make sure that it is firmly attached to the stairs. Fix any carpet that is loose or worn. Avoid having throw rugs at the top or bottom of the stairs. If you do have throw rugs, attach them to the floor with carpet tape. Make sure that you have a light switch at the top of the stairs and the bottom of the stairs. If you do not have them, ask someone to add them for you. What else can I do to help  prevent falls? Wear shoes that: Do not have high heels. Have rubber bottoms. Are comfortable and fit you well. Are closed at the toe. Do not wear sandals. If you use a stepladder: Make sure that it is fully opened. Do not climb a closed stepladder. Make sure that both sides of the stepladder are locked into place. Ask someone to hold it for you, if possible. Clearly mark and make sure that you can see: Any grab bars or handrails. First and last steps. Where the edge of each step is. Use tools that help you move around (mobility aids) if they are needed. These include: Canes. Walkers. Scooters. Crutches. Turn on the lights when you go into a dark area. Replace any light bulbs as soon as they burn out. Set up your furniture so you have a clear path. Avoid moving your furniture around. If any of your floors are uneven, fix them. If there are any pets around you, be aware of where they are. Review your medicines with your doctor. Some medicines can make you feel dizzy. This can increase your chance of falling. Ask your doctor what other things that you can do to help prevent falls. This information is not intended to replace advice given to you by your health care provider. Make sure you discuss any questions you have with your health care provider. Document Released: 07/20/2009 Document Revised: 02/29/2016 Document Reviewed: 10/28/2014 Elsevier Interactive Patient Education  2017 [employer].

## 2023-03-31 NOTE — Procedures (Signed)
Lumbar Facet Joint Nerve Denervation  [name]      Date of Birth: 1951-05-11 MRN: [id number] PCP: Karie Georges, MD      Visit Date: 03/18/2023   Universal Protocol:    Date/Time: 06/24/247:35 PM  Consent Given By: the patient  Position: PRONE  Additional Comments: Vital signs were monitored before and after the procedure. Patient was prepped and draped in the usual sterile fashion. The correct patient, procedure, and site was verified.   Injection Procedure Details:   Procedure diagnoses:  1. Lumbar radiculopathy      Meds Administered:  Meds ordered this encounter  Medications   methylPREDNISolone acetate (DEPO-MEDROL) injection 80 mg     Laterality: Bilateral  Location/Site:  L3-L4, L2 and L3 medial branches  Needle: 18 ga.,  10mm active tip, RF Cannula  Needle Placement: Along juncture of superior articular process and transverse pocess  Findings:  -Comments:  Procedure Details: For each desired target nerve, the corresponding transverse process (sacral ala for the L5 dorsal rami) was identified and the fluoroscope was positioned to square off the endplates of the corresponding vertebral body to achieve a true AP midline view.  The beam was then obliqued 15 to 20 degrees and caudally tilted 15 to 20 degrees to line up a trajectory along the target nerves. The skin over the target of the junction of superior articulating process and transverse process (sacral ala for the L5 dorsal rami) was infiltrated with 1ml of 1% Lidocaine without Epinephrine.  The 18 gauge 10mm active tip outer cannula was advanced in trajectory view to the target.  This procedure was repeated for each target nerve.  Then, for all levels, the outer cannula placement was fine-tuned and the position was then confirmed with bi-planar imaging.    Test stimulation was done both at sensory and motor levels to ensure there was no radicular stimulation. The target tissues  were then infiltrated with 1 ml of 1% Lidocaine without Epinephrine. Subsequently, a percutaneous neurotomy was carried out for 90 seconds at 80 degrees Celsius.  After the completion of the lesion, 1 ml of injectate was delivered. It was then repeated for each facet joint nerve mentioned above. Appropriate radiographs were obtained to verify the probe placement during the neurotomy.   Additional Comments:  No complications occurred Dressing: 2 x 2 sterile gauze and Band-Aid    Post-procedure details: Patient was observed during the procedure. Post-procedure instructions were reviewed.  Patient left the clinic in stable condition.

## 2023-03-31 NOTE — Progress Notes (Signed)
Judy [name] - 72 y.o. female MRN [id number]  Date of birth: March 27, 1951  Office Visit Note: Visit Date: 03/18/2023 PCP: Karie Georges, MD Referred by: Karie Georges, MD  Subjective: Chief Complaint  Patient presents with   Lower Back - Pain   HPI:  Judy [name] is a 72 y.o. female who comes in todayfor planned radiofrequency ablation of the Bilateral L3-4 Lumbar facet joints. This would be ablation of the corresponding medial branches and/or dorsal rami.  Patient has had double diagnostic blocks with more than 50% relief.  These are documented on pain diary.  They have had chronic back pain for quite some time, more than 3 months, which has been an ongoing situation with recalcitrant axial back pain.  They have no radicular pain.  Their axial pain is worse with standing and ambulating and on exam today with facet loading.  They have had physical therapy as well as home exercise program.  The imaging noted in the chart below indicated facet pathology. Accordingly they meet all the criteria and qualification for for radiofrequency ablation and we are going to complete this today hopefully for more longer term relief as part of comprehensive management program.   ROS Otherwise per HPI.  Assessment & Plan: Visit Diagnoses:    ICD-10-CM   1. Lumbar radiculopathy  M54.16 XR C-ARM NO REPORT    methylPREDNISolone acetate (DEPO-MEDROL) injection 80 mg    Radiofrequency,Lumbar    CANCELED: Epidural Steroid injection      Plan: No additional findings.   Meds & Orders:  Meds ordered this encounter  Medications   methylPREDNISolone acetate (DEPO-MEDROL) injection 80 mg    Orders Placed This Encounter  Procedures   Radiofrequency,Lumbar   XR C-ARM NO REPORT    Follow-up: Return if symptoms worsen or fail to improve.   Procedures: No procedures performed  Lumbar Facet Joint Nerve Denervation  [name]      Date of Birth: Mar 15, 1951 MRN:  [id number] PCP: Karie Georges, MD      Visit Date: 03/18/2023   Universal Protocol:    Date/Time: 06/24/247:35 PM  Consent Given By: the patient  Position: PRONE  Additional Comments: Vital signs were monitored before and after the procedure. Patient was prepped and draped in the usual sterile fashion. The correct patient, procedure, and site was verified.   Injection Procedure Details:   Procedure diagnoses:  1. Lumbar radiculopathy      Meds Administered:  Meds ordered this encounter  Medications   methylPREDNISolone acetate (DEPO-MEDROL) injection 80 mg     Laterality: Bilateral  Location/Site:  L3-L4, L2 and L3 medial branches  Needle: 18 ga.,  10mm active tip, RF Cannula  Needle Placement: Along juncture of superior articular process and transverse pocess  Findings:  -Comments:  Procedure Details: For each desired target nerve, the corresponding transverse process (sacral ala for the L5 dorsal rami) was identified and the fluoroscope was positioned to square off the endplates of the corresponding vertebral body to achieve a true AP midline view.  The beam was then obliqued 15 to 20 degrees and caudally tilted 15 to 20 degrees to line up a trajectory along the target nerves. The skin over the target of the junction of superior articulating process and transverse process (sacral ala for the L5 dorsal rami) was infiltrated with 1ml of 1% Lidocaine without Epinephrine.  The 18 gauge 10mm active tip outer cannula was advanced in trajectory view to the target.  This procedure was repeated for each target nerve.  Then, for all levels, the outer cannula placement was fine-tuned and the position was then confirmed with bi-planar imaging.    Test stimulation was done both at sensory and motor levels to ensure there was no radicular stimulation. The target tissues were then infiltrated with 1 ml of 1% Lidocaine without Epinephrine. Subsequently, a percutaneous  neurotomy was carried out for 90 seconds at 80 degrees Celsius.  After the completion of the lesion, 1 ml of injectate was delivered. It was then repeated for each facet joint nerve mentioned above. Appropriate radiographs were obtained to verify the probe placement during the neurotomy.   Additional Comments:  No complications occurred Dressing: 2 x 2 sterile gauze and Band-Aid    Post-procedure details: Patient was observed during the procedure. Post-procedure instructions were reviewed.  Patient left the clinic in stable condition.      Clinical History: MRI LUMBAR SPINE WITHOUT CONTRAST   TECHNIQUE: Multiplanar, multisequence MR imaging of the lumbar spine was performed. No intravenous contrast was administered.   COMPARISON:  01/06/2020   FINDINGS: Segmentation:  5 lumbar type vertebral bodies.   Alignment:  Mild dextrocurvature.  No significant listhesis.   Vertebrae: No acute fracture or suspicious osseous lesion. Redemonstrated compression deformity of L1, which appears unchanged. Increased T2 signal in the T12-L1 disc, with some loss of cortical signal in the T12-L1 endplates (series 5, image 8-9).   Conus medullaris and cauda equina: Conus extends to the L2 level. Conus and cauda equina appear normal.   Paraspinal and other soft tissues: Atrophy of the right psoas muscle, which is new from the prior exam.   Disc levels:   T12-L1: Minimal disc bulge. Mild facet arthropathy. No spinal canal stenosis or neural foraminal narrowing.   L1-L2: Minimal disc bulge. No spinal canal stenosis or neural foraminal narrowing.   L2-L3: Minimal disc bulge. No spinal canal stenosis or neural foraminal narrowing.   L3-L4: Mild disc bulge, which has progressed from prior exam, with superimposed left paracentral disc extrusion with 12 mm of caudal migration, which is new from the prior exam. Severe facet arthropathy with suspected medially oriented facet synovial  cyst, which measures 2 x 2 x 4 mm (series 13, image 26). Mild-to-moderate spinal canal stenosis. Narrowing of the lateral recesses. Mild left neural foraminal narrowing, which is new from the prior exam.   L4-L5: Disc height loss with small left paracentral disc protrusion, which narrows the left lateral recess. No spinal canal stenosis or neural foraminal narrowing.   L5-S1: Large left lateral osteophyte, which may abut the exiting left L5 nerve. No significant disc bulge. Moderate facet arthropathy. No spinal canal stenosis or neural foraminal narrowing.   IMPRESSION: 1. Increased T2 signal in the T12-L1 disc, with some loss of cortical signal in the T12-L1 endplates. While this could be degenerative in nature, early discitis/osteomyelitis could have a similar appearance. Recommend correlation with inflammatory markers. 2. L3-L4 mild-to-moderate spinal canal stenosis and mild left neural foraminal narrowing, which is new from the prior exam. Narrowing of the lateral recesses at this level could affect the descending L4 nerve roots. 3. L4-L5 narrowing of the left lateral recess, which could affect the descending left L5 nerve. 4. L5-S1 large left lateral osteophyte, which may abut the exiting left L5 nerve. 5. Atrophy of the right psoas muscle, which is new from the prior exam.     Electronically Signed   By: Wiliam Ke M.D.   On: 10/29/2022  12:33     Objective:  VS:  HT:    WT:   BMI:     BP:(!) 144/72  HR:96bpm  TEMP: ( )  RESP:  Physical Exam   Imaging: No results found.

## 2023-04-17 ENCOUNTER — Telehealth: Payer: Self-pay | Admitting: Physical Medicine and Rehabilitation

## 2023-04-17 ENCOUNTER — Ambulatory Visit: Payer: Medicare Other

## 2023-04-17 ENCOUNTER — Telehealth: Payer: Self-pay | Admitting: Physician Assistant

## 2023-04-17 NOTE — Telephone Encounter (Signed)
2 attempt for pt to make an appt with Dr Alvester Morin she stated. Please call pt at [phone].

## 2023-04-17 NOTE — Telephone Encounter (Signed)
Returned call to patient. She states she has injured her back earlier in the week and is having trouble getting around. Patient states she is awaiting a call from the doctor for a possible injection. Patient has been rescheduled for 04/24/2023 at 3:00 pm.

## 2023-04-17 NOTE — Telephone Encounter (Signed)
Spoke with patient and she is having pain on the right side that is starting at the waist and radiating around to the front in the groin. OV scheduled for 04/22/23. She wants to know if she can have medication. She has tried [employer], ice, heat and OTC medications. She uses [facility] (consulting). Please advise

## 2023-04-17 NOTE — Telephone Encounter (Signed)
Pt called requesting an appt with Dr Alvester Morin. Pt states she can barely walk and may need an injection. Please call pt abut this matter at [phone].

## 2023-04-17 NOTE — Telephone Encounter (Signed)
PT called to reschedule her appt with the nurse due to minor injury.

## 2023-04-17 NOTE — Telephone Encounter (Signed)
See previous encounter

## 2023-04-18 ENCOUNTER — Other Ambulatory Visit: Payer: Self-pay | Admitting: Physical Medicine and Rehabilitation

## 2023-04-18 MED ORDER — TRAMADOL HCL 50 MG PO TABS: 50.0000 mg | ORAL_TABLET | Freq: Three times a day (TID) | ORAL | 0 refills | Status: DC | PRN

## 2023-04-22 ENCOUNTER — Ambulatory Visit: Payer: Medicare Other | Admitting: Physical Medicine and Rehabilitation

## 2023-04-22 ENCOUNTER — Encounter: Payer: Self-pay | Admitting: Physical Medicine and Rehabilitation

## 2023-04-22 VITALS — BP 111/69 | HR 80

## 2023-04-22 DIAGNOSIS — M7918 Myalgia, other site: Secondary | ICD-10-CM | POA: Diagnosis not present

## 2023-04-22 DIAGNOSIS — M5416 Radiculopathy, lumbar region: Secondary | ICD-10-CM

## 2023-04-22 DIAGNOSIS — M48061 Spinal stenosis, lumbar region without neurogenic claudication: Secondary | ICD-10-CM

## 2023-04-22 DIAGNOSIS — M47816 Spondylosis without myelopathy or radiculopathy, lumbar region: Secondary | ICD-10-CM | POA: Diagnosis not present

## 2023-04-22 DIAGNOSIS — G894 Chronic pain syndrome: Secondary | ICD-10-CM

## 2023-04-22 NOTE — Progress Notes (Unsigned)
Judy [name] - 72 y.o. female MRN [id number]  Date of birth: 07-05-1951  Office Visit Note: Visit Date: 04/22/2023 PCP: Karie Georges, MD Referred by: Karie Georges, MD  Subjective: Chief Complaint  Patient presents with   Lower Back - Pain   HPI: Judy [name] is a 72 y.o. female who comes in today for evaluation of acute on chronic right sided lower back pain radiating to right groin/anterior thigh. Patient is well known to [place], we have historically treated her for more axial back pain. History of ankylosing spondylitis and chronic joint pain. Radicular pain radiating to right groin ongoing for several weeks, worsens with movement and activity. She describes pain as sore and aching sensation, currently rates as 7 out of 10. Some relief of pain with home exercise regimen, rest and use of medications. Lumbar MRI imaging from January of this year exhibits mild to moderate spinal canal stenosis and bilateral lateral recess stenosis at L3-L4, there is also severe facet arthropathy and medially oriented facet synovial cyst at this level. She recently underwent bilateral L3-L4 radiofrequency ablation in our office on 03/18/2023, she reports good relief of bilateral lower back pain, however this new right sided radicular pain seems to be giving her trouble. She continues to be managed by Dr. Huey Bienenstock with [facility]. She was also recently evaluated and treated by Dr. Scherry Ran with [facility], underwent left genicular nerve block on 04/21/2023. Patient denies focal weakness, numbness and tingling. No recent trauma or falls.   Incidentally, she also mentions severe left upper back/scapular pain that started on Saturday. States pain worsens with movement and activity. Some relief of pain with rest and use of medications. States her pain is causing difficulty sleeping.     {Oswestry Disability Score:26558}  Review of Systems  Musculoskeletal:   Positive for back pain and myalgias.  Neurological:  Negative for tingling, sensory change, focal weakness and weakness.  All other systems reviewed and are negative.  Otherwise per HPI.  Assessment & Plan: Visit Diagnoses:    ICD-10-CM   1. Lumbar radiculopathy  M54.16 Ambulatory referral to Physical Medicine Rehab    2. Facet arthropathy, lumbar  M47.816     3. Spinal stenosis of lumbar region without neurogenic claudication  M48.061     4. Myofascial pain syndrome  M79.18 Ambulatory referral to Physical Therapy    5. Chronic pain syndrome  G89.4        Plan: Findings:  1. Acute on chronic right sided lower back pain radiating to right groin/anterior thigh. Patient continues to have severe pain despite good conservative therapies such as home exercise regimen, rest and use of medications. Patients clinical presentation and exam are consistent with lumbar radiculopathy, more of L3 nerve pattern. There is severe facet arthropathy, lateral recess stenosis and mild to moderate spinal canal stenosis at the level of L3-L4. Next step is to perform diagnostic and hopefully therapeutic right L3 transforaminal epidural steroid injection under fluoroscopic guidance. If good relief of pain with injection we can perform this procedure infrequently as needed. No red flag symptoms noted upon exam today.  2. Acute left upper back/scapular pain. Patient continues to have severe pain despite good conservative therapies such as rest and use of medications. Patients clinical presentation and exam are consistent with myofascial pain syndrome. There is localized area of tenderness noted to left trapezius region. Patient does have diffuse joint and body pain. I also feel there could be a  central sensitization syndrome such as fibromyalgia contributing to her symptom. Next step is to place referral for formal physical therapy, I do feel she would benefit from manual treatments and dry needling.     Meds & Orders:  No orders of the defined types were placed in this encounter.   Orders Placed This Encounter  Procedures   Ambulatory referral to Physical Medicine Rehab   Ambulatory referral to Physical Therapy    Follow-up: Return for Right L3 transforaminal epidural steroid injection.   Procedures: No procedures performed      Clinical History: MRI LUMBAR SPINE WITHOUT CONTRAST   TECHNIQUE: Multiplanar, multisequence MR imaging of the lumbar spine was performed. No intravenous contrast was administered.   COMPARISON:  01/06/2020   FINDINGS: Segmentation:  5 lumbar type vertebral bodies.   Alignment:  Mild dextrocurvature.  No significant listhesis.   Vertebrae: No acute fracture or suspicious osseous lesion. Redemonstrated compression deformity of L1, which appears unchanged. Increased T2 signal in the T12-L1 disc, with some loss of cortical signal in the T12-L1 endplates (series 5, image 8-9).   Conus medullaris and cauda equina: Conus extends to the L2 level. Conus and cauda equina appear normal.   Paraspinal and other soft tissues: Atrophy of the right psoas muscle, which is new from the prior exam.   Disc levels:   T12-L1: Minimal disc bulge. Mild facet arthropathy. No spinal canal stenosis or neural foraminal narrowing.   L1-L2: Minimal disc bulge. No spinal canal stenosis or neural foraminal narrowing.   L2-L3: Minimal disc bulge. No spinal canal stenosis or neural foraminal narrowing.   L3-L4: Mild disc bulge, which has progressed from prior exam, with superimposed left paracentral disc extrusion with 12 mm of caudal migration, which is new from the prior exam. Severe facet arthropathy with suspected medially oriented facet synovial cyst, which measures 2 x 2 x 4 mm (series 13, image 26). Mild-to-moderate spinal canal stenosis. Narrowing of the lateral recesses. Mild left neural foraminal narrowing, which is new from the prior exam.   L4-L5: Disc height loss with  small left paracentral disc protrusion, which narrows the left lateral recess. No spinal canal stenosis or neural foraminal narrowing.   L5-S1: Large left lateral osteophyte, which may abut the exiting left L5 nerve. No significant disc bulge. Moderate facet arthropathy. No spinal canal stenosis or neural foraminal narrowing.   IMPRESSION: 1. Increased T2 signal in the T12-L1 disc, with some loss of cortical signal in the T12-L1 endplates. While this could be degenerative in nature, early discitis/osteomyelitis could have a similar appearance. Recommend correlation with inflammatory markers. 2. L3-L4 mild-to-moderate spinal canal stenosis and mild left neural foraminal narrowing, which is new from the prior exam. Narrowing of the lateral recesses at this level could affect the descending L4 nerve roots. 3. L4-L5 narrowing of the left lateral recess, which could affect the descending left L5 nerve. 4. L5-S1 large left lateral osteophyte, which may abut the exiting left L5 nerve. 5. Atrophy of the right psoas muscle, which is new from the prior exam.     Electronically Signed   By: Wiliam Ke M.D.   On: 10/29/2022 12:33   She reports that she quit smoking about 2 years ago. Her smoking use included cigarettes. She started smoking about 45 years ago. She has a 53.8 pack-year smoking history. She has never been exposed to tobacco smoke. She has never used smokeless tobacco.  Recent Labs    06/27/22 1723 12/23/22 1317  HGBA1C 6.4 6.2*  Objective:  VS:  HT:    WT:   BMI:     BP:111/69  HR:80bpm  TEMP: ( )  RESP:  Physical Exam Vitals and nursing note reviewed.  HENT:     Head: Normocephalic and atraumatic.     Right Ear: External ear normal.     Left Ear: External ear normal.     Nose: Nose normal.     Mouth/Throat:     Mouth: Mucous membranes are moist.  Eyes:     Extraocular Movements: Extraocular movements intact.  Cardiovascular:     Rate and Rhythm: Normal  rate.     Pulses: Normal pulses.  Pulmonary:     Effort: Pulmonary effort is normal.  Abdominal:     General: Abdomen is flat. There is no distension.  Musculoskeletal:        General: Tenderness present.     Cervical back: Normal range of motion.     Comments: Patient rises from seated position to standing without difficulty. Good lumbar range of motion. No pain noted with facet loading. 5/5 strength noted with bilateral hip flexion, knee flexion/extension, ankle dorsiflexion/plantarflexion and EHL. No clonus noted bilaterally. No pain upon palpation of greater trochanters. No pain with internal/external rotation of bilateral hips. Sensation intact bilaterally. Dysesthesias noted to right L3 dermatome. Tenderness noted to left trapezius region. Negative slump test bilaterally. Ambulates without aid, gait steady.     Skin:    General: Skin is warm and dry.     Capillary Refill: Capillary refill takes less than 2 seconds.  Neurological:     General: No focal deficit present.     Mental Status: She is alert and oriented to person, place, and time.  Psychiatric:        Mood and Affect: Mood normal.        Behavior: Behavior normal.     Ortho Exam  Imaging: No results found.  Past Medical/Family/Surgical/Social History: Medications & Allergies reviewed per EMR, new medications updated. 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   Status post total replacement of left hip 03/19/2019   PTSD (post-traumatic stress disorder)    COPD (chronic obstructive pulmonary disease) (HCC)    Bipolar 1 disorder (HCC)    Anxiety    B12 deficiency 12/09/2018   Hyperglycemia 04/14/2017   Past Medical  History:  Diagnosis Date   Anxiety    Arthritis    Avascular necrosis (HCC)    Left hip   Avascular necrosis of bone of hip, 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   Bipolar 1 disorder (HCC)    COPD (chronic obstructive pulmonary disease) (HCC)    Depression    Dyspnea    Pneumonia    PTSD (post-traumatic stress disorder)    Family History  Problem Relation Age of Onset   CAD Mother 74   COPD Mother    Depression Mother    Heart attack Father    Colon polyps Sister    Cancer Maternal Aunt        growth on face- underwent chemo   Heart attack Maternal Grandmother    Early death Maternal Grandfather    Hearing  loss Paternal Grandfather    Asthma Daughter    Bipolar disorder Son    Past Surgical History:  Procedure Laterality Date   bone spurs foot Right    CATARACT EXTRACTION Bilateral    COLONOSCOPY     COLONOSCOPY  07/2022   HAND SURGERY Left    JOINT REPLACEMENT     TONSILLECTOMY     TONSILLECTOMY AND ADENOIDECTOMY     TOTAL HIP ARTHROPLASTY Left 03/19/2019   Procedure: LEFT TOTAL HIP ARTHROPLASTY ANTERIOR APPROACH;  Surgeon: [name], MD;  Location: [facility];  Service: Orthopedics;  Laterality: Left;   TOTAL HIP ARTHROPLASTY Right 06/04/2019   Procedure: RIGHT TOTAL HIP ARTHROPLASTY ANTERIOR APPROACH;  Surgeon: [name], MD;  Location: [facility];  Service: Orthopedics;  Laterality: Right;   TOTAL KNEE ARTHROPLASTY Left 12/28/2021   Procedure: Left TOTAL KNEE ARTHROPLASTY;  Surgeon: [name], MD;  Location: [facility];  Service: Orthopedics;  Laterality: Left;   Social History   Occupational History   Occupation: retired  Tobacco Use   Smoking status: Former    Current packs/day: 0.00    Average packs/day: 1.3 packs/day for 43.0 years (53.8 ttl pk-yrs)    Types: Cigarettes    Start date: 05/19/1977    Quit date: 05/19/2020    Years since quitting: 2.9    Passive exposure: Never    Smokeless tobacco: Never  Vaping Use   Vaping status: Never Used  Substance and Sexual Activity   Alcohol use: Yes    Comment: occ   Drug use: No   Sexual activity: Not on file

## 2023-04-22 NOTE — Progress Notes (Signed)
Right sided lbp . started Tuesday. Says pain goes into the groin. Pain comes and goes. Feels like leg is wanting to give out at times.  Pt stated the worse pain today is in her left shoulder blade. Pain in neck at times.   Tried ibu,tylenol, aleve, hydrocodone   Pain level 10/10

## 2023-04-24 ENCOUNTER — Ambulatory Visit: Payer: Medicare Other

## 2023-04-27 ENCOUNTER — Other Ambulatory Visit: Payer: Self-pay | Admitting: Acute Care

## 2023-04-27 DIAGNOSIS — Z122 Encounter for screening for malignant neoplasm of respiratory organs: Secondary | ICD-10-CM

## 2023-04-27 DIAGNOSIS — Z87891 Personal history of nicotine dependence: Secondary | ICD-10-CM

## 2023-04-30 ENCOUNTER — Other Ambulatory Visit: Payer: Self-pay

## 2023-04-30 ENCOUNTER — Ambulatory Visit (INDEPENDENT_AMBULATORY_CARE_PROVIDER_SITE_OTHER): Payer: Medicare Other | Admitting: Physical Therapy

## 2023-04-30 ENCOUNTER — Encounter: Payer: Self-pay | Admitting: Physical Therapy

## 2023-04-30 DIAGNOSIS — M6281 Muscle weakness (generalized): Secondary | ICD-10-CM | POA: Diagnosis not present

## 2023-04-30 DIAGNOSIS — M546 Pain in thoracic spine: Secondary | ICD-10-CM

## 2023-04-30 DIAGNOSIS — G8929 Other chronic pain: Secondary | ICD-10-CM

## 2023-04-30 DIAGNOSIS — R293 Abnormal posture: Secondary | ICD-10-CM

## 2023-04-30 DIAGNOSIS — M25562 Pain in left knee: Secondary | ICD-10-CM | POA: Diagnosis not present

## 2023-04-30 DIAGNOSIS — R262 Difficulty in walking, not elsewhere classified: Secondary | ICD-10-CM

## 2023-04-30 NOTE — Therapy (Signed)
OUTPATIENT PHYSICAL THERAPY EVALUATION   Patient Name: Judy [name] MRN: [id number] DOB:[birth year], 72 y.o., female Today's Date: 04/30/2023  END OF SESSION:  PT End of Session - 04/30/23 1337     Visit Number 1    Number of Visits 12    Date for PT Re-Evaluation 06/11/23    Authorization Type Medicare/[employer]    Progress Note Due on Visit 10    PT Start Time 1302    PT Stop Time 1335    PT Time Calculation (min) 33 min    Activity Tolerance Patient tolerated treatment well;Patient limited by fatigue    Behavior During Therapy WFL for tasks assessed/performed             Past Medical History:  Diagnosis Date   Anxiety    Arthritis    Avascular necrosis (HCC)    Left hip   Avascular necrosis of bone of hip, 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   Bipolar 1 disorder (HCC)    COPD (chronic obstructive pulmonary disease) (HCC)    Depression    Dyspnea    Pneumonia    PTSD (post-traumatic stress disorder)    Past Surgical History:  Procedure Laterality Date   bone spurs foot Right    CATARACT EXTRACTION Bilateral    COLONOSCOPY     COLONOSCOPY  07/2022   HAND SURGERY Left    JOINT REPLACEMENT     TONSILLECTOMY     TONSILLECTOMY AND ADENOIDECTOMY     TOTAL HIP ARTHROPLASTY Left 03/19/2019   Procedure: LEFT TOTAL HIP ARTHROPLASTY ANTERIOR APPROACH;  Surgeon: [name], MD;  Location: [facility];  Service: Orthopedics;  Laterality: Left;   TOTAL HIP ARTHROPLASTY Right 06/04/2019   Procedure: RIGHT TOTAL HIP ARTHROPLASTY ANTERIOR APPROACH;  Surgeon: [name], MD;  Location: [facility];  Service: Orthopedics;  Laterality: Right;   TOTAL KNEE ARTHROPLASTY Left 12/28/2021   Procedure: Left TOTAL KNEE ARTHROPLASTY;  Surgeon: [name], MD;  Location: [facility];  Service: Orthopedics;  Laterality: Left;   Patient Active Problem List   Diagnosis Date Noted   Prediabetes 08/09/2022    Restless legs syndrome 08/09/2022   Status post total left knee replacement 12/28/2021   Primary osteoarthritis of left knee 10/25/2021   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) 11/30/2020   Status post total replacement of right hip 06/04/2019   Status post total replacement of left hip 03/19/2019   PTSD (post-traumatic stress disorder)    COPD (chronic obstructive pulmonary disease) (HCC)    Bipolar 1 disorder (HCC)    Anxiety    B12 deficiency 12/09/2018   Hyperglycemia 04/14/2017    PCP: Karie Georges, MD  REFERRING PROVIDER: Juanda Chance, NP  REFERRING DIAG: [phone] (ICD-10-CM) - Myofascial pain syndrome  Rationale for Evaluation and Treatment: Rehabilitation  THERAPY DIAG:  Pain in thoracic spine - Plan: PT plan of care cert/re-cert  Muscle weakness (generalized) - Plan: PT plan of care cert/re-cert  Chronic pain of left knee - Plan: PT plan of care cert/re-cert  Difficulty in walking, not elsewhere classified - Plan: PT plan of care cert/re-cert  Abnormal posture - Plan: PT plan of care cert/re-cert  ONSET DATE: 2 weeks ago   SUBJECTIVE:  SUBJECTIVE STATEMENT: Pt reports she "feels totally drained" and everything "cracks" when she walks.  She's had back pain with steroid shots and ablation which has helped.  She has some upper back pain which is why the provider sent her here, but she is also c/o Lt knee pain.  She was going to the gym 3 days a week for 4 months and "it didn't make a bit of difference."  PERTINENT HISTORY:  COPD, PTSD, Rt THA 8/ 2020. Left THA 03/2019, Bipolar disorder, anxiety  PAIN:  Are you having pain? Yes: NPRS scale: 2 currently, up to 10 in upper back; 2 currently in knee/10 Pain location: upper back and Lt knee Pain description:  stabbing; weakness, hot knife and twisting in back Aggravating factors: walking Relieving factors: lying down  PRECAUTIONS:  None  RED FLAGS: None   WEIGHT BEARING RESTRICTIONS:  No  FALLS:  Has patient fallen in last 6 months? 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 and bilateral lateral recess stenosis at L3-L4, there is also severe facet arthropathy and medially oriented facet synovial cyst at this level  PATIENT SURVEYS:  04/30/23: FOTO 33 (predicted 44)  SENSATION: WFL  POSTURE:  rounded shoulders and forward head  GAIT: 04/30/23:  Distance walked: 100' Assistive device utilized: None Level of assistance: Modified independence Comments: antalgic gait   BACK  PALPATION: 04/30/23: very tender to palpation along Lt rhomboids   UPPER EXTREMITY MMT:  MMT Right eval Left eval  Shoulder flexion 3/5 3/5 with pain  Shoulder extension    Shoulder abduction 3/5 3/5 with pain  Shoulder adduction    Shoulder extension    Shoulder internal rotation    Shoulder external rotation     (Blank rows = not tested)   LUMBAR ROM:   ROM A/PROM  eval  Flexion Limited  50%  Extension WNL  Right Quadrant Limited 25% with pain  Left Quadrant Limited  25%   (Blank rows = not tested)   Lower Extremity Right 04/30/2023 Left 04/30/2023   A/PROM MMT A/PROM MMT  Hip Flexion  3/5  3/5  Hip Extension      Hip Abduction      Hip Adduction      Hip Internal rotation      Hip External rotation      Knee Flexion  4/5  4/5  Knee Extension  4/5  4/5  Ankle Dorsiflexion      Ankle Plantarflexion      Ankle  Inversion      Ankle Eversion       (Blank rows = not tested)  * pain    TREATMENT:  DATE:  04/30/23 See HEP - demonstrated with trial reps performed for comprehension     PATIENT EDUCATION:  Education details: HEP Person educated: Patient Education method: Explanation, Demonstration, and Handouts Education comprehension: verbalized understanding, returned demonstration, and needs further education  HOME EXERCISE PROGRAM: Access Code: [email or web address] URL: [email or web address] Date: 04/30/2023 Prepared by: Moshe Cipro  Exercises - Standing Paraspinals Mobilization with Small Ball on Wall  - 3 x daily - 7 x weekly - 1 sets - 1 reps - 2-3 min hold - Standing Scapular Retraction  - 1 x daily - 7 x weekly - 1 sets - 10 reps - 5 sec hold - Standing Backward Shoulder Rolls  - 1 x daily - 7 x weekly - 1 sets - 10 reps - Seated Knee Extension AROM  - 1 x daily - 7 x weekly - 1-2 sets - 10 reps - Seated Straight Leg Raise  - 1 x daily - 7 x weekly - 1-2 sets - 10 reps - Sit to Stand  - 1 x daily - 7 x weekly - 1-2 sets - 10 reps   ASSESSMENT:  CLINICAL IMPRESSION: Patient is a 72 y.o. female who was seen today for physical therapy evaluation and treatment for back pain. She demonstrates decreased strength and ROM as well as continued pain affecting mobility.  She will benefit from PT to address deficits listed.      OBJECTIVE IMPAIRMENTS: Abnormal gait, decreased balance, decreased mobility, difficulty walking, decreased ROM, decreased strength, increased fascial restrictions, increased muscle spasms, postural dysfunction, and pain.   ACTIVITY LIMITATIONS: carrying, lifting, bending, sitting, standing, squatting, sleeping, stairs, transfers, and locomotion level  PARTICIPATION LIMITATIONS: meal prep, cleaning, laundry, driving, shopping, and  community activity  PERSONAL FACTORS: 3+ comorbidities: COPD, PTSD,  Rt THA 8/ 2020. Left THA 03/2019, Bipolar disorder, anxiety  are also affecting patient's functional outcome.   REHAB POTENTIAL: Good  CLINICAL DECISION MAKING: Evolving/moderate complexity  EVALUATION COMPLEXITY: Moderate   GOALS: Goals reviewed with patient? Yes  SHORT TERM GOALS: Target date: 05/21/2023  Independent with initial HEP Goal status: INITIAL  LONG TERM GOALS: Target date: 06/11/2023  Independent with final HEP Goal status: INITIAL  2.  FOTO score improved to 44 Goal status: INITIAL  3.  Bil UE shoulder strength improved to 4/5 for improved function Goal status: INIITAL  4.  Report pain < 2/10 in upper back with reaching and overhead activities for improved function Goal status: INITIAL      PLAN:  PT FREQUENCY: 1-2x/week  PT DURATION: 6 weeks  PLANNED INTERVENTIONS: Therapeutic exercises, Therapeutic activity, Neuromuscular re-education, Balance training, Gait training, Patient/Family education, Self Care, Joint mobilization, Aquatic Therapy, Dry Needling, Electrical stimulation, Spinal manipulation, Spinal mobilization, Cryotherapy, Moist heat, Taping, Traction, Manual therapy, and Re-evaluation.  PLAN FOR NEXT SESSION: review HEP and progress postural and strengthening exercises, she's unable to spend a lot of time supine or prone so most exercises need to be sitting/standing  NEXT MD VISIT: PRN   Clarita Crane, PT, DPT 04/30/23 1:41 PM

## 2023-05-02 ENCOUNTER — Telehealth: Payer: Self-pay | Admitting: Physical Medicine and Rehabilitation

## 2023-05-02 NOTE — Telephone Encounter (Signed)
Patient returned call asked for a call back. The number to contact patient is [phone]

## 2023-05-06 ENCOUNTER — Ambulatory Visit (INDEPENDENT_AMBULATORY_CARE_PROVIDER_SITE_OTHER): Payer: Medicare Other | Admitting: Physical Therapy

## 2023-05-06 ENCOUNTER — Encounter: Payer: Self-pay | Admitting: Physical Therapy

## 2023-05-06 DIAGNOSIS — M542 Cervicalgia: Secondary | ICD-10-CM

## 2023-05-06 DIAGNOSIS — M6281 Muscle weakness (generalized): Secondary | ICD-10-CM

## 2023-05-06 DIAGNOSIS — R262 Difficulty in walking, not elsewhere classified: Secondary | ICD-10-CM

## 2023-05-06 DIAGNOSIS — G8929 Other chronic pain: Secondary | ICD-10-CM

## 2023-05-06 DIAGNOSIS — R293 Abnormal posture: Secondary | ICD-10-CM

## 2023-05-06 DIAGNOSIS — M25562 Pain in left knee: Secondary | ICD-10-CM

## 2023-05-06 DIAGNOSIS — M546 Pain in thoracic spine: Secondary | ICD-10-CM | POA: Diagnosis not present

## 2023-05-06 NOTE — Telephone Encounter (Signed)
Patient is scheduled for injection 05/13/23

## 2023-05-06 NOTE — Therapy (Signed)
OUTPATIENT PHYSICAL THERAPY TREATMENT   Patient Name: Judy [name] MRN: [id number] DOB:1951/09/17, 72 y.o., female Today's Date: 05/06/2023  END OF SESSION:  PT End of Session - 05/06/23 0928     Visit Number 2    Number of Visits 12    Date for PT Re-Evaluation 06/11/23    Authorization Type Medicare/[employer]    Progress Note Due on Visit 10    PT Start Time 0928    PT Stop Time 1013    PT Time Calculation (min) 45 min    Activity Tolerance Patient tolerated treatment well;Patient limited by fatigue    Behavior During Therapy [facility] for tasks assessed/performed             Past Medical History:  Diagnosis Date   Anxiety    Arthritis    Avascular necrosis (HCC)    Left hip   Avascular necrosis of bone of hip, 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   Bipolar 1 disorder (HCC)    COPD (chronic obstructive pulmonary disease) (HCC)    Depression    Dyspnea    Pneumonia    PTSD (post-traumatic stress disorder)    Past Surgical History:  Procedure Laterality Date   bone spurs foot Right    CATARACT EXTRACTION Bilateral    COLONOSCOPY     COLONOSCOPY  07/2022   HAND SURGERY Left    JOINT REPLACEMENT     TONSILLECTOMY     TONSILLECTOMY AND ADENOIDECTOMY     TOTAL HIP ARTHROPLASTY Left 03/19/2019   Procedure: LEFT TOTAL HIP ARTHROPLASTY ANTERIOR APPROACH;  Surgeon: [name], MD;  Location: [facility];  Service: Orthopedics;  Laterality: Left;   TOTAL HIP ARTHROPLASTY Right 06/04/2019   Procedure: RIGHT TOTAL HIP ARTHROPLASTY ANTERIOR APPROACH;  Surgeon: [name], MD;  Location: [facility];  Service: Orthopedics;  Laterality: Right;   TOTAL KNEE ARTHROPLASTY Left 12/28/2021   Procedure: Left TOTAL KNEE ARTHROPLASTY;  Surgeon: [name], MD;  Location: [facility];  Service: Orthopedics;  Laterality: Left;   Patient Active Problem List   Diagnosis Date Noted   Prediabetes 08/09/2022    Restless legs syndrome 08/09/2022   Status post total left knee replacement 12/28/2021   Primary osteoarthritis of left knee 10/25/2021   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) 11/30/2020   Status post total replacement of right hip 06/04/2019   Status post total replacement of left hip 03/19/2019   PTSD (post-traumatic stress disorder)    COPD (chronic obstructive pulmonary disease) (HCC)    Bipolar 1 disorder (HCC)    Anxiety    B12 deficiency 12/09/2018   Hyperglycemia 04/14/2017    PCP: Karie Georges, MD  REFERRING PROVIDER: Juanda Chance, NP  REFERRING DIAG: [phone] (ICD-10-CM) - Myofascial pain syndrome  Rationale for Evaluation and Treatment: Rehabilitation  THERAPY DIAG:  Pain in thoracic spine  Muscle weakness (generalized)  Chronic pain of left knee  Difficulty in walking, not elsewhere classified  Abnormal posture  Cervicalgia  ONSET DATE: 2 weeks ago   SUBJECTIVE:  SUBJECTIVE STATEMENT: Pt reports her leg feels good after nerve block, she complains of neck pain and stiffness and back pain in the center. She wants to be able to walk better by December for a cruise she is going on.   PERTINENT HISTORY:  COPD, PTSD, Rt THA 8/ 2020. Left THA 03/2019, Bipolar disorder, anxiety  PAIN:  Are you having pain? Yes: NPRS scale: 2 currently, up to 10 in upper back; 2 currently in knee/10 Pain location: upper back and Lt knee Pain description: stabbing; weakness, hot knife and twisting in back Aggravating factors: walking Relieving factors: lying down  PRECAUTIONS:  None  RED FLAGS: None   WEIGHT BEARING RESTRICTIONS:  No  FALLS:  Has patient fallen in last 6 months? No  LIVING ENVIRONMENT: Lives with: lives alone Lives in:  House/apartment Stairs: Yes: External: 4 steps; can reach both  OCCUPATION:  retired  PLOF:  Independent and Leisure: watch TV/election results  PATIENT GOALS:  Feel better before heading to beach in 2 week   OBJECTIVE:   DIAGNOSTIC FINDINGS:  Lumbar MRI imaging from January of this year exhibits mild to moderate spinal canal stenosis and bilateral lateral recess stenosis at L3-L4, there is also severe facet arthropathy and medially oriented facet synovial cyst at this level  PATIENT SURVEYS:  04/30/23: FOTO 33 (predicted 44)  SENSATION: WFL  POSTURE:  rounded shoulders and forward head  GAIT: 04/30/23:  Distance walked: 100' Assistive device utilized: None Level of assistance: Modified independence Comments: antalgic gait   BACK  PALPATION: 04/30/23: very tender to palpation along Lt rhomboids   UPPER EXTREMITY MMT:  MMT Right eval Left eval  Shoulder flexion 3/5 3/5 with pain  Shoulder extension    Shoulder abduction 3/5 3/5 with pain  Shoulder adduction    Shoulder extension    Shoulder internal rotation    Shoulder external rotation     (Blank rows = not tested)   LUMBAR ROM:   ROM A/PROM  eval  Flexion Limited  50%  Extension WNL  Right Quadrant Limited 25% with pain  Left Quadrant Limited  25%   (Blank rows = not tested)  Neck ROM:   ROM AROM  05/06/23  Flexion 15 deg  Extension 20 deg  Left rotation 15 deg  Right rotation 25 deg     Lower Extremity Right 05/06/2023 Left 05/06/2023   A/PROM MMT A/PROM MMT  Hip Flexion  3/5  3/5  Hip Extension      Hip Abduction      Hip Adduction      Hip Internal rotation      Hip External rotation      Knee Flexion  4/5  4/5  Knee Extension  4/5  4/5  Ankle Dorsiflexion      Ankle Plantarflexion      Ankle Inversion      Ankle Eversion       (Blank rows = not tested)  * pain    TREATMENT:  DATE:  05/06/23 Nu step L5 X 6 min UE/LE Standing rows red 2X10 bilat Standing shoulder extension red 2X10 bilat Seated lumbar flexion stretch with red ball 5 sec X 10 Seated ab set with pball isometric shoulder ext 5 sec X 10 and isometric hip flexion 5 sec X 5 bilat Seated cervical AAROM with towel 5 sec X 10 bilat Seated cervical isometrics side bending, flexion, extension all 5 sec X 10 reps each Seated SLR 2X10 bilat Seated chin tucks X10 HEP review.     04/30/23 See HEP - demonstrated with trial reps performed for comprehension     PATIENT EDUCATION:  Education details: HEP Person educated: Patient Education method: Explanation, Demonstration, and Handouts Education comprehension: verbalized understanding, returned demonstration, and needs further education  HOME EXERCISE PROGRAM: Access Code: [email or web address] URL: [email or web address] Date: 05/06/2023 Prepared by: Ivery Quale  Exercises - Standing Paraspinals Mobilization with Small Ball on Wall  - 3 x daily - 7 x weekly - 1 sets - 1 reps - 2-3 min hold - Standing Scapular Retraction  - 1 x daily - 7 x weekly - 1 sets - 10 reps - 5 sec hold - Standing Backward Shoulder Rolls  - 1 x daily - 7 x weekly - 1 sets - 10 reps - Seated Knee Extension AROM  - 1 x daily - 7 x weekly - 1-2 sets - 10 reps - Seated Straight Leg Raise  - 1 x daily - 7 x weekly - 1-2 sets - 10 reps - Sit to Stand  - 1 x daily - 7 x weekly - 1-2 sets - 10 reps - Seated Assisted Cervical Rotation with Towel  - 2 x daily - 6 x weekly - 1 sets - 10 reps - 5 hold - Seated Isometric Cervical Sidebending  - 2 x daily - 6 x weekly - 1 sets - 10 reps - 5 hold - Seated Isometric Cervical Extension  - 2 x daily - 6 x weekly - 1 sets - 10 reps - 5 hold - Seated Isometric Cervical Flexion  - 2 x daily - 6 x weekly - 1 sets - 10 reps - 6 hold - Seated Passive Cervical Retraction  - 2 x daily - 6 x weekly - 1 sets - 10  reps   ASSESSMENT:  CLINICAL IMPRESSION: She wants to have PT for her neck also. She does have severely decreased neck ROM that is limiting her ability to turn her head to scan her environment. I did send referring provider a message in mychart who did authorize PT for her neck. I then added new cervicalgia diagnosis and will send off new PT certification to now add neck into her PT plan of care. I provided her with additional neck exercises for ROM and strength to add into her current HEP.  I did write new PT goal for neck as well.     OBJECTIVE IMPAIRMENTS: Abnormal gait, decreased balance, decreased mobility, difficulty walking, decreased ROM, decreased strength, increased fascial restrictions, increased muscle spasms, postural dysfunction, and pain.   ACTIVITY LIMITATIONS: carrying, lifting, bending, sitting, standing, squatting, sleeping, stairs, transfers, and locomotion level  PARTICIPATION LIMITATIONS: meal prep, cleaning, laundry, driving, shopping, and community activity  PERSONAL FACTORS: 3+ comorbidities: COPD, PTSD,  Rt THA 8/ 2020. Left THA 03/2019, Bipolar disorder, anxiety  are also affecting patient's functional outcome.   REHAB POTENTIAL: Good  CLINICAL DECISION MAKING: Evolving/moderate complexity  EVALUATION COMPLEXITY: Moderate   GOALS: Goals reviewed with patient? Yes  SHORT TERM GOALS: Target date: 05/21/2023  Independent with initial HEP Goal status: INITIAL  LONG TERM GOALS: Target date: 06/11/2023  Independent with final HEP Goal status: INITIAL  2.  FOTO score improved to 44 Goal status: INITIAL  3.  Bil UE shoulder strength improved to 4/5 for improved function Goal status: INIITAL  4.  Report pain < 2/10 in upper back with reaching and overhead activities for improved function Goal status: INITIAL  5. Pt will improve neck rotation ROM >35 degrees each side for improved scanning of her enviornment      PLAN:  PT FREQUENCY: 1-2x/week  PT  DURATION: 6 weeks  PLANNED INTERVENTIONS: Therapeutic exercises, Therapeutic activity, Neuromuscular re-education, Balance training, Gait training, Patient/Family education, Self Care, Joint mobilization, Aquatic Therapy, Dry Needling, Electrical stimulation, Spinal manipulation, Spinal mobilization, Cryotherapy, Moist heat, Taping, Traction, Manual therapy, and Re-evaluation.  PLAN FOR NEXT SESSION: neck and back exercises, progress postural and strengthening exercises, she's unable to spend a lot of time supine or prone so most exercises need to be sitting/standing  NEXT MD VISIT: PRN  Ivery Quale, PT, DPT 05/06/23 10:13 AM

## 2023-05-08 NOTE — Therapy (Signed)
OUTPATIENT PHYSICAL THERAPY TREATMENT   Patient Name: Judy [name] MRN: [id number] DOB:1951-04-09, 72 y.o., female Today's Date: 05/09/2023  END OF SESSION:  PT End of Session - 05/09/23 1345     Visit Number 3    Number of Visits 12    Date for PT Re-Evaluation 06/11/23    Authorization Type Medicare/[employer]    Progress Note Due on Visit 10    PT Start Time 1346    PT Stop Time 1427    PT Time Calculation (min) 41 min    Activity Tolerance Patient tolerated treatment well;No increased pain    Behavior During Therapy WFL for tasks assessed/performed              Past Medical History:  Diagnosis Date   Anxiety    Arthritis    Avascular necrosis (HCC)    Left hip   Avascular necrosis of bone of hip, 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   Bipolar 1 disorder (HCC)    COPD (chronic obstructive pulmonary disease) (HCC)    Depression    Dyspnea    Pneumonia    PTSD (post-traumatic stress disorder)    Past Surgical History:  Procedure Laterality Date   bone spurs foot Right    CATARACT EXTRACTION Bilateral    COLONOSCOPY     COLONOSCOPY  07/2022   HAND SURGERY Left    JOINT REPLACEMENT     TONSILLECTOMY     TONSILLECTOMY AND ADENOIDECTOMY     TOTAL HIP ARTHROPLASTY Left 03/19/2019   Procedure: LEFT TOTAL HIP ARTHROPLASTY ANTERIOR APPROACH;  Surgeon: [name], MD;  Location: [facility];  Service: Orthopedics;  Laterality: Left;   TOTAL HIP ARTHROPLASTY Right 06/04/2019   Procedure: RIGHT TOTAL HIP ARTHROPLASTY ANTERIOR APPROACH;  Surgeon: [name], MD;  Location: [facility];  Service: Orthopedics;  Laterality: Right;   TOTAL KNEE ARTHROPLASTY Left 12/28/2021   Procedure: Left TOTAL KNEE ARTHROPLASTY;  Surgeon: [name], MD;  Location: [facility];  Service: Orthopedics;  Laterality: Left;   Patient Active Problem List   Diagnosis Date Noted   Prediabetes 08/09/2022    Restless legs syndrome 08/09/2022   Status post total left knee replacement 12/28/2021   Primary osteoarthritis of left knee 10/25/2021   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) 11/30/2020   Status post total replacement of right hip 06/04/2019   Status post total replacement of left hip 03/19/2019   PTSD (post-traumatic stress disorder)    COPD (chronic obstructive pulmonary disease) (HCC)    Bipolar 1 disorder (HCC)    Anxiety    B12 deficiency 12/09/2018   Hyperglycemia 04/14/2017    PCP: Karie Georges, MD  REFERRING PROVIDER: Juanda Chance, NP  REFERRING DIAG: [phone] (ICD-10-CM) - Myofascial pain syndrome  Rationale for Evaluation and Treatment: Rehabilitation  THERAPY DIAG:  Pain in thoracic spine  Muscle weakness (generalized)  ONSET DATE: 2 weeks ago   SUBJECTIVE:  SUBJECTIVE STATEMENT: Pt states that the towel exercises were very helpful last session, gave her a lot of relief. Doing well with HEP other than self massage with tennis ball which she states is irritating, has discontinued. No other new updates  PERTINENT HISTORY:  COPD, PTSD, Rt THA 8/ 2020. Left THA 03/2019, Bipolar disorder, anxiety  PAIN:  Are you having pain? Yes: NPRS scale: 2 currently, up to 10 in upper back; 2 currently in knee/10 Pain location: upper back and Lt knee Pain description: stabbing; weakness, hot knife and twisting in back Aggravating factors: walking Relieving factors: lying down  PRECAUTIONS:  None  RED FLAGS: None   WEIGHT BEARING RESTRICTIONS:  No  FALLS:  Has patient fallen in last 6 months? 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 and bilateral lateral recess stenosis at L3-L4, there is also severe facet arthropathy and medially oriented facet synovial cyst at this level  PATIENT SURVEYS:  04/30/23: FOTO 33 (predicted 44)  SENSATION: WFL  POSTURE:  rounded shoulders and forward head  GAIT: 04/30/23:  Distance walked: 100' Assistive device utilized: None Level of assistance: Modified independence Comments: antalgic gait   BACK  PALPATION: 04/30/23: very tender to palpation along Lt rhomboids   UPPER EXTREMITY MMT:  MMT Right eval Left eval  Shoulder flexion 3/5 3/5 with pain  Shoulder extension    Shoulder abduction 3/5 3/5 with pain  Shoulder adduction    Shoulder extension    Shoulder internal rotation    Shoulder external rotation     (Blank rows = not tested)   LUMBAR ROM:   ROM A/PROM  eval  Flexion Limited  50%  Extension WNL  Right Quadrant Limited 25% with pain  Left Quadrant Limited  25%   (Blank rows = not tested)  Neck ROM:   ROM AROM  05/06/23  Flexion 15 deg  Extension 20 deg  Left rotation 15 deg  Right rotation 25 deg     Lower Extremity Right 05/09/2023 Left 05/09/2023   A/PROM MMT A/PROM MMT  Hip Flexion  3/5  3/5  Hip Extension      Hip Abduction      Hip Adduction      Hip Internal rotation      Hip External rotation      Knee Flexion  4/5  4/5  Knee Extension  4/5  4/5  Ankle Dorsiflexion      Ankle Plantarflexion      Ankle Inversion      Ankle Eversion       (Blank rows = not tested)  * pain    TREATMENT:  [facility] Adult PT Treatment:                                                DATE: 05/09/23 Therapeutic Exercise: Nu step L5 UE/LE 5 min during subjective Cervical sidebending isos 2x5 BIL Seated swiss ball flexion isometric 2x10 cues for breath control, avoiding valsalva Red band unilat shoulder ext x10 BIL Green band row 2x10 cues for form and posture  Green band BIL shoulder ext x10  Seated cervical rotation assisted by towel x6 BIL Seated chin tuck x8 cues for comfortable ROM     DATE:  05/06/23 Nu step L5 X 6 min UE/LE Standing rows red 2X10 bilat Standing shoulder extension red 2X10 bilat Seated lumbar flexion stretch with red ball 5 sec X 10 Seated ab set with pball isometric shoulder ext 5 sec X 10 and isometric hip flexion 5 sec X 5 bilat Seated cervical AAROM with towel 5 sec X 10 bilat Seated cervical isometrics side bending, flexion, extension all 5 sec X 10 reps each Seated SLR 2X10 bilat Seated chin tucks X10 HEP review.     04/30/23 See HEP - demonstrated with trial reps performed for comprehension     PATIENT EDUCATION:  Education details: HEP Person educated: Patient Education method: Explanation, Demonstration, and Handouts Education comprehension: verbalized understanding, returned demonstration, and needs further education  HOME EXERCISE PROGRAM: Access Code: [email or web address] URL: [email or web address] Date: 05/06/2023 Prepared by: Ivery Quale  Exercises - Standing Paraspinals Mobilization with Small Ball on Wall  - 3 x daily - 7 x weekly - 1 sets - 1 reps - 2-3 min hold - Standing Scapular Retraction  - 1 x daily - 7 x weekly - 1 sets - 10 reps - 5 sec hold - Standing Backward Shoulder Rolls  - 1 x daily - 7 x weekly - 1 sets - 10 reps - Seated Knee Extension AROM  - 1 x daily - 7 x weekly - 1-2 sets - 10 reps - Seated Straight Leg Raise  - 1 x daily - 7 x weekly - 1-2 sets - 10 reps - Sit to Stand  - 1 x daily - 7 x weekly - 1-2 sets - 10  reps - Seated Assisted Cervical Rotation with Towel  - 2 x daily - 6 x weekly - 1 sets - 10 reps - 5 hold - Seated Isometric Cervical Sidebending  - 2 x daily - 6 x weekly - 1 sets - 10 reps - 5 hold - Seated Isometric Cervical Extension  - 2 x daily - 6 x weekly - 1 sets - 10 reps - 5 hold - Seated Isometric Cervical Flexion  - 2 x daily - 6 x weekly - 1 sets - 10 reps - 6 hold - Seated Passive Cervical Retraction  - 2 x daily - 6 x weekly - 1 sets - 10 reps   ASSESSMENT:  CLINICAL IMPRESSION: Pt arrives w/ report of 1/10 pain, noted good improvement with addition of cervical exercises last session. Continuing to progress periscapular/core work today with good tolerance, cues as above. Most limitation noted with low back fatigue in standing which is improved with seated rest breaks.  No adverse events, reports no overt change in symptoms on departure. Recommend continuing along current POC in order to address relevant deficits and improve functional tolerance. Pt departs today's session in  no acute distress, all voiced questions/concerns addressed appropriately from PT perspective.       OBJECTIVE IMPAIRMENTS: Abnormal gait, decreased balance, decreased mobility, difficulty walking, decreased ROM, decreased strength, increased fascial restrictions, increased muscle spasms, postural dysfunction, and pain.   ACTIVITY LIMITATIONS: carrying, lifting, bending, sitting, standing, squatting, sleeping, stairs, transfers, and locomotion level  PARTICIPATION LIMITATIONS: meal prep, cleaning, laundry, driving, shopping, and community activity  PERSONAL FACTORS: 3+ comorbidities: COPD, PTSD,  Rt THA 8/ 2020. Left THA 03/2019, Bipolar disorder, anxiety  are also affecting patient's functional outcome.   REHAB POTENTIAL: Good  CLINICAL DECISION MAKING: Evolving/moderate complexity  EVALUATION COMPLEXITY: Moderate   GOALS: Goals reviewed with patient? Yes  SHORT TERM GOALS: Target date:  05/21/2023  Independent with initial HEP Goal status: INITIAL  LONG TERM GOALS: Target date: 06/11/2023  Independent with final HEP Goal status: INITIAL  2.  FOTO score improved to 44 Goal status: INITIAL  3.  Bil UE shoulder strength improved to 4/5 for improved function Goal status: INIITAL  4.  Report pain < 2/10 in upper back with reaching and overhead activities for improved function Goal status: INITIAL  5. Pt will improve neck rotation ROM >35 degrees each side for improved scanning of her enviornment      PLAN:  PT FREQUENCY: 1-2x/week  PT DURATION: 6 weeks  PLANNED INTERVENTIONS: Therapeutic exercises, Therapeutic activity, Neuromuscular re-education, Balance training, Gait training, Patient/Family education, Self Care, Joint mobilization, Aquatic Therapy, Dry Needling, Electrical stimulation, Spinal manipulation, Spinal mobilization, Cryotherapy, Moist heat, Taping, Traction, Manual therapy, and Re-evaluation.  PLAN FOR NEXT SESSION: neck and back exercises, progress postural and strengthening exercises, she's unable to spend a lot of time supine or prone so most exercises need to be sitting/standing    NEXT MD VISIT: PRN  Ashley Murrain PT, DPT 05/09/2023 2:29 PM

## 2023-05-09 ENCOUNTER — Ambulatory Visit (INDEPENDENT_AMBULATORY_CARE_PROVIDER_SITE_OTHER): Payer: Medicare Other | Admitting: Physical Therapy

## 2023-05-09 ENCOUNTER — Encounter: Payer: Self-pay | Admitting: Physical Therapy

## 2023-05-09 DIAGNOSIS — M6281 Muscle weakness (generalized): Secondary | ICD-10-CM | POA: Diagnosis not present

## 2023-05-09 DIAGNOSIS — M546 Pain in thoracic spine: Secondary | ICD-10-CM | POA: Diagnosis not present

## 2023-05-13 ENCOUNTER — Other Ambulatory Visit: Payer: Self-pay

## 2023-05-13 ENCOUNTER — Ambulatory Visit: Payer: Medicare Other | Admitting: Physical Medicine and Rehabilitation

## 2023-05-13 ENCOUNTER — Encounter: Payer: Self-pay | Admitting: Physical Therapy

## 2023-05-13 ENCOUNTER — Ambulatory Visit (INDEPENDENT_AMBULATORY_CARE_PROVIDER_SITE_OTHER): Payer: Medicare Other | Admitting: Physical Therapy

## 2023-05-13 VITALS — BP 151/70 | HR 74

## 2023-05-13 DIAGNOSIS — M546 Pain in thoracic spine: Secondary | ICD-10-CM | POA: Diagnosis not present

## 2023-05-13 DIAGNOSIS — G8929 Other chronic pain: Secondary | ICD-10-CM

## 2023-05-13 DIAGNOSIS — M6281 Muscle weakness (generalized): Secondary | ICD-10-CM | POA: Diagnosis not present

## 2023-05-13 DIAGNOSIS — M25562 Pain in left knee: Secondary | ICD-10-CM

## 2023-05-13 DIAGNOSIS — M5416 Radiculopathy, lumbar region: Secondary | ICD-10-CM | POA: Diagnosis not present

## 2023-05-13 DIAGNOSIS — R293 Abnormal posture: Secondary | ICD-10-CM

## 2023-05-13 DIAGNOSIS — R262 Difficulty in walking, not elsewhere classified: Secondary | ICD-10-CM

## 2023-05-13 MED ORDER — METHYLPREDNISOLONE ACETATE 80 MG/ML IJ SUSP: 80.0000 mg | Freq: Once | INTRAMUSCULAR | Status: DC

## 2023-05-13 NOTE — Progress Notes (Signed)
Functional Pain Scale - descriptive words and definitions  Uncomfortable (3)  Pain is present but can complete all ADL's/sleep is slightly affected and passive distraction only gives marginal relief. Mild range order  Average Pain  varies   +Driver, -BT, -Dye Allergies.  Lower back pain that alternates between sides with radiation in the right leg

## 2023-05-13 NOTE — Therapy (Signed)
OUTPATIENT PHYSICAL THERAPY TREATMENT   Patient Name: Judy [name] MRN: [id number] DOB:1951-05-31, 72 y.o., female Today's Date: 05/13/2023  END OF SESSION:  PT End of Session - 05/13/23 1350     Visit Number 4    Number of Visits 12    Date for PT Re-Evaluation 06/11/23    Authorization Type Medicare/[employer]    Progress Note Due on Visit 10    PT Start Time 1300    PT Stop Time 1345    PT Time Calculation (min) 45 min    Activity Tolerance Patient tolerated treatment well;No increased pain    Behavior During Therapy WFL for tasks assessed/performed               Past Medical History:  Diagnosis Date   Anxiety    Arthritis    Avascular necrosis (HCC)    Left hip   Avascular necrosis of bone of hip, 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   Bipolar 1 disorder (HCC)    COPD (chronic obstructive pulmonary disease) (HCC)    Depression    Dyspnea    Pneumonia    PTSD (post-traumatic stress disorder)    Past Surgical History:  Procedure Laterality Date   bone spurs foot Right    CATARACT EXTRACTION Bilateral    COLONOSCOPY     COLONOSCOPY  07/2022   HAND SURGERY Left    JOINT REPLACEMENT     TONSILLECTOMY     TONSILLECTOMY AND ADENOIDECTOMY     TOTAL HIP ARTHROPLASTY Left 03/19/2019   Procedure: LEFT TOTAL HIP ARTHROPLASTY ANTERIOR APPROACH;  Surgeon: [name], MD;  Location: [facility];  Service: Orthopedics;  Laterality: Left;   TOTAL HIP ARTHROPLASTY Right 06/04/2019   Procedure: RIGHT TOTAL HIP ARTHROPLASTY ANTERIOR APPROACH;  Surgeon: [name], MD;  Location: [facility];  Service: Orthopedics;  Laterality: Right;   TOTAL KNEE ARTHROPLASTY Left 12/28/2021   Procedure: Left TOTAL KNEE ARTHROPLASTY;  Surgeon: [name], MD;  Location: [facility];  Service: Orthopedics;  Laterality: Left;   Patient Active Problem List   Diagnosis Date Noted   Prediabetes 08/09/2022    Restless legs syndrome 08/09/2022   Status post total left knee replacement 12/28/2021   Primary osteoarthritis of left knee 10/25/2021   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) 11/30/2020   Status post total replacement of right hip 06/04/2019   Status post total replacement of left hip 03/19/2019   PTSD (post-traumatic stress disorder)    COPD (chronic obstructive pulmonary disease) (HCC)    Bipolar 1 disorder (HCC)    Anxiety    B12 deficiency 12/09/2018   Hyperglycemia 04/14/2017    PCP: Karie Georges, MD  REFERRING PROVIDER: Juanda Chance, NP  REFERRING DIAG: [phone] (ICD-10-CM) - Myofascial pain syndrome  Rationale for Evaluation and Treatment: Rehabilitation  THERAPY DIAG:  Pain in thoracic spine  Muscle weakness (generalized)  Chronic pain of left knee  Abnormal posture  Difficulty in walking, not elsewhere classified  ONSET DATE: 2 weeks ago   SUBJECTIVE:  SUBJECTIVE STATEMENT: Pt reporting annoying pain of 1/10. Pt stating the neck stretches are helping.   PERTINENT HISTORY:  COPD, PTSD, Rt THA 8/ 2020. Left THA 03/2019, Bipolar disorder, anxiety  PAIN:  Are you having pain? Yes: NPRS scale: 1/10 Pain location: upper back and Lt knee Pain description: stabbing; weakness, hot knife and twisting in back Aggravating factors: walking Relieving factors: lying down  PRECAUTIONS:  None  RED FLAGS: None   WEIGHT BEARING RESTRICTIONS:  No  FALLS:  Has patient fallen in last 6 months? No  LIVING ENVIRONMENT: Lives with: lives alone Lives in: House/apartment Stairs: Yes: External: 4 steps; can reach both  OCCUPATION:  retired  PLOF:  Independent and Leisure: watch TV/election results  PATIENT GOALS:  Feel better before heading to beach in  2 week   OBJECTIVE: (objective measures completed at initial evaluation unless otherwise dated)   DIAGNOSTIC FINDINGS:  Lumbar MRI imaging from January of this year exhibits mild to moderate spinal canal stenosis and bilateral lateral recess stenosis at L3-L4, there is also severe facet arthropathy and medially oriented facet synovial cyst at this level  PATIENT SURVEYS:  04/30/23: FOTO 33 (predicted 44)  SENSATION: WFL  POSTURE:  rounded shoulders and forward head  GAIT: 04/30/23:  Distance walked: 100' Assistive device utilized: None Level of assistance: Modified independence Comments: antalgic gait   BACK  PALPATION: 04/30/23: very tender to palpation along Lt rhomboids   UPPER EXTREMITY MMT:  MMT Right eval Left eval  Shoulder flexion 3/5 3/5 with pain  Shoulder extension    Shoulder abduction 3/5 3/5 with pain  Shoulder adduction    Shoulder extension    Shoulder internal rotation    Shoulder external rotation     (Blank rows = not tested)   LUMBAR ROM:   ROM A/PROM  eval  Flexion Limited  50%  Extension WNL  Right Quadrant Limited 25% with pain  Left Quadrant Limited  25%   (Blank rows = not tested)  Neck ROM:   ROM AROM  05/06/23 AROM 05/13/23  Flexion 15 deg 18  Extension 20 deg 20  Left rotation 15 deg 30  Right rotation 25 deg 35     Lower Extremity Right 05/13/2023 Left 05/13/2023   A/PROM MMT A/PROM MMT  Hip Flexion  3/5  3/5  Hip Extension      Hip Abduction      Hip Adduction      Hip Internal rotation      Hip External rotation      Knee Flexion  4/5  4/5  Knee Extension  4/5  4/5  Ankle Dorsiflexion      Ankle Plantarflexion      Ankle Inversion      Ankle Eversion       (Blank rows = not tested)  * pain  ___________________________________________________________________________________  TODAY's TREATMENT:  ___________________________________                                              DATE: 05/13/23 TherEx: Pulleys x 2 minutes flexion Rows: green TB 2 x 15  Shoulder extension: red TB 2 x 15  Cervical rotation using towel roll for added stretch x 5 to each side holding 10 sec Chin tuck trying to squeeze a towel roll placed under her chin. X 10 holding 3 sec Cervical side-bending; x 5 holding 10 sec Seated cervical isometrics with ball behind pt's head x 10 holding 5 sec Modalities:  7 minutes of moist heat cervical spine and mid/low back         TREATMENT:                                                                                                                              [facility] Adult PT Treatment:                                                DATE: 05/09/23 Therapeutic Exercise: Nu step L5 UE/LE 5 min during subjective Cervical sidebending isos 2x5 BIL Seated swiss ball flexion isometric 2x10 cues for breath control, avoiding valsalva Red band unilat shoulder ext x10 BIL Green band row 2x10 cues for form and posture  Green band BIL shoulder ext x10  Seated cervical rotation assisted by towel x6 BIL Seated chin tuck x8 cues for comfortable ROM     DATE:  05/06/23 Nu step L5 X 6 min UE/LE Standing rows red 2X10 bilat Standing shoulder extension red 2X10 bilat Seated lumbar flexion stretch with red ball 5 sec X 10 Seated ab set with pball isometric shoulder ext 5 sec X 10 and isometric hip flexion 5 sec X 5 bilat Seated cervical AAROM with towel 5 sec X 10 bilat Seated cervical isometrics side bending, flexion, extension all 5 sec X 10 reps each Seated SLR 2X10 bilat Seated chin tucks X10 HEP review.      PATIENT EDUCATION:  Education details: HEP Person educated: Patient Education method: Programmer, multimedia, Facilities manager, and Handouts Education comprehension: verbalized understanding, returned demonstration, and needs further education  HOME EXERCISE  PROGRAM: Access Code: [email or web address] URL: [email or web address] Date: 05/06/2023 Prepared by: Ivery Quale  Exercises - Standing Paraspinals Mobilization with Small Ball on Wall  - 3 x daily - 7 x weekly - 1 sets - 1 reps - 2-3 min hold - Standing Scapular Retraction  - 1 x daily - 7 x weekly - 1 sets - 10 reps - 5 sec hold - Standing Backward Shoulder Rolls  - 1 x daily - 7 x weekly - 1 sets - 10 reps - Seated  Knee Extension AROM  - 1 x daily - 7 x weekly - 1-2 sets - 10 reps - Seated Straight Leg Raise  - 1 x daily - 7 x weekly - 1-2 sets - 10 reps - Sit to Stand  - 1 x daily - 7 x weekly - 1-2 sets - 10 reps - Seated Assisted Cervical Rotation with Towel  - 2 x daily - 6 x weekly - 1 sets - 10 reps - 5 hold - Seated Isometric Cervical Sidebending  - 2 x daily - 6 x weekly - 1 sets - 10 reps - 5 hold - Seated Isometric Cervical Extension  - 2 x daily - 6 x weekly - 1 sets - 10 reps - 5 hold - Seated Isometric Cervical Flexion  - 2 x daily - 6 x weekly - 1 sets - 10 reps - 6 hold - Seated Passive Cervical Retraction  - 2 x daily - 6 x weekly - 1 sets - 10 reps   ASSESSMENT:  CLINICAL IMPRESSION: Pt with improvements in her cervical ROM since beginning therapy. Pt reporting good response to HEP. Pt with limitations in shoulder flexion activities above 90 deg. Recommend continued skilled PT interventions     OBJECTIVE IMPAIRMENTS: Abnormal gait, decreased balance, decreased mobility, difficulty walking, decreased ROM, decreased strength, increased fascial restrictions, increased muscle spasms, postural dysfunction, and pain.   ACTIVITY LIMITATIONS: carrying, lifting, bending, sitting, standing, squatting, sleeping, stairs, transfers, and locomotion level  PARTICIPATION LIMITATIONS: meal prep, cleaning, laundry, driving, shopping, and community activity  PERSONAL FACTORS: 3+ comorbidities: COPD, PTSD,  Rt THA 8/ 2020. Left THA 03/2019, Bipolar disorder, anxiety  are also  affecting patient's functional outcome.   REHAB POTENTIAL: Good  CLINICAL DECISION MAKING: Evolving/moderate complexity  EVALUATION COMPLEXITY: Moderate   GOALS: Goals reviewed with patient? Yes  SHORT TERM GOALS: Target date: 05/21/2023  Independent with initial HEP Goal status: INITIAL  LONG TERM GOALS: Target date: 06/11/2023  Independent with final HEP Goal status: INITIAL  2.  FOTO score improved to 44 Goal status: INITIAL  3.  Bil UE shoulder strength improved to 4/5 for improved function Goal status: INIITAL  4.  Report pain < 2/10 in upper back with reaching and overhead activities for improved function Goal status: INITIAL  5. Pt will improve neck rotation ROM >35 degrees each side for improved scanning of her enviornment      PLAN:  PT FREQUENCY: 1-2x/week  PT DURATION: 6 weeks  PLANNED INTERVENTIONS: Therapeutic exercises, Therapeutic activity, Neuromuscular re-education, Balance training, Gait training, Patient/Family education, Self Care, Joint mobilization, Aquatic Therapy, Dry Needling, Electrical stimulation, Spinal manipulation, Spinal mobilization, Cryotherapy, Moist heat, Taping, Traction, Manual therapy, and Re-evaluation.  PLAN FOR NEXT SESSION: neck and back exercises, progress postural and strengthening exercises, she's unable to spend a lot of time supine or prone so most exercises need to be sitting/standing    NEXT MD VISIT: PRN  Narda Amber, PT,MPT 05/13/23 1:51 PM   05/13/2023 1:51 PM

## 2023-05-15 ENCOUNTER — Ambulatory Visit (INDEPENDENT_AMBULATORY_CARE_PROVIDER_SITE_OTHER): Payer: Medicare Other | Admitting: Physical Therapy

## 2023-05-15 ENCOUNTER — Encounter: Payer: Self-pay | Admitting: Physical Therapy

## 2023-05-15 DIAGNOSIS — M6281 Muscle weakness (generalized): Secondary | ICD-10-CM

## 2023-05-15 DIAGNOSIS — M546 Pain in thoracic spine: Secondary | ICD-10-CM | POA: Diagnosis not present

## 2023-05-15 DIAGNOSIS — M25562 Pain in left knee: Secondary | ICD-10-CM

## 2023-05-15 DIAGNOSIS — R293 Abnormal posture: Secondary | ICD-10-CM

## 2023-05-15 DIAGNOSIS — R262 Difficulty in walking, not elsewhere classified: Secondary | ICD-10-CM

## 2023-05-15 DIAGNOSIS — G8929 Other chronic pain: Secondary | ICD-10-CM

## 2023-05-15 NOTE — Therapy (Signed)
OUTPATIENT PHYSICAL THERAPY TREATMENT   Patient Name: Judy [name] MRN: [id number] DOB:1951/03/21, 72 y.o., female Today's Date: 05/15/2023  END OF SESSION:  PT End of Session - 05/15/23 1353     Visit Number 5    Number of Visits 12    Date for PT Re-Evaluation 06/11/23    Authorization Type Medicare/[employer]    Progress Note Due on Visit 10    PT Start Time 1347    PT Stop Time 1425    PT Time Calculation (min) 38 min    Activity Tolerance Patient tolerated treatment well;No increased pain    Behavior During Therapy WFL for tasks assessed/performed               Past Medical History:  Diagnosis Date   Anxiety    Arthritis    Avascular necrosis (HCC)    Left hip   Avascular necrosis of bone of hip, 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   Bipolar 1 disorder (HCC)    COPD (chronic obstructive pulmonary disease) (HCC)    Depression    Dyspnea    Pneumonia    PTSD (post-traumatic stress disorder)    Past Surgical History:  Procedure Laterality Date   bone spurs foot Right    CATARACT EXTRACTION Bilateral    COLONOSCOPY     COLONOSCOPY  07/2022   HAND SURGERY Left    JOINT REPLACEMENT     TONSILLECTOMY     TONSILLECTOMY AND ADENOIDECTOMY     TOTAL HIP ARTHROPLASTY Left 03/19/2019   Procedure: LEFT TOTAL HIP ARTHROPLASTY ANTERIOR APPROACH;  Surgeon: [name], MD;  Location: [facility];  Service: Orthopedics;  Laterality: Left;   TOTAL HIP ARTHROPLASTY Right 06/04/2019   Procedure: RIGHT TOTAL HIP ARTHROPLASTY ANTERIOR APPROACH;  Surgeon: [name], MD;  Location: [facility];  Service: Orthopedics;  Laterality: Right;   TOTAL KNEE ARTHROPLASTY Left 12/28/2021   Procedure: Left TOTAL KNEE ARTHROPLASTY;  Surgeon: [name], MD;  Location: [facility];  Service: Orthopedics;  Laterality: Left;   Patient Active Problem List   Diagnosis Date Noted   Prediabetes 08/09/2022    Restless legs syndrome 08/09/2022   Status post total left knee replacement 12/28/2021   Primary osteoarthritis of left knee 10/25/2021   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) 11/30/2020   Status post total replacement of right hip 06/04/2019   Status post total replacement of left hip 03/19/2019   PTSD (post-traumatic stress disorder)    COPD (chronic obstructive pulmonary disease) (HCC)    Bipolar 1 disorder (HCC)    Anxiety    B12 deficiency 12/09/2018   Hyperglycemia 04/14/2017    PCP: Karie Georges, MD  REFERRING PROVIDER: Juanda Chance, NP  REFERRING DIAG: [phone] (ICD-10-CM) - Myofascial pain syndrome  Rationale for Evaluation and Treatment: Rehabilitation  THERAPY DIAG:  Pain in thoracic spine  Muscle weakness (generalized)  Chronic pain of left knee  Abnormal posture  Difficulty in walking, not elsewhere classified  ONSET DATE: 2 weeks ago   SUBJECTIVE:  SUBJECTIVE STATEMENT: Pt reporting annoying pain is enough to let her know its there.    PERTINENT HISTORY:  COPD, PTSD, Rt THA 8/ 2020. Left THA 03/2019, Bipolar disorder, anxiety  PAIN:  Are you having pain? Yes: NPRS scale: 1/10 Pain location: upper back and Lt knee Pain description: stabbing; weakness, hot knife and twisting in back Aggravating factors: walking Relieving factors: lying down  PRECAUTIONS:  None  RED FLAGS: None   WEIGHT BEARING RESTRICTIONS:  No  FALLS:  Has patient fallen in last 6 months? 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 and bilateral lateral recess stenosis at L3-L4, there is also severe facet arthropathy and medially oriented facet synovial cyst at this level  PATIENT SURVEYS:  04/30/23: FOTO 33 (predicted 44)  SENSATION: WFL  POSTURE:  rounded shoulders and forward head  GAIT: 04/30/23:  Distance walked: 100' Assistive device utilized: None Level of assistance: Modified independence Comments: antalgic gait   BACK  PALPATION: 04/30/23: very tender to palpation along Lt rhomboids   UPPER EXTREMITY MMT:  MMT Right eval Left eval  Shoulder flexion 3/5 3/5 with pain  Shoulder extension    Shoulder abduction 3/5 3/5 with pain  Shoulder adduction    Shoulder extension    Shoulder internal rotation    Shoulder external rotation     (Blank rows = not tested)   LUMBAR ROM:   ROM A/PROM  eval  Flexion Limited  50%  Extension WNL  Right Quadrant Limited 25% with pain  Left Quadrant Limited  25%   (Blank rows = not tested)  Neck ROM:   ROM AROM  05/06/23 AROM 05/13/23  Flexion 15 deg 18  Extension 20 deg 20  Left rotation 15 deg 30  Right rotation 25 deg 35     Lower Extremity Right 05/15/2023 Left 05/15/2023   A/PROM MMT A/PROM MMT  Hip Flexion  3/5  3/5  Hip Extension      Hip Abduction      Hip Adduction      Hip Internal rotation      Hip External rotation      Knee Flexion  4/5  4/5  Knee Extension  4/5  4/5  Ankle Dorsiflexion      Ankle Plantarflexion      Ankle Inversion      Ankle Eversion       (Blank rows = not tested)  * pain  ___________________________________________________________________________________  TODAY's TREATMENT:  ___________________________________                                               DATE: 05/15/23 TherEx: Pulleys x 2 minutes flexion Nu step L4 X 5 min UE/LE Rows: green TB 2 x 15  Shoulder extension: red TB 2 x 15  Cervical rotation using towel roll for added stretch x 5 to each side holding 10 sec Chin tuck trying to squeez 10 holding 3 sec Seated cervical isometrics sidebend, flexion, extension all 5 sec X 10 Pball rolling up wall 5 sec X 10 for shoulder flexion Pball seated lumbar flexion stretch 5 sec X 10 Modalities:  7 minutes of moist heat cervical spine and mid/low back DATE: 05/13/23 TherEx: Pulleys x 2 minutes flexion Rows: green TB 2 x 15  Shoulder extension: red TB 2 x 15  Cervical rotation using towel roll for added stretch x 5 to each side holding 10 sec Chin tuck trying to squeeze a towel roll placed under her chin. X 10 holding 3 sec Cervical side-bending; x 5 holding 10 sec Seated cervical isometrics with ball behind pt's head x 10 holding 5 sec Modalities:  7 minutes of moist heat cervical spine and mid/low back         TREATMENT:                                                                                                                              [facility] Adult PT Treatment:                                                DATE: 05/09/23 Therapeutic Exercise: Nu step L5 UE/LE 5 min during subjective Cervical sidebending isos 2x5 BIL Seated swiss ball flexion isometric 2x10 cues for breath control, avoiding valsalva Red band unilat shoulder ext x10 BIL Green band row 2x10 cues for form and posture  Green band BIL shoulder ext x10  Seated cervical rotation assisted by towel x6 BIL Seated chin tuck x8 cues for comfortable ROM     DATE:  05/06/23 Nu step L5 X 6 min UE/LE Standing rows red 2X10 bilat Standing shoulder extension red 2X10 bilat Seated lumbar flexion stretch with red ball 5 sec X 10 Seated ab set with pball isometric shoulder ext 5  sec X 10 and isometric hip flexion 5 sec X 5 bilat Seated cervical AAROM with towel 5 sec X 10 bilat Seated cervical isometrics side bending, flexion, extension all 5 sec X 10 reps each Seated SLR 2X10 bilat Seated chin tucks X10 HEP review.      PATIENT EDUCATION:  Education details: HEP Person educated: Patient Education method: Programmer, multimedia, Facilities manager, and Automotive engineer  comprehension: verbalized understanding, returned demonstration, and needs further education  HOME EXERCISE PROGRAM: Access Code: [email or web address] URL: [email or web address] Date: 05/06/2023 Prepared by: Ivery Quale  Exercises - Standing Paraspinals Mobilization with Small Ball on Wall  - 3 x daily - 7 x weekly - 1 sets - 1 reps - 2-3 min hold - Standing Scapular Retraction  - 1 x daily - 7 x weekly - 1 sets - 10 reps - 5 sec hold - Standing Backward Shoulder Rolls  - 1 x daily - 7 x weekly - 1 sets - 10 reps - Seated Knee Extension AROM  - 1 x daily - 7 x weekly - 1-2 sets - 10 reps - Seated Straight Leg Raise  - 1 x daily - 7 x weekly - 1-2 sets - 10 reps - Sit to Stand  - 1 x daily - 7 x weekly - 1-2 sets - 10 reps - Seated Assisted Cervical Rotation with Towel  - 2 x daily - 6 x weekly - 1 sets - 10 reps - 5 hold - Seated Isometric Cervical Sidebending  - 2 x daily - 6 x weekly - 1 sets - 10 reps - 5 hold - Seated Isometric Cervical Extension  - 2 x daily - 6 x weekly - 1 sets - 10 reps - 5 hold - Seated Isometric Cervical Flexion  - 2 x daily - 6 x weekly - 1 sets - 10 reps - 6 hold - Seated Passive Cervical Retraction  - 2 x daily - 6 x weekly - 1 sets - 10 reps   ASSESSMENT:  CLINICAL IMPRESSION: She had good tolerance to exercise session today and shows good understanding of HEP. She will miss next week due to vacation and we will reassess after that.      OBJECTIVE IMPAIRMENTS: Abnormal gait, decreased balance, decreased mobility, difficulty walking, decreased ROM, decreased  strength, increased fascial restrictions, increased muscle spasms, postural dysfunction, and pain.   ACTIVITY LIMITATIONS: carrying, lifting, bending, sitting, standing, squatting, sleeping, stairs, transfers, and locomotion level  PARTICIPATION LIMITATIONS: meal prep, cleaning, laundry, driving, shopping, and community activity  PERSONAL FACTORS: 3+ comorbidities: COPD, PTSD,  Rt THA 8/ 2020. Left THA 03/2019, Bipolar disorder, anxiety  are also affecting patient's functional outcome.   REHAB POTENTIAL: Good  CLINICAL DECISION MAKING: Evolving/moderate complexity  EVALUATION COMPLEXITY: Moderate   GOALS: Goals reviewed with patient? Yes  SHORT TERM GOALS: Target date: 05/21/2023  Independent with initial HEP Goal status: MET 05/15/23  LONG TERM GOALS: Target date: 06/11/2023  Independent with final HEP Goal status: INITIAL  2.  FOTO score improved to 44 Goal status: INITIAL  3.  Bil UE shoulder strength improved to 4/5 for improved function Goal status: INIITAL  4.  Report pain < 2/10 in upper back with reaching and overhead activities for improved function Goal status: INITIAL  5. Pt will improve neck rotation ROM >35 degrees each side for improved scanning of her enviornment      PLAN:  PT FREQUENCY: 1-2x/week  PT DURATION: 6 weeks  PLANNED INTERVENTIONS: Therapeutic exercises, Therapeutic activity, Neuromuscular re-education, Balance training, Gait training, Patient/Family education, Self Care, Joint mobilization, Aquatic Therapy, Dry Needling, Electrical stimulation, Spinal manipulation, Spinal mobilization, Cryotherapy, Moist heat, Taping, Traction, Manual therapy, and Re-evaluation.  PLAN FOR NEXT SESSION: neck and back exercises, progress postural and strengthening exercises, she's unable to spend a lot of time supine or prone so most exercises need to be sitting/standing    NEXT MD VISIT: PRN  Ivery Quale, PT, DPT 05/15/23 1:54 PM

## 2023-05-27 ENCOUNTER — Ambulatory Visit (INDEPENDENT_AMBULATORY_CARE_PROVIDER_SITE_OTHER): Payer: Medicare Other | Admitting: Physical Therapy

## 2023-05-27 ENCOUNTER — Encounter: Payer: Self-pay | Admitting: Physical Therapy

## 2023-05-27 DIAGNOSIS — G8929 Other chronic pain: Secondary | ICD-10-CM

## 2023-05-27 DIAGNOSIS — M25562 Pain in left knee: Secondary | ICD-10-CM | POA: Diagnosis not present

## 2023-05-27 DIAGNOSIS — R293 Abnormal posture: Secondary | ICD-10-CM | POA: Diagnosis not present

## 2023-05-27 DIAGNOSIS — M546 Pain in thoracic spine: Secondary | ICD-10-CM

## 2023-05-27 DIAGNOSIS — R262 Difficulty in walking, not elsewhere classified: Secondary | ICD-10-CM

## 2023-05-27 DIAGNOSIS — M6281 Muscle weakness (generalized): Secondary | ICD-10-CM | POA: Diagnosis not present

## 2023-05-27 NOTE — Therapy (Signed)
OUTPATIENT PHYSICAL THERAPY TREATMENT   Patient Name: Judy [name] MRN: [id number] DOB:[birth year], 72 y.o., female Today's Date: 05/27/2023  END OF SESSION:  PT End of Session - 05/27/23 1400     Visit Number 6    Number of Visits 12    Date for PT Re-Evaluation 06/11/23    Authorization Type Medicare/[employer]    Progress Note Due on Visit 10    PT Start Time 1345    PT Stop Time 1418    PT Time Calculation (min) 33 min    Activity Tolerance Patient tolerated treatment well;No increased pain    Behavior During Therapy WFL for tasks assessed/performed               Past Medical History:  Diagnosis Date   Anxiety    Arthritis    Avascular necrosis (HCC)    Left hip   Avascular necrosis of bone of hip, 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   Bipolar 1 disorder (HCC)    COPD (chronic obstructive pulmonary disease) (HCC)    Depression    Dyspnea    Pneumonia    PTSD (post-traumatic stress disorder)    Past Surgical History:  Procedure Laterality Date   bone spurs foot Right    CATARACT EXTRACTION Bilateral    COLONOSCOPY     COLONOSCOPY  07/2022   HAND SURGERY Left    JOINT REPLACEMENT     TONSILLECTOMY     TONSILLECTOMY AND ADENOIDECTOMY     TOTAL HIP ARTHROPLASTY Left 03/19/2019   Procedure: LEFT TOTAL HIP ARTHROPLASTY ANTERIOR APPROACH;  Surgeon: [name], MD;  Location: [facility];  Service: Orthopedics;  Laterality: Left;   TOTAL HIP ARTHROPLASTY Right 06/04/2019   Procedure: RIGHT TOTAL HIP ARTHROPLASTY ANTERIOR APPROACH;  Surgeon: [name], MD;  Location: [facility];  Service: Orthopedics;  Laterality: Right;   TOTAL KNEE ARTHROPLASTY Left 12/28/2021   Procedure: Left TOTAL KNEE ARTHROPLASTY;  Surgeon: [name], MD;  Location: [facility];  Service: Orthopedics;  Laterality: Left;   Patient Active Problem List   Diagnosis Date Noted   Prediabetes 08/09/2022    Restless legs syndrome 08/09/2022   Status post total left knee replacement 12/28/2021   Primary osteoarthritis of left knee 10/25/2021   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) 11/30/2020   Status post total replacement of right hip 06/04/2019   Status post total replacement of left hip 03/19/2019   PTSD (post-traumatic stress disorder)    COPD (chronic obstructive pulmonary disease) (HCC)    Bipolar 1 disorder (HCC)    Anxiety    B12 deficiency 12/09/2018   Hyperglycemia 04/14/2017    PCP: Karie Georges, MD  REFERRING PROVIDER: Juanda Chance, NP  REFERRING DIAG: [phone] (ICD-10-CM) - Myofascial pain syndrome  Rationale for Evaluation and Treatment: Rehabilitation  THERAPY DIAG:  Pain in thoracic spine  Muscle weakness (generalized)  Chronic pain of left knee  Abnormal posture  Difficulty in walking, not elsewhere classified  ONSET DATE: 2 weeks ago   SUBJECTIVE:  SUBJECTIVE STATEMENT: Pt reporting the pain is just overall annoying. She started back at the gym this week.   PERTINENT HISTORY:  COPD, PTSD, Rt THA 8/ 2020. Left THA 03/2019, Bipolar disorder, anxiety  PAIN:  Are you having pain? Yes: NPRS scale: 1/10 Pain location: upper back and Lt knee Pain description: stabbing; weakness, hot knife and twisting in back Aggravating factors: walking Relieving factors: lying down  PRECAUTIONS:  None  RED FLAGS: None   WEIGHT BEARING RESTRICTIONS:  No  FALLS:  Has patient fallen in last 6 months? No  LIVING ENVIRONMENT: Lives with: lives alone Lives in: House/apartment Stairs: Yes: External: 4 steps; can reach both  OCCUPATION:  retired  PLOF:  Independent and Leisure: watch TV/election results  PATIENT GOALS:  Feel better before heading to  beach in 2 week   OBJECTIVE: (objective measures completed at initial evaluation unless otherwise dated)   DIAGNOSTIC FINDINGS:  Lumbar MRI imaging from January of this year exhibits mild to moderate spinal canal stenosis and bilateral lateral recess stenosis at L3-L4, there is also severe facet arthropathy and medially oriented facet synovial cyst at this level  PATIENT SURVEYS:  04/30/23: FOTO 33 (predicted 44)  SENSATION: WFL  POSTURE:  rounded shoulders and forward head  GAIT: 04/30/23:  Distance walked: 100' Assistive device utilized: None Level of assistance: Modified independence Comments: antalgic gait   BACK  PALPATION: 04/30/23: very tender to palpation along Lt rhomboids   UPPER EXTREMITY MMT:  MMT Right eval Left eval  Shoulder flexion 3/5 3/5 with pain  Shoulder extension    Shoulder abduction 3/5 3/5 with pain  Shoulder adduction    Shoulder extension    Shoulder internal rotation    Shoulder external rotation     (Blank rows = not tested)   LUMBAR ROM:   ROM A/PROM  eval  Flexion Limited  50%  Extension WNL  Right Quadrant Limited 25% with pain  Left Quadrant Limited  25%   (Blank rows = not tested)  Neck ROM:   ROM AROM  05/06/23 AROM 05/13/23  Flexion 15 deg 18  Extension 20 deg 20  Left rotation 15 deg 30  Right rotation 25 deg 35     Lower Extremity Right 05/27/2023 Left 05/27/2023   A/PROM MMT A/PROM MMT  Hip Flexion  3/5  3/5  Hip Extension      Hip Abduction      Hip Adduction      Hip Internal rotation      Hip External rotation      Knee Flexion  4/5  4/5  Knee Extension  4/5  4/5  Ankle Dorsiflexion      Ankle Plantarflexion      Ankle Inversion      Ankle Eversion       (Blank rows = not tested)  * pain  ___________________________________________________________________________________  TODAY's TREATMENT:  ___________________________________                                               DATE: 05/27/23 TherEx: Pulleys x 2 minutes flexion Nu step L4 X 6 min UE/LE Lat pulldown machine 20# 2X10 Chest press machine 10# 2X10 Cervical rotation using towel roll for added stretch x 5 to each side holding 10 sec Chin tuck trying to squeez 10 holding 3 sec Pball rolling up wall 5 sec X 10 for shoulder flexion  DATE: 05/15/23 TherEx: Pulleys x 2 minutes flexion Nu step L4 X 5 min UE/LE Rows: green TB 2 x 15  Shoulder extension: red TB 2 x 15  Cervical rotation using towel roll for added stretch x 5 to each side holding 10 sec Chin tuck trying to squeez 10 holding 3 sec Seated cervical isometrics sidebend, flexion, extension all 5 sec X 10 Pball rolling up wall 5 sec X 10 for shoulder flexion Pball seated lumbar flexion stretch 5 sec X 10 Modalities:  7 minutes of moist heat cervical spine and mid/low back DATE: 05/13/23 TherEx: Pulleys x 2 minutes flexion Rows: green TB 2 x 15  Shoulder extension: red TB 2 x 15  Cervical rotation using towel roll for added stretch x 5 to each side holding 10 sec Chin tuck trying to squeeze a towel roll placed under her chin. X 10 holding 3 sec Cervical side-bending; x 5 holding 10 sec Seated cervical isometrics with ball behind pt's head x 10 holding 5 sec Modalities:  7 minutes of moist heat cervical spine and mid/low back       PATIENT EDUCATION:  Education details: HEP Person educated: Patient Education method: Programmer, multimedia, Facilities manager, and Handouts Education comprehension: verbalized understanding, returned demonstration, and needs further education  HOME EXERCISE PROGRAM: Access Code: [email or web address] URL: [email or web address] Date: 05/06/2023 Prepared by: Ivery Quale  Exercises - Standing Paraspinals Mobilization with Small Ball on Wall  - 3 x daily - 7 x weekly - 1 sets - 1 reps - 2-3 min hold - Standing  Scapular Retraction  - 1 x daily - 7 x weekly - 1 sets - 10 reps - 5 sec hold - Standing Backward Shoulder Rolls  - 1 x daily - 7 x weekly - 1 sets - 10 reps - Seated Knee Extension AROM  - 1 x daily - 7 x weekly - 1-2 sets - 10 reps - Seated Straight Leg Raise  - 1 x daily - 7 x weekly - 1-2 sets - 10 reps - Sit to Stand  - 1 x daily - 7 x weekly - 1-2 sets - 10 reps - Seated Assisted Cervical Rotation with Towel  - 2 x daily - 6 x weekly - 1 sets - 10 reps - 5 hold - Seated Isometric Cervical Sidebending  - 2 x daily - 6 x weekly - 1 sets - 10 reps - 5 hold - Seated Isometric Cervical Extension  - 2 x daily - 6 x weekly - 1 sets - 10 reps - 5 hold - Seated Isometric Cervical Flexion  - 2 x daily - 6 x weekly - 1 sets - 10 reps - 6 hold - Seated Passive Cervical Retraction  - 2 x daily - 6 x weekly - 1 sets - 10 reps   ASSESSMENT:  CLINICAL IMPRESSION: She has started back at the  gym and I did review with her which exercises and equipment would be recommended to use. She shows good understanding of this. She has one more PT visit left at the moment.       OBJECTIVE IMPAIRMENTS: Abnormal gait, decreased balance, decreased mobility, difficulty walking, decreased ROM, decreased strength, increased fascial restrictions, increased muscle spasms, postural dysfunction, and pain.   ACTIVITY LIMITATIONS: carrying, lifting, bending, sitting, standing, squatting, sleeping, stairs, transfers, and locomotion level  PARTICIPATION LIMITATIONS: meal prep, cleaning, laundry, driving, shopping, and community activity  PERSONAL FACTORS: 3+ comorbidities: COPD, PTSD,  Rt THA 8/ 2020. Left THA 03/2019, Bipolar disorder, anxiety  are also affecting patient's functional outcome.   REHAB POTENTIAL: Good  CLINICAL DECISION MAKING: Evolving/moderate complexity  EVALUATION COMPLEXITY: Moderate   GOALS: Goals reviewed with patient? Yes  SHORT TERM GOALS: Target date: 05/21/2023  Independent with initial  HEP Goal status: MET 05/15/23  LONG TERM GOALS: Target date: 06/11/2023  Independent with final HEP Goal status: INITIAL  2.  FOTO score improved to 44 Goal status: INITIAL  3.  Bil UE shoulder strength improved to 4/5 for improved function Goal status: INIITAL  4.  Report pain < 2/10 in upper back with reaching and overhead activities for improved function Goal status: INITIAL  5. Pt will improve neck rotation ROM >35 degrees each side for improved scanning of her enviornment      PLAN:  PT FREQUENCY: 1-2x/week  PT DURATION: 6 weeks  PLANNED INTERVENTIONS: Therapeutic exercises, Therapeutic activity, Neuromuscular re-education, Balance training, Gait training, Patient/Family education, Self Care, Joint mobilization, Aquatic Therapy, Dry Needling, Electrical stimulation, Spinal manipulation, Spinal mobilization, Cryotherapy, Moist heat, Taping, Traction, Manual therapy, and Re-evaluation.  PLAN FOR NEXT SESSION: neck and back exercises, progress postural and strengthening exercises, she's unable to spend a lot of time supine or prone so most exercises need to be sitting/standing    NEXT MD VISIT: PRN  Ivery Quale, PT, DPT 05/27/23 2:20 PM

## 2023-05-28 NOTE — Progress Notes (Signed)
[name] - 72 y.o. female MRN [id number]  Date of birth: April 02, 1951  Office Visit Note: Visit Date: 05/13/2023 PCP: Karie Georges, MD Referred by: Karie Georges, MD  Subjective: Chief Complaint  Patient presents with   Lower Back - Pain   HPI: Judy [name] is a 72 y.o. female who comes in today for back and hip pain.  She recently seen and we felt like we needed to complete an L3 transforaminal injection to more hip pain that she was having.  Since we have seen her she has been continuing physical therapy and does feel quite a bit better from that.  She is status post radiofrequency ablation and she feels like that is really helped quite a bit now at this point as well.  Overall she is doing fairly well and would like to continue with physical therapy.  No new injury or concerns.  No radicular pain down the legs.  No paresthesias no weakness.      Review of Systems  Musculoskeletal:  Positive for back pain and joint pain.  All other systems reviewed and are negative.  Otherwise per HPI.  Assessment & Plan: Visit Diagnoses:    ICD-10-CM   1. Lumbar radiculopathy  M54.16 DISCONTINUED: methylPREDNISolone acetate (DEPO-MEDROL) injection 80 mg    CANCELED: XR C-ARM NO REPORT    CANCELED: Epidural Steroid injection       Plan: No additional findings.   Meds & Orders:  Meds ordered this encounter  Medications   DISCONTD: methylPREDNISolone acetate (DEPO-MEDROL) injection 80 mg   No orders of the defined types were placed in this encounter.   Follow-up: Return if symptoms worsen or fail to improve.   Procedures: No procedures performed      Clinical History: MRI LUMBAR SPINE WITHOUT CONTRAST   TECHNIQUE: Multiplanar, multisequence MR imaging of the lumbar spine was performed. No intravenous contrast was administered.   COMPARISON:  01/06/2020   FINDINGS: Segmentation:  5 lumbar type vertebral bodies.   Alignment:  Mild dextrocurvature.   No significant listhesis.   Vertebrae: No acute fracture or suspicious osseous lesion. Redemonstrated compression deformity of L1, which appears unchanged. Increased T2 signal in the T12-L1 disc, with some loss of cortical signal in the T12-L1 endplates (series 5, image 8-9).   Conus medullaris and cauda equina: Conus extends to the L2 level. Conus and cauda equina appear normal.   Paraspinal and other soft tissues: Atrophy of the right psoas muscle, which is new from the prior exam.   Disc levels:   T12-L1: Minimal disc bulge. Mild facet arthropathy. No spinal canal stenosis or neural foraminal narrowing.   L1-L2: Minimal disc bulge. No spinal canal stenosis or neural foraminal narrowing.   L2-L3: Minimal disc bulge. No spinal canal stenosis or neural foraminal narrowing.   L3-L4: Mild disc bulge, which has progressed from prior exam, with superimposed left paracentral disc extrusion with 12 mm of caudal migration, which is new from the prior exam. Severe facet arthropathy with suspected medially oriented facet synovial cyst, which measures 2 x 2 x 4 mm (series 13, image 26). Mild-to-moderate spinal canal stenosis. Narrowing of the lateral recesses. Mild left neural foraminal narrowing, which is new from the prior exam.   L4-L5: Disc height loss with small left paracentral disc protrusion, which narrows the left lateral recess. No spinal canal stenosis or neural foraminal narrowing.   L5-S1: Large left lateral osteophyte, which may abut the exiting left L5 nerve. No significant disc  bulge. Moderate facet arthropathy. No spinal canal stenosis or neural foraminal narrowing.   IMPRESSION: 1. Increased T2 signal in the T12-L1 disc, with some loss of cortical signal in the T12-L1 endplates. While this could be degenerative in nature, early discitis/osteomyelitis could have a similar appearance. Recommend correlation with inflammatory markers. 2. L3-L4 mild-to-moderate spinal  canal stenosis and mild left neural foraminal narrowing, which is new from the prior exam. Narrowing of the lateral recesses at this level could affect the descending L4 nerve roots. 3. L4-L5 narrowing of the left lateral recess, which could affect the descending left L5 nerve. 4. L5-S1 large left lateral osteophyte, which may abut the exiting left L5 nerve. 5. Atrophy of the right psoas muscle, which is new from the prior exam.     Electronically Signed   By: Wiliam Ke M.D.   On: 10/29/2022 12:33   She reports that she quit smoking about 3 years ago. Her smoking use included cigarettes. She started smoking about 46 years ago. She has a 53.8 pack-year smoking history. She has never been exposed to tobacco smoke. She has never used smokeless tobacco.  Recent Labs    06/27/22 1723 12/23/22 1317  HGBA1C 6.4 6.2*    Objective:  VS:  HT:    WT:   BMI:     BP:(!) 151/70  HR:74bpm  TEMP: ( )  RESP:  Physical Exam Vitals and nursing note reviewed.  Constitutional:      General: She is not in acute distress.    Appearance: Normal appearance. She is not ill-appearing.  HENT:     Head: Normocephalic and atraumatic.     Right Ear: External ear normal.     Left Ear: External ear normal.  Eyes:     Extraocular Movements: Extraocular movements intact.  Cardiovascular:     Rate and Rhythm: Normal rate.     Pulses: Normal pulses.  Pulmonary:     Effort: Pulmonary effort is normal. No respiratory distress.  Abdominal:     General: There is no distension.     Palpations: Abdomen is soft.  Musculoskeletal:        General: Tenderness present.     Cervical back: Neck supple.     Right lower leg: No edema.     Left lower leg: No edema.     Comments: Patient has good distal strength with no pain over the greater trochanters.  No clonus or focal weakness.  Skin:    Findings: No erythema, lesion or rash.  Neurological:     General: No focal deficit present.     Mental Status:  She is alert and oriented to person, place, and time.     Sensory: No sensory deficit.     Motor: No weakness or abnormal muscle tone.     Coordination: Coordination normal.  Psychiatric:        Mood and Affect: Mood normal.        Behavior: Behavior normal.     Ortho Exam  Imaging: No results found.  Past Medical/Family/Surgical/Social History: Medications & Allergies reviewed per EMR, new medications updated. 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 [facility]) 11/30/2020   Status post total replacement of right hip 06/04/2019   Status post total replacement of left hip 03/19/2019  PTSD (post-traumatic stress disorder)    COPD (chronic obstructive pulmonary disease) (HCC)    Bipolar 1 disorder (HCC)    Anxiety    B12 deficiency 12/09/2018   Hyperglycemia 04/14/2017   Past Medical History:  Diagnosis Date   Anxiety    Arthritis    Avascular necrosis (HCC)    Left hip   Avascular necrosis of bone of hip, 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   Bipolar 1 disorder (HCC)    COPD (chronic obstructive pulmonary disease) (HCC)    Depression    Dyspnea    Pneumonia    PTSD (post-traumatic stress disorder)    Family History  Problem Relation Age of Onset   CAD Mother 34   COPD Mother    Depression Mother    Heart attack Father    Colon polyps Sister    Cancer Maternal Aunt        growth on face- underwent chemo   Heart attack Maternal Grandmother    Early death Maternal Grandfather    Hearing loss Paternal Grandfather    Asthma Daughter    Bipolar disorder Son    Past Surgical History:  Procedure Laterality Date   bone spurs foot Right    CATARACT EXTRACTION Bilateral    COLONOSCOPY     COLONOSCOPY   07/2022   HAND SURGERY Left    JOINT REPLACEMENT     TONSILLECTOMY     TONSILLECTOMY AND ADENOIDECTOMY     TOTAL HIP ARTHROPLASTY Left 03/19/2019   Procedure: LEFT TOTAL HIP ARTHROPLASTY ANTERIOR APPROACH;  Surgeon: [name], MD;  Location: [facility];  Service: Orthopedics;  Laterality: Left;   TOTAL HIP ARTHROPLASTY Right 06/04/2019   Procedure: RIGHT TOTAL HIP ARTHROPLASTY ANTERIOR APPROACH;  Surgeon: [name], MD;  Location: [facility];  Service: Orthopedics;  Laterality: Right;   TOTAL KNEE ARTHROPLASTY Left 12/28/2021   Procedure: Left TOTAL KNEE ARTHROPLASTY;  Surgeon: [name], MD;  Location: [facility];  Service: Orthopedics;  Laterality: Left;   Social History   Occupational History   Occupation: retired  Tobacco Use   Smoking status: Former    Current packs/day: 0.00    Average packs/day: 1.3 packs/day for 43.0 years (53.8 ttl pk-yrs)    Types: Cigarettes    Start date: 05/19/1977    Quit date: 05/19/2020    Years since quitting: 3.0    Passive exposure: Never   Smokeless tobacco: Never  Vaping Use   Vaping status: Never Used  Substance and Sexual Activity   Alcohol use: Yes    Comment: occ   Drug use: No   Sexual activity: Not on file

## 2023-05-28 NOTE — Progress Notes (Signed)
Office Visit Note  [name]             Date of Birth: [birth year]           MRN: [id number]             PCP: Karie Georges, MD Referring: Karie Georges, MD Visit Date: 06/11/2023 Occupation: @GUAROCC @  Subjective:  Discuss cimzia    History of Present Illness: Judy [name] is a 72 y.o. female with history of ankylosing spondylitis and osteoarthritis.  Her last dose of Cimzia was administered on 03/13/23.  Patient states that she is hesitant to proceed with resuming Cimzia since she has not found Cimzia to provide any clinical benefit.  She has not noticed any new or worsening symptoms during the gap in therapy.  She has not noticed any change in her symptoms since initially starting on Cimzia.  She has been going to physical therapy for her left knee and C-spine which has been helpful.  She is also been going to the gym 3 days a week.  She is planning on having a neck injection after completing physical therapy.   No recent or recurrent infections.    Activities of Daily Living:  Patient reports morning stiffness for 15-20 minutes.   Patient Reports nocturnal pain.  Difficulty dressing/grooming: Reports Difficulty climbing stairs: Reports Difficulty getting out of chair: Denies Difficulty using hands for taps, buttons, cutlery, and/or writing: Denies  Review of Systems  Constitutional:  Positive for fatigue.  HENT:  Positive for mouth dryness. Negative for mouth sores.   Eyes:  Negative for dryness.  Respiratory:  Negative for shortness of breath.   Cardiovascular:  Negative for chest pain and palpitations.  Gastrointestinal:  Negative for blood in stool, constipation and diarrhea.  Endocrine: Negative for increased urination.  Genitourinary:  Negative for involuntary urination.  Musculoskeletal:  Positive for joint pain, gait problem, joint pain, joint swelling, myalgias, muscle weakness, morning stiffness and myalgias. Negative for muscle  tenderness.  Skin:  Negative for color change, rash, hair loss and sensitivity to sunlight.  Allergic/Immunologic: Negative for susceptible to infections.  Neurological:  Negative for dizziness and headaches.  Hematological:  Negative for swollen glands.  Psychiatric/Behavioral:  Positive for depressed mood and sleep disturbance. The patient is nervous/anxious.     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   Status post total replacement of left hip 03/19/2019   PTSD (post-traumatic stress disorder)    COPD (chronic obstructive pulmonary disease) (HCC)    Bipolar 1 disorder (HCC)    Anxiety    B12 deficiency 12/09/2018   Hyperglycemia 04/14/2017    Past Medical History:  Diagnosis Date   Anxiety    Arthritis    Avascular necrosis (HCC)    Left hip   Avascular necrosis of bone of hip, 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   Bipolar 1 disorder (HCC)    COPD (chronic obstructive pulmonary disease) (HCC)    Depression    Dyspnea    Pneumonia    PTSD (post-traumatic stress disorder)     Family History  Problem Relation Age of Onset  CAD Mother 16   COPD Mother    Depression Mother    Heart attack Father    Colon polyps Sister    Cancer Maternal Aunt        growth on face- underwent chemo   Heart attack Maternal Grandmother    Early death Maternal Grandfather    Hearing loss Paternal Grandfather    Asthma Daughter    Bipolar disorder Son    Past Surgical History:  Procedure Laterality Date   bone spurs foot Right    CATARACT EXTRACTION Bilateral    COLONOSCOPY     COLONOSCOPY  07/2022   HAND SURGERY Left     JOINT REPLACEMENT     TONSILLECTOMY     TONSILLECTOMY AND ADENOIDECTOMY     TOTAL HIP ARTHROPLASTY Left 03/19/2019   Procedure: LEFT TOTAL HIP ARTHROPLASTY ANTERIOR APPROACH;  Surgeon: [name], MD;  Location: [facility];  Service: Orthopedics;  Laterality: Left;   TOTAL HIP ARTHROPLASTY Right 06/04/2019   Procedure: RIGHT TOTAL HIP ARTHROPLASTY ANTERIOR APPROACH;  Surgeon: [name], MD;  Location: [facility];  Service: Orthopedics;  Laterality: Right;   TOTAL KNEE ARTHROPLASTY Left 12/28/2021   Procedure: Left TOTAL KNEE ARTHROPLASTY;  Surgeon: [name], MD;  Location: [facility];  Service: Orthopedics;  Laterality: Left;   Social History   Social History Narrative   Not on file   Immunization History  Administered Date(s) Administered   Influenza, High Dose Seasonal PF 08/25/2018   Moderna Sars-Covid-2 Vaccination 11/08/2019, 12/05/2019, 10/09/2020   Pneumococcal Conjugate-13 09/23/2018   Pneumococcal Polysaccharide-23 08/27/2016   Tdap 10/07/2012   Yellow Fever 07/05/2008     Objective: Vital Signs: BP 122/79 (BP Location: Left Arm, Patient Position: Sitting, Cuff Size: Large)   Pulse 67   Resp 12   Ht 5\' 10"  (1.778 m)   Wt 241 lb (109.3 kg)   BMI 34.58 kg/m    Physical Exam Vitals and nursing note reviewed.  Constitutional:      Appearance: She is well-developed.  HENT:     Head: Normocephalic and atraumatic.  Eyes:     Conjunctiva/sclera: Conjunctivae normal.  Cardiovascular:     Rate and Rhythm: Normal rate and regular rhythm.     Heart sounds: Normal heart sounds.  Pulmonary:     Effort: Pulmonary effort is normal.     Breath sounds: Normal breath sounds.  Abdominal:     General: Bowel sounds are normal.     Palpations: Abdomen is soft.  Musculoskeletal:     Cervical back: Normal range of motion.  Lymphadenopathy:     Cervical: No cervical adenopathy.  Skin:    General: Skin is warm and dry.     Capillary Refill:  Capillary refill takes less than 2 seconds.  Neurological:     Mental Status: She is alert and oriented to person, place, and time.  Psychiatric:        Behavior: Behavior normal.      Musculoskeletal Exam: Patient remained seated during the examination.  C-spine has severely limited range of motion.  Thoracic kyphosis noted.  Shoulder joints have good range of motion.  Flexion contractures noted in both elbows.  Wrist joints, MCPs, PIPs, DIPs have good range of motion with no synovitis.  Complete fist formation bilaterally.  Hip replacements difficult to assess in seated position.  Right knee joint has good ROM.  Left knee has slightly limited extension with warmth but no effusion.  Ankle joint have good ROM  with no tenderness or joint swelling.   CDAI Exam: CDAI Score: -- Patient Global: --; Provider Global: -- Swollen: --; Tender: -- Joint Exam 06/11/2023   No joint exam has been documented for this visit   There is currently no information documented on the homunculus. Go to the Rheumatology activity and complete the homunculus joint exam.  Investigation: No additional findings.  Imaging: No results found.  Recent Labs: Lab Results  Component Value Date   WBC 5.1 01/08/2023   HGB 12.1 01/08/2023   PLT 229 01/08/2023   NA 137 01/08/2023   K 4.6 01/08/2023   CL 105 01/08/2023   CO2 22 01/08/2023   GLUCOSE 99 01/08/2023   BUN 18 01/08/2023   CREATININE 1.27 (H) 01/08/2023   BILITOT 0.4 01/08/2023   ALKPHOS 144 (H) 10/30/2020   AST 36 (H) 01/08/2023   ALT 49 (H) 01/08/2023   PROT 6.9 01/08/2023   ALBUMIN 4.2 10/30/2020   CALCIUM 9.7 01/08/2023   GFRAA 54 (L) 04/11/2021   QFTBGOLDPLUS NEGATIVE 04/18/2022    Speciality Comments: Cimzia completed loading on June 22, 2020  Procedures:  No procedures performed Allergies: Celexa [citalopram hydrobromide] and Gabapentin   Assessment / Plan:     Visit Diagnoses: 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  She has no synovitis or dactylitis on examination today.  She continues to have chronic pain involving multiple joints as well as limited mobility due to the fusion of her spine.  Patient had not noticed any clinical improvement since reinitiating Cimzia from 04/18/2022 until 03/13/2023. She has been going to physical therapy for her neck and is planning on to proceed with a neck injection soon.   Discussed resuming Cimzia to help prevent future flares and to hopefully slow the progression of the disease but she is apprehensive at this time.  She has declined restarting Cimzia at this time.  She will notify us if she develops any new or worsening symptoms including increased morning stiffness, nocturnal pain, or worsening mobility.  She will follow in 3 months or sooner if needd.   High risk medication use -Patient's last dose of Cimzia was administered on 03/13/2023.  previously reinitiated Cimzia on 04/18/2022. Patient declined to have updated lab work today since she does not plan to resume Cimzia at this time.  Future orders for CBC, CMP, and TB Gold were placed today which will need to be drawn prior to reinitiating therapy in the future if needed. CBC and CMP updated on 01/08/23. Orders for CBC and CMP released today.   TB gold negative on 04/18/22. Order for TB gold released today.   Discussed the importance of holding cimzia if she develops signs or symptoms of an infection and to resume once the infection has completely cleared.  - Plan: COMPLETE METABOLIC PANEL WITH GFR, CBC with Differential/Platelet, QuantiFERON-TB Gold Plus  Screening for tuberculosis - Future order for TB gold placed today.  Patient declined to have updated lab work today since she does not plan to resume cimzia at this time. Plan: QuantiFERON-TB Gold Plus  Contracture of joint of both elbows: Unchanged.  No  tenderness or inflammation noted.  Bilateral hand pain: Ultrasound of both hands performed on 10/24/2022 which revealed left second digit tenosynovitis.  Patient continues to experience pain and stiffness in both hands.  No obvious synovitis was noted on examination today.  DDD (degenerative disc  disease), lumbar - MRI of the lumbar spine updated on 10/28/2022.  She underwent a facet joint injection on 12/02/2022 by Dr. Alvester Morin.  Spinal stenosis of lumbosacral region - Recent facet joint injection performed on 12/02/2022.   Chronic SI joint pain: Patient continues to have ongoing discomfort in both SI joints.  She did not notice any improvement in her symptoms while on Cimzia and does not plan to resume therapy at this time.  She is advised notify us if she develops any new or worsening symptoms.  Status post total replacement of both hips - Hx of avascular necrosis.  S/P total knee arthroplasty, left - - Dr. Blackman-12/28/21. Chronic pain and stiffness.  She is no longer using a cane.  Patient continues to go to physical therapy which has been helpful.  Other medical conditions are listed as follows:   Bipolar 1 disorder (HCC)  PTSD (post-traumatic stress disorder)  History of anxiety  B12 deficiency  Pulmonary nodules  History of COPD    Orders: Orders Placed This Encounter  Procedures   COMPLETE METABOLIC PANEL WITH GFR   CBC with Differential/Platelet   QuantiFERON-TB Gold Plus   No orders of the defined types were placed in this encounter.   Follow-Up Instructions: Return in about 3 months (around 09/10/2023).   Gearldine Bienenstock, PA-C  Note - This record has been created using Dragon software.  Chart creation errors have been sought, but may not always  have been located. Such creation errors do not reflect on  the standard of medical care.

## 2023-05-29 ENCOUNTER — Ambulatory Visit (INDEPENDENT_AMBULATORY_CARE_PROVIDER_SITE_OTHER): Payer: Medicare Other | Admitting: Physical Therapy

## 2023-05-29 ENCOUNTER — Encounter: Payer: Self-pay | Admitting: Physical Therapy

## 2023-05-29 DIAGNOSIS — G8929 Other chronic pain: Secondary | ICD-10-CM

## 2023-05-29 DIAGNOSIS — M25562 Pain in left knee: Secondary | ICD-10-CM | POA: Diagnosis not present

## 2023-05-29 DIAGNOSIS — M546 Pain in thoracic spine: Secondary | ICD-10-CM | POA: Diagnosis not present

## 2023-05-29 DIAGNOSIS — R293 Abnormal posture: Secondary | ICD-10-CM | POA: Diagnosis not present

## 2023-05-29 DIAGNOSIS — M6281 Muscle weakness (generalized): Secondary | ICD-10-CM | POA: Diagnosis not present

## 2023-05-29 DIAGNOSIS — R262 Difficulty in walking, not elsewhere classified: Secondary | ICD-10-CM

## 2023-05-29 NOTE — Therapy (Signed)
OUTPATIENT PHYSICAL THERAPY TREATMENT   Patient Name: Judy [name] MRN: [id number] DOB:08-04-1951, 72 y.o., female Today's Date: 05/29/2023  END OF SESSION:  PT End of Session - 05/29/23 1401     Visit Number 7    Number of Visits 12    Date for PT Re-Evaluation 06/11/23    Authorization Type Medicare/[employer]    Progress Note Due on Visit 10    PT Start Time 1345    PT Stop Time 1423    PT Time Calculation (min) 38 min    Activity Tolerance Patient tolerated treatment well;No increased pain    Behavior During Therapy WFL for tasks assessed/performed               Past Medical History:  Diagnosis Date   Anxiety    Arthritis    Avascular necrosis (HCC)    Left hip   Avascular necrosis of bone of hip, 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   Bipolar 1 disorder (HCC)    COPD (chronic obstructive pulmonary disease) (HCC)    Depression    Dyspnea    Pneumonia    PTSD (post-traumatic stress disorder)    Past Surgical History:  Procedure Laterality Date   bone spurs foot Right    CATARACT EXTRACTION Bilateral    COLONOSCOPY     COLONOSCOPY  07/2022   HAND SURGERY Left    JOINT REPLACEMENT     TONSILLECTOMY     TONSILLECTOMY AND ADENOIDECTOMY     TOTAL HIP ARTHROPLASTY Left 03/19/2019   Procedure: LEFT TOTAL HIP ARTHROPLASTY ANTERIOR APPROACH;  Surgeon: [name], MD;  Location: [facility];  Service: Orthopedics;  Laterality: Left;   TOTAL HIP ARTHROPLASTY Right 06/04/2019   Procedure: RIGHT TOTAL HIP ARTHROPLASTY ANTERIOR APPROACH;  Surgeon: [name], MD;  Location: [facility];  Service: Orthopedics;  Laterality: Right;   TOTAL KNEE ARTHROPLASTY Left 12/28/2021   Procedure: Left TOTAL KNEE ARTHROPLASTY;  Surgeon: [name], MD;  Location: [facility];  Service: Orthopedics;  Laterality: Left;   Patient Active Problem List   Diagnosis Date Noted   Prediabetes 08/09/2022    Restless legs syndrome 08/09/2022   Status post total left knee replacement 12/28/2021   Primary osteoarthritis of left knee 10/25/2021   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) 11/30/2020   Status post total replacement of right hip 06/04/2019   Status post total replacement of left hip 03/19/2019   PTSD (post-traumatic stress disorder)    COPD (chronic obstructive pulmonary disease) (HCC)    Bipolar 1 disorder (HCC)    Anxiety    B12 deficiency 12/09/2018   Hyperglycemia 04/14/2017    PCP: Karie Georges, MD  REFERRING PROVIDER: Juanda Chance, NP  REFERRING DIAG: [phone] (ICD-10-CM) - Myofascial pain syndrome  Rationale for Evaluation and Treatment: Rehabilitation  THERAPY DIAG:  Pain in thoracic spine  Muscle weakness (generalized)  Chronic pain of left knee  Abnormal posture  Difficulty in walking, not elsewhere classified  ONSET DATE: 2 weeks ago   SUBJECTIVE:  SUBJECTIVE STATEMENT: Pt reporting she is cracking all over, pain is not bad, 1/10 overall  PERTINENT HISTORY:  COPD, PTSD, Rt THA 8/ 2020. Left THA 03/2019, Bipolar disorder, anxiety  PAIN:  Are you having pain? Yes: NPRS scale: 1/10 Pain location: upper back and Lt knee Pain description: stabbing; weakness, hot knife and twisting in back Aggravating factors: walking Relieving factors: lying down  PRECAUTIONS:  None  RED FLAGS: None   WEIGHT BEARING RESTRICTIONS:  No  FALLS:  Has patient fallen in last 6 months? 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 and bilateral lateral recess stenosis at L3-L4, there is also severe facet arthropathy and medially oriented facet synovial cyst at this level  PATIENT SURVEYS:  04/30/23: FOTO 33 (predicted 44) 05/29/23: FOTO improved to 45% and met goal for this  SENSATION: WFL  POSTURE:  rounded shoulders and forward head  GAIT: 04/30/23:  Distance walked: 100' Assistive device utilized: None Level of assistance: Modified independence Comments: antalgic gait   BACK  PALPATION: 04/30/23: very tender to palpation along Lt rhomboids   UPPER EXTREMITY MMT:  MMT Right eval Left eval  Shoulder flexion 3/5 3/5 with pain  Shoulder extension    Shoulder abduction 3/5 3/5 with pain  Shoulder adduction    Shoulder extension    Shoulder internal rotation    Shoulder external rotation     (Blank rows = not tested)   LUMBAR ROM:   ROM A/PROM  eval  Flexion Limited  50%  Extension WNL  Right Quadrant Limited 25% with pain  Left Quadrant Limited  25%   (Blank rows = not tested)  Neck ROM:   ROM AROM  05/06/23 AROM 05/13/23  Flexion 15 deg 18  Extension 20 deg 20  Left rotation 15 deg 30  Right rotation 25 deg 35     Lower Extremity Right EVAL Left EVAL   A/PROM MMT A/PROM MMT  Hip Flexion  3/5  3/5  Hip Extension      Hip Abduction      Hip Adduction      Hip Internal rotation      Hip External rotation      Knee Flexion  4/5  4/5  Knee Extension  4/5  4/5  Ankle Dorsiflexion      Ankle Plantarflexion      Ankle Inversion      Ankle Eversion       (Blank rows = not tested)  * pain  ___________________________________________________________________________________  TODAY's TREATMENT:  ___________________________________                                               DATE: 05/29/23 TherEx: Nu step L4 X 6 min UE/LE UBE L3 X 2.5 min fwd, 2.5 min bkwd Lat pulldown machine 20# 2X10 Seated row machine 35# 2X10 Chest press machine 15# 2X10 Cervical rotation using towel roll for added stretch x 5 to each side holding 10 sec Chin isometrics 5 sec X 10 each plane FOTO update  DATE: 05/27/23 TherEx: Pulleys x 2 minutes flexion Nu step L4 X 6 min UE/LE Lat pulldown machine 20# 2X10 Chest press machine 10# 2X10 Cervical rotation using towel roll for added stretch x 5 to each side holding 10 sec Chin tuck trying to squeez 10 holding 3 sec Pball rolling up wall 5 sec X 10 for shoulder flexion  DATE: 05/15/23 TherEx: Pulleys x 2 minutes flexion Nu step L4 X 5 min UE/LE Rows: green TB 2 x 15  Shoulder extension: red TB 2 x 15  Cervical rotation using towel roll for added stretch x 5 to each side holding 10 sec Chin tuck trying to squeez 10 holding 3 sec Seated cervical isometrics sidebend, flexion, extension all 5 sec X 10 Pball rolling up wall 5 sec X 10 for shoulder flexion Pball seated lumbar flexion stretch 5 sec X 10 Modalities:  7 minutes of moist heat cervical spine and mid/low back DATE: 05/13/23 TherEx: Pulleys x 2 minutes flexion Rows: green TB 2 x 15  Shoulder extension: red TB 2 x 15  Cervical rotation using towel roll for added stretch x 5 to each side holding 10 sec Chin tuck trying to squeeze a towel roll placed under her chin. X 10 holding 3 sec Cervical side-bending; x 5 holding 10 sec Seated cervical isometrics with ball behind pt's head x 10 holding 5 sec Modalities:  7 minutes of moist heat cervical spine and mid/low back       PATIENT EDUCATION:  Education details: HEP Person educated: Patient Education method: Programmer, multimedia, Facilities manager, and Handouts Education comprehension: verbalized  understanding, returned demonstration, and needs further education  HOME EXERCISE PROGRAM: Access Code: [email or web address] URL: [email or web address] Date: 05/06/2023 Prepared by: Ivery Quale  Exercises - Standing Paraspinals Mobilization with Small Ball on Wall  - 3 x daily - 7 x weekly - 1 sets - 1 reps - 2-3 min hold - Standing Scapular Retraction  - 1 x daily - 7 x weekly - 1 sets - 10 reps - 5 sec hold - Standing Backward Shoulder Rolls  - 1 x daily - 7 x weekly - 1 sets - 10 reps - Seated Knee Extension AROM  - 1 x daily - 7 x weekly - 1-2 sets - 10 reps - Seated Straight Leg Raise  - 1 x daily - 7 x weekly - 1-2 sets - 10 reps - Sit to Stand  - 1 x daily - 7 x weekly - 1-2 sets - 10 reps - Seated Assisted Cervical Rotation with Towel  - 2 x daily - 6 x weekly - 1 sets - 10 reps - 5 hold - Seated Isometric Cervical Sidebending  - 2 x daily - 6 x weekly - 1 sets - 10 reps - 5 hold - Seated Isometric Cervical Extension  - 2 x daily - 6 x weekly - 1 sets -  10 reps - 5 hold - Seated Isometric Cervical Flexion  - 2 x daily - 6 x weekly - 1 sets - 10 reps - 6 hold - Seated Passive Cervical Retraction  - 2 x daily - 6 x weekly - 1 sets - 10 reps   ASSESSMENT:  CLINICAL IMPRESSION: She did show some subjective/objective improvements with overall function on FOTO score today. She would like to continue with PT at this time so I did encourage her to set up future appoitments.       OBJECTIVE IMPAIRMENTS: Abnormal gait, decreased balance, decreased mobility, difficulty walking, decreased ROM, decreased strength, increased fascial restrictions, increased muscle spasms, postural dysfunction, and pain.   ACTIVITY LIMITATIONS: carrying, lifting, bending, sitting, standing, squatting, sleeping, stairs, transfers, and locomotion level  PARTICIPATION LIMITATIONS: meal prep, cleaning, laundry, driving, shopping, and community activity  PERSONAL FACTORS: 3+ comorbidities: COPD, PTSD,  Rt  THA 8/ 2020. Left THA 03/2019, Bipolar disorder, anxiety  are also affecting patient's functional outcome.   REHAB POTENTIAL: Good  CLINICAL DECISION MAKING: Evolving/moderate complexity  EVALUATION COMPLEXITY: Moderate   GOALS: Goals reviewed with patient? Yes  SHORT TERM GOALS: Target date: 05/21/2023  Independent with initial HEP Goal status: MET 05/15/23  LONG TERM GOALS: Target date: 06/11/2023  Independent with final HEP Goal status: INITIAL  2.  FOTO score improved to 44 Goal status: INITIAL  3.  Bil UE shoulder strength improved to 4/5 for improved function Goal status: INIITAL  4.  Report pain < 2/10 in upper back with reaching and overhead activities for improved function Goal status: INITIAL  5. Pt will improve neck rotation ROM >35 degrees each side for improved scanning of her enviornment      PLAN:  PT FREQUENCY: 1-2x/week  PT DURATION: 6 weeks  PLANNED INTERVENTIONS: Therapeutic exercises, Therapeutic activity, Neuromuscular re-education, Balance training, Gait training, Patient/Family education, Self Care, Joint mobilization, Aquatic Therapy, Dry Needling, Electrical stimulation, Spinal manipulation, Spinal mobilization, Cryotherapy, Moist heat, Taping, Traction, Manual therapy, and Re-evaluation.  PLAN FOR NEXT SESSION: neck and back exercises, progress postural and strengthening exercises, she's unable to spend a lot of time supine or prone so most exercises need to be sitting/standing    NEXT MD VISIT: PRN  Ivery Quale, PT, DPT 05/29/23 2:25 PM

## 2023-06-07 ENCOUNTER — Encounter: Payer: Self-pay | Admitting: Physical Medicine and Rehabilitation

## 2023-06-10 ENCOUNTER — Encounter: Payer: Self-pay | Admitting: Physical Therapy

## 2023-06-10 ENCOUNTER — Ambulatory Visit (INDEPENDENT_AMBULATORY_CARE_PROVIDER_SITE_OTHER): Payer: Medicare Other | Admitting: Physical Therapy

## 2023-06-10 DIAGNOSIS — M546 Pain in thoracic spine: Secondary | ICD-10-CM

## 2023-06-10 DIAGNOSIS — G8929 Other chronic pain: Secondary | ICD-10-CM

## 2023-06-10 DIAGNOSIS — M25562 Pain in left knee: Secondary | ICD-10-CM | POA: Diagnosis not present

## 2023-06-10 DIAGNOSIS — M6281 Muscle weakness (generalized): Secondary | ICD-10-CM

## 2023-06-10 DIAGNOSIS — R262 Difficulty in walking, not elsewhere classified: Secondary | ICD-10-CM

## 2023-06-10 DIAGNOSIS — R293 Abnormal posture: Secondary | ICD-10-CM | POA: Diagnosis not present

## 2023-06-10 NOTE — Therapy (Signed)
OUTPATIENT PHYSICAL THERAPY TREATMENT   Patient Name: Judy [name] MRN: [id number] DOB:11-06-1950, 72 y.o., female Today's Date: 06/10/2023  END OF SESSION:  PT End of Session - 06/10/23 1447     Visit Number 8    Number of Visits 12    Date for PT Re-Evaluation 06/11/23    Authorization Type Medicare/[employer]    Progress Note Due on Visit 10    PT Start Time 1430    PT Stop Time 1510    PT Time Calculation (min) 40 min    Activity Tolerance Patient tolerated treatment well;No increased pain    Behavior During Therapy WFL for tasks assessed/performed                Past Medical History:  Diagnosis Date   Anxiety    Arthritis    Avascular necrosis (HCC)    Left hip   Avascular necrosis of bone of hip, 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   Bipolar 1 disorder (HCC)    COPD (chronic obstructive pulmonary disease) (HCC)    Depression    Dyspnea    Pneumonia    PTSD (post-traumatic stress disorder)    Past Surgical History:  Procedure Laterality Date   bone spurs foot Right    CATARACT EXTRACTION Bilateral    COLONOSCOPY     COLONOSCOPY  07/2022   HAND SURGERY Left    JOINT REPLACEMENT     TONSILLECTOMY     TONSILLECTOMY AND ADENOIDECTOMY     TOTAL HIP ARTHROPLASTY Left 03/19/2019   Procedure: LEFT TOTAL HIP ARTHROPLASTY ANTERIOR APPROACH;  Surgeon: [name], MD;  Location: [facility];  Service: Orthopedics;  Laterality: Left;   TOTAL HIP ARTHROPLASTY Right 06/04/2019   Procedure: RIGHT TOTAL HIP ARTHROPLASTY ANTERIOR APPROACH;  Surgeon: [name], MD;  Location: [facility];  Service: Orthopedics;  Laterality: Right;   TOTAL KNEE ARTHROPLASTY Left 12/28/2021   Procedure: Left TOTAL KNEE ARTHROPLASTY;  Surgeon: [name], MD;  Location: [facility];  Service: Orthopedics;  Laterality: Left;   Patient Active Problem List   Diagnosis Date Noted   Prediabetes 08/09/2022    Restless legs syndrome 08/09/2022   Status post total left knee replacement 12/28/2021   Primary osteoarthritis of left knee 10/25/2021   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) 11/30/2020   Status post total replacement of right hip 06/04/2019   Status post total replacement of left hip 03/19/2019   PTSD (post-traumatic stress disorder)    COPD (chronic obstructive pulmonary disease) (HCC)    Bipolar 1 disorder (HCC)    Anxiety    B12 deficiency 12/09/2018   Hyperglycemia 04/14/2017    PCP: Karie Georges, MD  REFERRING PROVIDER: Juanda Chance, NP  REFERRING DIAG: [phone] (ICD-10-CM) - Myofascial pain syndrome  Rationale for Evaluation and Treatment: Rehabilitation  THERAPY DIAG:  Pain in thoracic spine  Muscle weakness (generalized)  Chronic pain of left knee  Abnormal posture  Difficulty in walking, not elsewhere classified  ONSET DATE: 2 weeks ago   SUBJECTIVE:  SUBJECTIVE STATEMENT:pain is 1-2/10 overall, enough to be annoying. She has been going to the gym to exercise.   PERTINENT HISTORY:  COPD, PTSD, Rt THA 8/ 2020. Left THA 03/2019, Bipolar disorder, anxiety  PAIN:  Are you having pain? Yes: NPRS scale: 1-2/10 Pain location: upper back and Lt knee Pain description: stabbing; weakness, hot knife and twisting in back Aggravating factors: walking Relieving factors: lying down  PRECAUTIONS:  None  RED FLAGS: None   WEIGHT BEARING RESTRICTIONS:  No  FALLS:  Has patient fallen in last 6 months? No  LIVING ENVIRONMENT: Lives with: lives alone Lives in: House/apartment Stairs: Yes: External: 4 steps; can reach both  OCCUPATION:  retired  PLOF:  Independent and Leisure: watch TV/election results  PATIENT GOALS:  Feel better before heading  to beach in 2 week   OBJECTIVE: (objective measures completed at initial evaluation unless otherwise dated)   DIAGNOSTIC FINDINGS:  Lumbar MRI imaging from January of this year exhibits mild to moderate spinal canal stenosis and bilateral lateral recess stenosis at L3-L4, there is also severe facet arthropathy and medially oriented facet synovial cyst at this level  PATIENT SURVEYS:  04/30/23: FOTO 33 (predicted 44) 05/29/23: FOTO improved to 45% and met goal for this  SENSATION: WFL  POSTURE:  rounded shoulders and forward head  GAIT: 04/30/23:  Distance walked: 100' Assistive device utilized: None Level of assistance: Modified independence Comments: antalgic gait   BACK  PALPATION: 04/30/23: very tender to palpation along Lt rhomboids   UPPER EXTREMITY MMT:  MMT Right eval Left eval  Shoulder flexion 3/5 3/5 with pain  Shoulder extension    Shoulder abduction 3/5 3/5 with pain  Shoulder adduction    Shoulder extension    Shoulder internal rotation    Shoulder external rotation     (Blank rows = not tested)   LUMBAR ROM:   ROM A/PROM  eval  Flexion Limited  50%  Extension WNL  Right Quadrant Limited 25% with pain  Left Quadrant Limited  25%   (Blank rows = not tested)  Neck ROM:   ROM AROM  05/06/23 AROM 05/13/23  Flexion 15 deg 18  Extension 20 deg 20  Left rotation 15 deg 30  Right rotation 25 deg 35     Lower Extremity Right EVAL Left EVAL   A/PROM MMT A/PROM MMT  Hip Flexion  3/5  3/5  Hip Extension      Hip Abduction      Hip Adduction      Hip Internal rotation      Hip External rotation      Knee Flexion  4/5  4/5  Knee Extension  4/5  4/5  Ankle Dorsiflexion      Ankle Plantarflexion      Ankle Inversion      Ankle Eversion       (Blank rows = not tested)  * pain  ___________________________________________________________________________________  TODAY's TREATMENT:  ___________________________________                                               DATE: 06/10/23 TherEx: Nu step L5 X 6 min UE/LE UBE L3 X 3 min fwd, 3 min bkwd Lat pulldown machine 20# 2X10 Seated row machine 35# 2X12 Chest press machine 15# 2X10 Shoulder extensions green 2X12 Leg press machine 100# 2X12 Cervical rotation using towel roll for added stretch x 5 to each side holding 10 sec Cervical isometrics 5 sec X 10 each plane  DATE: 05/29/23 TherEx: Nu step L4 X 6 min UE/LE UBE L3 X 2.5 min fwd, 2.5 min bkwd Lat pulldown machine 20# 2X10 Seated row machine 35# 2X10 Chest press machine 15# 2X10 Cervical rotation using towel roll for added stretch x 5 to each side holding 10 sec Chin isometrics 5 sec X 10 each plane FOTO update  DATE: 05/27/23 TherEx: Pulleys x 2 minutes flexion Nu step L4 X 6 min UE/LE Lat pulldown machine 20# 2X10 Chest press machine 10# 2X10 Cervical rotation using towel roll for added stretch x 5 to each side holding 10 sec Chin tuck trying to squeez 10 holding 3 sec Pball rolling up wall 5 sec X 10 for shoulder flexion  DATE: 05/15/23 TherEx: Pulleys x 2 minutes flexion Nu step L4 X 5 min UE/LE Rows: green TB 2 x 15  Shoulder extension: red TB 2 x 15  Cervical rotation using towel roll for added stretch x 5 to each side holding 10 sec Chin tuck trying to squeez 10 holding 3 sec Seated cervical isometrics sidebend, flexion, extension all 5 sec X 10 Pball rolling up wall 5 sec X 10 for shoulder flexion Pball seated lumbar flexion stretch 5 sec X 10 Modalities:  7 minutes of moist heat cervical spine and mid/low back DATE: 05/13/23 TherEx: Pulleys x 2 minutes flexion Rows: green TB 2 x 15  Shoulder extension: red TB 2 x 15  Cervical rotation using towel roll for added stretch x 5 to each side holding 10 sec Chin tuck trying to squeeze a towel roll placed under her chin. X 10 holding 3  sec Cervical side-bending; x 5 holding 10 sec Seated cervical isometrics with ball behind pt's head x 10 holding 5 sec Modalities:  7 minutes of moist heat cervical spine and mid/low back       PATIENT EDUCATION:  Education details: HEP Person educated: Patient Education method: Programmer, multimedia, Facilities manager, and Handouts Education comprehension: verbalized understanding, returned demonstration, and needs further education  HOME EXERCISE PROGRAM: Access Code: [email or web address] URL: [email or web address] Date: 05/06/2023 Prepared by: Ivery Quale  Exercises - Standing Paraspinals Mobilization with Small Ball on Wall  - 3 x daily - 7 x weekly - 1 sets - 1 reps - 2-3 min hold - Standing Scapular Retraction  - 1 x daily - 7 x weekly - 1 sets - 10 reps - 5 sec hold - Standing Backward Shoulder Rolls  - 1 x daily - 7 x weekly - 1 sets - 10 reps - Seated Knee Extension AROM  - 1 x daily - 7 x weekly - 1-2 sets - 10 reps - Seated Straight Leg Raise  - 1 x daily - 7 x weekly - 1-2 sets - 10 reps - Sit to Stand  - 1 x daily - 7 x weekly - 1-2 sets -  10 reps - Seated Assisted Cervical Rotation with Towel  - 2 x daily - 6 x weekly - 1 sets - 10 reps - 5 hold - Seated Isometric Cervical Sidebending  - 2 x daily - 6 x weekly - 1 sets - 10 reps - 5 hold - Seated Isometric Cervical Extension  - 2 x daily - 6 x weekly - 1 sets - 10 reps - 5 hold - Seated Isometric Cervical Flexion  - 2 x daily - 6 x weekly - 1 sets - 10 reps - 6 hold - Seated Passive Cervical Retraction  - 2 x daily - 6 x weekly - 1 sets - 10 reps   ASSESSMENT:  CLINICAL IMPRESSION: She had good overall tolerance to exercise session focusing on improving overall strength and ROM. We will update measurements and goals next visit for recert.       OBJECTIVE IMPAIRMENTS: Abnormal gait, decreased balance, decreased mobility, difficulty walking, decreased ROM, decreased strength, increased fascial restrictions, increased  muscle spasms, postural dysfunction, and pain.   ACTIVITY LIMITATIONS: carrying, lifting, bending, sitting, standing, squatting, sleeping, stairs, transfers, and locomotion level  PARTICIPATION LIMITATIONS: meal prep, cleaning, laundry, driving, shopping, and community activity  PERSONAL FACTORS: 3+ comorbidities: COPD, PTSD,  Rt THA 8/ 2020. Left THA 03/2019, Bipolar disorder, anxiety  are also affecting patient's functional outcome.   REHAB POTENTIAL: Good  CLINICAL DECISION MAKING: Evolving/moderate complexity  EVALUATION COMPLEXITY: Moderate   GOALS: Goals reviewed with patient? Yes  SHORT TERM GOALS: Target date: 05/21/2023  Independent with initial HEP Goal status: MET 05/15/23  LONG TERM GOALS: Target date: 06/11/2023  Independent with final HEP Goal status: INITIAL  2.  FOTO score improved to 44 Goal status: INITIAL  3.  Bil UE shoulder strength improved to 4/5 for improved function Goal status: INIITAL  4.  Report pain < 2/10 in upper back with reaching and overhead activities for improved function Goal status: INITIAL  5. Pt will improve neck rotation ROM >35 degrees each side for improved scanning of her enviornment      PLAN:  PT FREQUENCY: 1-2x/week  PT DURATION: 6 weeks  PLANNED INTERVENTIONS: Therapeutic exercises, Therapeutic activity, Neuromuscular re-education, Balance training, Gait training, Patient/Family education, Self Care, Joint mobilization, Aquatic Therapy, Dry Needling, Electrical stimulation, Spinal manipulation, Spinal mobilization, Cryotherapy, Moist heat, Taping, Traction, Manual therapy, and Re-evaluation.  PLAN FOR NEXT SESSION:  Needs recert!!!!!!! neck and back exercises, progress postural and strengthening exercises, she's unable to spend a lot of time supine or prone so most exercises need to be sitting/standing    NEXT MD VISIT: PRN  Ivery Quale, PT, DPT 06/10/23 2:48 PM

## 2023-06-11 ENCOUNTER — Ambulatory Visit: Payer: Medicare Other | Admitting: Rheumatology

## 2023-06-11 ENCOUNTER — Ambulatory Visit: Payer: Medicare Other | Attending: Rheumatology | Admitting: Physician Assistant

## 2023-06-11 ENCOUNTER — Encounter: Payer: Self-pay | Admitting: Physician Assistant

## 2023-06-11 VITALS — BP 122/79 | HR 67 | Resp 12 | Ht 70.0 in | Wt 241.0 lb

## 2023-06-11 DIAGNOSIS — M24522 Contracture, left elbow: Secondary | ICD-10-CM | POA: Insufficient documentation

## 2023-06-11 DIAGNOSIS — Z96652 Presence of left artificial knee joint: Secondary | ICD-10-CM

## 2023-06-11 DIAGNOSIS — F319 Bipolar disorder, unspecified: Secondary | ICD-10-CM

## 2023-06-11 DIAGNOSIS — Z8709 Personal history of other diseases of the respiratory system: Secondary | ICD-10-CM | POA: Diagnosis present

## 2023-06-11 DIAGNOSIS — F4310 Post-traumatic stress disorder, unspecified: Secondary | ICD-10-CM | POA: Diagnosis present

## 2023-06-11 DIAGNOSIS — M79642 Pain in left hand: Secondary | ICD-10-CM | POA: Insufficient documentation

## 2023-06-11 DIAGNOSIS — M5136 Other intervertebral disc degeneration, lumbar region: Secondary | ICD-10-CM | POA: Insufficient documentation

## 2023-06-11 DIAGNOSIS — Z111 Encounter for screening for respiratory tuberculosis: Secondary | ICD-10-CM | POA: Diagnosis present

## 2023-06-11 DIAGNOSIS — Z79899 Other long term (current) drug therapy: Secondary | ICD-10-CM | POA: Diagnosis present

## 2023-06-11 DIAGNOSIS — Z96643 Presence of artificial hip joint, bilateral: Secondary | ICD-10-CM | POA: Diagnosis present

## 2023-06-11 DIAGNOSIS — M4807 Spinal stenosis, lumbosacral region: Secondary | ICD-10-CM

## 2023-06-11 DIAGNOSIS — M51369 Other intervertebral disc degeneration, lumbar region without mention of lumbar back pain or lower extremity pain: Secondary | ICD-10-CM

## 2023-06-11 DIAGNOSIS — Z8659 Personal history of other mental and behavioral disorders: Secondary | ICD-10-CM | POA: Diagnosis present

## 2023-06-11 DIAGNOSIS — R918 Other nonspecific abnormal finding of lung field: Secondary | ICD-10-CM | POA: Diagnosis present

## 2023-06-11 DIAGNOSIS — M79641 Pain in right hand: Secondary | ICD-10-CM

## 2023-06-11 DIAGNOSIS — M24521 Contracture, right elbow: Secondary | ICD-10-CM | POA: Diagnosis present

## 2023-06-11 DIAGNOSIS — M45 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: Secondary | ICD-10-CM

## 2023-06-11 DIAGNOSIS — G8929 Other chronic pain: Secondary | ICD-10-CM | POA: Diagnosis present

## 2023-06-11 DIAGNOSIS — Z87891 Personal history of nicotine dependence: Secondary | ICD-10-CM

## 2023-06-11 DIAGNOSIS — M533 Sacrococcygeal disorders, not elsewhere classified: Secondary | ICD-10-CM | POA: Insufficient documentation

## 2023-06-11 DIAGNOSIS — F17200 Nicotine dependence, unspecified, uncomplicated: Secondary | ICD-10-CM

## 2023-06-11 DIAGNOSIS — E538 Deficiency of other specified B group vitamins: Secondary | ICD-10-CM | POA: Diagnosis present

## 2023-06-12 ENCOUNTER — Encounter: Payer: Medicare Other | Admitting: Rehabilitative and Restorative Service Providers"

## 2023-06-13 ENCOUNTER — Encounter: Payer: Self-pay | Admitting: Rehabilitative and Restorative Service Providers"

## 2023-06-13 ENCOUNTER — Other Ambulatory Visit: Payer: Self-pay | Admitting: Family Medicine

## 2023-06-13 ENCOUNTER — Ambulatory Visit (INDEPENDENT_AMBULATORY_CARE_PROVIDER_SITE_OTHER): Payer: Medicare Other | Admitting: Rehabilitative and Restorative Service Providers"

## 2023-06-13 DIAGNOSIS — G8929 Other chronic pain: Secondary | ICD-10-CM

## 2023-06-13 DIAGNOSIS — M6281 Muscle weakness (generalized): Secondary | ICD-10-CM

## 2023-06-13 DIAGNOSIS — M25562 Pain in left knee: Secondary | ICD-10-CM | POA: Diagnosis not present

## 2023-06-13 DIAGNOSIS — R293 Abnormal posture: Secondary | ICD-10-CM

## 2023-06-13 DIAGNOSIS — G2581 Restless legs syndrome: Secondary | ICD-10-CM

## 2023-06-13 DIAGNOSIS — R262 Difficulty in walking, not elsewhere classified: Secondary | ICD-10-CM

## 2023-06-13 DIAGNOSIS — M546 Pain in thoracic spine: Secondary | ICD-10-CM

## 2023-06-13 NOTE — Therapy (Signed)
OUTPATIENT PHYSICAL THERAPY TREATMENT / RECERT/ PROGRESS NOTE   Patient Name: Judy [name] MRN: [id number] DOB:1951/02/06, 72 y.o., female Today's Date: 06/13/2023  Progress Note Reporting Period 04/30/2023 to 06/13/2023  See note below for Objective Data and Assessment of Progress/Goals.      END OF SESSION:  PT End of Session - 06/13/23 1103     Visit Number 9    Number of Visits 20    Date for PT Re-Evaluation 07/25/23    Authorization Type Medicare/[employer]    Progress Note Due on Visit 20    PT Start Time 1059    PT Stop Time 1139    PT Time Calculation (min) 40 min    Activity Tolerance Patient limited by fatigue    Behavior During Therapy WFL for tasks assessed/performed                 Past Medical History:  Diagnosis Date   Anxiety    Arthritis    Avascular necrosis (HCC)    Left hip   Avascular necrosis of bone of hip, 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   Bipolar 1 disorder (HCC)    COPD (chronic obstructive pulmonary disease) (HCC)    Depression    Dyspnea    Pneumonia    PTSD (post-traumatic stress disorder)    Past Surgical History:  Procedure Laterality Date   bone spurs foot Right    CATARACT EXTRACTION Bilateral    COLONOSCOPY     COLONOSCOPY  07/2022   HAND SURGERY Left    JOINT REPLACEMENT     TONSILLECTOMY     TONSILLECTOMY AND ADENOIDECTOMY     TOTAL HIP ARTHROPLASTY Left 03/19/2019   Procedure: LEFT TOTAL HIP ARTHROPLASTY ANTERIOR APPROACH;  Surgeon: [name], MD;  Location: [facility];  Service: Orthopedics;  Laterality: Left;   TOTAL HIP ARTHROPLASTY Right 06/04/2019   Procedure: RIGHT TOTAL HIP ARTHROPLASTY ANTERIOR APPROACH;  Surgeon: [name], MD;  Location: [facility];  Service: Orthopedics;  Laterality: Right;   TOTAL KNEE ARTHROPLASTY Left 12/28/2021   Procedure: Left TOTAL KNEE ARTHROPLASTY;  Surgeon: [name], MD;  Location:  [facility];  Service: Orthopedics;  Laterality: Left;   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   Status post total replacement of left hip 03/19/2019   PTSD (post-traumatic stress disorder)    COPD (chronic obstructive pulmonary disease) (HCC)    Bipolar 1 disorder (HCC)    Anxiety    B12 deficiency 12/09/2018   Hyperglycemia 04/14/2017    PCP: Karie Georges, MD  REFERRING PROVIDER: Juanda Chance, NP  REFERRING DIAG: [phone] (ICD-10-CM) - Myofascial pain syndrome  Rationale for Evaluation and Treatment: Rehabilitation  THERAPY DIAG:  Pain in thoracic spine  Muscle weakness (generalized)  Chronic pain of left knee  Abnormal posture  Difficulty in walking, not elsewhere classified  ONSET DATE: 2 weeks ago   SUBJECTIVE:  SUBJECTIVE STATEMENT: Pt indicated not having a lot of pain back , maybe 1/10.  Pt indicated knee similar at 1/10.   Pt reported neck loosening up as well.  Reported walking distances still troublesome with weakness in legs with prolonged walking.  GROC +5 at this time.     PERTINENT HISTORY:  COPD, PTSD, Rt THA 8/ 2020. Left THA 03/2019, Bipolar disorder, anxiety  PAIN:  NPRS scale: 1/10 Pain location: upper back and Lt knee Pain description: stabbing; weakness, hot knife and twisting in back Aggravating factors: walking Relieving factors: lying down  PRECAUTIONS:  None  RED FLAGS: None   WEIGHT BEARING RESTRICTIONS:  No  FALLS:  Has patient fallen in  last 6 months? 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 and bilateral lateral recess stenosis at L3-L4, there is also severe facet arthropathy and medially oriented facet synovial cyst at this level  PATIENT SURVEYS:  05/29/23: FOTO improved to 45% and met goal for this  04/30/23: FOTO 33 (predicted 44)   SENSATION: WFL  POSTURE:  04/30/2023 rounded shoulders and forward head  GAIT: 06/13/2023:  independent   04/30/23:  Distance walked: 100' Assistive device utilized: None Level of assistance: Modified independence Comments: antalgic gait   BACK  PALPATION: 04/30/23: very tender to palpation along Lt rhomboids   UPPER EXTREMITY MMT:  MMT Right eval Left eval Right 06/13/2023 Left 06/13/2023  Shoulder flexion 3/5 3/5 with pain 4+/5 4+/5  Shoulder extension      Shoulder abduction 3/5 3/5 with pain 4+/5 4+/5  Shoulder adduction      Shoulder extension      Shoulder internal rotation   5/5 5/5  Shoulder external rotation   5/5 5/5   (Blank rows = not tested)   LUMBAR ROM:   ROM A/PROM  eval AROM 06/13/2023  Flexion Limited  50%   Extension WNL 75% WFL   Repeated x5 improved to 100 % WFL  Right Quadrant Limited 25% with pain   Left Quadrant Limited  25%    (Blank rows = not tested)  Neck ROM:   ROM AROM  05/06/23 AROM 05/13/23 AROM 06/13/2023  Flexion 15 deg 18 28  Extension 20 deg 20 30  Left rotation 15 deg 30 30  Right rotation 25 deg 35 50     Lower Extremity Right EVAL Left EVAL Right 06/13/2023 Left 06/13/2023    A/PROM MMT A/PROM MMT MMT MMT  Hip Flexion  3/5  3/5 5/5 5/5  Hip Extension        Hip Abduction        Hip Adduction        Hip Internal rotation        Hip External rotation        Knee Flexion  4/5  4/5 5/5 5/5  Knee Extension  4/5  4/5 5/5 5/5  Ankle Dorsiflexion        Ankle Plantarflexion        Ankle Inversion        Ankle Eversion         (Blank rows =  not tested)  * pain  ___________________________________________________________________________________  TODAY's TREATMENT:                                                             DATE: 06/13/2023 TherEx: Lumbar extension AROM x 5  Nustep lvl 5 10 mins UE/LE  (cues for progression time on machine at gym to progress endurance) Leg press double leg 100 lbs 2 x 15  Standing bilateral shoulder rows c scap retraction green band 2 x 15  Standing bilateral shoulder GH ext green band 2 x 15  ROM and strength testing as documented above.     TODAY's TREATMENT:                                                             DATE: 06/10/23 TherEx: Nu step L5 X 6 min UE/LE UBE L3 X 3 min fwd, 3 min bkwd Lat pulldown machine 20# 2X10 Seated row machine 35# 2X12 Chest press machine 15# 2X10 Shoulder extensions green 2X12 Leg press machine 100# 2X12 Cervical rotation using towel roll for added stretch x 5 to each side holding 10 sec Cervical isometrics 5 sec X 10 each plane  TODAY's TREATMENT:                                                             DATE: 05/29/23 TherEx: Nu step L4 X 6 min UE/LE UBE L3 X 2.5 min fwd, 2.5 min bkwd Lat pulldown machine 20# 2X10 Seated row machine 35# 2X10 Chest press machine 15# 2X10 Cervical rotation using towel roll for added stretch x 5 to each side holding 10 sec Chin isometrics 5 sec X 10 each plane FOTO update   PATIENT EDUCATION:  Eval:  Education details: HEP Person educated: Patient Education method: Programmer, multimedia, Facilities manager, and Handouts Education comprehension: verbalized  understanding, returned demonstration, and needs further education  HOME EXERCISE PROGRAM: Access Code: [email or web address] URL: [email or web address] Date: 05/06/2023 Prepared by: Ivery Quale  Exercises - Standing Paraspinals Mobilization with Small Ball on Wall  - 3 x daily - 7 x weekly - 1 sets - 1 reps - 2-3 min hold - Standing Scapular Retraction  - 1 x daily - 7 x weekly - 1 sets - 10 reps - 5 sec hold - Standing Backward Shoulder Rolls  - 1 x daily - 7 x weekly - 1 sets - 10 reps - Seated Knee Extension AROM  - 1 x daily - 7 x weekly - 1-2 sets - 10 reps - Seated Straight Leg Raise  - 1 x daily - 7 x weekly - 1-2 sets - 10 reps - Sit to Stand  - 1 x daily - 7 x weekly - 1-2 sets - 10 reps - Seated Assisted Cervical Rotation with Towel  - 2 x daily - 6 x weekly - 1 sets - 10 reps - 5 hold - Seated Isometric Cervical  Sidebending  - 2 x daily - 6 x weekly - 1 sets - 10 reps - 5 hold - Seated Isometric Cervical Extension  - 2 x daily - 6 x weekly - 1 sets - 10 reps - 5 hold - Seated Isometric Cervical Flexion  - 2 x daily - 6 x weekly - 1 sets - 10 reps - 6 hold - Seated Passive Cervical Retraction  - 2 x daily - 6 x weekly - 1 sets - 10 reps   ASSESSMENT:  CLINICAL IMPRESSION: The patient has attended 9 visits over the course of treatment cycle.  Patient has reported overall improvement at 70% with global rating of change at +5.  See objective data above for updated information regarding current presentation.  Pt has demonstrated some improvements in multiple areas but still presented with impairments that can limit daily activity ability.  Pt appropriate for continued skilled PT services to continue progression to improve ambulation, daily movement tolerance.       OBJECTIVE IMPAIRMENTS: Abnormal gait, decreased balance, decreased mobility, difficulty walking, decreased ROM, decreased strength, increased fascial restrictions, increased muscle spasms, postural dysfunction, and  pain.   ACTIVITY LIMITATIONS: carrying, lifting, bending, sitting, standing, squatting, sleeping, stairs, transfers, and locomotion level  PARTICIPATION LIMITATIONS: meal prep, cleaning, laundry, driving, shopping, and community activity  PERSONAL FACTORS: 3+ comorbidities: COPD, PTSD,  Rt THA 8/ 2020. Left THA 03/2019, Bipolar disorder, anxiety  are also affecting patient's functional outcome.   REHAB POTENTIAL: Good  CLINICAL DECISION MAKING: Evolving/moderate complexity  EVALUATION COMPLEXITY: Moderate   GOALS: Goals reviewed with patient? Yes  SHORT TERM GOALS: Target date: 05/21/2023  Independent with initial HEP Goal status: MET 05/15/23  LONG TERM GOALS: Target date: 07/25/2023    Independent with final HEP Goal status: revised  2.  FOTO score improved to 44 Goal status: revised  3.  Bil UE shoulder strength improved to 5/5 for improved function Goal status: revised  4.  Report pain < 2/10 in upper back with reaching and overhead activities for improved function Goal status: revised  5. Pt will improve neck rotation ROM >35 degrees each side for improved scanning of her enviornment Goal status: revised  6. Pt will demonstrate/report ability to walk 10-15 mins for functional walking.   Goal status: new   PLAN:  PT FREQUENCY: 1-2x/week  PT DURATION: 6 weeks  PLANNED INTERVENTIONS: Therapeutic exercises, Therapeutic activity, Neuromuscular re-education, Balance training, Gait training, Patient/Family education, Self Care, Joint mobilization, Aquatic Therapy, Dry Needling, Electrical stimulation, Spinal manipulation, Spinal mobilization, Cryotherapy, Moist heat, Taping, Traction, Manual therapy, and Re-evaluation.  PLAN FOR NEXT SESSION:  Extended POC for 6 more weeks, 1-2x/week.   Progressive walking duration improvements, LE strengthening.  General endurance gains.   NEXT MD VISIT: PRN   Chyrel Masson, PT, DPT, OCS, ATC 06/13/23  11:37 AM

## 2023-06-17 ENCOUNTER — Ambulatory Visit (INDEPENDENT_AMBULATORY_CARE_PROVIDER_SITE_OTHER): Payer: Medicare Other | Admitting: Physical Therapy

## 2023-06-17 ENCOUNTER — Encounter: Payer: Medicare Other | Admitting: Physical Therapy

## 2023-06-17 ENCOUNTER — Encounter: Payer: Self-pay | Admitting: Physical Therapy

## 2023-06-17 DIAGNOSIS — R262 Difficulty in walking, not elsewhere classified: Secondary | ICD-10-CM

## 2023-06-17 DIAGNOSIS — G8929 Other chronic pain: Secondary | ICD-10-CM

## 2023-06-17 DIAGNOSIS — M6281 Muscle weakness (generalized): Secondary | ICD-10-CM | POA: Diagnosis not present

## 2023-06-17 DIAGNOSIS — M25562 Pain in left knee: Secondary | ICD-10-CM

## 2023-06-17 DIAGNOSIS — R293 Abnormal posture: Secondary | ICD-10-CM

## 2023-06-17 DIAGNOSIS — M546 Pain in thoracic spine: Secondary | ICD-10-CM

## 2023-06-17 NOTE — Therapy (Signed)
OUTPATIENT PHYSICAL THERAPY TREATMENT / RECERT/ PROGRESS NOTE   Patient Name: Judy [name] MRN: [id number] DOB:28-Oct-1950, 72 y.o., female Today's Date: 06/17/2023  END OF SESSION:  PT End of Session - 06/17/23 1445     Visit Number 10    Number of Visits 20    Date for PT Re-Evaluation 07/25/23    Authorization Type Medicare/[employer]    Progress Note Due on Visit 19    PT Start Time 1430    PT Stop Time 1510    PT Time Calculation (min) 40 min    Activity Tolerance Patient limited by fatigue    Behavior During Therapy WFL for tasks assessed/performed                 Past Medical History:  Diagnosis Date   Anxiety    Arthritis    Avascular necrosis (HCC)    Left hip   Avascular necrosis of bone of hip, 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   Bipolar 1 disorder (HCC)    COPD (chronic obstructive pulmonary disease) (HCC)    Depression    Dyspnea    Pneumonia    PTSD (post-traumatic stress disorder)    Past Surgical History:  Procedure Laterality Date   bone spurs foot Right    CATARACT EXTRACTION Bilateral    COLONOSCOPY     COLONOSCOPY  07/2022   HAND SURGERY Left    JOINT REPLACEMENT     TONSILLECTOMY     TONSILLECTOMY AND ADENOIDECTOMY     TOTAL HIP ARTHROPLASTY Left 03/19/2019   Procedure: LEFT TOTAL HIP ARTHROPLASTY ANTERIOR APPROACH;  Surgeon: [name], MD;  Location: [facility];  Service: Orthopedics;  Laterality: Left;   TOTAL HIP ARTHROPLASTY Right 06/04/2019   Procedure: RIGHT TOTAL HIP ARTHROPLASTY ANTERIOR APPROACH;  Surgeon: [name], MD;  Location: [facility];  Service: Orthopedics;  Laterality: Right;   TOTAL KNEE ARTHROPLASTY Left 12/28/2021   Procedure: Left TOTAL KNEE ARTHROPLASTY;  Surgeon: [name], MD;  Location: [facility];  Service: Orthopedics;  Laterality: Left;   Patient Active Problem List   Diagnosis Date Noted   Prediabetes 08/09/2022    Restless legs syndrome 08/09/2022   Status post total left knee replacement 12/28/2021   Primary osteoarthritis of left knee 10/25/2021   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) 11/30/2020   Status post total replacement of right hip 06/04/2019   Status post total replacement of left hip 03/19/2019   PTSD (post-traumatic stress disorder)    COPD (chronic obstructive pulmonary disease) (HCC)    Bipolar 1 disorder (HCC)    Anxiety    B12 deficiency 12/09/2018   Hyperglycemia 04/14/2017    PCP: Karie Georges, MD  REFERRING PROVIDER: Juanda Chance, NP  REFERRING DIAG: [phone] (ICD-10-CM) - Myofascial pain syndrome  Rationale for Evaluation and Treatment: Rehabilitation  THERAPY DIAG:  Pain in thoracic spine  Muscle weakness (generalized)  Chronic pain of left knee  Abnormal posture  Difficulty in walking, not elsewhere classified  ONSET DATE: 2 weeks ago   SUBJECTIVE:  SUBJECTIVE STATEMENT: Pt reported her knees are really bothering her today 5/10 pain and swelling. She was prescribed voltaren but says it has not helped and maybe even making it worse    PERTINENT HISTORY:  COPD, PTSD, Rt THA 8/ 2020. Left THA 03/2019, Bipolar disorder, anxiety  PAIN:  NPRS scale: 5/10 Pain location: knees today Pain description: stabbing; weakness, hot knife and twisting in back Aggravating factors: walking Relieving factors: lying down  PRECAUTIONS:  None  RED FLAGS: None   WEIGHT BEARING RESTRICTIONS:  No  FALLS:  Has patient fallen in last 6 months? No  LIVING ENVIRONMENT: Lives with: lives alone Lives in: House/apartment Stairs: Yes: External: 4 steps; can reach both  OCCUPATION:  retired  PLOF:  Independent and Leisure: watch TV/election  results  PATIENT GOALS:  Feel better before heading to beach in 2 week   OBJECTIVE: (objective measures completed at initial evaluation unless otherwise dated)   DIAGNOSTIC FINDINGS:  Lumbar MRI imaging from January of this year exhibits mild to moderate spinal canal stenosis and bilateral lateral recess stenosis at L3-L4, there is also severe facet arthropathy and medially oriented facet synovial cyst at this level  PATIENT SURVEYS:  05/29/23: FOTO improved to 45% and met goal for this  04/30/23: FOTO 33 (predicted 44)   SENSATION: WFL  POSTURE:  04/30/2023 rounded shoulders and forward head  GAIT: 06/13/2023:  independent   04/30/23:  Distance walked: 100' Assistive device utilized: None Level of assistance: Modified independence Comments: antalgic gait   BACK  PALPATION: 04/30/23: very tender to palpation along Lt rhomboids   UPPER EXTREMITY MMT:  MMT Right eval Left eval Right 06/13/2023 Left 06/13/2023  Shoulder flexion 3/5 3/5 with pain 4+/5 4+/5  Shoulder extension      Shoulder abduction 3/5 3/5 with pain 4+/5 4+/5  Shoulder adduction      Shoulder extension      Shoulder internal rotation   5/5 5/5  Shoulder external rotation   5/5 5/5   (Blank rows = not tested)   LUMBAR ROM:   ROM A/PROM  eval AROM 06/13/2023  Flexion Limited  50%   Extension WNL 75% WFL   Repeated x5 improved to 100 % WFL  Right Quadrant Limited 25% with pain   Left Quadrant Limited  25%    (Blank rows = not tested)  Neck ROM:   ROM AROM  05/06/23 AROM 05/13/23 AROM 06/13/2023  Flexion 15 deg 18 28  Extension 20 deg 20 30  Left rotation 15 deg 30 30  Right rotation 25 deg 35 50     Lower Extremity Right EVAL Left EVAL Right 06/13/2023 Left 06/13/2023   A/PROM MMT A/PROM MMT MMT MMT  Hip Flexion  3/5  3/5 5/5 5/5  Hip Extension        Hip Abduction        Hip Adduction        Hip Internal rotation        Hip External rotation        Knee Flexion  4/5  4/5 5/5 5/5   Knee Extension  4/5  4/5 5/5 5/5  Ankle Dorsiflexion        Ankle Plantarflexion        Ankle Inversion        Ankle Eversion         (Blank rows = not tested)  * pain  ___________________________________________________________________________________  TODAY's TREATMENT:  DATE:  06/17/2023 TherEx: Lumbar extension AROM x 5  Nustep lvl 5 8 mins UE/LE  Pball rolling up wall for shoulder flexion and thoracic extension 5 sec X 12 reps Leg press double leg 100 lbs 2 x 15  Standing bilateral shoulder rows c scap retraction green band 2 x 15  Standing bilateral shoulder GH ext green band 2 x 15  Lat pulldown machine 25# 2X12 Seated row machine 35# 2X12 Chest press machine 20# 2X10    06/13/2023 TherEx: Lumbar extension AROM x 5  Nustep lvl 5 10 mins UE/LE  (cues for progression time on machine at gym to progress endurance) Leg press double leg 100 lbs 2 x 15  Standing bilateral shoulder rows c scap retraction green band 2 x 15  Standing bilateral shoulder GH ext green band 2 x 15  ROM and strength testing as documented above.     TODAY's TREATMENT:                                                             DATE: 06/10/23 TherEx: Nu step L5 X 6 min UE/LE UBE L3 X 3 min fwd, 3 min bkwd Lat pulldown machine 20# 2X10 Seated row machine 35# 2X12 Chest press machine 15# 2X10 Shoulder extensions green 2X12 Leg press machine 100# 2X12 Cervical rotation using towel roll for added stretch x 5 to each side holding 10 sec Cervical isometrics 5 sec X 10 each plane  TODAY's TREATMENT:                                                             DATE: 05/29/23 TherEx: Nu step L4 X 6 min UE/LE UBE L3 X 2.5 min fwd, 2.5 min bkwd Lat pulldown machine 20# 2X10 Seated row machine 35# 2X10 Chest press machine 15# 2X10 Cervical rotation using towel roll for added stretch x 5 to each side holding 10 sec Chin isometrics 5 sec X 10 each  plane FOTO update   PATIENT EDUCATION:  Eval:  Education details: HEP Person educated: Patient Education method: Programmer, multimedia, Facilities manager, and Handouts Education comprehension: verbalized understanding, returned demonstration, and needs further education  HOME EXERCISE PROGRAM: Access Code: [email or web address] URL: [email or web address] Date: 05/06/2023 Prepared by: Ivery Quale  Exercises - Standing Paraspinals Mobilization with Small Ball on Wall  - 3 x daily - 7 x weekly - 1 sets - 1 reps - 2-3 min hold - Standing Scapular Retraction  - 1 x daily - 7 x weekly - 1 sets - 10 reps - 5 sec hold - Standing Backward Shoulder Rolls  - 1 x daily - 7 x weekly - 1 sets - 10 reps - Seated Knee Extension AROM  - 1 x daily - 7 x weekly - 1-2 sets - 10 reps - Seated Straight Leg Raise  - 1 x daily - 7 x weekly - 1-2 sets - 10 reps - Sit to Stand  - 1 x daily - 7 x weekly - 1-2 sets - 10 reps - Seated Assisted Cervical Rotation with Towel  - 2 x daily - 6 x weekly -  1 sets - 10 reps - 5 hold - Seated Isometric Cervical Sidebending  - 2 x daily - 6 x weekly - 1 sets - 10 reps - 5 hold - Seated Isometric Cervical Extension  - 2 x daily - 6 x weekly - 1 sets - 10 reps - 5 hold - Seated Isometric Cervical Flexion  - 2 x daily - 6 x weekly - 1 sets - 10 reps - 6 hold - Seated Passive Cervical Retraction  - 2 x daily - 6 x weekly - 1 sets - 10 reps   ASSESSMENT:  CLINICAL IMPRESSION: Her back and neck pain was doing better but limited by bilateral knee pain today. We continued to work to improve her strength and endurance within her pain tolerance today to improve overall functional abilities.      OBJECTIVE IMPAIRMENTS: Abnormal gait, decreased balance, decreased mobility, difficulty walking, decreased ROM, decreased strength, increased fascial restrictions, increased muscle spasms, postural dysfunction, and pain.   ACTIVITY LIMITATIONS: carrying, lifting, bending, sitting, standing,  squatting, sleeping, stairs, transfers, and locomotion level  PARTICIPATION LIMITATIONS: meal prep, cleaning, laundry, driving, shopping, and community activity  PERSONAL FACTORS: 3+ comorbidities: COPD, PTSD,  Rt THA 8/ 2020. Left THA 03/2019, Bipolar disorder, anxiety  are also affecting patient's functional outcome.   REHAB POTENTIAL: Good  CLINICAL DECISION MAKING: Evolving/moderate complexity  EVALUATION COMPLEXITY: Moderate   GOALS: Goals reviewed with patient? Yes  SHORT TERM GOALS: Target date: 05/21/2023  Independent with initial HEP Goal status: MET 05/15/23  LONG TERM GOALS: Target date: 07/25/2023    Independent with final HEP Goal status: revised  2.  FOTO score improved to 44 Goal status: revised  3.  Bil UE shoulder strength improved to 5/5 for improved function Goal status: revised  4.  Report pain < 2/10 in upper back with reaching and overhead activities for improved function Goal status: revised  5. Pt will improve neck rotation ROM >35 degrees each side for improved scanning of her enviornment Goal status: revised  6. Pt will demonstrate/report ability to walk 10-15 mins for functional walking.   Goal status: new   PLAN:  PT FREQUENCY: 1-2x/week  PT DURATION: 6 weeks  PLANNED INTERVENTIONS: Therapeutic exercises, Therapeutic activity, Neuromuscular re-education, Balance training, Gait training, Patient/Family education, Self Care, Joint mobilization, Aquatic Therapy, Dry Needling, Electrical stimulation, Spinal manipulation, Spinal mobilization, Cryotherapy, Moist heat, Taping, Traction, Manual therapy, and Re-evaluation.  PLAN FOR NEXT SESSION:   Progressive walking duration improvements, LE strengthening.  General endurance gains.   NEXT MD VISIT: PRN  Ivery Quale, PT, DPT 06/17/23 2:47 PM

## 2023-06-19 ENCOUNTER — Encounter: Payer: Self-pay | Admitting: Physical Therapy

## 2023-06-19 ENCOUNTER — Ambulatory Visit (INDEPENDENT_AMBULATORY_CARE_PROVIDER_SITE_OTHER): Payer: Medicare Other | Admitting: Physical Therapy

## 2023-06-19 DIAGNOSIS — R293 Abnormal posture: Secondary | ICD-10-CM

## 2023-06-19 DIAGNOSIS — M546 Pain in thoracic spine: Secondary | ICD-10-CM

## 2023-06-19 DIAGNOSIS — M25562 Pain in left knee: Secondary | ICD-10-CM

## 2023-06-19 DIAGNOSIS — G8929 Other chronic pain: Secondary | ICD-10-CM

## 2023-06-19 DIAGNOSIS — M6281 Muscle weakness (generalized): Secondary | ICD-10-CM

## 2023-06-19 DIAGNOSIS — R262 Difficulty in walking, not elsewhere classified: Secondary | ICD-10-CM

## 2023-06-19 NOTE — Therapy (Signed)
OUTPATIENT PHYSICAL THERAPY TREATMENT NOTE   Patient Name: Judy [name] MRN: [id number] DOB:February 01, 1951, 72 y.o., female Today's Date: 06/19/2023  END OF SESSION:  PT End of Session - 06/19/23 1427     Visit Number 11    Number of Visits 20    Date for PT Re-Evaluation 07/25/23    Authorization Type Medicare/[employer]    Progress Note Due on Visit 19    PT Start Time 1425    PT Stop Time 1505    PT Time Calculation (min) 40 min    Activity Tolerance Patient limited by fatigue    Behavior During Therapy WFL for tasks assessed/performed                  Past Medical History:  Diagnosis Date   Anxiety    Arthritis    Avascular necrosis (HCC)    Left hip   Avascular necrosis of bone of hip, 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   Bipolar 1 disorder (HCC)    COPD (chronic obstructive pulmonary disease) (HCC)    Depression    Dyspnea    Pneumonia    PTSD (post-traumatic stress disorder)    Past Surgical History:  Procedure Laterality Date   bone spurs foot Right    CATARACT EXTRACTION Bilateral    COLONOSCOPY     COLONOSCOPY  07/2022   HAND SURGERY Left    JOINT REPLACEMENT     TONSILLECTOMY     TONSILLECTOMY AND ADENOIDECTOMY     TOTAL HIP ARTHROPLASTY Left 03/19/2019   Procedure: LEFT TOTAL HIP ARTHROPLASTY ANTERIOR APPROACH;  Surgeon: [name], MD;  Location: [facility];  Service: Orthopedics;  Laterality: Left;   TOTAL HIP ARTHROPLASTY Right 06/04/2019   Procedure: RIGHT TOTAL HIP ARTHROPLASTY ANTERIOR APPROACH;  Surgeon: [name], MD;  Location: [facility];  Service: Orthopedics;  Laterality: Right;   TOTAL KNEE ARTHROPLASTY Left 12/28/2021   Procedure: Left TOTAL KNEE ARTHROPLASTY;  Surgeon: [name], MD;  Location: [facility];  Service: Orthopedics;  Laterality: Left;   Patient Active Problem List   Diagnosis Date Noted   Prediabetes 08/09/2022   Restless legs  syndrome 08/09/2022   Status post total left knee replacement 12/28/2021   Primary osteoarthritis of left knee 10/25/2021   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) 11/30/2020   Status post total replacement of right hip 06/04/2019   Status post total replacement of left hip 03/19/2019   PTSD (post-traumatic stress disorder)    COPD (chronic obstructive pulmonary disease) (HCC)    Bipolar 1 disorder (HCC)    Anxiety    B12 deficiency 12/09/2018   Hyperglycemia 04/14/2017    PCP: Karie Georges, MD  REFERRING PROVIDER: Juanda Chance, NP  REFERRING DIAG: [phone] (ICD-10-CM) - Myofascial pain syndrome  Rationale for Evaluation and Treatment: Rehabilitation  THERAPY DIAG:  Pain in thoracic spine  Muscle weakness (generalized)  Chronic pain of left knee  Abnormal posture  Difficulty in walking, not elsewhere classified  ONSET DATE: 2 weeks ago   SUBJECTIVE:  SUBJECTIVE STATEMENT: Didn't have a great night; knees were hurting last night   PERTINENT HISTORY:  COPD, PTSD, Rt THA 8/ 2020. Left THA 03/2019, Bipolar disorder, anxiety  PAIN:  NPRS scale: 1/10 Pain location: knees today Pain description: stabbing; weakness, hot knife and twisting in back Aggravating factors: walking Relieving factors: lying down  PRECAUTIONS:  None  RED FLAGS: None   WEIGHT BEARING RESTRICTIONS:  No  FALLS:  Has patient fallen in last 6 months? 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 and bilateral lateral recess stenosis at L3-L4, there is also severe facet arthropathy and medially oriented facet synovial cyst at this level  PATIENT SURVEYS:  05/29/23: FOTO improved to 45% and met goal for this  04/30/23: FOTO 33 (predicted 44)   SENSATION: WFL  POSTURE:  04/30/2023 rounded shoulders and forward head  GAIT: 06/13/2023:  independent   04/30/23:  Distance walked: 100' Assistive device utilized: None Level of assistance: Modified independence Comments: antalgic gait   BACK  PALPATION: 04/30/23: very tender to palpation along Lt rhomboids   UPPER EXTREMITY MMT:  MMT Right eval Left eval Right 06/13/2023 Left 06/13/2023  Shoulder flexion 3/5 3/5 with pain 4+/5 4+/5  Shoulder extension      Shoulder abduction 3/5 3/5 with pain 4+/5 4+/5  Shoulder adduction      Shoulder extension      Shoulder internal rotation   5/5 5/5  Shoulder external rotation   5/5 5/5   (Blank rows = not tested)   LUMBAR ROM:   ROM A/PROM  eval AROM 06/13/2023  Flexion Limited  50%   Extension WNL 75% WFL   Repeated x5 improved to 100 % WFL  Right Quadrant Limited 25% with pain   Left Quadrant Limited  25%    (Blank rows = not tested)  Neck ROM:   ROM AROM  05/06/23 AROM 05/13/23 AROM 06/13/2023  Flexion 15 deg 18 28  Extension 20 deg 20 30  Left rotation 15 deg 30 30  Right rotation 25 deg 35 50     Lower Extremity Right EVAL Left EVAL Right 06/13/2023 Left 06/13/2023   A/PROM MMT A/PROM MMT MMT MMT  Hip Flexion  3/5  3/5 5/5 5/5  Hip Extension        Hip Abduction        Hip Adduction        Hip Internal rotation        Hip External rotation        Knee Flexion  4/5  4/5 5/5 5/5  Knee Extension  4/5  4/5 5/5 5/5   (Blank rows = not tested)  *  pain  ___________________________________________________________________________________  TODAY's TREATMENT:                                                             DATE:  06/19/23 TherEx NuStep L5 x  10 min; occasional rest breaks needed due to breathing Rows L4 band 2x10 Standing bilateral shoulder GH ext L4 band 2x10 Sit to/from stand 2x5 Pball rolling up wall for shoulder flexion and thoracic extension 5 sec X 10 reps  BATCA rows 35# 2x10 BATCA lat pull downs 25# 2x10 BATCA chest press 20# 2x10 Seated ant/post weight shift with 6# med ball 2x10 Seated trunk rotation with 6# med ball 2x10 bil Seated overhead press with 6# med ball 2x10   06/17/2023 TherEx: Lumbar extension AROM x 5  Nustep lvl 5 8 mins UE/LE  Pball rolling up wall for shoulder flexion and thoracic extension 5 sec X 12 reps Leg press double leg 100 lbs 2 x 15  Standing bilateral shoulder rows c scap retraction green band 2 x 15  Standing bilateral shoulder GH ext green band 2 x 15  Lat pulldown machine 25# 2X12 Seated row machine 35# 2X12 Chest press machine 20# 2X10    06/13/2023 TherEx: Lumbar extension AROM x 5  Nustep lvl 5 10 mins UE/LE  (cues for progression time on machine at gym to progress endurance) Leg press double leg 100 lbs 2 x 15  Standing bilateral shoulder rows c scap retraction green band 2 x 15  Standing bilateral shoulder GH ext green band 2 x 15  ROM and strength testing as documented above.     TODAY's TREATMENT:                                                             DATE: 06/10/23 TherEx: Nu step L5 X 6 min UE/LE UBE L3 X 3 min fwd, 3 min bkwd Lat pulldown machine 20# 2X10 Seated row machine 35# 2X12 Chest press machine 15# 2X10 Shoulder extensions green 2X12 Leg press machine 100# 2X12 Cervical rotation using towel roll for added stretch x 5 to each side holding 10 sec Cervical isometrics 5 sec X 10 each plane  TODAY's TREATMENT:                                                              DATE: 05/29/23 TherEx: Nu step L4 X 6 min UE/LE UBE L3 X 2.5 min fwd, 2.5 min bkwd Lat pulldown machine 20# 2X10 Seated row machine 35# 2X10 Chest press machine 15# 2X10 Cervical rotation using towel roll for added stretch x 5 to each side holding 10 sec Chin isometrics 5 sec X 10 each plane FOTO update   PATIENT EDUCATION:  Eval:  Education details: HEP Person educated: Patient Education method: Programmer, multimedia, Facilities manager, and Handouts Education comprehension: verbalized understanding, returned demonstration, and needs further education  HOME EXERCISE PROGRAM: Access Code: [email or web address] URL: [email or web address] Date: 05/06/2023 Prepared by: Ivery Quale  Exercises - Standing Paraspinals Mobilization with Small Ball on Wall  - 3 x daily - 7 x weekly - 1 sets - 1 reps - 2-3 min hold - Standing Scapular Retraction  - 1 x daily - 7 x weekly - 1 sets - 10 reps - 5 sec hold - Standing Backward Shoulder Rolls  - 1 x daily - 7 x  weekly - 1 sets - 10 reps - Seated Knee Extension AROM  - 1 x daily - 7 x weekly - 1-2 sets - 10 reps - Seated Straight Leg Raise  - 1 x daily - 7 x weekly - 1-2 sets - 10 reps - Sit to Stand  - 1 x daily - 7 x weekly - 1-2 sets - 10 reps - Seated Assisted Cervical Rotation with Towel  - 2 x daily - 6 x weekly - 1 sets - 10 reps - 5 hold - Seated Isometric Cervical Sidebending  - 2 x daily - 6 x weekly - 1 sets - 10 reps - 5 hold - Seated Isometric Cervical Extension  - 2 x daily - 6 x weekly - 1 sets - 10 reps - 5 hold - Seated Isometric Cervical Flexion  - 2 x daily - 6 x weekly - 1 sets - 10 reps - 6 hold - Seated Passive Cervical Retraction  - 2 x daily - 6 x weekly - 1 sets - 10 reps   ASSESSMENT:  CLINICAL IMPRESSION: Pt tolerated session well today despite complaints of feeling weak, and overall demonstrated good tolerance to strengthening exercises.  Will continue to benefit from PT to maximize function.    OBJECTIVE IMPAIRMENTS: Abnormal gait, decreased balance, decreased mobility, difficulty walking, decreased ROM, decreased strength, increased fascial restrictions, increased muscle spasms, postural dysfunction, and pain.   ACTIVITY LIMITATIONS: carrying, lifting, bending, sitting, standing, squatting, sleeping, stairs, transfers, and locomotion level  PARTICIPATION LIMITATIONS: meal prep, cleaning, laundry, driving, shopping, and community activity  PERSONAL FACTORS: 3+ comorbidities: COPD, PTSD,  Rt THA 8/ 2020. Left THA 03/2019, Bipolar disorder, anxiety  are also affecting patient's functional outcome.   REHAB POTENTIAL: Good  CLINICAL DECISION MAKING: Evolving/moderate complexity  EVALUATION COMPLEXITY: Moderate   GOALS: Goals reviewed with patient? Yes  SHORT TERM GOALS: Target date: 05/21/2023  Independent with initial HEP Goal status: MET 05/15/23  LONG TERM GOALS: Target date: 07/25/2023    Independent with final HEP Goal status: revised  2.  FOTO score improved to 44 Goal status: revised  3.  Bil UE shoulder strength improved to 5/5 for improved function Goal status: revised  4.  Report pain < 2/10 in upper back with reaching and overhead activities for improved function Goal status: revised  5. Pt will improve neck rotation ROM >35 degrees each side for improved scanning of her enviornment Goal status: revised  6. Pt will demonstrate/report ability to walk 10-15 mins for functional walking.   Goal status: new   PLAN:  PT FREQUENCY: 1-2x/week  PT DURATION: 6 weeks  PLANNED INTERVENTIONS: Therapeutic exercises, Therapeutic activity, Neuromuscular re-education, Balance training, Gait training, Patient/Family education, Self Care, Joint mobilization, Aquatic Therapy, Dry Needling, Electrical stimulation, Spinal manipulation, Spinal mobilization, Cryotherapy, Moist heat, Taping, Traction, Manual therapy, and Re-evaluation.  PLAN FOR NEXT SESSION:   Core  strengthening, endurance Progressive walking duration improvements, LE strengthening  NEXT MD VISIT: PRN  Clarita Crane, PT, DPT 06/19/23 3:05 PM

## 2023-06-23 ENCOUNTER — Telehealth: Payer: Self-pay | Admitting: *Deleted

## 2023-06-23 DIAGNOSIS — G2581 Restless legs syndrome: Secondary | ICD-10-CM

## 2023-06-23 MED ORDER — ROPINIROLE HCL 1 MG PO TABS
ORAL_TABLET | ORAL | 0 refills | Status: DC
Start: 2023-06-23 — End: 2023-07-16

## 2023-06-23 NOTE — Telephone Encounter (Signed)
Rx done. 

## 2023-06-24 ENCOUNTER — Ambulatory Visit (INDEPENDENT_AMBULATORY_CARE_PROVIDER_SITE_OTHER): Payer: Medicare Other | Admitting: Physical Therapy

## 2023-06-24 ENCOUNTER — Encounter: Payer: Self-pay | Admitting: Physical Therapy

## 2023-06-24 DIAGNOSIS — M546 Pain in thoracic spine: Secondary | ICD-10-CM | POA: Diagnosis not present

## 2023-06-24 DIAGNOSIS — M6281 Muscle weakness (generalized): Secondary | ICD-10-CM

## 2023-06-24 NOTE — Therapy (Signed)
OUTPATIENT PHYSICAL THERAPY TREATMENT NOTE   Patient Name: Judy [name] MRN: [id number] DOB:1951-05-20, 73 y.o., female Today's Date: 06/24/2023  END OF SESSION:  PT End of Session - 06/24/23 1306     Visit Number 12    Number of Visits 20    Date for PT Re-Evaluation 07/25/23    Authorization Type Medicare/[employer]    Progress Note Due on Visit 19    PT Start Time 1303    PT Stop Time 1343    PT Time Calculation (min) 40 min    Activity Tolerance Patient limited by fatigue    Behavior During Therapy [facility] for tasks assessed/performed                   Past Medical History:  Diagnosis Date   Anxiety    Arthritis    Avascular necrosis (HCC)    Left hip   Avascular necrosis of bone of hip, 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   Bipolar 1 disorder (HCC)    COPD (chronic obstructive pulmonary disease) (HCC)    Depression    Dyspnea    Pneumonia    PTSD (post-traumatic stress disorder)    Past Surgical History:  Procedure Laterality Date   bone spurs foot Right    CATARACT EXTRACTION Bilateral    COLONOSCOPY     COLONOSCOPY  07/2022   HAND SURGERY Left    JOINT REPLACEMENT     TONSILLECTOMY     TONSILLECTOMY AND ADENOIDECTOMY     TOTAL HIP ARTHROPLASTY Left 03/19/2019   Procedure: LEFT TOTAL HIP ARTHROPLASTY ANTERIOR APPROACH;  Surgeon: [name], MD;  Location: [facility];  Service: Orthopedics;  Laterality: Left;   TOTAL HIP ARTHROPLASTY Right 06/04/2019   Procedure: RIGHT TOTAL HIP ARTHROPLASTY ANTERIOR APPROACH;  Surgeon: [name], MD;  Location: [facility];  Service: Orthopedics;  Laterality: Right;   TOTAL KNEE ARTHROPLASTY Left 12/28/2021   Procedure: Left TOTAL KNEE ARTHROPLASTY;  Surgeon: [name], MD;  Location: [facility];  Service: Orthopedics;  Laterality: Left;   Patient Active Problem List   Diagnosis Date Noted   Prediabetes 08/09/2022   Restless legs  syndrome 08/09/2022   Status post total left knee replacement 12/28/2021   Primary osteoarthritis of left knee 10/25/2021   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) 11/30/2020   Status post total replacement of right hip 06/04/2019   Status post total replacement of left hip 03/19/2019   PTSD (post-traumatic stress disorder)    COPD (chronic obstructive pulmonary disease) (HCC)    Bipolar 1 disorder (HCC)    Anxiety    B12 deficiency 12/09/2018   Hyperglycemia 04/14/2017    PCP: Karie Georges, MD  REFERRING PROVIDER: Juanda Chance, NP  REFERRING DIAG: [phone] (ICD-10-CM) - Myofascial pain syndrome  Rationale for Evaluation and Treatment: Rehabilitation  THERAPY DIAG:  Pain in thoracic spine  Muscle weakness (generalized)  ONSET DATE: 2 weeks ago   SUBJECTIVE:  SUBJECTIVE STATEMENT: Doing pretty well; going to the gym consistently   PERTINENT HISTORY:  COPD, PTSD, Rt THA 8/ 2020. Left THA 03/2019, Bipolar disorder, anxiety  PAIN:  NPRS scale: 1/10 Pain location: knees today Pain description: stabbing; weakness, hot knife and twisting in back Aggravating factors: walking Relieving factors: lying down  PRECAUTIONS:  None  RED FLAGS: None   WEIGHT BEARING RESTRICTIONS:  No  FALLS:  Has patient fallen in last 6 months? No  LIVING ENVIRONMENT: Lives with: lives alone Lives in: House/apartment Stairs: Yes: External: 4 steps; can reach both  OCCUPATION:  retired  PLOF:  Independent and Leisure: watch TV/election results  PATIENT GOALS:  Feel better before heading to beach in 2 week   OBJECTIVE: (objective measures completed at initial evaluation unless otherwise dated)   DIAGNOSTIC FINDINGS:  Lumbar MRI imaging from January of this year exhibits  mild to moderate spinal canal stenosis and bilateral lateral recess stenosis at L3-L4, there is also severe facet arthropathy and medially oriented facet synovial cyst at this level  PATIENT SURVEYS:  05/29/23: FOTO improved to 45% and met goal for this  04/30/23: FOTO 33 (predicted 44)   SENSATION: WFL  POSTURE:  04/30/2023 rounded shoulders and forward head  GAIT: 06/13/2023:  independent   04/30/23:  Distance walked: 100' Assistive device utilized: None Level of assistance: Modified independence Comments: antalgic gait   BACK  PALPATION: 04/30/23: very tender to palpation along Lt rhomboids   UPPER EXTREMITY MMT:  MMT Right eval Left eval Right 06/13/2023 Left 06/13/2023  Shoulder flexion 3/5 3/5 with pain 4+/5 4+/5  Shoulder extension      Shoulder abduction 3/5 3/5 with pain 4+/5 4+/5  Shoulder adduction      Shoulder extension      Shoulder internal rotation   5/5 5/5  Shoulder external rotation   5/5 5/5   (Blank rows = not tested)   LUMBAR ROM:   ROM A/PROM  eval AROM 06/13/2023  Flexion Limited  50%   Extension WNL 75% WFL   Repeated x5 improved to 100 % WFL  Right Quadrant Limited 25% with pain   Left Quadrant Limited  25%    (Blank rows = not tested)  Neck ROM:   ROM AROM  05/06/23 AROM 05/13/23 AROM 06/13/2023  Flexion 15 deg 18 28  Extension 20 deg 20 30  Left rotation 15 deg 30 30  Right rotation 25 deg 35 50     Lower Extremity Right EVAL Left EVAL Right 06/13/2023 Left 06/13/2023   A/PROM MMT A/PROM MMT MMT MMT  Hip Flexion  3/5  3/5 5/5 5/5  Hip Extension        Hip Abduction        Hip Adduction        Hip Internal rotation        Hip External rotation        Knee Flexion  4/5  4/5 5/5 5/5  Knee Extension  4/5  4/5 5/5 5/5   (Blank rows = not tested)  * pain  ___________________________________________________________________________________  TODAY's TREATMENT:                                                             DATE:   06/24/23 TherEx BATCA chest press 20# 2x10  BATCA rows 35# 2x10 BATCA lat pull downs 25# 2x10 Seated ant/post weight shift with 6# med ball 2x10 Seated trunk rotation with 6# med ball 2x10 bil Seated overhead diagonal chop with 6# med ball x 10 each direction Sit to/from stand with 6# med ball x10 NuStep L6 x 10 min Bicep curls with overhead press 4# each; 2x10 bil   06/19/23 TherEx NuStep L5 x 10 min; occasional rest breaks needed due to breathing Rows L4 band 2x10 Standing bilateral shoulder GH ext L4 band 2x10 Sit to/from stand 2x5 Pball rolling up wall for shoulder flexion and thoracic extension 5 sec X 10 reps  BATCA rows 35# 2x10 BATCA lat pull downs 25# 2x10 BATCA chest press 20# 2x10 Seated ant/post weight shift with 6# med ball 2x10 Seated trunk rotation with 6# med ball 2x10 bil Seated overhead press with 6# med ball 2x10   06/17/2023 TherEx: Lumbar extension AROM x 5  Nustep lvl 5 8 mins UE/LE  Pball rolling up wall for shoulder flexion and thoracic extension 5 sec X 12 reps Leg press double leg 100 lbs 2 x 15  Standing bilateral shoulder rows c scap retraction green band 2 x 15  Standing bilateral shoulder GH ext green band 2 x 15  Lat pulldown machine 25# 2X12 Seated row machine 35# 2X12 Chest press machine 20# 2X10    06/13/2023 TherEx: Lumbar extension AROM x 5  Nustep lvl 5 10 mins UE/LE  (cues for progression time on machine at gym to progress endurance) Leg press double leg 100 lbs 2 x 15  Standing bilateral shoulder rows c scap retraction green band 2 x 15  Standing bilateral shoulder GH ext green band 2 x 15  ROM and strength testing as documented above.     PATIENT EDUCATION:  Eval:  Education details: HEP Person educated: Patient Education method: Programmer, multimedia, Facilities manager, and Handouts Education comprehension: verbalized understanding, returned demonstration, and needs further education  HOME EXERCISE PROGRAM: Access Code:  [email or web address] URL: [email or web address] Date: 05/06/2023 Prepared by: Ivery Quale  Exercises - Standing Paraspinals Mobilization with Small Ball on Wall  - 3 x daily - 7 x weekly - 1 sets - 1 reps - 2-3 min hold - Standing Scapular Retraction  - 1 x daily - 7 x weekly - 1 sets - 10 reps - 5 sec hold - Standing Backward Shoulder Rolls  - 1 x daily - 7 x weekly - 1 sets - 10 reps - Seated Knee Extension AROM  - 1 x daily - 7 x weekly - 1-2 sets - 10 reps - Seated Straight Leg Raise  - 1 x daily - 7 x weekly - 1-2 sets - 10 reps - Sit to Stand  - 1 x daily - 7 x weekly - 1-2 sets - 10 reps - Seated Assisted Cervical Rotation with Towel  - 2 x daily - 6 x weekly - 1 sets - 10 reps - 5 hold - Seated Isometric Cervical Sidebending  - 2 x daily - 6 x weekly - 1 sets - 10 reps - 5 hold - Seated Isometric Cervical Extension  - 2 x daily - 6 x weekly - 1 sets - 10 reps - 5 hold - Seated Isometric Cervical Flexion  - 2 x daily - 6 x weekly - 1 sets - 10 reps - 6 hold - Seated Passive Cervical Retraction  - 2 x daily - 6 x weekly - 1 sets - 10 reps   ASSESSMENT:  CLINICAL  IMPRESSION: Pt reports consistency with gym program and is doing well with PT overall.  May be ready for transition to community fitness in the next few visits.    OBJECTIVE IMPAIRMENTS: Abnormal gait, decreased balance, decreased mobility, difficulty walking, decreased ROM, decreased strength, increased fascial restrictions, increased muscle spasms, postural dysfunction, and pain.   ACTIVITY LIMITATIONS: carrying, lifting, bending, sitting, standing, squatting, sleeping, stairs, transfers, and locomotion level  PARTICIPATION LIMITATIONS: meal prep, cleaning, laundry, driving, shopping, and community activity  PERSONAL FACTORS: 3+ comorbidities: COPD, PTSD,  Rt THA 8/ 2020. Left THA 03/2019, Bipolar disorder, anxiety  are also affecting patient's functional outcome.   REHAB POTENTIAL: Good  CLINICAL DECISION MAKING:  Evolving/moderate complexity  EVALUATION COMPLEXITY: Moderate   GOALS: Goals reviewed with patient? Yes  SHORT TERM GOALS: Target date: 05/21/2023  Independent with initial HEP Goal status: MET 05/15/23  LONG TERM GOALS: Target date: 07/25/2023    Independent with final HEP Goal status: revised  2.  FOTO score improved to 44 Goal status: MET 05/29/23  3.  Bil UE shoulder strength improved to 5/5 for improved function Goal status: revised  4.  Report pain < 2/10 in upper back with reaching and overhead activities for improved function Goal status: revised  5. Pt will improve neck rotation ROM >35 degrees each side for improved scanning of her enviornment Goal status: revised  6. Pt will demonstrate/report ability to walk 10-15 mins for functional walking.   Goal status: new   PLAN:  PT FREQUENCY: 1-2x/week  PT DURATION: 6 weeks  PLANNED INTERVENTIONS: Therapeutic exercises, Therapeutic activity, Neuromuscular re-education, Balance training, Gait training, Patient/Family education, Self Care, Joint mobilization, Aquatic Therapy, Dry Needling, Electrical stimulation, Spinal manipulation, Spinal mobilization, Cryotherapy, Moist heat, Taping, Traction, Manual therapy, and Re-evaluation.  PLAN FOR NEXT SESSION:   Begin d/c planning, finalize HEP Core strengthening, endurance Progressive walking duration improvements, LE strengthening  NEXT MD VISIT: PRN  Clarita Crane, PT, DPT 06/24/23 1:52 PM

## 2023-06-26 ENCOUNTER — Ambulatory Visit (INDEPENDENT_AMBULATORY_CARE_PROVIDER_SITE_OTHER): Payer: Medicare Other | Admitting: Physical Therapy

## 2023-06-26 ENCOUNTER — Encounter: Payer: Self-pay | Admitting: Physical Therapy

## 2023-06-26 DIAGNOSIS — R262 Difficulty in walking, not elsewhere classified: Secondary | ICD-10-CM

## 2023-06-26 DIAGNOSIS — M546 Pain in thoracic spine: Secondary | ICD-10-CM

## 2023-06-26 DIAGNOSIS — M25562 Pain in left knee: Secondary | ICD-10-CM | POA: Diagnosis not present

## 2023-06-26 DIAGNOSIS — R293 Abnormal posture: Secondary | ICD-10-CM

## 2023-06-26 DIAGNOSIS — M6281 Muscle weakness (generalized): Secondary | ICD-10-CM | POA: Diagnosis not present

## 2023-06-26 DIAGNOSIS — G8929 Other chronic pain: Secondary | ICD-10-CM

## 2023-06-26 NOTE — Therapy (Signed)
OUTPATIENT PHYSICAL THERAPY TREATMENT NOTE DISCHARGE SUMMARY   Patient Name: Judy [name] MRN: [id number] DOB:[birth year], 72 y.o., female Today's Date: 06/26/2023  END OF SESSION:  PT End of Session - 06/26/23 1436     Visit Number 13    Number of Visits 20    Date for PT Re-Evaluation 07/25/23    Authorization Type Medicare/[employer]    Progress Note Due on Visit 19    PT Start Time 1436   pt arrived late   PT Stop Time 1501    PT Time Calculation (min) 25 min    Activity Tolerance Patient limited by fatigue    Behavior During Therapy [facility] for tasks assessed/performed                    Past Medical History:  Diagnosis Date   Anxiety    Arthritis    Avascular necrosis (HCC)    Left hip   Avascular necrosis of bone of hip, 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   Bipolar 1 disorder (HCC)    COPD (chronic obstructive pulmonary disease) (HCC)    Depression    Dyspnea    Pneumonia    PTSD (post-traumatic stress disorder)    Past Surgical History:  Procedure Laterality Date   bone spurs foot Right    CATARACT EXTRACTION Bilateral    COLONOSCOPY     COLONOSCOPY  07/2022   HAND SURGERY Left    JOINT REPLACEMENT     TONSILLECTOMY     TONSILLECTOMY AND ADENOIDECTOMY     TOTAL HIP ARTHROPLASTY Left 03/19/2019   Procedure: LEFT TOTAL HIP ARTHROPLASTY ANTERIOR APPROACH;  Surgeon: [name], MD;  Location: [facility];  Service: Orthopedics;  Laterality: Left;   TOTAL HIP ARTHROPLASTY Right 06/04/2019   Procedure: RIGHT TOTAL HIP ARTHROPLASTY ANTERIOR APPROACH;  Surgeon: [name], MD;  Location: [facility];  Service: Orthopedics;  Laterality: Right;   TOTAL KNEE ARTHROPLASTY Left 12/28/2021   Procedure: Left TOTAL KNEE ARTHROPLASTY;  Surgeon: [name], MD;  Location: [facility];  Service: Orthopedics;  Laterality: Left;   Patient Active Problem List   Diagnosis Date Noted    Prediabetes 08/09/2022   Restless legs syndrome 08/09/2022   Status post total left knee replacement 12/28/2021   Primary osteoarthritis of left knee 10/25/2021   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) 11/30/2020   Status post total replacement of right hip 06/04/2019   Status post total replacement of left hip 03/19/2019   PTSD (post-traumatic stress disorder)    COPD (chronic obstructive pulmonary disease) (HCC)    Bipolar 1 disorder (HCC)    Anxiety    B12 deficiency 12/09/2018   Hyperglycemia 04/14/2017    PCP: Karie Georges, MD  REFERRING PROVIDER: Juanda Chance, NP  REFERRING DIAG: [phone] (ICD-10-CM) - Myofascial pain syndrome  Rationale for Evaluation and Treatment: Rehabilitation  THERAPY DIAG:  Pain in thoracic spine  Muscle weakness (generalized)  Chronic pain of left knee  Abnormal posture  Difficulty in walking, not elsewhere classified  ONSET DATE: 2 weeks ago   SUBJECTIVE:  SUBJECTIVE STATEMENT: Didn't sleep well last night; foot having less swelling   PERTINENT HISTORY:  COPD, PTSD, Rt THA 8/ 2020. Left THA 03/2019, Bipolar disorder, anxiety  PAIN:  NPRS scale: 0/10 Pain location: knees today Pain description: stabbing; weakness, hot knife and twisting in back Aggravating factors: walking Relieving factors: lying down  PRECAUTIONS:  None  RED FLAGS: None   WEIGHT BEARING RESTRICTIONS:  No  FALLS:  Has patient fallen in last 6 months? 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 and bilateral lateral recess stenosis at L3-L4, there is also severe facet arthropathy and medially oriented facet synovial cyst at this level  PATIENT SURVEYS:  05/29/23: FOTO improved to 45% and met goal for this  04/30/23: FOTO 33 (predicted 44)   SENSATION: WFL  POSTURE:  04/30/2023 rounded shoulders and forward head  GAIT: 06/13/2023:  independent   04/30/23:  Distance walked: 100' Assistive device utilized: None Level of assistance: Modified independence Comments: antalgic gait   BACK  PALPATION: 04/30/23: very tender to palpation along Lt rhomboids   UPPER EXTREMITY MMT:  MMT Right eval Left eval Right 06/13/2023 Left 06/13/2023 Rt/Lt 06/26/23  Shoulder flexion 3/5 3/5 with pain 4+/5 4+/5 5/5  Shoulder extension     5/5  Shoulder abduction 3/5 3/5 with pain 4+/5 4+/5   Shoulder adduction       Shoulder extension       Shoulder internal rotation   5/5 5/5   Shoulder external rotation   5/5 5/5    (Blank rows = not tested)   LUMBAR ROM:   ROM A/PROM  eval AROM 06/13/2023  Flexion Limited  50%   Extension WNL 75% WFL   Repeated x5 improved to 100 % WFL  Right Quadrant Limited 25% with pain   Left Quadrant Limited  25%    (Blank rows = not tested)  Neck ROM:   ROM AROM  05/06/23 AROM 05/13/23 AROM 06/13/2023 AROM 06/26/23  Flexion 15 deg 18 28   Extension 20 deg 20 30   Left rotation 15 deg 30 30 30   Right rotation 25 deg 35 50 35     Lower Extremity Right EVAL Left EVAL Right 06/13/2023 Left 06/13/2023   A/PROM MMT A/PROM MMT MMT MMT  Hip Flexion  3/5  3/5 5/5 5/5  Hip Extension        Hip Abduction        Hip Adduction        Hip Internal rotation        Hip External rotation        Knee Flexion  4/5  4/5 5/5 5/5  Knee Extension  4/5  4/5 5/5 5/5   (Blank rows =  not tested)  * pain  ___________________________________________________________________________________  TODAY's TREATMENT:  DATE:  06/26/23 TherEx NuStep L6 x 10 min BATCA rows 35# 2x10 BATCA lat pull downs 25# 2x10 BATCA chest press 20# 2x10 ROM and MMT - see above for details   06/24/23 TherEx BATCA chest press 20# 2x10 BATCA rows 35# 2x10 BATCA lat pull downs 25# 2x10 Seated ant/post weight shift with 6# med ball 2x10 Seated trunk rotation with 6# med ball 2x10 bil Seated overhead diagonal chop with 6# med ball x 10 each direction Sit to/from stand with 6# med ball x10 NuStep L6 x 10 min Bicep curls with overhead press 4# each; 2x10 bil   06/19/23 TherEx NuStep L5 x 10 min; occasional rest breaks needed due to breathing Rows L4 band 2x10 Standing bilateral shoulder GH ext L4 band 2x10 Sit to/from stand 2x5 Pball rolling up wall for shoulder flexion and thoracic extension 5 sec X 10 reps  BATCA rows 35# 2x10 BATCA lat pull downs 25# 2x10 BATCA chest press 20# 2x10 Seated ant/post weight shift with 6# med ball 2x10 Seated trunk rotation with 6# med ball 2x10 bil Seated overhead press with 6# med ball 2x10   06/17/2023 TherEx: Lumbar extension AROM x 5  Nustep lvl 5 8 mins UE/LE  Pball rolling up wall for shoulder flexion and thoracic extension 5 sec X 12 reps Leg press double leg 100 lbs 2 x 15  Standing bilateral shoulder rows c scap retraction green band 2 x 15  Standing bilateral shoulder GH ext green band 2 x 15  Lat pulldown machine 25# 2X12 Seated row machine 35# 2X12 Chest press machine 20# 2X10    06/13/2023 TherEx: Lumbar extension AROM x 5  Nustep lvl 5 10 mins UE/LE  (cues for progression time on machine at gym to progress endurance) Leg press double leg 100 lbs 2 x 15  Standing bilateral shoulder rows c scap retraction green band 2 x 15  Standing bilateral shoulder GH ext green band 2 x  15  ROM and strength testing as documented above.     PATIENT EDUCATION:  Eval:  Education details: HEP Person educated: Patient Education method: Programmer, multimedia, Facilities manager, and Handouts Education comprehension: verbalized understanding, returned demonstration, and needs further education  HOME EXERCISE PROGRAM: Access Code: [email or web address] URL: [email or web address] Date: 05/06/2023 Prepared by: Ivery Quale  Exercises - Standing Paraspinals Mobilization with Small Ball on Wall  - 3 x daily - 7 x weekly - 1 sets - 1 reps - 2-3 min hold - Standing Scapular Retraction  - 1 x daily - 7 x weekly - 1 sets - 10 reps - 5 sec hold - Standing Backward Shoulder Rolls  - 1 x daily - 7 x weekly - 1 sets - 10 reps - Seated Knee Extension AROM  - 1 x daily - 7 x weekly - 1-2 sets - 10 reps - Seated Straight Leg Raise  - 1 x daily - 7 x weekly - 1-2 sets - 10 reps - Sit to Stand  - 1 x daily - 7 x weekly - 1-2 sets - 10 reps - Seated Assisted Cervical Rotation with Towel  - 2 x daily - 6 x weekly - 1 sets - 10 reps - 5 hold - Seated Isometric Cervical Sidebending  - 2 x daily - 6 x weekly - 1 sets - 10 reps - 5 hold - Seated Isometric Cervical Extension  - 2 x daily - 6 x weekly - 1 sets - 10 reps - 5 hold - Seated Isometric Cervical Flexion  -  2 x daily - 6 x weekly - 1 sets - 10 reps - 6 hold - Seated Passive Cervical Retraction  - 2 x daily - 6 x weekly - 1 sets - 10 reps   ASSESSMENT:  CLINICAL IMPRESSION: Pt has met all goals except Lt cervical rotation still limited by 5 deg from goal.  Will d/c PT today and recommended continue with gym program.   OBJECTIVE IMPAIRMENTS: Abnormal gait, decreased balance, decreased mobility, difficulty walking, decreased ROM, decreased strength, increased fascial restrictions, increased muscle spasms, postural dysfunction, and pain.   ACTIVITY LIMITATIONS: carrying, lifting, bending, sitting, standing, squatting, sleeping, stairs, transfers, and  locomotion level  PARTICIPATION LIMITATIONS: meal prep, cleaning, laundry, driving, shopping, and community activity  PERSONAL FACTORS: 3+ comorbidities: COPD, PTSD,  Rt THA 8/ 2020. Left THA 03/2019, Bipolar disorder, anxiety  are also affecting patient's functional outcome.   REHAB POTENTIAL: Good  CLINICAL DECISION MAKING: Evolving/moderate complexity  EVALUATION COMPLEXITY: Moderate   GOALS: Goals reviewed with patient? Yes  SHORT TERM GOALS: Target date: 05/21/2023  Independent with initial HEP Goal status: MET 05/15/23  LONG TERM GOALS: Target date: 07/25/2023    Independent with final HEP Goal status: MET 06/26/23  2.  FOTO score improved to 44 Goal status: MET 05/29/23  3.  Bil UE shoulder strength improved to 5/5 for improved function Goal status: MET 06/26/23  4.  Report pain < 2/10 in upper back with reaching and overhead activities for improved function Goal status: MET 06/26/23  5. Pt will improve neck rotation ROM >35 degrees each side for improved scanning of her enviornment Goal status: PARTIALLY MET 06/26/23  6. Pt will demonstrate/report ability to walk 10-15 mins for functional walking.   Goal status: MET 06/26/23   PLAN:  PT FREQUENCY: 1-2x/week  PT DURATION: 6 weeks  PLANNED INTERVENTIONS: Therapeutic exercises, Therapeutic activity, Neuromuscular re-education, Balance training, Gait training, Patient/Family education, Self Care, Joint mobilization, Aquatic Therapy, Dry Needling, Electrical stimulation, Spinal manipulation, Spinal mobilization, Cryotherapy, Moist heat, Taping, Traction, Manual therapy, and Re-evaluation.  PLAN FOR NEXT SESSION:   D/C PT today  NEXT MD VISIT: PRN  Clarita Crane, PT, DPT 06/26/23 3:01 PM    PHYSICAL THERAPY DISCHARGE SUMMARY  Visits from Start of Care: 13  Current functional level related to goals / functional outcomes: See above   Remaining deficits: See above   Education / Equipment: HEP    Patient agrees to discharge. Patient goals were mostly met. Patient is being discharged due to meeting the stated rehab goals.  Clarita Crane, PT, DPT 06/26/23 3:01 PM  [facility] [address] [place], [place], [address] Phone: [phone]   Fax:  [phone]

## 2023-06-27 ENCOUNTER — Ambulatory Visit: Payer: Medicare Other | Admitting: Family Medicine

## 2023-06-27 ENCOUNTER — Encounter: Payer: Self-pay | Admitting: Family Medicine

## 2023-06-27 VITALS — BP 132/72 | HR 75 | Temp 98.2°F | Ht 70.0 in | Wt 241.5 lb

## 2023-06-27 DIAGNOSIS — F4310 Post-traumatic stress disorder, unspecified: Secondary | ICD-10-CM | POA: Diagnosis not present

## 2023-06-27 DIAGNOSIS — F319 Bipolar disorder, unspecified: Secondary | ICD-10-CM

## 2023-06-27 DIAGNOSIS — G2581 Restless legs syndrome: Secondary | ICD-10-CM

## 2023-06-27 DIAGNOSIS — R7303 Prediabetes: Secondary | ICD-10-CM

## 2023-06-27 DIAGNOSIS — F419 Anxiety disorder, unspecified: Secondary | ICD-10-CM

## 2023-06-27 DIAGNOSIS — E785 Hyperlipidemia, unspecified: Secondary | ICD-10-CM

## 2023-06-27 DIAGNOSIS — R635 Abnormal weight gain: Secondary | ICD-10-CM

## 2023-06-27 LAB — HEPATIC FUNCTION PANEL
ALT: 20 U/L (ref 0–35)
AST: 17 U/L (ref 0–37)
Albumin: 3.9 g/dL (ref 3.5–5.2)
Alkaline Phosphatase: 190 U/L — ABNORMAL HIGH (ref 39–117)
Bilirubin, Direct: 0.1 mg/dL (ref 0.0–0.3)
Total Bilirubin: 0.4 mg/dL (ref 0.2–1.2)
Total Protein: 6.9 g/dL (ref 6.0–8.3)

## 2023-06-27 LAB — TSH: TSH: 1.35 u[IU]/mL (ref 0.35–5.50)

## 2023-06-27 LAB — HEMOGLOBIN A1C: Hgb A1c MFr Bld: 6.6 % — ABNORMAL HIGH (ref 4.6–6.5)

## 2023-06-27 LAB — LIPID PANEL
Cholesterol: 129 mg/dL (ref 0–200)
HDL: 44.1 mg/dL (ref >39.00)
LDL Cholesterol: 61 mg/dL (ref 0–99)
NonHDL: 85.23
Total CHOL/HDL Ratio: 3
Triglycerides: 119 mg/dL (ref 0.0–149.0)
VLDL: 23.8 mg/dL (ref 0.0–40.0)

## 2023-06-27 NOTE — Patient Instructions (Signed)
Nasal saline spray several times a day to keep the nose moist

## 2023-06-27 NOTE — Progress Notes (Signed)
Established Patient Office Visit  Subjective   Patient ID: Judy [name], female    DOB: 1950-10-31  Age: 72 y.o. MRN: [id number]  Chief Complaint  Patient presents with   Medical Management of Chronic Issues    Pt is here for follow up today.Marland Kitchen  Restless leg-- pt feels like the medication isn't really working. States that she wants to come off the medication to see if it is actually helping.   Patient is reporting weight gain recently. States that she is having problems with the left knee, has been seeing the orthopedist Dr. Magnus Ivan, has completed physical therapy on her knee, states that she is getting back into her regular exercise. We discussed weight loss options, possibly using metformin for weight gain caused my medications.   PreDM-- pt reporting an increase in appetite, no blurry vision, no polyuria.   Patient reports that her psych provider was moved/changed practices. She was doing phone visit with [facility], would like to get someone here [place]. She reports she has plenty of medication, states that she has been stable for a long time on the current medications she is taking.     Current Outpatient Medications  Medication Instructions   atorvastatin (LIPITOR) 80 mg, Oral, Daily   Biotin 42706 MCG TABS Oral   Calcium-Phosphorus-Vitamin D (CITRACAL +D3 PO) 1 tablet, Oral, Daily, Calcium 650 mg & vitamin D 1000 units   Cholecalciferol (VITAMIN D3) 50 MCG (2000 UT) TABS 1 tablet, Oral, Every morning   Cyanocobalamin (VITAMIN B-12 PO) 2,500 mcg, Oral, Every morning   diclofenac (VOLTAREN) 75 mg, Oral, 2 times daily   FLUoxetine (PROZAC) 10 mg, Oral, Daily   FLUoxetine (PROZAC) 20 mg, Oral, Daily   Fluticasone-Umeclidin-Vilant (TRELEGY ELLIPTA) 100-62.5-25 MCG/ACT AEPB 1 puff, Inhalation, Daily   levalbuterol (XOPENEX HFA) 45 MCG/ACT inhaler 1 puff, Inhalation, Every 6 hours PRN   LORazepam (ATIVAN) 1 mg, Oral, As needed   LORazepam (ATIVAN) 1.5 mg,  Oral, Daily at bedtime   Misc Natural Products (NEURIVA) CAPS Oral   QUEtiapine (SEROQUEL) 800 mg, Oral, Daily at bedtime   rOPINIRole (REQUIP) 1 MG tablet TAKE 1/2 (ONE-HALF) TABLET BY MOUTH IN THE MORNING AND 2 AT BEDTIME   traMADol (ULTRAM) 50 mg, Oral, Every 8 hours PRN    Patient Active Problem List   Diagnosis Date Noted   Hyperlipidemia 07/04/2023   Prediabetes 08/09/2022   Restless legs syndrome 08/09/2022   Status post total left knee replacement 12/28/2021   Primary osteoarthritis of left knee 10/25/2021   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) 11/30/2020   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     Objective:     BP 132/72 (BP Location: Left Arm, Patient Position: Sitting, Cuff Size: Large)   Pulse 75   Temp 98.2 F (36.8 C) (Oral)   Ht 5\' 10"  (1.778 m)   Wt 241 lb 8 oz (109.5 kg)   SpO2 95%   BMI 34.65 kg/m    Physical Exam Vitals reviewed.  Constitutional:      Appearance: Normal appearance. She is well-groomed and normal weight.  Eyes:  Conjunctiva/sclera: Conjunctivae normal.  Neck:     Thyroid: No thyromegaly.  Cardiovascular:     Rate and Rhythm: Normal rate and regular rhythm.     Pulses: Normal pulses.     Heart sounds: S1 normal and S2 normal.  Pulmonary:     Effort: Pulmonary effort is normal.     Breath sounds: Normal breath sounds and air entry.  Abdominal:     General: Bowel sounds are normal.  Musculoskeletal:     Right lower leg: No edema.     Left lower leg: No edema.  Neurological:     Mental Status: She is alert and oriented to person, place, and time. Mental status is at baseline.      Gait: Gait is intact.  Psychiatric:        Mood and Affect: Mood and affect normal.        Speech: Speech normal.        Behavior: Behavior normal.        Judgment: Judgment normal.      The ASCVD Risk score (Arnett DK, et al., 2019) failed to calculate for the following reasons:   The valid total cholesterol range is 130 to 320 mg/dL    Assessment & Plan:  Prediabetes Assessment & Plan: Getting annual labs today, ordered A1C, not on any medications for this.  Orders: -     Hemoglobin A1c  Hyperlipidemia, unspecified hyperlipidemia type -     Lipid panel -     Hepatic function panel  Weight gain New symptom, likely due to medication she is taking for her bipolar disorder. Will check TSH to rule out hypothyroidism. We discussed weight loss medications, however I would not recommend stimulants like phentermine at this time  -     TSH  PTSD (post-traumatic stress disorder) -     Ambulatory referral to Psychiatry -     Ambulatory referral to Psychology  Anxiety -     Ambulatory referral to Psychiatry -     Ambulatory referral to Psychology  Bipolar 1 disorder [facility]) Assessment & Plan: Pt needs new referral to a different psychiatrist. Will place order.   Orders: -     Ambulatory referral to Psychiatry -     Ambulatory referral to Psychology  Restless legs syndrome Assessment & Plan: Pt is wanting to wean herself off of the requip, states she does not think this is working for her restless leg. Gave her verbal instructions to reduce by 1/2 tablet every few nights until she is off the medication.       Return in about 6 months (around 12/25/2023) for follow up .    Karie Georges, MD

## 2023-07-01 ENCOUNTER — Encounter: Payer: Medicare Other | Admitting: Physical Therapy

## 2023-07-03 ENCOUNTER — Encounter: Payer: Medicare Other | Admitting: Physical Therapy

## 2023-07-04 DIAGNOSIS — E785 Hyperlipidemia, unspecified: Secondary | ICD-10-CM | POA: Insufficient documentation

## 2023-07-04 NOTE — Assessment & Plan Note (Signed)
Getting annual labs today, ordered A1C, not on any medications for this.

## 2023-07-04 NOTE — Assessment & Plan Note (Signed)
Pt is wanting to wean herself off of the requip, states she does not think this is working for her restless leg. Gave her verbal instructions to reduce by 1/2 tablet every few nights until she is off the medication.

## 2023-07-04 NOTE — Assessment & Plan Note (Signed)
Pt needs new referral to a different psychiatrist. Will place order.

## 2023-07-16 ENCOUNTER — Ambulatory Visit: Payer: Medicare Other | Admitting: Pulmonary Disease

## 2023-07-16 ENCOUNTER — Encounter: Payer: Self-pay | Admitting: Pulmonary Disease

## 2023-07-16 VITALS — BP 114/71 | HR 73 | Ht 70.0 in | Wt 243.0 lb

## 2023-07-16 DIAGNOSIS — J449 Chronic obstructive pulmonary disease, unspecified: Secondary | ICD-10-CM | POA: Diagnosis not present

## 2023-07-16 MED ORDER — PREDNISONE 10 MG PO TABS: 40.0000 mg | ORAL_TABLET | Freq: Every day | ORAL | 0 refills | Status: AC

## 2023-07-16 NOTE — Patient Instructions (Signed)
VISIT SUMMARY:  During your visit, we discussed your recent increase in shortness of breath, despite your stable oxygen saturation. We also talked about your recent weight gain and changes in your sleep pattern. You mentioned that you recently started taking Prozac and stopped taking Ropinirole. We also discussed your upcoming vacation plans and the need for a follow-up appointment before your cruise.  YOUR PLAN:  -COPD: COPD, or Chronic Obstructive Pulmonary Disease, is a lung disease that makes it hard to breathe. We have ordered Prednisone for 3 days to see if your symptoms improve. We also scheduled a CT scan for next week to further evaluate your condition.  -WEIGHT GAIN: You've gained 12 pounds recently, which might be linked to your increased appetite, particularly in the evenings. This could be related to the Prozac you recently started taking.  -RESTLESS LEG SYNDROME: Restless Leg Syndrome is a condition that causes an uncontrollable urge to move your legs. You recently stopped taking Ropinirole for this condition, and we have no changes to your treatment plan at this time.  INSTRUCTIONS:  We have scheduled an annual CT scan for next week. We also discussed safety measures and precautions for your plan to snorkel during your upcoming vacation. Please schedule a follow-up appointment before your December 7th cruise to ensure you are stable for travel.

## 2023-07-16 NOTE — Progress Notes (Signed)
Judy [name]    [id number]    09-02-1951  Primary Care Physician:Michael, [name], MD  Referring Physician: Karie Georges, MD [address] [place],  [place] [address]  Chief complaint: Follow up for COPD  HPI: 72 y.o. with anxiety, PTSD, bipolar, COPD, asthma  Complains of dyspnea on exertion for the past 1 year.  She has symptoms on exertion, mild symptoms at rest, daily cough with clear mucus, sinus drainage.  She has had multiple hospitalizations over the past year for COPD exacerbations.  Given inhalers including Advair and Symbicort but does not feel it is helping.  Previously evaluated by pulmonary at [facility] and is here for second opinion.  Hospitalized in July 2018 for COPD exacerbation Hospitalized in November 2018 for COPD exacerbation, CT during both admissions were is negative for PE but showed bilateral groundglass opacities.  She was treated with antibiotics, oral prednisone.  Virus panel was positive for adenovirus at that time.  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  She did have a COPD exacerbation from viral illness while in rehab and was treated with antibiotics and prednisone.  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 Smoking history: 50-pack-year smoker.  Quit smoking in early 2022 Travel history: Originally  from [place].  No significant recent travel Relevant family history: Daughter has asthma.  No other significant family history of lung disease.  Interim history: Discussed the use of AI scribe software for clinical note transcription with the patient, who gave verbal consent to proceed.  The patient, with a history of COPD reports a recent increase in shortness of breath. She was previously doing well, with noticeable improvement in breathing  and posture. However, three weeks ago, she began experiencing shortness of breath again. She denies any recent illness or exposure to triggers. She is currently on Trelegy for COPD management and reports dissatisfaction with previous Breztri therapy. She has also been participating in rehab for her knee replacement, but reports no improvement in strength.  In addition to her respiratory symptoms, the patient reports a recent weight gain of twelve pounds due to increased appetite and frequent snacking, particularly in the evenings. She also reports changes in her sleep pattern, with difficulty staying asleep throughout the night. She recently started Prozac, which was increased in dosage two weeks ago. She also discontinued ropinirole, previously taken for restless leg syndrome.   Outpatient Encounter Medications as of 07/16/2023  Medication Sig   atorvastatin (LIPITOR) 80 MG tablet Take 1 tablet (80 mg total) by mouth daily.   Biotin 30865 MCG TABS Take by mouth.   Calcium-Phosphorus-Vitamin D (CITRACAL +D3 PO) Take 1 tablet by mouth daily. Calcium 650 mg & vitamin D 1000 units   Cholecalciferol (VITAMIN D3) 50 MCG (2000 UT) TABS Take 1 tablet by mouth in the morning.   Cyanocobalamin (VITAMIN B-12 PO) Take 2,500 mcg by mouth in the morning.   diclofenac (VOLTAREN) 75 MG EC tablet Take 75 mg by mouth 2 (two) times daily.   FLUoxetine (PROZAC) 20 MG capsule Take 20 mg by mouth daily.   Fluticasone-Umeclidin-Vilant (TRELEGY ELLIPTA) 100-62.5-25 MCG/ACT AEPB Inhale 1 puff into the lungs daily.   levalbuterol (XOPENEX HFA) 45 MCG/ACT inhaler Inhale 1 puff into the lungs every 6 (six) hours as needed for  wheezing or shortness of breath.   LORazepam (ATIVAN) 1 MG tablet Take 1 mg by mouth as needed.   Misc Natural Products (NEURIVA) CAPS Take by mouth.   QUEtiapine (SEROQUEL) 400 MG tablet Take 800 mg by mouth at bedtime.   traMADol (ULTRAM) 50 MG tablet Take 1 tablet (50 mg total) by mouth every 8  (eight) hours as needed for moderate pain or severe pain.   [DISCONTINUED] FLUoxetine (PROZAC) 10 MG tablet Take 10 mg by mouth daily. (Patient not taking: Reported on 07/16/2023)   [DISCONTINUED] LORazepam (ATIVAN) 0.5 MG tablet Take 1.5 mg by mouth at bedtime. (Patient not taking: Reported on 07/16/2023)   [DISCONTINUED] rOPINIRole (REQUIP) 1 MG tablet TAKE 1/2 (ONE-HALF) TABLET BY MOUTH IN THE MORNING AND 2 AT BEDTIME   No facility-administered encounter medications on file as of 07/16/2023.   Physical Exam: Blood pressure 114/71, pulse 73, height 5\' 10"  (1.778 m), weight 243 lb (110.2 kg), SpO2 95%. Gen:      No acute distress HEENT:  EOMI, sclera anicteric Neck:     No masses; no thyromegaly Lungs:    Clear to auscultation bilaterally; normal respiratory effort CV:         Regular rate and rhythm; no murmurs Abd:      + bowel sounds; soft, non-tender; no palpable masses, no distension Ext:    No edema; adequate peripheral perfusion Skin:      Warm and dry; no rash Neuro: alert and oriented x 3 Psych: normal mood and affect   Data Reviewed: Imaging: CTA 04/15/2017-no pulmonary embolism, patchy upper lobe groundglass opacities CTA 08/23/2018-no PE, patchy bilateral groundglass opacities, predominantly in the bases. High-resolution CT 08/14/2018-no ILD, bronchial wall thickening with atherosclerosis, small pulmonary nodules Screening CT chest 11/01/2020-mild emphysema, stable pulmonary nodules. Chest x-ray 02/03/22 chronic appearing interstitial markings Screening CT chest 07/17/2022-mild emphysema, faint chronic patchy tree-in-bud opacities I have reviewed the images personally.  PFTs: Spirometry 05/18/18 FEV1 1.76, F/F 65.  Moderate obstruction.  08/10/18 FVC 3.31 [94%), FEV1 2.25 [83%), F/F 68, TLC 83%, DLCO 61% Mild obstruction with moderate diffusion defect.  FENO 08/05/2018-8  Labs: CBC 08/25/2017-WBC 9.6, eos 0% CBC 08/05/2018- WBC 9.1, eos 0.6%, absolute eosinophil count  55  IgE 08/10/2018-79 Alpha-1 antitrypsin 08/10/2018-192, PI MM  Assessment:  COPD PFTs reviewed with mild obstruction, no bronchodilator response.  Labs show normal IgE and low peripheral eosinophils. She has a diagnosis of asthma but this looks more like COPD.  Recent increase in shortness of breath despite stable oxygen saturation. No wheezing on exam. Currently on Trelegy. -Order Prednisone 40mg  for 3 days to see if symptoms improve. -CT scan scheduled for next week to further evaluate.  Abnormal CT, nodules Prior CTs noted with patchy bilateral groundglass opacities.  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  Follow-up high res CT does not show interstitial lung disease. She does have some mild tree-in-bud opacities suggestive of chronic MAI infection but is not able to produce sputum.  Will continue to monitor this.  Weight Gain Recent 12-pound weight gain over the past month. Increased appetite, particularly in the evenings. Recently started Prozac. -Discussed potential link between Prozac and increased appetite.  Restless Leg Syndrome Recently stopped Ropinirole with primary care provider. -No changes to plan at this time.  General Health Maintenance -Annual CT scan scheduled for next week. -Plan to snorkel during upcoming vacation. Discussed safety measures and precautions. -Schedule follow-up appointment before December  7th cruise to ensure patient is stable for travel.  08/27/2016-Pneumovax Does not want flu, RSV or COVID vaccination.  Plan/Recommendations: - Continue trelegy - Prednisone 40 mg for 3 days - Follow Low-dose screening CT chest  Chilton Greathouse MD [facility] 07/16/2023, 3:15 PM  CC: Karie Georges, MD

## 2023-07-21 ENCOUNTER — Ambulatory Visit
Admission: RE | Admit: 2023-07-21 | Discharge: 2023-07-21 | Disposition: A | Discharge status: Home / Self Care | Payer: Medicare Other | Source: Ambulatory Visit | Attending: Family Medicine | Admitting: Family Medicine

## 2023-07-21 DIAGNOSIS — Z122 Encounter for screening for malignant neoplasm of respiratory organs: Secondary | ICD-10-CM

## 2023-07-21 DIAGNOSIS — Z87891 Personal history of nicotine dependence: Secondary | ICD-10-CM

## 2023-08-08 ENCOUNTER — Telehealth: Payer: Self-pay | Admitting: Acute Care

## 2023-08-08 ENCOUNTER — Ambulatory Visit
Admission: RE | Admit: 2023-08-08 | Discharge: 2023-08-08 | Disposition: A | Discharge status: Home / Self Care | Payer: Medicare Other | Source: Ambulatory Visit | Attending: Nurse Practitioner | Admitting: Nurse Practitioner

## 2023-08-08 ENCOUNTER — Other Ambulatory Visit: Payer: Self-pay | Admitting: Nurse Practitioner

## 2023-08-08 ENCOUNTER — Other Ambulatory Visit: Payer: Self-pay | Admitting: Acute Care

## 2023-08-08 DIAGNOSIS — R918 Other nonspecific abnormal finding of lung field: Secondary | ICD-10-CM

## 2023-08-08 DIAGNOSIS — R52 Pain, unspecified: Secondary | ICD-10-CM

## 2023-08-08 NOTE — Telephone Encounter (Signed)
Call Report  

## 2023-08-08 NOTE — Telephone Encounter (Signed)
I have called the patient with the results of her low dose CT Chest. The scan was read as a LR 0, she states she has not been sick. No fever, no discolored secretions , no body aches. I explained that the scan showed Diffuse nodular and amorphous peripheral ground-glass attenuation in both lungs, new in the interval. This is probably infectious/inflammatory and include some dominant nodular components on today's study. Repeat lung cancer screening chest CT after therapy if recommended.  As patient is not sick, we will repeat the scan in 6 weeks to re-evaluate. I have placed the order for 09/22/2023. Pt. Understands she will get a call to get it scheduled closer to the time it is due. Please fax results to PCP and let them know plan is for a 6 week follow up.  Thanks so much

## 2023-08-08 NOTE — Telephone Encounter (Signed)
Call report received:  IMPRESSION: 1. Lung-RADS 0, incomplete. Diffuse nodular and amorphous peripheral ground-glass attenuation in both lungs, new in the interval. This is probably infectious/inflammatory and include some dominant nodular components on today's study. Repeat months cancer screening chest CT after therapy recommended to further evaluate. Additional lung cancer screening CT images/or comparison to prior chest CT examinations is needed. 2.  Emphysema (ICD10-J43.9) and Aortic Atherosclerosis (ICD10-170.0)

## 2023-08-11 NOTE — Telephone Encounter (Signed)
Results/plan faxed to PCP 

## 2023-08-19 ENCOUNTER — Ambulatory Visit (INDEPENDENT_AMBULATORY_CARE_PROVIDER_SITE_OTHER): Payer: Medicare Other | Admitting: Family Medicine

## 2023-08-19 ENCOUNTER — Encounter: Payer: Self-pay | Admitting: Family Medicine

## 2023-08-19 VITALS — BP 112/70 | HR 63 | Temp 97.6°F | Ht 70.0 in | Wt 246.6 lb

## 2023-08-19 DIAGNOSIS — Z7984 Long term (current) use of oral hypoglycemic drugs: Secondary | ICD-10-CM | POA: Diagnosis not present

## 2023-08-19 DIAGNOSIS — H60542 Acute eczematoid otitis externa, left ear: Secondary | ICD-10-CM | POA: Diagnosis not present

## 2023-08-19 DIAGNOSIS — E119 Type 2 diabetes mellitus without complications: Secondary | ICD-10-CM | POA: Diagnosis not present

## 2023-08-19 MED ORDER — TRIAMCINOLONE ACETONIDE 0.1 % EX CREA
1.0000 | TOPICAL_CREAM | Freq: Two times a day (BID) | CUTANEOUS | 0 refills | Status: DC
Start: 2023-08-19 — End: 2023-12-25

## 2023-08-19 MED ORDER — METFORMIN HCL 500 MG PO TABS
500.0000 mg | ORAL_TABLET | Freq: Two times a day (BID) | ORAL | 1 refills | Status: DC
Start: 2023-08-19 — End: 2023-11-20

## 2023-08-19 NOTE — Patient Instructions (Signed)
Total calories per day: 1700   Carbs: try to keep carbs less than 100 grams per day  Protein: try to get at least 90-100 grams per day

## 2023-08-19 NOTE — Progress Notes (Signed)
Established Patient Office Visit  Subjective   Patient ID: Judy [name], female    DOB: [birth year]  Age: 72 y.o. MRN: [id number]  Chief Complaint  Patient presents with   Obesity    Patient requests to discuss medications for weight loss    Pt is here to discus her weight gain. She reports that she has gained a significant amount of weight over the years, in 2018 she weighed 162 pounds. She has been treated with mutliple different types of psych medication. Please see her current med list. Thinks that it is the medication that has resulted in the weight gain. Counseled patient on thsi extensively.   A1C in sept was 6.6 which is a full diagnosis of diabetes, again this is likely secondary to the psych medications she is taking. Counseled patinet on reducing sugar and starches in her diet. We also discussed adding medication to help control her blood sugars and she is agreeable.     Current Outpatient Medications  Medication Instructions   atorvastatin (LIPITOR) 80 mg, Oral, Daily   Biotin 40981 MCG TABS Oral   Calcium-Phosphorus-Vitamin D (CITRACAL +D3 PO) 1 tablet, Oral, Daily, Calcium 650 mg & vitamin D 1000 units   Cholecalciferol (VITAMIN D3) 50 MCG (2000 UT) TABS 1 tablet, Oral, Every morning   Cyanocobalamin (VITAMIN B-12 PO) 2,500 mcg, Oral, Every morning   diclofenac (VOLTAREN) 75 mg, Oral, 2 times daily   FLUoxetine (PROZAC) 20 mg, Oral, Daily   Fluticasone-Umeclidin-Vilant (TRELEGY ELLIPTA) 100-62.5-25 MCG/ACT AEPB 1 puff, Inhalation, Daily   levalbuterol (XOPENEX HFA) 45 MCG/ACT inhaler 1 puff, Inhalation, Every 6 hours PRN   LORazepam (ATIVAN) 1 mg, Oral, As needed   metFORMIN (GLUCOPHAGE) 500 mg, Oral, 2 times daily with meals   Misc Natural Products (NEURIVA) CAPS Oral   QUEtiapine (SEROQUEL) 800 mg, Oral, Daily at bedtime   traMADol (ULTRAM) 50 mg, Oral, Every 8 hours PRN   traZODone (DESYREL) 150 mg, Oral, Daily at bedtime   triamcinolone cream (KENALOG) 0.1  % 1 Application, Topical, 2 times daily    Patient Active Problem List   Diagnosis Date Noted   Hyperlipidemia 07/04/2023   Diabetes mellitus without complication (HCC) 08/09/2022   Restless legs syndrome 08/09/2022   Status post total left knee replacement 12/28/2021   Primary osteoarthritis of left knee 10/25/2021   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) 11/30/2020   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     Objective:     BP 112/70 (BP Location: Left Arm, Patient Position: Sitting, Cuff Size: Large)   Pulse 63   Temp 97.6 F (36.4 C) (Oral)   Ht 5\' 10"  (1.778 m)   Wt 246 lb 9.6 oz (111.9 kg)   SpO2 95%   BMI 35.38 kg/m    Physical Exam Vitals reviewed.  Constitutional:      Appearance: She is obese.  Eyes:     Conjunctiva/sclera: Conjunctivae normal.  Pulmonary:     Effort: Pulmonary effort is normal.  Skin:    General: Skin is warm and dry.  Neurological:     Mental Status: She is  alert and oriented to person, place, and time. Mental status is at baseline.  Psychiatric:        Mood and Affect: Mood normal.        Behavior: Behavior normal.      No results found for any visits on 08/19/23.    The ASCVD Risk score (Arnett DK, et al., 2019) failed to calculate for the following reasons:   The valid total cholesterol range is 130 to 320 mg/dL    Assessment & Plan:  Diabetes mellitus without complication (HCC) Assessment & Plan: A1C is worsening with weight gain, will add metformin BID and see her back in 3 months for repeat A1C, risks/benefits of medication discussed with patient.   Orders: -      metFORMIN HCl; Take 1 tablet (500 mg total) by mouth 2 (two) times daily with a meal.  Dispense: 180 tablet; Refill: 1  Eczema of left external ear -     Triamcinolone Acetonide; Apply 1 Application topically 2 (two) times daily.  Dispense: 30 g; Refill: 0     Return in about 3 months (around 11/19/2023) for DM - A1C recheck.    Karie Georges, MD

## 2023-08-22 NOTE — Assessment & Plan Note (Signed)
A1C is worsening with weight gain, will add metformin BID and see her back in 3 months for repeat A1C, risks/benefits of medication discussed with patient.

## 2023-08-24 ENCOUNTER — Other Ambulatory Visit: Payer: Self-pay | Admitting: Interventional Cardiology

## 2023-08-27 ENCOUNTER — Other Ambulatory Visit: Payer: Self-pay

## 2023-08-27 ENCOUNTER — Telehealth: Payer: Self-pay

## 2023-08-27 NOTE — Progress Notes (Unsigned)
Office Visit Note  [name]             Date of Birth: [birth year]           MRN: [id number]             PCP: Karie Georges, MD Referring: Karie Georges, MD Visit Date: 09/10/2023 Occupation: @GUAROCC @  Subjective:  Muscle cramping   History of Present Illness: HIBO FETTE is a 72 y.o. female with history of ankylosing spondylitis.   Patient has not really taking immunosuppressive agents.  She denies any new or worsening symptoms since discontinuing Cimzia.  She continues to have chronic stiffness in her neck, thoracic spine, lumbar spine but has not had any increased pain.  She has noticed increased cramping in both hands as well as cramping in her legs at night.  She has chronic swelling in her left knee replacement but is no longer having to use a cane.  She has been taking diclofenac 75 mg 1 tablet twice daily for pain relief.  She does not find tramadol to be overly effective for managing her pain levels.    Activities of Daily Living:  Patient reports morning stiffness for 10-15 minutes.   Patient Reports nocturnal pain.  Difficulty dressing/grooming: Reports Difficulty climbing stairs: Reports Difficulty getting out of chair: Denies Difficulty using hands for taps, buttons, cutlery, and/or writing: Denies  Review of Systems  Constitutional:  Positive for fatigue.  HENT:  Positive for mouth dryness. Negative for mouth sores.   Eyes:  Negative for dryness.  Respiratory:  Positive for shortness of breath.   Cardiovascular:  Negative for chest pain and palpitations.  Gastrointestinal:  Negative for blood in stool, constipation and diarrhea.  Endocrine: Negative for increased urination.  Genitourinary:  Positive for involuntary urination.  Musculoskeletal:  Positive for joint pain, joint pain, joint swelling, myalgias, muscle weakness, morning stiffness and myalgias. Negative for gait problem and muscle tenderness.  Skin:  Negative for color  change, rash, hair loss and sensitivity to sunlight.  Allergic/Immunologic: Negative for susceptible to infections.  Neurological:  Negative for dizziness and headaches.  Hematological:  Negative for swollen glands.  Psychiatric/Behavioral:  Positive for depressed mood and sleep disturbance. The patient is nervous/anxious.     PMFS History:  Patient Active Problem List   Diagnosis Date Noted   Hyperlipidemia 07/04/2023   Diabetes mellitus without complication (HCC) 08/09/2022   Restless legs syndrome 08/09/2022   Status post total left knee replacement 12/28/2021   Primary osteoarthritis of left knee 10/25/2021   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) 11/30/2020   Status post total replacement of right hip 06/04/2019   Status post total replacement of left hip 03/19/2019   PTSD (post-traumatic stress disorder)    COPD (chronic obstructive pulmonary disease) (HCC)    Bipolar 1 disorder (HCC)    Anxiety    B12 deficiency 12/09/2018   Hyperglycemia 04/14/2017    Past Medical History:  Diagnosis Date   Anxiety    Arthritis    Avascular necrosis (HCC)    Left hip   Avascular necrosis of bone of hip, 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   Bipolar 1 disorder (HCC)    COPD (chronic obstructive pulmonary disease) (HCC)    Depression    Dyspnea    Pneumonia    PTSD (post-traumatic stress disorder)     Family History  Problem Relation Age of Onset  CAD Mother 1   COPD Mother    Depression Mother    Heart attack Father    Colon polyps Sister    Cancer Maternal Aunt        growth on face- underwent chemo   Heart attack Maternal Grandmother    Early death Maternal Grandfather    Hearing loss Paternal Grandfather    Asthma Daughter    Bipolar disorder Son    Past Surgical History:  Procedure Laterality Date   bone spurs foot Right    CATARACT EXTRACTION Bilateral    COLONOSCOPY     COLONOSCOPY  07/2022   HAND  SURGERY Left    JOINT REPLACEMENT     TONSILLECTOMY     TONSILLECTOMY AND ADENOIDECTOMY     TOTAL HIP ARTHROPLASTY Left 03/19/2019   Procedure: LEFT TOTAL HIP ARTHROPLASTY ANTERIOR APPROACH;  Surgeon: [name], MD;  Location: [facility];  Service: Orthopedics;  Laterality: Left;   TOTAL HIP ARTHROPLASTY Right 06/04/2019   Procedure: RIGHT TOTAL HIP ARTHROPLASTY ANTERIOR APPROACH;  Surgeon: [name], MD;  Location: [facility];  Service: Orthopedics;  Laterality: Right;   TOTAL KNEE ARTHROPLASTY Left 12/28/2021   Procedure: Left TOTAL KNEE ARTHROPLASTY;  Surgeon: [name], MD;  Location: [facility];  Service: Orthopedics;  Laterality: Left;   Social History   Social History Narrative   Not on file   Immunization History  Administered Date(s) Administered   Influenza, High Dose Seasonal PF 08/25/2018   Moderna Sars-Covid-2 Vaccination 11/08/2019, 12/05/2019, 10/09/2020   Pneumococcal Conjugate-13 09/23/2018   Pneumococcal Polysaccharide-23 08/27/2016   Tdap 10/07/2012   Yellow Fever 07/05/2008     Objective: Vital Signs: BP 134/79 (BP Location: Left Arm, Patient Position: Sitting, Cuff Size: Normal)   Pulse 74   Resp 16   Ht 5\' 10"  (1.778 m)   Wt 245 lb 12.8 oz (111.5 kg)   BMI 35.27 kg/m    Physical Exam Vitals and nursing note reviewed.  Constitutional:      Appearance: She is well-developed.  HENT:     Head: Normocephalic and atraumatic.  Eyes:     Conjunctiva/sclera: Conjunctivae normal.  Cardiovascular:     Rate and Rhythm: Normal rate and regular rhythm.     Heart sounds: Normal heart sounds.  Pulmonary:     Effort: Pulmonary effort is normal.     Breath sounds: Normal breath sounds.  Abdominal:     General: Bowel sounds are normal.     Palpations: Abdomen is soft.  Musculoskeletal:     Cervical back: Normal range of motion.  Lymphadenopathy:     Cervical: No cervical adenopathy.  Skin:    General: Skin is warm and dry.      Capillary Refill: Capillary refill takes less than 2 seconds.  Neurological:     Mental Status: She is alert and oriented to person, place, and time.  Psychiatric:        Behavior: Behavior normal.      Musculoskeletal Exam: Patient mains seated during examination today.  C-spine has severely limited range of motion.  Thoracic kyphosis noted.  Limited mobility of the thoracic and lumbar spine.  Shoulder joints have good range of motion.  Flexion contractures of both elbows.  Wrist joints, MCPs, PIPs, DIPs have good range of motion with no synovitis.  Complete fist formation bilaterally.  Hip replacements difficult to assess in seated position.  Left knee replacement has slightly limited extension with warmth but no effusion.  Right  knee joint has good range of motion with no warmth or effusion.  Ankle joints have good range of motion with no tenderness or joint swelling.  No evidence of Achilles tendinitis or plantar fasciitis.  CDAI Exam: CDAI Score: -- Patient Global: --; Provider Global: -- Swollen: --; Tender: -- Joint Exam 09/10/2023   No joint exam has been documented for this visit   There is currently no information documented on the homunculus. Go to the Rheumatology activity and complete the homunculus joint exam.  Investigation: No additional findings.  Imaging: No results found.  Recent Labs: Lab Results  Component Value Date   WBC 5.1 01/08/2023   HGB 12.1 01/08/2023   PLT 229 01/08/2023   NA 137 01/08/2023   K 4.6 01/08/2023   CL 105 01/08/2023   CO2 22 01/08/2023   GLUCOSE 99 01/08/2023   BUN 18 01/08/2023   CREATININE 1.27 (H) 01/08/2023   BILITOT 0.4 06/27/2023   ALKPHOS 190 (H) 06/27/2023   AST 17 06/27/2023   ALT 20 06/27/2023   PROT 6.9 06/27/2023   ALBUMIN 3.9 06/27/2023   CALCIUM 9.7 01/08/2023   GFRAA 54 (L) 04/11/2021   QFTBGOLDPLUS NEGATIVE 04/18/2022    Speciality Comments: Cimzia completed loading on June 22, 2020  Procedures:  No  procedures performed Allergies: Celexa [citalopram hydrobromide] and Gabapentin   Assessment / Plan:     Visit Diagnoses: 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 [name]) - C-spine, thoracic spine, and lumbar spine fused: Last dose of Cimzia was administered on 03/13/2023: She has not had any signs or symptoms of a flare despite not being on any immunosuppressive agents at this time.  She continues to have significant stiffness involving the C-spine, thoracic spine, lumbar spine but she has not noticed any increased joint pain.  She has been taking diclofenac 75 mg 1 tablet twice daily and tramadol 50 mg 1 tablet every 8 hours for pain relief. She has no evidence of Achilles tendinitis or plantar fasciitis.  No SI joint pain currently.  No synovitis or dactylitis noted.  No signs or symptoms of uveitis.  She has not noticed any new or worsening symptoms since discontinuing Cimzia.  She does not notice any improvement in her pain levels while taking prednisone.  No medication changes will be made at this time.  She is advised to notify us if she develops signs or symptoms of a flare.  She will follow-up in the office in 6 months or sooner if needed.  High risk medication use - Last dose of Cimzia was administered on 03/13/2023.  Previously reinitiated Cimzia on 04/18/2022.  She is taking diclofenac 75 mg 1 tablet twice daily for pain relief.  Plan to update CMP with GFR today.- Plan: COMPLETE METABOLIC PANEL WITH GFR  Muscle cramping -Patient has been experiencing increased muscle cramping in her hands and lower extremities.  Plan to check CMP and magnesium today. She may benefit from taking a magnesium supplement at bedtime.  Plan: COMPLETE METABOLIC PANEL WITH GFR, Magnesium  Contracture of joint of both elbows: Flexion contractures--unchanged.  Bilateral hand pain - Ultrasound of both hands performed on 10/24/2022 which revealed left second digit tenosynovitis.  She has been experiencing increased  hand cramping recently.  No synovitis or dactylitis noted on examination today.  Plan to check potassium and magnesium levels today.  Discussed that she may benefit from taking magnesium at bedtime since she is been experiencing increased hand cramping as well as cramping in her lower extremities  at night.  Degeneration of intervertebral disc of lumbar region without discogenic back pain or lower extremity pain: Chronic pain and stiffness.  Under the care of pain management.  Spinal stenosis of lumbosacral region - Recent facet joint injection performed on 12/02/2022.  Chronic pain and stiffness.  Chronic SI joint pain: No SI joint tenderness at this time.  Status post total replacement of both hips - Hx of avascular necrosis  S/P total knee arthroplasty, left - Dr. Blackman-12/28/21. Chronic pain and stiffness.  She is no longer using a cane.  Patient continues to go to physical therapy.   Other medical conditions are listed as follows:   Bipolar 1 disorder (HCC) -Patient requested to have TSH checked today plan: TSH  PTSD (post-traumatic stress disorder) - Patient requested to have TSH rechecked today. Plan: TSH  Thyroid disorder screening -TSH rechecked today . Plan: TSH  History of anxiety  B12 deficiency  Pulmonary nodules  History of COPD  Former smoker     Orders: Orders Placed This Encounter  Procedures   COMPLETE METABOLIC PANEL WITH GFR   Magnesium   TSH   No orders of the defined types were placed in this encounter.   Follow-Up Instructions: Return in about 6 months (around 03/10/2024) for Ankylosing Spondylitis.   Gearldine Bienenstock, PA-C  Note - This record has been created using Dragon software.  Chart creation errors have been sought, but may not always  have been located. Such creation errors do not reflect on  the standard of medical care.

## 2023-08-27 NOTE — Patient Outreach (Signed)
  Care Management   Visit Note  08/27/2023 Name: Judy [name] MRN: [id number] DOB: Nov 07, 1950  Subjective: Judy [name] is a 72 y.o. year old female who is a primary care patient of Karie Georges, MD. The Care Management team was consulted for assistance.      Call returned from Judy [name]. Anticipates being available to complete care management outreach on 09/26/23. Agreed to call if assistance is needed prior to scheduled outreach.   [name]  [facility], [facility] RN Care Manager Direct Dial: [phone] Website: [email or web address]

## 2023-08-27 NOTE — Care Management (Signed)
Error: Duplicate encounter-Please Disregard

## 2023-08-27 NOTE — Patient Outreach (Signed)
  Care Management   Outreach Note  08/27/2023 Name: Judy [name] MRN: [id number] DOB: [birth year]  An unsuccessful telephone outreach was attempted today to contact the patient about Care Management needs.    Follow Up Plan:  A HIPAA compliant phone message was left for the patient providing contact information and requesting a return call.    [facility]  [facility], [facility] RN Care Manager Direct Dial: [phone] Website: [email or web address]

## 2023-08-29 ENCOUNTER — Other Ambulatory Visit: Payer: Self-pay | Admitting: Pulmonary Disease

## 2023-09-09 ENCOUNTER — Encounter: Payer: Self-pay | Admitting: Primary Care

## 2023-09-09 ENCOUNTER — Ambulatory Visit: Payer: Medicare Other | Admitting: Primary Care

## 2023-09-09 VITALS — BP 137/81 | HR 72 | Temp 97.3°F | Ht 70.0 in | Wt 245.0 lb

## 2023-09-09 DIAGNOSIS — R918 Other nonspecific abnormal finding of lung field: Secondary | ICD-10-CM

## 2023-09-09 DIAGNOSIS — J449 Chronic obstructive pulmonary disease, unspecified: Secondary | ICD-10-CM | POA: Diagnosis not present

## 2023-09-09 MED ORDER — PREDNISONE 10 MG PO TABS: ORAL_TABLET | ORAL | 0 refills | Status: DC

## 2023-09-09 MED ORDER — AMOXICILLIN-POT CLAVULANATE 875-125 MG PO TABS: 1.0000 | ORAL_TABLET | Freq: Two times a day (BID) | ORAL | 0 refills | Status: DC

## 2023-09-09 MED ORDER — LEVALBUTEROL TARTRATE 45 MCG/ACT IN AERO: 1.0000 | INHALATION_SPRAY | Freq: Four times a day (QID) | RESPIRATORY_TRACT | 3 refills | Status: DC | PRN

## 2023-09-09 NOTE — Progress Notes (Signed)
@Patient  ID: Judy [name], female    DOB: 19-Mar-1951, 72 y.o.   MRN: [id number]  No chief complaint on file.   Referring provider: Karie Georges, MD  HPI:  72 y.o. with anxiety, PTSD, bipolar, COPD, asthma  Complains of dyspnea on exertion for the past 1 year.  She has symptoms on exertion, mild symptoms at rest, daily cough with clear mucus, sinus drainage.  She has had multiple hospitalizations over the past year for COPD exacerbations.  Given inhalers including Advair and Symbicort but does not feel it is helping.  Previously evaluated by pulmonary at [facility] and is here for second opinion.  Hospitalized in July 2018 for COPD exacerbation Hospitalized in November 2018 for COPD exacerbation, CT during both admissions were is negative for PE but showed bilateral groundglass opacities.  She was treated with antibiotics, oral prednisone.  Virus panel was positive for adenovirus at that time.  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  She did have a COPD exacerbation from viral illness while in rehab and was treated with antibiotics and prednisone.  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 Smoking history: 50-pack-year smoker.  Quit smoking in early 2022 Travel history: Originally  from [place].  No significant recent travel Relevant family history: Daughter has asthma.  No other significant family history of lung disease.  Interim history: Discussed the use of AI scribe software for clinical note transcription with the patient, who gave verbal consent to proceed.  The patient, with a history of COPD reports a recent increase in shortness of breath. She was previously doing well, with noticeable improvement in breathing and posture. However, three weeks ago, she began experiencing shortness of  breath again. She denies any recent illness or exposure to triggers. She is currently on Trelegy for COPD management and reports dissatisfaction with previous Breztri therapy. She has also been participating in rehab for her knee replacement, but reports no improvement in strength.  In addition to her respiratory symptoms, the patient reports a recent weight gain of twelve pounds due to increased appetite and frequent snacking, particularly in the evenings. She also reports changes in her sleep pattern, with difficulty staying asleep throughout the night. She recently started Prozac, which was increased in dosage two weeks ago. She also discontinued ropinirole, previously taken for restless leg syndrome.    Assessment:  COPD PFTs reviewed with mild obstruction, no bronchodilator response.  Labs show normal IgE and low peripheral eosinophils. She has a diagnosis of asthma but this looks more like COPD.  Recent increase in shortness of breath despite stable oxygen saturation. No wheezing on exam. Currently on Trelegy. -Order Prednisone 40mg  for 3 days to see if symptoms improve. -CT scan scheduled for next week to further evaluate.  Abnormal CT, nodules Prior CTs noted with patchy bilateral groundglass opacities.  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  Follow-up high res CT does not show interstitial lung disease. She does have some mild tree-in-bud opacities suggestive of chronic MAI infection but is not able to produce sputum.  Will continue to monitor this.  Restless Leg Syndrome Recently stopped Ropinirole with primary care provider. -No changes to plan at this time.  General Health Maintenance -Annual CT scan scheduled for next week.  08/27/2016-Pneumovax Does not want flu, RSV or COVID vaccination.  Plan/Recommendations: - Continue trelegy - Prednisone 40 mg for 3 days - Follow  Low-dose screening CT chest   09/09/2023- Interim hx  Discussed the use of AI scribe software for clinical note transcription with the patient, who gave verbal consent to proceed.  Patient of Dr. Isaiah Serge, last seen on 07/16/23. Pulmonary function testing showed mild obstruction without bronchodilator response.  She has a diagnosis of asthma which appears more consistent with COPD.  She is currently maintained on Trelegy.  She was given prednisone course in October by Dr. Isaiah Serge.  Prior CTs noted with patchy groundglass opacities.  Follow-up high-resolution CAT scan did not show interstitial lung disease.  She did have some mild tree-in-bud opacities suggestive of chronic MAI but has been unable to produce sputum.  Plan to continue to monitor. Needs sputum culture if able.    She had LDT on 07/21/23 which showed Lung RADS 0, incomplete. Diffuse nodule and amorphous peripheral ground glass attenuation in both lungs, new. Likely infectious or inflammatory. Due for repeat imaging in 2 months.   Patient presents today for 6-week follow-up COPD/pulmonary nodules to ensure safety/stability for travel this December.  Plans to snorkel during upcoming vacation.  Trelegy inhaler seems to work well for her. She had a lingering cough, which was resolved with a short course of prednisone. The patient reports a daily cough, often followed by sneezing, which has been somewhat disruptive to her quality of life. She does not currently take an antihistamine. The cough produces a scant amount of light yellow mucus. The patient has been unable to produce a sputum sample for further testing. The patient has a history of bronchitis, requiring prescription treatment two to three times a year.  The patient is planning to go on a cruise next week, including snorkeling, and has no current infectious symptoms or fever. She has a rescue inhaler (albuterol) in addition to her daily Trelegy. She has a history of anxiety, particularly  around water, but once in the water, she is comfortable. She has ordered a new full-face snorkel mask with a two-way breathing apparatus to prevent water intake.  The patient also reports occasional nosebleeds, which may be related to dryness. She does not currently use any nasal sprays or gels.   Allergies  Allergen Reactions   Celexa [Citalopram Hydrobromide]     "goes weird"   Gabapentin     "goes weird"  Other reaction(s): hall    Immunization History  Administered Date(s) Administered   Influenza, High Dose Seasonal PF 08/25/2018   Moderna Sars-Covid-2 Vaccination 11/08/2019, 12/05/2019, 10/09/2020   Pneumococcal Conjugate-13 09/23/2018   Pneumococcal Polysaccharide-23 08/27/2016   Tdap 10/07/2012   Yellow Fever 07/05/2008    Past Medical History:  Diagnosis Date   Anxiety    Arthritis    Avascular necrosis (HCC)    Left hip   Avascular necrosis of bone of hip, 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   Bipolar 1 disorder (HCC)    COPD (chronic obstructive pulmonary disease) (HCC)    Depression    Dyspnea    Pneumonia    PTSD (post-traumatic stress disorder)     Tobacco History: Social History   Tobacco Use  Smoking Status Former   Current packs/day: 0.00   Average packs/day: 1.3 packs/day for 43.0 years (53.8 ttl pk-yrs)   Types: Cigarettes   Start date: 05/19/1977   Quit date: 05/19/2020  Years since quitting: 3.3   Passive exposure: Never  Smokeless Tobacco Never   Counseling given: Not Answered   Outpatient Medications Prior to Visit  Medication Sig Dispense Refill   atorvastatin (LIPITOR) 80 MG tablet Take 1 tablet by mouth once daily 90 tablet 0   Biotin 09811 MCG TABS Take by mouth.     Calcium-Phosphorus-Vitamin D (CITRACAL +D3 PO) Take 1 tablet by mouth daily. Calcium 650 mg & vitamin D 1000 units     Cholecalciferol (VITAMIN D3) 50 MCG (2000 UT) TABS Take 1 tablet by mouth in the morning.      Cyanocobalamin (VITAMIN B-12 PO) Take 2,500 mcg by mouth in the morning.     diclofenac (VOLTAREN) 75 MG EC tablet Take 75 mg by mouth 2 (two) times daily.     FLUoxetine (PROZAC) 20 MG capsule Take 20 mg by mouth daily.     levalbuterol (XOPENEX HFA) 45 MCG/ACT inhaler Inhale 1 puff into the lungs every 6 (six) hours as needed for wheezing or shortness of breath. 15 g 11   LORazepam (ATIVAN) 1 MG tablet Take 1 mg by mouth as needed.     metFORMIN (GLUCOPHAGE) 500 MG tablet Take 1 tablet (500 mg total) by mouth 2 (two) times daily with a meal. 180 tablet 1   Misc Natural Products (NEURIVA) CAPS Take by mouth.     QUEtiapine (SEROQUEL) 400 MG tablet Take 800 mg by mouth at bedtime.     traMADol (ULTRAM) 50 MG tablet Take 1 tablet (50 mg total) by mouth every 8 (eight) hours as needed for moderate pain or severe pain. 15 tablet 0   traZODone (DESYREL) 150 MG tablet Take 150 mg by mouth at bedtime.     TRELEGY ELLIPTA 100-62.5-25 MCG/ACT AEPB INHALE 1 PUFF ONCE DAILY 180 each 0   triamcinolone cream (KENALOG) 0.1 % Apply 1 Application topically 2 (two) times daily. 30 g 0   No facility-administered medications prior to visit.      Review of Systems  Review of Systems  Constitutional: Negative.   HENT:  Positive for sneezing.        Nasal dryness   Respiratory:  Positive for cough. Negative for chest tightness, shortness of breath and wheezing.   Cardiovascular: Negative.      Physical Exam  There were no vitals taken for this visit. Physical Exam Constitutional:      General: She is not in acute distress.    Appearance: Normal appearance. She is not ill-appearing.  HENT:     Head: Normocephalic and atraumatic.     Nose: Nose normal.     Mouth/Throat:     Mouth: Mucous membranes are moist.     Pharynx: Oropharynx is clear.  Cardiovascular:     Rate and Rhythm: Normal rate and regular rhythm.  Pulmonary:     Effort: Pulmonary effort is normal. No respiratory distress.      Breath sounds: Wheezing present. No rhonchi.  Musculoskeletal:        General: Normal range of motion.  Skin:    General: Skin is warm and dry.  Neurological:     General: No focal deficit present.     Mental Status: She is alert and oriented to person, place, and time. Mental status is at baseline.  Psychiatric:        Mood and Affect: Mood normal.        Behavior: Behavior normal.        Thought Content: Thought content  normal.        Judgment: Judgment normal.      Lab Results:  CBC    Component Value Date/Time   WBC 5.1 01/08/2023 1541   RBC 4.29 01/08/2023 1541   HGB 12.1 01/08/2023 1541   HCT 36.6 01/08/2023 1541   PLT 229 01/08/2023 1541   MCV 85.3 01/08/2023 1541   MCH 28.2 01/08/2023 1541   MCHC 33.1 01/08/2023 1541   RDW 14.1 01/08/2023 1541   LYMPHSABS 902.7 01/08/2023 1541   MONOABS 0.4 10/30/2020 1227   EOSABS 10 (L) 01/08/2023 1541   BASOSABS 10 01/08/2023 1541    BMET    Component Value Date/Time   NA 137 01/08/2023 1541   K 4.6 01/08/2023 1541   CL 105 01/08/2023 1541   CO2 22 01/08/2023 1541   GLUCOSE 99 01/08/2023 1541   BUN 18 01/08/2023 1541   CREATININE 1.27 (H) 01/08/2023 1541   CALCIUM 9.7 01/08/2023 1541   GFRNONAA >60 12/29/2021 0320   GFRNONAA 47 (L) 04/11/2021 1419   GFRAA 54 (L) 04/11/2021 1419    BNP    Component Value Date/Time   BNP 62.9 08/22/2017 2328    ProBNP No results found for: "PROBNP"  Imaging: No results found.   Assessment & Plan:   1. Chronic obstructive pulmonary disease, unspecified COPD type (HCC)   COPD Stable on Trelegy. Prednisone was effective for a lingering cough. Ground glass opacities on CT scan likely infectious or inflammatory, consistent with prior imaging. No evidence of interstitial lung disease. 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 -Continue Trelegy inhaler. -Attempt to produce sputum sample for culture if possible. -Repeat  CT scan in December.  Allergic Rhinitis Daily coughing and sneezing episodes, causing some interference with quality of life. -Start over-the-counter antihistamine (Zyrtec or Claritin). -Consider saline nasal spray twice daily and Ayr nasal gel for nosebleeds, if no improvement recommend ENT consult.  Travel Plans Upcoming cruise with snorkeling planned. No contraindications noted. -Use albuterol rescue inhaler prior to snorkeling. -Ensure safety measures are in place (snorkeling in shallow water, staying close to boat, using life vest, snorkeling with a partner).  Respiratory Infection Prophylaxis History of 2-3 bronchitis episodes per year. Upcoming travel. -Provide prescription for rescue antibiotic and prednisone to take on trip. -Notify office if medications are used during trip.  Medication Refills Outdated albuterol rescue inhaler. -Refill albuterol rescue inhaler.      Glenford Bayley, NP 09/09/2023

## 2023-09-09 NOTE — Patient Instructions (Addendum)
-  COPD: Chronic Obstructive Pulmonary Disease (COPD) is a long-term lung condition that makes it hard to breathe. Your condition is stable with the Trelegy inhaler, and the prednisone was effective for your lingering cough. The ground glass opacities seen on your CT scan are likely due to an infection or inflammation, similar to previous findings. Please continue using the Trelegy inhaler, try to produce a sputum sample for further testing, and plan for a repeat CT scan in December.  -ALLERGIC RHINITIS: Allergic Rhinitis is an allergic reaction that causes sneezing, coughing, and other similar symptoms. To help manage your daily coughing and sneezing, start taking an over-the-counter antihistamine like Zyrtec or Claritin. Additionally, consider using a saline nasal spray twice daily and Ayr nasal gel to help with your nosebleeds.  -TRAVEL PLANS: We discussed your upcoming cruise and snorkeling plans. There are no contraindications for your activities. Use your albuterol rescue inhaler before snorkeling, ensure you follow safety measures such as snorkeling in shallow water, staying close to the boat, using a life vest, and snorkeling with a partner.  -RESPIRATORY INFECTION PROPHYLAXIS: Given your history of bronchitis, we want to ensure you are prepared for your trip. You will have a prescription for a rescue antibiotic and prednisone to take with you. Please notify our office if you need to use these medications during your trip.  -MEDICATION REFILLS: Your albuterol rescue inhaler is outdated. We have provided a refill for your albuterol rescue inhaler.  INSTRUCTIONS: Please continue using your Trelegy inhaler and attempt to produce a sputum sample for culture if possible. Plan for a repeat CT scan in December. Start taking an over-the-counter antihistamine like Zyrtec or Claritin, and consider using a saline nasal spray twice daily and Ayr nasal gel for your nosebleeds. Use your albuterol rescue inhaler  before snorkeling and follow the recommended safety measures. Take the prescribed rescue antibiotic and prednisone with you on your trip and notify our office if you use them. Refill your albuterol rescue inhaler.  Follow-up: 3-4 months with Dr. Isaiah Serge or sooner if needed

## 2023-09-10 ENCOUNTER — Encounter: Payer: Self-pay | Admitting: Physician Assistant

## 2023-09-10 ENCOUNTER — Ambulatory Visit: Payer: Medicare Other | Attending: Physician Assistant | Admitting: Physician Assistant

## 2023-09-10 VITALS — BP 134/79 | HR 74 | Resp 16 | Ht 70.0 in | Wt 245.8 lb

## 2023-09-10 DIAGNOSIS — M51369 Other intervertebral disc degeneration, lumbar region without mention of lumbar back pain or lower extremity pain: Secondary | ICD-10-CM | POA: Insufficient documentation

## 2023-09-10 DIAGNOSIS — Z96643 Presence of artificial hip joint, bilateral: Secondary | ICD-10-CM | POA: Insufficient documentation

## 2023-09-10 DIAGNOSIS — Z1329 Encounter for screening for other suspected endocrine disorder: Secondary | ICD-10-CM | POA: Insufficient documentation

## 2023-09-10 DIAGNOSIS — Z79899 Other long term (current) drug therapy: Secondary | ICD-10-CM | POA: Insufficient documentation

## 2023-09-10 DIAGNOSIS — M533 Sacrococcygeal disorders, not elsewhere classified: Secondary | ICD-10-CM | POA: Diagnosis present

## 2023-09-10 DIAGNOSIS — F319 Bipolar disorder, unspecified: Secondary | ICD-10-CM | POA: Diagnosis present

## 2023-09-10 DIAGNOSIS — R252 Cramp and spasm: Secondary | ICD-10-CM | POA: Diagnosis present

## 2023-09-10 DIAGNOSIS — Z87891 Personal history of nicotine dependence: Secondary | ICD-10-CM | POA: Diagnosis present

## 2023-09-10 DIAGNOSIS — G8929 Other chronic pain: Secondary | ICD-10-CM | POA: Diagnosis present

## 2023-09-10 DIAGNOSIS — M79642 Pain in left hand: Secondary | ICD-10-CM | POA: Insufficient documentation

## 2023-09-10 DIAGNOSIS — M24521 Contracture, right elbow: Secondary | ICD-10-CM | POA: Insufficient documentation

## 2023-09-10 DIAGNOSIS — F4310 Post-traumatic stress disorder, unspecified: Secondary | ICD-10-CM | POA: Diagnosis present

## 2023-09-10 DIAGNOSIS — R918 Other nonspecific abnormal finding of lung field: Secondary | ICD-10-CM | POA: Diagnosis present

## 2023-09-10 DIAGNOSIS — M24522 Contracture, left elbow: Secondary | ICD-10-CM | POA: Insufficient documentation

## 2023-09-10 DIAGNOSIS — Z8659 Personal history of other mental and behavioral disorders: Secondary | ICD-10-CM | POA: Diagnosis present

## 2023-09-10 DIAGNOSIS — M79641 Pain in right hand: Secondary | ICD-10-CM | POA: Insufficient documentation

## 2023-09-10 DIAGNOSIS — M4807 Spinal stenosis, lumbosacral region: Secondary | ICD-10-CM | POA: Insufficient documentation

## 2023-09-10 DIAGNOSIS — E538 Deficiency of other specified B group vitamins: Secondary | ICD-10-CM | POA: Insufficient documentation

## 2023-09-10 DIAGNOSIS — M45 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: Secondary | ICD-10-CM | POA: Diagnosis not present

## 2023-09-10 DIAGNOSIS — Z96652 Presence of left artificial knee joint: Secondary | ICD-10-CM | POA: Insufficient documentation

## 2023-09-10 DIAGNOSIS — Z8709 Personal history of other diseases of the respiratory system: Secondary | ICD-10-CM | POA: Insufficient documentation

## 2023-09-11 ENCOUNTER — Telehealth: Payer: Self-pay | Admitting: Physical Medicine and Rehabilitation

## 2023-09-11 ENCOUNTER — Other Ambulatory Visit: Payer: Self-pay | Admitting: Physical Medicine and Rehabilitation

## 2023-09-11 ENCOUNTER — Telehealth: Payer: Self-pay | Admitting: Family Medicine

## 2023-09-11 LAB — COMPLETE METABOLIC PANEL WITH GFR
AG Ratio: 1.3 (calc) (ref 1.0–2.5)
ALT: 25 U/L (ref 6–29)
AST: 21 U/L (ref 10–35)
Albumin: 4.1 g/dL (ref 3.6–5.1)
Alkaline phosphatase (APISO): 158 U/L — ABNORMAL HIGH (ref 37–153)
BUN/Creatinine Ratio: 20 (calc) (ref 6–22)
BUN: 23 mg/dL (ref 7–25)
CO2: 24 mmol/L (ref 20–32)
Calcium: 10.1 mg/dL (ref 8.6–10.4)
Chloride: 106 mmol/L (ref 98–110)
Creat: 1.15 mg/dL — ABNORMAL HIGH (ref 0.60–1.00)
Globulin: 3.2 g/dL (ref 1.9–3.7)
Glucose, Bld: 111 mg/dL — ABNORMAL HIGH (ref 65–99)
Potassium: 5.6 mmol/L — ABNORMAL HIGH (ref 3.5–5.3)
Sodium: 138 mmol/L (ref 135–146)
Total Bilirubin: 0.4 mg/dL (ref 0.2–1.2)
Total Protein: 7.3 g/dL (ref 6.1–8.1)
eGFR: 51 mL/min/{1.73_m2} — ABNORMAL LOW (ref >=60)

## 2023-09-11 LAB — MAGNESIUM: Magnesium: 1.9 mg/dL (ref 1.5–2.5)

## 2023-09-11 LAB — TSH: TSH: 1.05 m[IU]/L (ref 0.40–4.50)

## 2023-09-11 MED ORDER — TRAMADOL HCL 50 MG PO TABS: 50.0000 mg | ORAL_TABLET | Freq: Three times a day (TID) | ORAL | 0 refills | Status: DC | PRN

## 2023-09-11 NOTE — Telephone Encounter (Signed)
Pt is calling and seen rheum and her potassium was elevated and per pt PCP need to repeat

## 2023-09-11 NOTE — Telephone Encounter (Signed)
Pt called agina requesting refill of tramadol be sent right away. Pt states she is going out of town tomorrow and really need her medication. Please send refill to [facility]. Please call pt at [phone].

## 2023-09-11 NOTE — Progress Notes (Signed)
TSH WNL  Magnesium WNL  Potassium is elevated-5.6.  please notify the patient--is she taking a potassium supplement? Please forward to PCP.  She will need to have potassium rechecked.    Creatinine is elevated-1.15 and GFR is low-51-improving. She remains on oral diclofenac for pain relief.  LFTs are WNL  Alk phos remains borderline elevated but has improved.

## 2023-09-11 NOTE — Progress Notes (Signed)
Ok to try OTC magnesium at a low dose since her Mg is on the lower end of normal and she is experiencing muscle cramping.

## 2023-09-12 NOTE — Telephone Encounter (Signed)
Please ask the rheumatologist send [place] the documentation -- labs and the progress note. Thanks!

## 2023-09-16 NOTE — Telephone Encounter (Signed)
Left a detailed message at the patient's voicemail to have the rheumatologist fax her office notes and request to our office at [phone].

## 2023-09-19 NOTE — Telephone Encounter (Signed)
Left a second detailed message at the patient's voicemail with the request as below.

## 2023-09-22 ENCOUNTER — Ambulatory Visit
Admission: RE | Admit: 2023-09-22 | Discharge: 2023-09-22 | Disposition: A | Discharge status: Home / Self Care | Payer: Medicare Other | Source: Ambulatory Visit | Attending: Acute Care | Admitting: Acute Care

## 2023-09-22 DIAGNOSIS — R918 Other nonspecific abnormal finding of lung field: Secondary | ICD-10-CM

## 2023-09-26 ENCOUNTER — Other Ambulatory Visit: Payer: Self-pay

## 2023-10-06 NOTE — Patient Outreach (Signed)
  Care Management   Visit Note   Name: Judy [name] MRN: [id number] DOB: 06-22-1951  Subjective: Judy [name] is a 72 y.o. year old female who is a primary care patient of Karie Georges, MD. The Care Management team was consulted for assistance.      Engaged with Judy [name] via telephone.  Assessment:  Review of patient past medical history, allergies, medications, health status, including review of consultants reports, laboratory and other test data, was performed as part of  evaluation and provision of care management services.    Outpatient Encounter Medications as of 09/26/2023  Medication Sig   amoxicillin-clavulanate (AUGMENTIN) 875-125 MG tablet Take 1 tablet by mouth 2 (two) times daily.   atorvastatin (LIPITOR) 80 MG tablet Take 1 tablet by mouth once daily   Biotin 65784 MCG TABS Take by mouth.   Calcium-Phosphorus-Vitamin D (CITRACAL +D3 PO) Take 1 tablet by mouth daily. Calcium 650 mg & vitamin D 1000 units   Cholecalciferol (VITAMIN D3) 50 MCG (2000 UT) TABS Take 1 tablet by mouth in the morning.   Cyanocobalamin (VITAMIN B-12 PO) Take 2,500 mcg by mouth in the morning.   diclofenac (VOLTAREN) 75 MG EC tablet Take 75 mg by mouth 2 (two) times daily.   FLUoxetine (PROZAC) 20 MG capsule Take 20 mg by mouth daily.   levalbuterol (XOPENEX HFA) 45 MCG/ACT inhaler Inhale 1 puff into the lungs every 6 (six) hours as needed for wheezing or shortness of breath.   LORazepam (ATIVAN) 1 MG tablet Take 1 mg by mouth as needed.   metFORMIN (GLUCOPHAGE) 500 MG tablet Take 1 tablet (500 mg total) by mouth 2 (two) times daily with a meal.   Misc Natural Products (NEURIVA) CAPS Take by mouth.   predniSONE (DELTASONE) 10 MG tablet 4 tabs for 2 days, then 3 tabs for 2 days, 2 tabs for 2 days, then 1 tab for 2 days, then stop   QUEtiapine (SEROQUEL) 400 MG tablet Take 800 mg by mouth at bedtime.   traMADol (ULTRAM) 50 MG tablet Take 1 tablet (50 mg total) by mouth every 8  (eight) hours as needed for moderate pain (pain score 4-6) or severe pain (pain score 7-10).   traZODone (DESYREL) 150 MG tablet Take 150 mg by mouth at bedtime.   TRELEGY ELLIPTA 100-62.5-25 MCG/ACT AEPB INHALE 1 PUFF ONCE DAILY   triamcinolone cream (KENALOG) 0.1 % Apply 1 Application topically 2 (two) times daily.   No facility-administered encounter medications on file as of 09/26/2023.

## 2023-10-07 ENCOUNTER — Telehealth: Payer: Self-pay | Admitting: Physical Medicine and Rehabilitation

## 2023-10-07 NOTE — Telephone Encounter (Signed)
Received call from Toniann Fail w/ [facility], requesting notes from radiofrequency ablation. I faxed 03/18/23 note to her at [phone], callback [phone]

## 2023-10-09 ENCOUNTER — Telehealth: Payer: Self-pay | Admitting: Acute Care

## 2023-10-09 DIAGNOSIS — R911 Solitary pulmonary nodule: Secondary | ICD-10-CM

## 2023-10-09 NOTE — Telephone Encounter (Signed)
 Patient had LDCT on 09/22/2023 that resulted as an LR3. After review, Lauraine Lites NP, agrees with a 6 month follow up scan - due 03/23/2024. Patient also has noted significant CAD and per pt's chart does appear to be on a statin. Patient also has noted ILD, will need see if pt is ok seeing an ILD specialist in the office. Can schedule consult with Dr. Theophilus or Dr. Geronimo for eval.    IMPRESSION: 1. Improving nodularity compared to the prior study, currently categorized as Lung-RADS 3S, probably benign findings. Short-term follow-up in 6 months is recommended with repeat low-dose chest CT without contrast (please use the following order, CT CHEST LCS NODULE FOLLOW-UP W/O CM). 2. The S modifier above refers to potentially clinically significant non lung cancer related findings. Specifically, there is aortic atherosclerosis, in addition to left main and 2 vessel coronary artery disease. Please note that although the presence of coronary artery calcium  documents the presence of coronary artery disease, the severity of this disease and any potential stenosis cannot be assessed on this non-gated CT examination. Assessment for potential risk factor modification, dietary therapy or pharmacologic therapy may be warranted, if clinically indicated. 3. Mild diffuse bronchial wall thickening with mild centrilobular and paraseptal emphysema; imaging findings suggestive of underlying COPD. 4. 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 Outpatient referral to Pulmonology for further clinical evaluation should be considered.   Aortic Atherosclerosis (ICD10-I70.0) and Emphysema (ICD10-J43.9).     Electronically Signed   By: Toribio Aye M.D.   On: 10/03/2023 11:32

## 2023-10-10 NOTE — Telephone Encounter (Signed)
 Called and spoke to pt. Informed her of the results of LDCT and recommendations of a 6 month follow up scan. Patient agreeable to scan, order placed. Patient IS on statin therapy and has regular visits with PCP regarding cholesterol. Patient sees Dr. Theophilus for COPD and has a routine appt with him on 12/09/2023, ILD can be evaluated during that OV. Pt verbalized understanding and denied any further questions or concerns at this time. Results and plan sent to PCP, Harlene Biles (pain clinician, requested by pt), and Dr. Theophilus.

## 2023-10-17 ENCOUNTER — Telehealth: Payer: Self-pay | Admitting: Physical Medicine and Rehabilitation

## 2023-10-17 ENCOUNTER — Other Ambulatory Visit: Payer: Self-pay | Admitting: Physical Medicine and Rehabilitation

## 2023-10-17 NOTE — Telephone Encounter (Signed)
 Patient called and needs some thing else other than Tramadol. [phone]

## 2023-10-22 ENCOUNTER — Other Ambulatory Visit: Payer: Self-pay

## 2023-10-23 ENCOUNTER — Telehealth: Payer: Self-pay | Admitting: Physical Medicine and Rehabilitation

## 2023-10-23 NOTE — Telephone Encounter (Signed)
Patient called needing Rx refilled Tramadol. The number to contact patient is [phone]

## 2023-10-27 ENCOUNTER — Other Ambulatory Visit: Payer: Self-pay | Admitting: Physical Medicine and Rehabilitation

## 2023-10-27 LAB — HM MAMMOGRAPHY

## 2023-10-27 NOTE — Patient Outreach (Addendum)
Care Management   Visit Note   Name: Judy [name] MRN: [id number] DOB: 08-12-1951  Subjective: Judy [name] is a 73 y.o. year old female who is a primary care patient of Judy Georges, MD. The Care Management team was consulted for assistance.      Engaged with Ms. Lanz via telephone.  Assessment:  Review of patient past medical history, allergies, medications, health status, including review of consultants reports, laboratory and other test data, was performed as part of  evaluation and provision of care management services.    Outpatient Encounter Medications as of 10/22/2023  Medication Sig   amoxicillin-clavulanate (AUGMENTIN) 875-125 MG tablet Take 1 tablet by mouth 2 (two) times daily.   atorvastatin (LIPITOR) 80 MG tablet Take 1 tablet by mouth once daily   Biotin 29518 MCG TABS Take by mouth.   Calcium-Phosphorus-Vitamin D (CITRACAL +D3 PO) Take 1 tablet by mouth daily. Calcium 650 mg & vitamin D 1000 units   Cholecalciferol (VITAMIN D3) 50 MCG (2000 UT) TABS Take 1 tablet by mouth in the morning.   Cyanocobalamin (VITAMIN B-12 PO) Take 2,500 mcg by mouth in the morning.   diclofenac (VOLTAREN) 75 MG EC tablet Take 75 mg by mouth 2 (two) times daily.   FLUoxetine (PROZAC) 20 MG capsule Take 20 mg by mouth daily.   levalbuterol (XOPENEX HFA) 45 MCG/ACT inhaler Inhale 1 puff into the lungs every 6 (six) hours as needed for wheezing or shortness of breath.   LORazepam (ATIVAN) 1 MG tablet Take 1 mg by mouth as needed.   metFORMIN (GLUCOPHAGE) 500 MG tablet Take 1 tablet (500 mg total) by mouth 2 (two) times daily with a meal.   Misc Natural Products (NEURIVA) CAPS Take by mouth.   predniSONE (DELTASONE) 10 MG tablet 4 tabs for 2 days, then 3 tabs for 2 days, 2 tabs for 2 days, then 1 tab for 2 days, then stop   QUEtiapine (SEROQUEL) 400 MG tablet Take 800 mg by mouth at bedtime.   traMADol (ULTRAM) 50 MG tablet Take 1 tablet (50 mg total) by mouth every 8  (eight) hours as needed for moderate pain (pain score 4-6) or severe pain (pain score 7-10).   traZODone (DESYREL) 150 MG tablet Take 150 mg by mouth at bedtime.   TRELEGY ELLIPTA 100-62.5-25 MCG/ACT AEPB INHALE 1 PUFF ONCE DAILY   triamcinolone cream (KENALOG) 0.1 % Apply 1 Application topically 2 (two) times daily.   No facility-administered encounter medications on file as of 10/22/2023.     Goals       Monitor, Self-Manage And Reduce Symptoms of COPD      Current Barriers:  Care Management support and education needs related to COPD  Planned Interventions: Reviewed provider's plan for COPD management.  Reviewed medications. Reports consistently using inhaler as prescribed.  Reviewed symptoms. Reports symptoms have been controlled with prescribed inhalers. Reports oxygen saturations have remained in the high 90s. She does not require oxygen therapy. Reviewed triggers. Advised to remove known allergens, triggers and irritants to prevent exacerbation.  Reports oxygen saturations have remained stable in the high 90s. Reviewed importance of daily self-assessment. Reminded of need to make an appointment if experiencing moderate symptoms for greater than 48 hours without improvement.  Discussed activity tolerance. Reports decreased activity but notes it is not related to decreased tolerance but d/t chronic pain in her legs and back. Reports pending evaluation with a pain provider. Will follow up with her primary care provider if additional referrals  or evaluations are needed r/t joints. Reviewed safety and fall prevention measures. Reviewed information regarding infection prevention and increased risk r/t COPD. Advised to utilize prevention strategies to reduce risk of respiratory infection.  Advised to continue attending follow ups with Pulmonology/Dr. Isaiah Serge as scheduled. Reviewed worsening symptoms that require immediate medical attention.    Symptom Management: Complete appointments with  the medical team as scheduled Take medications and use inhalers as prescribed Assess symptoms daily Notify provider if in the yellow zone for 48 hrs without improvement Follow recommendations to prevent respiratory infection Notify provider or care management team with questions and new concerns as needed           PLAN Will follow up within the next month    Judy [name] [facility] RN Care Manager Direct Dial: [phone]  Fax: [phone] Website: [email or web address]

## 2023-10-31 ENCOUNTER — Telehealth: Payer: Self-pay

## 2023-10-31 ENCOUNTER — Other Ambulatory Visit: Payer: Self-pay

## 2023-10-31 NOTE — Patient Outreach (Signed)
  Care Management   Outreach Note  10/31/2023 Name: Judy [name] MRN: [id number] DOB: Nov 25, 1950  An unsuccessful outreach attempt was made today for a scheduled Care Management visit.   Follow Up Plan:  A HIPAA compliant phone message was left for the patient providing contact information and requesting a return call.     Juanell Fairly [facility] RN Care Manager Direct Dial: [phone]  Fax: [phone] Website: [email or web address]

## 2023-11-17 ENCOUNTER — Other Ambulatory Visit: Payer: Self-pay | Admitting: Internal Medicine

## 2023-11-20 ENCOUNTER — Encounter: Payer: Self-pay | Admitting: Family Medicine

## 2023-11-20 ENCOUNTER — Ambulatory Visit (INDEPENDENT_AMBULATORY_CARE_PROVIDER_SITE_OTHER): Payer: Medicare Other | Admitting: Family Medicine

## 2023-11-20 ENCOUNTER — Other Ambulatory Visit: Payer: Self-pay

## 2023-11-20 VITALS — BP 138/76 | HR 70 | Temp 97.3°F | Ht 70.0 in | Wt 240.1 lb

## 2023-11-20 DIAGNOSIS — F319 Bipolar disorder, unspecified: Secondary | ICD-10-CM

## 2023-11-20 DIAGNOSIS — Z7984 Long term (current) use of oral hypoglycemic drugs: Secondary | ICD-10-CM

## 2023-11-20 DIAGNOSIS — E119 Type 2 diabetes mellitus without complications: Secondary | ICD-10-CM

## 2023-11-20 LAB — POCT GLYCOSYLATED HEMOGLOBIN (HGB A1C): Hemoglobin A1C: 6.1 % — AB (ref 4.0–5.6)

## 2023-11-20 MED ORDER — METFORMIN HCL 1000 MG PO TABS
1000.0000 mg | ORAL_TABLET | Freq: Two times a day (BID) | ORAL | 1 refills | Status: DC
Start: 2023-11-20 — End: 2023-12-25

## 2023-11-20 MED ORDER — TIRZEPATIDE 2.5 MG/0.5ML ~~LOC~~ SOAJ
2.5000 mg | SUBCUTANEOUS | 2 refills | Status: DC
Start: 2023-11-20 — End: 2024-01-07

## 2023-11-20 NOTE — Patient Outreach (Addendum)
  Care Management   Visit Note  11/20/2023 Name: [name] MRN: [id number] DOB: 1951/09/28  Subjective: Judy [name] is a 73 y.o. year old female who is a primary care patient of Karie Georges, MD. The Care Management team was consulted for assistance.      Brief outreach with Ms. Moris. Reports being on the road and being unable to complete care management outreach at time of call Anticipates being available to complete outreach later this month. Appointment scheduled for 12/05/23 at 1000. Agreed to call if she requires urgent needs or if appointment needs to be rescheduled.  Reminded of appointment with Primary Care Provider later today at 2:30.    PLAN Appointment scheduled for 12/05/23 at 1000.    Juanell Fairly [facility] RN Care Manager Direct Dial: [phone]  Fax: [phone] Website: [email or web address]

## 2023-11-20 NOTE — Progress Notes (Signed)
Established Patient Office Visit  Subjective   Patient ID: Judy [name], female    DOB: 1951-01-29  Age: 73 y.o. MRN: [id number]  Chief Complaint  Patient presents with   Medical Management of Chronic Issues    Pt is here for follow up today on her DM. This was a new diagnosis last month and she was started on metformin 500 mg BID, this was also meant to help with the weight gain she was experiencing from the medications she was taking. She reports no side effects to the medication, no nausea or diarrhea. Pt states she is trying to cook more of her food instead of getting TV dinners, has reduced pasta, stopped eating ice cream.   A1C was reviewed and is improved, she has also lost about 5-6 pounds since her last visit. We discussed adding GLP medication to help and she is agreeable.   Current Outpatient Medications  Medication Instructions   atorvastatin (LIPITOR) 80 mg, Oral, Daily   Biotin 40981 MCG TABS Take by mouth.   Calcium-Phosphorus-Vitamin D (CITRACAL +D3 PO) 1 tablet, Daily   Cholecalciferol (VITAMIN D3) 50 MCG (2000 UT) TABS 1 tablet, Every morning   Cyanocobalamin (VITAMIN B-12 PO) 2,500 mcg, Every morning   diclofenac (VOLTAREN) 75 mg, 2 times daily   FLUoxetine (PROZAC) 20 mg, Daily   levalbuterol (XOPENEX HFA) 45 MCG/ACT inhaler 1 puff, Inhalation, Every 6 hours PRN   LORazepam (ATIVAN) 1 mg, As needed   metFORMIN (GLUCOPHAGE) 1,000 mg, Oral, 2 times daily with meals   Misc Natural Products (NEURIVA) CAPS Take by mouth.   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     Objective:     BP 138/76   Pulse 70   Temp (!) 97.3 F (36.3 C) (Oral)   Ht 5\' 10"  (1.778 m)   Wt 240 lb 1.6 oz (108.9 kg)   SpO2 95%   BMI 34.45 kg/m    Physical Exam Vitals reviewed.  Constitutional:      Appearance: Normal appearance. She is obese.  Cardiovascular:     Rate and Rhythm: Normal rate and regular rhythm.     Pulses: Normal pulses.     Heart sounds: Normal heart sounds. No murmur heard. Pulmonary:     Effort: Pulmonary effort is normal.     Breath sounds: Normal breath sounds. No wheezing.  Abdominal:     General: Bowel sounds are normal.  Musculoskeletal:     Right lower leg: No edema.  Left lower leg: No edema.  Neurological:     Mental Status: She is alert and oriented to person, place, and time. Mental status is at baseline.  Psychiatric:        Mood and Affect: Mood normal.        Behavior: Behavior normal.      The ASCVD Risk score (Arnett DK, et al., 2019) failed to calculate for the following reasons:   The valid total cholesterol range is 130 to 320 mg/dL    Assessment & Plan:  Diabetes mellitus without complication (HCC) Assessment & Plan: A1C improved with medication, will add GLP medication to continue lowering blood sugars, she will need urine microalbumin and CMP for follow up today.   Orders: -     POCT glycosylated hemoglobin (Hb A1C) -      metFORMIN HCl; Take 1 tablet (1,000 mg total) by mouth 2 (two) times daily with a meal.  Dispense: 180 tablet; Refill: 1 -     Microalbumin / creatinine urine ratio; Future -     Comprehensive metabolic panel; Future -     Tirzepatide; Inject 2.5 mg into the skin once a week.  Dispense: 2 mL; Refill: 2  Bipolar 1 disorder (HCC) -     Ambulatory referral to Psychiatry     Return in about 6 months (around 05/19/2024).    Karie Georges, MD

## 2023-11-21 LAB — COMPREHENSIVE METABOLIC PANEL
ALT: 23 U/L (ref 0–35)
AST: 21 U/L (ref 0–37)
Albumin: 4 g/dL (ref 3.5–5.2)
Alkaline Phosphatase: 170 U/L — ABNORMAL HIGH (ref 39–117)
BUN: 14 mg/dL (ref 6–23)
CO2: 26 meq/L (ref 19–32)
Calcium: 9.4 mg/dL (ref 8.4–10.5)
Chloride: 105 meq/L (ref 96–112)
Creatinine, Ser: 1.22 mg/dL — ABNORMAL HIGH (ref 0.40–1.20)
GFR: 44.55 mL/min — ABNORMAL LOW (ref >60.00)
Glucose, Bld: 87 mg/dL (ref 70–99)
Potassium: 4.8 meq/L (ref 3.5–5.1)
Sodium: 139 meq/L (ref 135–145)
Total Bilirubin: 0.4 mg/dL (ref 0.2–1.2)
Total Protein: 7.1 g/dL (ref 6.0–8.3)

## 2023-11-21 LAB — MICROALBUMIN / CREATININE URINE RATIO
Creatinine,U: 57.1 mg/dL
Microalb Creat Ratio: 12.3 mg/g (ref 0.0–30.0)
Microalb, Ur: <0.7 mg/dL (ref 0.0–1.9)

## 2023-11-24 NOTE — Assessment & Plan Note (Signed)
A1C improved with medication, will add GLP medication to continue lowering blood sugars, she will need urine microalbumin and CMP for follow up today.

## 2023-12-01 ENCOUNTER — Other Ambulatory Visit: Payer: Self-pay | Admitting: Pulmonary Disease

## 2023-12-02 ENCOUNTER — Telehealth: Payer: Self-pay | Admitting: Family Medicine

## 2023-12-02 ENCOUNTER — Telehealth: Payer: Self-pay | Admitting: Physical Medicine and Rehabilitation

## 2023-12-02 DIAGNOSIS — F319 Bipolar disorder, unspecified: Secondary | ICD-10-CM

## 2023-12-02 DIAGNOSIS — F4310 Post-traumatic stress disorder, unspecified: Secondary | ICD-10-CM

## 2023-12-02 NOTE — Telephone Encounter (Signed)
 Ok to send referral to [facility] for bipolar 1 and PTSD

## 2023-12-02 NOTE — Telephone Encounter (Signed)
 Spoke with the patient, informed her the referral was entered as below and someone will contact her with appt info.

## 2023-12-02 NOTE — Telephone Encounter (Signed)
 Copied from CRM [id number]. Topic: Referral - Request for Referral >> Dec 02, 2023 12:02 PM Isabell A wrote: Did the patient discuss referral with their provider in the last year? Yes (If No - schedule appointment) (If Yes - send message)  Appointment offered? No  Type of order/referral and detailed reason for visit: Psychologist  Preference of office, provider, location: [facility],  no specific provider, [place] [phone]  If referral order, have you been seen by this specialty before? No (If Yes, this issue or another issue? When? Where?  Can we respond through MyChart? No, phone call preferred

## 2023-12-02 NOTE — Telephone Encounter (Signed)
 Patient called needing to schedule an appointment with Dr. Alvester Morin for an injection in her right knee. The number to contact patient is [phone]

## 2023-12-05 ENCOUNTER — Telehealth: Payer: Self-pay

## 2023-12-05 ENCOUNTER — Other Ambulatory Visit: Payer: Self-pay

## 2023-12-05 NOTE — Patient Outreach (Signed)
  Care Management   Outreach Note  12/05/2023 Name: Judy [name] MRN: [id number] DOB: 02-09-1951  An unsuccessful outreach attempt was made today for a scheduled Care Management visit.    Follow Up Plan:  A HIPAA compliant phone message was left for the patient providing contact information and requesting a return call.     Juanell Fairly [facility] Population [facility] RN Care Manager Direct Dial: [phone]  Fax: [phone] Website: [email or web address]

## 2023-12-06 DIAGNOSIS — G4730 Sleep apnea, unspecified: Secondary | ICD-10-CM

## 2023-12-06 HISTORY — DX: Sleep apnea, unspecified: G47.30

## 2023-12-08 ENCOUNTER — Telehealth: Payer: Self-pay

## 2023-12-08 NOTE — Telephone Encounter (Signed)
 Copied from CRM [id number]. Topic: Clinical - Medication Question >> Dec 08, 2023 11:43 AM Denese Killings wrote: Reason for CRM: Patient finished the last sample injection Greggory Keen) yesterday and wants to know if doctor will be call in a prescription to pharmacy.

## 2023-12-09 ENCOUNTER — Encounter: Payer: Self-pay | Admitting: Pulmonary Disease

## 2023-12-09 ENCOUNTER — Ambulatory Visit: Payer: Medicare Other | Admitting: Pulmonary Disease

## 2023-12-09 VITALS — BP 130/72 | HR 98 | Ht 70.0 in | Wt 235.8 lb

## 2023-12-09 DIAGNOSIS — R911 Solitary pulmonary nodule: Secondary | ICD-10-CM

## 2023-12-09 DIAGNOSIS — Z87891 Personal history of nicotine dependence: Secondary | ICD-10-CM

## 2023-12-09 DIAGNOSIS — J449 Chronic obstructive pulmonary disease, unspecified: Secondary | ICD-10-CM

## 2023-12-09 DIAGNOSIS — R0683 Snoring: Secondary | ICD-10-CM | POA: Diagnosis not present

## 2023-12-09 NOTE — Progress Notes (Signed)
 Judy [name]    [id number]    Nov 28, 1950  Primary Care Physician:Michael, Judy Bergamo, MD  Referring Physician: Karie Georges, MD [address] [place],  [place] [address]  Chief complaint: Follow up for COPD  HPI: 73 y.o. with anxiety, PTSD, bipolar, COPD, asthma  Complains of dyspnea on exertion for the past 1 year.  She has symptoms on exertion, mild symptoms at rest, daily cough with clear mucus, sinus drainage.  She has had multiple hospitalizations over the past year for COPD exacerbations.  Given inhalers including Advair and Symbicort but does not feel it is helping.  Previously evaluated by pulmonary at [facility] and is here for second opinion.  Hospitalized in July 2018 for COPD exacerbation Hospitalized in November 2018 for COPD exacerbation, CT during both admissions were is negative for PE but showed bilateral groundglass opacities.  She was treated with antibiotics, oral prednisone.  Virus panel was positive for adenovirus at that time.  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  She did have a COPD exacerbation from viral illness while in rehab and was treated with antibiotics and prednisone.  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 Smoking history: 50-pack-year smoker.  Quit smoking in early 2022 Travel history: Originally  from [place].  No significant recent travel Relevant family history: Daughter has asthma.  No other significant family history of lung disease.  Interim history: Discussed the use of AI scribe software for clinical note transcription with the patient, who gave verbal consent to proceed.  Judy [name] "Judy [name]" is a 73 year old female with COPD who presents for follow-up.   She is being followed for COPD and her breathing is generally  stable with occasional shortness of breath. She uses Trelegy, which she finds effective, and rarely uses her rescue inhaler. She is not on prednisone. Her last screening CT in December 2024 showed stable nodules and improved inflammatory opacities. Oxygen levels are usually around 96%.  Her daughter is concerned about possible sleep apnea, noting that she stops breathing during sleep and snores. She experiences daytime fatigue and insomnia, waking every hour to hour and a half throughout the night. This cycle of insomnia affects her daily activities, leading to fatigue and inactivity.  During a cruise in December, she managed her activities with a cane and experienced shortness of breath with exertion, requiring brief pauses to rest. Despite the challenges, she was surprised by her ability to manage the walking involved.   Outpatient Encounter Medications as of 12/09/2023  Medication Sig   atorvastatin (LIPITOR) 80 MG tablet Take 1 tablet by mouth once daily   Biotin 28413 MCG TABS Take by mouth.   Calcium-Phosphorus-Vitamin D (CITRACAL +D3 PO) Take 1 tablet by mouth daily. Calcium 650 mg & vitamin D 1000 units   Cholecalciferol (VITAMIN D3) 50 MCG (2000 UT) TABS Take 1 tablet by mouth in the morning.   Cyanocobalamin (VITAMIN B-12 PO) Take 2,500 mcg by mouth in the morning.   diclofenac (VOLTAREN) 75 MG EC tablet Take 75 mg by mouth 2 (two) times daily.   FLUoxetine (PROZAC) 20 MG capsule Take 20 mg by mouth daily.   Fluticasone-Umeclidin-Vilant (TRELEGY ELLIPTA) 100-62.5-25 MCG/ACT AEPB INHALE 1 PUFF ONCE DAILY   levalbuterol (XOPENEX HFA) 45 MCG/ACT inhaler Inhale  1 puff into the lungs every 6 (six) hours as needed for wheezing or shortness of breath.   LORazepam (ATIVAN) 1 MG tablet Take 1 mg by mouth as needed.   metFORMIN (GLUCOPHAGE) 1000 MG tablet Take 1 tablet (1,000 mg total) by mouth 2 (two) times daily with a meal.   Misc Natural Products (NEURIVA) CAPS Take by mouth.   QUEtiapine  (SEROQUEL) 400 MG tablet Take 800 mg by mouth at bedtime.   tirzepatide [facility]) 2.5 MG/0.5ML Pen Inject 2.5 mg into the skin once a week.   traZODone (DESYREL) 150 MG tablet Take 150 mg by mouth at bedtime.   triamcinolone cream (KENALOG) 0.1 % Apply 1 Application topically 2 (two) times daily.   No facility-administered encounter medications on file as of 12/09/2023.   Physical Exam: Blood pressure 130/72, pulse 98, height 5\' 10"  (1.778 m), weight 235 lb 12.8 oz (107 kg), SpO2 97%. Gen:      No acute distress HEENT:  EOMI, sclera anicteric Neck:     No masses; no thyromegaly Lungs:    Clear to auscultation bilaterally; normal respiratory effort CV:         Regular rate and rhythm; no murmurs Abd:      + bowel sounds; soft, non-tender; no palpable masses, no distension Ext:    No edema; adequate peripheral perfusion Skin:      Warm and dry; no rash Neuro: alert and oriented x 3 Psych: normal mood and affect   Data Reviewed: Imaging: CTA 04/15/2017-no pulmonary embolism, patchy upper lobe groundglass opacities CTA 08/23/2018-no PE, patchy bilateral groundglass opacities, predominantly in the bases. High-resolution CT 08/14/2018-no ILD, bronchial wall thickening with atherosclerosis, small pulmonary nodules Screening CT chest 11/01/2020-mild emphysema, stable pulmonary nodules. Chest x-ray 02/03/22 chronic appearing interstitial markings Screening CT chest 07/17/2022-mild emphysema, faint chronic patchy tree-in-bud opacities Screening CT chest 09/22/2023-emphysema, improving nodularity I have reviewed the images personally.  PFTs: Spirometry 05/18/18 FEV1 1.76, F/F 65.  Moderate obstruction.  08/10/18 FVC 3.31 [94%), FEV1 2.25 [83%), F/F 68, TLC 83%, DLCO 61% Mild obstruction with moderate diffusion defect.  FENO 08/05/2018-8  Labs: CBC 08/25/2017-WBC 9.6, eos 0% CBC 08/05/2018- WBC 9.1, eos 0.6%, absolute eosinophil count 55  IgE 08/10/2018-79 Alpha-1 antitrypsin 08/10/2018-192,  PI MM  Assessment:  Chronic Obstructive Pulmonary Disease (COPD) COPD is well-managed with Trelegy. She reports occasional dyspnea but infrequent use of her rescue inhaler. No recent prednisone use. December 2024 CT showed stable nodules and improved inflammatory opacities. She managed a cruise with a cane, indicating good functional status. - Continue Trelegy - Monitor symptoms and rescue inhaler use - Annual CT scan to monitor nodules and inflammatory opacities  Abnormal CT, nodules Prior CTs noted with patchy bilateral groundglass opacities.  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  Follow-up high res CT does not show interstitial lung disease. She does have some mild tree-in-bud opacities suggestive of chronic MAI infection but is not able to produce sputum.  Will continue to monitor this.  Suspected Obstructive Sleep Apnea (OSA) Reports symptoms of OSA, including snoring, observed apneas, and daytime fatigue. Daughter observed apneas during sleep. Reports insomnia and frequent awakenings. Discussed home sleep study for diagnosis. If positive, CPAP therapy is first-line treatment. Discussed alternative treatment with Inspire implantable device if she qualifies. - Order home sleep study - If positive, discuss CPAP therapy and potential for Inspire implantable device if she qualifies  Restless Leg Syndrome Recently stopped Ropinirole  with primary care provider. -No changes to plan at this time.  General Health Maintenance Oxygen saturation typically around 96%, which is satisfactory. - Monitor oxygen levels regularly  Follow-up - Schedule follow-up visit in six months.   Plan/Recommendations: - Continue trelegy - Follow Low-dose screening CT chest - Home sleep study  Chilton Greathouse MD [facility] 12/09/2023, 2:12 PM  CC: Judy Georges, MD

## 2023-12-09 NOTE — Telephone Encounter (Signed)
 Left a detailed message with the information below at the patient's cell number.

## 2023-12-09 NOTE — Patient Instructions (Signed)
 I am glad the breathing is stable Continue the Trelegy inhaler Will order home sleep study Follow-up in 6 months

## 2023-12-09 NOTE — Telephone Encounter (Signed)
 Script was went on February 13th, she needs to call the pharmacy and ask them to fill it for her

## 2023-12-15 ENCOUNTER — Telehealth: Payer: Self-pay

## 2023-12-15 NOTE — Telephone Encounter (Signed)
*  Pulm  Pharmacy Patient Advocate Encounter   Received notification from CoverMyMeds that prior authorization for Levalbuterol Tartrate 45MCG/ACT aerosol  is required/requested.   Insurance verification completed.   The patient is insured through [facility] .   Per test claim:  Ventolin 18gm HFA  is preferred by the insurance.  If suggested medication is appropriate, Please send in a new RX and discontinue this one. If not, please advise as to why it's not appropriate so that we may request a Prior Authorization. Please note, some preferred medications may still require a PA.  If the suggested medications have not been trialed and there are no contraindications to their use, the PA will not be submitted, as it will not be approved.

## 2023-12-16 NOTE — Telephone Encounter (Signed)
 Dr Isaiah Serge- is there a reason that we can not prescribe her ventolin hfa instead of xopenex?  Please advise, thanks

## 2023-12-16 NOTE — Telephone Encounter (Signed)
 I think it should be okay to order Ventolin instead of Xopenex.  Please inform patient.

## 2023-12-16 NOTE — Telephone Encounter (Signed)
 I called the pt and there was no answer- LMTCB.   I have pended the rx for ventolin until we speak with her and notify her of change and verify pharm

## 2023-12-16 NOTE — Addendum Note (Signed)
 Addended by: Christen Butter on: 12/16/2023 03:56 PM   Modules accepted: Orders

## 2023-12-17 NOTE — Telephone Encounter (Signed)
 ATC X1. Lmtcb

## 2023-12-18 MED ORDER — ALBUTEROL SULFATE HFA 108 (90 BASE) MCG/ACT IN AERS: 2.0000 | INHALATION_SPRAY | Freq: Four times a day (QID) | RESPIRATORY_TRACT | 2 refills | Status: DC | PRN

## 2023-12-18 NOTE — Telephone Encounter (Signed)
 I called and spoke with pt. Pt informed and order sent. NFN

## 2023-12-18 NOTE — Addendum Note (Signed)
 Addended by: Gay Filler T on: 12/18/2023 01:52 PM   Modules accepted: Orders

## 2023-12-25 ENCOUNTER — Ambulatory Visit (INDEPENDENT_AMBULATORY_CARE_PROVIDER_SITE_OTHER): Payer: Medicare Other | Admitting: Family Medicine

## 2023-12-25 ENCOUNTER — Encounter: Payer: Self-pay | Admitting: Family Medicine

## 2023-12-25 VITALS — BP 100/54 | HR 90 | Temp 98.2°F | Ht 70.0 in | Wt 231.5 lb

## 2023-12-25 DIAGNOSIS — M45 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: Secondary | ICD-10-CM | POA: Diagnosis not present

## 2023-12-25 MED ORDER — TIZANIDINE HCL 4 MG PO TABS
4.0000 mg | ORAL_TABLET | Freq: Three times a day (TID) | ORAL | 2 refills | Status: DC | PRN
Start: 2023-12-25 — End: 2024-04-23

## 2023-12-25 NOTE — Progress Notes (Signed)
 Established Patient Office Visit  Subjective   Patient ID: Judy [name], female    DOB: 1950/12/03  Age: 73 y.o. MRN: [id number]  Chief Complaint  Patient presents with   Medical Management of Chronic Issues    Pt is here for follow up today, she reports the the mounjaro is working well, states that it really does suppress her appetite. Bowels are still regular.  Pt states she does have an appointment upcoming with the new psychiatrist at [facility].   Pt states she continues to go to [facility], states that she has also gone to the knee clinic and has had 2 injections in her right knee so far, states that they told her it would be a few weeks until the pain starts to improve. States that right now the pain is the worst in the right knee, then it alternates with her back. States that she stopped taking the diclofenac, fluoxetine, she reports these medications were not helping at all with her pain level. Is only taking the seroquel, lorazepam and the mounjaro.     Current Outpatient Medications  Medication Instructions   atorvastatin (LIPITOR) 80 mg, Oral, Daily   diclofenac (VOLTAREN) 75 mg, 2 times daily   FLUoxetine (PROZAC) 20 mg, Daily   Fluticasone-Umeclidin-Vilant (TRELEGY ELLIPTA) 100-62.5-25 MCG/ACT AEPB INHALE 1 PUFF ONCE DAILY   LORazepam (ATIVAN) 1 mg, As needed   QUEtiapine (SEROQUEL) 800 mg, Daily at bedtime   tirzepatide (MOUNJARO) 2.5 mg, Subcutaneous, Weekly   tiZANidine (ZANAFLEX) 4 mg, Oral, Every 8 hours PRN    Patient Active Problem List   Diagnosis Date Noted   Hyperlipidemia 07/04/2023   Diabetes mellitus without complication (HCC) 08/09/2022   Restless legs syndrome 08/09/2022   Status post total left knee replacement 12/28/2021   Primary osteoarthritis of left knee 10/25/2021   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     Objective:     BP (!) 100/54   Pulse 90   Temp 98.2 F (36.8 C) (Oral)   Ht 5\' 10"  (1.778 m)   Wt 231 lb 8 oz (105 kg)   SpO2 96%   BMI 33.22 kg/m    Physical Exam Vitals reviewed.  Constitutional:      Appearance: Normal appearance. She is obese.  Cardiovascular:     Rate and Rhythm: Normal rate and regular rhythm.     Heart sounds: Normal heart sounds. No murmur heard. Pulmonary:     Effort: Pulmonary effort is normal.     Breath sounds: Normal breath sounds. No wheezing.  Neurological:     Mental Status: She is alert and oriented to person, place, and time. Mental status is at baseline.  Psychiatric:        Mood and Affect: Mood normal.        Behavior: Behavior normal.      No results found for any visits on 12/25/23.    The ASCVD Risk score (Arnett DK, et al., 2019) failed to calculate for the following reasons:   The valid total cholesterol  range is 130 to 320 mg/dL    Assessment & Plan:  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) -     tiZANidine HCl; Take 1 tablet (4 mg total) by mouth every 8 (eight) hours as needed for muscle spasms.  Dispense: 90 tablet; Refill: 2   Pt already has follow up with me in August  No follow-ups on file.    Karie Georges, MD

## 2023-12-31 ENCOUNTER — Encounter: Payer: Self-pay | Admitting: Orthopaedic Surgery

## 2023-12-31 ENCOUNTER — Ambulatory Visit (INDEPENDENT_AMBULATORY_CARE_PROVIDER_SITE_OTHER): Payer: Medicare Other | Admitting: Orthopaedic Surgery

## 2023-12-31 DIAGNOSIS — G8929 Other chronic pain: Secondary | ICD-10-CM | POA: Diagnosis not present

## 2023-12-31 DIAGNOSIS — M25561 Pain in right knee: Secondary | ICD-10-CM | POA: Diagnosis not present

## 2023-12-31 DIAGNOSIS — M1711 Unilateral primary osteoarthritis, right knee: Secondary | ICD-10-CM | POA: Diagnosis not present

## 2023-12-31 MED ORDER — HYDROCODONE-ACETAMINOPHEN 5-325 MG PO TABS
1.0000 | ORAL_TABLET | Freq: Two times a day (BID) | ORAL | 0 refills | Status: DC | PRN
Start: 2023-12-31 — End: 2024-03-08

## 2023-12-31 NOTE — Assessment & Plan Note (Signed)
 Patient continues with chronic pain despite going to the pain management physician. I advised starting muscle relaxers to help with the muscle stiffness. She will followup with me in August at her regular appointment

## 2023-12-31 NOTE — Progress Notes (Signed)
 The patient is a 73 year old well-known to [place].  We replaced her left knee in March 2023 so is now been 2 years.  That knee is done well.  She does get occasional swelling but she is dealing more with a severely arthritic right knee.  She actually goes to a local arthritis clinic and that is a noninvasive clinic that provides steroid injections and gel injections in the knee.  This not really helping her but she is not interested in knee replacement surgery.  She says she needs something for pain like hydrocodone now told her this is not something we put anybody on for arthritis but only for postoperative pain for a short amount of time.  Examination of her right knee shows a painful knee with a moderate effusion.  There is pain throughout the arc of motion of her knee.  Her left total knee has full range of motion and is ligamentously stable.  From my standpoint there is really nothing else that I can do for her right knee other than a knee replacement given the severity of the arthritis.  If she gets to the point where she wants to proceed with a right knee replacement she will let us know.  I will send in a one-time prescription for hydrocodone that she will use sparingly but we also will not refill.  She understands that as well.  I have recommended topical Voltaren gel.

## 2024-01-07 ENCOUNTER — Other Ambulatory Visit: Payer: Self-pay | Admitting: Family Medicine

## 2024-01-07 DIAGNOSIS — E119 Type 2 diabetes mellitus without complications: Secondary | ICD-10-CM

## 2024-01-07 MED ORDER — TIRZEPATIDE 5 MG/0.5ML ~~LOC~~ SOAJ
5.0000 mg | SUBCUTANEOUS | 5 refills | Status: DC
Start: 2024-01-07 — End: 2024-08-27

## 2024-01-14 ENCOUNTER — Ambulatory Visit: Admitting: Physician Assistant

## 2024-01-14 ENCOUNTER — Encounter: Payer: Self-pay | Admitting: Physician Assistant

## 2024-01-14 DIAGNOSIS — M1711 Unilateral primary osteoarthritis, right knee: Secondary | ICD-10-CM | POA: Diagnosis not present

## 2024-01-14 MED ORDER — MELOXICAM 7.5 MG PO TABS: 7.5000 mg | ORAL_TABLET | Freq: Every day | ORAL | 0 refills | Status: DC

## 2024-01-14 NOTE — Progress Notes (Unsigned)
 Office Visit Note   [name]           Date of Birth: March 15, 1951           MRN: [id number] Visit Date: 01/14/2024              Requested by: Karie Georges, MD [address] [place],  [place] [address] PCP: Karie Georges, MD  No chief complaint on file.     HPI: Patient is a 73 year old woman who is been seen here before by Dr. Alvester Morin and Dr. Magnus Ivan.  She has a history of advanced tricompartmental arthritis in her right knee as well as spondylosis in her back.  She has declined knee replacement by Dr. Magnus Ivan.  She is followed by weight pain management.  She comes in today to discuss taking pain medication.  She has had no new injury complains of back pain knee pain  Assessment & Plan: Visit Diagnoses: Right knee pain back pain  Plan: She is currently taking half to 1 hydrocodone 2 times a day.  This was a prescription that was provided to her by Dr. Magnus Ivan.  She is having a flare of her symptoms.  I told her she can take 1 hydrocodone every 6 hours as needed she understands she will go through the pain medication quicker.  She does need to go back to [facility] where she is followed for long-term management of the this back issue.  I also gave her prescription for Mobic 7.5 mg hoping this can bring her pain level down to a place where the hydrocodone could be more effective.  She knows not to take any ibuprofen or other nonsteroidal anti-inflammatories while she is on the Mobic  Follow-Up Instructions: Return if symptoms worsen or fail to improve.   Ortho Exam  Patient is alert, oriented, no adenopathy, well-dressed, normal affect, normal respiratory effort. Patient ambulates with a cane she has diffuse tenderness in her lower back and her knee no signs for infection  Imaging: No results found. No images are attached to the encounter.  Labs: Lab Results  Component Value Date   HGBA1C 6.1 (A) 11/20/2023   HGBA1C 6.6 (H)  06/27/2023   HGBA1C 6.2 (A) 12/23/2022   ESRSEDRATE 41 (H) 10/17/2022   ESRSEDRATE 60 (H) 04/26/2020   CRP 20.1 (H) 10/17/2022     Lab Results  Component Value Date   ALBUMIN 4.0 11/20/2023   ALBUMIN 3.9 06/27/2023   ALBUMIN 4.2 10/30/2020    Lab Results  Component Value Date   MG 1.9 09/10/2023   MG 2.3 08/25/2017   MG 2.2 08/24/2017   Lab Results  Component Value Date   VD25OH 23.25 (L) 01/15/2021   VD25OH 12.39 (L) 10/30/2020   VD25OH 9.18 (L) 10/06/2018    No results found for: "PREALBUMIN"    Latest Ref Rng & Units 01/08/2023    3:41 PM 10/17/2022    3:19 PM 07/11/2022    2:08 PM  CBC EXTENDED  WBC 3.8 - 10.8 Thousand/uL 5.1  7.7  5.0   RBC 3.80 - 5.10 Million/uL 4.29  4.44  4.32   Hemoglobin 11.7 - 15.5 g/dL 19.1  47.8  29.5   HCT 35.0 - 45.0 % 36.6  38.1  37.9   Platelets 140 - 400 Thousand/uL 229  266  301   NEUT# 1,500 - 7,800 cells/uL 3,443  5,398  3,185   Lymph# 850 - 3,900 cells/uL 902.7  1,679  1,360      There is no height or weight on file to calculate BMI.  Orders:  No orders of the defined types were placed in this encounter.  Meds ordered this encounter  Medications   meloxicam (MOBIC) 7.5 MG tablet    Sig: Take 1 tablet (7.5 mg total) by mouth daily.    Dispense:  30 tablet    Refill:  0     Procedures: No procedures performed  Clinical Data: No additional findings.  ROS:  All other systems negative, except as noted in the HPI. Review of Systems  Objective: Vital Signs: There were no vitals taken for this visit.  Specialty Comments:  MRI LUMBAR SPINE WITHOUT CONTRAST   TECHNIQUE: Multiplanar, multisequence MR imaging of the lumbar spine was performed. No intravenous contrast was administered.   COMPARISON:  01/06/2020   FINDINGS: Segmentation:  5 lumbar type vertebral bodies.   Alignment:  Mild dextrocurvature.  No significant listhesis.   Vertebrae: No acute fracture or suspicious osseous lesion. Redemonstrated  compression deformity of L1, which appears unchanged. Increased T2 signal in the T12-L1 disc, with some loss of cortical signal in the T12-L1 endplates (series 5, image 8-9).   Conus medullaris and cauda equina: Conus extends to the L2 level. Conus and cauda equina appear normal.   Paraspinal and other soft tissues: Atrophy of the right psoas muscle, which is new from the prior exam.   Disc levels:   T12-L1: Minimal disc bulge. Mild facet arthropathy. No spinal canal stenosis or neural foraminal narrowing.   L1-L2: Minimal disc bulge. No spinal canal stenosis or neural foraminal narrowing.   L2-L3: Minimal disc bulge. No spinal canal stenosis or neural foraminal narrowing.   L3-L4: Mild disc bulge, which has progressed from prior exam, with superimposed left paracentral disc extrusion with 12 mm of caudal migration, which is new from the prior exam. Severe facet arthropathy with suspected medially oriented facet synovial cyst, which measures 2 x 2 x 4 mm (series 13, image 26). Mild-to-moderate spinal canal stenosis. Narrowing of the lateral recesses. Mild left neural foraminal narrowing, which is new from the prior exam.   L4-L5: Disc height loss with small left paracentral disc protrusion, which narrows the left lateral recess. No spinal canal stenosis or neural foraminal narrowing.   L5-S1: Large left lateral osteophyte, which may abut the exiting left L5 nerve. No significant disc bulge. Moderate facet arthropathy. No spinal canal stenosis or neural foraminal narrowing.   IMPRESSION: 1. Increased T2 signal in the T12-L1 disc, with some loss of cortical signal in the T12-L1 endplates. While this could be degenerative in nature, early discitis/osteomyelitis could have a similar appearance. Recommend correlation with inflammatory markers. 2. L3-L4 mild-to-moderate spinal canal stenosis and mild left neural foraminal narrowing, which is new from the prior exam. Narrowing  of the lateral recesses at this level could affect the descending L4 nerve roots. 3. L4-L5 narrowing of the left lateral recess, which could affect the descending left L5 nerve. 4. L5-S1 large left lateral osteophyte, which may abut the exiting left L5 nerve. 5. Atrophy of the right psoas muscle, which is new from the prior exam.     Electronically Signed   By: Wiliam Ke M.D.   On: 10/29/2022 12:33  PMFS History: Patient Active Problem List   Diagnosis Date Noted   Unilateral primary osteoarthritis, right knee 12/31/2023   Hyperlipidemia 07/04/2023   Diabetes mellitus without complication (HCC) 08/09/2022   Restless legs syndrome 08/09/2022  Status post total left knee replacement 12/28/2021   Primary osteoarthritis of left knee 10/25/2021   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) 11/30/2020   Status post total replacement of right hip 06/04/2019   Status post total replacement of left hip 03/19/2019   PTSD (post-traumatic stress disorder)    COPD (chronic obstructive pulmonary disease) (HCC)    Bipolar 1 disorder (HCC)    Anxiety    B12 deficiency 12/09/2018   Hyperglycemia 04/14/2017   Past Medical History:  Diagnosis Date   Anxiety    Arthritis    Avascular necrosis (HCC)    Left hip   Avascular necrosis of bone of hip, 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   Bipolar 1 disorder (HCC)    COPD (chronic obstructive pulmonary disease) (HCC)    Depression    Dyspnea    Pneumonia    PTSD (post-traumatic stress disorder)     Family History  Problem Relation Age of Onset   CAD Mother 69   COPD Mother    Depression Mother    Heart attack Father    Colon polyps Sister    Cancer Maternal Aunt        growth on face- underwent chemo   Heart attack Maternal Grandmother    Early death Maternal Grandfather    Hearing loss Paternal Grandfather    Asthma Daughter    Bipolar disorder Son     Past Surgical  History:  Procedure Laterality Date   bone spurs foot Right    CATARACT EXTRACTION Bilateral    COLONOSCOPY     COLONOSCOPY  07/2022   HAND SURGERY Left    JOINT REPLACEMENT     TONSILLECTOMY     TONSILLECTOMY AND ADENOIDECTOMY     TOTAL HIP ARTHROPLASTY Left 03/19/2019   Procedure: LEFT TOTAL HIP ARTHROPLASTY ANTERIOR APPROACH;  Surgeon: [name], MD;  Location: [facility];  Service: Orthopedics;  Laterality: Left;   TOTAL HIP ARTHROPLASTY Right 06/04/2019   Procedure: RIGHT TOTAL HIP ARTHROPLASTY ANTERIOR APPROACH;  Surgeon: [name], MD;  Location: [facility];  Service: Orthopedics;  Laterality: Right;   TOTAL KNEE ARTHROPLASTY Left 12/28/2021   Procedure: Left TOTAL KNEE ARTHROPLASTY;  Surgeon: [name], MD;  Location: [facility];  Service: Orthopedics;  Laterality: Left;   Social History   Occupational History   Occupation: retired  Tobacco Use   Smoking status: Former    Current packs/day: 0.00    Average packs/day: 1.3 packs/day for 43.0 years (53.8 ttl pk-yrs)    Types: Cigarettes    Start date: 05/19/1977    Quit date: 05/19/2020    Years since quitting: 3.6    Passive exposure: Never   Smokeless tobacco: Never  Vaping Use   Vaping status: Never Used  Substance and Sexual Activity   Alcohol use: Yes    Comment: occ   Drug use: No   Sexual activity: Not on file

## 2024-02-04 ENCOUNTER — Encounter

## 2024-02-04 DIAGNOSIS — R0683 Snoring: Secondary | ICD-10-CM

## 2024-02-07 ENCOUNTER — Other Ambulatory Visit: Payer: Self-pay | Admitting: Physician Assistant

## 2024-02-16 DIAGNOSIS — R0683 Snoring: Secondary | ICD-10-CM | POA: Diagnosis not present

## 2024-02-18 ENCOUNTER — Ambulatory Visit: Payer: Self-pay | Admitting: Pulmonary Disease

## 2024-02-23 ENCOUNTER — Telehealth: Payer: Self-pay

## 2024-02-23 NOTE — Telephone Encounter (Signed)
 Yes I would have her take both unless she is having side effects from being on both medications

## 2024-02-23 NOTE — Telephone Encounter (Signed)
 Patient informed of the message below.

## 2024-02-23 NOTE — Telephone Encounter (Signed)
 Please see below. PCP to advise.  Copied from CRM [id number]. Topic: Clinical - Medication Question >> Feb 23, 2024 12:19 PM Trula Gable C wrote: Reason for CRM: Patient called in regarding her prescriptions, wanted to know since she started taking tirzepatide (MOUNJARO) 5 MG/0.5ML Pen do she need to keep taking the Metformin  because she was prescribed that as well, is asking for a callback on this

## 2024-02-25 NOTE — Progress Notes (Signed)
 Office Visit Note  [name]             Date of Birth: [birth year]           MRN: [id number]             PCP: Aida House, MD Referring: Aida House, MD Visit Date: 03/10/2024 Occupation: @GUAROCC @  Subjective:  Pain in multiple joints  History of Present Illness: Judy [name] is a 73 y.o. female with ankylosing spondylitis, osteoarthritis and degenerative disc disease.  She had been receiving right knee joint viscosupplement injections at the knee pain center.  She has noticed improvement in her right knee joint pain.  Her left knee joint had replacement in 2023 which continues to hurt.  She states she has been noticing increased discomfort and swelling in her left knee and left foot.  She believes her left arm is swollen.  She had taken Cimzia  in the past and did not notice any difference on Cimzia .  She continues to have stiffness in her neck and back.  She states she was seen at the pain clinic and had facet joint injection in her lumbar spine 3 weeks ago which was helpful.  She is followed by Dr. Waylan Haggard for COPD.  She is followed by cardiology for coronary artery disease.  She was recently placed on baby aspirin .    Activities of Daily Living:  Patient reports morning stiffness for a few minutes.   Patient Denies nocturnal pain.  Difficulty dressing/grooming: Denies Difficulty climbing stairs: Reports Difficulty getting out of chair: Denies Difficulty using hands for taps, buttons, cutlery, and/or writing: Denies  Review of Systems  Constitutional:  Positive for fatigue.  HENT:  Positive for mouth dryness. Negative for mouth sores.   Eyes:  Negative for dryness.  Respiratory:  Positive for shortness of breath.   Cardiovascular:  Positive for swelling in legs/feet. Negative for chest pain and palpitations.  Gastrointestinal:  Positive for constipation. Negative for blood in stool and diarrhea.  Endocrine: Negative for increased urination.   Genitourinary:  Negative for involuntary urination.  Musculoskeletal:  Positive for joint pain, gait problem, joint pain, joint swelling and morning stiffness. Negative for myalgias, muscle weakness, muscle tenderness and myalgias.  Skin:  Negative for color change, rash, hair loss and sensitivity to sunlight.  Allergic/Immunologic: Negative for susceptible to infections.  Neurological:  Negative for dizziness and headaches.  Hematological:  Negative for swollen glands.  Psychiatric/Behavioral:  Positive for depressed mood and sleep disturbance. The patient is nervous/anxious.     PMFS History:  Patient Active Problem List   Diagnosis Date Noted   Coronary artery disease involving native coronary artery of native heart without angina pectoris 03/08/2024   Aortic atherosclerosis (HCC) 03/08/2024   Unilateral primary osteoarthritis, right knee 12/31/2023   Hyperlipidemia 07/04/2023   Diabetes mellitus without complication (HCC) 08/09/2022   Restless legs syndrome 08/09/2022   Status post total left knee replacement 12/28/2021   Primary osteoarthritis of left knee 10/25/2021   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) 11/30/2020   Status post total replacement of right hip 06/04/2019   Status post total replacement of left hip 03/19/2019   PTSD (post-traumatic stress disorder)    COPD (chronic obstructive pulmonary disease) (HCC)    Bipolar 1 disorder (HCC)    Anxiety    B12 deficiency 12/09/2018   Hyperglycemia 04/14/2017    Past Medical History:  Diagnosis Date   Anxiety    Arthritis  Avascular necrosis (HCC)    Left hip   Avascular necrosis of bone of hip, 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   Bipolar 1 disorder (HCC)    COPD (chronic obstructive pulmonary disease) (HCC)    Depression    Dyspnea    Pneumonia    PTSD (post-traumatic stress disorder)     Family History  Problem Relation Age of Onset   CAD  Mother 2   COPD Mother    Depression Mother    Heart attack Father    Colon polyps Sister    Cancer Maternal Aunt        growth on face- underwent chemo   Heart attack Maternal Grandmother    Early death Maternal Grandfather    Hearing loss Paternal Grandfather    Asthma Daughter    Hypertension Daughter    Bipolar disorder Son    Past Surgical History:  Procedure Laterality Date   bone spurs foot Right    CATARACT EXTRACTION Bilateral    COLONOSCOPY     COLONOSCOPY  07/2022   HAND SURGERY Left    JOINT REPLACEMENT     TONSILLECTOMY     TONSILLECTOMY AND ADENOIDECTOMY     TOTAL HIP ARTHROPLASTY Left 03/19/2019   Procedure: LEFT TOTAL HIP ARTHROPLASTY ANTERIOR APPROACH;  Surgeon: Arnie Lao, MD;  Location: [facility];  Service: Orthopedics;  Laterality: Left;   TOTAL HIP ARTHROPLASTY Right 06/04/2019   Procedure: RIGHT TOTAL HIP ARTHROPLASTY ANTERIOR APPROACH;  Surgeon: Arnie Lao, MD;  Location: [facility];  Service: Orthopedics;  Laterality: Right;   TOTAL KNEE ARTHROPLASTY Left 12/28/2021   Procedure: Left TOTAL KNEE ARTHROPLASTY;  Surgeon: Arnie Lao, MD;  Location: [facility];  Service: Orthopedics;  Laterality: Left;   Social History   Social History Narrative   Not on file   Immunization History  Administered Date(s) Administered   Influenza, High Dose Seasonal PF 08/25/2018   Moderna Sars-Covid-2 Vaccination 11/08/2019, 12/05/2019, 10/09/2020   Pneumococcal Conjugate-13 09/23/2018   Pneumococcal Polysaccharide-23 08/27/2016   Tdap 10/07/2012   Yellow Fever 07/05/2008     Objective: Vital Signs: BP 123/78 (BP Location: Left Arm, Patient Position: Sitting, Cuff Size: Normal)   Pulse 82   Resp 17   Ht 5\' 10"  (1.778 m)   Wt 221 lb (100.2 kg)   BMI 31.71 kg/m    Physical Exam Vitals and nursing note reviewed.  Constitutional:      Appearance: She is well-developed.  HENT:     Head: Normocephalic and atraumatic.  Eyes:      Conjunctiva/sclera: Conjunctivae normal.  Cardiovascular:     Rate and Rhythm: Normal rate and regular rhythm.     Heart sounds: Normal heart sounds.  Pulmonary:     Effort: Pulmonary effort is normal.     Breath sounds: Normal breath sounds.  Abdominal:     General: Bowel sounds are normal.     Palpations: Abdomen is soft.  Musculoskeletal:     Cervical back: Normal range of motion.  Lymphadenopathy:     Cervical: No cervical adenopathy.  Skin:    General: Skin is warm and dry.     Capillary Refill: Capillary refill takes less than 2 seconds.  Neurological:     Mental Status: She is alert and oriented to person, place, and time.  Psychiatric:        Behavior: Behavior normal.      Musculoskeletal Exam: She had very  limited flexion extension, lateral rotation of the cervical spine.  Thoracic kyphosis was noted.  She had limited range of motion of thoracic and lumbar spine.  She had good range of motion of her shoulders with discomfort.  She had no discomfort range of motion of her elbows, wrist, MCPs or PIPs.  She had some PIP and DIP thickening.  Hip joints were replaced and had limited range of motion.  Left knee joint was replaced with good range of motion.  She had some warmth on palpation of her right knee joint.  There was no tenderness over her ankles or MTPs.  No pedal edema was noted.  CDAI Exam: CDAI Score: -- Patient Global: --; Provider Global: -- Swollen: --; Tender: -- Joint Exam 03/10/2024   No joint exam has been documented for this visit   There is currently no information documented on the homunculus. Go to the Rheumatology activity and complete the homunculus joint exam.  Investigation: No additional findings.  Imaging: No results found.  Recent Labs: Lab Results  Component Value Date   WBC 5.1 01/08/2023   HGB 12.1 01/08/2023   PLT 229 01/08/2023   NA 139 11/20/2023   K 4.8 11/20/2023   CL 105 11/20/2023   CO2 26 11/20/2023   GLUCOSE 87  11/20/2023   BUN 14 11/20/2023   CREATININE 1.22 (H) 11/20/2023   BILITOT 0.4 11/20/2023   ALKPHOS 170 (H) 11/20/2023   AST 21 11/20/2023   ALT 23 11/20/2023   PROT 7.1 11/20/2023   ALBUMIN 4.0 11/20/2023   CALCIUM  9.4 11/20/2023   GFRAA 54 (L) 04/11/2021   QFTBGOLDPLUS NEGATIVE 04/18/2022    Speciality Comments: Cimzia  completed loading on June 22, 2020  Procedures:  No procedures performed Allergies: Celexa [citalopram hydrobromide] and Gabapentin    Assessment / Plan:     Visit Diagnoses: 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) - C-spine, thoracic spine, and lumbar spine fused: She continues to have limited range of motion of the cervical, thoracic and lumbar spine with chronic stiffness.  She denies any radiculopathy.  She has not noticed any worsening of range of motion.  She was on Cimzia  in the past I did not notice any improvement on Cimzia  for over almost an year.  Last dose of Cimzia  was administered on 03/13/2023.No synovitis was noted on the examination today.  I advised patient to contact us  if she develops any increased symptoms.   High risk medication use - Last dose of Cimzia  was administered on 03/13/2023.  Previously reinitiated Cimzia  on 04/18/2022.  Muscle cramping-she has intermittent muscle cramps.  Contracture of joint of both elbows - Flexion contractures--unchanged.  Bilateral hand pain-she complains of discomfort in her hands.  PIP and DIP thickening with no synovitis was noted.  Degeneration of intervertebral disc of lumbar region without discogenic back pain or lower extremity pain-she continues to have lower back discomfort.  Spinal stenosis of lumbosacral region - Recent facet joint injection performed on 12/02/2022.  She gets cortisone injections at the pain clinic.  She states the last cortisone injection was about 3 weeks ago.  Chronic SI joint pain-she has intermittent discomfort in the SI joints.  Status post total replacement of  both hips - Hx of avascular necrosis.  She had limited range of motion without discomfort.  Primary osteoarthritis of  right knee-patient has been getting viscosupplement injections at knee care center.  She had warmth on palpation of her right knee joint.  She reports instability and weakness in  her lower extremities due to osteoarthritis.  I will refer her to physical therapy.  A handout on lower extremity muscle strength exercises was given.  S/P total knee arthroplasty, left - Dr. Blackman-12/28/21.  She continues to have discomfort in her left knee.  She had good mobility without any warmth swelling or effusion.  Unstable gait-I will refer her to physical therapy.  Coronary artery disease involving native coronary artery of native heart without angina pectoris-followed by cardiology.  She was recently placed on aspirin .  Pulmonary nodules  History of COPD-she continues to have shortness of breath.  She may benefit from pulmonary rehab.  She will discuss it with Dr. Waylan Haggard at the follow-up visit.  Former smoker  Bipolar 1 disorder (HCC)  PTSD (post-traumatic stress disorder)  History of anxiety  B12 deficiency  Orders: No orders of the defined types were placed in this encounter.  No orders of the defined types were placed in this encounter.    Follow-Up Instructions: Return in about 1 year (around 03/10/2025) for Ankylosing spondylitis.   Nicholas Bari, MD  Note - This record has been created using Animal nutritionist.  Chart creation errors have been sought, but may not always  have been located. Such creation errors do not reflect on  the standard of medical care.

## 2024-03-02 ENCOUNTER — Other Ambulatory Visit: Payer: Self-pay | Admitting: Pulmonary Disease

## 2024-03-04 NOTE — Addendum Note (Signed)
 Addended by: Katie Parks A on: 03/04/2024 04:11 PM   Modules accepted: Orders

## 2024-03-08 ENCOUNTER — Encounter: Payer: Self-pay | Admitting: Cardiology

## 2024-03-08 ENCOUNTER — Ambulatory Visit: Attending: Cardiology | Admitting: Cardiology

## 2024-03-08 VITALS — BP 137/76 | HR 80 | Ht 70.0 in | Wt 221.6 lb

## 2024-03-08 DIAGNOSIS — E782 Mixed hyperlipidemia: Secondary | ICD-10-CM | POA: Diagnosis present

## 2024-03-08 DIAGNOSIS — I2510 Atherosclerotic heart disease of native coronary artery without angina pectoris: Secondary | ICD-10-CM | POA: Insufficient documentation

## 2024-03-08 DIAGNOSIS — I700 Atherosclerosis of aorta: Secondary | ICD-10-CM | POA: Diagnosis present

## 2024-03-08 MED ORDER — ATORVASTATIN CALCIUM 80 MG PO TABS: 80.0000 mg | ORAL_TABLET | Freq: Every day | ORAL | 1 refills | Status: AC

## 2024-03-08 MED ORDER — ASPIRIN 81 MG PO TBEC: 81.0000 mg | DELAYED_RELEASE_TABLET | Freq: Every day | ORAL | 1 refills | Status: DC

## 2024-03-08 NOTE — Progress Notes (Signed)
 Cardiology Office Note:  .   Date:  03/08/2024  ID:  Judy [name], DOB [birth year], MRN [id number] PCP: Aida House, MD  [facility] Providers Cardiologist:  Fransico Ivy, MD PCP: Aida House, MD  Chief Complaint  Patient presents with   Hyperlipidemia   Follow-up   Coronary Artery Disease   Aortic atherosclerosis     Judy [name] is a 73 y.o. female with mixed hyperlipidemia, CAD, aortic atherosclerosis, bipolar disorder, former smoker  History of Present Illness  Patient was last seen by Dr. Jacquelynn Matter for exertional dyspnea in 02/2023.  Echocardiogram and stress test did not show any cardiac etiology for exertional dyspnea.  CT chest in 09/2023 showed coronary and aortic atherosclerosis.  LDL was down to 61 in 06/2023, but she has been off Lipitor for several months now.    Reviewed recent lipid panel, LDL down to 61 from 108 in 2022.  Patient has been dizzy after taking Seroquel .  Separately, she does me that her entire left side of the body has been swollen for last 3 days.  She denies any chest pain, shortness of breath, orthopnea, PND.   Vitals:   03/08/24 1257  BP: 137/76  Pulse: 80  SpO2: 96%      Review of Systems  Cardiovascular:  Negative for chest pain, dyspnea on exertion, leg swelling, palpitations and syncope.        Studies Reviewed: Aaron Aas        EKG 03/08/2024: Normal sinus rhythm Normal ECG When compared with ECG of 22-Aug-2017 20:21,  PAC, PVCs absent    Independently interpreted 11/2023: Cr 1.22, eGFR 44  06/2023: Chol 129, TG 119, HDL 44, LDL 61  01/2023: Hb 12.1  CT chest 09/2023: 1. Improving nodularity compared to the prior study, currently categorized as Lung-RADS 3S, probably benign findings. Short-term follow-up in 6 months is recommended with repeat low-dose chest CT without contrast (please use the following order, "CT CHEST LCS NODULE FOLLOW-UP W/O CM"). 2. The "S" modifier above refers to  potentially clinically significant non lung cancer related findings. Specifically, there is aortic atherosclerosis, in addition to left main and 2 vessel coronary artery disease. Please note that although the presence of coronary artery calcium  documents the presence of coronary artery disease, the severity of this disease and any potential stenosis cannot be assessed on this non-gated CT examination. Assessment for potential risk factor modification, dietary therapy or pharmacologic therapy may be warranted, if clinically indicated. 3. Mild diffuse bronchial wall thickening with mild centrilobular and paraseptal emphysema; imaging findings suggestive of underlying COPD. 4. 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 Outpatient referral to Pulmonology for further clinical evaluation should be considered.   Aortic Atherosclerosis (ICD10-I70.0) and Emphysema (ICD10-J43.9).    Echocardiogram 02/2023:  1. Left ventricular ejection fraction, by estimation, is 55 to 60%. The  left ventricle has normal function. The left ventricle has no regional  wall motion abnormalities. There is mild left ventricular hypertrophy.  Left ventricular diastolic parameters  are indeterminate.   2. Right ventricular systolic function is normal. The right ventricular  size is normal.   3. The mitral valve is normal in structure. Mild mitral valve  regurgitation. No evidence of mitral stenosis.   4. The aortic valve is normal in structure. Aortic valve regurgitation is  not visualized. No aortic stenosis is present.   5. The inferior vena cava  is normal in size with greater than 50%  respiratory variability, suggesting right atrial pressure of 3 mmHg.   Stress test 02/2023:   LV perfusion is normal. There is no evidence of ischemia. There is no evidence of infarction.    Left ventricular function is normal. Nuclear stress EF: 67 %. The left ventricular ejection fraction is hyperdynamic (>65%). End diastolic cavity size is normal.   The study is normal. The study is low risk.     Physical Exam Vitals and nursing note reviewed.  Constitutional:      General: She is not in acute distress. Neck:     Vascular: No JVD.  Cardiovascular:     Rate and Rhythm: Normal rate and regular rhythm.     Heart sounds: Normal heart sounds. No murmur heard. Pulmonary:     Effort: Pulmonary effort is normal.     Breath sounds: Normal breath sounds. No wheezing or rales.      VISIT DIAGNOSES:   ICD-10-CM   1. Coronary artery disease involving native coronary artery of native heart without angina pectoris  I25.10 EKG 12-Lead    2. Aortic atherosclerosis (HCC)  I70.0        Judy [name] is a 73 y.o. female with mixed hyperlipidemia, CAD, aortic atherosclerosis, bipolar disorder, former smoker Assessment & Plan  CAD, aortic atherosclerosis: Reassuring echocardiogram and stress test in 02/2023. Continue aggressive risk factor modification. Given presence of calcification in left main artery, recommend aspirin  81 mg daily.  Avoid regular use of NSAIDs. LDL was down to 61 in 06/2023 on Lipitor 80 mg daily.  Recently, she has not been taking Lipitor.  Resume the same.  Symptoms of dizziness could be related to Seroquel , follow-up with psychiatry. I do not appreciate left-sided swelling and her entire body.  Stable from cardiac standpoint.  I will see her as needed. Continue follow-up with PCP and psychiatry.   Meds ordered this encounter  Medications   aspirin  EC 81 MG tablet    Sig: Take 1 tablet (81 mg total) by mouth daily. Swallow whole.    Dispense:  90 tablet    Refill:  1   atorvastatin  (LIPITOR) 80 MG tablet    Sig: Take 1 tablet (80 mg total) by mouth daily.    Dispense:  90 tablet    Refill:  1     F/u as needed  Signed, Cody Das, MD

## 2024-03-08 NOTE — Patient Instructions (Signed)
 Medication Instructions:  RESUME atorvastatin  80 mg daily   START Aspirin  81 mg   *If you need a refill on your cardiac medications before your next appointment, please call your pharmacy*  Follow-Up: At [facility], you and your health needs are our priority.  As part of our continuing mission to provide you with exceptional heart care, our providers are all part of one team.  This team includes your primary Cardiologist (physician) and Advanced Practice Providers or APPs (Physician Assistants and Nurse Practitioners) who all work together to provide you with the care you need, when you need it.  Your next appointment:   As needed   Provider:   Cody Das, MD    We recommend signing up for the patient portal called "MyChart".  Sign up information is provided on this After Visit Summary.  MyChart is used to connect with patients for Virtual Visits (Telemedicine).  Patients are able to view lab/test results, encounter notes, upcoming appointments, etc.  Non-urgent messages can be sent to your provider as well.   To learn more about what you can do with MyChart, go to [email or web address].

## 2024-03-10 ENCOUNTER — Ambulatory Visit: Payer: Medicare Other | Attending: Rheumatology | Admitting: Rheumatology

## 2024-03-10 ENCOUNTER — Encounter: Payer: Self-pay | Admitting: Rheumatology

## 2024-03-10 VITALS — BP 123/78 | HR 82 | Resp 17 | Ht 70.0 in | Wt 221.0 lb

## 2024-03-10 DIAGNOSIS — M45 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: Secondary | ICD-10-CM | POA: Insufficient documentation

## 2024-03-10 DIAGNOSIS — R2681 Unsteadiness on feet: Secondary | ICD-10-CM | POA: Diagnosis present

## 2024-03-10 DIAGNOSIS — M79641 Pain in right hand: Secondary | ICD-10-CM | POA: Insufficient documentation

## 2024-03-10 DIAGNOSIS — Z96643 Presence of artificial hip joint, bilateral: Secondary | ICD-10-CM | POA: Insufficient documentation

## 2024-03-10 DIAGNOSIS — Z87891 Personal history of nicotine dependence: Secondary | ICD-10-CM | POA: Insufficient documentation

## 2024-03-10 DIAGNOSIS — M4807 Spinal stenosis, lumbosacral region: Secondary | ICD-10-CM | POA: Insufficient documentation

## 2024-03-10 DIAGNOSIS — Z96652 Presence of left artificial knee joint: Secondary | ICD-10-CM | POA: Insufficient documentation

## 2024-03-10 DIAGNOSIS — I2510 Atherosclerotic heart disease of native coronary artery without angina pectoris: Secondary | ICD-10-CM | POA: Diagnosis present

## 2024-03-10 DIAGNOSIS — M24521 Contracture, right elbow: Secondary | ICD-10-CM | POA: Diagnosis present

## 2024-03-10 DIAGNOSIS — Z8659 Personal history of other mental and behavioral disorders: Secondary | ICD-10-CM | POA: Diagnosis present

## 2024-03-10 DIAGNOSIS — M1990 Unspecified osteoarthritis, unspecified site: Secondary | ICD-10-CM

## 2024-03-10 DIAGNOSIS — M79642 Pain in left hand: Secondary | ICD-10-CM | POA: Diagnosis present

## 2024-03-10 DIAGNOSIS — F319 Bipolar disorder, unspecified: Secondary | ICD-10-CM | POA: Diagnosis present

## 2024-03-10 DIAGNOSIS — M51369 Other intervertebral disc degeneration, lumbar region without mention of lumbar back pain or lower extremity pain: Secondary | ICD-10-CM | POA: Diagnosis present

## 2024-03-10 DIAGNOSIS — E538 Deficiency of other specified B group vitamins: Secondary | ICD-10-CM | POA: Diagnosis present

## 2024-03-10 DIAGNOSIS — M24522 Contracture, left elbow: Secondary | ICD-10-CM | POA: Diagnosis present

## 2024-03-10 DIAGNOSIS — M533 Sacrococcygeal disorders, not elsewhere classified: Secondary | ICD-10-CM | POA: Diagnosis present

## 2024-03-10 DIAGNOSIS — R252 Cramp and spasm: Secondary | ICD-10-CM | POA: Diagnosis present

## 2024-03-10 DIAGNOSIS — Z8709 Personal history of other diseases of the respiratory system: Secondary | ICD-10-CM | POA: Insufficient documentation

## 2024-03-10 DIAGNOSIS — Z79899 Other long term (current) drug therapy: Secondary | ICD-10-CM | POA: Insufficient documentation

## 2024-03-10 DIAGNOSIS — M1711 Unilateral primary osteoarthritis, right knee: Secondary | ICD-10-CM | POA: Insufficient documentation

## 2024-03-10 DIAGNOSIS — R918 Other nonspecific abnormal finding of lung field: Secondary | ICD-10-CM | POA: Insufficient documentation

## 2024-03-10 DIAGNOSIS — F4310 Post-traumatic stress disorder, unspecified: Secondary | ICD-10-CM | POA: Insufficient documentation

## 2024-03-10 DIAGNOSIS — G8929 Other chronic pain: Secondary | ICD-10-CM | POA: Insufficient documentation

## 2024-03-10 HISTORY — DX: Unspecified osteoarthritis, unspecified site: M19.90

## 2024-03-10 NOTE — Patient Instructions (Signed)
 Exercises for Chronic Knee Pain  Chronic knee pain is pain that lasts longer than 3 months. For most people with chronic knee pain, exercise and weight loss is an important part of treatment. Your health care provider may want you to focus on:  Making the muscles that support your knee stronger. This can take pressure off your knee and reduce pain.  Preventing knee stiffness.  How far you can move your knee, keeping it there or making it farther.  Losing weight (if this applies) to take pressure off your knee, lower your risk for injury, and make it easier for you to exercise.  Your provider will help you make an exercise program that fits your needs and physical abilities. Below are simple, low-impact exercises you can do at home. Ask your provider or physical therapist how often you should do your exercise program and how many times to repeat each exercise.  General safety tips    Get your provider's approval before doing any exercises.  Start slowly and stop any time you feel pain.  Do not exercise if your knee pain is flaring up.  Warm up first. Stretching a cold muscle can cause an injury. Do 5-10 minutes of easy movement or light stretching before beginning your exercises.  Do 5-10 minutes of low-impact activity (like walking or cycling) before starting strengthening exercises.  Contact your provider any time you have pain during or after exercising. Exercise can cause discomfort but should not be painful. It is normal to be a little stiff or sore after exercising.  Stretching and range-of-motion exercises  Front thigh stretch    Stand up straight and support your body by holding on to a chair or resting one hand on a Mastandrea.  With your legs straight and close together, bend one knee to lift your heel up toward your butt.  Using one hand for support, grab your ankle with your free hand.  Pull your foot up closer toward your butt to feel the stretch in front of your thigh.  Hold the stretch for 30  seconds.  Repeat __________ times. Complete this exercise __________ times a day.  Back thigh stretch    Sit on the floor with your back straight and your legs out straight in front of you.  Place the palms of your hands on the floor and slide them toward your feet as you bend at the hip.  Try to touch your nose to your knees and feel the stretch in the back of your thighs.  Hold for 30 seconds.  Repeat __________ times. Complete this exercise __________ times a day.  Calf stretch    Stand facing a Sheehy.  Place the palms of your hands flat against the Ghazi, arms extended, and lean slightly against the Muha.  Get into a lunge position with one leg bent at the knee and the other leg stretched out straight behind you.  Keep both feet facing the Ziesmer and increase the bend in your knee while keeping the heel of the other leg flat on the ground.  You should feel the stretch in your calf. Hold for 30 seconds.  Repeat __________ times. Complete this exercise __________ times a day.  Strengthening exercises  Straight leg lift    Lie on your back with one knee bent and the other leg out straight.  Slowly lift the straight leg without bending the knee.  Lift until your foot is about 12 inches (30 cm) off the floor.  Hold for  3-5 seconds and slowly lower your leg.  Repeat __________ times. Complete this exercise __________ times a day.  Single leg dip    Stand between two chairs and put both hands on the backs of the chairs for support.  Extend one leg out straight with your body weight resting on the heel of the standing leg.  Slowly bend your standing knee to dip your body to the level that is comfortable for you.  Hold for 3-5 seconds.  Repeat __________ times. Complete this exercise __________ times a day.  Hamstring curls    Stand straight, knees close together, facing the back of a chair.  Hold on to the back of a chair with both hands.  Keep one leg straight. Bend the other knee while bringing the heel up toward the butt  until the knee is bent at a 90-degree angle (right angle).  Hold for 3-5 seconds.  Repeat __________ times. Complete this exercise __________ times a day.  Neece squat    Stand straight with your back, hips, and head against a Rushing.  Step forward one foot at a time with your back still against the Spargur.  Your feet should be 2 feet (61 cm) from the Jeanpaul at shoulder width. Keeping your back, hips, and head against the Colligan, slide down the Brenner to as close to a sitting position as you can get.  Hold for 5-10 seconds, then slowly slide back up.  Repeat __________ times. Complete this exercise __________ times a day.  Step-ups    Stand in front of a sturdy platform or stool that is about 6 inches (15 cm) high.  Slowly step up with your left / right foot, keeping your knee in line with your hip and foot. Do not let your knee bend so far that you cannot see your toes. Hold on to a chair for balance, but do not use it for support.  Slowly unlock your knee and lower yourself to the starting position.  Repeat __________ times. Complete this exercise __________ times a day.  Contact a health care provider if:  Your exercises cause pain.  Your pain is worse after you exercise.  Your pain prevents you from doing your exercises.  This information is not intended to replace advice given to you by your health care provider. Make sure you discuss any questions you have with your health care provider.  Document Revised: 10/08/2022 Document Reviewed: 10/08/2022  Elsevier Patient Education  2024 [employer].

## 2024-03-26 ENCOUNTER — Telehealth (HOSPITAL_BASED_OUTPATIENT_CLINIC_OR_DEPARTMENT_OTHER): Payer: Self-pay

## 2024-03-26 NOTE — Telephone Encounter (Signed)
 Copied from CRM [id number]. Topic: Clinical - Request for Lab/Test Order >> Mar 26, 2024  9:49 AM Margarette Shawl wrote: Reason for CRM:   Judy [name], with [facility], is contacting clinic regarding an order for CT scan for LCS. She reports the order is not complete and is missing information.  Requested order be completed and resent to facility  CB#  [phone], opt 1 and then opt 4

## 2024-03-29 ENCOUNTER — Ambulatory Visit
Admission: RE | Admit: 2024-03-29 | Discharge: 2024-03-29 | Disposition: A | Discharge status: Home / Self Care | Source: Ambulatory Visit | Attending: Acute Care | Admitting: Acute Care

## 2024-03-29 DIAGNOSIS — R911 Solitary pulmonary nodule: Secondary | ICD-10-CM

## 2024-03-29 NOTE — Telephone Encounter (Signed)
 Called [facility] and spoke with tech. They said the order is not linked to the appt. I linked the order. Nothing further needed at the time of the call.

## 2024-03-31 ENCOUNTER — Ambulatory Visit: Payer: Medicare Other

## 2024-03-31 VITALS — BP 120/60 | HR 77 | Temp 98.2°F | Ht 70.0 in | Wt 215.7 lb

## 2024-03-31 DIAGNOSIS — Z0000 Encounter for general adult medical examination without abnormal findings: Secondary | ICD-10-CM

## 2024-03-31 NOTE — Patient Instructions (Addendum)
 Judy [name] , Thank you for taking time out of your busy schedule to complete your Annual Wellness Visit with me. I enjoyed our conversation and look forward to speaking with you again next year. I, as well as your care team,  appreciate your ongoing commitment to your health goals. Please review the following plan we discussed and let me know if I can assist you in the future. Your Game plan/ To Do List    Referrals: If you haven't heard from the office you've been referred to, please reach out to them at the phone provided.   Follow up Visits: Next Medicare AWV with our clinical staff: 04/06/25 @ 1:40p   Have you seen your provider in the last 6 months (3 months if uncontrolled diabetes)?  Next Office Visit with your provider: 06/03/24 @ 1p  Clinician Recommendations:  Aim for 30 minutes of exercise or brisk walking, 6-8 glasses of water , and 5 servings of fruits and vegetables each day.       This is a list of the screening recommended for you and due dates:  Health Maintenance  Topic Date Due   Eye exam for diabetics  Never done   Zoster (Shingles) Vaccine (1 of 2) Never done   DTaP/Tdap/Td vaccine (2 - Td or Tdap) 10/07/2022   COVID-19 Vaccine (4 - 2024-25 season) 06/08/2023   Pneumococcal Vaccine for age over 51 (3 of 3 - PCV20 or PCV21) 09/24/2023   Flu Shot  05/07/2024   Hemoglobin A1C  05/19/2024   Screening for Lung Cancer  09/21/2024   Yearly kidney function blood test for diabetes  11/19/2024   Yearly kidney health urinalysis for diabetes  11/19/2024   Complete foot exam   11/19/2024   Medicare Annual Wellness Visit  03/31/2025   Mammogram  10/26/2025   Colon Cancer Screening  08/01/2027   DEXA scan (bone density measurement)  Completed   Hepatitis C Screening  Completed   Hepatitis B Vaccine  Aged Out   HPV Vaccine  Aged Out   Meningitis B Vaccine  Aged Out    Advanced directives: (Copy Requested) Please bring a copy of your health care power of attorney and living  will to the office to be added to your chart at your convenience. You can mail to [facility] [address] [place], [place] [address] or email to [email or web address] Advance Care Planning is important because it:  [x]  Makes sure you receive the medical care that is consistent with your values, goals, and preferences  [x]  It provides guidance to your family and loved ones and reduces their decisional burden about whether or not they are making the right decisions based on your wishes.  Follow the link provided in your after visit summary or read over the paperwork we have mailed to you to help you started getting your Advance Directives in place. If you need assistance in completing these, please reach out to us  so that we can help you!  See attachments for Preventive Care and Fall Prevention Tips.

## 2024-03-31 NOTE — Progress Notes (Signed)
 Subjective:   LAMARA BRECHT is a 73 y.o. who presents for a Medicare Wellness preventive visit.  As a reminder, Annual Wellness Visits don't include a physical exam, and some assessments may be limited, especially if this visit is performed virtually. We may recommend an in-person follow-up visit with your provider if needed.  Visit Complete: In person    Persons Participating in Visit: Patient.  AWV Questionnaire: No: Patient Medicare AWV questionnaire was not completed prior to this visit.  Cardiac Risk Factors include: advanced age (>6men, >70 women)     Objective:    Today's Vitals   03/31/24 1337  BP: 120/60  Pulse: 77  Temp: 98.2 F (36.8 C)  TempSrc: Oral  SpO2: 97%  Weight: 215 lb 11.2 oz (97.8 kg)  Height: 5' 10 (1.778 m)   Body mass index is 30.95 kg/m.     03/31/2024    2:06 PM 04/30/2023    1:04 PM 03/28/2023    4:07 PM 03/18/2022    1:55 PM 02/05/2022    3:05 PM 12/28/2021    6:00 PM 12/28/2021   11:55 AM  Advanced Directives  Does Patient Have a Medical Advance Directive? Yes Yes Yes No No No No  Type of Estate agent of [place];Living will Healthcare Power of [place];Living will Healthcare Power of [place];Living will      Copy of Healthcare Power of Attorney in Chart? No - copy requested  No - copy requested      Would patient like information on creating a medical advance directive?    No - Patient declined No - Patient declined No - Patient declined No - Patient declined    Current Medications (verified) Outpatient Encounter Medications as of 03/31/2024  Medication Sig   QUEtiapine  (SEROQUEL ) 400 MG tablet Take 600 mg by mouth at bedtime. (Patient taking differently: Take 600 mg by mouth at bedtime.)   ALBUTEROL  IN Inhale into the lungs as needed.   aspirin  EC 81 MG tablet Take 1 tablet (81 mg total) by mouth daily. Swallow whole.   atorvastatin  (LIPITOR) 80 MG tablet Take 1 tablet (80 mg total) by mouth daily.    FLUoxetine (PROZAC) 20 MG capsule Take 20 mg by mouth daily. (Patient not taking: Reported on 03/10/2024)   Fluticasone -Umeclidin-Vilant (TRELEGY ELLIPTA ) 100-62.5-25 MCG/ACT AEPB INHALE 1 PUFF ONCE DAILY   tirzepatide  (MOUNJARO ) 5 MG/0.5ML Pen Inject 5 mg into the skin once a week.   tiZANidine  (ZANAFLEX ) 4 MG tablet Take 1 tablet (4 mg total) by mouth every 8 (eight) hours as needed for muscle spasms.   No facility-administered encounter medications on file as of 03/31/2024.    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   Bipolar 1 disorder (HCC)    COPD (chronic obstructive pulmonary disease) (HCC)    Depression    Dyspnea    Pneumonia    PTSD (post-traumatic stress disorder)    Past Surgical History:  Procedure Laterality Date   bone spurs foot Right    CATARACT EXTRACTION Bilateral    COLONOSCOPY     COLONOSCOPY  07/2022   HAND SURGERY Left    JOINT REPLACEMENT     TONSILLECTOMY     TONSILLECTOMY  AND ADENOIDECTOMY     TOTAL HIP ARTHROPLASTY Left 03/19/2019   Procedure: LEFT TOTAL HIP ARTHROPLASTY ANTERIOR APPROACH;  Surgeon: Vernetta Lonni GRADE, MD;  Location: [facility];  Service: Orthopedics;  Laterality: Left;   TOTAL HIP ARTHROPLASTY Right 06/04/2019   Procedure: RIGHT TOTAL HIP ARTHROPLASTY ANTERIOR APPROACH;  Surgeon: Vernetta Lonni GRADE, MD;  Location: [facility];  Service: Orthopedics;  Laterality: Right;   TOTAL KNEE ARTHROPLASTY Left 12/28/2021   Procedure: Left TOTAL KNEE ARTHROPLASTY;  Surgeon: Vernetta Lonni GRADE, MD;  Location: [facility];  Service: Orthopedics;  Laterality: Left;   Family History  Problem Relation Age of Onset   CAD Mother 19   COPD Mother    Depression Mother    Heart attack Father     Colon polyps Sister    Cancer Maternal Aunt        growth on face- underwent chemo   Heart attack Maternal Grandmother    Early death Maternal Grandfather    Hearing loss Paternal Grandfather    Asthma Daughter    Hypertension Daughter    Bipolar disorder Son    Social History   Socioeconomic History   Marital status: Divorced    Spouse name: Not on file   Number of children: Not on file   Years of education: Not on file   Highest education level: Not on file  Occupational History   Occupation: retired  Tobacco Use   Smoking status: Former    Current packs/day: 0.00    Average packs/day: 1.3 packs/day for 43.0 years (53.8 ttl pk-yrs)    Types: Cigarettes    Start date: 05/19/1977    Quit date: 05/19/2020    Years since quitting: 3.8    Passive exposure: Never   Smokeless tobacco: Never  Vaping Use   Vaping status: Never Used  Substance and Sexual Activity   Alcohol use: Yes    Comment: occ   Drug use: No   Sexual activity: Not on file  Other Topics Concern   Not on file  Social History Narrative   Not on file   Social Drivers of Health   Financial Resource Strain: Low Risk  (03/31/2024)   Overall Financial Resource Strain (CARDIA)    Difficulty of Paying Living Expenses: Not hard at all  Food Insecurity: No Food Insecurity (03/31/2024)   Hunger Vital Sign    Worried About Running Out of Food in the Last Year: Never true    Ran Out of Food in the Last Year: Never true  Transportation Needs: No Transportation Needs (03/31/2024)   PRAPARE - Administrator, Civil Service (Medical): No    Lack of Transportation (Non-Medical): No  Physical Activity: Inactive (03/31/2024)   Exercise Vital Sign    Days of Exercise per Week: 0 days    Minutes of Exercise per Session: 0 min  Stress: No Stress Concern Present (03/31/2024)   [employer] of Occupational Health - Occupational Stress Questionnaire    Feeling of Stress: Not at all  Social Connections:  Moderately Integrated (03/31/2024)   Social Connection and Isolation Panel    Frequency of Communication with Friends and Family: More than three times a week    Frequency of Social Gatherings with Friends and Family: More than three times a week    Attends Religious Services: More than 4 times per year    Active Member of [employer] or Organizations: Yes    Attends Banker Meetings: More than 4 times per year  Marital Status: Divorced    Tobacco Counseling Counseling given: Not Answered    Clinical Intake:  Pre-visit preparation completed: Yes  Pain : No/denies pain     BMI - recorded: 30.95 Nutritional Status: BMI > 30  Obese Nutritional Risks: None Diabetes: No  Lab Results  Component Value Date   HGBA1C 6.1 (A) 11/20/2023   HGBA1C 6.6 (H) 06/27/2023   HGBA1C 6.2 (A) 12/23/2022     How often do you need to have someone help you when you read instructions, pamphlets, or other written materials from your doctor or pharmacy?: 1 - Never  Interpreter Needed?: No  Information entered by :: Rojelio Blush LPN   Activities of Daily Living     03/31/2024    2:04 PM  In your present state of health, do you have any difficulty performing the following activities:  Hearing? 0  Vision? 0  Difficulty concentrating or making decisions? 0  Walking or climbing stairs? 1  Comment Uses a Cane  Dressing or bathing? 0  Doing errands, shopping? 0  Preparing Food and eating ? N  Using the Toilet? N  In the past six months, have you accidently leaked urine? N  Do you have problems with loss of bowel control? N  Managing your Medications? N  Managing your Finances? N  Housekeeping or managing your Housekeeping? N    Patient Care Team: Ozell Heron HERO, MD as PCP - General (Family Medicine) Elmira Newman PARAS, MD as PCP - Cardiology (Cardiology) Mannam, Praveen, MD as Consulting Physician (Pulmonary Disease) Garnell Harlene CROME, FNP as Referring Physician (Nurse  Practitioner) Melton Durwood RAMAN, DO as Referring Physician (Pain Medicine) Karoline Lima, RN as Case Manager  I have updated your Care Teams any recent Medical Services you may have received from other providers in the past year.     Assessment:   This is a routine wellness examination for Kennya.  Hearing/Vision screen Hearing Screening - Comments:: Denies hearing difficulties   Vision Screening - Comments:: Wears rx glasses - up to date with routine eye exams with  Dr Octavia   Goals Addressed               This Visit's Progress     Increase physical activity (pt-stated)        Lose weight       Depression Screen     03/31/2024    1:40 PM 03/28/2023    4:03 PM 01/13/2023    2:34 PM 12/23/2022    1:53 PM 10/17/2022    3:54 PM 06/27/2022    1:39 PM 02/05/2022    3:00 PM  PHQ 2/9 Scores  PHQ - 2 Score 0 2  2 1 4  0  PHQ- 9 Score  5  15  18  0  Exception Documentation   Patient refusal        Fall Risk     03/31/2024    2:05 PM 12/09/2023    1:51 PM 09/09/2023    2:18 PM 03/28/2023    4:06 PM 01/13/2023    2:34 PM  Fall Risk   Falls in the past year? 0 0 0 0 0  Number falls in past yr: 0   0 0  Injury with Fall? 0   0 0  Risk for fall due to : No Fall Risks   No Fall Risks   Follow up Falls evaluation completed   Falls prevention discussed Falls evaluation completed    MEDICARE RISK AT HOME:  Medicare Risk at Home Any stairs in or around the home?: No If so, are there any without handrails?: No Home free of loose throw rugs in walkways, pet beds, electrical cords, etc?: Yes Adequate lighting in your home to reduce risk of falls?: Yes Life alert?: No Use of a cane, walker or w/c?: No Grab bars in the bathroom?: No Shower chair or bench in shower?: Yes Elevated toilet seat or a handicapped toilet?: Yes  TIMED UP AND GO:  Was the test performed?  Yes  Length of time to ambulate 10 feet: 10 sec Gait slow and steady with assistive device  Cognitive Function:          03/31/2024    2:06 PM 03/28/2023    4:08 PM 02/05/2022    3:05 PM 01/24/2021    1:38 PM  6CIT Screen  What Year? 0 points 0 points 0 points 0 points  What month? 0 points 0 points 0 points 0 points  What time? 0 points 0 points 0 points   Count back from 20 0 points 0 points 0 points 0 points  Months in reverse 0 points 0 points 0 points 0 points  Repeat phrase 0 points 0 points 0 points 0 points  Total Score 0 points 0 points 0 points     Immunizations Immunization History  Administered Date(s) Administered   Influenza, High Dose Seasonal PF 08/25/2018   Moderna Sars-Covid-2 Vaccination 11/08/2019, 12/05/2019, 10/09/2020   Pneumococcal Conjugate-13 09/23/2018   Pneumococcal Polysaccharide-23 08/27/2016   Tdap 10/07/2012   Yellow Fever 07/05/2008    Screening Tests Health Maintenance  Topic Date Due   OPHTHALMOLOGY EXAM  Never done   Zoster Vaccines- Shingrix (1 of 2) Never done   DTaP/Tdap/Td (2 - Td or Tdap) 10/07/2022   COVID-19 Vaccine (4 - 2024-25 season) 06/08/2023   Pneumococcal Vaccine: 50+ Years (3 of 3 - PCV20 or PCV21) 09/24/2023   INFLUENZA VACCINE  05/07/2024   HEMOGLOBIN A1C  05/19/2024   Lung Cancer Screening  09/21/2024   Diabetic kidney evaluation - eGFR measurement  11/19/2024   Diabetic kidney evaluation - Urine ACR  11/19/2024   FOOT EXAM  11/19/2024   Medicare Annual Wellness (AWV)  03/31/2025   MAMMOGRAM  10/26/2025   Colonoscopy  08/01/2027   DEXA SCAN  Completed   Hepatitis C Screening  Completed   Hepatitis B Vaccines  Aged Out   HPV VACCINES  Aged Out   Meningococcal B Vaccine  Aged Out    Health Maintenance  Health Maintenance Due  Topic Date Due   OPHTHALMOLOGY EXAM  Never done   Zoster Vaccines- Shingrix (1 of 2) Never done   DTaP/Tdap/Td (2 - Td or Tdap) 10/07/2022   COVID-19 Vaccine (4 - 2024-25 season) 06/08/2023   Pneumococcal Vaccine: 50+ Years (3 of 3 - PCV20 or PCV21) 09/24/2023   Health Maintenance Items  Addressed: Patient deferred Eye Exam  Additional Screening:  Vision Screening: Recommended annual ophthalmology exams for early detection of glaucoma and other disorders of the eye. Would you like a referral to an eye doctor? No    Dental Screening: Recommended annual dental exams for proper oral hygiene  Community Resource Referral / Chronic Care Management: CRR required this visit?  No   CCM required this visit?  No   Plan:    I have personally reviewed and noted the following in the patient's chart:   Medical and social history Use of alcohol, tobacco or illicit drugs  Current medications  and supplements including opioid prescriptions. Patient is not currently taking opioid prescriptions. Functional ability and status Nutritional status Physical activity Advanced directives List of other physicians Hospitalizations, surgeries, and ER visits in previous 12 months Vitals Screenings to include cognitive, depression, and falls Referrals and appointments  In addition, I have reviewed and discussed with patient certain preventive protocols, quality metrics, and best practice recommendations. A written personalized care plan for preventive services as well as general preventive health recommendations were provided to patient.   Rojelio LELON Blush, LPN   3/74/7974   After Visit Summary: (In Person-Printed) AVS printed and given to the patient  Notes: Nothing significant to report at this time.

## 2024-04-06 ENCOUNTER — Encounter: Payer: Self-pay | Admitting: Physical Therapy

## 2024-04-06 ENCOUNTER — Ambulatory Visit (INDEPENDENT_AMBULATORY_CARE_PROVIDER_SITE_OTHER): Admitting: Physical Therapy

## 2024-04-06 DIAGNOSIS — M6281 Muscle weakness (generalized): Secondary | ICD-10-CM | POA: Diagnosis not present

## 2024-04-06 DIAGNOSIS — R262 Difficulty in walking, not elsewhere classified: Secondary | ICD-10-CM | POA: Diagnosis not present

## 2024-04-06 DIAGNOSIS — M5459 Other low back pain: Secondary | ICD-10-CM

## 2024-04-06 DIAGNOSIS — M25551 Pain in right hip: Secondary | ICD-10-CM

## 2024-04-06 DIAGNOSIS — M25561 Pain in right knee: Secondary | ICD-10-CM | POA: Diagnosis not present

## 2024-04-06 DIAGNOSIS — R293 Abnormal posture: Secondary | ICD-10-CM

## 2024-04-06 NOTE — Therapy (Signed)
 OUTPATIENT PHYSICAL THERAPY LOWER EXTREMITY EVALUATION   Patient Name: Judy [name] MRN: [id number] DOB:Jun 25, 1951, 73 y.o., female Today's Date: 04/06/2024  END OF SESSION:  PT End of Session - 04/06/24 1436     Visit Number 1    Number of Visits 20    Date for PT Re-Evaluation 06/18/24    Progress Note Due on Visit 10    PT Start Time 1345    PT Stop Time 1430    PT Time Calculation (min) 45 min    Activity Tolerance Patient limited by pain          Past Medical History:  Diagnosis Date   Anxiety    Arthritis    Avascular necrosis (HCC)    Left hip   Avascular necrosis of bone of hip, 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   Bipolar 1 disorder (HCC)    COPD (chronic obstructive pulmonary disease) (HCC)    Depression    Dyspnea    Pneumonia    PTSD (post-traumatic stress disorder)    Past Surgical History:  Procedure Laterality Date   bone spurs foot Right    CATARACT EXTRACTION Bilateral    COLONOSCOPY     COLONOSCOPY  07/2022   HAND SURGERY Left    JOINT REPLACEMENT     TONSILLECTOMY     TONSILLECTOMY AND ADENOIDECTOMY     TOTAL HIP ARTHROPLASTY Left 03/19/2019   Procedure: LEFT TOTAL HIP ARTHROPLASTY ANTERIOR APPROACH;  Surgeon: Vernetta Lonni GRADE, MD;  Location: [facility];  Service: Orthopedics;  Laterality: Left;   TOTAL HIP ARTHROPLASTY Right 06/04/2019   Procedure: RIGHT TOTAL HIP ARTHROPLASTY ANTERIOR APPROACH;  Surgeon: Vernetta Lonni GRADE, MD;  Location: [facility];  Service: Orthopedics;  Laterality: Right;   TOTAL KNEE ARTHROPLASTY Left 12/28/2021   Procedure: Left TOTAL KNEE ARTHROPLASTY;  Surgeon: Vernetta Lonni GRADE, MD;  Location: [facility];  Service: Orthopedics;  Laterality: Left;   Patient Active Problem List   Diagnosis Date Noted   Coronary artery disease involving native coronary artery of native heart without angina pectoris 03/08/2024   Aortic atherosclerosis (HCC) 03/08/2024    Unilateral primary osteoarthritis, right knee 12/31/2023   Hyperlipidemia 07/04/2023   Diabetes mellitus without complication (HCC) 08/09/2022   Restless legs syndrome 08/09/2022   Status post total left knee replacement 12/28/2021   Primary osteoarthritis of left knee 10/25/2021   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) 11/30/2020   Status post total replacement of right hip 06/04/2019   Status post total replacement of left hip 03/19/2019   PTSD (post-traumatic stress disorder)    COPD (chronic obstructive pulmonary disease) (HCC)    Bipolar 1 disorder (HCC)    Anxiety    B12 deficiency 12/09/2018   Hyperglycemia 04/14/2017    PCP: Ozell Heron HERO, MD   REFERRING PROVIDER: Dolphus Reiter, MD   REFERRING DIAG:  M17.11 (ICD-10-CM) - Primary osteoarthritis of right knee  R26.81 (ICD-10-CM) - Unstable gait    THERAPY DIAG:  Acute pain of right knee  Muscle weakness (generalized)  Difficulty in walking, not elsewhere classified  Abnormal posture  Pain in right hip  Other low back pain  Rationale for Evaluation and Treatment: Rehabilitation  ONSET DATE: Months ago  SUBJECTIVE:   SUBJECTIVE STATEMENT: Pt arriving today reporting worsening pain in her Rt knee, Rt hip and low back. Pt stating she can barely get through the grocery store without a scooter. Pt stating she is  getting an injection in her low back in about 2 weeks. Pt stating she has not been sleeping.   PERTINENT HISTORY: History of Present Illness: Judy [name] is a 73 y.o. female with ankylosing spondylitis, osteoarthritis and degenerative disc disease.  She had been receiving right knee joint viscosupplement injections at the [facility].  She has noticed improvement in her right knee joint pain.  Her left knee joint had replacement in 2023 which continues to hurt.  She states she has been noticing increased discomfort and swelling in her left knee and left foot.  She  believes her left arm is swollen.  She had taken Cimzia  in the past and did not notice any difference on Cimzia .  She continues to have stiffness in her neck and back.  She states she was seen at the pain clinic and had facet joint injection in her lumbar spine 3 weeks ago which was helpful.  She is followed by Dr. Theophilus for COPD.  She is followed by cardiology for coronary artery disease.   PMH: COPD, left TKA, Rt THA, left THA, hand surgery, Bipolar, depression, avascular necrosis,  PAIN:  NPRS scale: 3/10 overall today, 10/10 at worse in back, 8/10 worse pain in Rt knee Pain description: achy Aggravating factors: certain days are worse than other Relieving factors: over the counter meds, muscle relaxer, ice, nothing helps fully  PRECAUTIONS: Other: COPD  WEIGHT BEARING RESTRICTIONS: No  FALLS:  Has patient fallen in last 6 months? No  LIVING ENVIRONMENT: Lives with: lives with their family and lives alone Lives in: House/apartment Stairs: Yes: External: 4 steps; can reach both Has following equipment at home: Single point cane, shower chair, raised toilet seat  OCCUPATION: retired  PLOF: Independent  PATIENT GOALS: walk better and longer distances  Next MD visit:   OBJECTIVE:   DIAGNOSTIC FINDINGS:  10/28/2022:  1. Increased T2 signal in the T12-L1 disc, with some loss of cortical signal in the T12-L1 endplates. While this could be degenerative in nature, early discitis/osteomyelitis could have a similar appearance. Recommend correlation with inflammatory markers. 2. L3-L4 mild-to-moderate spinal canal stenosis and mild left neural foraminal narrowing, which is new from the prior exam. Narrowing of the lateral recesses at this level could affect the descending L4 nerve roots. 3. L4-L5 narrowing of the left lateral recess, which could affect the descending left L5 nerve. 4. L5-S1 large left lateral osteophyte, which may abut the exiting left L5 nerve. 5. Atrophy of  the right psoas muscle, which is new from the prior exam.  PATIENT SURVEYS:  Patient-Specific Activity Scoring Scheme  0 represents "unable to perform." 10 represents "able to perform at prior level. 0 1 2 3 4 5 6 7 8 9  10 (Date and Score)   Activity Eval  04/06/24    1. Walk without can 3     2. Shower without sitting  1    3. Shop without electric cart 3   4.minor cleaning tasks 3   5.standing >/= 10 minutes 3   Score 4.6    Total score = sum of the activity scores/number of activities Minimum detectable change (90%CI) for average score = 2 points Minimum detectable change (90%CI) for single activity score = 3 points  COGNITION: Overall cognitive status: WFL    SENSATION: WFL  POSTURE:  rounded shoulders, forward head, and decreased lumbar lordosis  PALPATION: 04/06/24: TTP Rt lumbar paraspinals and QL, lateral Rt hip and joint line of Rt knee.   LOWER  EXTREMITY ROM:   ROM Right Eval 04/06/24 Left eval  Hip flexion 90 c pain In low back and Rt hip 98 c pain in low back  Hip extension    Hip abduction    Hip adduction    Hip internal rotation    Hip external rotation    Knee flexion 125 130  Knee extension -5 -2  Ankle dorsiflexion    Ankle plantarflexion    Ankle inversion    Ankle eversion     (Blank rows = not tested)  LOWER EXTREMITY MMT:  MMT Right Eval 04/06/24 Left eval  Hip flexion 15.2 lbs 18.9 lbs  Hip extension    Hip abduction    Hip adduction    Hip internal rotation    Hip external rotation    Knee flexion 24.9 lbs 21.0 lbs  Knee extension 33.2 lbs 43.5 lbs  Ankle dorsiflexion    Ankle plantarflexion    Ankle inversion    Ankle eversion     (Blank rows = not tested)    FUNCTIONAL TESTS:  04/06/24: 5 time sit to stand: 25.71 seconds c UE support  GAIT: Distance walked: clinic distances  Assistive device utilized: Single point cane Level of assistance: Modified independence Comments: antalgic gait with mild  trendelenburg                                                                                                                                                                         TODAY'S TREATMENT                                                                          DATE: 04/06/24 Therex: HEP instruction/performance c cues for techniques, handout provided.  Trial set performed of each for comprehension and symptom assessment.  See below for exercise list  PATIENT EDUCATION:  Education details: HEP, POC Person educated: Patient Education method: Explanation, Demonstration, Verbal cues, and Handouts Education comprehension: verbalized understanding, returned demonstration, and verbal cues required  HOME EXERCISE PROGRAM: Access Code: [email or web address] URL: [email or web address] Date: 04/06/2024 Prepared by: Delon Lunger  Exercises - Seated Long Arc Quad  - 1-2 x daily - 7 x weekly - 10 reps - Seated Small Alternating Straight Leg Lifts with Heel Touch  - 1-2 x daily - 7 x weekly - 10 reps - Supine Knee Extension Strengthening  - 1-2 x daily - 7 x weekly - 10 reps - 3 seconds hold  ASSESSMENT:  CLINICAL IMPRESSION: Patient is a 73 y.o. who comes to clinic with complaints of Rt knee, Rt hip and low back pain. Pt presents with mobility, strength and movement coordination deficits that impair their ability to perform usual daily and recreational functional activities without increase difficulty/symptoms at this time.  Patient to benefit from skilled PT services to address impairments and limitations to improve to previous level of function without restriction secondary to condition.   OBJECTIVE IMPAIRMENTS: decreased balance, decreased mobility, difficulty walking, decreased ROM, decreased strength, impaired flexibility, and pain.   ACTIVITY LIMITATIONS: lifting, bending, sitting, standing, squatting, sleeping, and stairs  PARTICIPATION LIMITATIONS: meal prep, cleaning,  laundry, and community activity  PERSONAL FACTORS: 3+ comorbidities: see PMH above are also affecting patient's functional outcome.   REHAB POTENTIAL: Fair  to good, based on pt's significant co-morbidities and PMH see above  CLINICAL DECISION MAKING: Stable/uncomplicated  EVALUATION COMPLEXITY: Moderate   GOALS: Goals reviewed with patient? Yes  SHORT TERM GOALS: (target date for Short term goals are 3 weeks 04/27/2024)   1.  Patient will demonstrate independent use of home exercise program to maintain progress from in clinic treatments.  Goal status: New  LONG TERM GOALS: (target dates for all long term goals are 10 weeks  06/18/2024 )   1. Patient will demonstrate/report pain at worst less than or equal to 2/10 to facilitate minimal limitation in daily activity secondary to pain symptoms.  Goal status: New   2. Patient will demonstrate independent use of home exercise program to facilitate ability to maintain/progress functional gains from skilled physical therapy services.  Goal status: New   3. Patient will demonstrate Patient specific functional scale avg > or = 4.6 to indicate reduced disability due to condition.   Goal status: New   4.  Patient will demonstrate Rt  LE MMT by >/= 5 lbs from baseline throughout to faciltiate usual transfers, stairs, squatting at [facility] for daily life.   Goal status: New   5.  Patient will demonstrate up and down 4 steps with step to gait pattern using hand rail for support.  Goal status: New       PLAN:  PT FREQUENCY: 1-2x/week  PT DURATION: 10 weeks  PLANNED INTERVENTIONS: Can include 02853- PT Re-evaluation, 97110-Therapeutic exercises, 97530- Therapeutic activity, [id number]- Neuromuscular re-education, 97535- Self Care, 97140- Manual therapy, [phone]- Gait training, [phone]- Orthotic Fit/training, [phone]- Canalith repositioning, [id number]- Aquatic Therapy, [id number]- Electrical stimulation (unattended), [id number] Physical performance testing, 97016-  Vasopneumatic device, [id number]- Ultrasound, [id number]- Traction (mechanical), [id number]- Ionotophoresis 4mg /ml Dexamethasone ,  79439 - Needle insertion w/o injection 1 or 2 muscles, 20561 - Needle insertion w/o injection 3 or more muscles.   Patient/Family education, Balance training, Stair training, Taping, Dry Needling, Joint mobilization, Joint manipulation, Spinal manipulation, Spinal mobilization, Scar mobilization, Vestibular training, Visual/preceptual remediation/compensation, DME instructions, Cryotherapy, and Moist heat.  All performed as medically necessary.  All included unless contraindicated  PLAN FOR NEXT SESSION: Review HEP knowledge/results. Quad strengthening  Measure lumbar ROM and perform BERG balance test and create goal as necessary    Delon JONELLE Lunger, PT, MPT 04/06/2024, 2:38 PM

## 2024-04-14 ENCOUNTER — Telehealth: Payer: Self-pay

## 2024-04-14 NOTE — Telephone Encounter (Signed)
 I have re sent all of the need information. NFN

## 2024-04-14 NOTE — Telephone Encounter (Signed)
 Copied from CRM [id number]. Topic: General - Other >> Apr 12, 2024  3:04 PM Shona S wrote: Reason for CRM: patient can't schedule appointment with orthodontist until dr theophilus send over her referral and copy of sleep study. Dr micky, Phone  [phone],  Fax [phone]   PCC's, please advise. Referral was placed 03/04/2024

## 2024-04-21 ENCOUNTER — Ambulatory Visit: Payer: Self-pay

## 2024-04-21 ENCOUNTER — Telehealth: Payer: Self-pay

## 2024-04-21 DIAGNOSIS — M45 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: Secondary | ICD-10-CM

## 2024-04-21 NOTE — Telephone Encounter (Signed)
 FYI Only or Action Required?: Action required by provider: clinical question for provider.-pt calling about next steps, states that the medication she was initially given was not helpful and is now gone.   Patient was last seen in primary care on 12/25/2023 by Ozell Heron HERO, MD.  Called Nurse Triage reporting Leg Pain.  Symptoms began several months ago.  Interventions attempted: rx given by pcp  Symptoms are: gradually worsening.  Triage Disposition: See PCP Within 2 Weeks  Patient/caregiver understands and will follow disposition?: No, wishes to speak with PCP  Copied from CRM [id number]. Topic: Clinical - Red Word Triage >> Apr 21, 2024  3:59 PM Lavanda D wrote: Red Word that prompted transfer to Nurse Triage: Extreme Pain: Up half the night due to unbearable pain, primarily at night but happens during the day too at times: Patient is still experiencing severe muscle cramps,  side of calf and back of thigh *right* gets hard as a rock when it cramps and she is unable to walk. She had a previous medication that is now gone but she said it wasn't very helpful anyways, she had a friend mention potassium and is wondering if that's an option or any medications that Dr. Ozell would recommend. Reason for Disposition  Leg pain or muscle cramp is a chronic symptom (recurrent or ongoing AND present > 4 weeks)  Answer Assessment - Initial Assessment Questions 1. ONSET: When did the pain start?      Intermittently for 2-3 months, worse last couple of weeks 2. LOCATION: Where is the pain located?      R leg, thigh  3. PAIN: How bad is the pain?    (Scale 1-10; or mild, moderate, severe)     severe 5. CAUSE: What do you think is causing the leg pain?     Unsure, maybe potassium 6. OTHER SYMPTOMS: Do you have any other symptoms? (e.g., chest pain, back pain, breathing difficulty, swelling, rash, fever, numbness, weakness)     denies  Protocols used: Leg Pain-A-AH

## 2024-04-21 NOTE — Telephone Encounter (Signed)
 Copied from CRM [id number]. Topic: Referral - Request for Referral >> Apr 21, 2024 11:25 AM Russell PARAS wrote: Did the patient discuss referral with their provider in the last year? Yes (If No - schedule appointment) (If Yes - send message)  Appointment offered? No  Type of order/referral and detailed reason for visit: Dentistry, was originally sent referral to Dr. Oneil Forget; however, she did not attend appt. She is currently being seen at [facility] [address], and has found provider in that office who can consult with her but will need a referral sent to their office. She is unsure of the provider's name but will contact their office and contact clinic with information.  Preference of office, provider, location:   [facility] [address]  [facility] [place], [place] [address]  CB# [phone]  If referral order, have you been seen by this specialty before? Yes (If Yes, this issue or another issue? When? Where?  Can we respond through MyChart? Yes    PCC's, please send referral to [facility]. Is a new referral needed?

## 2024-04-22 NOTE — Telephone Encounter (Signed)
 I believe she is being seen by the pain management clinic [facility] so she can contact them, her appt with me isn't until August so if she needs to get on the cancellation list that would be fine, or we could try to bump up her appt to an earlier time. I also can refill the tizanidine  for her if it was helping some.SABRA

## 2024-04-22 NOTE — Telephone Encounter (Signed)
 Called office to get providers name to be sent over had to LVM with contact info. Will send referral over once I get providers info

## 2024-04-22 NOTE — Telephone Encounter (Signed)
 Patient informed of the message below.  Patient was offered a number of appt times from 7/21 and declined as she had other appts.  Appt was scheduled on 7/28.

## 2024-04-22 NOTE — Telephone Encounter (Signed)
 Message sent to PCP as patient stated she would like to have Tizanidine  sent to [facility].

## 2024-04-23 ENCOUNTER — Other Ambulatory Visit: Payer: Self-pay | Admitting: Family Medicine

## 2024-04-23 DIAGNOSIS — M45 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: Secondary | ICD-10-CM

## 2024-04-23 MED ORDER — TIZANIDINE HCL 4 MG PO TABS: 4.0000 mg | ORAL_TABLET | Freq: Three times a day (TID) | ORAL | 2 refills | Status: DC | PRN

## 2024-04-23 NOTE — Telephone Encounter (Signed)
 Script sent

## 2024-04-23 NOTE — Telephone Encounter (Signed)
 Referral has been faxed to [facility] Dr. Aguero

## 2024-04-26 ENCOUNTER — Ambulatory Visit (INDEPENDENT_AMBULATORY_CARE_PROVIDER_SITE_OTHER): Admitting: Physical Therapy

## 2024-04-26 ENCOUNTER — Encounter: Payer: Self-pay | Admitting: Physical Therapy

## 2024-04-26 DIAGNOSIS — R293 Abnormal posture: Secondary | ICD-10-CM

## 2024-04-26 DIAGNOSIS — M6281 Muscle weakness (generalized): Secondary | ICD-10-CM | POA: Diagnosis not present

## 2024-04-26 DIAGNOSIS — M25561 Pain in right knee: Secondary | ICD-10-CM

## 2024-04-26 DIAGNOSIS — R262 Difficulty in walking, not elsewhere classified: Secondary | ICD-10-CM

## 2024-04-26 NOTE — Therapy (Signed)
 OUTPATIENT PHYSICAL THERAPY TREATMENT   Patient Name: Judy [name] MRN: [id number] DOB:1951/07/16, 73 y.o., female Today's Date: 04/26/2024  END OF SESSION:  PT End of Session - 04/26/24 1151     Visit Number 2    Number of Visits 20    Date for PT Re-Evaluation 06/18/24    Authorization Type Medicare/[employer]    Progress Note Due on Visit 10    PT Start Time 1151   pt arrived late   PT Stop Time 1229    PT Time Calculation (min) 38 min    Activity Tolerance Patient limited by pain           Past Medical History:  Diagnosis Date   Anxiety    Arthritis    Avascular necrosis (HCC)    Left hip   Avascular necrosis of bone of hip, 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   Bipolar 1 disorder (HCC)    COPD (chronic obstructive pulmonary disease) (HCC)    Depression    Dyspnea    Pneumonia    PTSD (post-traumatic stress disorder)    Past Surgical History:  Procedure Laterality Date   bone spurs foot Right    CATARACT EXTRACTION Bilateral    COLONOSCOPY     COLONOSCOPY  07/2022   HAND SURGERY Left    JOINT REPLACEMENT     TONSILLECTOMY     TONSILLECTOMY AND ADENOIDECTOMY     TOTAL HIP ARTHROPLASTY Left 03/19/2019   Procedure: LEFT TOTAL HIP ARTHROPLASTY ANTERIOR APPROACH;  Surgeon: Vernetta Lonni GRADE, MD;  Location: [facility];  Service: Orthopedics;  Laterality: Left;   TOTAL HIP ARTHROPLASTY Right 06/04/2019   Procedure: RIGHT TOTAL HIP ARTHROPLASTY ANTERIOR APPROACH;  Surgeon: Vernetta Lonni GRADE, MD;  Location: [facility];  Service: Orthopedics;  Laterality: Right;   TOTAL KNEE ARTHROPLASTY Left 12/28/2021   Procedure: Left TOTAL KNEE ARTHROPLASTY;  Surgeon: Vernetta Lonni GRADE, MD;  Location: [facility];  Service: Orthopedics;  Laterality: Left;   Patient Active Problem List   Diagnosis Date Noted   Coronary artery disease involving native coronary artery of native heart without angina pectoris 03/08/2024    Aortic atherosclerosis (HCC) 03/08/2024   Unilateral primary osteoarthritis, right knee 12/31/2023   Hyperlipidemia 07/04/2023   Diabetes mellitus without complication (HCC) 08/09/2022   Restless legs syndrome 08/09/2022   Status post total left knee replacement 12/28/2021   Primary osteoarthritis of left knee 10/25/2021   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) 11/30/2020   Status post total replacement of right hip 06/04/2019   Status post total replacement of left hip 03/19/2019   PTSD (post-traumatic stress disorder)    COPD (chronic obstructive pulmonary disease) (HCC)    Bipolar 1 disorder (HCC)    Anxiety    B12 deficiency 12/09/2018   Hyperglycemia 04/14/2017    PCP: Ozell Heron HERO, MD   REFERRING PROVIDER: Dolphus Reiter, MD   REFERRING DIAG:  M17.11 (ICD-10-CM) - Primary osteoarthritis of right knee  R26.81 (ICD-10-CM) - Unstable gait    THERAPY DIAG:  Acute pain of right knee  Muscle weakness (generalized)  Difficulty in walking, not elsewhere classified  Abnormal posture  Rationale for Evaluation and Treatment: Rehabilitation  ONSET DATE: Months ago  SUBJECTIVE:   SUBJECTIVE STATEMENT: Had injection in Rt knee and low back about 2 weeks ago; felt like it helped for a few days but now it's going back downhill.  PERTINENT HISTORY: History of  Present Illness: Judy [name] is a 73 y.o. female with ankylosing spondylitis, osteoarthritis and degenerative disc disease.  She had been receiving right knee joint viscosupplement injections at the [facility] pain center.  She has noticed improvement in her right knee joint pain.  Her left knee joint had replacement in 2023 which continues to hurt.  She states she has been noticing increased discomfort and swelling in her left knee and left foot.  She believes her left arm is swollen.  She had taken Cimzia  in the past and did not notice any difference on Cimzia .  She continues to have  stiffness in her neck and back.  She states she was seen at the pain clinic and had facet joint injection in her lumbar spine 3 weeks ago which was helpful.  She is followed by Dr. Theophilus for COPD.  She is followed by cardiology for coronary artery disease.   PMH: COPD, left TKA, Rt THA, left THA, hand surgery, Bipolar, depression, avascular necrosis,  PAIN:  NPRS scale: 3/10 overall today, 10/10 at worse in back, 8/10 worse pain in Rt knee Pain description: achy Aggravating factors: certain days are worse than other Relieving factors: over the counter meds, muscle relaxer, ice, nothing helps fully  PRECAUTIONS: Other: COPD  WEIGHT BEARING RESTRICTIONS: No  FALLS:  Has patient fallen in last 6 months? No  LIVING ENVIRONMENT: Lives with: lives with their family and lives alone Lives in: House/apartment Stairs: Yes: External: 4 steps; can reach both Has following equipment at home: Single point cane, shower chair, raised toilet seat  OCCUPATION: retired  PLOF: Independent  PATIENT GOALS: walk better and longer distances  Next MD visit:   OBJECTIVE:   DIAGNOSTIC FINDINGS:  10/28/2022:  1. Increased T2 signal in the T12-L1 disc, with some loss of cortical signal in the T12-L1 endplates. While this could be degenerative in nature, early discitis/osteomyelitis could have a similar appearance. Recommend correlation with inflammatory markers. 2. L3-L4 mild-to-moderate spinal canal stenosis and mild left neural foraminal narrowing, which is new from the prior exam. Narrowing of the lateral recesses at this level could affect the descending L4 nerve roots. 3. L4-L5 narrowing of the left lateral recess, which could affect the descending left L5 nerve. 4. L5-S1 large left lateral osteophyte, which may abut the exiting left L5 nerve. 5. Atrophy of the right psoas muscle, which is new from the prior exam.  PATIENT SURVEYS:  Patient-Specific Activity Scoring Scheme  0 represents  "unable to perform." 10 represents "able to perform at prior level. 0 1 2 3 4 5 6 7 8 9  10 (Date and Score)   Activity Eval  04/06/24    1. Walk without can 3     2. Shower without sitting  1    3. Shop without electric cart 3   4.minor cleaning tasks 3   5.standing >/= 10 minutes 3   Score 4.6    Total score = sum of the activity scores/number of activities Minimum detectable change (90%CI) for average score = 2 points Minimum detectable change (90%CI) for single activity score = 3 points  COGNITION: Overall cognitive status: WFL    SENSATION: WFL  POSTURE:  rounded shoulders, forward head, and decreased lumbar lordosis  PALPATION: 04/06/24: TTP Rt lumbar paraspinals and QL, lateral Rt hip and joint line of Rt knee.   LOWER EXTREMITY ROM:   ROM Right Eval 04/06/24 Left eval  Hip flexion 90 c pain In low back and Rt hip 98 c  pain in low back  Hip extension    Hip abduction    Hip adduction    Hip internal rotation    Hip external rotation    Knee flexion 125 130  Knee extension -5 -2  Ankle dorsiflexion    Ankle plantarflexion    Ankle inversion    Ankle eversion     (Blank rows = not tested)  LOWER EXTREMITY MMT:  MMT Right Eval 04/06/24 Left eval  Hip flexion 15.2 lbs 18.9 lbs  Hip extension    Hip abduction    Hip adduction    Hip internal rotation    Hip external rotation    Knee flexion 24.9 lbs 21.0 lbs  Knee extension 33.2 lbs 43.5 lbs  Ankle dorsiflexion    Ankle plantarflexion    Ankle inversion    Ankle eversion     (Blank rows = not tested)    FUNCTIONAL TESTS:  04/06/24: 5 time sit to stand: 25.71 seconds c UE support  GAIT: Distance walked: clinic distances  Assistive device utilized: Single point cane Level of assistance: Modified independence Comments: antalgic gait with mild trendelenburg                                                                                                                                                                          TODAY'S TREATMENT                                                                          DATE:  04/26/24 TherEx Recumbent bike L4 x 3 min - had to stop due to pain Seated SLR 2x10 bil; 3 sec hold Seated LAQ 2x10; 3# bil Supine SAQ 2x10; 3# bil Supine bridges x 3; 5 sec hold -had to stop due to pain  TherAct Seated chest press with 6# ball 2x10 Sit to/from stand from elevated surface without UE support 2x10    04/06/24 Therex: HEP instruction/performance c cues for techniques, handout provided.  Trial set performed of each for comprehension and symptom assessment.  See below for exercise list  PATIENT EDUCATION:  Education details: HEP, POC Person educated: Patient Education method: Explanation, Demonstration, Verbal cues, and Handouts Education comprehension: verbalized understanding, returned demonstration, and verbal cues required  HOME EXERCISE PROGRAM: Access Code: [email or web address] URL: [email or web address] Date: 04/06/2024 Prepared by: Delon Lunger  Exercises - Seated Long Arc Quad  - 1-2 x daily - 7 x weekly - 10 reps - Seated  Small Alternating Straight Leg Lifts with Heel Touch  - 1-2 x daily - 7 x weekly - 10 reps - Supine Knee Extension Strengthening  - 1-2 x daily - 7 x weekly - 10 reps - 3 seconds hold  ASSESSMENT:  CLINICAL IMPRESSION: Pt needing frequent rest breaks during session due to SOB and fatigue.  Overall min cues needed for HEP review, no goals met at this time.    OBJECTIVE IMPAIRMENTS: decreased balance, decreased mobility, difficulty walking, decreased ROM, decreased strength, impaired flexibility, and pain.   ACTIVITY LIMITATIONS: lifting, bending, sitting, standing, squatting, sleeping, and stairs  PARTICIPATION LIMITATIONS: meal prep, cleaning, laundry, and community activity  PERSONAL FACTORS: 3+ comorbidities: see PMH above are also affecting patient's functional outcome.   REHAB POTENTIAL: Fair   to good, based on pt's significant co-morbidities and PMH see above  CLINICAL DECISION MAKING: Stable/uncomplicated  EVALUATION COMPLEXITY: Moderate   GOALS: Goals reviewed with patient? Yes  SHORT TERM GOALS: (target date for Short term goals are 3 weeks 04/27/2024)   1.  Patient will demonstrate independent use of home exercise program to maintain progress from in clinic treatments. Goal status: New  LONG TERM GOALS: (target dates for all long term goals are 10 weeks  06/18/2024 )   1. Patient will demonstrate/report pain at worst less than or equal to 2/10 to facilitate minimal limitation in daily activity secondary to pain symptoms.  Goal status: New   2. Patient will demonstrate independent use of home exercise program to facilitate ability to maintain/progress functional gains from skilled physical therapy services.  Goal status: New   3. Patient will demonstrate Patient specific functional scale avg > or = 4.6 to indicate reduced disability due to condition.   Goal status: New   4.  Patient will demonstrate Rt  LE MMT by >/= 5 lbs from baseline throughout to faciltiate usual transfers, stairs, squatting at [facility] for daily life.   Goal status: New   5.  Patient will demonstrate up and down 4 steps with step to gait pattern using hand rail for support.  Goal status: New       PLAN:  PT FREQUENCY: 1-2x/week  PT DURATION: 10 weeks  PLANNED INTERVENTIONS: Can include 02853- PT Re-evaluation, 97110-Therapeutic exercises, 97530- Therapeutic activity, [id number]- Neuromuscular re-education, 97535- Self Care, 97140- Manual therapy, [phone]- Gait training, [phone]- Orthotic Fit/training, [phone]- Canalith repositioning, [id number]- Aquatic Therapy, [phone]- Electrical stimulation (unattended), [id number] Physical performance testing, 97016- Vasopneumatic device, [id number]- Ultrasound, [id number]- Traction (mechanical), [id number]- Ionotophoresis 4mg /ml Dexamethasone ,  [id number] - Needle insertion w/o injection 1 or 2  muscles, 20561 - Needle insertion w/o injection 3 or more muscles.   Patient/Family education, Balance training, Stair training, Taping, Dry Needling, Joint mobilization, Joint manipulation, Spinal manipulation, Spinal mobilization, Scar mobilization, Vestibular training, Visual/preceptual remediation/compensation, DME instructions, Cryotherapy, and Moist heat.  All performed as medically necessary.  All included unless contraindicated  PLAN FOR NEXT SESSION: Quad strengthening  Measure lumbar ROM and perform BERG balance test and create goal as necessary (deferred due to SOB/fatigue)    [name], PT, DPT 04/26/24 12:57 PM

## 2024-04-27 ENCOUNTER — Telehealth: Payer: Self-pay

## 2024-04-27 NOTE — Telephone Encounter (Signed)
 Results have been reviewed by Ruthell, NP. Please review results below with patient. Advise patient that even though her results were read as a Rads 2, provider prefers patient complete a repeat scan in 6 months. Nodules are waxing and waning. Largest nodule 13.8 mm. She would like to evaluate closer for stability. Place order, scan will be due around 09/28/2024. Send results and plan to PCP.   IMPRESSION: 1. Lung-RADS 2, benign appearance or behavior. Continue annual screening with low-dose chest CT without contrast in 12 months. 2. Mild bronchiectasis and bronchiolectasis with scattered peripheral areas of interstitial reticulation and ground-glass attenuation, similar to previous exam. Imaging findings are suggestive of chronic interstitial lung disease such as nonspecific interstitial pneumonia. 3. Coronary artery calcifications. 4. Hepatic steatosis. 5. Aortic Atherosclerosis (ICD10-I70.0) and Emphysema (ICD10-J43.9).

## 2024-04-27 NOTE — Telephone Encounter (Signed)
 LVM to call office and review recent lung CT results.

## 2024-04-28 ENCOUNTER — Other Ambulatory Visit: Payer: Self-pay

## 2024-04-28 DIAGNOSIS — R911 Solitary pulmonary nodule: Secondary | ICD-10-CM

## 2024-04-28 DIAGNOSIS — Z87891 Personal history of nicotine dependence: Secondary | ICD-10-CM

## 2024-04-28 DIAGNOSIS — Z122 Encounter for screening for malignant neoplasm of respiratory organs: Secondary | ICD-10-CM

## 2024-04-28 NOTE — Telephone Encounter (Signed)
 Spoke with patient and reviewed results below. She will complete a follow up scan 10/05/2024. Order placed. Results and plan to PCP. NFN.

## 2024-04-29 ENCOUNTER — Encounter: Payer: Self-pay | Admitting: Physical Therapy

## 2024-04-29 ENCOUNTER — Ambulatory Visit (INDEPENDENT_AMBULATORY_CARE_PROVIDER_SITE_OTHER): Admitting: Physical Therapy

## 2024-04-29 DIAGNOSIS — R262 Difficulty in walking, not elsewhere classified: Secondary | ICD-10-CM

## 2024-04-29 DIAGNOSIS — R293 Abnormal posture: Secondary | ICD-10-CM | POA: Diagnosis not present

## 2024-04-29 DIAGNOSIS — M25561 Pain in right knee: Secondary | ICD-10-CM | POA: Diagnosis not present

## 2024-04-29 DIAGNOSIS — M25551 Pain in right hip: Secondary | ICD-10-CM

## 2024-04-29 DIAGNOSIS — M6281 Muscle weakness (generalized): Secondary | ICD-10-CM | POA: Diagnosis not present

## 2024-04-29 NOTE — Therapy (Signed)
 OUTPATIENT PHYSICAL THERAPY TREATMENT   Patient Name: Judy [name] MRN: [id number] DOB:1950/11/21, 73 y.o., female Today's Date: 04/29/2024  END OF SESSION:  PT End of Session - 04/29/24 1435     Visit Number 3    Number of Visits 20    Date for PT Re-Evaluation 06/18/24    Authorization Type Medicare/[employer]    Progress Note Due on Visit 10    PT Start Time 1434    PT Stop Time 1512    PT Time Calculation (min) 38 min    Activity Tolerance Patient limited by pain            Past Medical History:  Diagnosis Date   Anxiety    Arthritis    Avascular necrosis (HCC)    Left hip   Avascular necrosis of bone of hip, 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   Bipolar 1 disorder (HCC)    COPD (chronic obstructive pulmonary disease) (HCC)    Depression    Dyspnea    Pneumonia    PTSD (post-traumatic stress disorder)    Past Surgical History:  Procedure Laterality Date   bone spurs foot Right    CATARACT EXTRACTION Bilateral    COLONOSCOPY     COLONOSCOPY  07/2022   HAND SURGERY Left    JOINT REPLACEMENT     TONSILLECTOMY     TONSILLECTOMY AND ADENOIDECTOMY     TOTAL HIP ARTHROPLASTY Left 03/19/2019   Procedure: LEFT TOTAL HIP ARTHROPLASTY ANTERIOR APPROACH;  Surgeon: Vernetta Lonni GRADE, MD;  Location: [facility];  Service: Orthopedics;  Laterality: Left;   TOTAL HIP ARTHROPLASTY Right 06/04/2019   Procedure: RIGHT TOTAL HIP ARTHROPLASTY ANTERIOR APPROACH;  Surgeon: Vernetta Lonni GRADE, MD;  Location: [facility];  Service: Orthopedics;  Laterality: Right;   TOTAL KNEE ARTHROPLASTY Left 12/28/2021   Procedure: Left TOTAL KNEE ARTHROPLASTY;  Surgeon: Vernetta Lonni GRADE, MD;  Location: [facility];  Service: Orthopedics;  Laterality: Left;   Patient Active Problem List   Diagnosis Date Noted   Coronary artery disease involving native coronary artery of native heart without angina pectoris 03/08/2024   Aortic  atherosclerosis (HCC) 03/08/2024   Unilateral primary osteoarthritis, right knee 12/31/2023   Hyperlipidemia 07/04/2023   Diabetes mellitus without complication (HCC) 08/09/2022   Restless legs syndrome 08/09/2022   Status post total left knee replacement 12/28/2021   Primary osteoarthritis of left knee 10/25/2021   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) 11/30/2020   Status post total replacement of right hip 06/04/2019   Status post total replacement of left hip 03/19/2019   PTSD (post-traumatic stress disorder)    COPD (chronic obstructive pulmonary disease) (HCC)    Bipolar 1 disorder (HCC)    Anxiety    B12 deficiency 12/09/2018   Hyperglycemia 04/14/2017    PCP: Ozell Heron HERO, MD   REFERRING PROVIDER: Dolphus Reiter, MD   REFERRING DIAG:  M17.11 (ICD-10-CM) - Primary osteoarthritis of right knee  R26.81 (ICD-10-CM) - Unstable gait    THERAPY DIAG:  Acute pain of right knee  Muscle weakness (generalized)  Difficulty in walking, not elsewhere classified  Abnormal posture  Pain in right hip  Rationale for Evaluation and Treatment: Rehabilitation  ONSET DATE: Months ago  SUBJECTIVE:   SUBJECTIVE STATEMENT: Had to sit in the dentists chair for 2 hours earlier today   PERTINENT HISTORY: History of Present Illness: Judy [name] is a 73 y.o. female with  ankylosing spondylitis, osteoarthritis and degenerative disc disease.  She had been receiving right knee joint viscosupplement injections at the [facility] pain center.  She has noticed improvement in her right knee joint pain.  Her left knee joint had replacement in 2023 which continues to hurt.  She states she has been noticing increased discomfort and swelling in her left knee and left foot.  She believes her left arm is swollen.  She had taken Cimzia  in the past and did not notice any difference on Cimzia .  She continues to have stiffness in her neck and back.  She states she was seen at  the pain clinic and had facet joint injection in her lumbar spine 3 weeks ago which was helpful.  She is followed by Dr. Theophilus for COPD.  She is followed by cardiology for coronary artery disease.   PMH: COPD, left TKA, Rt THA, left THA, hand surgery, Bipolar, depression, avascular necrosis,  PAIN:  NPRS scale: 3/10 overall today, 10/10 at worse in back, 8/10 worse pain in Rt knee Pain description: achy Aggravating factors: certain days are worse than other Relieving factors: over the counter meds, muscle relaxer, ice, nothing helps fully  PRECAUTIONS: Other: COPD  WEIGHT BEARING RESTRICTIONS: No  FALLS:  Has patient fallen in last 6 months? No  LIVING ENVIRONMENT: Lives with: lives with their family and lives alone Lives in: House/apartment Stairs: Yes: External: 4 steps; can reach both Has following equipment at home: Single point cane, shower chair, raised toilet seat  OCCUPATION: retired  PLOF: Independent  PATIENT GOALS: walk better and longer distances    OBJECTIVE:   DIAGNOSTIC FINDINGS:  10/28/2022:  1. Increased T2 signal in the T12-L1 disc, with some loss of cortical signal in the T12-L1 endplates. While this could be degenerative in nature, early discitis/osteomyelitis could have a similar appearance. Recommend correlation with inflammatory markers. 2. L3-L4 mild-to-moderate spinal canal stenosis and mild left neural foraminal narrowing, which is new from the prior exam. Narrowing of the lateral recesses at this level could affect the descending L4 nerve roots. 3. L4-L5 narrowing of the left lateral recess, which could affect the descending left L5 nerve. 4. L5-S1 large left lateral osteophyte, which may abut the exiting left L5 nerve. 5. Atrophy of the right psoas muscle, which is new from the prior exam.  PATIENT SURVEYS:  Patient-Specific Activity Scoring Scheme  0 represents "unable to perform." 10 represents "able to perform at prior level. 0 1  2 3 4 5 6 7 8 9 10  (Date and Score)   Activity Eval  04/06/24    1. Walk without can 3     2. Shower without sitting  1    3. Shop without electric cart 3   4.minor cleaning tasks 3   5.standing >/= 10 minutes 3   Score 4.6    Total score = sum of the activity scores/number of activities Minimum detectable change (90%CI) for average score = 2 points Minimum detectable change (90%CI) for single activity score = 3 points  COGNITION: Overall cognitive status: WFL    SENSATION: WFL  POSTURE:  rounded shoulders, forward head, and decreased lumbar lordosis  PALPATION: 04/06/24: TTP Rt lumbar paraspinals and QL, lateral Rt hip and joint line of Rt knee.   LOWER EXTREMITY ROM:   ROM Right 04/06/24 Left eval  Hip flexion 90 c pain In low back and Rt hip 98 c pain in low back  Hip extension    Hip abduction  Hip adduction    Hip internal rotation    Hip external rotation    Knee flexion 125 130  Knee extension -5 -2  Ankle dorsiflexion    Ankle plantarflexion    Ankle inversion    Ankle eversion     (Blank rows = not tested)  LOWER EXTREMITY MMT:  MMT Right Eval 04/06/24 Left eval  Hip flexion 15.2 lbs 18.9 lbs  Hip extension    Hip abduction    Hip adduction    Hip internal rotation    Hip external rotation    Knee flexion 24.9 lbs 21.0 lbs  Knee extension 33.2 lbs 43.5 lbs  Ankle dorsiflexion    Ankle plantarflexion    Ankle inversion    Ankle eversion     (Blank rows = not tested)    FUNCTIONAL TESTS:  04/06/24: 5 time sit to stand: 25.71 seconds c UE support  04/29/24: BERG 50/56   [facility] Adult PT Treatment/Exercise - 04/29/24 1441       Standardized Balance Assessment   Standardized Balance Assessment Berg Balance Test      Berg Balance Test   Sit to Stand Able to stand without using hands and stabilize independently    Standing Unsupported Able to stand safely 2 minutes    Sitting with Back Unsupported but Feet Supported on Floor or Stool  Able to sit safely and securely 2 minutes    Stand to Sit Sits safely with minimal use of hands    Transfers Able to transfer safely, minor use of hands    Standing Unsupported with Eyes Closed Able to stand 10 seconds safely    Standing Ubsupported with Feet Together Able to place feet together independently and stand 1 minute safely    From Standing, Reach Forward with Outstretched Arm Can reach confidently >25 cm (10)    From Standing Position, Pick up Object from Floor Able to pick up shoe safely and easily    From Standing Position, Turn to Look Behind Over each Shoulder Looks behind from both sides and weight shifts well    Turn 360 Degrees Able to turn 360 degrees safely in 4 seconds or less    Standing Unsupported, Alternately Place Feet on Step/Stool Able to complete 4 steps without aid or supervision    Standing Unsupported, One Foot in Front Able to plae foot ahead of the other independently and hold 30 seconds    Standing on One Leg Tries to lift leg/unable to hold 3 seconds but remains standing independently    Total Score 50          GAIT: Distance walked: clinic distances  Assistive device utilized: Single point cane Level of assistance: Modified independence Comments: antalgic gait with mild trendelenburg  TODAY'S TREATMENT                                                                          DATE:  04/29/24 Physical Performace Test BERG balance test - see above for details  TherEx Single knee to chest 3x30 sec bil  Supine piriformis stretch 3x30 sec bil Hooklying single limb clamshell 2x10 bil; L3 band Supine SAQ 2x10 bil; 3#   TherAct Sit to/from stand x 10 reps  04/26/24 TherEx Recumbent bike L4 x 3 min - had to stop due to pain Seated SLR 2x10 bil; 3 sec hold Seated LAQ 2x10; 3#  bil Supine SAQ 2x10; 3# bil Supine bridges x 3; 5 sec hold -had to stop due to pain  TherAct Seated chest press with 6# ball 2x10 Sit to/from stand from elevated surface without UE support 2x10    04/06/24 Therex: HEP instruction/performance c cues for techniques, handout provided.  Trial set performed of each for comprehension and symptom assessment.  See below for exercise list  PATIENT EDUCATION:  Education details: HEP, POC Person educated: Patient Education method: Explanation, Demonstration, Verbal cues, and Handouts Education comprehension: verbalized understanding, returned demonstration, and verbal cues required  HOME EXERCISE PROGRAM: Access Code: [email or web address] URL: [email or web address] Date: 04/06/2024 Prepared by: Delon Lunger  Exercises - Seated Long Arc Quad  - 1-2 x daily - 7 x weekly - 10 reps - Seated Small Alternating Straight Leg Lifts with Heel Touch  - 1-2 x daily - 7 x weekly - 10 reps - Supine Knee Extension Strengthening  - 1-2 x daily - 7 x weekly - 10 reps - 3 seconds hold  ASSESSMENT:  CLINICAL IMPRESSION: Pt tolerated session well today, BERG score 50/56 indicating moderate fall risk.  Will continue to benefit from PT to maximize function.   OBJECTIVE IMPAIRMENTS: decreased balance, decreased mobility, difficulty walking, decreased ROM, decreased strength, impaired flexibility, and pain.   ACTIVITY LIMITATIONS: lifting, bending, sitting, standing, squatting, sleeping, and stairs  PARTICIPATION LIMITATIONS: meal prep, cleaning, laundry, and community activity  PERSONAL FACTORS: 3+ comorbidities: see PMH above are also affecting patient's functional outcome.   REHAB POTENTIAL: Fair  to good, based on pt's significant co-morbidities and PMH see above  CLINICAL DECISION MAKING: Stable/uncomplicated  EVALUATION COMPLEXITY: Moderate   GOALS: Goals reviewed with patient? Yes  SHORT TERM GOALS: (target date for Short term goals are  3 weeks 04/27/2024)   1.  Patient will demonstrate independent use of home exercise program to maintain progress from in clinic treatments. Goal status: New  LONG TERM GOALS: (target dates for all long term goals are 10 weeks  06/18/2024 )   1. Patient will demonstrate/report pain at worst less than or equal to 2/10 to facilitate minimal limitation in daily activity secondary to pain symptoms.  Goal status: New   2. Patient will demonstrate independent use of home exercise program to facilitate ability to maintain/progress functional gains from skilled physical therapy services.  Goal status: New   3. Patient will demonstrate Patient specific functional scale avg > or = 4.6 to indicate reduced disability due to condition.   Goal status: New   4.  Patient will demonstrate Rt  LE MMT by >/= 5 lbs from  baseline throughout to faciltiate usual transfers, stairs, squatting at [facility] for daily life.   Goal status: New   5.  Patient will demonstrate up and down 4 steps with step to gait pattern using hand rail for support.  Goal status: New       PLAN:  PT FREQUENCY: 1-2x/week  PT DURATION: 10 weeks  PLANNED INTERVENTIONS: Can include 02853- PT Re-evaluation, 97110-Therapeutic exercises, 97530- Therapeutic activity, [id number]- Neuromuscular re-education, 97535- Self Care, 97140- Manual therapy, [phone]- Gait training, [id number]- Orthotic Fit/training, [phone]- Canalith repositioning, [id number]- Aquatic Therapy, [phone]- Electrical stimulation (unattended), [id number] Physical performance testing, 97016- Vasopneumatic device, [id number]- Ultrasound, [id number]- Traction (mechanical), [id number]- Ionotophoresis 4mg /ml Dexamethasone ,  [id number] - Needle insertion w/o injection 1 or 2 muscles, 20561 - Needle insertion w/o injection 3 or more muscles.   Patient/Family education, Balance training, Stair training, Taping, Dry Needling, Joint mobilization, Joint manipulation, Spinal manipulation, Spinal mobilization, Scar mobilization,  Vestibular training, Visual/preceptual remediation/compensation, DME instructions, Cryotherapy, and Moist heat.  All performed as medically necessary.  All included unless contraindicated  PLAN FOR NEXT SESSION: cont Quad strengthening, functional activities Measure lumbar ROM    Next MD visit: 03/10/25  [name], PT, DPT 04/29/24 3:13 PM

## 2024-05-03 ENCOUNTER — Ambulatory Visit (INDEPENDENT_AMBULATORY_CARE_PROVIDER_SITE_OTHER): Admitting: Family Medicine

## 2024-05-03 ENCOUNTER — Encounter: Payer: Self-pay | Admitting: Family Medicine

## 2024-05-03 ENCOUNTER — Encounter: Admitting: Physical Therapy

## 2024-05-03 VITALS — BP 102/60 | HR 79 | Temp 98.4°F | Ht 70.0 in | Wt 217.2 lb

## 2024-05-03 DIAGNOSIS — R252 Cramp and spasm: Secondary | ICD-10-CM

## 2024-05-03 LAB — BASIC METABOLIC PANEL WITH GFR
BUN: 18 mg/dL (ref 6–23)
CO2: 24 meq/L (ref 19–32)
Calcium: 9.7 mg/dL (ref 8.4–10.5)
Chloride: 106 meq/L (ref 96–112)
Creatinine, Ser: 1.35 mg/dL — ABNORMAL HIGH (ref 0.40–1.20)
GFR: 39.33 mL/min — ABNORMAL LOW (ref >60.00)
Glucose, Bld: 99 mg/dL (ref 70–99)
Potassium: 4 meq/L (ref 3.5–5.1)
Sodium: 140 meq/L (ref 135–145)

## 2024-05-03 LAB — CK: Total CK: 45 U/L (ref 17–177)

## 2024-05-03 MED ORDER — CYCLOBENZAPRINE HCL 10 MG PO TABS: 5.0000 mg | ORAL_TABLET | Freq: Three times a day (TID) | ORAL | 0 refills | Status: DC | PRN

## 2024-05-03 NOTE — Patient Instructions (Addendum)
 STOP atorvastatin  for 2 weeks, if the muscle cramps disappear then that was the likely cause of the cramping -- call me and let me know and I can change you to a different cholesterol medication.

## 2024-05-03 NOTE — Progress Notes (Unsigned)
 Established Patient Office Visit  Subjective   Patient ID: Judy [name], female    DOB: 09-11-1951  Age: 73 y.o. MRN: [id number]  Chief Complaint  Patient presents with  . Pain    Patient complains of back pain for some time, states she has had a pain block  and right calf pain x2 weeks    Patient is here to discuss her muscle cramps. She reports that the tizanidine  was not effective for the cramps, states that the muscle cramps are in the thighs and also her calf muscles. I reviewed her chart and it looks like she was just recently placed back on the atorvastatin  80 mg daily by her cardiologist. Then pt started having muscle cramps about 3 weeks ago.   Patient is reporting that the left side of her body will swell up, states this has been going on for a couple of months, states that it comes and goes, states that it is both her arm and her leg.   Pt is also reporting that her teeth are moving states that her front teeth are implants and they feel wiggly states that she did go to see the dentist, is seeing one in the morning.        Current Outpatient Medications  Medication Instructions  . ALBUTEROL  IN As needed  . aspirin  EC 81 mg, Oral, Daily, Swallow whole.  . atorvastatin  (LIPITOR) 80 mg, Oral, Daily  . cyclobenzaprine  (FLEXERIL ) 5-10 mg, Oral, 3 times daily PRN  . FLUoxetine (PROZAC) 20 mg, Daily  . Fluticasone -Umeclidin-Vilant (TRELEGY ELLIPTA ) 100-62.5-25 MCG/ACT AEPB INHALE 1 PUFF ONCE DAILY  . QUEtiapine  (SEROQUEL ) 600 mg, Daily at bedtime  . tirzepatide  (MOUNJARO ) 5 mg, Subcutaneous, Weekly       Review of Systems  All other systems reviewed and are negative.     Objective:     BP 102/60   Pulse 79   Temp 98.4 F (36.9 C) (Oral)   Ht 5' 10 (1.778 m)   Wt 217 lb 3.2 oz (98.5 kg)   SpO2 100%   BMI 31.16 kg/m     Physical Exam Vitals reviewed.  Constitutional:      Appearance: Normal appearance. She is well-groomed and normal weight.   Eyes:     Conjunctiva/sclera: Conjunctivae normal.  Neck:     Thyroid : No thyromegaly.  Cardiovascular:     Rate and Rhythm: Normal rate and regular rhythm.     Pulses: Normal pulses.     Heart sounds: S1 normal and S2 normal.  Pulmonary:     Effort: Pulmonary effort is normal.     Breath sounds: Normal breath sounds and air entry.  Abdominal:     General: Bowel sounds are normal.  Musculoskeletal:     Right lower leg: No edema.     Left lower leg: No edema.  Neurological:     Mental Status: She is alert and oriented to person, place, and time. Mental status is at baseline.     Gait: Gait is intact.  Psychiatric:        Mood and Affect: Mood and affect normal.        Speech: Speech normal.        Behavior: Behavior normal.        Judgment: Judgment normal.      No results found for any visits on 05/03/24.     The ASCVD Risk score (Arnett DK, et al., 2019) failed to calculate for the following reasons:  The valid total cholesterol range is 130 to 320 mg/dL    Assessment & Plan:  Muscle cramps -     Cyclobenzaprine  HCl; Take 0.5-1 tablets (5-10 mg total) by mouth 3 (three) times daily as needed for muscle spasms.  Dispense: 30 tablet; Refill: 0 -     CK; Future -     Basic metabolic panel with GFR; Future     No follow-ups on file.    Heron CHRISTELLA Sharper, MD

## 2024-05-05 ENCOUNTER — Ambulatory Visit (INDEPENDENT_AMBULATORY_CARE_PROVIDER_SITE_OTHER): Admitting: Physical Therapy

## 2024-05-05 ENCOUNTER — Encounter: Payer: Self-pay | Admitting: Physical Therapy

## 2024-05-05 DIAGNOSIS — M25561 Pain in right knee: Secondary | ICD-10-CM

## 2024-05-05 DIAGNOSIS — M6281 Muscle weakness (generalized): Secondary | ICD-10-CM | POA: Diagnosis not present

## 2024-05-05 DIAGNOSIS — R293 Abnormal posture: Secondary | ICD-10-CM

## 2024-05-05 DIAGNOSIS — R262 Difficulty in walking, not elsewhere classified: Secondary | ICD-10-CM | POA: Diagnosis not present

## 2024-05-05 NOTE — Therapy (Signed)
 OUTPATIENT PHYSICAL THERAPY TREATMENT   Patient Name: Judy [name] MRN: [id number] DOB:[birth year], 73 y.o., female Today's Date: 05/05/2024  END OF SESSION:  PT End of Session - 05/05/24 1303     Visit Number 4    Number of Visits 20    Date for PT Re-Evaluation 06/18/24    Authorization Type Medicare/[employer]    Progress Note Due on Visit 10    PT Start Time 1301    PT Stop Time 1351    PT Time Calculation (min) 50 min    Activity Tolerance Patient limited by pain             Past Medical History:  Diagnosis Date   Anxiety    Arthritis    Avascular necrosis (HCC)    Left hip   Avascular necrosis of bone of hip, 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   Bipolar 1 disorder (HCC)    COPD (chronic obstructive pulmonary disease) (HCC)    Depression    Dyspnea    Pneumonia    PTSD (post-traumatic stress disorder)    Past Surgical History:  Procedure Laterality Date   bone spurs foot Right    CATARACT EXTRACTION Bilateral    COLONOSCOPY     COLONOSCOPY  07/2022   HAND SURGERY Left    JOINT REPLACEMENT     TONSILLECTOMY     TONSILLECTOMY AND ADENOIDECTOMY     TOTAL HIP ARTHROPLASTY Left 03/19/2019   Procedure: LEFT TOTAL HIP ARTHROPLASTY ANTERIOR APPROACH;  Surgeon: Vernetta Lonni GRADE, MD;  Location: [facility];  Service: Orthopedics;  Laterality: Left;   TOTAL HIP ARTHROPLASTY Right 06/04/2019   Procedure: RIGHT TOTAL HIP ARTHROPLASTY ANTERIOR APPROACH;  Surgeon: Vernetta Lonni GRADE, MD;  Location: [facility];  Service: Orthopedics;  Laterality: Right;   TOTAL KNEE ARTHROPLASTY Left 12/28/2021   Procedure: Left TOTAL KNEE ARTHROPLASTY;  Surgeon: Vernetta Lonni GRADE, MD;  Location: [facility];  Service: Orthopedics;  Laterality: Left;   Patient Active Problem List   Diagnosis Date Noted   Coronary artery disease involving native coronary artery of native heart without angina pectoris 03/08/2024   Aortic  atherosclerosis (HCC) 03/08/2024   Unilateral primary osteoarthritis, right knee 12/31/2023   Hyperlipidemia 07/04/2023   Diabetes mellitus without complication (HCC) 08/09/2022   Restless legs syndrome 08/09/2022   Status post total left knee replacement 12/28/2021   Primary osteoarthritis of left knee 10/25/2021   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) 11/30/2020   Status post total replacement of right hip 06/04/2019   Status post total replacement of left hip 03/19/2019   PTSD (post-traumatic stress disorder)    COPD (chronic obstructive pulmonary disease) (HCC)    Bipolar 1 disorder (HCC)    Anxiety    B12 deficiency 12/09/2018   Hyperglycemia 04/14/2017    PCP: Ozell Heron HERO, MD   REFERRING PROVIDER: Dolphus Reiter, MD   REFERRING DIAG:  M17.11 (ICD-10-CM) - Primary osteoarthritis of right knee  R26.81 (ICD-10-CM) - Unstable gait    THERAPY DIAG:  Acute pain of right knee  Muscle weakness (generalized)  Difficulty in walking, not elsewhere classified  Abnormal posture  Rationale for Evaluation and Treatment: Rehabilitation  ONSET DATE: Months ago  SUBJECTIVE:   SUBJECTIVE STATEMENT:   PERTINENT HISTORY: History of Present Illness: Judy [name] is a 73 y.o. female with ankylosing spondylitis, osteoarthritis and degenerative disc disease.  She had been receiving right knee joint viscosupplement  injections at the [facility] pain center.  She has noticed improvement in her right knee joint pain.  Her left knee joint had replacement in 2023 which continues to hurt.  She states she has been noticing increased discomfort and swelling in her left knee and left foot.  She believes her left arm is swollen.  She had taken Cimzia  in the past and did not notice any difference on Cimzia .  She continues to have stiffness in her neck and back.  She states she was seen at the pain clinic and had facet joint injection in her lumbar spine 3 weeks ago which  was helpful.  She is followed by Dr. Theophilus for COPD.  She is followed by cardiology for coronary artery disease.   PMH: COPD, left TKA, Rt THA, left THA, hand surgery, Bipolar, depression, avascular necrosis,  PAIN:  NPRS scale: 2/10 overall today, 10/10 at worse in back, 8/10 worse pain in Rt knee Pain description: achy Aggravating factors: certain days are worse than other Relieving factors: over the counter meds, muscle relaxer, ice, nothing helps fully  PRECAUTIONS: Other: COPD  WEIGHT BEARING RESTRICTIONS: No  FALLS:  Has patient fallen in last 6 months? No  LIVING ENVIRONMENT: Lives with: lives with their family and lives alone Lives in: House/apartment Stairs: Yes: External: 4 steps; can reach both Has following equipment at home: Single point cane, shower chair, raised toilet seat  OCCUPATION: retired  PLOF: Independent  PATIENT GOALS: walk better and longer distances    OBJECTIVE:   DIAGNOSTIC FINDINGS:  10/28/2022:  1. Increased T2 signal in the T12-L1 disc, with some loss of cortical signal in the T12-L1 endplates. While this could be degenerative in nature, early discitis/osteomyelitis could have a similar appearance. Recommend correlation with inflammatory markers. 2. L3-L4 mild-to-moderate spinal canal stenosis and mild left neural foraminal narrowing, which is new from the prior exam. Narrowing of the lateral recesses at this level could affect the descending L4 nerve roots. 3. L4-L5 narrowing of the left lateral recess, which could affect the descending left L5 nerve. 4. L5-S1 large left lateral osteophyte, which may abut the exiting left L5 nerve. 5. Atrophy of the right psoas muscle, which is new from the prior exam.  PATIENT SURVEYS:  Patient-Specific Activity Scoring Scheme  0 represents "unable to perform." 10 represents "able to perform at prior level. 0 1 2 3 4 5 6 7 8 9  10 (Date and Score)   Activity Eval  04/06/24    1. Walk without  can 3     2. Shower without sitting  1    3. Shop without electric cart 3   4.minor cleaning tasks 3   5.standing >/= 10 minutes 3   Score 4.6    Total score = sum of the activity scores/number of activities Minimum detectable change (90%CI) for average score = 2 points Minimum detectable change (90%CI) for single activity score = 3 points  COGNITION: Overall cognitive status: WFL    SENSATION: WFL  POSTURE:  rounded shoulders, forward head, and decreased lumbar lordosis  PALPATION: 04/06/24: TTP Rt lumbar paraspinals and QL, lateral Rt hip and joint line of Rt knee.   LOWER EXTREMITY ROM:   ROM Right 04/06/24 Left eval  Hip flexion 90 c pain In low back and Rt hip 98 c pain in low back  Hip extension    Hip abduction    Hip adduction    Hip internal rotation    Hip external rotation  Knee flexion 125 130  Knee extension -5 -2  Ankle dorsiflexion    Ankle plantarflexion    Ankle inversion    Ankle eversion     (Blank rows = not tested)  LOWER EXTREMITY MMT:  MMT Right Eval 04/06/24 Left eval  Hip flexion 15.2 lbs 18.9 lbs  Hip extension    Hip abduction    Hip adduction    Hip internal rotation    Hip external rotation    Knee flexion 24.9 lbs 21.0 lbs  Knee extension 33.2 lbs 43.5 lbs  Ankle dorsiflexion    Ankle plantarflexion    Ankle inversion    Ankle eversion     (Blank rows = not tested)    FUNCTIONAL TESTS:  04/06/24: 5 time sit to stand: 25.71 seconds c UE support  04/29/24: BERG 50/56     GAIT: Distance walked: clinic distances  Assistive device utilized: Single point cane Level of assistance: Modified independence Comments: antalgic gait with mild trendelenburg                                                                                                                                                                        TODAY'S TREATMENT                                                                          DATE:   05/05/24 TherEx NuStep L4 x 8 min Supine piriformis stretch 3x30 sec bil Hooklying single limb clamshell 2x10 bil; L3 band  TherAct Bridges 2x10 with L3 band isometric hold Sit to/from stand 2x5 reps  Moist heat x 10 min to neck and low back after session   04/29/24 Physical Performace Test BERG balance test - see above for details  TherEx Single knee to chest 3x30 sec bil  Supine piriformis stretch 3x30 sec bil Hooklying single limb clamshell 2x10 bil; L3 band Supine SAQ 2x10 bil; 3#   TherAct Sit to/from stand x 10 reps  04/26/24 TherEx Recumbent bike L4 x 3 min - had to stop due to pain Seated SLR 2x10 bil; 3 sec hold Seated LAQ 2x10; 3# bil Supine SAQ 2x10; 3# bil Supine bridges x 3; 5 sec hold -had to stop due to pain  TherAct Seated chest press with 6# ball 2x10 Sit to/from stand from elevated surface without UE support 2x10    04/06/24 Therex: HEP instruction/performance c cues for techniques, handout provided.  Trial set performed of each for  comprehension and symptom assessment.  See below for exercise list  PATIENT EDUCATION:  Education details: HEP, POC Person educated: Patient Education method: Explanation, Demonstration, Verbal cues, and Handouts Education comprehension: verbalized understanding, returned demonstration, and verbal cues required  HOME EXERCISE PROGRAM: Access Code: [email or web address] URL: [email or web address] Date: 04/06/2024 Prepared by: Delon Lunger  Exercises - Seated Long Arc Quad  - 1-2 x daily - 7 x weekly - 10 reps - Seated Small Alternating Straight Leg Lifts with Heel Touch  - 1-2 x daily - 7 x weekly - 10 reps - Supine Knee Extension Strengthening  - 1-2 x daily - 7 x weekly - 10 reps - 3 seconds hold  ASSESSMENT:  CLINICAL IMPRESSION: Pt tolerated session well today, overall doing well with PT.  Will continue to benefit from PT to maximize function.     OBJECTIVE IMPAIRMENTS: decreased balance, decreased  mobility, difficulty walking, decreased ROM, decreased strength, impaired flexibility, and pain.   ACTIVITY LIMITATIONS: lifting, bending, sitting, standing, squatting, sleeping, and stairs  PARTICIPATION LIMITATIONS: meal prep, cleaning, laundry, and community activity  PERSONAL FACTORS: 3+ comorbidities: see PMH above are also affecting patient's functional outcome.   REHAB POTENTIAL: Fair  to good, based on pt's significant co-morbidities and PMH see above  CLINICAL DECISION MAKING: Stable/uncomplicated  EVALUATION COMPLEXITY: Moderate   GOALS: Goals reviewed with patient? Yes  SHORT TERM GOALS: (target date for Short term goals are 3 weeks 04/27/2024)   1.  Patient will demonstrate independent use of home exercise program to maintain progress from in clinic treatments. Goal status: MET 04/29/24  LONG TERM GOALS: (target dates for all long term goals are 10 weeks  06/18/2024 )   1. Patient will demonstrate/report pain at worst less than or equal to 2/10 to facilitate minimal limitation in daily activity secondary to pain symptoms.  Goal status: New   2. Patient will demonstrate independent use of home exercise program to facilitate ability to maintain/progress functional gains from skilled physical therapy services.  Goal status: New   3. Patient will demonstrate Patient specific functional scale avg > or = 4.6 to indicate reduced disability due to condition.   Goal status: New   4.  Patient will demonstrate Rt  LE MMT by >/= 5 lbs from baseline throughout to faciltiate usual transfers, stairs, squatting at [facility] for daily life.   Goal status: New   5.  Patient will demonstrate up and down 4 steps with step to gait pattern using hand rail for support.  Goal status: New       PLAN:  PT FREQUENCY: 1-2x/week  PT DURATION: 10 weeks  PLANNED INTERVENTIONS: Can include 02853- PT Re-evaluation, 97110-Therapeutic exercises, 97530- Therapeutic activity, [id number]- Neuromuscular  re-education, 97535- Self Care, 97140- Manual therapy, [phone]- Gait training, [phone]- Orthotic Fit/training, [phone]- Canalith repositioning, [id number]- Aquatic Therapy, [phone]- Electrical stimulation (unattended), [id number] Physical performance testing, 97016- Vasopneumatic device, [id number]- Ultrasound, [id number]- Traction (mechanical), [id number]- Ionotophoresis 4mg /ml Dexamethasone ,  [id number] - Needle insertion w/o injection 1 or 2 muscles, 20561 - Needle insertion w/o injection 3 or more muscles.   Patient/Family education, Balance training, Stair training, Taping, Dry Needling, Joint mobilization, Joint manipulation, Spinal manipulation, Spinal mobilization, Scar mobilization, Vestibular training, Visual/preceptual remediation/compensation, DME instructions, Cryotherapy, and Moist heat.  All performed as medically necessary.  All included unless contraindicated  PLAN FOR NEXT SESSION:  cont Quad strengthening, functional activities    Next MD visit: 03/10/25  [name], PT, DPT 05/05/24 1:43 PM

## 2024-05-07 ENCOUNTER — Ambulatory Visit: Payer: Self-pay | Admitting: Family Medicine

## 2024-05-07 ENCOUNTER — Telehealth: Payer: Self-pay | Admitting: *Deleted

## 2024-05-07 NOTE — Telephone Encounter (Signed)
 Noted

## 2024-05-07 NOTE — Telephone Encounter (Signed)
 Copied from CRM [id number]. Topic: Clinical - Lab/Test Results >> May 07, 2024  2:17 PM Larissa S wrote: Reason for CRM: Patient returning missed call. Relayed lab results per provider's note, verbatim. Patient verbalized understanding, no additional questions at this time.

## 2024-05-10 ENCOUNTER — Ambulatory Visit (INDEPENDENT_AMBULATORY_CARE_PROVIDER_SITE_OTHER): Admitting: Physical Therapy

## 2024-05-10 ENCOUNTER — Encounter: Payer: Self-pay | Admitting: Physical Therapy

## 2024-05-10 DIAGNOSIS — M25561 Pain in right knee: Secondary | ICD-10-CM

## 2024-05-10 DIAGNOSIS — R262 Difficulty in walking, not elsewhere classified: Secondary | ICD-10-CM | POA: Diagnosis not present

## 2024-05-10 DIAGNOSIS — R293 Abnormal posture: Secondary | ICD-10-CM | POA: Diagnosis not present

## 2024-05-10 DIAGNOSIS — M6281 Muscle weakness (generalized): Secondary | ICD-10-CM

## 2024-05-10 NOTE — Therapy (Signed)
 OUTPATIENT PHYSICAL THERAPY TREATMENT   Patient Name: Judy [name] MRN: [id number] DOB:[birth year], 73 y.o., female Today's Date: 05/10/2024  END OF SESSION:  PT End of Session - 05/10/24 1223     Visit Number 5    Number of Visits 20    Date for PT Re-Evaluation 06/18/24    Authorization Type Medicare/[employer]    Progress Note Due on Visit 10    PT Start Time 1145    PT Stop Time 1228    PT Time Calculation (min) 43 min    Activity Tolerance Patient limited by pain    Behavior During Therapy [facility] for tasks assessed/performed              Past Medical History:  Diagnosis Date   Anxiety    Arthritis    Avascular necrosis (HCC)    Left hip   Avascular necrosis of bone of hip, 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   Bipolar 1 disorder (HCC)    COPD (chronic obstructive pulmonary disease) (HCC)    Depression    Dyspnea    Pneumonia    PTSD (post-traumatic stress disorder)    Past Surgical History:  Procedure Laterality Date   bone spurs foot Right    CATARACT EXTRACTION Bilateral    COLONOSCOPY     COLONOSCOPY  07/2022   HAND SURGERY Left    JOINT REPLACEMENT     TONSILLECTOMY     TONSILLECTOMY AND ADENOIDECTOMY     TOTAL HIP ARTHROPLASTY Left 03/19/2019   Procedure: LEFT TOTAL HIP ARTHROPLASTY ANTERIOR APPROACH;  Surgeon: Vernetta Lonni GRADE, MD;  Location: [facility];  Service: Orthopedics;  Laterality: Left;   TOTAL HIP ARTHROPLASTY Right 06/04/2019   Procedure: RIGHT TOTAL HIP ARTHROPLASTY ANTERIOR APPROACH;  Surgeon: Vernetta Lonni GRADE, MD;  Location: [facility];  Service: Orthopedics;  Laterality: Right;   TOTAL KNEE ARTHROPLASTY Left 12/28/2021   Procedure: Left TOTAL KNEE ARTHROPLASTY;  Surgeon: Vernetta Lonni GRADE, MD;  Location: [facility];  Service: Orthopedics;  Laterality: Left;   Patient Active Problem List   Diagnosis Date Noted   Coronary artery disease involving native coronary artery of  native heart without angina pectoris 03/08/2024   Aortic atherosclerosis (HCC) 03/08/2024   Unilateral primary osteoarthritis, right knee 12/31/2023   Hyperlipidemia 07/04/2023   Diabetes mellitus without complication (HCC) 08/09/2022   Restless legs syndrome 08/09/2022   Status post total left knee replacement 12/28/2021   Primary osteoarthritis of left knee 10/25/2021   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) 11/30/2020   Status post total replacement of right hip 06/04/2019   Status post total replacement of left hip 03/19/2019   PTSD (post-traumatic stress disorder)    COPD (chronic obstructive pulmonary disease) (HCC)    Bipolar 1 disorder (HCC)    Anxiety    B12 deficiency 12/09/2018   Hyperglycemia 04/14/2017    PCP: Ozell Heron HERO, MD   REFERRING PROVIDER: Dolphus Reiter, MD   REFERRING DIAG:  M17.11 (ICD-10-CM) - Primary osteoarthritis of right knee  R26.81 (ICD-10-CM) - Unstable gait    THERAPY DIAG:  Acute pain of right knee  Muscle weakness (generalized)  Difficulty in walking, not elsewhere classified  Abnormal posture  Rationale for Evaluation and Treatment: Rehabilitation  ONSET DATE: Months ago  SUBJECTIVE:   SUBJECTIVE STATEMENT: Pt arriving today reporting back pain of 2/10. Pt reporting being super tired  due to not sleeping well. Pt stating when  she stood up last night she felt like she was going to fall forward on her face so she laid back down. Pt stating that sensation last for about 2 hours so she just continued to lye in bed.   PERTINENT HISTORY: History of Present Illness: Judy [name] is a 73 y.o. female with ankylosing spondylitis, osteoarthritis and degenerative disc disease.  She had been receiving right knee joint viscosupplement injections at the knee pain center.  She has noticed improvement in her right knee joint pain.  Her left knee joint had replacement in 2023 which continues to hurt.  She states  she has been noticing increased discomfort and swelling in her left knee and left foot.  She believes her left arm is swollen.  She had taken Cimzia  in the past and did not notice any difference on Cimzia .  She continues to have stiffness in her neck and back.  She states she was seen at the pain clinic and had facet joint injection in her lumbar spine 3 weeks ago which was helpful.  She is followed by Dr. Theophilus for COPD.  She is followed by cardiology for coronary artery disease.   PMH: COPD, left TKA, Rt THA, left THA, hand surgery, Bipolar, depression, avascular necrosis,   PAIN:  NPRS scale: see above Pain description: achy Aggravating factors: certain days are worse than other Relieving factors: over the counter meds, muscle relaxer, ice, nothing helps fully  PRECAUTIONS: Other: COPD  WEIGHT BEARING RESTRICTIONS: No  FALLS:  Has patient fallen in last 6 months? No  LIVING ENVIRONMENT: Lives with: lives with their family and lives alone Lives in: House/apartment Stairs: Yes: External: 4 steps; can reach both Has following equipment at home: Single point cane, shower chair, raised toilet seat  OCCUPATION: retired  PLOF: Independent  PATIENT GOALS: walk better and longer distances    OBJECTIVE:   DIAGNOSTIC FINDINGS:  10/28/2022:  1. Increased T2 signal in the T12-L1 disc, with some loss of cortical signal in the T12-L1 endplates. While this could be degenerative in nature, early discitis/osteomyelitis could have a similar appearance. Recommend correlation with inflammatory markers. 2. L3-L4 mild-to-moderate spinal canal stenosis and mild left neural foraminal narrowing, which is new from the prior exam. Narrowing of the lateral recesses at this level could affect the descending L4 nerve roots. 3. L4-L5 narrowing of the left lateral recess, which could affect the descending left L5 nerve. 4. L5-S1 large left lateral osteophyte, which may abut the exiting left L5  nerve. 5. Atrophy of the right psoas muscle, which is new from the prior exam.  PATIENT SURVEYS:  Patient-Specific Activity Scoring Scheme  0 represents "unable to perform." 10 represents "able to perform at prior level. 0 1 2 3 4 5 6 7 8 9  10 (Date and Score)   Activity Eval  04/06/24 05/10/24   1. Walk without cane 3   8  2. Shower without sitting  1  1  3. Shop without electric cart 3 3  4.minor cleaning tasks 3 9  5.standing >/= 10 minutes 3 3  Score 4.6 4.8   Total score = sum of the activity scores/number of activities Minimum detectable change (90%CI) for average score = 2 points Minimum detectable change (90%CI) for single activity score = 3 points  COGNITION: Overall cognitive status: WFL    SENSATION: WFL  POSTURE:  rounded shoulders, forward head, and decreased lumbar lordosis  PALPATION: 04/06/24: TTP Rt lumbar paraspinals and QL, lateral Rt hip and joint  line of Rt knee.   LOWER EXTREMITY ROM:   ROM Right 04/06/24 Left eval  Hip flexion 90 c pain In low back and Rt hip 98 c pain in low back  Hip extension    Hip abduction    Hip adduction    Hip internal rotation    Hip external rotation    Knee flexion 125 130  Knee extension -5 -2  Ankle dorsiflexion    Ankle plantarflexion    Ankle inversion    Ankle eversion     (Blank rows = not tested)  LOWER EXTREMITY MMT:  MMT Right Eval 04/06/24 Left eval  Hip flexion 15.2 lbs 18.9 lbs  Hip extension    Hip abduction    Hip adduction    Hip internal rotation    Hip external rotation    Knee flexion 24.9 lbs 21.0 lbs  Knee extension 33.2 lbs 43.5 lbs  Ankle dorsiflexion    Ankle plantarflexion    Ankle inversion    Ankle eversion     (Blank rows = not tested)    FUNCTIONAL TESTS:  04/06/24: 5 time sit to stand: 25.71 seconds c UE support  04/29/24: BERG 50/56   GAIT: Distance walked: clinic distances  Assistive device utilized: Single point cane Level of assistance: Modified  independence Comments: antalgic gait with mild trendelenburg                                                                                                                                                                        TODAY'S TREATMENT                                                                          DATE: 05/10/24 TherEx NuStep L4 x  10 min Supine piriformis stretch 3x30 sec bil Hooklying single limb clamshell 2x10 bil; L3 band Supine isometric abdominal contraction pushing bil UE's into red physio ball x 10  TherAct Bridges 2 x 10 c pursed lip breathing to prevent pt from holding her breathe Sit to stand x 10  Log rolling bil sides  Modalities:  Moist heat x 5 min to low back and bil knees at end of session with bil LE elevated on bolster    TODAY'S TREATMENT  DATE:  05/05/24 TherEx NuStep L4 x 8 min Supine piriformis stretch 3x30 sec bil Hooklying single limb clamshell 2x10 bil; L3 band  TherAct Bridges 2x10 with L3 band isometric hold Sit to/from stand 2x5 reps  Moist heat x 10 min to neck and low back after session     04/29/24 Physical Performace Test BERG balance test - see above for details  TherEx Single knee to chest 3x30 sec bil  Supine piriformis stretch 3x30 sec bil Hooklying single limb clamshell 2x10 bil; L3 band Supine SAQ 2x10 bil; 3#   TherAct Sit to/from stand x 10 reps        PATIENT EDUCATION:  Education details: HEP, POC Person educated: Patient Education method: Programmer, multimedia, Facilities manager, Verbal cues, and Handouts Education comprehension: verbalized understanding, returned demonstration, and verbal cues required  HOME EXERCISE PROGRAM: Access Code: [email or web address] URL: [email or web address] Date: 04/06/2024 Prepared by: Delon Lunger  Exercises - Seated Long Arc Quad  - 1-2 x daily - 7 x weekly - 10 reps - Seated Small Alternating  Straight Leg Lifts with Heel Touch  - 1-2 x daily - 7 x weekly - 10 reps - Supine Knee Extension Strengthening  - 1-2 x daily - 7 x weekly - 10 reps - 3 seconds hold  ASSESSMENT:  CLINICAL IMPRESSION: Pt arriving today reporting more pain in her low back and bil knees. Pt tolerating exercises with instructions for technique and slow movements due to increased pain. Recommending continuation of skilled PT.    OBJECTIVE IMPAIRMENTS: decreased balance, decreased mobility, difficulty walking, decreased ROM, decreased strength, impaired flexibility, and pain.   ACTIVITY LIMITATIONS: lifting, bending, sitting, standing, squatting, sleeping, and stairs  PARTICIPATION LIMITATIONS: meal prep, cleaning, laundry, and community activity  PERSONAL FACTORS: 3+ comorbidities: see PMH above are also affecting patient's functional outcome.   REHAB POTENTIAL: Fair  to good, based on pt's significant co-morbidities and PMH see above  CLINICAL DECISION MAKING: Stable/uncomplicated  EVALUATION COMPLEXITY: Moderate   GOALS: Goals reviewed with patient? Yes  SHORT TERM GOALS: (target date for Short term goals are 3 weeks 04/27/2024)   1.  Patient will demonstrate independent use of home exercise program to maintain progress from in clinic treatments. Goal status: MET 04/29/24  LONG TERM GOALS: (target dates for all long term goals are 10 weeks  06/18/2024 )   1. Patient will demonstrate/report pain at worst less than or equal to 2/10 to facilitate minimal limitation in daily activity secondary to pain symptoms.  Goal status: New   2. Patient will demonstrate independent use of home exercise program to facilitate ability to maintain/progress functional gains from skilled physical therapy services.  Goal status: New   3. Patient will demonstrate Patient specific functional scale avg > or = 4.6 to indicate reduced disability due to condition.   Goal status: New   4.  Patient will demonstrate Rt  LE  MMT by >/= 5 lbs from baseline throughout to faciltiate usual transfers, stairs, squatting at [facility] for daily life.   Goal status: New   5.  Patient will demonstrate up and down 4 steps with step to gait pattern using hand rail for support.  Goal status: New       PLAN:  PT FREQUENCY: 1-2x/week  PT DURATION: 10 weeks  PLANNED INTERVENTIONS: Can include 02853- PT Re-evaluation, 97110-Therapeutic exercises, 97530- Therapeutic activity, [id number]- Neuromuscular re-education, 97535- Self Care, 97140- Manual therapy, [id number]- Gait training, [phone]- Orthotic Fit/training, [id number]- Canalith repositioning, [id number]- Aquatic Therapy, H9716- Electrical stimulation (unattended), [id number] Physical  performance testing, 02983- Vasopneumatic device, [id number]- Ultrasound, [id number]- Traction (mechanical), [id number]- Ionotophoresis 4mg /ml Dexamethasone ,  79439 - Needle insertion w/o injection 1 or 2 muscles, 20561 - Needle insertion w/o injection 3 or more muscles.   Patient/Family education, Balance training, Stair training, Taping, Dry Needling, Joint mobilization, Joint manipulation, Spinal manipulation, Spinal mobilization, Scar mobilization, Vestibular training, Visual/preceptual remediation/compensation, DME instructions, Cryotherapy, and Moist heat.  All performed as medically necessary.  All included unless contraindicated  PLAN FOR NEXT SESSION:  progress functional mobility, quad strengthening, core strengthening.   Next MD visit: 03/10/25  Delon Lunger, PT, MPT 05/10/24 12:32 PM  05/10/24 12:32 PM

## 2024-05-13 ENCOUNTER — Encounter: Admitting: Rehabilitative and Restorative Service Providers"

## 2024-05-17 ENCOUNTER — Encounter: Admitting: Physical Therapy

## 2024-05-18 ENCOUNTER — Ambulatory Visit (INDEPENDENT_AMBULATORY_CARE_PROVIDER_SITE_OTHER): Admitting: Family Medicine

## 2024-05-18 ENCOUNTER — Encounter: Payer: Self-pay | Admitting: Family Medicine

## 2024-05-18 ENCOUNTER — Ambulatory Visit: Payer: Self-pay

## 2024-05-18 ENCOUNTER — Ambulatory Visit: Attending: Family Medicine

## 2024-05-18 VITALS — BP 124/78 | Temp 98.4°F | Ht 70.0 in | Wt 214.0 lb

## 2024-05-18 DIAGNOSIS — R55 Syncope and collapse: Secondary | ICD-10-CM | POA: Diagnosis not present

## 2024-05-18 LAB — CBC WITH DIFFERENTIAL/PLATELET
Basophils Absolute: 0 K/uL (ref 0.0–0.1)
Basophils Relative: 0.4 % (ref 0.0–3.0)
Eosinophils Absolute: 0.1 K/uL (ref 0.0–0.7)
Eosinophils Relative: 1.6 % (ref 0.0–5.0)
HCT: 36.2 % (ref 36.0–46.0)
Hemoglobin: 11.8 g/dL — ABNORMAL LOW (ref 12.0–15.0)
Lymphocytes Relative: 18.6 % (ref 12.0–46.0)
Lymphs Abs: 1.7 K/uL (ref 0.7–4.0)
MCHC: 32.5 g/dL (ref 30.0–36.0)
MCV: 83.4 fl (ref 78.0–100.0)
Monocytes Absolute: 0.5 K/uL (ref 0.1–1.0)
Monocytes Relative: 5.9 % (ref 3.0–12.0)
Neutro Abs: 6.6 K/uL (ref 1.4–7.7)
Neutrophils Relative %: 73.5 % (ref 43.0–77.0)
Platelets: 287 K/uL (ref 150.0–400.0)
RBC: 4.35 Mil/uL (ref 3.87–5.11)
RDW: 17.1 % — ABNORMAL HIGH (ref 11.5–15.5)
WBC: 8.9 K/uL (ref 4.0–10.5)

## 2024-05-18 LAB — COMPREHENSIVE METABOLIC PANEL WITH GFR
ALT: 22 U/L (ref 0–35)
AST: 17 U/L (ref 0–37)
Albumin: 3.8 g/dL (ref 3.5–5.2)
Alkaline Phosphatase: 150 U/L — ABNORMAL HIGH (ref 39–117)
BUN: 20 mg/dL (ref 6–23)
CO2: 26 meq/L (ref 19–32)
Calcium: 9.4 mg/dL (ref 8.4–10.5)
Chloride: 106 meq/L (ref 96–112)
Creatinine, Ser: 1 mg/dL (ref 0.40–1.20)
GFR: 56.36 mL/min — ABNORMAL LOW
Glucose, Bld: 97 mg/dL (ref 70–99)
Potassium: 4.5 meq/L (ref 3.5–5.1)
Sodium: 139 meq/L (ref 135–145)
Total Bilirubin: 0.4 mg/dL (ref 0.2–1.2)
Total Protein: 6.8 g/dL (ref 6.0–8.3)

## 2024-05-18 NOTE — Progress Notes (Signed)
 Established Patient Office Visit   Subjective:  Patient ID: UVA RUNKEL, female    DOB: 1951/01/14  Age: 73 y.o. MRN: [id number]  Chief Complaint  Patient presents with   Loss of Consciousness    Loss of Consciousness   Patient is here for an acute visit since her primary care provider, Dr. Ozell, is not available.  Patient reports she had a syncopal episode last night and thinks she had loss of consciousness for about 5-10 minutes. She reports she had just stood up from a chair when she had the syncopal episode. She denies having any chest pain, SHOB, dizziness, lightheadedness, or palpitations prior to having the syncopal episode. She reports she does not think she hit her head on anything. Landed on kitchen floor and laid on the floor about 30 minutes. She reports when she regained consciousness she felt she didn't have the strength to get off the floor. She called 911, [facility] came to her home. She didn't go to the hospital for further evaluation.   Today, she feels fine besides having typically knee pain. Denies CP, SHOB, lightheadedness, dizziness, or palpitations today.   She reports about twice a month, for about 6-8 months, she will have an episode where she feels like she is moving forward, like someone is pushing her and hitting the back of her legs at her knees all at the same time. She denies any other symptoms during these episodes.  She reports she has mentioned this to her various providers and feels no one is concerned.   Currently in physical therapy at [facility] for knee pain, under the care of [facility] for knee pain, and is under the care of chiropractor, Dr. Derenda in [facility] and started this yesterday.   She reports she took Flexeril , Seroquel , and Atorvastatin  about an hour before episode. She has noticed these episodes are occurring after taking medication. She reports her Seroquel  dose has increased back to  800 mg daily about a month ago. She reports she has been experiencing insomnia for the 2-3 weeks. She reports having good sleep on Friday and Saturday.  Review of Systems  Cardiovascular:  Positive for syncope.   See HPI above     Objective:   BP 124/78   Temp 98.4 F (36.9 C) (Oral)   Ht 5' 10 (1.778 m)   Wt 214 lb (97.1 kg)   SpO2 97%   BMI 30.71 kg/m    Physical Exam Vitals reviewed.  Constitutional:      General: She is not in acute distress.    Appearance: Normal appearance. She is obese. She is not ill-appearing, toxic-appearing or diaphoretic.  HENT:     Head: Normocephalic and atraumatic.  Eyes:     General:        Right eye: No discharge.        Left eye: No discharge.     Extraocular Movements:     Right eye: Normal extraocular motion and no nystagmus.     Left eye: Normal extraocular motion and no nystagmus.     Conjunctiva/sclera: Conjunctivae normal.  Cardiovascular:     Rate and Rhythm: Normal rate and regular rhythm.     Heart sounds: Normal heart sounds. No murmur heard.    No friction rub. No gallop.  Pulmonary:     Effort: Pulmonary effort is normal. No respiratory distress.     Breath sounds: Normal breath sounds.  Musculoskeletal:  General: Normal range of motion.  Skin:    General: Skin is warm and dry.  Neurological:     General: No focal deficit present.     Mental Status: She is alert and oriented to person, place, and time. Mental status is at baseline.     GCS: GCS eye subscore is 4. GCS verbal subscore is 5. GCS motor subscore is 6.     Cranial Nerves: Cranial nerves 2-12 are intact.     Motor: Weakness (Knees) present.  Psychiatric:        Mood and Affect: Mood normal.        Behavior: Behavior normal.        Thought Content: Thought content normal.        Judgment: Judgment normal.   -EKG completed for syncopal episode. Sinus rhythm with HR 68. No acute changes from previous EKG on 03/08/2024.     Assessment & Plan:   Syncope, unspecified syncope type -     CBC with Differential/Platelet -     Comprehensive metabolic panel with GFR -     EKG 12-Lead -     Orthostatic vital signs -     CT HEAD WO CONTRAST ( ); Future -     LONG TERM MONITOR (3-14 DAYS); Future -     Magnesium   -EKG completed today. Sinus rhythm. No acute findings. -Orthostatics completed. No acute findings.  -Ordered labs (CBC, CMP, and Magnesium ) for a reason of symc. Office will call with results and will be available via MyChart. -Ordered STAT CT head scan for syncopal episode. Advise to please call the office or send a MyChart message if she does not receive a phone call by tomorrow afternoon with an appointment.  -Ordered a portable heart monitor to look for rhythm changes for a reason of these symptoms. Advised she should receive monitor in 7-10 days. If not, please call the office or send MyChart message. -If this occurs again, please call 911 and go directly to the emergency department.  -Suspect these symptoms are related to taking Seroquel , would speak with psychiatry about these episodes. These episodes are occurring after taking this medication in particular based on patients report.   Bartosz Luginbill, NP

## 2024-05-18 NOTE — Progress Notes (Unsigned)
 EP to read.

## 2024-05-18 NOTE — Telephone Encounter (Signed)
 FYI Only or Action Required?: FYI only for provider.  Patient was last seen in primary care on 05/03/2024 by Ozell Heron HERO, MD.  Called Nurse Triage reporting Fall - Pt passed out and thinks she was unconscious  for 5-10 minutes.  Symptoms began yesterday.  Interventions attempted: Other: Called EMS.  Symptoms are: gradually worsening.  Triage Disposition: See HCP Within 4 Hours (Or PCP Triage)  Patient/caregiver understands and will follow disposition?: Yes                              Copied from CRM [id number]. Topic: Clinical - Red Word Triage >> May 18, 2024  8:22 AM Deaijah H wrote: Red Word that prompted transfer to Nurse Triage: [place] last night/ out for 5-10 minutes Reason for Disposition  [1] All other patients AND [2] now alert and feels fine  (Exception: SIMPLE FAINT due to stress, pain, prolonged standing, or suddenly standing)  Answer Assessment - Initial Assessment Questions 1. MECHANISM: How did the fall happen?     Just fell - like someone is pushing her shoulders 2. DOMESTIC VIOLENCE AND ELDER ABUSE SCREENING: Did you fall because someone pushed you or tried to hurt you? If Yes, ask: Are you safe now?     no 3. ONSET: When did the fall happen? (e.g., minutes, hours, or days ago)     Last night 4. LOCATION: What part of the body hit the ground? (e.g., back, buttocks, head, hips, knees, hands, head, stomach)     Fell forward 5. INJURY: Did you hurt (injure) yourself when you fell? If Yes, ask: What did you injure? Tell me more about this? (e.g., body area; type of injury; pain severity)     no 6. PAIN: Is there any pain? If Yes, ask: How bad is the pain? (e.g., Scale 0-10; or none, mild,      no 7. SIZE: For cuts, bruises, or swelling, ask: How large is it? (e.g., inches or centimeters)      no  9. OTHER SYMPTOMS: Do you have any other symptoms? (e.g., dizziness, fever, weakness; new-onset or worsening).       No - just passes out 10. CAUSE: What do you think caused the fall (or falling)? (e.g., dizzy spell, tripped)       Unsure  Answer Assessment - Initial Assessment Questions 1. ONSET: How long were you unconscious? (e.g., minutes, seconds) When did it happen?     5-10  minutes 2. CONTENT: What happened during the period of unconsciousness? (e.g., seizure activity)      unknown 3. MENTAL STATUS: Alert and oriented now? (e.g., oriented x 3 = name, month, location)      yes 4. TRIGGER: What do you think caused the fainting? What were you doing just before you fainted?  (e.g., exercise, sudden standing up, prolonged standing)     Unsure - seems to happen 1-1.5 after taking evening medications 5. RECURRENT SYMPTOM: Have you ever passed out before? If Yes, ask: When was the last time? and What happened that time?      6-8 years, and more recently a couple times a month 6. INJURY: Did you hurt yourself when you fell?      no 7. CARDIAC SYMPTOMS: Have you had any of the following symptoms: chest pain, difficulty breathing, palpitations?     no 8. NEUROLOGIC SYMPTOMS: Have you had any of the following symptoms: headache, numbness, vertigo, weakness?  Leg weakness 9. GI SYMPTOMS: Have you had any of the following symptoms: abdomen pain, vomiting, diarrhea, blood in stools?     no 10. OTHER SYMPTOMS: Do you have any other symptoms?       no  Protocols used: Falls and [place], [place]

## 2024-05-18 NOTE — Telephone Encounter (Signed)
 Appt today at 11:20am with Philippe.

## 2024-05-18 NOTE — Patient Instructions (Addendum)
-  It was a pressure to care for you today. -EKG completed today. Sinus rhythm. No acute findings. -Orthostatics completed. No acute findings.  -Ordered labs. Office will call with results and will be available via MyChart. -Ordered STAT CT head scan for syncopal episode. Please call the office or send a MyChart message if you do not receive a phone call by tomorrow afternoon with an appointment.  -Ordered a portable heart monitor to look for rhythm changes for a reason of these symptoms. You should receive monitor in 7-10 days. If not, please call the office or send MyChart message. -If this occurs again, please call 911 and go directly to the emergency department.  -Suspect these symptoms are related to taking Seroquel , would speak with psychiatry about these episodes.

## 2024-05-18 NOTE — Telephone Encounter (Signed)
 Noted- ok to close.

## 2024-05-19 ENCOUNTER — Encounter: Payer: Self-pay | Admitting: Physical Therapy

## 2024-05-19 ENCOUNTER — Ambulatory Visit: Admitting: Physical Therapy

## 2024-05-19 DIAGNOSIS — M25551 Pain in right hip: Secondary | ICD-10-CM

## 2024-05-19 DIAGNOSIS — M25561 Pain in right knee: Secondary | ICD-10-CM

## 2024-05-19 DIAGNOSIS — M6281 Muscle weakness (generalized): Secondary | ICD-10-CM

## 2024-05-19 DIAGNOSIS — M5459 Other low back pain: Secondary | ICD-10-CM

## 2024-05-19 DIAGNOSIS — R293 Abnormal posture: Secondary | ICD-10-CM

## 2024-05-19 DIAGNOSIS — R262 Difficulty in walking, not elsewhere classified: Secondary | ICD-10-CM

## 2024-05-19 NOTE — Therapy (Addendum)
 " OUTPATIENT PHYSICAL THERAPY TREATMENT/DC   Patient Name: Judy [name] MRN: [id number] DOB:1951-07-03, 73 y.o., female Today's Date: 05/19/2024  PHYSICAL THERAPY DISCHARGE SUMMARY  Visits from Start of Care: 6  Current functional level related to goals / functional outcomes: See below   Remaining deficits: See below   Education / Equipment: HEP   Patient agrees to discharge. Patient goals were partially met. Patient is being discharged due to not returning since the last visit.   END OF SESSION:  PT End of Session - 05/19/24 1310     Visit Number 6    Number of Visits 20    Date for PT Re-Evaluation 06/18/24    Authorization Type Medicare/[employer]    Progress Note Due on Visit 10    PT Start Time 1305   pt arrived late   PT Stop Time 1343    PT Time Calculation (min) 38 min    Activity Tolerance Patient limited by pain    Behavior During Therapy WFL for tasks assessed/performed               Past Medical History:  Diagnosis Date   Anxiety    Arthritis    Avascular necrosis (HCC)    Left hip   Avascular necrosis of bone of hip, 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   Bipolar 1 disorder (HCC)    COPD (chronic obstructive pulmonary disease) (HCC)    Depression    Dyspnea    Pneumonia    PTSD (post-traumatic stress disorder)    Past Surgical History:  Procedure Laterality Date   bone spurs foot Right    CATARACT EXTRACTION Bilateral    COLONOSCOPY     COLONOSCOPY  07/2022   HAND SURGERY Left    JOINT REPLACEMENT     TONSILLECTOMY     TONSILLECTOMY AND ADENOIDECTOMY     TOTAL HIP ARTHROPLASTY Left 03/19/2019   Procedure: LEFT TOTAL HIP ARTHROPLASTY ANTERIOR APPROACH;  Surgeon: Judy Lonni GRADE, MD;  Location: [facility];  Service: Orthopedics;  Laterality: Left;   TOTAL HIP ARTHROPLASTY Right 06/04/2019   Procedure: RIGHT TOTAL HIP ARTHROPLASTY ANTERIOR APPROACH;  Surgeon: Judy Lonni GRADE, MD;  Location: [facility];  Service: Orthopedics;  Laterality: Right;   TOTAL KNEE ARTHROPLASTY Left 12/28/2021   Procedure: Left TOTAL KNEE ARTHROPLASTY;  Surgeon: Judy Lonni GRADE, MD;  Location: [facility];  Service: Orthopedics;  Laterality: Left;   Patient Active Problem List   Diagnosis Date Noted   Coronary artery disease involving native coronary artery of native heart without angina pectoris 03/08/2024   Aortic atherosclerosis (HCC) 03/08/2024   Unilateral primary osteoarthritis, right knee 12/31/2023   Hyperlipidemia 07/04/2023   Diabetes mellitus without complication (HCC) 08/09/2022   Restless legs syndrome 08/09/2022   Status post total left knee replacement 12/28/2021   Primary osteoarthritis of left knee 10/25/2021   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) 11/30/2020   Status post total replacement of right hip 06/04/2019   Status post total replacement of left hip 03/19/2019   PTSD (post-traumatic stress disorder)    COPD (chronic obstructive pulmonary disease) (HCC)    Bipolar 1 disorder (HCC)    Anxiety    B12 deficiency 12/09/2018   Hyperglycemia 04/14/2017    PCP: Judy Heron HERO, MD   REFERRING PROVIDER: Dolphus Reiter, MD   REFERRING DIAG:  M17.11 (ICD-10-CM) - Primary osteoarthritis of right knee  R26.81 (ICD-10-CM) - Unstable  gait    THERAPY DIAG:  Acute pain of right knee  Muscle weakness (generalized)  Difficulty in walking, not elsewhere classified  Abnormal posture  Pain in right hip  Other low back pain  Rationale for Evaluation and Treatment: Rehabilitation  ONSET DATE: Months ago  SUBJECTIVE:   SUBJECTIVE STATEMENT: Fell on Monday, had to call EMS to help get her up.  Thinks she was out for a few minutes.  Went to a chiropractor Friday, and felt great.  Then got in an accident leaving the office.    PERTINENT HISTORY: History of Present Illness: Judy [name] is a 73 y.o. female with  ankylosing spondylitis, osteoarthritis and degenerative disc disease.  She had been receiving right knee joint viscosupplement injections at the knee pain center.  She has noticed improvement in her right knee joint pain.  Her left knee joint had replacement in 2023 which continues to hurt.  She states she has been noticing increased discomfort and swelling in her left knee and left foot.  She believes her left arm is swollen.  She had taken Cimzia  in the past and did not notice any difference on Cimzia .  She continues to have stiffness in her neck and back.  She states she was seen at the pain clinic and had facet joint injection in her lumbar spine 3 weeks ago which was helpful.  She is followed by Dr. Theophilus for COPD.  She is followed by cardiology for coronary artery disease.   PMH: COPD, left TKA, Rt THA, left THA, hand surgery, Bipolar, depression, avascular necrosis,   PAIN:  NPRS scale: see above Pain description: achy Aggravating factors: certain days are worse than other Relieving factors: over the counter meds, muscle relaxer, ice, nothing helps fully  PRECAUTIONS: Other: COPD  WEIGHT BEARING RESTRICTIONS: No  FALLS:  Has patient fallen in last 6 months? No  LIVING ENVIRONMENT: Lives with: lives with their family and lives alone Lives in: House/apartment Stairs: Yes: External: 4 steps; can reach both Has following equipment at home: Single point cane, shower chair, raised toilet seat  OCCUPATION: retired  PLOF: Independent  PATIENT GOALS: walk better and longer distances    OBJECTIVE:   DIAGNOSTIC FINDINGS:  10/28/2022:  1. Increased T2 signal in the T12-L1 disc, with some loss of cortical signal in the T12-L1 endplates. While this could be degenerative in nature, early discitis/osteomyelitis could have a similar appearance. Recommend correlation with inflammatory markers. 2. L3-L4 mild-to-moderate spinal canal stenosis and mild left neural foraminal narrowing, which  is new from the prior exam. Narrowing of the lateral recesses at this level could affect the descending L4 nerve roots. 3. L4-L5 narrowing of the left lateral recess, which could affect the descending left L5 nerve. 4. L5-S1 large left lateral osteophyte, which may abut the exiting left L5 nerve. 5. Atrophy of the right psoas muscle, which is new from the prior exam.  PATIENT SURVEYS:  Patient-Specific Activity Scoring Scheme  0 represents unable to perform. 10 represents able to perform at prior level. 0 1 2 3 4 5 6 7 8 9  10 (Date and Score)   Activity Eval  04/06/24 05/10/24   1. Walk without cane 3   8  2. Shower without sitting  1  1  3. Shop without electric cart 3 3  4.minor cleaning tasks 3 9  5.standing >/= 10 minutes 3 3  Score 4.6 4.8   Total score = sum of the activity scores/number of activities Minimum detectable  change (90%CI) for average score = 2 points Minimum detectable change (90%CI) for single activity score = 3 points  COGNITION: Overall cognitive status: WFL    SENSATION: WFL  POSTURE:  rounded shoulders, forward head, and decreased lumbar lordosis  PALPATION: 04/06/24: TTP Rt lumbar paraspinals and QL, lateral Rt hip and joint line of Rt knee.   LOWER EXTREMITY ROM:   ROM Right 04/06/24 Left eval  Hip flexion 90 c pain In low back and Rt hip 98 c pain in low back  Hip extension    Hip abduction    Hip adduction    Hip internal rotation    Hip external rotation    Knee flexion 125 130  Knee extension -5 -2  Ankle dorsiflexion    Ankle plantarflexion    Ankle inversion    Ankle eversion     (Blank rows = not tested)  LOWER EXTREMITY MMT:  MMT Right Eval 04/06/24 Left eval  Hip flexion 15.2 lbs 18.9 lbs  Hip extension    Hip abduction    Hip adduction    Hip internal rotation    Hip external rotation    Knee flexion 24.9 lbs 21.0 lbs  Knee extension 33.2 lbs 43.5 lbs  Ankle dorsiflexion    Ankle plantarflexion     Ankle inversion    Ankle eversion     (Blank rows = not tested)    FUNCTIONAL TESTS:  04/06/24: 5 time sit to stand: 25.71 seconds c UE support  04/29/24: BERG 50/56   GAIT: Distance walked: clinic distances  Assistive device utilized: Single point cane Level of assistance: Modified independence Comments: antalgic gait with mild trendelenburg                                                                                                                                                                        TODAY'S TREATMENT 05/19/24 TherAct Leg Press bil 100# 3x10 Forward step ups onto 4 step 2x10 bil; bil UE support Calf raises 2x10 with bil UE support Standing hip abduction x10 reps bil; L2 band Standing hip extension x 10 reps bil; L2 band  TherEx Single knee to chest 3x30 sec bil Supine piriformis stretch 3x30 sec bil  Therapeutic rest breaks needed throughout session due to fatigue   05/10/24 TherEx NuStep L4 x  10 min Supine piriformis stretch 3x30 sec bil Hooklying single limb clamshell 2x10 bil; L3 band Supine isometric abdominal contraction pushing bil UE's into red physio ball x 10  TherAct Bridges 2 x 10 c pursed lip breathing to prevent pt from holding her breathe Sit to stand x 10  Log rolling bil sides  Modalities:  Moist heat x 5 min to low back and bil knees  at end of session with bil LE elevated on bolster     05/05/24 TherEx NuStep L4 x 8 min Supine piriformis stretch 3x30 sec bil Hooklying single limb clamshell 2x10 bil; L3 band  TherAct Bridges 2x10 with L3 band isometric hold Sit to/from stand 2x5 reps  Moist heat x 10 min to neck and low back after session     04/29/24 Physical Performace Test BERG balance test - see above for details  TherEx Single knee to chest 3x30 sec bil  Supine piriformis stretch 3x30 sec bil Hooklying single limb clamshell 2x10 bil; L3 band Supine SAQ 2x10 bil; 3#   TherAct Sit to/from stand x 10  reps        PATIENT EDUCATION:  Education details: HEP, POC Person educated: Patient Education method: Programmer, Multimedia, Facilities Manager, Verbal cues, and Handouts Education comprehension: verbalized understanding, returned demonstration, and verbal cues required  HOME EXERCISE PROGRAM: Access Code: [email or web address] URL: [email or web address] Date: 04/06/2024 Prepared by: Delon Lunger  Exercises - Seated Long Arc Quad  - 1-2 x daily - 7 x weekly - 10 reps - Seated Small Alternating Straight Leg Lifts with Heel Touch  - 1-2 x daily - 7 x weekly - 10 reps - Supine Knee Extension Strengthening  - 1-2 x daily - 7 x weekly - 10 reps - 3 seconds hold  ASSESSMENT:  CLINICAL IMPRESSION: Pt continues to report increased fatigue and weakness but does feel she is stronger and pain has decreased since starting PT.  Will continue to benefit from PT to maximize function.   OBJECTIVE IMPAIRMENTS: decreased balance, decreased mobility, difficulty walking, decreased ROM, decreased strength, impaired flexibility, and pain.   ACTIVITY LIMITATIONS: lifting, bending, sitting, standing, squatting, sleeping, and stairs  PARTICIPATION LIMITATIONS: meal prep, cleaning, laundry, and community activity  PERSONAL FACTORS: 3+ comorbidities: see PMH above are also affecting patient's functional outcome.   REHAB POTENTIAL: Fair  to good, based on pt's significant co-morbidities and PMH see above  CLINICAL DECISION MAKING: Stable/uncomplicated  EVALUATION COMPLEXITY: Moderate   GOALS: Goals reviewed with patient? Yes  SHORT TERM GOALS: (target date for Short term goals are 3 weeks 04/27/2024)   1.  Patient will demonstrate independent use of home exercise program to maintain progress from in clinic treatments. Goal status: MET 04/29/24  LONG TERM GOALS: (target dates for all long term goals are 10 weeks  06/18/2024 )   1. Patient will demonstrate/report pain at worst less than or equal to 2/10  to facilitate minimal limitation in daily activity secondary to pain symptoms.  Goal status: New   2. Patient will demonstrate independent use of home exercise program to facilitate ability to maintain/progress functional gains from skilled physical therapy services.  Goal status: New   3. Patient will demonstrate Patient specific functional scale avg > or = 4.6 to indicate reduced disability due to condition.   Goal status: New   4.  Patient will demonstrate Rt  LE MMT by >/= 5 lbs from baseline throughout to faciltiate usual transfers, stairs, squatting at [facility] for daily life.   Goal status: New   5.  Patient will demonstrate up and down 4 steps with step to gait pattern using hand rail for support.  Goal status: New       PLAN:  PT FREQUENCY: 1-2x/week  PT DURATION: 10 weeks  PLANNED INTERVENTIONS: Can include 02853- PT Re-evaluation, 97110-Therapeutic exercises, 97530- Therapeutic activity, [id number]- Neuromuscular re-education, 97535- Self Care, 97140- Manual therapy, [id number]- Gait training, [phone]- Orthotic Fit/training, [phone]- Canalith  repositioning, 02886- Aquatic Therapy, [phone]- Electrical stimulation (unattended), [id number] Physical performance testing, 02983- Vasopneumatic device, [id number]- Ultrasound, [id number]- Traction (mechanical), [id number]- Ionotophoresis 4mg /ml Dexamethasone ,  [id number] - Needle insertion w/o injection 1 or 2 muscles, 20561 - Needle insertion w/o injection 3 or more muscles.   Patient/Family education, Balance training, Stair training, Taping, Dry Needling, Joint mobilization, Joint manipulation, Spinal manipulation, Spinal mobilization, Scar mobilization, Vestibular training, Visual/preceptual remediation/compensation, DME instructions, Cryotherapy, and Moist heat.  All performed as medically necessary.  All included unless contraindicated  PLAN FOR NEXT SESSION:  progress functional mobility, quad strengthening, core strengthening.   Next MD visit: 03/10/25  [name], PT, DPT 05/19/24 1:58 PM    "

## 2024-05-20 ENCOUNTER — Ambulatory Visit: Payer: Self-pay | Admitting: Family Medicine

## 2024-05-20 ENCOUNTER — Ambulatory Visit
Admission: RE | Admit: 2024-05-20 | Discharge: 2024-05-20 | Disposition: A | Discharge status: Home / Self Care | Source: Ambulatory Visit | Attending: Family Medicine | Admitting: Family Medicine

## 2024-05-20 DIAGNOSIS — D509 Iron deficiency anemia, unspecified: Secondary | ICD-10-CM

## 2024-05-20 DIAGNOSIS — R55 Syncope and collapse: Secondary | ICD-10-CM

## 2024-05-20 LAB — MAGNESIUM: Magnesium: 1.7 mg/dL (ref 1.5–2.5)

## 2024-05-21 ENCOUNTER — Other Ambulatory Visit

## 2024-05-21 DIAGNOSIS — D509 Iron deficiency anemia, unspecified: Secondary | ICD-10-CM

## 2024-05-25 ENCOUNTER — Ambulatory Visit: Payer: Self-pay | Admitting: Family Medicine

## 2024-05-25 LAB — IRON,TIBC AND FERRITIN PANEL
%SAT: 23 % (ref 16–45)
Ferritin: 58 ng/mL (ref 16–288)
Iron: 72 ug/dL (ref 45–160)
TIBC: 317 ug/dL (ref 250–450)

## 2024-05-26 ENCOUNTER — Other Ambulatory Visit: Payer: Self-pay | Admitting: Family Medicine

## 2024-05-26 DIAGNOSIS — R252 Cramp and spasm: Secondary | ICD-10-CM

## 2024-05-26 NOTE — Telephone Encounter (Signed)
 Copied from CRM #[id number]. Topic: Clinical - Medication Refill >> May 26, 2024  9:37 AM Tanazia G wrote: Medication:  cyclobenzaprine  (FLEXERIL ) 10 MG tablet    Has the patient contacted their pharmacy? Yes (Agent: If no, request that the patient contact the pharmacy for the refill. If patient does not wish to contact the pharmacy document the reason why and proceed with request.) (Agent: If yes, when and what did the pharmacy advise?)  This is the patient's preferred pharmacy:  [facility] [address] [place] - [address] [place] [address] Phone: [phone] Fax: [phone]  Is this the correct pharmacy for this prescription? Yes If no, delete pharmacy and type the correct one.   Has the prescription been filled recently? Yes  Is the patient out of the medication? Yes  Has the patient been seen for an appointment in the last year OR does the patient have an upcoming appointment? Yes  Can we respond through MyChart? Yes  Agent: Please be advised that Rx refills may take up to 3 business days. We ask that you follow-up with your pharmacy.

## 2024-05-28 ENCOUNTER — Other Ambulatory Visit: Payer: Self-pay | Admitting: Pulmonary Disease

## 2024-05-28 ENCOUNTER — Ambulatory Visit: Payer: Self-pay

## 2024-05-28 NOTE — Telephone Encounter (Signed)
 FYI Only or Action Required?: FYI only for provider.  Patient was last seen in primary care on 05/18/2024 by Billy Philippe SAUNDERS, NP.  Called Nurse Triage reporting Dizziness.  Symptoms began a week ago.  Interventions attempted: Rest, hydration, or home remedies.  Symptoms are: gradually worsening.  Triage Disposition: See PCP When Office is Open (Within 3 Days)  Patient/caregiver understands and will follow disposition?: Yes           Copied from CRM #[id number]. Topic: Clinical - Red Word Triage >> May 28, 2024  4:38 PM Shereese L wrote: [facility] that prompted transfer to Nurse Triage: Patient feel likes going to fall over once she stand She feels dizzy, nauseas, and like some is pushing her over and knees start to buckle. It lasted an for 20 min or more Patient stated she sweating in her sleep Reason for Disposition  [1] MODERATE dizziness (e.g., interferes with normal activities) AND [2] has been evaluated by doctor (or NP/PA) for this  Answer Assessment - Initial Assessment Questions Last week had an episode of syncope on Monday. Saw PCP on Tuesday for symptoms. States she has had testing done and states she is still having episodes. She states symptoms seem to start 1-1.5 hours after taking her daily medications. Tried to sit up some last night.    1. DESCRIPTION: Describe your dizziness.     States she has episodes where  2. LIGHTHEADED: Do you feel lightheaded? (e.g., somewhat faint, woozy, weak upon standing)     Yes has knee weakness (states symptoms  3. VERTIGO: Do you feel like either you or the room is spinning or tilting? (i.e., vertigo)     No 4. SEVERITY: How bad is it?  Do you feel like you are going to faint? Can you stand and walk?     Yes, she can stand now.  5. ONSET:  When did the dizziness begin?     States symptoms have been ongoing.  6. AGGRAVATING FACTORS: Does anything make it worse? (e.g., standing, change in head position)      When trying to stand up, symptoms worsen   7. HEART RATE: Can you tell me your heart rate? How many beats in 15 seconds?  (Note: Not all patients can do this.)       97-98 8. CAUSE: What do you think is causing the dizziness? (e.g., decreased fluids or food, diarrhea, emotional distress, heat exposure, new medicine, sudden standing, vomiting; unknown)     Unsure, has had syncope episodes in the past.  9. RECURRENT SYMPTOM: Have you had dizziness before? If Yes, ask: When was the last time? What happened that time?     Yes, last night she had an episode.  10. OTHER SYMPTOMS: Do you have any other symptoms? (e.g., fever, chest pain, vomiting, diarrhea, bleeding) Night sweats, feeling clammy, and feels like her knees buckle.  Protocols used: Dizziness - Lightheadedness-A-AH

## 2024-05-31 ENCOUNTER — Ambulatory Visit (INDEPENDENT_AMBULATORY_CARE_PROVIDER_SITE_OTHER): Admitting: Family Medicine

## 2024-05-31 ENCOUNTER — Encounter: Payer: Self-pay | Admitting: Family Medicine

## 2024-05-31 VITALS — BP 100/60 | HR 74 | Temp 97.8°F | Wt 212.0 lb

## 2024-05-31 DIAGNOSIS — R42 Dizziness and giddiness: Secondary | ICD-10-CM | POA: Diagnosis not present

## 2024-05-31 DIAGNOSIS — F319 Bipolar disorder, unspecified: Secondary | ICD-10-CM | POA: Diagnosis not present

## 2024-05-31 DIAGNOSIS — R252 Cramp and spasm: Secondary | ICD-10-CM

## 2024-05-31 MED ORDER — HYDROCODONE-ACETAMINOPHEN 5-325 MG PO TABS
1.0000 | ORAL_TABLET | Freq: Four times a day (QID) | ORAL | 0 refills | Status: DC | PRN
Start: 2024-05-31 — End: 2024-08-19

## 2024-05-31 MED ORDER — CYCLOBENZAPRINE HCL 10 MG PO TABS: 5.0000 mg | ORAL_TABLET | Freq: Three times a day (TID) | ORAL | 0 refills | Status: DC | PRN

## 2024-05-31 NOTE — Telephone Encounter (Signed)
 Patient informed of the message below.

## 2024-05-31 NOTE — Telephone Encounter (Signed)
 Reviewed note from [place], work up was unremarkable and blood work was also unremarkable. It is most likely a result of her medications. I advise that she speak with her psychiatrist about the seroquel  dose. She may also hold the cyclobenzaprine  to see if this is causing the dizziness also.

## 2024-05-31 NOTE — Progress Notes (Signed)
   Subjective:    Patient ID: Judy [name], female    DOB: 02/22/1951, 73 y.o.   MRN: [id number]  HPI Here for several issues. First about 8 months ago she began to experience spells where she felt lightheaded and she would feel as if someone was pushing her forward from behind. This almost always occurs in the eveing about one hour after taking her evening medications (which include Quetiapine ). These episodes start after 5 seconds after gets up out of a chair to walk. Usually she can catch herself on a wall or a piece of furniture ans it passes after 30 seconds. However this happened on 05-18-24 and she actually lost consciousness and fell. She called her neighbor who then called EMS. She felt fine when they arrived so they did not transport her to the ED. There were no injuries from this fall. Since then the spells have become more frequent, and she has had 3 of them in the past week. No SOB or chest pain or palpitations. She was seen here 2 weeks ago and labs were ordered that were all normal except a slightly low Hgb at 11.8. She also had a normal non-contrasted head CT. She is currently wearing a 30 day heart monitor. She has become frustrated that the cause for these spells has not been found. Of note she takes 800 mg of Quetiapine  every evening per her psychiatrist in [place]. She has Bipolar disorder, and this has working for her for several years.    Review of Systems  Constitutional: Negative.   Respiratory: Negative.    Cardiovascular: Negative.   Neurological:  Positive for weakness and light-headedness. Negative for dizziness.       Objective:   Physical Exam Constitutional:      Comments: In a wheelchair   Cardiovascular:     Rate and Rhythm: Normal rate and regular rhythm.     Pulses: Normal pulses.     Heart sounds: Normal heart sounds.  Pulmonary:     Effort: Pulmonary effort is normal.     Breath sounds: Normal breath sounds.  Neurological:     Mental Status:  She is alert.           Assessment & Plan:  She is having episodes of lightheadedness and has had one true syncopal episode. In some ways this sounds like a problem with orthostasis, but not in other ways. She is wearing the heart monitor, so we will see what these results will be. I do think that side effects of the Quetiapine  for her bipolar disorder are playing a role, so I advised her to ask her psychiatrist about this. She will follow up with Dr. Ozell. Garnette Olmsted, MD

## 2024-06-03 ENCOUNTER — Encounter: Payer: Self-pay | Admitting: Family Medicine

## 2024-06-03 ENCOUNTER — Ambulatory Visit: Payer: Medicare Other | Admitting: Family Medicine

## 2024-06-03 VITALS — BP 110/64 | HR 76 | Temp 97.7°F | Ht 70.0 in

## 2024-06-03 DIAGNOSIS — R55 Syncope and collapse: Secondary | ICD-10-CM

## 2024-06-03 DIAGNOSIS — E119 Type 2 diabetes mellitus without complications: Secondary | ICD-10-CM | POA: Diagnosis not present

## 2024-06-03 DIAGNOSIS — I951 Orthostatic hypotension: Secondary | ICD-10-CM

## 2024-06-03 LAB — POCT GLYCOSYLATED HEMOGLOBIN (HGB A1C): Hemoglobin A1C: 5.8 % — AB (ref 4.0–5.6)

## 2024-06-03 MED ORDER — BLOOD PRESSURE MONITOR DEVI: 1.0000 | Freq: Once | 0 refills | Status: AC

## 2024-06-03 NOTE — Patient Instructions (Signed)
 Start wearing compression stockings -- mild to moderate compression  Increase salt intake to support blood pressure

## 2024-06-03 NOTE — Progress Notes (Unsigned)
 Established Patient Office Visit  Subjective   Patient ID: Judy [name], female    DOB: 1951/03/24  Age: 73 y.o. MRN: [id number]  Chief Complaint  Patient presents with   Medical Management of Chronic Issues    HPI Discussed the use of AI scribe software for clinical note transcription with the patient, who gave verbal consent to proceed.  History of Present Illness   Judy [name] is a 73 year old female who presents with episodes of feeling like she is going to pass out.  She experiences episodes of feeling like she is going to fall forward, lasting about five to ten minutes, occurring once or twice a month for about a year. A significant episode three weeks ago resulted in a fall and unconsciousness for five to ten minutes, with over half an hour needed for recovery. Another recent episode lasted about forty minutes. No dizziness, nausea, or other symptoms are present during these episodes.  These episodes often occur about an hour and a half after dinner and after taking her evening medications, which include Seroquel  and atorvastatin . Her Seroquel  dosage was recently adjusted from 800 mg to 600 mg and then back to 800 mg. She also takes cyclobenzaprine  as needed for muscle pain, but the episodes occur even when she is not taking it.  She has been experiencing worsening leg and hand cramps and has started taking magnesium . She also reports knee pain for which she receives cortisone injections and takes hydrocodone  as needed. She is undergoing physical therapy for her knee but denies any episodes of feeling like she will pass out during these sessions.  She has been wearing a heart monitor since Sunday and had a CT scan which showed no abnormalities. An echocardiogram from May 2024 showed normal heart function. Her blood pressure has been lower than usual during recent visits, though she typically has normal readings.  She has been on Mounjaro  since February, with a  dosage increase in April, but the episodes predate this medication. She reports new sweating spells in the past week, unrelated to the episodes of feeling like she is going to pass out.      Current Outpatient Medications  Medication Instructions   ALBUTEROL  IN As needed   aspirin  EC 81 mg, Oral, Daily, Swallow whole.   atorvastatin  (LIPITOR) 80 mg, Oral, Daily   Blood Pressure Monitor DEVI 1 each, Does not apply,  Once   cyclobenzaprine  (FLEXERIL ) 5-10 mg, Oral, 3 times daily PRN   FLUoxetine (PROZAC) 20 mg, Daily   Fluticasone -Umeclidin-Vilant (TRELEGY ELLIPTA ) 100-62.5-25 MCG/ACT AEPB INHALE 1 PUFF ONCE DAILY   HYDROcodone -acetaminophen  (NORCO/VICODIN) 5-325 MG tablet 1 tablet, Oral, Every 6 hours PRN   QUEtiapine  (SEROQUEL ) 600 mg, Daily at bedtime   tirzepatide  (MOUNJARO ) 5 mg, Subcutaneous, Weekly    Patient Active Problem List   Diagnosis Date Noted   Coronary artery disease involving native coronary artery of native heart without angina pectoris 03/08/2024   Aortic atherosclerosis (HCC) 03/08/2024   Unilateral primary osteoarthritis, right knee 12/31/2023   Hyperlipidemia 07/04/2023   Diabetes mellitus without complication (HCC) 08/09/2022   Restless legs syndrome 08/09/2022   Status post total left knee replacement 12/28/2021   Primary osteoarthritis of left knee 10/25/2021   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     Objective:     BP 110/64   Pulse 76   Temp 97.7 F (36.5 C) (Oral)   Ht 5' 10 (1.778 m)   SpO2 98%   BMI 30.42 kg/m    Physical Exam Vitals  reviewed.  Constitutional:      Appearance: Normal appearance. She is well-groomed. She is obese.  Eyes:     Conjunctiva/sclera: Conjunctivae normal.  Neck:     Thyroid : No thyromegaly.  Cardiovascular:     Rate and Rhythm: Normal rate and regular rhythm.     Pulses: Normal pulses.     Heart sounds: S1 normal and S2 normal.  Pulmonary:     Effort: Pulmonary effort is normal.     Breath sounds: Normal breath sounds and air entry.  Abdominal:     General: Bowel sounds are normal.  Musculoskeletal:     Right lower leg: No edema.     Left lower leg: No edema.  Neurological:     Mental Status: She is alert and oriented to person, place, and time. Mental status is at baseline.     Gait: Gait is intact.  Psychiatric:        Mood and Affect: Mood and affect normal.        Speech: Speech normal.        Behavior: Behavior normal.        Judgment: Judgment normal.    Orthostatic Vitals for the past 48 hrs (Last 6 readings):  BP Pulse Patient Position (if appropriate) BP- Standing at 0 minutes Pulse- Standing at 0 minutes BP- Sitting Pulse- Sitting BP- Lying Pulse- Lying  06/03/24 1305 110/64 76 -- -- -- -- -- -- --  06/03/24 1348 -- -- Orthostatic Vitals 98/70 85 116/68 85 116/70 76     Results for orders placed or performed in visit on 06/03/24  POC HgB A1c  Result Value Ref Range   Hemoglobin A1C 5.8 (A) 4.0 - 5.6 %   HbA1c POC (<> result, manual entry)     HbA1c, POC (prediabetic range)     HbA1c, POC (controlled diabetic range)      Last metabolic panel Lab Results  Component Value Date   GLUCOSE 97 05/18/2024   NA 139 05/18/2024   K 4.5 05/18/2024   CL 106 05/18/2024   CO2 26 05/18/2024   BUN 20 05/18/2024   CREATININE 1.00 05/18/2024   GFR 56.36 (L) 05/18/2024   CALCIUM  9.4 05/18/2024   PROT 6.8 05/18/2024   ALBUMIN 3.8 05/18/2024   BILITOT 0.4 05/18/2024   ALKPHOS 150 (H) 05/18/2024   AST 17 05/18/2024   ALT 22 05/18/2024   ANIONGAP 5 12/29/2021    CT head  from 05/20/2024   EXAM: CT HEAD WITHOUT CONTRAST   TECHNIQUE: Contiguous axial images were obtained from the base of the skull through the vertex without intravenous contrast.   RADIATION DOSE REDUCTION: This exam was performed according to the departmental dose-optimization program which includes automated exposure control, adjustment of the mA and/or kV according to patient size and/or use of iterative reconstruction technique.   COMPARISON:  None Available.   FINDINGS: Brain: No evidence of acute infarction, hemorrhage, hydrocephalus, extra-axial collection or mass lesion/mass effect.   Vascular: No hyperdense vessel.  Skull: No acute fracture.   Sinuses/Orbits: Clear sinuses.  No acute orbital findings.   Other: No mastoid effusions.   IMPRESSION: No evidence of acute intracranial abnormality.    The ASCVD Risk score (Arnett DK, et al., 2019) failed to calculate for the following reasons:   The valid total cholesterol range is 130 to 320 mg/dL    Assessment & Plan:  Diabetes mellitus without complication (HCC) -     POCT glycosylated hemoglobin (Hb A1C) -     Collection capillary blood specimen  Orthostatic hypotension -     Blood Pressure Monitor; 1 each by Does not apply route once for 1 dose.  Dispense: 1 each; Refill: 0  Syncope, unspecified syncope type -     US  Carotid Bilateral; Future  I personally spent a total of 30 minutes in the care of the patient today including getting/reviewing separately obtained history, performing a medically appropriate exam/evaluation, counseling and educating, placing orders, documenting clinical information in the EHR, independently interpreting results, and communicating results.     Orthostatic hypotension with recurrent syncope Recurrent presyncope episodes. Blood pressure lower than usual. Suspected orthostatic hypotension. Differential includes medication effects. Ongoing workup includes heart monitor which she is  still wearing. Echocardiogram normal. No clear etiology identified. - Order carotid artery ultrasound. - Prescribe compression stockings (15-20 mmHg). - Increase salt and water  intake. - Provide blood pressure cuff for home monitoring. - Instruct to monitor blood pressure during episodes. - Consider medication adjustment if nonpharmacologic measures fail.  Right knee pain Chronic pain requiring cortisone injections. Undergoing physical therapy.  Muscle cramps Worsening leg and hand cramps. Previously advised magnesium  supplementation. She is on atorvastatin  80 mg daily which may be contributing. If her cardiac work up is negative then we could potentially stop the statin for about 2 weeks to determine if the cramps resolve off of the medication.  Excessive sweating Recent onset of sweating spells not related to syncope. No clear etiology identified but could be vasovagal or hypoglycemia, which would fit the clinical picture       Return in about 6 months (around 12/04/2024) for DM.    Heron CHRISTELLA Sharper, MD

## 2024-06-04 ENCOUNTER — Encounter: Admitting: Physical Therapy

## 2024-06-04 ENCOUNTER — Ambulatory Visit (INDEPENDENT_AMBULATORY_CARE_PROVIDER_SITE_OTHER): Admitting: Physical Therapy

## 2024-06-04 DIAGNOSIS — M25561 Pain in right knee: Secondary | ICD-10-CM

## 2024-06-04 NOTE — Therapy (Addendum)
 [facility] [address] [place], [place], [address] Phone: [phone]   Fax:  [phone]  Patient Details  Name: Judy [name] MRN: [id number] Date of Birth: 20-Mar-1951 Referring Provider:  Dolphus Reiter, MD  Encounter Date: 06/04/2024  Pt arrived to session reporting not feeling well and increased knee pain with limited ability to ambulate.  Offered to try session, pt requesting to cancel at this time as she had injection in knee yesterday.  No charge for today.   [name], PT, DPT 06/04/24 11:21 AM   [facility] [address] [place], [place], [address] Phone: [phone]   Fax:  [phone]

## 2024-06-09 ENCOUNTER — Ambulatory Visit
Admission: RE | Admit: 2024-06-09 | Discharge: 2024-06-09 | Disposition: A | Discharge status: Home / Self Care | Source: Ambulatory Visit | Attending: Family Medicine | Admitting: Family Medicine

## 2024-06-09 DIAGNOSIS — R55 Syncope and collapse: Secondary | ICD-10-CM

## 2024-06-10 ENCOUNTER — Telehealth: Payer: Self-pay | Admitting: Cardiology

## 2024-06-10 ENCOUNTER — Encounter: Admitting: Physical Therapy

## 2024-06-10 NOTE — Telephone Encounter (Signed)
 Pt calling about a referral to see EP. Please advise.

## 2024-06-10 NOTE — Therapy (Incomplete)
 OUTPATIENT PHYSICAL THERAPY TREATMENT   Patient Name: Judy [name] MRN: [id number] DOB:07/15/1951, 73 y.o., female Today's Date: 06/10/2024  END OF SESSION:         Past Medical History:  Diagnosis Date   Anxiety    Arthritis    Avascular necrosis (HCC)    Left hip   Avascular necrosis of bone of hip, 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   Bipolar 1 disorder (HCC)    COPD (chronic obstructive pulmonary disease) (HCC)    Depression    Dyspnea    Pneumonia    PTSD (post-traumatic stress disorder)    Past Surgical History:  Procedure Laterality Date   bone spurs foot Right    CATARACT EXTRACTION Bilateral    COLONOSCOPY     COLONOSCOPY  07/2022   HAND SURGERY Left    JOINT REPLACEMENT     TONSILLECTOMY     TONSILLECTOMY AND ADENOIDECTOMY     TOTAL HIP ARTHROPLASTY Left 03/19/2019   Procedure: LEFT TOTAL HIP ARTHROPLASTY ANTERIOR APPROACH;  Surgeon: Judy Lonni GRADE, MD;  Location: [facility];  Service: Orthopedics;  Laterality: Left;   TOTAL HIP ARTHROPLASTY Right 06/04/2019   Procedure: RIGHT TOTAL HIP ARTHROPLASTY ANTERIOR APPROACH;  Surgeon: Judy Lonni GRADE, MD;  Location: [facility];  Service: Orthopedics;  Laterality: Right;   TOTAL KNEE ARTHROPLASTY Left 12/28/2021   Procedure: Left TOTAL KNEE ARTHROPLASTY;  Surgeon: Judy Lonni GRADE, MD;  Location: [facility];  Service: Orthopedics;  Laterality: Left;   Patient Active Problem List   Diagnosis Date Noted   Coronary artery disease involving native coronary artery of native heart without angina pectoris 03/08/2024   Aortic atherosclerosis (HCC) 03/08/2024   Unilateral primary osteoarthritis, right knee 12/31/2023   Hyperlipidemia 07/04/2023   Diabetes mellitus without complication (HCC) 08/09/2022   Restless legs syndrome 08/09/2022   Status post total left knee replacement 12/28/2021   Primary osteoarthritis of left knee 10/25/2021   Ankylosing  spondylitis 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) 11/30/2020   Status post total replacement of right hip 06/04/2019   Status post total replacement of left hip 03/19/2019   PTSD (post-traumatic stress disorder)    COPD (chronic obstructive pulmonary disease) (HCC)    Bipolar 1 disorder (HCC)    Anxiety    B12 deficiency 12/09/2018   Hyperglycemia 04/14/2017    PCP: Judy Heron HERO, MD   REFERRING PROVIDER: Dolphus Reiter, MD   REFERRING DIAG:  M17.11 (ICD-10-CM) - Primary osteoarthritis of right knee  R26.81 (ICD-10-CM) - Unstable gait    THERAPY DIAG:  No diagnosis found.  Rationale for Evaluation and Treatment: Rehabilitation  ONSET DATE: Months ago  SUBJECTIVE:   SUBJECTIVE STATEMENT: *** Fell on Monday, had to call EMS to help get her up.  Thinks she was out for a few minutes.  Went to a chiropractor Friday, and felt great.  Then got in an accident leaving the office.    PERTINENT HISTORY: History of Present Illness: Judy [name] is a 73 y.o. female with ankylosing spondylitis, osteoarthritis and degenerative disc disease.  She had been receiving right knee joint viscosupplement injections at the knee pain center.  She has noticed improvement in her right knee joint pain.  Her left knee joint had replacement in 2023 which continues to hurt.  She states she has been noticing increased discomfort and swelling in her left knee and left foot.  She believes her left arm is swollen.  She  had taken Cimzia  in the past and did not notice any difference on Cimzia .  She continues to have stiffness in her neck and back.  She states she was seen at the pain clinic and had facet joint injection in her lumbar spine 3 weeks ago which was helpful.  She is followed by Dr. Theophilus for COPD.  She is followed by cardiology for coronary artery disease.   PMH: COPD, left TKA, Rt THA, left THA, hand surgery, Bipolar, depression, avascular necrosis,   PAIN:  NPRS scale: see above Pain  description: achy Aggravating factors: certain days are worse than other Relieving factors: over the counter meds, muscle relaxer, ice, nothing helps fully  PRECAUTIONS: Other: COPD  WEIGHT BEARING RESTRICTIONS: No  FALLS:  Has patient fallen in last 6 months? No  LIVING ENVIRONMENT: Lives with: lives with their family and lives alone Lives in: House/apartment Stairs: Yes: External: 4 steps; can reach both Has following equipment at home: Single point cane, shower chair, raised toilet seat  OCCUPATION: retired  PLOF: Independent  PATIENT GOALS: walk better and longer distances    OBJECTIVE:   DIAGNOSTIC FINDINGS:  10/28/2022:  1. Increased T2 signal in the T12-L1 disc, with some loss of cortical signal in the T12-L1 endplates. While this could be degenerative in nature, early discitis/osteomyelitis could have a similar appearance. Recommend correlation with inflammatory markers. 2. L3-L4 mild-to-moderate spinal canal stenosis and mild left neural foraminal narrowing, which is new from the prior exam. Narrowing of the lateral recesses at this level could affect the descending L4 nerve roots. 3. L4-L5 narrowing of the left lateral recess, which could affect the descending left L5 nerve. 4. L5-S1 large left lateral osteophyte, which may abut the exiting left L5 nerve. 5. Atrophy of the right psoas muscle, which is new from the prior exam.  PATIENT SURVEYS:  Patient-Specific Activity Scoring Scheme  0 represents "unable to perform." 10 represents "able to perform at prior level. 0 1 2 3 4 5 6 7 8 9  10 (Date and Score)   Activity Eval  04/06/24 05/10/24   1. Walk without cane 3   8  2. Shower without sitting  1  1  3. Shop without electric cart 3 3  4.minor cleaning tasks 3 9  5.standing >/= 10 minutes 3 3  Score 4.6 4.8   Total score = sum of the activity scores/number of activities Minimum detectable change (90%CI) for average score = 2 points Minimum  detectable change (90%CI) for single activity score = 3 points  COGNITION: Overall cognitive status: WFL    SENSATION: WFL  POSTURE:  rounded shoulders, forward head, and decreased lumbar lordosis  PALPATION: 04/06/24: TTP Rt lumbar paraspinals and QL, lateral Rt hip and joint line of Rt knee.   LOWER EXTREMITY ROM:   ROM Right 04/06/24 Left eval  Hip flexion 90 c pain In low back and Rt hip 98 c pain in low back  Hip extension    Hip abduction    Hip adduction    Hip internal rotation    Hip external rotation    Knee flexion 125 130  Knee extension -5 -2  Ankle dorsiflexion    Ankle plantarflexion    Ankle inversion    Ankle eversion     (Blank rows = not tested)  LOWER EXTREMITY MMT:  MMT Right Eval 04/06/24 Left eval  Hip flexion 15.2 lbs 18.9 lbs  Hip extension    Hip abduction    Hip adduction  Hip internal rotation    Hip external rotation    Knee flexion 24.9 lbs 21.0 lbs  Knee extension 33.2 lbs 43.5 lbs  Ankle dorsiflexion    Ankle plantarflexion    Ankle inversion    Ankle eversion     (Blank rows = not tested)    FUNCTIONAL TESTS:  04/06/24: 5 time sit to stand: 25.71 seconds c UE support  04/29/24: BERG 50/56   GAIT: Distance walked: clinic distances  Assistive device utilized: Single point cane Level of assistance: Modified independence Comments: antalgic gait with mild trendelenburg                                                                                                                                                                        TODAY'S TREATMENT 06/10/24 ***   05/19/24 TherAct Leg Press bil 100# 3x10 Forward step ups onto 4 step 2x10 bil; bil UE support Calf raises 2x10 with bil UE support Standing hip abduction x10 reps bil; L2 band Standing hip extension x 10 reps bil; L2 band  TherEx Single knee to chest 3x30 sec bil Supine piriformis stretch 3x30 sec bil  Therapeutic rest breaks needed throughout  session due to fatigue   05/10/24 TherEx NuStep L4 x  10 min Supine piriformis stretch 3x30 sec bil Hooklying single limb clamshell 2x10 bil; L3 band Supine isometric abdominal contraction pushing bil UE's into red physio ball x 10  TherAct Bridges 2 x 10 c pursed lip breathing to prevent pt from holding her breathe Sit to stand x 10  Log rolling bil sides  Modalities:  Moist heat x 5 min to low back and bil knees at end of session with bil LE elevated on bolster     05/05/24 TherEx NuStep L4 x 8 min Supine piriformis stretch 3x30 sec bil Hooklying single limb clamshell 2x10 bil; L3 band  TherAct Bridges 2x10 with L3 band isometric hold Sit to/from stand 2x5 reps  Moist heat x 10 min to neck and low back after session     04/29/24 Physical Performace Test BERG balance test - see above for details  TherEx Single knee to chest 3x30 sec bil  Supine piriformis stretch 3x30 sec bil Hooklying single limb clamshell 2x10 bil; L3 band Supine SAQ 2x10 bil; 3#   TherAct Sit to/from stand x 10 reps        PATIENT EDUCATION:  Education details: HEP, POC Person educated: Patient Education method: Programmer, multimedia, Facilities manager, Verbal cues, and Handouts Education comprehension: verbalized understanding, returned demonstration, and verbal cues required  HOME EXERCISE PROGRAM: Access Code: [email or web address] URL: [email or web address] Date: 04/06/2024 Prepared by: Delon Lunger  Exercises - Seated Long Arc Quad  - 1-2 x daily - 7 x weekly -  10 reps - Seated Small Alternating Straight Leg Lifts with Heel Touch  - 1-2 x daily - 7 x weekly - 10 reps - Supine Knee Extension Strengthening  - 1-2 x daily - 7 x weekly - 10 reps - 3 seconds hold  ASSESSMENT:  CLINICAL IMPRESSION: *** Pt continues to report increased fatigue and weakness but does feel she is stronger and pain has decreased since starting PT.  Will continue to benefit from PT to maximize  function.   OBJECTIVE IMPAIRMENTS: decreased balance, decreased mobility, difficulty walking, decreased ROM, decreased strength, impaired flexibility, and pain.   ACTIVITY LIMITATIONS: lifting, bending, sitting, standing, squatting, sleeping, and stairs  PARTICIPATION LIMITATIONS: meal prep, cleaning, laundry, and community activity  PERSONAL FACTORS: 3+ comorbidities: see PMH above are also affecting patient's functional outcome.   REHAB POTENTIAL: Fair  to good, based on pt's significant co-morbidities and PMH see above  CLINICAL DECISION MAKING: Stable/uncomplicated  EVALUATION COMPLEXITY: Moderate   GOALS: Goals reviewed with patient? Yes  SHORT TERM GOALS: (target date for Short term goals are 3 weeks 04/27/2024)   1.  Patient will demonstrate independent use of home exercise program to maintain progress from in clinic treatments. Goal status: MET 04/29/24  LONG TERM GOALS: (target dates for all long term goals are 10 weeks  06/18/2024 )   1. Patient will demonstrate/report pain at worst less than or equal to 2/10 to facilitate minimal limitation in daily activity secondary to pain symptoms.  Goal status: New   2. Patient will demonstrate independent use of home exercise program to facilitate ability to maintain/progress functional gains from skilled physical therapy services.  Goal status: New   3. Patient will demonstrate Patient specific functional scale avg > or = 4.6 to indicate reduced disability due to condition.   Goal status: New   4.  Patient will demonstrate Rt  LE MMT by >/= 5 lbs from baseline throughout to faciltiate usual transfers, stairs, squatting at [facility] for daily life.   Goal status: New   5.  Patient will demonstrate up and down 4 steps with step to gait pattern using hand rail for support.  Goal status: New       PLAN:  PT FREQUENCY: 1-2x/week  PT DURATION: 10 weeks  PLANNED INTERVENTIONS: Can include 02853- PT Re-evaluation,  97110-Therapeutic exercises, 97530- Therapeutic activity, [id number]- Neuromuscular re-education, 97535- Self Care, 97140- Manual therapy, [phone]- Gait training, [phone]- Orthotic Fit/training, [phone]- Canalith repositioning, [id number]- Aquatic Therapy, [phone]- Electrical stimulation (unattended), [id number] Physical performance testing, 97016- Vasopneumatic device, [id number]- Ultrasound, [id number]- Traction (mechanical), [id number]- Ionotophoresis 4mg /ml Dexamethasone ,  79439 - Needle insertion w/o injection 1 or 2 muscles, 20561 - Needle insertion w/o injection 3 or more muscles.   Patient/Family education, Balance training, Stair training, Taping, Dry Needling, Joint mobilization, Joint manipulation, Spinal manipulation, Spinal mobilization, Scar mobilization, Vestibular training, Visual/preceptual remediation/compensation, DME instructions, Cryotherapy, and Moist heat.  All performed as medically necessary.  All included unless contraindicated  PLAN FOR NEXT SESSION:  progress functional mobility, quad strengthening, core strengthening.   Next MD visit: 03/10/25  [name], PT, DPT 06/10/24 7:23 AM

## 2024-06-10 NOTE — Telephone Encounter (Signed)
 Called patient and made patient aware that this will forward to our schedule team  to  get referral for EP scheduled. Understanding verbalized.

## 2024-06-11 NOTE — Telephone Encounter (Signed)
 Made patient aware  via voicemail there is no referral for EP. Per Dr. [place] follow up with PCP and Psych. Left message to call office for any questions

## 2024-06-14 ENCOUNTER — Telehealth: Payer: Self-pay | Admitting: Physical Therapy

## 2024-06-14 ENCOUNTER — Encounter: Admitting: Physical Therapy

## 2024-06-14 NOTE — Telephone Encounter (Signed)
 I called pt to follow up why she missed her 1:45 PT appointment. I left a message with pt about her upcoming PT appointment on 06/16/24 at 1:00 with Corean Ku, PT, DTP.  Delon Lunger, PT, MPT 06/14/24 2:16 PM

## 2024-06-15 ENCOUNTER — Ambulatory Visit: Payer: Self-pay | Admitting: Family Medicine

## 2024-06-16 ENCOUNTER — Telehealth: Payer: Self-pay | Admitting: Family Medicine

## 2024-06-16 ENCOUNTER — Encounter: Admitting: Physical Therapy

## 2024-06-16 NOTE — Therapy (Incomplete)
 OUTPATIENT PHYSICAL THERAPY TREATMENT   Patient Name: Judy [name] MRN: [id number] DOB:02/25/1951, 73 y.o., female Today's Date: 06/16/2024  END OF SESSION:         Past Medical History:  Diagnosis Date   Anxiety    Arthritis    Avascular necrosis (HCC)    Left hip   Avascular necrosis of bone of hip, 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   Bipolar 1 disorder (HCC)    COPD (chronic obstructive pulmonary disease) (HCC)    Depression    Dyspnea    Pneumonia    PTSD (post-traumatic stress disorder)    Past Surgical History:  Procedure Laterality Date   bone spurs foot Right    CATARACT EXTRACTION Bilateral    COLONOSCOPY     COLONOSCOPY  07/2022   HAND SURGERY Left    JOINT REPLACEMENT     TONSILLECTOMY     TONSILLECTOMY AND ADENOIDECTOMY     TOTAL HIP ARTHROPLASTY Left 03/19/2019   Procedure: LEFT TOTAL HIP ARTHROPLASTY ANTERIOR APPROACH;  Surgeon: Vernetta Lonni GRADE, MD;  Location: [facility];  Service: Orthopedics;  Laterality: Left;   TOTAL HIP ARTHROPLASTY Right 06/04/2019   Procedure: RIGHT TOTAL HIP ARTHROPLASTY ANTERIOR APPROACH;  Surgeon: Vernetta Lonni GRADE, MD;  Location: [facility];  Service: Orthopedics;  Laterality: Right;   TOTAL KNEE ARTHROPLASTY Left 12/28/2021   Procedure: Left TOTAL KNEE ARTHROPLASTY;  Surgeon: Vernetta Lonni GRADE, MD;  Location: [facility];  Service: Orthopedics;  Laterality: Left;   Patient Active Problem List   Diagnosis Date Noted   Coronary artery disease involving native coronary artery of native heart without angina pectoris 03/08/2024   Aortic atherosclerosis (HCC) 03/08/2024   Unilateral primary osteoarthritis, right knee 12/31/2023   Hyperlipidemia 07/04/2023   Diabetes mellitus without complication (HCC) 08/09/2022   Restless legs syndrome 08/09/2022   Status post total left knee replacement 12/28/2021   Primary osteoarthritis of left knee 10/25/2021   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  Rationale for Evaluation and Treatment: Rehabilitation  ONSET DATE: Months ago  SUBJECTIVE:   SUBJECTIVE STATEMENT: *** Fell on Monday, had to call EMS to help get her up.  Thinks she was out for a few minutes.  Went to a chiropractor Friday, and felt great.  Then got in an accident leaving the office.    PERTINENT HISTORY: History of Present Illness: Judy [name] is a 73 y.o. female with ankylosing spondylitis, osteoarthritis and degenerative disc disease.  She had been receiving right knee joint viscosupplement injections at the knee pain center.  She has noticed improvement in her right knee joint pain.  Her left knee joint had replacement in 2023 which continues to hurt.  She states she has been noticing increased discomfort and swelling in her left knee and left foot.  She believes her left arm is swollen.  She  had taken Cimzia  in the past and did not notice any difference on Cimzia .  She continues to have stiffness in her neck and back.  She states she was seen at the pain clinic and had facet joint injection in her lumbar spine 3 weeks ago which was helpful.  She is followed by Dr. Theophilus for COPD.  She is followed by cardiology for coronary artery disease.   PMH: COPD, left TKA, Rt THA, left THA, hand surgery, Bipolar, depression, avascular necrosis,   PAIN:  NPRS scale: see above Pain  description: achy Aggravating factors: certain days are worse than other Relieving factors: over the counter meds, muscle relaxer, ice, nothing helps fully  PRECAUTIONS: Other: COPD  WEIGHT BEARING RESTRICTIONS: No  FALLS:  Has patient fallen in last 6 months? No  LIVING ENVIRONMENT: Lives with: lives with their family and lives alone Lives in: House/apartment Stairs: Yes: External: 4 steps; can reach both Has following equipment at home: Single point cane, shower chair, raised toilet seat  OCCUPATION: retired  PLOF: Independent  PATIENT GOALS: walk better and longer distances    OBJECTIVE:   DIAGNOSTIC FINDINGS:  10/28/2022:  1. Increased T2 signal in the T12-L1 disc, with some loss of cortical signal in the T12-L1 endplates. While this could be degenerative in nature, early discitis/osteomyelitis could have a similar appearance. Recommend correlation with inflammatory markers. 2. L3-L4 mild-to-moderate spinal canal stenosis and mild left neural foraminal narrowing, which is new from the prior exam. Narrowing of the lateral recesses at this level could affect the descending L4 nerve roots. 3. L4-L5 narrowing of the left lateral recess, which could affect the descending left L5 nerve. 4. L5-S1 large left lateral osteophyte, which may abut the exiting left L5 nerve. 5. Atrophy of the right psoas muscle, which is new from the prior exam.  PATIENT SURVEYS:  Patient-Specific Activity Scoring Scheme  0 represents "unable to perform." 10 represents "able to perform at prior level. 0 1 2 3 4 5 6 7 8 9  10 (Date and Score)   Activity Eval  04/06/24 05/10/24   1. Walk without cane 3   8  2. Shower without sitting  1  1  3. Shop without electric cart 3 3  4.minor cleaning tasks 3 9  5.standing >/= 10 minutes 3 3  Score 4.6 4.8   Total score = sum of the activity scores/number of activities Minimum detectable change (90%CI) for average score = 2 points Minimum  detectable change (90%CI) for single activity score = 3 points  COGNITION: Overall cognitive status: WFL    SENSATION: WFL  POSTURE:  rounded shoulders, forward head, and decreased lumbar lordosis  PALPATION: 04/06/24: TTP Rt lumbar paraspinals and QL, lateral Rt hip and joint line of Rt knee.   LOWER EXTREMITY ROM:   ROM Right 04/06/24 Left eval  Hip flexion 90 c pain In low back and Rt hip 98 c pain in low back  Hip extension    Hip abduction    Hip adduction    Hip internal rotation    Hip external rotation    Knee flexion 125 130  Knee extension -5 -2  Ankle dorsiflexion    Ankle plantarflexion    Ankle inversion    Ankle eversion     (Blank rows = not tested)  LOWER EXTREMITY MMT:  MMT Right Eval 04/06/24 Left eval  Hip flexion 15.2 lbs 18.9 lbs  Hip extension    Hip abduction    Hip adduction  Hip internal rotation    Hip external rotation    Knee flexion 24.9 lbs 21.0 lbs  Knee extension 33.2 lbs 43.5 lbs  Ankle dorsiflexion    Ankle plantarflexion    Ankle inversion    Ankle eversion     (Blank rows = not tested)    FUNCTIONAL TESTS:  04/06/24: 5 time sit to stand: 25.71 seconds c UE support  04/29/24: BERG 50/56   GAIT: Distance walked: clinic distances  Assistive device utilized: Single point cane Level of assistance: Modified independence Comments: antalgic gait with mild trendelenburg                                                                                                                                                                        TODAY'S TREATMENT 06/16/24 ***   05/19/24 TherAct Leg Press bil 100# 3x10 Forward step ups onto 4 step 2x10 bil; bil UE support Calf raises 2x10 with bil UE support Standing hip abduction x10 reps bil; L2 band Standing hip extension x 10 reps bil; L2 band  TherEx Single knee to chest 3x30 sec bil Supine piriformis stretch 3x30 sec bil  Therapeutic rest breaks needed throughout  session due to fatigue   05/10/24 TherEx NuStep L4 x  10 min Supine piriformis stretch 3x30 sec bil Hooklying single limb clamshell 2x10 bil; L3 band Supine isometric abdominal contraction pushing bil UE's into red physio ball x 10  TherAct Bridges 2 x 10 c pursed lip breathing to prevent pt from holding her breathe Sit to stand x 10  Log rolling bil sides  Modalities:  Moist heat x 5 min to low back and bil knees at end of session with bil LE elevated on bolster     05/05/24 TherEx NuStep L4 x 8 min Supine piriformis stretch 3x30 sec bil Hooklying single limb clamshell 2x10 bil; L3 band  TherAct Bridges 2x10 with L3 band isometric hold Sit to/from stand 2x5 reps  Moist heat x 10 min to neck and low back after session     04/29/24 Physical Performace Test BERG balance test - see above for details  TherEx Single knee to chest 3x30 sec bil  Supine piriformis stretch 3x30 sec bil Hooklying single limb clamshell 2x10 bil; L3 band Supine SAQ 2x10 bil; 3#   TherAct Sit to/from stand x 10 reps        PATIENT EDUCATION:  Education details: HEP, POC Person educated: Patient Education method: Programmer, multimedia, Facilities manager, Verbal cues, and Handouts Education comprehension: verbalized understanding, returned demonstration, and verbal cues required  HOME EXERCISE PROGRAM: Access Code: [email or web address] URL: [email or web address] Date: 04/06/2024 Prepared by: Delon Lunger  Exercises - Seated Long Arc Quad  - 1-2 x daily - 7 x weekly -  10 reps - Seated Small Alternating Straight Leg Lifts with Heel Touch  - 1-2 x daily - 7 x weekly - 10 reps - Supine Knee Extension Strengthening  - 1-2 x daily - 7 x weekly - 10 reps - 3 seconds hold  ASSESSMENT:  CLINICAL IMPRESSION: *** Pt continues to report increased fatigue and weakness but does feel she is stronger and pain has decreased since starting PT.  Will continue to benefit from PT to maximize  function.   OBJECTIVE IMPAIRMENTS: decreased balance, decreased mobility, difficulty walking, decreased ROM, decreased strength, impaired flexibility, and pain.   ACTIVITY LIMITATIONS: lifting, bending, sitting, standing, squatting, sleeping, and stairs  PARTICIPATION LIMITATIONS: meal prep, cleaning, laundry, and community activity  PERSONAL FACTORS: 3+ comorbidities: see PMH above are also affecting patient's functional outcome.   REHAB POTENTIAL: Fair  to good, based on pt's significant co-morbidities and PMH see above  CLINICAL DECISION MAKING: Stable/uncomplicated  EVALUATION COMPLEXITY: Moderate   GOALS: Goals reviewed with patient? Yes  SHORT TERM GOALS: (target date for Short term goals are 3 weeks 04/27/2024)   1.  Patient will demonstrate independent use of home exercise program to maintain progress from in clinic treatments. Goal status: MET 04/29/24  LONG TERM GOALS: (target dates for all long term goals are 10 weeks  06/18/2024 )   1. Patient will demonstrate/report pain at worst less than or equal to 2/10 to facilitate minimal limitation in daily activity secondary to pain symptoms.  Goal status: New   2. Patient will demonstrate independent use of home exercise program to facilitate ability to maintain/progress functional gains from skilled physical therapy services.  Goal status: New   3. Patient will demonstrate Patient specific functional scale avg > or = 4.6 to indicate reduced disability due to condition.   Goal status: New   4.  Patient will demonstrate Rt  LE MMT by >/= 5 lbs from baseline throughout to faciltiate usual transfers, stairs, squatting at [facility] for daily life.   Goal status: New   5.  Patient will demonstrate up and down 4 steps with step to gait pattern using hand rail for support.  Goal status: New       PLAN:  PT FREQUENCY: 1-2x/week  PT DURATION: 10 weeks  PLANNED INTERVENTIONS: Can include 02853- PT Re-evaluation,  97110-Therapeutic exercises, 97530- Therapeutic activity, [id number]- Neuromuscular re-education, 97535- Self Care, 97140- Manual therapy, [phone]- Gait training, [phone]- Orthotic Fit/training, [phone]- Canalith repositioning, [id number]- Aquatic Therapy, [phone]- Electrical stimulation (unattended), [id number] Physical performance testing, 97016- Vasopneumatic device, [id number]- Ultrasound, [id number]- Traction (mechanical), [id number]- Ionotophoresis 4mg /ml Dexamethasone ,  79439 - Needle insertion w/o injection 1 or 2 muscles, 20561 - Needle insertion w/o injection 3 or more muscles.   Patient/Family education, Balance training, Stair training, Taping, Dry Needling, Joint mobilization, Joint manipulation, Spinal manipulation, Spinal mobilization, Scar mobilization, Vestibular training, Visual/preceptual remediation/compensation, DME instructions, Cryotherapy, and Moist heat.  All performed as medically necessary.  All included unless contraindicated  PLAN FOR NEXT SESSION:  *** progress functional mobility, quad strengthening, core strengthening.   Next MD visit: 03/10/25  [name], PT, DPT 06/16/24 7:27 AM

## 2024-06-16 NOTE — Telephone Encounter (Signed)
 Copied from CRM [id number]. Topic: General - Other >> Jun 16, 2024  1:01 PM Robinson H wrote: Reason for CRM: Patient calling to find out if she needs an appointment to see Dr. Ozell for a referral, patient states she was supposed to have a procedure yesterday at the knee specialist and they weren't able to do it, states it was issues and they couldn't get the wire through and she was advised to see her primary care for referrals. Knee specialist told patient that she needs to see a Cardiologist or Vein specialist, patient states she already sees a Cardiologist so she's not what to do.  Teana [phone]

## 2024-06-17 NOTE — Telephone Encounter (Signed)
 Can she tell us  which provider she saw? I will need to get the notes from the provider in order to review it and findout what referral they are recommending.

## 2024-06-17 NOTE — Progress Notes (Signed)
 Checking her blood sugar would rule out drops in blood sugar, which could potentially be causing the episodes.

## 2024-06-17 NOTE — Telephone Encounter (Signed)
 Left a message for the patient to return my call.

## 2024-06-21 ENCOUNTER — Encounter: Admitting: Physical Therapy

## 2024-06-23 ENCOUNTER — Telehealth: Payer: Self-pay

## 2024-06-23 ENCOUNTER — Encounter: Admitting: Physical Therapy

## 2024-06-23 NOTE — Telephone Encounter (Signed)
 Copied from CRM [id number]. Topic: General - Other >> Jun 16, 2024  1:01 PM Robinson H wrote: Reason for CRM: Patient calling to find out if she needs an appointment to see Dr. Ozell for a referral, patient states she was supposed to have a procedure yesterday at the knee specialist and they weren't able to do it, states it was issues and they couldn't get the wire through and she was advised to see her primary care for referrals. Knee specialist told patient that she needs to see a Cardiologist or Vein specialist, patient states she already sees a Cardiologist so she's not what to do.  Elinora [phone] >> Jun 23, 2024  1:49 PM Robinson H wrote: Patient called stating no one has reached out patient is upset and she's in pain from the issue and not sure what to do since she needs the referral, reached out to [facility] and was advised someone will call patient back. Patient states the knee specialist name was Dr. Cleopatra and he recommended she see the vein specialist or cardiologist.   Dorothyann [phone]

## 2024-06-24 ENCOUNTER — Other Ambulatory Visit: Payer: Self-pay | Admitting: Family Medicine

## 2024-06-24 ENCOUNTER — Ambulatory Visit: Payer: Self-pay

## 2024-06-24 NOTE — Telephone Encounter (Signed)
 FYI Only or Action Required?: Action required by provider: referral request, clinical question for provider, and update on patient condition.  Patient was last seen in primary care on 06/03/2024 by Ozell Heron HERO, MD.  Called Nurse Triage reporting Pain and Epistaxis.  Symptoms began several months ago.  Interventions attempted: Rest, hydration, or home remedies.  Symptoms are: unchanged.  Triage Disposition: See HCP Within 4 Hours (Or PCP Triage)  Patient/caregiver understands and will follow disposition?: No, wishes to speak with PCP   Copied from CRM #[id number]. Topic: Clinical - Red Word Triage >> Jun 24, 2024  4:30 PM Avram MATSU wrote: Red Word that prompted transfer to Nurse Triage:pt had a bloody nose and stated she coughed up blood, it was thick. Pt is also in pain. Reason for Disposition  [1] Mild-moderate nosebleed AND [2] bleeding stopped now  [1] SEVERE pain (e.g., excruciating, unable to walk) AND [2] not improved after 2 hours of pain medicine  Answer Assessment - Initial Assessment Questions Additional info:  1) Requesting medication for knee pain. She reports Dr. Johnny prescribed few days of Norco which was very helpful, she would like to know if pcp would be willing to refill another script.  2) She is awaiting referrals for vascular. She would like to know when this will be submitted. Was to have procedure last week but was told her veins had to much plaque for procedure and follow up with pcp for referral to vein specialist 3) Offered next available appointment with PCP but she declines this offer I have been to so many doctors in the past 6 weeks and nobody knows what is wrong with me and just keeps sending me back, I would like referral today so I can call before the weekend  1. LOCATION and RADIATION: Where is the pain located?      Right Knee  2. QUALITY: What does the pain feel like?  (e.g., sharp, dull, aching, burning)     Sharp pain  3. SEVERITY:  How bad is the pain? What does it keep you from doing?   (Scale 1-10; or mild, moderate, severe)     severe 4. ONSET: When did the pain start? Does it come and go, or is it there all the time?     ongoing 5. RECURRENT: Have you had this pain before? If Yes, ask: When, and what happened then?     yes 6. SETTING: Has there been any recent work, exercise or other activity that involved that part of the body?      No 7. AGGRAVATING FACTORS: What makes the knee pain worse? (e.g., walking, climbing stairs, running)      8. ASSOCIATED SYMPTOMS: Is there any swelling or redness of the knee?     no 9. OTHER SYMPTOMS: Do you have any other symptoms? (e.g., calf pain, chest pain, difficulty breathing, fever)     Nosebleed this morning  Answer Assessment - Initial Assessment Questions 1. AMOUNT OF BLEEDING: How bad is the bleeding? How much blood was lost? Has the bleeding stopped?     Unsure-thick blood 2. ONSET: When did the nosebleed start?      This morning 3. FREQUENCY: How many nosebleeds have you had in the last 24 hours?      one 4. RECURRENT SYMPTOMS: Have there been other recent nosebleeds? If Yes, ask: How long did it take you to stop the bleeding? What worked best?      no 5. CAUSE: What do you think  caused this nosebleed?     unknown 6. LOCAL FACTORS: Do you have any cold symptoms?, Have you been rubbing or picking at your nose?     no 7. SYSTEMIC FACTORS: Do you have high blood pressure or any bleeding problems?     Not at this time 8. BLOOD THINNERS: Do you take any blood thinners? (e.g., aspirin , clopidogrel / Plavix, coumadin, heparin). Notes: Other strong blood thinners include: Arixtra (fondaparinux), Eliquis (apixaban), Pradaxa (dabigatran), and Xarelto (rivaroxaban).      9. OTHER SYMPTOMS: Do you have any other symptoms? (e.g., lightheadedness)     Knee pain 10. PREGNANCY: Is there any chance you are pregnant? When was  your last menstrual period?  Protocols used: Nosebleed-A-AH, Knee Pain-A-AH

## 2024-06-25 NOTE — Telephone Encounter (Signed)
 Please let patient know that it is no longer recommended to treat chronic pain with opioid medication due to the high risk nature of these medications. Is she already seeing the pain management physician? Or othopedics?

## 2024-06-25 NOTE — Telephone Encounter (Signed)
 Left a message for the patient to return my call.

## 2024-06-26 DIAGNOSIS — R55 Syncope and collapse: Secondary | ICD-10-CM | POA: Diagnosis not present

## 2024-06-28 NOTE — Progress Notes (Signed)
 Yes she probably needs to go for the runs of SVT

## 2024-06-28 NOTE — Progress Notes (Signed)
 Reviewed the monitor results.  Episodes of SVT and VT are nonsustained.  Recommend echocardiogram and starting metoprolol  to tartrate 25 mg twice daily and nonurgent follow-up after the tests, sometime in October or November.  Thanks MJP

## 2024-06-29 MED ORDER — METOPROLOL TARTRATE 25 MG PO TABS: 25.0000 mg | ORAL_TABLET | Freq: Two times a day (BID) | ORAL | 0 refills | Status: DC

## 2024-06-29 NOTE — Addendum Note (Signed)
 Addended by: ELNER NANNY B on: 06/29/2024 03:03 PM   Modules accepted: Orders

## 2024-06-29 NOTE — Progress Notes (Signed)
 Noted.  Thanks MJP

## 2024-06-29 NOTE — Progress Notes (Signed)
 Spoke with patient and appointment made for 08/04/24 with Dr. Elmira.

## 2024-06-30 ENCOUNTER — Telehealth: Payer: Self-pay | Admitting: *Deleted

## 2024-06-30 NOTE — Telephone Encounter (Signed)
 Patient informed of the message below.  Message sent to PCP as patient stated she has an appt with orthopedics on 11/12.

## 2024-06-30 NOTE — Telephone Encounter (Signed)
 See prior phone note.

## 2024-06-30 NOTE — Telephone Encounter (Signed)
 Copied from CRM #[id number]. Topic: Clinical - Medication Question >> Jun 30, 2024 10:53 AM Alfonso HERO wrote: Reason for CRM: patient is calling about receiving a prescription for blood pressure meds (Metoprolol ) sent over to the pharmacy that she didn't think she needed. Also, wants to follow up on a pain med prescription she's been requesting for Hydrocodone  from Dr. Johnny.

## 2024-06-30 NOTE — Telephone Encounter (Signed)
 See prior phone note in which patient was advised Rx not recommended for chronic pain.  Patient stated she looked up the medication and saw this is for high blood pressure but her pressure was low and she does not know why this would be given.  Patient was advised Metoprolol  was sent in by [facility] based on test results and even if a patient googles a medication, if it stated it is for high blood pressure or other problems, the provider will determine what and if this is needed based on test results.  Patient was advised to contact the cardiology office for recommendations.

## 2024-07-01 NOTE — Telephone Encounter (Signed)
 The metoprolol  is to help lower heart rate/ reduce the palpitations, but yes it can potentially lower blood pressure as well. If she is worried about taking the medication she can wait until she sees the cardiologist on 10/29.

## 2024-07-01 NOTE — Telephone Encounter (Signed)
 If it is just her knee that is hurting we can try to give her an injection in the knee here in the office. I'm not sure what sort of procedure she was going to have but usually plaque build up is in the arteries not the veins. Which specialist was going to be performing the procedure?

## 2024-07-02 NOTE — Telephone Encounter (Signed)
 Left message for the patient to return my call.

## 2024-07-02 NOTE — Telephone Encounter (Signed)
 Left a message for the patient to return my call.

## 2024-07-05 ENCOUNTER — Telehealth: Payer: Self-pay | Admitting: Physician Assistant

## 2024-07-05 NOTE — Telephone Encounter (Signed)
 Patient called. Says her R knee is really bother her. Would like Tory to tell her what she can do. What options she has? Her cb# [phone]

## 2024-07-08 ENCOUNTER — Telehealth: Payer: Self-pay | Admitting: Physician Assistant

## 2024-07-08 NOTE — Telephone Encounter (Signed)
 Patient called and said she would like for Tory to call her. She wants to discuss surgery. [phone]

## 2024-07-12 NOTE — Telephone Encounter (Signed)
 called

## 2024-07-14 ENCOUNTER — Telehealth: Payer: Self-pay | Admitting: Physical Therapy

## 2024-07-20 ENCOUNTER — Telehealth: Payer: Self-pay

## 2024-07-20 NOTE — Telephone Encounter (Signed)
   Pre-operative Risk Assessment    Patient Name: Judy [name]  DOB: 06/17/1951 MRN: [id number]   Date of last office visit: 03/08/24 NEWMAN LAWRENCE, MD Date of next office visit: 08/04/24 [facility], MD   Request for Surgical Clearance    Procedure:  RIGHT TOTAL KNEE ARTHROPLASTY  Date of Surgery:  Clearance TBD                                Surgeon:  DR RANKIN WHITE Surgeon's Group or Practice Name:  [facility] Phone number:  [phone] Fax number:  [phone]  ATTN: KATHERYN STONE   Type of Clearance Requested:   - Medical  - Pharmacy:  Hold Aspirin      Type of Anesthesia:  POPLITEAL NERVE BLOCK   Additional requests/questions:    SignedLucie DELENA Ku   07/20/2024, 5:11 PM

## 2024-07-21 NOTE — Telephone Encounter (Signed)
   Name: Judy [name]  DOB: 31-Jul-1951  MRN: [id number]  Primary Cardiologist: Newman JINNY Jamyah Folk, MD  Chart reviewed as part of pre-operative protocol coverage. The patient has an upcoming visit scheduled with Dr. Gurinder Toral on 08/04/2024 at which time clearance can be addressed in case there are any issues that would impact surgical recommendations.  RIGHT TOTAL KNEE ARTHROPLASTY Is not scheduled until TBD as below. I added preop FYI to appointment note so that provider is aware to address at time of outpatient visit.  Per office protocol the cardiology provider should forward their finalized clearance decision and recommendations regarding antiplatelet therapy to the requesting party below.    I will route this message as FYI to requesting party and remove this message from the preop box as separate preop APP input not needed at this time.   Please call with any questions.  Lamarr Satterfield, NP  07/21/2024, 8:38 AM

## 2024-07-22 ENCOUNTER — Telehealth: Payer: Self-pay | Admitting: *Deleted

## 2024-07-22 ENCOUNTER — Ambulatory Visit: Admitting: Physician Assistant

## 2024-07-22 NOTE — Telephone Encounter (Signed)
 PCP completed the clearance, this was faxed to [facility] at [phone] and sent to be scanned.

## 2024-07-22 NOTE — Telephone Encounter (Signed)
 Copied from CRM [id number]. Topic: Clinical - Medical Advice >> Jul 20, 2024 12:19 PM Judy [name] wrote: Reason for CRM: Pt wanted to inform Judy [name] that Dareen is sending over surgery clearance forms for her R knee replacement. She also wants to know if Judy [name] needs to see her prior to surgery. Pls call pt.

## 2024-07-26 ENCOUNTER — Other Ambulatory Visit: Payer: Self-pay | Admitting: Family Medicine

## 2024-07-26 DIAGNOSIS — E119 Type 2 diabetes mellitus without complications: Secondary | ICD-10-CM

## 2024-07-29 ENCOUNTER — Ambulatory Visit (INDEPENDENT_AMBULATORY_CARE_PROVIDER_SITE_OTHER)

## 2024-07-29 DIAGNOSIS — R55 Syncope and collapse: Secondary | ICD-10-CM | POA: Diagnosis not present

## 2024-07-29 LAB — ECHOCARDIOGRAM COMPLETE
Area-P 1/2: 3.72 cm2
S' Lateral: 2.29 cm

## 2024-08-04 ENCOUNTER — Encounter: Payer: Self-pay | Admitting: Cardiology

## 2024-08-04 ENCOUNTER — Ambulatory Visit: Attending: Cardiology | Admitting: Cardiology

## 2024-08-04 VITALS — BP 102/64 | HR 83 | Ht 70.0 in | Wt 210.0 lb

## 2024-08-04 DIAGNOSIS — I700 Atherosclerosis of aorta: Secondary | ICD-10-CM

## 2024-08-04 DIAGNOSIS — I2510 Atherosclerotic heart disease of native coronary artery without angina pectoris: Secondary | ICD-10-CM

## 2024-08-04 DIAGNOSIS — E782 Mixed hyperlipidemia: Secondary | ICD-10-CM | POA: Diagnosis not present

## 2024-08-04 DIAGNOSIS — I739 Peripheral vascular disease, unspecified: Secondary | ICD-10-CM | POA: Diagnosis not present

## 2024-08-04 HISTORY — DX: Atherosclerosis of aorta: I70.0

## 2024-08-04 HISTORY — DX: Atherosclerotic heart disease of native coronary artery without angina pectoris: I25.10

## 2024-08-04 NOTE — Progress Notes (Signed)
 Cardiology Office Note:  .   Date:  08/04/2024  ID:  Judy [name], DOB 1951/03/07, MRN [id number] PCP: Ozell Heron HERO, MD  [facility] Providers Cardiologist:  Newman Lawrence, MD PCP: Ozell Heron HERO, MD  Chief Complaint  Patient presents with   Leg Pain   Pre-op Exam          Judy [name] is a 73 y.o. female with mixed hyperlipidemia, CAD, aortic atherosclerosis, bipolar disorder, former smoker  History of Present Illness  I last saw the patient in 03/2024.  She has not had any chest pain or shortness of breath symptoms since then.  She admits to not taking aspirin  and statin.  Recently, she has been troubled by knee pain and is hoping to undergo knee replacement soon with [facility].  It appears that she had an attempted procedure for her knee pain.  As per description, the interventional radiologist could not get the wires into her arteries.  I do not have the details of this procedure, but I suspect this could have been an attempted genicular artery embolization procedure.  She does report pain in her legs, but is hard to distinguish between knee pain versus calf pain.  Physical activity is limited currently due to knee pain.  Vitals:   08/04/24 1510  BP: 102/64  Pulse: 83  SpO2: 93%       Review of Systems  Cardiovascular:  Negative for chest pain, dyspnea on exertion, leg swelling, palpitations and syncope.       Right leg pain  Musculoskeletal:  Positive for joint pain.        Studies Reviewed: SABRA        EKG 08/04/2024: Normal sinus rhythm Normal ECG When compared with ECG of 08-Mar-2024 12:57, No significant change was found       Independently interpreted 11/2023: Cr 1.22, eGFR 44  06/2023: Chol 129, TG 119, HDL 44, LDL 61  01/2023: Hb 12.1  CT chest 09/2023: 1. Improving nodularity compared to the prior study, currently categorized as Lung-RADS 3S, probably benign findings. Short-term follow-up in 6 months is  recommended with repeat low-dose chest CT without contrast (please use the following order, CT CHEST LCS NODULE FOLLOW-UP W/O CM). 2. The S modifier above refers to potentially clinically significant non lung cancer related findings. Specifically, there is aortic atherosclerosis, in addition to left main and 2 vessel coronary artery disease. Please note that although the presence of coronary artery calcium  documents the presence of coronary artery disease, the severity of this disease and any potential stenosis cannot be assessed on this non-gated CT examination. Assessment for potential risk factor modification, dietary therapy or pharmacologic therapy may be warranted, if clinically indicated. 3. Mild diffuse bronchial wall thickening with mild centrilobular and paraseptal emphysema; imaging findings suggestive of underlying COPD. 4. 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 Outpatient referral to Pulmonology for further clinical evaluation should be considered.   Aortic Atherosclerosis (ICD10-I70.0) and Emphysema (ICD10-J43.9).    Echocardiogram 02/2023:  1. Left ventricular ejection fraction, by estimation, is 55 to 60%. The  left ventricle has normal function. The left ventricle has no regional  wall motion abnormalities. There is mild left ventricular hypertrophy.  Left ventricular diastolic parameters  are indeterminate.   2. Right ventricular systolic function is normal. The right ventricular  size is normal.   3. The mitral valve is normal in  structure. Mild mitral valve  regurgitation. No evidence of mitral stenosis.   4. The aortic valve is normal in structure. Aortic valve regurgitation is  not visualized. No aortic stenosis is present.   5. The inferior vena cava is normal in size with greater than 50%  respiratory  variability, suggesting right atrial pressure of 3 mmHg.   Stress test 02/2023:   LV perfusion is normal. There is no evidence of ischemia. There is no evidence of infarction.   Left ventricular function is normal. Nuclear stress EF: 67 %. The left ventricular ejection fraction is hyperdynamic (>65%). End diastolic cavity size is normal.   The study is normal. The study is low risk.     Physical Exam Vitals and nursing note reviewed.  Constitutional:      General: She is not in acute distress. Neck:     Vascular: No JVD.  Cardiovascular:     Rate and Rhythm: Normal rate and regular rhythm.     Pulses:          Dorsalis pedis pulses are 1+ on the right side and 1+ on the left side.       Posterior tibial pulses are 0 on the right side and 1+ on the left side.     Heart sounds: Normal heart sounds. No murmur heard. Pulmonary:     Effort: Pulmonary effort is normal.     Breath sounds: Normal breath sounds. No wheezing or rales.  Musculoskeletal:     Right lower leg: No edema.     Left lower leg: No edema.      VISIT DIAGNOSES:   ICD-10-CM   1. Coronary artery disease involving native coronary artery of native heart without angina pectoris  I25.10 EKG 12-Lead    2. Mixed hyperlipidemia  E78.2 EKG 12-Lead    Lipid panel    3. Claudication  I73.9 VAS US  ABI WITH/WO TBI    VAS US  LOWER EXTREMITY ARTERIAL DUPLEX    Ambulatory referral to Vascular Surgery        Judy [name] is a 73 y.o. female with mixed hyperlipidemia, CAD, aortic atherosclerosis, bipolar disorder, former smoker Assessment & Plan  CAD, aortic atherosclerosis: Reassuring echocardiogram and stress test in 02/2023. Continue aggressive risk factor modification. Low perioperative cardiac risk for upcoming knee surgery. Emphasize importance of taking aspirin  and Lipitor regularly.  Right leg pain: She may have 2 separate problems of arthritis, as well as PAD.  Distal pulses are reduced.  It appears  that she had an unsuccessful attempt of genicular artery embolization.  Recommend checking lower extremity arterial duplex and referral to vascular surgery. Continue aspirin , statin.  Unable to use losartan due to low normal blood pressures.  Ambulation would help her symptoms, but is currently limited due to knee pain  F/u in 6 months  Signed, Newman JINNY Lawrence, MD

## 2024-08-04 NOTE — Patient Instructions (Addendum)
 Medication Instructions:  Pt agrees that she will start back on aspirin  81 mg and atorvastatin  80 mg daily.   *If you need a refill on your cardiac medications before your next appointment, please call your pharmacy*  Lab Work: Lipid panel in 10/2024  If you have labs (blood work) drawn today and your tests are completely normal, you will receive your results only by: MyChart Message (if you have MyChart) OR A paper copy in the mail If you have any lab test that is abnormal or we need to change your treatment, we will call you to review the results.  Testing/Procedures: LEA Duplex and ABI  Your physician has requested that you have a lower or upper extremity arterial duplex. This test is an ultrasound of the arteries in the legs or arms. It looks at arterial blood flow in the legs and arms. Allow one hour for Lower and Upper Arterial scans. There are no restrictions or special instructions.  Please note: We ask at that you not bring children with you during ultrasound (echo/ vascular) testing. Due to room size and safety concerns, children are not allowed in the ultrasound rooms during exams. Our front office staff cannot provide observation of children in our lobby area while testing is being conducted. An adult accompanying a patient to their appointment will only be allowed in the ultrasound room at the discretion of the ultrasound technician under special circumstances. We apologize for any inconvenience.  REFERRAL TO VASCULAR SURGERY     Follow-Up: At [facility], you and your health needs are our priority.  As part of our continuing mission to provide you with exceptional heart care, our providers are all part of one team.  This team includes your primary Cardiologist (physician) and Advanced Practice Providers or APPs (Physician Assistants and Nurse Practitioners) who all work together to provide you with the care you need, when you need it.  Your next appointment:    11/2024   Provider:   Newman JINNY Lawrence, MD    We recommend signing up for the patient portal called MyChart.  Sign up information is provided on this After Visit Summary.  MyChart is used to connect with patients for Virtual Visits (Telemedicine).  Patients are able to view lab/test results, encounter notes, upcoming appointments, etc.  Non-urgent messages can be sent to your provider as well.   To learn more about what you can do with MyChart, go to [email or web address].

## 2024-08-06 ENCOUNTER — Telehealth: Payer: Self-pay | Admitting: Cardiology

## 2024-08-06 NOTE — Telephone Encounter (Signed)
 Will forward to preop APP to review all notes from MD appt are in for preop

## 2024-08-06 NOTE — Telephone Encounter (Signed)
 Patient called to follow-up on the status of her clearance and wants clearance faxed to [phone], Attention:  Katheryn in Surgical Scheduling.

## 2024-08-09 ENCOUNTER — Encounter: Payer: Self-pay | Admitting: Radiology

## 2024-08-09 NOTE — Telephone Encounter (Signed)
 Parents notes have been faxed with confirmation received.

## 2024-08-13 ENCOUNTER — Encounter (HOSPITAL_COMMUNITY): Payer: Self-pay

## 2024-08-13 ENCOUNTER — Ambulatory Visit (HOSPITAL_COMMUNITY)
Admission: RE | Admit: 2024-08-13 | Discharge: 2024-08-13 | Disposition: A | Discharge status: Home / Self Care | Source: Ambulatory Visit | Attending: Cardiology | Admitting: Cardiology

## 2024-08-13 ENCOUNTER — Ambulatory Visit (HOSPITAL_BASED_OUTPATIENT_CLINIC_OR_DEPARTMENT_OTHER)
Admission: RE | Admit: 2024-08-13 | Discharge: 2024-08-13 | Disposition: A | Discharge status: Home / Self Care | Source: Ambulatory Visit | Attending: Cardiology | Admitting: Cardiology

## 2024-08-13 DIAGNOSIS — I739 Peripheral vascular disease, unspecified: Secondary | ICD-10-CM | POA: Insufficient documentation

## 2024-08-13 LAB — VAS US ABI WITH/WO TBI
Left ABI: 1.38
Right ABI: 1.22

## 2024-08-16 ENCOUNTER — Ambulatory Visit: Payer: Self-pay | Admitting: Cardiology

## 2024-08-19 NOTE — Progress Notes (Deleted)
 Patient ID: Judy [name], female   DOB: 1951-01-22, 73 y.o.   MRN: [id number]  Reason for Consult: No chief complaint on file.   Referred by Ozell Heron HERO, MD  Subjective:     HPI Judy [name] is a 73 y.o. female presenting for evaluation of leg pain. *** She is a former smoker she quit in 2021.  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   Bipolar 1 disorder (HCC)    COPD (chronic obstructive pulmonary disease) (HCC)    Depression    Dyspnea    Pneumonia    PTSD (post-traumatic stress disorder)    Family History  Problem Relation Age of Onset   CAD Mother 50   COPD Mother    Depression Mother    Heart attack Father    Colon polyps Sister    Cancer Maternal Aunt        growth on face- underwent chemo   Heart attack Maternal Grandmother    Early death Maternal Grandfather    Hearing loss Paternal Grandfather    Asthma Daughter    Hypertension Daughter    Bipolar disorder Son    Past Surgical History:  Procedure Laterality Date   bone spurs foot Right    CATARACT EXTRACTION Bilateral    COLONOSCOPY     COLONOSCOPY  07/2022   HAND SURGERY Left    JOINT REPLACEMENT     TONSILLECTOMY     TONSILLECTOMY AND ADENOIDECTOMY     TOTAL HIP ARTHROPLASTY Left 03/19/2019   Procedure: LEFT TOTAL HIP ARTHROPLASTY ANTERIOR APPROACH;  Surgeon: Vernetta Lonni GRADE, MD;  Location: [facility];  Service: Orthopedics;  Laterality: Left;   TOTAL HIP ARTHROPLASTY Right 06/04/2019   Procedure: RIGHT TOTAL HIP ARTHROPLASTY ANTERIOR APPROACH;  Surgeon: Vernetta Lonni GRADE, MD;  Location: [facility];  Service: Orthopedics;  Laterality: Right;   TOTAL KNEE ARTHROPLASTY Left 12/28/2021   Procedure: Left TOTAL KNEE ARTHROPLASTY;  Surgeon: Vernetta Lonni GRADE, MD;  Location: [facility];  Service: Orthopedics;  Laterality: Left;    Short Social History:  Social History   Tobacco Use   Smoking status: Former    Current packs/day: 0.00    Average packs/day: 1.3 packs/day for 43.0 years (53.8 ttl pk-yrs)    Types: Cigarettes    Start date: 05/19/1977    Quit date: 05/19/2020    Years since quitting: 4.2    Passive exposure: Never   Smokeless tobacco: Never  Substance Use Topics   Alcohol use: Yes    Comment: occ    Allergies  Allergen Reactions   Celexa [Citalopram Hydrobromide]     goes weird   Gabapentin      goes weird  Other reaction(s): hall    Current Outpatient Medications  Medication Sig Dispense Refill   albuterol  (VENTOLIN  HFA) 108 (90 Base) MCG/ACT inhaler Inhale 1-2 puffs into the lungs every 6 (six) hours as needed for wheezing or shortness of breath.     atorvastatin  (LIPITOR) 80 MG tablet Take 1 tablet (80 mg total) by mouth daily. (Patient taking differently: Take 80 mg by mouth every evening.) 90 tablet 1   buPROPion (WELLBUTRIN XL) 150 MG 24 hr tablet Take 150 mg by mouth in  the morning.     cyclobenzaprine  (FLEXERIL ) 10 MG tablet Take 0.5-1 tablets (5-10 mg total) by mouth 3 (three) times daily as needed for muscle spasms. 60 tablet 0   Dermatological Products, Misc. (KERASAL FUNGAL NAIL RENEWAL EX) Apply 1 Application topically in the morning and at bedtime.     Fluticasone -Umeclidin-Vilant (TRELEGY ELLIPTA ) 100-62.5-25 MCG/ACT AEPB INHALE 1 PUFF ONCE DAILY 180 each 1   metFORMIN  (GLUCOPHAGE ) 1000 MG tablet TAKE 1 TABLET BY MOUTH TWICE DAILY WITH A MEAL 180 tablet 0   QUEtiapine  (SEROQUEL  XR) 400 MG 24 hr tablet Take 800 mg by mouth at bedtime.     tirzepatide  (MOUNJARO ) 5 MG/0.5ML Pen Inject 5 mg into the skin once a week. (Patient taking differently: Inject 5 mg into the skin every Sunday.) 6 mL 5   No current facility-administered medications for this visit.    REVIEW OF SYSTEMS  All other  systems were reviewed and are negative     Objective:  Objective   There were no vitals filed for this visit. There is no height or weight on file to calculate BMI.  Physical Exam General: no acute distress Cardiac: hemodynamically stable Abdomen: non-tender, no pulsatile mass*** Extremities: no edema, cyanosis or wounds*** Vascular:   Right: ***  Left: ***  Data: ABI +---------+------------------+-----+---------+--------+  Right   Rt Pressure (mmHg)IndexWaveform Comment   +---------+------------------+-----+---------+--------+  Brachial 94                                        +---------+------------------+-----+---------+--------+  ATA     102               1.09 triphasic          +---------+------------------+-----+---------+--------+  PTA     115               1.22 triphasic          +---------+------------------+-----+---------+--------+  Great Toe93                0.99                    +---------+------------------+-----+---------+--------+   +---------+------------------+-----+---------+-------+  Left    Lt Pressure (mmHg)IndexWaveform Comment  +---------+------------------+-----+---------+-------+  Brachial 91                                       +---------+------------------+-----+---------+-------+  ATA     120               1.28 triphasic         +---------+------------------+-----+---------+-------+  PTA     13 0               1.38 triphasic         +---------+------------------+-----+---------+-------+  Great Toe96                1.02                   +---------+------------------+-----+---------+-------+   CMP reviewed, creatinine 1.0  Echo reviewed EF 60 to 65%, no RWMA, RV function normal, Aortic valve calcification but no regurg or stenosis.  Reviewed carotid duplex Negative for stenosis  Reviewed lumbar MRI from 2024  No evidence of aortic aneurysm      Assessment/Plan:    Judy [name] is a  73 y.o. female with bilateral knee pain.  I explained that her symptoms are most likely related to arthritis.  She has palpable distal pulses and normal ABI and toe pressures bilaterally. She does have CAD and therefore recommend to continue with medical management of aspirin  and Lipitor. She is okay to continue with orthopedics for osteoarthritis.   Regarding other vascular pathology.  She has had carotid screening in September 2025 which was negative for any stenosis and a lumbar MRI from January 2024 that did not demonstrate any aortic aneurysm. No need for further workup or surveillance from vascular perspective.  Norman GORMAN Serve MD [facility]

## 2024-08-20 ENCOUNTER — Ambulatory Visit: Admitting: Vascular Surgery

## 2024-08-23 NOTE — Progress Notes (Signed)
 Surgical Instructions   Your procedure is scheduled on Thursday, November 20th. Report to [facility] Main Entrance A at 9:30 A.M., then check in with the Admitting office. Any questions or running late day of surgery: call [phone]  Questions prior to your surgery date: call [phone], Monday-Friday, 8am-4pm. If you experience any cold or flu symptoms such as cough, fever, chills, shortness of breath, etc. between now and your scheduled surgery, please notify us  at the above number.     Remember:  Do not eat after midnight the night before your surgery  You may drink clear liquids until 8:30 the morning of your surgery.   Clear liquids allowed are: Water , Non-Citrus Juices (without pulp), Carbonated Beverages, Clear Tea (no milk, honey, etc.), Black Coffee Only (NO MILK, CREAM OR POWDERED CREAMER of any kind), and Gatorade.  Patient Instructions  The night before surgery:  No food after midnight. ONLY clear liquids after midnight   The day of surgery (if you have diabetes): Drink ONE (1) 12 oz G2 given to you in your pre admission testing appointment by 8:3 the morning of surgery. Drink in one sitting. Do not sip.  This drink was given to you during your hospital pre-op appointment visit.   Nothing else to drink after completing the 12 oz bottle of G2.         If you have questions, please contact your surgeon's office.     Take these medicines the morning of surgery with A SIP OF WATER   buPROPion (WELLBUTRIN XL)  Fluticasone -Umeclidin-Vilant (TRELEGY ELLIPTA )    May take these medicines IF NEEDED: albuterol  (VENTOLIN  HFA) inhaler - bring with you on day of surgery  cyclobenzaprine  (FLEXERIL )    One week prior to surgery, STOP taking any Aspirin  (unless otherwise instructed by your surgeon) Aleve, Naproxen, Ibuprofen , Motrin , Advil , Goody's, BC's, all herbal medications, fish oil, and non-prescription vitamins.   WHAT DO I DO ABOUT MY DIABETES MEDICATION?   Do  not take oral diabetes medicines metformin  (GLUCOPHAGE ) the morning of surgery.  Per your physician's instruction, HOLD your tirzepatide  (MOUNJARO ) as of today.   The day of surgery, do not take other diabetes injectables, including Byetta (exenatide), Bydureon (exenatide ER), Victoza (liraglutide), or Trulicity (dulaglutide).  HOW TO MANAGE YOUR DIABETES BEFORE AND AFTER SURGERY  Why is it important to control my blood sugar before and after surgery? Improving blood sugar levels before and after surgery helps healing and can limit problems. A way of improving blood sugar control is eating a healthy diet by:  Eating less sugar and carbohydrates  Increasing activity/exercise  Talking with your doctor about reaching your blood sugar goals High blood sugars (greater than 180 mg/dL) can raise your risk of infections and slow your recovery, so you will need to focus on controlling your diabetes during the weeks before surgery. Make sure that the doctor who takes care of your diabetes knows about your planned surgery including the date and location.  How do I manage my blood sugar before surgery? Check your blood sugar at least 4 times a day, starting 2 days before surgery, to make sure that the level is not too high or low.  Check your blood sugar the morning of your surgery when you wake up and every 2 hours until you get to the Short Stay unit.  If your blood sugar is less than 70 mg/dL, you will need to treat for low blood sugar: Do not take insulin . Treat a low blood sugar (less than  70 mg/dL) with  cup of clear juice (cranberry or apple), 4 glucose tablets, OR glucose gel. Recheck blood sugar in 15 minutes after treatment (to make sure it is greater than 70 mg/dL). If your blood sugar is not greater than 70 mg/dL on recheck, call [phone] for further instructions. Report your blood sugar to the short stay nurse when you get to Short Stay.  If you are admitted to the hospital after  surgery: Your blood sugar will be checked by the staff and you will probably be given insulin  after surgery (instead of oral diabetes medicines) to make sure you have good blood sugar levels. The goal for blood sugar control after surgery is 80-180 mg/dL.                 Do NOT Smoke (Tobacco/Vaping) for 24 hours prior to your procedure.  If you use a CPAP at night, you may bring your mask/headgear for your overnight stay.   You will be asked to remove any contacts, glasses, piercing's, hearing aid's, dentures/partials prior to surgery. Please bring cases for these items if needed.    Patients discharged the day of surgery will not be allowed to drive home, and someone needs to stay with them for 24 hours.  SURGICAL WAITING ROOM VISITATION Patients may have no more than 2 support people in the waiting area - these visitors may rotate.   Pre-op nurse will coordinate an appropriate time for 1 ADULT support person, who may not rotate, to accompany patient in pre-op.  Children under the age of 98 must have an adult with them who is not the patient and must remain in the main waiting area with an adult.  If the patient needs to stay at the hospital during part of their recovery, the visitor guidelines for inpatient rooms apply.  Please refer to the [facility] website for the visitor guidelines for any additional information.   If you received a COVID test during your pre-op visit  it is requested that you wear a mask when out in public, stay away from anyone that may not be feeling well and notify your surgeon if you develop symptoms. If you have been in contact with anyone that has tested positive in the last 10 days please notify you surgeon.      Pre-operative 4 CHG Bathing Instructions   You can play a key role in reducing the risk of infection after surgery. Your skin needs to be as free of germs as possible. You can reduce the number of germs on your skin by washing with CHG  (chlorhexidine  gluconate) soap before surgery. CHG is an antiseptic soap that kills germs and continues to kill germs even after washing.   DO NOT use if you have an allergy to chlorhexidine /CHG or antibacterial soaps. If your skin becomes reddened or irritated, stop using the CHG and notify one of our RNs at [phone].   Please shower with the CHG soap starting 4 days before surgery using the following schedule:     Please keep in mind the following:  DO NOT shave, including legs and underarms, starting the day of your first shower.   You may shave your face at any point before/day of surgery.  Place clean sheets on your bed the day you start using CHG soap. Use a clean washcloth (not used since being washed) for each shower. DO NOT sleep with pets once you start using the CHG.   CHG Shower Instructions:  [employer] your  face and private area with normal soap. If you choose to wash your hair, wash first with your normal shampoo.  After you use shampoo/soap, rinse your hair and body thoroughly to remove shampoo/soap residue.  Turn the water  OFF and apply  bottle of CHG soap to a CLEAN washcloth.  Apply CHG soap ONLY FROM YOUR NECK DOWN TO YOUR TOES (washing for 3-5 minutes)  DO NOT use CHG soap on face, private areas, open wounds, or sores.  Pay special attention to the area where your surgery is being performed.  If you are having back surgery, having someone wash your back for you may be helpful. Wait 2 minutes after CHG soap is applied, then you may rinse off the CHG soap.  Pat dry with a clean towel  Put on clean clothes/pajamas   If you choose to wear lotion, please use ONLY the CHG-compatible lotions that are listed below.  Additional instructions for the day of surgery:  If you choose, you may shower the morning of surgery with an antibacterial soap.  DO NOT APPLY any lotions, deodorants, or perfumes.   Do not bring valuables to the hospital. [facility] is not responsible for  any belongings/valuables. Do not wear nail polish, gel polish, artificial nails, or any other type of covering on natural nails (fingers and toes) Do not wear jewelry or makeup Put on clean/comfortable clothes.  Please brush your teeth.  Ask your nurse before applying any prescription medications to the skin.     CHG Compatible Lotions   Aveeno Moisturizing lotion  Cetaphil Moisturizing Cream  Cetaphil Moisturizing Lotion  Clairol Herbal Essence Moisturizing Lotion, Dry Skin  Clairol Herbal Essence Moisturizing Lotion, Extra Dry Skin  Clairol Herbal Essence Moisturizing Lotion, Normal Skin  Curel Age Defying Therapeutic Moisturizing Lotion with Alpha Hydroxy  Curel Extreme Care Body Lotion  Curel Soothing Hands Moisturizing Hand Lotion  Curel Therapeutic Moisturizing Cream, Fragrance-Free  Curel Therapeutic Moisturizing Lotion, Fragrance-Free  Curel Therapeutic Moisturizing Lotion, Original Formula  Eucerin Daily Replenishing Lotion  Eucerin Dry Skin Therapy Plus Alpha Hydroxy Crme  Eucerin Dry Skin Therapy Plus Alpha Hydroxy Lotion  Eucerin Original Crme  Eucerin Original Lotion  Eucerin Plus Crme Eucerin Plus Lotion  Eucerin TriLipid Replenishing Lotion  Keri Anti-Bacterial Hand Lotion  Keri Deep Conditioning Original Lotion Dry Skin Formula Softly Scented  Keri Deep Conditioning Original Lotion, Fragrance Free Sensitive Skin Formula  Keri Lotion Fast Absorbing Fragrance Free Sensitive Skin Formula  Keri Lotion Fast Absorbing Softly Scented Dry Skin Formula  Keri Original Lotion  Keri Skin Renewal Lotion Keri Silky Smooth Lotion  Keri Silky Smooth Sensitive Skin Lotion  Nivea Body Creamy Conditioning Oil  Nivea Body Extra Enriched Lotion  Nivea Body Original Lotion  Nivea Body Sheer Moisturizing Lotion Nivea Crme  Nivea Skin Firming Lotion  NutraDerm 30 Skin Lotion  NutraDerm Skin Lotion  NutraDerm Therapeutic Skin Cream  NutraDerm Therapeutic Skin Lotion   ProShield Protective Hand Cream  Provon moisturizing lotion  Please read over the following fact sheets that you were given.

## 2024-08-24 ENCOUNTER — Inpatient Hospital Stay (HOSPITAL_COMMUNITY)
Admission: RE | Admit: 2024-08-24 | Discharge: 2024-08-24 | Disposition: A | Discharge status: Home / Self Care | Source: Ambulatory Visit

## 2024-08-25 ENCOUNTER — Encounter (HOSPITAL_COMMUNITY): Payer: Self-pay

## 2024-08-25 ENCOUNTER — Other Ambulatory Visit: Payer: Self-pay

## 2024-08-25 ENCOUNTER — Encounter (HOSPITAL_COMMUNITY)
Admission: RE | Admit: 2024-08-25 | Discharge: 2024-08-25 | Disposition: A | Discharge status: Home / Self Care | Source: Ambulatory Visit

## 2024-08-25 VITALS — BP 130/64 | HR 80 | Temp 97.5°F | Resp 18 | Ht 70.0 in | Wt 201.0 lb

## 2024-08-25 DIAGNOSIS — Z01818 Encounter for other preprocedural examination: Secondary | ICD-10-CM | POA: Insufficient documentation

## 2024-08-25 DIAGNOSIS — R7303 Prediabetes: Secondary | ICD-10-CM | POA: Diagnosis not present

## 2024-08-25 HISTORY — DX: Dependence on other enabling machines and devices: Z99.89

## 2024-08-25 HISTORY — DX: Bronchitis, not specified as acute or chronic: J40

## 2024-08-25 HISTORY — DX: Prediabetes: R73.03

## 2024-08-25 LAB — BASIC METABOLIC PANEL WITH GFR
Anion gap: 12 (ref 5–15)
BUN: 13 mg/dL (ref 8–23)
CO2: 23 mmol/L (ref 22–32)
Calcium: 9.9 mg/dL (ref 8.9–10.3)
Chloride: 104 mmol/L (ref 98–111)
Creatinine, Ser: 1.12 mg/dL — ABNORMAL HIGH (ref 0.44–1.00)
GFR, Estimated: 52 mL/min — ABNORMAL LOW (ref >60)
Glucose, Bld: 90 mg/dL (ref 70–99)
Potassium: 3.9 mmol/L (ref 3.5–5.1)
Sodium: 139 mmol/L (ref 135–145)

## 2024-08-25 LAB — CBC
HCT: 36.9 % (ref 36.0–46.0)
Hemoglobin: 11.6 g/dL — ABNORMAL LOW (ref 12.0–15.0)
MCH: 28.2 pg (ref 26.0–34.0)
MCHC: 31.4 g/dL (ref 30.0–36.0)
MCV: 89.6 fL (ref 80.0–100.0)
Platelets: 266 K/uL (ref 150–400)
RBC: 4.12 MIL/uL (ref 3.87–5.11)
RDW: 13.5 % (ref 11.5–15.5)
WBC: 5.5 K/uL (ref 4.0–10.5)
nRBC: 0 % (ref 0.0–0.2)

## 2024-08-25 LAB — HEMOGLOBIN A1C
Hgb A1c MFr Bld: 5.3 % (ref 4.8–5.6)
Mean Plasma Glucose: 105.41 mg/dL

## 2024-08-25 LAB — SURGICAL PCR SCREEN
MRSA, PCR: NEGATIVE
Staphylococcus aureus: POSITIVE — AB

## 2024-08-25 NOTE — Progress Notes (Signed)
 PCP - Dr Heron Sharper Cardiologist - Dr Newman Lawrence (clearance 08/04/24) Rheumatology - Dr Maya Nash  CT Chest x-ray - 04/11/24 EKG - 08/04/24 Stress Test - 02/25/23 ECHO - 07/29/24 Cardiac Cath - n/a  ICD Pacemaker/Loop - n/a  Sleep Study -  Yes (12/2023) CPAP - Mild, no CPAP  Diabetes - n/a  Weight Loss Meds: Do not take Metformin  on the morning of surgery.  Last dose of Mounjaro  was on 08/15/24.   HgbA1c was ordered per Isaiah, PA at PAT appt.  Aspirin  & Blood Thinner Instructions:  n/a  ERAS - clear liquids til 8:30 AM DOS PRE-SURGERY Ensure drink  given with instructions.  Anesthesia review: Yes  STOP now taking any Aspirin  (unless otherwise instructed by your surgeon), Aleve, Naproxen, Ibuprofen , Motrin , Advil , Goody's, BC's, all herbal medications, fish oil, and all vitamins.   Coronavirus Screening Do you have any of the following symptoms:  Cough yes/no: No Fever (>100.14F)  yes/no: No Runny nose yes/no: No Sore throat yes/no: No Difficulty breathing/shortness of breath  yes/no: No  Have you traveled in the last 14 days and where? yes/no: No  Patient verbalized understanding of instructions that were given to them at the PAT appointment. Patient was also instructed that they will need to review over the PAT instructions again at home before surgery.

## 2024-08-25 NOTE — H&P (Signed)
 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 female with  right knee  osteonecrosis with osteochondral fracture and medial femoral condyle collapse.  The patient has been seen and evaluated in my clinic and is indicated to undergo right total knee arthroplasty due to her  right knee osteonecrosis with osteochondral fracture.  She has been seen and cleared by the appropriate specialist.  She presents today for total knee arthroplasty.  She has had no interval changes in her medical history and has been n.p.o. since midnight.  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   Bipolar 1 disorder (HCC)    Bronchitis    hx   COPD (chronic obstructive pulmonary disease) (HCC)    Coronary artery disease 08/04/2024   Depression    Dyspnea    with exertion   Osteoarthritis 03/10/2024   Pneumonia    Pre-diabetes    PTSD (post-traumatic stress disorder)    Sleep apnea 12/2023   mild, no CPAP   Uses walker    Past Surgical History:  Procedure Laterality Date   bone spurs foot Right    CATARACT EXTRACTION Bilateral    COLONOSCOPY  07/2022   x several   HAND SURGERY Left    TONSILLECTOMY AND ADENOIDECTOMY     TOTAL HIP ARTHROPLASTY Left 03/19/2019   Procedure: LEFT TOTAL HIP ARTHROPLASTY ANTERIOR APPROACH;  Surgeon: Vernetta Lonni GRADE, MD;  Location: [facility];  Service: Orthopedics;  Laterality: Left;   TOTAL HIP ARTHROPLASTY Right 06/04/2019   Procedure: RIGHT TOTAL HIP ARTHROPLASTY ANTERIOR APPROACH;  Surgeon: Vernetta Lonni GRADE, MD;  Location: [facility];  Service:  Orthopedics;  Laterality: Right;   TOTAL KNEE ARTHROPLASTY Left 12/28/2021   Procedure: Left TOTAL KNEE ARTHROPLASTY;  Surgeon: Vernetta Lonni GRADE, MD;  Location: [facility];  Service: Orthopedics;  Laterality: Left;   Social History   Socioeconomic History   Marital status: Divorced    Spouse name: Not on file   Number of children: Not on file   Years of education: Not on file   Highest education level: Not on file  Occupational History   Occupation: retired  Tobacco Use   Smoking status: Former    Current packs/day: 0.00    Average packs/day: 1.3 packs/day for 43.0 years (53.8 ttl pk-yrs)    Types: Cigarettes    Start date: 05/19/1977    Quit date: 05/19/2020    Years since quitting: 4.2    Passive exposure: Never   Smokeless tobacco: Never  Vaping Use   Vaping status: Never Used  Substance and Sexual Activity   Alcohol use: Yes    Comment: occasional beer/wine   Drug use: No   Sexual activity: Not Currently    Birth control/protection: Post-menopausal  Other Topics Concern   Not on file  Social History Narrative   Not on file   Social Drivers of Health   Financial Resource Strain: Low Risk  (03/31/2024)   Overall Financial Resource Strain (CARDIA)    Difficulty of Paying Living Expenses: Not hard at all  Food Insecurity: No Food  Insecurity (03/31/2024)   Hunger Vital Sign    Worried About Running Out of Food in the Last Year: Never true    Ran Out of Food in the Last Year: Never true  Transportation Needs: No Transportation Needs (03/31/2024)   PRAPARE - Administrator, Civil Service (Medical): No    Lack of Transportation (Non-Medical): No  Physical Activity: Inactive (03/31/2024)   Exercise Vital Sign    Days of Exercise per Week: 0 days    Minutes of Exercise per Session: 0 min  Stress: No Stress Concern Present (03/31/2024)   [employer] of Occupational Health - Occupational Stress Questionnaire    Feeling of Stress: Not at all  Social  Connections: Moderately Integrated (03/31/2024)   Social Connection and Isolation Panel    Frequency of Communication with Friends and Family: More than three times a week    Frequency of Social Gatherings with Friends and Family: More than three times a week    Attends Religious Services: More than 4 times per year    Active Member of [employer] or Organizations: Yes    Attends Engineer, Structural: More than 4 times per year    Marital Status: Divorced   Family History  Problem Relation Age of Onset   CAD Mother 77   COPD Mother    Depression Mother    Heart attack Father    Colon polyps Sister    Cancer Maternal Aunt        growth on face- underwent chemo   Heart attack Maternal Grandmother    Early death Maternal Grandfather    Hearing loss Paternal Grandfather    Asthma Daughter    Hypertension Daughter    Bipolar disorder Son    Allergies  Allergen Reactions   Celexa [Citalopram Hydrobromide]     goes weird   Gabapentin      goes weird  Other reaction(s): hall   Prior to Admission medications   Medication Sig Start Date End Date Taking? Authorizing Provider  albuterol  (VENTOLIN  HFA) 108 (90 Base) MCG/ACT inhaler Inhale 1-2 puffs into the lungs every 6 (six) hours as needed for wheezing or shortness of breath.   Yes [provider]  atorvastatin  (LIPITOR ) 80 MG tablet Take 1 tablet (80 mg total) by mouth daily. Patient taking differently: Take 80 mg by mouth every evening. 03/08/24  Yes Patwardhan, Newman PARAS, MD  buPROPion  (WELLBUTRIN  XL) 150 MG 24 hr tablet Take 150 mg by mouth in the morning. 07/31/24  Yes [provider]  cyclobenzaprine  (FLEXERIL ) 10 MG tablet Take 0.5-1 tablets (5-10 mg total) by mouth 3 (three) times daily as needed for muscle spasms. 05/31/24  Yes Johnny Garnette LABOR, MD  Dermatological Products, Misc. (KERASAL FUNGAL NAIL RENEWAL EX) Apply 1 Application topically in the morning and at bedtime.   Yes [provider]   Fluticasone -Umeclidin-Vilant (TRELEGY ELLIPTA ) 100-62.5-25 MCG/ACT AEPB INHALE 1 PUFF ONCE DAILY 05/28/24  Yes Mannam, Praveen, MD  metFORMIN  (GLUCOPHAGE ) 1000 MG tablet TAKE 1 TABLET BY MOUTH TWICE DAILY WITH A MEAL 07/26/24  Yes Ozell Heron HERO, MD  QUEtiapine  (SEROQUEL  XR) 400 MG 24 hr tablet Take 800 mg by mouth at bedtime. 07/27/24  Yes [provider]  tirzepatide  (MOUNJARO ) 5 MG/0.5ML Pen Inject 5 mg into the skin once a week. Patient taking differently: Inject 5 mg into the skin every Sunday. 01/07/24  Yes Ozell Heron HERO, MD   No results found.  Positive ROS: All other systems have been  reviewed and were otherwise negative with the exception of those mentioned in the HPI and as above.  Physical Exam: General: Alert, no acute distress Cardiovascular: No pedal edema Respiratory: No cyanosis, no use of accessory musculature GI: No organomegaly, abdomen is soft and non-tender Skin: No lesions in the area of chief complaint Neurologic: Sensation intact distally Psychiatric: Patient is competent for consent with normal mood and affect Lymphatic: No axillary or cervical lymphadenopathy  MUSCULOSKELETAL:   Right lower extremity -  skin intact, moderate effusion -  tender to palpation medial and lateral joint lines -  knee range of motion 0 to 130 degrees flexion extension -  stable to varus/valgus stress, anterior drawer and posterior drawer -  motor intact - sensation intact to light touch sural, saphenous, tibial, deep and superficial peroneal nerve distributions -  2+ DP pulse   Assessment:  Right knee  osteonecrosis with fracture and medial femoral condyle collapse  Plan:  Informed consent obtained Proceed with right total knee arthroplasty with  bone biopsy  2 g Ancef  for antibiotic prophylaxis  1 g TXA to minimize blood loss  Regional nerve block with anesthesia  Admit overnight for observation postoperatively  PT OT postop  Will return to clinic in 2  weeks postop for skin check    Rankin LELON Pizza, MD    08/26/2024 9:35 AM

## 2024-08-26 ENCOUNTER — Other Ambulatory Visit: Payer: Self-pay

## 2024-08-26 ENCOUNTER — Observation Stay (HOSPITAL_COMMUNITY): Admission: RE | Admit: 2024-08-26 | Discharge: 2024-08-27 | Disposition: A | Discharge status: Home / Self Care

## 2024-08-26 ENCOUNTER — Encounter (HOSPITAL_COMMUNITY): Payer: Self-pay

## 2024-08-26 ENCOUNTER — Ambulatory Visit (HOSPITAL_BASED_OUTPATIENT_CLINIC_OR_DEPARTMENT_OTHER): Payer: Self-pay | Admitting: Anesthesiology

## 2024-08-26 ENCOUNTER — Encounter (HOSPITAL_COMMUNITY): Payer: Self-pay | Admitting: Vascular Surgery

## 2024-08-26 ENCOUNTER — Encounter (HOSPITAL_COMMUNITY): Admission: RE | Disposition: A | Discharge status: Home / Self Care | Payer: Self-pay | Source: Home / Self Care

## 2024-08-26 DIAGNOSIS — Z96651 Presence of right artificial knee joint: Principal | ICD-10-CM

## 2024-08-26 DIAGNOSIS — Z96659 Presence of unspecified artificial knee joint: Secondary | ICD-10-CM | POA: Diagnosis not present

## 2024-08-26 DIAGNOSIS — M1711 Unilateral primary osteoarthritis, right knee: Secondary | ICD-10-CM | POA: Diagnosis not present

## 2024-08-26 DIAGNOSIS — F418 Other specified anxiety disorders: Secondary | ICD-10-CM | POA: Diagnosis not present

## 2024-08-26 DIAGNOSIS — M87851 Other osteonecrosis, right femur: Principal | ICD-10-CM | POA: Insufficient documentation

## 2024-08-26 DIAGNOSIS — F319 Bipolar disorder, unspecified: Secondary | ICD-10-CM | POA: Diagnosis present

## 2024-08-26 DIAGNOSIS — J449 Chronic obstructive pulmonary disease, unspecified: Secondary | ICD-10-CM | POA: Diagnosis not present

## 2024-08-26 DIAGNOSIS — Z96652 Presence of left artificial knee joint: Secondary | ICD-10-CM | POA: Diagnosis not present

## 2024-08-26 DIAGNOSIS — E785 Hyperlipidemia, unspecified: Secondary | ICD-10-CM | POA: Diagnosis present

## 2024-08-26 DIAGNOSIS — Z87891 Personal history of nicotine dependence: Secondary | ICD-10-CM

## 2024-08-26 DIAGNOSIS — F419 Anxiety disorder, unspecified: Secondary | ICD-10-CM | POA: Diagnosis not present

## 2024-08-26 DIAGNOSIS — Z79899 Other long term (current) drug therapy: Secondary | ICD-10-CM | POA: Insufficient documentation

## 2024-08-26 DIAGNOSIS — F4310 Post-traumatic stress disorder, unspecified: Secondary | ICD-10-CM | POA: Diagnosis present

## 2024-08-26 DIAGNOSIS — I2510 Atherosclerotic heart disease of native coronary artery without angina pectoris: Secondary | ICD-10-CM

## 2024-08-26 DIAGNOSIS — M45 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: Secondary | ICD-10-CM | POA: Diagnosis present

## 2024-08-26 DIAGNOSIS — Z96641 Presence of right artificial hip joint: Secondary | ICD-10-CM | POA: Insufficient documentation

## 2024-08-26 DIAGNOSIS — E119 Type 2 diabetes mellitus without complications: Secondary | ICD-10-CM

## 2024-08-26 DIAGNOSIS — G2581 Restless legs syndrome: Secondary | ICD-10-CM | POA: Diagnosis present

## 2024-08-26 HISTORY — PX: TOTAL KNEE ARTHROPLASTY: SHX125

## 2024-08-26 LAB — GLUCOSE, CAPILLARY: Glucose-Capillary: 125 mg/dL — ABNORMAL HIGH (ref 70–99)

## 2024-08-26 SURGERY — ARTHROPLASTY, KNEE, TOTAL
Anesthesia: Monitor Anesthesia Care | Site: Knee | Laterality: Right

## 2024-08-26 MED ORDER — ACETAMINOPHEN 500 MG PO TABS
1000.0000 mg | ORAL_TABLET | Freq: Once | ORAL | Status: AC
  Administered 2024-08-26: 1000 mg via ORAL

## 2024-08-26 MED ORDER — ONDANSETRON HCL 4 MG/2ML IJ SOLN
INTRAMUSCULAR | Status: AC
Start: 2024-08-26 — End: 2024-08-26
  Filled 2024-08-26: qty 2

## 2024-08-26 MED ORDER — BUPIVACAINE IN DEXTROSE 0.75-8.25 % IT SOLN
INTRATHECAL | Status: DC | PRN
  Administered 2024-08-26: 1.6 mL via INTRATHECAL

## 2024-08-26 MED ORDER — ACETAMINOPHEN 500 MG PO TABS
ORAL_TABLET | ORAL | Status: AC
  Filled 2024-08-26: qty 2

## 2024-08-26 MED ORDER — METOCLOPRAMIDE HCL 5 MG PO TABS: 5.0000 mg | ORAL_TABLET | Freq: Three times a day (TID) | ORAL | Status: DC | PRN

## 2024-08-26 MED ORDER — MIDAZOLAM HCL 2 MG/2ML IJ SOLN
INTRAMUSCULAR | Status: AC
Start: 2024-08-26 — End: 2024-08-26
  Filled 2024-08-26: qty 2

## 2024-08-26 MED ORDER — OXYCODONE HCL 5 MG PO TABS
5.0000 mg | ORAL_TABLET | ORAL | Status: DC | PRN
  Filled 2024-08-26 (×2): qty 1

## 2024-08-26 MED ORDER — KERASAL FUNGAL NAIL RENEWAL EX SOLN: Freq: Two times a day (BID) | CUTANEOUS | Status: DC

## 2024-08-26 MED ORDER — DOCUSATE SODIUM 100 MG PO CAPS
100.0000 mg | ORAL_CAPSULE | Freq: Two times a day (BID) | ORAL | Status: DC
  Administered 2024-08-26 – 2024-08-27 (×2): 100 mg via ORAL
  Filled 2024-08-26 (×2): qty 1

## 2024-08-26 MED ORDER — CEFAZOLIN SODIUM-DEXTROSE 2-4 GM/100ML-% IV SOLN
INTRAVENOUS | Status: AC
  Filled 2024-08-26: qty 100

## 2024-08-26 MED ORDER — FENTANYL CITRATE (PF) 100 MCG/2ML IJ SOLN
INTRAMUSCULAR | Status: AC
  Filled 2024-08-26: qty 2

## 2024-08-26 MED ORDER — OXYCODONE HCL 5 MG PO TABS
10.0000 mg | ORAL_TABLET | ORAL | Status: DC | PRN
  Administered 2024-08-26 – 2024-08-27 (×4): 10 mg via ORAL
  Filled 2024-08-26 (×3): qty 2

## 2024-08-26 MED ORDER — ONDANSETRON HCL 4 MG/2ML IJ SOLN: 4.0000 mg | Freq: Four times a day (QID) | INTRAMUSCULAR | Status: DC | PRN

## 2024-08-26 MED ORDER — PROPOFOL 10 MG/ML IV BOLUS
INTRAVENOUS | Status: DC | PRN
  Administered 2024-08-26: 30 mg via INTRAVENOUS
  Administered 2024-08-26: 20 mg via INTRAVENOUS

## 2024-08-26 MED ORDER — METOCLOPRAMIDE HCL 5 MG/ML IJ SOLN: 5.0000 mg | Freq: Three times a day (TID) | INTRAMUSCULAR | Status: DC | PRN

## 2024-08-26 MED ORDER — MENTHOL 3 MG MT LOZG: 1.0000 | LOZENGE | OROMUCOSAL | Status: DC | PRN

## 2024-08-26 MED ORDER — FENTANYL CITRATE (PF) 100 MCG/2ML IJ SOLN
25.0000 ug | INTRAMUSCULAR | Status: DC | PRN
  Administered 2024-08-26: 50 ug via INTRAVENOUS

## 2024-08-26 MED ORDER — BUDESON-GLYCOPYRROL-FORMOTEROL 160-9-4.8 MCG/ACT IN AERO
2.0000 | INHALATION_SPRAY | Freq: Two times a day (BID) | RESPIRATORY_TRACT | Status: DC
  Administered 2024-08-26 – 2024-08-27 (×2): 2 via RESPIRATORY_TRACT
  Filled 2024-08-26: qty 5.9

## 2024-08-26 MED ORDER — TRANEXAMIC ACID-NACL 1000-0.7 MG/100ML-% IV SOLN
1000.0000 mg | INTRAVENOUS | Status: AC
  Administered 2024-08-26: 1000 mg via INTRAVENOUS
  Filled 2024-08-26: qty 100

## 2024-08-26 MED ORDER — VANCOMYCIN HCL 1000 MG IV SOLR
INTRAVENOUS | Status: AC
  Filled 2024-08-26: qty 20

## 2024-08-26 MED ORDER — MUPIROCIN 2 % EX OINT
1.0000 | TOPICAL_OINTMENT | Freq: Two times a day (BID) | CUTANEOUS | Status: DC
  Administered 2024-08-26 – 2024-08-27 (×2): 1 via NASAL
  Filled 2024-08-26 (×2): qty 22

## 2024-08-26 MED ORDER — ONDANSETRON HCL 4 MG PO TABS
4.0000 mg | ORAL_TABLET | Freq: Four times a day (QID) | ORAL | Status: DC | PRN
Start: 2024-08-26 — End: 2024-08-27

## 2024-08-26 MED ORDER — BUPROPION HCL ER (XL) 150 MG PO TB24
150.0000 mg | ORAL_TABLET | Freq: Every morning | ORAL | Status: DC
  Administered 2024-08-27: 150 mg via ORAL
  Filled 2024-08-26: qty 1

## 2024-08-26 MED ORDER — ROPIVACAINE HCL 5 MG/ML IJ SOLN
INTRAMUSCULAR | Status: DC | PRN
Start: 2024-08-26 — End: 2024-08-26
  Administered 2024-08-26: 20 mL via PERINEURAL

## 2024-08-26 MED ORDER — INSULIN ASPART 100 UNIT/ML IJ SOLN
0.0000 [IU] | Freq: Three times a day (TID) | INTRAMUSCULAR | Status: DC
  Filled 2024-08-26: qty 0.09

## 2024-08-26 MED ORDER — CHLORHEXIDINE GLUCONATE CLOTH 2 % EX PADS
6.0000 | MEDICATED_PAD | Freq: Every day | CUTANEOUS | Status: DC
  Administered 2024-08-27: 6 via TOPICAL

## 2024-08-26 MED ORDER — DEXAMETHASONE SOD PHOSPHATE PF 10 MG/ML IJ SOLN
INTRAMUSCULAR | Status: DC | PRN
  Administered 2024-08-26: 5 mg via INTRAVENOUS

## 2024-08-26 MED ORDER — OXYCODONE HCL 5 MG/5ML PO SOLN: 5.0000 mg | Freq: Once | ORAL | Status: DC | PRN

## 2024-08-26 MED ORDER — CLONIDINE HCL (ANALGESIA) 100 MCG/ML EP SOLN
EPIDURAL | Status: AC
  Filled 2024-08-26: qty 10

## 2024-08-26 MED ORDER — PROPOFOL 500 MG/50ML IV EMUL
INTRAVENOUS | Status: DC | PRN
Start: 2024-08-26 — End: 2024-08-26
  Administered 2024-08-26: 125 ug/kg/min via INTRAVENOUS

## 2024-08-26 MED ORDER — OXYCODONE HCL 5 MG PO TABS: 5.0000 mg | ORAL_TABLET | Freq: Once | ORAL | Status: DC | PRN

## 2024-08-26 MED ORDER — CHLORHEXIDINE GLUCONATE 0.12 % MT SOLN
OROMUCOSAL | Status: AC
  Administered 2024-08-26: 15 mL via OROMUCOSAL
  Filled 2024-08-26: qty 15

## 2024-08-26 MED ORDER — KETOROLAC TROMETHAMINE 30 MG/ML IJ SOLN
INTRAMUSCULAR | Status: DC | PRN
  Administered 2024-08-26: 32 mL via INTRAMUSCULAR

## 2024-08-26 MED ORDER — ORAL CARE MOUTH RINSE: 15.0000 mL | Freq: Once | OROMUCOSAL | Status: AC

## 2024-08-26 MED ORDER — KETOROLAC TROMETHAMINE 30 MG/ML IJ SOLN
INTRAMUSCULAR | Status: AC
  Filled 2024-08-26: qty 1

## 2024-08-26 MED ORDER — FENTANYL CITRATE (PF) 100 MCG/2ML IJ SOLN: 100.0000 ug | Freq: Once | INTRAMUSCULAR | Status: AC

## 2024-08-26 MED ORDER — LACTATED RINGERS IV SOLN: INTRAVENOUS | Status: DC

## 2024-08-26 MED ORDER — METHOCARBAMOL 500 MG PO TABS
500.0000 mg | ORAL_TABLET | Freq: Four times a day (QID) | ORAL | Status: DC | PRN
Start: 2024-08-26 — End: 2024-08-27
  Administered 2024-08-27: 500 mg via ORAL
  Filled 2024-08-26: qty 1

## 2024-08-26 MED ORDER — TRANEXAMIC ACID 1000 MG/10ML IV SOLN
2000.0000 mg | Freq: Once | INTRAVENOUS | Status: DC
  Filled 2024-08-26 (×2): qty 20

## 2024-08-26 MED ORDER — ACETAMINOPHEN 500 MG PO TABS
1000.0000 mg | ORAL_TABLET | Freq: Three times a day (TID) | ORAL | Status: DC
  Administered 2024-08-26 – 2024-08-27 (×2): 1000 mg via ORAL
  Filled 2024-08-26 (×2): qty 2

## 2024-08-26 MED ORDER — HYDROMORPHONE HCL 1 MG/ML IJ SOLN: 0.5000 mg | INTRAMUSCULAR | Status: DC | PRN

## 2024-08-26 MED ORDER — PROPOFOL 1000 MG/100ML IV EMUL
INTRAVENOUS | Status: AC
  Filled 2024-08-26: qty 100

## 2024-08-26 MED ORDER — PROPOFOL 10 MG/ML IV BOLUS
INTRAVENOUS | Status: AC
  Filled 2024-08-26: qty 20

## 2024-08-26 MED ORDER — BUPIVACAINE-EPINEPHRINE (PF) 0.25% -1:200000 IJ SOLN
INTRAMUSCULAR | Status: AC
  Filled 2024-08-26: qty 30

## 2024-08-26 MED ORDER — CHLORHEXIDINE GLUCONATE 0.12 % MT SOLN: 15.0000 mL | Freq: Once | OROMUCOSAL | Status: AC

## 2024-08-26 MED ORDER — QUETIAPINE FUMARATE ER 300 MG PO TB24
800.0000 mg | ORAL_TABLET | Freq: Every day | ORAL | Status: DC
  Administered 2024-08-26: 800 mg via ORAL
  Filled 2024-08-26 (×2): qty 1

## 2024-08-26 MED ORDER — TRANEXAMIC ACID-NACL 1000-0.7 MG/100ML-% IV SOLN: 1000.0000 mg | Freq: Once | INTRAVENOUS | Status: DC

## 2024-08-26 MED ORDER — LIDOCAINE 2% (20 MG/ML) 5 ML SYRINGE
INTRAMUSCULAR | Status: AC
  Filled 2024-08-26: qty 5

## 2024-08-26 MED ORDER — FENTANYL CITRATE (PF) 100 MCG/2ML IJ SOLN
INTRAMUSCULAR | Status: AC
  Administered 2024-08-26: 100 ug via INTRAVENOUS
  Filled 2024-08-26: qty 2

## 2024-08-26 MED ORDER — ONDANSETRON HCL 4 MG/2ML IJ SOLN
INTRAMUSCULAR | Status: DC | PRN
Start: 2024-08-26 — End: 2024-08-26
  Administered 2024-08-26: 4 mg via INTRAVENOUS

## 2024-08-26 MED ORDER — ALBUTEROL SULFATE (2.5 MG/3ML) 0.083% IN NEBU: 3.0000 mL | INHALATION_SOLUTION | Freq: Four times a day (QID) | RESPIRATORY_TRACT | Status: DC | PRN

## 2024-08-26 MED ORDER — AMISULPRIDE (ANTIEMETIC) 5 MG/2ML IV SOLN: 10.0000 mg | Freq: Once | INTRAVENOUS | Status: DC | PRN

## 2024-08-26 MED ORDER — ATORVASTATIN CALCIUM 80 MG PO TABS
80.0000 mg | ORAL_TABLET | Freq: Every evening | ORAL | Status: DC
  Administered 2024-08-26: 80 mg via ORAL
  Filled 2024-08-26: qty 1

## 2024-08-26 MED ORDER — CEFAZOLIN SODIUM-DEXTROSE 2-4 GM/100ML-% IV SOLN
2.0000 g | Freq: Four times a day (QID) | INTRAVENOUS | Status: AC
  Administered 2024-08-26 – 2024-08-27 (×2): 2 g via INTRAVENOUS
  Filled 2024-08-26 (×2): qty 100

## 2024-08-26 MED ORDER — CEFAZOLIN SODIUM-DEXTROSE 2-4 GM/100ML-% IV SOLN
2.0000 g | INTRAVENOUS | Status: AC
  Administered 2024-08-26: 2 g via INTRAVENOUS

## 2024-08-26 MED ORDER — PHENOL 1.4 % MT LIQD: 1.0000 | OROMUCOSAL | Status: DC | PRN

## 2024-08-26 MED ORDER — METHOCARBAMOL 1000 MG/10ML IJ SOLN: 500.0000 mg | Freq: Four times a day (QID) | INTRAMUSCULAR | Status: DC | PRN

## 2024-08-26 MED ORDER — SENNA 8.6 MG PO TABS
1.0000 | ORAL_TABLET | Freq: Two times a day (BID) | ORAL | Status: DC
  Administered 2024-08-26 – 2024-08-27 (×2): 8.6 mg via ORAL
  Filled 2024-08-26 (×2): qty 1

## 2024-08-26 MED ORDER — VANCOMYCIN HCL 1000 MG IV SOLR
INTRAVENOUS | Status: DC | PRN
  Administered 2024-08-26: 1000 mg via TOPICAL

## 2024-08-26 MED ORDER — INSULIN ASPART 100 UNIT/ML IJ SOLN
0.0000 [IU] | Freq: Every day | INTRAMUSCULAR | Status: DC
  Filled 2024-08-26: qty 0.05

## 2024-08-26 MED ORDER — ASPIRIN 81 MG PO CHEW
81.0000 mg | CHEWABLE_TABLET | Freq: Two times a day (BID) | ORAL | Status: DC
  Administered 2024-08-26 – 2024-08-27 (×2): 81 mg via ORAL
  Filled 2024-08-26 (×2): qty 1

## 2024-08-26 SURGICAL SUPPLY — 44 items
ATTUNE MED DOME PAT 38 KNEE (Knees) IMPLANT
ATTUNE PS FEM RT SZ 6 CEM KNEE (Femur) IMPLANT
BAG COUNTER SPONGE SURGICOUNT (BAG) ×1 IMPLANT
BASEPLATE TIB CMT FB PCKT SZ4 (Stem) IMPLANT
BLADE SAW RECIP 87.9 MT (BLADE) IMPLANT
BLADE SAW SAG 90X13X1.27 (BLADE) IMPLANT
BNDG ELASTIC 6INX 5YD STR LF (GAUZE/BANDAGES/DRESSINGS) ×1 IMPLANT
BOWL SMART MIX CTS (DISPOSABLE) ×1 IMPLANT
CEMENT HV SMART SET (Cement) IMPLANT
CHLORAPREP W/TINT 26 (MISCELLANEOUS) ×2 IMPLANT
COOLER ICEMAN CLASSIC (MISCELLANEOUS) IMPLANT
COVER SURGICAL LIGHT HANDLE (MISCELLANEOUS) ×1 IMPLANT
CUFF TRNQT CYL 34X4.125X (TOURNIQUET CUFF) ×1 IMPLANT
DERMABOND ADVANCED .7 DNX12 (GAUZE/BANDAGES/DRESSINGS) ×1 IMPLANT
DRAPE U-SHAPE 47X51 STRL (DRAPES) ×1 IMPLANT
DRESSING AQUACEL AG SP 3.5X10 (GAUZE/BANDAGES/DRESSINGS) ×1 IMPLANT
DRSG MEPILEX POST OP 4X12 (GAUZE/BANDAGES/DRESSINGS) IMPLANT
GLOVE BIO SURGEON STRL SZ8 (GLOVE) ×2 IMPLANT
GLOVE BIOGEL PI IND STRL 8 (GLOVE) ×1 IMPLANT
GOWN STRL REUS W/ TWL LRG LVL3 (GOWN DISPOSABLE) ×1 IMPLANT
GOWN STRL REUS W/ TWL XL LVL3 (GOWN DISPOSABLE) ×2 IMPLANT
HOOD PEEL AWAY T7 (MISCELLANEOUS) IMPLANT
INSERT TIB ATTUNE FB SZ6X6 (Insert) IMPLANT
LAVAGE JET IRRISEPT WOUND (IRRIGATION / IRRIGATOR) IMPLANT
MANIFOLD NEPTUNE II (INSTRUMENTS) ×1 IMPLANT
PACK TOTAL JOINT (CUSTOM PROCEDURE TRAY) ×1 IMPLANT
PAD CAST 4YDX4 CTTN HI CHSV (CAST SUPPLIES) IMPLANT
PAD COLD SHLDR WRAP-ON (PAD) IMPLANT
PIN FIX SIGMA LCS THRD HI (PIN) IMPLANT
SET HNDPC FAN SPRY TIP SCT (DISPOSABLE) ×1 IMPLANT
SET PAD KNEE POSITIONER (MISCELLANEOUS) ×1 IMPLANT
SOLN STERILE WATER BTL 1000 ML (IV SOLUTION) ×1 IMPLANT
SPIKE FLUID TRANSFER (MISCELLANEOUS) ×1 IMPLANT
SUCTION TUBE FRAZIER 8FR DISP (SUCTIONS) IMPLANT
SUT ETHIBOND 2 V 37 (SUTURE) IMPLANT
SUT MNCRL AB 3-0 PS2 18 (SUTURE) IMPLANT
SUT MNCRL AB 4-0 PS2 18 (SUTURE) ×1 IMPLANT
SUT VIC AB 1 CT1 27XBRD ANBCTR (SUTURE) IMPLANT
SUT VIC AB 1 CT1 27XBRD ANTBC (SUTURE) ×1 IMPLANT
SUT VIC AB 2-0 CT1 TAPERPNT 27 (SUTURE) ×3 IMPLANT
SUTURE STRATFX 0 PDS 27 VIOLET (SUTURE) IMPLANT
SYR 50ML LL SCALE MARK (SYRINGE) ×1 IMPLANT
TRAY FOLEY MTR SLVR 16FR STAT (SET/KITS/TRAYS/PACK) IMPLANT
WRAP KNEE MAXI GEL POST OP (GAUZE/BANDAGES/DRESSINGS) ×1 IMPLANT

## 2024-08-26 NOTE — Anesthesia Procedure Notes (Signed)
 Spinal  Patient location during procedure: OR Start time: 08/26/2024 12:18 PM End time: 08/26/2024 12:24 PM Reason for block: surgical anesthesia Staffing Performed: anesthesiologist  Anesthesiologist: Peggye Delon Brunswick, MD Performed by: Peggye Delon Brunswick, MD Authorized by: Peggye Delon Brunswick, MD   Preanesthetic Checklist Completed: patient identified, IV checked, site marked, risks and benefits discussed, surgical consent, monitors and equipment checked, pre-op evaluation and timeout performed Spinal Block Patient position: sitting Prep: DuraPrep Patient monitoring: blood pressure and continuous pulse ox Approach: midline Location: L3-4 Injection technique: single-shot Needle Needle type: Pencan  Needle gauge: 24 G Needle length: 9 cm Additional Notes Risks and benefits of neuraxial anesthesia including, but not limited to, infection, bleeding, local anesthetic toxicity, headache, hypotension, back pain, block failure, etc. were discussed with the patient. The patient expressed understanding and consented to the procedure. I confirmed that the patient has no bleeding disorders and is not taking blood thinners. I confirmed the patient's last platelet count with the nurse. Monitors were applied. A time-out was performed immediately prior to the procedure. Sterile technique was used throughout the whole procedure.   2 attempt(s)

## 2024-08-26 NOTE — Anesthesia Preprocedure Evaluation (Addendum)
 Anesthesia Evaluation  Patient identified by MRN, date of birth, ID band Patient awake    Reviewed: Allergy & Precautions, NPO status , Patient's Chart, lab work & pertinent test results  History of Anesthesia Complications Negative for: history of anesthetic complications  Airway Mallampati: II  TM Distance: >3 FB Neck ROM: Full    Dental  (+) Dental Advisory Given   Pulmonary neg shortness of breath, sleep apnea , COPD (last used rescue inhaler 3 months ago),  COPD inhaler, neg recent URI, former smoker   Pulmonary exam normal breath sounds clear to auscultation       Cardiovascular (-) hypertension(-) angina + CAD  (-) Past MI, (-) Cardiac Stents and (-) CABG (-) dysrhythmias  Rhythm:Regular Rate:Normal  HLD  TTE 07/29/2024: IMPRESSIONS    1. Left ventricular ejection fraction, by estimation, is 60 to 65%. Left  ventricular ejection fraction by 3D volume is 61 %. The left ventricle has  normal function. The left ventricle has no regional wall motion  abnormalities. Left ventricular diastolic   parameters are consistent with Grade I diastolic dysfunction (impaired  relaxation).   2. Right ventricular systolic function is normal. The right ventricular  size is normal. Tricuspid regurgitation signal is inadequate for assessing  PA pressure.   3. The mitral valve is normal in structure. No evidence of mitral valve  regurgitation. No evidence of mitral stenosis.   4. The left aortic cusp is heavily calcified and has limited mobility,  but the other cusps move well. The aortic valve is tricuspid. There is  moderate calcification of the aortic valve. There is moderate thickening  of the aortic valve. Aortic valve  regurgitation is not visualized. Aortic valve sclerosis/calcification is  present, without any evidence of aortic stenosis.   5. The inferior vena cava is normal in size with greater than 50%  respiratory  variability, suggesting right atrial pressure of 3 mmHg.   Normal myocardial perfusion scan 02/25/2023    Neuro/Psych  PSYCHIATRIC DISORDERS (PTSD) Anxiety Depression Bipolar Disorder   negative neurological ROS     GI/Hepatic negative GI ROS, Neg liver ROS,,,  Endo/Other  neg diabetes (Hgb A1c 5.3)    Renal/GU negative Renal ROS     Musculoskeletal  (+) Arthritis , Osteoarthritis,  Avascular necrosis of hips, ankylosing spondylitis   Abdominal   Peds  Hematology negative hematology ROS (+)   Anesthesia Other Findings Last Mounjaro : 08/15/2024  Reproductive/Obstetrics                              Anesthesia Physical Anesthesia Plan  ASA: 3  Anesthesia Plan: MAC and Spinal   Post-op Pain Management: Regional block* and Tylenol  PO (pre-op)*   Induction: Intravenous  PONV Risk Score and Plan: 2 and Ondansetron , Dexamethasone  and Treatment may vary due to age or medical condition  Airway Management Planned: Natural Airway and Simple Face Mask  Additional Equipment:   Intra-op Plan:   Post-operative Plan:   Informed Consent: I have reviewed the patients History and Physical, chart, labs and discussed the procedure including the risks, benefits and alternatives for the proposed anesthesia with the patient or authorized representative who has indicated his/her understanding and acceptance.     Dental advisory given  Plan Discussed with: CRNA and Anesthesiologist  Anesthesia Plan Comments: (Discussed potential risks of nerve blocks including, but not limited to, infection, bleeding, nerve damage, seizures, pneumothorax, respiratory depression, and potential failure of the block. Alternatives  to nerve blocks discussed. All questions answered.  I have discussed risks of neuraxial anesthesia including but not limited to infection, bleeding, nerve injury, back pain, headache, seizures, and failure of block. Patient denies bleeding disorders and  is not currently anticoagulated. Labs have been reviewed. Risks and benefits discussed. All patient's questions answered.   Discussed with patient risks of MAC including, but not limited to, minor pain or discomfort, hearing people in the room, and possible need for backup general anesthesia. Risks for general anesthesia also discussed including, but not limited to, sore throat, hoarse voice, chipped/damaged teeth, injury to vocal cords, nausea and vomiting, allergic reactions, lung infection, heart attack, stroke, and death. All questions answered. )         Anesthesia Quick Evaluation

## 2024-08-26 NOTE — Op Note (Signed)
 DATE OF SURGERY: 08/26/2024  PREOPERATIVE DIAGNOSES:  1. 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 The same  PROCEDURES:  1. Right total knee arthroplasty, cemented  SURGEON: Surgeons and Role:    DEWAINE Teresa Rankin LELON, MD - Primary  ANESTHESIA: General  TOURNIQUET TIME:  Total Tourniquet Time Documented: Thigh (Right) - 94 minutes Total: Thigh (Right) - 94 minutes    BLOOD LOSS: Minimal.   COMPLICATIONS: None.   PATHOLOGY: medial femoral condyle bone biopsy  FINDINGS: Medial femoral condyle osteochondral fracture with collapse, lateral compartment and patellofemoral osteoarthritis  ASSISTANTS:  Brad Dixon PA-C Joey Dixon, RFNA  INDICATIONS FOR SURGICAL ASSISTANTS: During the operation, the services of qualified surgical assistants were medically necessary and indicated to provide adequate surgical exposure and necessary to maintain the limb in the proper position during the procedure to carry out the operation safely and effectively.  Without the qualified assistant being present, we would have extended up the operative procedure and made the procedure more technically difficult to perform.    COMPONENTS USED:  DePuy posterior stabilized knee   system, a size 6 femur, 4 tibia, 6mm mm PS polyethylene insert, and 38 mm anatomic patellar button.     COMPLICATION:  None.      DRAINS:  None.      TOURNIQUET TIME:   Total Tourniquet Time Documented: Thigh (Right) - 94 minutes Total: Thigh (Right) - 94 minutes  .         INDICATION FOR PROCEDURE:  Judy [name] is a 73 y.o. female patient of mine.  The patient had been seen, evaluated, and treated for months prior to my evaluation she had been treated conservatively in the office with medication, activity modification, and injections.  The patient had  radiographic changes of osteonecrosis with medial femoral condyle with fracture and collaps.  Based  on the radiographic changes and failed conservative measures, the patient decided to proceed with definitive treatment, total knee replacement.  Risks of infection, DVT, component failure, need for revision surgery, neurovascular injury were reviewed in the office setting.  The postop course was reviewed stressing the efforts to maximize post-operative satisfaction and function.  Consent was obtained for benefit of pain relief.      PROCEDURE IN DETAIL:   The patient was brought to the operative theater. Once adequate anesthesia, preoperative antibiotics, 2 gm of ancef  and 1 gm of Tranexamic Acid , the patient was positioned supine with a thigh tourniquet placed.  The right lower extremity was prepped and draped in sterile fashion.  A time- out was performed identifying the patient, planned procedure, and the appropriate extremity.     The right lower extremity was placed in the [facility] leg holder.  The leg was exsanguinated, tourniquet elevated to 250 mmHg.  A midline incision was made with a 10 blade and full thickness skin flaps were elevated medially and laterally.   A medial parapatellar arthrotomy was then performed and the fat pad behind the patellar tendon was resected and the ACL was removed.  A sleeve of tissue was elevated off the bone of the medial proximal tibia. Satisfied with the soft tissue releases, attention was now directed to the femur.  The femoral canal was opened with a drill, irrigated to try to prevent fat emboli.  An  intramedullary rod was passed at 6 degrees valgus, 9 mm of bone were resected off the distal femur using the jig and saw.    Following  this resection, the tibia was  subluxated anteriorly.  Using the extramedullary guide, 2 mm of bone was resected off   the proximal medial tibia.  We confirmed the gap would be stable medially and laterally with a size 5 spacer block as well as confirmed that the tibial cut was perpendicular in the coronal plane, checking with an  alignment rod.      Once this was done, I sized the femur to be a size 6 in the anterior- posterior dimension, chose a 6 standard component based on medial and lateral dimension.  The size 6 rotation block was then pinned in position anterior referenced using the C-clamp to set rotation.  The anterior, posterior, and  chamfer cuts were made without difficulty nor notching making certain that I was along the anterior cortex to help with flexion gap stability.  A laminar spreader was used at this time to gain access and resect the medial and lateral menisci.     The final box cut was made off the lateral aspect of distal femur.   Trial reduction was now carried with a 6 femur,  4 tibia, a 6 mm PS insert. This was found to track well without laxity to varus or valgus stress at 0 or 30 deg of flexion. Attention was then directed to the patella.  Precut measurement was noted to be 24 mm.  I resected down to 18 mm and used a 38 mm anatomic patellar button to restore patellar height as well as cover the cut surface. The lug holes were drilled and a metal shim was placed to protect the  patella from retractors and saw blade during the procedure. The knee was brought to full extension with good flexion stability with the patella  tracking through the trochlea without application of pressure.    Satisfied with the trial implants, the lug holes were made in the femur and rotation was noted for the tibia. The trial components were removed and the tibia was sized to be a size 4.  The size 4 tray was then pinned in position through the medial third of the tubercle, drilled, and keel punched.    Given all these findings and the trial components removed.  Final components were opened and cement was mixed.  The knee was irrigated with normal saline solution and pulse lavage.  The synovial lining was  then injected with 30 cc of 0.25% Marcaine  with epinephrine , 1 cc of Toradol and 1 cc of clonidine  for a total of 32 cc.      Final implants were then cemented onto cleaned and dried cut surfaces of bone with the knee brought to extension with a 6 mm PS trial insert.      Once the cement had fully cured, excess cement was removed throughout the knee.  I confirmed that I was satisfied with the range of motion and stability, and the final 6 mm PS polyethylene insert was chosen.  It was placed into the knee.      The tourniquet had been let down and no significant hemostasis was required.  The extensor mechanism was then reapproximated using #1 Vicryl and and # 2 ethibond sutures  with the knee  in flexion.  The  remaining wound was closed with 2-0 Vicryl and running 3-0 Monocryl followed by skin glue. The knee was cleaned, dried, dressed sterilely using  mepilex dressing, webril and an ace wrap.  The patient was then  brought to recovery room in stable condition,  tolerating the procedure  well.   Please note that Physician Assistant, Arvella Fireman, PA-C was present for the entirety of the case, and was utilized for pre-operative positioning, peri-operative retractor management, general facilitation of the procedure and for primary wound closure at the end of the case.          Rankin Pizza, MD   08/26/2024 6:40 PM

## 2024-08-26 NOTE — Consult Note (Signed)
 Initial Consultation Note   [name] FMW:[id number] DOB: 27-Apr-1951 PCP: Judy Heron HERO, MD DOA: 08/26/2024 DOS: the patient was seen and examined on 08/26/2024 Primary service: Judy [name] ORN, MD  Referring physician: Teresa [name] ORN, MD Reason for consult: Assistance with comorbidities  HPI/Course: Judy [name] is a 73 y.o. female with medical history significant of hyperlipidemia, diabetes, CAD, anxiety, PTSD, bipolar disorder, RLS, COPD, ankylosing spondylitis presenting for scheduled right total knee arthroplasty in the setting of osteonecrosis and osteochondral fracture.  Patient has known history of osteoporosis and osteochondral fracture for which she follows with orthopedic surgery.  Presents today for scheduled right total knee arthroplasty.  Underwent uncomplicated surgical intervention.  Hospitalist service asked to consult to assist with management of comorbid conditions.  Patient reports feeling okay with some pain after surgery. Patient not aware of her diabetes, though she was receiving diabetes medicines for weight loss only.  Patient denies fevers, chills, chest pain, shortness of breath, abdominal pain, constipation, diarrhea, nausea, vomiting.  Review of Systems: As per HPI otherwise all other systems reviewed and are negative.  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   Bipolar 1 disorder (HCC)    Bronchitis    hx   COPD (chronic obstructive pulmonary disease) (HCC)    Coronary artery disease 08/04/2024   Depression    Dyspnea    with exertion   Osteoarthritis 03/10/2024   Pneumonia    Pre-diabetes    PTSD (post-traumatic stress disorder)    Sleep apnea 12/2023   mild, no CPAP   Uses walker     Past Surgical  History:  Procedure Laterality Date   bone spurs foot Right    CATARACT EXTRACTION Bilateral    COLONOSCOPY  07/2022   x several   HAND SURGERY Left    TONSILLECTOMY AND ADENOIDECTOMY     TOTAL HIP ARTHROPLASTY Left 03/19/2019   Procedure: LEFT TOTAL HIP ARTHROPLASTY ANTERIOR APPROACH;  Surgeon: Judy Lonni GRADE, MD;  Location: [facility];  Service: Orthopedics;  Laterality: Left;   TOTAL HIP ARTHROPLASTY Right 06/04/2019   Procedure: RIGHT TOTAL HIP ARTHROPLASTY ANTERIOR APPROACH;  Surgeon: Judy Lonni GRADE, MD;  Location: [facility];  Service: Orthopedics;  Laterality: Right;   TOTAL KNEE ARTHROPLASTY Left 12/28/2021   Procedure: Left TOTAL KNEE ARTHROPLASTY;  Surgeon: Judy Lonni GRADE, MD;  Location: [facility];  Service: Orthopedics;  Laterality: Left;    Social History  reports that she quit smoking about 4 years ago. Her smoking use included cigarettes. She started smoking about 47 years ago. She has a 53.8 pack-year smoking history. She has never been exposed to tobacco smoke. She has never used smokeless tobacco. She reports current alcohol use. She reports that she does not use drugs.  Allergies  Allergen Reactions   Celexa [Citalopram Hydrobromide]     goes weird   Gabapentin      goes weird  Other reaction(s): hall    Family History  Problem Relation Age of Onset   CAD Mother 47   COPD Mother    Depression Mother    Heart attack Father    Colon polyps Sister    Cancer Maternal Aunt        growth on face- underwent  chemo   Heart attack Maternal Grandmother    Early death Maternal Grandfather    Hearing loss Paternal Grandfather    Asthma Daughter    Hypertension Daughter    Bipolar disorder Son   Reviewed on admission  Prior to Admission medications   Medication Sig Start Date End Date Taking? Authorizing Provider  atorvastatin  (LIPITOR ) 80 MG tablet Take 1 tablet (80 mg total) by mouth daily. Patient taking differently: Take 80 mg by mouth every  evening. 03/08/24  Yes Patwardhan, Manish J, MD  buPROPion  (WELLBUTRIN  XL) 150 MG 24 hr tablet Take 150 mg by mouth in the morning. 07/31/24  Yes [provider]  cyclobenzaprine  (FLEXERIL ) 10 MG tablet Take 0.5-1 tablets (5-10 mg total) by mouth 3 (three) times daily as needed for muscle spasms. 05/31/24  Yes Judy Garnette LABOR, MD  Dermatological Products, Misc. (KERASAL FUNGAL NAIL RENEWAL EX) Apply 1 Application topically in the morning and at bedtime.   Yes [provider]  Fluticasone -Umeclidin-Vilant (TRELEGY ELLIPTA ) 100-62.5-25 MCG/ACT AEPB INHALE 1 PUFF ONCE DAILY 05/28/24  Yes Mannam, Praveen, MD  metFORMIN  (GLUCOPHAGE ) 1000 MG tablet TAKE 1 TABLET BY MOUTH TWICE DAILY WITH A MEAL 07/26/24  Yes Judy Heron HERO, MD  QUEtiapine  (SEROQUEL  XR) 400 MG 24 hr tablet Take 800 mg by mouth at bedtime. 07/27/24  Yes [provider]  tirzepatide  (MOUNJARO ) 5 MG/0.5ML Pen Inject 5 mg into the skin once a week. Patient taking differently: Inject 5 mg into the skin every Sunday. 01/07/24  Yes Judy Heron HERO, MD  albuterol  (VENTOLIN  HFA) 108 [phone] Base) MCG/ACT inhaler Inhale 1-2 puffs into the lungs every 6 (six) hours as needed for wheezing or shortness of breath.    [provider]    Physical Exam: Vitals:   08/26/24 1515 08/26/24 1530 08/26/24 1545 08/26/24 1600  BP: 125/70 102/70 (!) 152/61 (!) 166/76  Pulse: 67 61 61 61  Resp: 10 20 12 14   Temp:      TempSrc:      SpO2: 100% 98% 97% 95%  Weight:      Height:        Physical Exam Constitutional:      General: She is not in acute distress.    Appearance: Normal appearance.  HENT:     Head: Normocephalic and atraumatic.     Mouth/Throat:     Mouth: Mucous membranes are moist.     Pharynx: Oropharynx is clear.  Eyes:     Extraocular Movements: Extraocular movements intact.     Pupils: Pupils are equal, round, and reactive to light.  Cardiovascular:     Rate and Rhythm: Normal rate and regular rhythm.      Pulses: Normal pulses.     Heart sounds: Normal heart sounds.  Pulmonary:     Effort: Pulmonary effort is normal. No respiratory distress.     Breath sounds: Normal breath sounds.  Abdominal:     General: Bowel sounds are normal. There is no distension.     Palpations: Abdomen is soft.     Tenderness: There is no abdominal tenderness.  Musculoskeletal:        General: No swelling or deformity.     Comments: Right knee bandaged.  Skin:    General: Skin is warm and dry.  Neurological:     General: No focal deficit present.     Mental Status: Mental status is at baseline.    Labs on Admission: I have personally reviewed following labs and imaging  studies  CBC: Recent Labs  Lab 08/25/24 1202  WBC 5.5  HGB 11.6*  HCT 36.9  MCV 89.6  PLT 266    Basic Metabolic Panel: Recent Labs  Lab 08/25/24 1202  NA 139  K 3.9  CL 104  CO2 23  GLUCOSE 90  BUN 13  CREATININE 1.12*  CALCIUM  9.9    GFR: Estimated Creatinine Clearance: 55.6 mL/min (A) (by C-G formula based on SCr of 1.12 mg/dL (H)).  Liver Function Tests: No results for input(s): AST, ALT, ALKPHOS, BILITOT, PROT, ALBUMIN in the last 168 hours.  Urine analysis:    Component Value Date/Time   COLORURINE YELLOW 08/23/2017 0325   APPEARANCEUR CLEAR 08/23/2017 0325   LABSPEC >1.046 (H) 08/23/2017 0325   PHURINE 5.0 08/23/2017 0325   GLUCOSEU NEGATIVE 08/23/2017 0325   HGBUR SMALL (A) 08/23/2017 0325   BILIRUBINUR NEGATIVE 08/23/2017 0325   KETONESUR NEGATIVE 08/23/2017 0325   PROTEINUR NEGATIVE 08/23/2017 0325   NITRITE NEGATIVE 08/23/2017 0325   LEUKOCYTESUR NEGATIVE 08/23/2017 0325    Radiological Exams on Admission: No results found.  EKG: Not performed this admission.  Assessment/Plan Principal Problem:   Status post total knee replacement Active Problems:   PTSD (post-traumatic stress disorder)   COPD (chronic obstructive pulmonary disease) (HCC)   Bipolar 1 disorder (HCC)    Anxiety   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)   Diabetes mellitus without complication (HCC)   Restless legs syndrome   Hyperlipidemia   Coronary artery disease involving native coronary artery of native heart without angina pectoris   Status post total knee replacement > Presented for scheduled right total knee arthroplasty in the setting of osteonecrosis and osteochondral fracture. > Underwent successful surgical intervention. - Management per primary team - Is receiving pain medication  Diabetes > Patient was unaware of diabetes diagnosis.  Thought she did not have it and that she was taking medicine for weight loss only.  Told her I would look into it. > Chart review indicates patient did have elevated A1c to 6.61-year ago and has been slowly improving with 5.8 earlier this year and improved to 5.3 yesterday.  Will need to be communicated with her. - SSI while admitted  Hyperlipidemia - Continue home atorvastatin   CAD - Continue home atorvastatin   Anxiety PTSD Bipolar disorder - Continue home bupropion and quetiapine   COPD - Replacement Trelegy with formulary Breztri  - Continue albuterol  as needed  TRH will continue to follow the patient.  Family Communication: None on admission  Thank you very much for involving us  in the care of your patient.  Author: Marsa KATHEE Scurry, MD 08/26/2024 4:16 PM  For on call review [email or web address].

## 2024-08-26 NOTE — Evaluation (Signed)
 Physical Therapy Evaluation Patient Details Name: Judy [name] MRN: [id number] DOB: May 07, 1951 Today's Date: 08/26/2024  History of Present Illness  Pt is a 73 y.o. female who presented 08/26/24 for elective R TKA. PMH: anxiety, aortic atherosclerosis, avascular necrosis bil hips, Bipolar 1 disorder, COPD, CAD, depression, OA, pre-diabetes, PTSD, sleep apnea  Clinical Impression  Pt presents with condition above and deficits mentioned below, see PT Problem List. PTA, she was mod I for functional mobility, living alone in a 1-level house with 4 STE. Prior to onset of her R knee pain ~1.5 years ago, she was independent without DME. She then began to use a cane when in the community and in the past 6 weeks she has been mod I using a RW inside and a rollator outside due to her R knee pain. Currently, she is demonstrating deficits in R knee ROM (extension>flexion) and sensation expected after a TKA. She also displays deficits in balance and activity tolerance due to her R knee pain with weight bearing at this time. She is currently mobilizing at a CGA-supervision level, utilizing a RW for standing mobility. She reports she has arranged 24/7 care until Sunday, 08/29/24, then states she can have someone check on her daily and potentially could arrange further 24/7 care if really needed. Educated pt to walk >/= x3 longer walks per day to tolerance and perform her HEP (plan to provide handout in next session). Educated her on elevating her R leg and icing it when resting and to never rest with anything under the knee joint itself, but rather place a pillow under the lower leg to promote knee extension at rest. She verbalized understanding. Follow physician's recommendations for discharge plan and follow up therapies. Will continue to follow acutely.       If plan is discharge home, recommend the following: A little help with bathing/dressing/bathroom;Assistance with cooking/housework;Assist for  transportation;Help with stairs or ramp for entrance   Can travel by private vehicle        Equipment Recommendations None recommended by PT  Recommendations for Other Services  OT consult    Functional Status Assessment Patient has had a recent decline in their functional status and demonstrates the ability to make significant improvements in function in a reasonable and predictable amount of time.     Precautions / Restrictions Precautions Precautions: Fall Restrictions Weight Bearing Restrictions Per Provider Order: Yes RLE Weight Bearing Per Provider Order: Weight bearing as tolerated      Mobility  Bed Mobility Overal bed mobility: Needs Assistance Bed Mobility: Supine to Sit, Sit to Supine     Supine to sit: Supervision, Used rails, HOB elevated Sit to supine: Supervision, HOB elevated, Used rails   General bed mobility comments: Supervision for safety, extra time needed to manage R leg.    Transfers Overall transfer level: Needs assistance Equipment used: Rolling walker (2 wheels) Transfers: Sit to/from Stand Sit to Stand: Contact guard assist           General transfer comment: Pt able to stand from EOB without LOB, CGA for safety    Ambulation/Gait Ambulation/Gait assistance: Contact guard assist Gait Distance (Feet): 120 Feet Assistive device: Rolling walker (2 wheels) Gait Pattern/deviations: Step-to pattern, Decreased step length - left, Decreased stance time - right, Decreased weight shift to right, Decreased stride length, Antalgic Gait velocity: reduced Gait velocity interpretation: <1.8 ft/sec, indicate of risk for recurrent falls   General Gait Details: Pt ambulates with a slow but fairly steady antalgic step-to gait  pattern. Noted decreased R weight shift and stance time due to pain with weight bearing. No LOB, CGA for safety  Stairs Stairs:  (verbally reviewed leading up with L leg and down with R leg on stairs)          Wheelchair  Mobility     Tilt Bed    Modified Rankin (Stroke Patients Only)       Balance Overall balance assessment: Needs assistance Sitting-balance support: No upper extremity supported, Feet supported Sitting balance-Leahy Scale: Good Sitting balance - Comments: able to adjust L sock, reaching off COG, without LOB while sitting EOB, supervision for safety   Standing balance support: Bilateral upper extremity supported, During functional activity, Reliant on assistive device for balance Standing balance-Leahy Scale: Poor Standing balance comment: reliant on RW                             Pertinent Vitals/Pain Pain Assessment Pain Assessment: Faces Faces Pain Scale: Hurts little more Pain Location: R knee Pain Descriptors / Indicators: Discomfort, Grimacing, Operative site guarding Pain Intervention(s): Limited activity within patient's tolerance, Monitored during session, Repositioned, Premedicated before session, Ice applied    Home Living Family/patient expects to be discharged to:: Private residence Living Arrangements: Alone Available Help at Discharge: Family;Friend(s) (24/7 until Sunday 08/29/24 then daily PRN and potentially 24/7 if really needed) Type of Home: House Home Access: Stairs to enter Entrance Stairs-Rails: Can reach both Entrance Stairs-Number of Steps: 4   Home Layout: One level Home Equipment: [employer]ant (2 wheels);Cane - single point;Rollator (4 wheels);BSC/3in1 (plans to have shower seat built-in once bathroom is done being renovated)      Prior Function Prior Level of Function : Independent/Modified Independent             Mobility Comments: For the past 6 weeks she has been mod I using a RW inside and a rollator outside, before that she was only using a cane outside for the past 1.5 years, before the R knee pain began she was independent without DME (~1.5 years ago) ADLs Comments: independent/mod I     Extremity/Trunk Assessment    Upper Extremity Assessment Upper Extremity Assessment: Defer to OT evaluation    Lower Extremity Assessment Lower Extremity Assessment: RLE deficits/detail RLE Deficits / Details: lacks ~15' knee extension AAROM, achieves ~100' knee flexion AAROM; reports diminished sensation around knee and at lower leg, but intact at foot RLE Sensation: decreased light touch    Cervical / Trunk Assessment Cervical / Trunk Assessment: Normal  Communication   Communication Communication: No apparent difficulties    Cognition Arousal: Alert Behavior During Therapy: WFL for tasks assessed/performed   PT - Cognitive impairments: No apparent impairments                         Following commands: Intact       Cueing Cueing Techniques: Verbal cues     General Comments General comments (skin integrity, edema, etc.): Educated pt to walk >/= x3 longer walks per day to tolerance and perform her HEP (plan to provide handout in next session). Educated her on elevating her R leg and icing it when resting and to never rest with anything under the knee joint itself, but rather place a pillow under the lower leg to promote knee extension at rest. She verbalized understanding.    Exercises     Assessment/Plan    PT Assessment Patient  needs continued PT services  PT Problem List Decreased strength;Decreased activity tolerance;Decreased range of motion;Decreased balance;Decreased mobility;Impaired sensation;Decreased skin integrity;Pain       PT Treatment Interventions DME instruction;Gait training;Stair training;Functional mobility training;Therapeutic activities;Therapeutic exercise;Balance training;Neuromuscular re-education;Patient/family education    PT Goals (Current goals can be found in the Care Plan section)  Acute Rehab PT Goals Patient Stated Goal: to improve PT Goal Formulation: With patient Time For Goal Achievement: 09/09/24 Potential to Achieve Goals: Good    Frequency  7X/week     Co-evaluation               AM-PAC PT 6 Clicks Mobility  Outcome Measure Help needed turning from your back to your side while in a flat bed without using bedrails?: A Little Help needed moving from lying on your back to sitting on the side of a flat bed without using bedrails?: A Little Help needed moving to and from a bed to a chair (including a wheelchair)?: A Little Help needed standing up from a chair using your arms (e.g., wheelchair or bedside chair)?: A Little Help needed to walk in hospital room?: A Little Help needed climbing 3-5 steps with a railing? : A Little 6 Click Score: 18    End of Session Equipment Utilized During Treatment: Gait belt Activity Tolerance: Patient tolerated treatment well Patient left: in bed;with call bell/phone within reach;with bed alarm set Nurse Communication: Mobility status PT Visit Diagnosis: Unsteadiness on feet (R26.81);Other abnormalities of gait and mobility (R26.89);Muscle weakness (generalized) (M62.81);Difficulty in walking, not elsewhere classified (R26.2);Pain Pain - Right/Left: Right Pain - part of body: Knee    Time: 8364-8297 PT Time Calculation (min) (ACUTE ONLY): 27 min   Charges:   PT Evaluation $PT Eval Low Complexity: 1 Low PT Treatments $Therapeutic Activity: 8-22 mins PT General Charges $$ ACUTE PT VISIT: 1 Visit         Theo Ferretti, PT, DPT Acute Rehabilitation Services  Office: [phone]   Theo CHRISTELLA Ferretti 08/26/2024, 5:23 PM

## 2024-08-26 NOTE — Brief Op Note (Signed)
 08/26/2024  2:45 PM  PATIENT:  Judy [name]  73 y.o. female  PRE-OPERATIVE DIAGNOSIS:  Osteonecrosis right knee with osteochondral fracture of the medial femoral condyle  POST-OPERATIVE DIAGNOSIS:  Osteonecrosis right knee with osteochondral fracture of the medial femoral condyle  PROCEDURE:  Procedure(s): ARTHROPLASTY, KNEE, TOTAL (Right) Right knee bone biopsy  SURGEON:  Surgeons and Role:    * Ervie Mccard, Rankin ORN, MD - Primary  PHYSICIAN ASSISTANT: Arvella Fireman, PA-C  ASSISTANTS: Magdalene Fireman, RFNA   ANESTHESIA:   local, regional, spinal, and general  EBL:  10 mL   BLOOD ADMINISTERED:none  DRAINS: none   LOCAL MEDICATIONS USED:  BUPIVICAINE  and OTHER toradol, clonidine, vancomycin  SPECIMEN:  Biopsy / Limited Resection medial femoral condyle  DISPOSITION OF SPECIMEN:  PATHOLOGY  COUNTS:  YES  TOURNIQUET:   Total Tourniquet Time Documented: Thigh (Right) - 94 minutes Total: Thigh (Right) - 94 minutes   DICTATION: .Note written in EPIC  PLAN OF CARE: Admit for observation  PATIENT DISPOSITION:  PACU - hemodynamically stable.   Delay start of Pharmacological VTE agent (>24hrs) due to surgical blood loss or risk of bleeding: no

## 2024-08-26 NOTE — Anesthesia Procedure Notes (Signed)
 Anesthesia Regional Block: Adductor canal block   Pre-Anesthetic Checklist: , timeout performed,  Correct Patient, Correct Site, Correct Laterality,  Correct Procedure, Correct Position, site marked,  Risks and benefits discussed,  Surgical consent,  Pre-op evaluation,  At surgeon's request and post-op pain management  Laterality: Right  Prep: chloraprep       Needles:  Injection technique: Single-shot  Needle Type: Echogenic Stimulator Needle     Needle Length: 9cm  Needle Gauge: 21     Additional Needles:   Procedures:,,,, ultrasound used (permanent image in chart),,    Narrative:  Start time: 08/26/2024 10:35 AM End time: 08/26/2024 10:40 AM Injection made incrementally with aspirations every 5 mL.  Performed by: Personally  Anesthesiologist: Peggye Delon Brunswick, MD  Additional Notes: Discussed risks and benefits of nerve block including, but not limited to, prolonged and/or permanent nerve injury involving sensory and/or motor function. Monitors were applied and a time-out was performed. The nerve and associated structures were visualized under ultrasound guidance. After negative aspiration, local anesthetic was slowly injected around the nerve. There was no evidence of high pressure during the procedure. There were no paresthesias. VSS remained stable and the patient tolerated the procedure well.

## 2024-08-26 NOTE — Transfer of Care (Signed)
 Immediate Anesthesia Transfer of Care Note  [name]  Procedure(s) Performed: ARTHROPLASTY, KNEE, TOTAL (Right: Knee)  Patient Location: PACU  Anesthesia Type:Spinal  Level of Consciousness: drowsy and patient cooperative  Airway & Oxygen Therapy: Patient Spontanous Breathing  Post-op Assessment: Report given to RN and Post -op Vital signs reviewed and stable  Post vital signs: Reviewed and stable  Last Vitals:  Vitals Value Taken Time  BP 109/80 08/26/24 15:09  Temp    Pulse 71 08/26/24 15:10  Resp 10 08/26/24 15:10  SpO2 98 % 08/26/24 15:10  Vitals shown include unfiled device data.  Last Pain:  Vitals:   08/26/24 1019  TempSrc:   PainSc: 3          Complications: No notable events documented.

## 2024-08-27 ENCOUNTER — Other Ambulatory Visit: Payer: Self-pay | Admitting: Family Medicine

## 2024-08-27 ENCOUNTER — Other Ambulatory Visit (HOSPITAL_COMMUNITY): Payer: Self-pay

## 2024-08-27 ENCOUNTER — Encounter (HOSPITAL_COMMUNITY): Payer: Self-pay

## 2024-08-27 DIAGNOSIS — F419 Anxiety disorder, unspecified: Secondary | ICD-10-CM | POA: Diagnosis not present

## 2024-08-27 DIAGNOSIS — F4310 Post-traumatic stress disorder, unspecified: Secondary | ICD-10-CM | POA: Diagnosis not present

## 2024-08-27 DIAGNOSIS — E119 Type 2 diabetes mellitus without complications: Secondary | ICD-10-CM

## 2024-08-27 DIAGNOSIS — G2581 Restless legs syndrome: Secondary | ICD-10-CM

## 2024-08-27 DIAGNOSIS — F319 Bipolar disorder, unspecified: Secondary | ICD-10-CM | POA: Diagnosis not present

## 2024-08-27 DIAGNOSIS — I2510 Atherosclerotic heart disease of native coronary artery without angina pectoris: Secondary | ICD-10-CM | POA: Diagnosis not present

## 2024-08-27 DIAGNOSIS — J449 Chronic obstructive pulmonary disease, unspecified: Secondary | ICD-10-CM | POA: Diagnosis not present

## 2024-08-27 DIAGNOSIS — M45 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: Secondary | ICD-10-CM

## 2024-08-27 DIAGNOSIS — Z96659 Presence of unspecified artificial knee joint: Secondary | ICD-10-CM | POA: Diagnosis not present

## 2024-08-27 DIAGNOSIS — M87851 Other osteonecrosis, right femur: Secondary | ICD-10-CM | POA: Diagnosis not present

## 2024-08-27 LAB — BASIC METABOLIC PANEL WITH GFR
Anion gap: 12 (ref 5–15)
BUN: 12 mg/dL (ref 8–23)
CO2: 21 mmol/L — ABNORMAL LOW (ref 22–32)
Calcium: 9.1 mg/dL (ref 8.9–10.3)
Chloride: 104 mmol/L (ref 98–111)
Creatinine, Ser: 0.96 mg/dL (ref 0.44–1.00)
GFR, Estimated: >60 mL/min (ref >60)
Glucose, Bld: 173 mg/dL — ABNORMAL HIGH (ref 70–99)
Potassium: 4.1 mmol/L (ref 3.5–5.1)
Sodium: 137 mmol/L (ref 135–145)

## 2024-08-27 LAB — HEPATIC FUNCTION PANEL
ALT: 15 U/L (ref 0–44)
AST: 19 U/L (ref 15–41)
Albumin: 2.7 g/dL — ABNORMAL LOW (ref 3.5–5.0)
Alkaline Phosphatase: 114 U/L (ref 38–126)
Bilirubin, Direct: <0.1 mg/dL (ref 0.0–0.2)
Total Bilirubin: 0.3 mg/dL (ref 0.0–1.2)
Total Protein: 5.5 g/dL — ABNORMAL LOW (ref 6.5–8.1)

## 2024-08-27 LAB — PHOSPHORUS: Phosphorus: 3.3 mg/dL (ref 2.5–4.6)

## 2024-08-27 LAB — CBC
HCT: 29.9 % — ABNORMAL LOW (ref 36.0–46.0)
Hemoglobin: 9.4 g/dL — ABNORMAL LOW (ref 12.0–15.0)
MCH: 28.3 pg (ref 26.0–34.0)
MCHC: 31.4 g/dL (ref 30.0–36.0)
MCV: 90.1 fL (ref 80.0–100.0)
Platelets: 229 K/uL (ref 150–400)
RBC: 3.32 MIL/uL — ABNORMAL LOW (ref 3.87–5.11)
RDW: 13.4 % (ref 11.5–15.5)
WBC: 11 K/uL — ABNORMAL HIGH (ref 4.0–10.5)
nRBC: 0 % (ref 0.0–0.2)

## 2024-08-27 LAB — MAGNESIUM: Magnesium: 1.7 mg/dL (ref 1.7–2.4)

## 2024-08-27 MED ORDER — SENNA 8.6 MG PO TABS: 1.0000 | ORAL_TABLET | Freq: Two times a day (BID) | ORAL | 0 refills | Status: AC

## 2024-08-27 MED ORDER — ACETAMINOPHEN 500 MG PO TABS: 1000.0000 mg | ORAL_TABLET | Freq: Three times a day (TID) | ORAL | 0 refills | Status: AC

## 2024-08-27 MED ORDER — ASPIRIN 81 MG PO CHEW: 81.0000 mg | CHEWABLE_TABLET | Freq: Two times a day (BID) | ORAL | 1 refills | Status: AC

## 2024-08-27 MED ORDER — OXYCODONE HCL 5 MG PO TABS: 5.0000 mg | ORAL_TABLET | ORAL | 0 refills | Status: AC | PRN

## 2024-08-27 MED ORDER — DOCUSATE SODIUM 100 MG PO CAPS: 100.0000 mg | ORAL_CAPSULE | Freq: Two times a day (BID) | ORAL | 0 refills | Status: AC

## 2024-08-27 NOTE — Evaluation (Signed)
 Occupational Therapy Evaluation and Discharge Patient Details Name: Judy [name] MRN: [id number] DOB: 12-16-1950 Today's Date: 08/27/2024   History of Present Illness   Pt is a 73 y.o. female who presented 08/26/24 for elective R TKA. PMH: anxiety, aortic atherosclerosis, avascular necrosis bil hips, Bipolar 1 disorder, COPD, CAD, depression, OA, pre-diabetes, PTSD, sleep apnea     Clinical Impressions This 73 yo female admitted and underwent above presents to acute OT with all education completed, we will D/C from acute OT.      Functional Status Assessment   Patient has had a recent decline in their functional status and demonstrates the ability to make significant improvements in function in a reasonable and predictable amount of time. (without any further OT needs)     Equipment Recommendations   None recommended by OT      Precautions/Restrictions   Precautions Precautions: Knee;Fall Recall of Precautions/Restrictions: Intact Restrictions Weight Bearing Restrictions Per Provider Order: No RLE Weight Bearing Per Provider Order: Weight bearing as tolerated     Mobility Bed Mobility               General bed mobility comments: Pt up in recliner            ADL either performed or assessed with clinical judgement   ADL                                         General ADL Comments: Discussed about how with a fold down shower seat that it may be too far away for her to reach the water  turn off/on/adjust while seated so may want to consider using her other shower seat (portable) to have it closer to the shower control(s) and a lower handheld shower head holder that she can easily get to when seated. Educated on when getting dressed to put RLE in underwear and pants first to make getting dressing easier--she stated she had already been doing that due to pain in her knee prior to surgery. She very rarely wears socks--goes barefoot in  house or has slip on shoes. She has a 3n1 she can use over toilet at home. She also has a walker tray so she can move items around without trying to hold onto them while also walking with RW (which is not recommended from a safety standpoint).     Vision Patient Visual Report: No change from baseline              Pertinent Vitals/Pain Pain Assessment Pain Assessment: Faces Faces Pain Scale: Hurts a little bit Pain Location: R knee--rubbing knee as she sat eating her breakfast Pain Descriptors / Indicators: Operative site guarding, Discomfort, Tightness Pain Intervention(s): Monitored during session     Extremity/Trunk Assessment Upper Extremity Assessment Upper Extremity Assessment: Overall WFL for tasks assessed           Communication Communication Communication: No apparent difficulties   Cognition Arousal: Alert Behavior During Therapy: WFL for tasks assessed/performed                                 Following commands: Intact       Cueing  General Comments   Cueing Techniques: Verbal cues  Educated pt on R TKA HEP and provided handout. Reviewed and demonstrated each exercise. Instructed her to complete 10 reps 2-3x/day  and to ambulate frequently with staff assistance.           Home Living Family/patient expects to be discharged to:: Private residence Living Arrangements: Alone Available Help at Discharge: Family;Friend(s) (24/7 until Sunday 08/29/24 then daily PRN and potentially 24/7 if really needed) Type of Home: House Home Access: Stairs to enter [employer] of Steps: 4 Entrance Stairs-Rails: Can reach both Home Layout: One level     Bathroom Shower/Tub: Walk-in shower (currently being renovated with a fold down shower seat in the shower)   Bathroom Toilet: Standard     Home Equipment: Agricultural Consultant (2 wheels);Cane - single point;Rollator (4 wheels);BSC/3in1          Prior Functioning/Environment Prior Level of  Function : Independent/Modified Independent             Mobility Comments: For the past 6 weeks she has been mod I using a RW inside and a rollator outside, before that she was only using a cane outside for the past 1.5 years, before the R knee pain began she was independent without DME (~1.5 years ago) ADLs Comments: independent/mod I    OT Problem List: Decreased range of motion;Decreased strength;Impaired balance (sitting and/or standing);Pain        OT Goals(Current goals can be found in the care plan section)   Acute Rehab OT Goals Patient Stated Goal: to go home today         AM-PAC OT 6 Clicks Daily Activity     Outcome Measure Help from another person eating meals?: None Help from another person taking care of personal grooming?: A Little Help from another person toileting, which includes using toliet, bedpan, or urinal?: A Little Help from another person bathing (including washing, rinsing, drying)?: A Little Help from another person to put on and taking off regular upper body clothing?: A Little Help from another person to put on and taking off regular lower body clothing?: A Little 6 Click Score: 19   End of Session    Activity Tolerance: Patient tolerated treatment well Patient left: in chair;with call bell/phone within reach (continuing to eat her breakfast)  OT Visit Diagnosis: Muscle weakness (generalized) (M62.81);Pain Pain - Right/Left: Right Pain - part of body: Knee                Time: 0915-0926 OT Time Calculation (min): 11 min Charges:  OT General Charges $OT Visit: 1 Visit OT Evaluation $OT Eval Low Complexity: 1 Low  Donny BECKER OT Acute Rehabilitation Services Office [phone]    Judy [name], Judy [name] 08/27/2024, 10:03 AM

## 2024-08-27 NOTE — Hospital Course (Addendum)
 Judy [name] is a 73 y.o. female with medical history significant of hyperlipidemia, diabetes, CAD, anxiety, PTSD, bipolar disorder, RLS, COPD, ankylosing spondylitis presenting for scheduled right total knee arthroplasty in the setting of osteonecrosis and osteochondral fracture.   Patient has known history of osteoporosis and osteochondral fracture for which she follows with orthopedic surgery.  Presents today for scheduled right total knee arthroplasty.  Underwent uncomplicated surgical intervention.   Hospitalist service asked to consult to assist with management of comorbid conditions.   Patient reports feeling okay with some pain after surgery. Patient not aware of her diabetes, though she was receiving diabetes medicines for weight loss only.   Patient denies fevers, chills, chest pain, shortness of breath, abdominal pain, constipation, diarrhea, nausea, vomiting.  Assessment and Plan:  Status post total knee replacement > Presented for scheduled right total knee arthroplasty in the setting of osteonecrosis and osteochondral fracture. > Underwent successful surgical intervention. - Management per primary team - Is receiving pain medication   Diabetes Mellitus Type 2 > Patient was unaware of diabetes diagnosis.  Thought she did not have it and that she was taking medicine for weight loss only.  Told her I would look into it. > Chart review indicates patient did have elevated A1c to 6.61-year ago and has been slowly improving with 5.8 earlier this year and improved to 5.3 yesterday.  Will need to be communicated with her. - SSI while admitted   Hyperlipidemia - Continue home atorvastatin    CAD - Continue home atorvastatin    Anxiety PTSD Bipolar disorder - Continue home bupropion  and quetiapine    COPD - Replacement Trelegy with formulary Breztri  - Continue albuterol  as needed  Leukocytosis: Mild. WBC went from 5.5 -> 11.0. CTM    Normocytic Anemia: Hgb/Hct Trend:   Recent Labs  Lab 08/25/24 1202 08/27/24 0425  HGB 11.6* 9.4*  HCT 36.9 29.9*  MCV 89.6 90.1  -Check Anemia Panel in the AM. CTM for S/Sx of Bleeding; No overt bleeding noted -Repeat CBC within 1 week   Hypoalbuminemia: Patient's Albumin Lvl is now 2.7. CTM & Trend & Repeat CMP in the AM  Overweight: Complicates overall prognosis and care. Estimated body mass index is 28.84 kg/m as calculated from the following:   Height as of this encounter: 5' 10 (1.778 m).   Weight as of this encounter: 91.2 kg. Weight Loss and Dietary Counseling given

## 2024-08-27 NOTE — TOC Initial Note (Signed)
 Transition of Care [facility]) - Initial/Assessment Note    Patient Details  Name: Judy [name] MRN: [id number] Date of Birth: 1950/12/18  [facility] CM/SW Contact:    Nola Devere Hands, RN Phone Number: 08/27/2024, 12:42 PM  Clinical Narrative:                 Case Manager left message with dr. Joleen scheduler regarding preop arrangements for Home Health. CM or office will followup with patient with name of agency. Patient has necessary DME.         Patient Goals and CMS Choice            Expected Discharge Plan and Services         Expected Discharge Date: 08/27/24                                    Prior Living Arrangements/Services                       Activities of Daily Living   ADL Screening (condition at time of admission) Independently performs ADLs?: Yes (appropriate for developmental age) Is the patient deaf or have difficulty hearing?: No Does the patient have difficulty seeing, even when wearing glasses/contacts?: No Does the patient have difficulty concentrating, remembering, or making decisions?: Yes (situational in the past month)  Permission Sought/Granted                  Emotional Assessment              Admission diagnosis:  Primary osteoarthritis of right knee [M17.11] Osteochondral fracture [T14.8XXA] Status post total knee replacement [Z96.659] Patient Active Problem List   Diagnosis Date Noted   Status post total knee replacement 08/26/2024   Claudication 08/04/2024   Coronary artery disease involving native coronary artery of native heart without angina pectoris 03/08/2024   Aortic atherosclerosis 03/08/2024   Unilateral primary osteoarthritis, right knee 12/31/2023   Hyperlipidemia 07/04/2023   Diabetes mellitus without complication (HCC) 08/09/2022   Restless legs syndrome 08/09/2022   Status post total left knee replacement 12/28/2021   Primary osteoarthritis of left knee  10/25/2021   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) 11/30/2020   Status post total replacement of right hip 06/04/2019   Status post total replacement of left hip 03/19/2019   PTSD (post-traumatic stress disorder)    COPD (chronic obstructive pulmonary disease) (HCC)    Bipolar 1 disorder (HCC)    Anxiety    B12 deficiency 12/09/2018   Hyperglycemia 04/14/2017   PCP:  Ozell Heron HERO, MD Pharmacy:   [facility] [address] [place] - [address] [place] [address] Phone: [phone] Fax: [phone]     Social Drivers of Health (SDOH) Social History: SDOH Screenings   Food Insecurity: No Food Insecurity (08/26/2024)  Housing: Low Risk  (08/26/2024)  Transportation Needs: No Transportation Needs (08/26/2024)  Utilities: Not At Risk (08/26/2024)  Alcohol Screen: Low Risk  (03/31/2024)  Depression (PHQ2-9): Low Risk  (05/18/2024)  Financial Resource Strain: Low Risk  (03/31/2024)  Physical Activity: Inactive (03/31/2024)  Social Connections: Moderately Isolated (08/26/2024)  Stress: No Stress Concern Present (03/31/2024)  Tobacco Use: Medium Risk (08/25/2024)  Health Literacy: Adequate Health Literacy (03/31/2024)   SDOH Interventions:     Readmission Risk Interventions     No data to display

## 2024-08-27 NOTE — Anesthesia Postprocedure Evaluation (Signed)
 Anesthesia Post Note  [name]  Procedure(s) Performed: ARTHROPLASTY, KNEE, TOTAL (Right: Knee)     Patient location during evaluation: PACU Anesthesia Type: MAC and Spinal Level of consciousness: awake Pain management: pain level controlled Vital Signs Assessment: post-procedure vital signs reviewed and stable Respiratory status: spontaneous breathing, respiratory function stable and nonlabored ventilation Cardiovascular status: blood pressure returned to baseline and stable Postop Assessment: no headache, no backache and no apparent nausea or vomiting Anesthetic complications: no   No notable events documented.               Delon Aisha Arch

## 2024-08-27 NOTE — Progress Notes (Signed)
 Physical Therapy Treatment Patient Details Name: Judy [name] MRN: [id number] DOB: 04-Mar-1951 Today's Date: 08/27/2024   History of Present Illness Pt is a 73 y.o. female who presented 08/26/24 for elective R TKA. PMH: anxiety, aortic atherosclerosis, avascular necrosis bil hips, Bipolar 1 disorder, COPD, CAD, depression, OA, pre-diabetes, PTSD, sleep apnea    PT Comments  Today's session focused on gait and stair training. Educated pt on proper and safe technique. She ambulated ~115ft using RW. Pt demonstrated an antalgic gait pattern, initially step-to advancing to step-through with VC/TC. She relies on BUE support on RW to offload RLE d/t pain. Pt verbalized that she was supposed to ascend with her LLE and descend with her RLE when completing stairs. She performed 2 standard height steps 4 times using bilateral rails. Overall, pt required SBA with OOB mobility. Provided pt with R TKA HEP handout and had her perform a few exercises supine in bed to demonstrate understanding. Pt feels ready and safe for discharge home. I have answered all her questions related to mobility.      If plan is discharge home, recommend the following: A little help with bathing/dressing/bathroom;Assistance with cooking/housework;Assist for transportation;Help with stairs or ramp for entrance   Can travel by private vehicle        Equipment Recommendations  None recommended by PT    Recommendations for Other Services       Precautions / Restrictions Precautions Precautions: Knee;Fall Precaution Booklet Issued: Yes (comment) Recall of Precautions/Restrictions: Intact Restrictions Weight Bearing Restrictions Per Provider Order: Yes RLE Weight Bearing Per Provider Order: Weight bearing as tolerated     Mobility  Bed Mobility Overal bed mobility: Modified Independent             General bed mobility comments: Pt performed supine<>sit from a flat bed with use of bed rail. She took increased time  to sit EOB on R and return to bed managing BLE and repositioning herself.    Transfers Overall transfer level: Needs assistance Equipment used: Rolling walker (2 wheels) Transfers: Sit to/from Stand Sit to Stand: Supervision           General transfer comment: Pt demonstrated proper hand placement using RW. She powered up without physical assist. Cued pt to scoot to edge of surface to increase B knee flex. She demonstrated good forward lean. Pt achieved upright posture. Fair eccentric control, reaching back for surface.    Ambulation/Gait Ambulation/Gait assistance: Contact guard assist, Supervision Gait Distance (Feet): 150 Feet Assistive device: Rolling walker (2 wheels) Gait Pattern/deviations: Step-to pattern, Decreased step length - left, Decreased stance time - right, Decreased weight shift to right, Decreased stride length, Antalgic, Step-through pattern Gait velocity: reduced Gait velocity interpretation: <1.8 ft/sec, indicate of risk for recurrent falls   General Gait Details: Pt ambulated with short slow steps with heavy reliance on BUE support on RW to offload LEs. Pt self-limited weight acceptance on RLE d/t pain. She maintained upright posture and good proximity to RW. Cues for increased step length and foot clearence. Pt was able to adjust, but reports typically using a step-to gait pattern at home. No LOB. She navigated room, hallway, and bathroom well.   Stairs Stairs: Yes Stairs assistance: Contact guard assist, Supervision Stair Management: Two rails, Forwards, Step to pattern Number of Stairs: 2 (x4) General stair comments: Pt recalled education from yesterday to ascend with LLE and descend with RLE one step at a time. She completed 2 steps 4 times in accordance with her home enviornment.  Pt utilized bilateral rails. She demonstrated limited R knee flexion.   Wheelchair Mobility     Tilt Bed    Modified Rankin (Stroke Patients Only)       Balance  Overall balance assessment: Needs assistance Sitting-balance support: No upper extremity supported, Feet supported Sitting balance-Leahy Scale: Good Sitting balance - Comments: Pt completed pericare independently in bathroom.   Standing balance support: Bilateral upper extremity supported, During functional activity, Reliant on assistive device for balance Standing balance-Leahy Scale: Fair Standing balance comment: Pt dependent on RW for ambulation. She washed hands standing at sink.                            Communication Communication Communication: No apparent difficulties  Cognition Arousal: Alert Behavior During Therapy: WFL for tasks assessed/performed   PT - Cognitive impairments: No apparent impairments                       PT - Cognition Comments: PT A,Ox4 Following commands: Intact      Cueing Cueing Techniques: Verbal cues, Visual cues  Exercises Total Joint Exercises Ankle Circles/Pumps: Supine, Both, AROM, 10 reps Quad Sets: Supine, Both, AROM, 10 reps, Strengthening (Hold for 5-10 seconds.) Heel Slides: Supine, Right, AROM, 10 reps Goniometric ROM: Supine R Knee AROM ~45deg of flexion.    General Comments General comments (skin integrity, edema, etc.): Educated pt on R TKA HEP and provided handout. Reviewed and demonstrated each exercise. Instructed her to complete 10 reps 2-3x/day and to ambulate frequently with staff assistance.      Pertinent Vitals/Pain Pain Assessment Pain Assessment: Faces Faces Pain Scale: Hurts little more Pain Location: R knee Pain Descriptors / Indicators: Operative site guarding, Discomfort, Tightness Pain Intervention(s): Monitored during session, Limited activity within patient's tolerance, Repositioned    Home Living                          Prior Function            PT Goals (current goals can now be found in the care plan section) Acute Rehab PT Goals Patient Stated Goal: Regain  independence PT Goal Formulation: With patient Time For Goal Achievement: 09/09/24 Potential to Achieve Goals: Good Progress towards PT goals: Progressing toward goals    Frequency    7X/week      PT Plan      Co-evaluation              AM-PAC PT 6 Clicks Mobility   Outcome Measure  Help needed turning from your back to your side while in a flat bed without using bedrails?: A Little Help needed moving from lying on your back to sitting on the side of a flat bed without using bedrails?: A Little Help needed moving to and from a bed to a chair (including a wheelchair)?: A Little Help needed standing up from a chair using your arms (e.g., wheelchair or bedside chair)?: A Little Help needed to walk in hospital room?: A Little Help needed climbing 3-5 steps with a railing? : A Little 6 Click Score: 18    End of Session Equipment Utilized During Treatment: Gait belt Activity Tolerance: Patient tolerated treatment well Patient left: in bed;with call bell/phone within reach Nurse Communication: Mobility status PT Visit Diagnosis: Unsteadiness on feet (R26.81);Other abnormalities of gait and mobility (R26.89);Muscle weakness (generalized) (M62.81);Difficulty in walking, not elsewhere classified (R26.2);Pain Pain -  Right/Left: Right Pain - part of body: Knee     Time: 9265-9192 PT Time Calculation (min) (ACUTE ONLY): 33 min  Charges:    $Gait Training: 23-37 mins PT General Charges $$ ACUTE PT VISIT: 1 Visit                     Randall SAUNDERS, PT, DPT Acute Rehabilitation Services Office: [phone] Secure Chat Preferred  Delon CHRISTELLA Callander 08/27/2024, 9:16 AM

## 2024-08-27 NOTE — Progress Notes (Signed)
   Subjective:  73 y.o. female now POD 1 from right total knee arthroplasty. Patient reports her pain is well controlled and she did well yesterday with physical therapy.  Otherwise, no acute events overnight. No numbness or tingling to the extremity. No fevers or chills  Objective:   VITALS:   Vitals:   08/26/24 2029 08/27/24 0343 08/27/24 0810 08/27/24 0814  BP:  118/72  110/68  Pulse:  64 65 71  Resp:   18 16  Temp:  98.2 F (36.8 C)  97.6 F (36.4 C)  TempSrc:    Oral  SpO2: 96% 98%  94%  Weight:      Height:        Physical Exam: General: Alert, no acute distress Cardiovascular: No pedal edema Respiratory: No cyanosis, no use of accessory musculature GI: No organomegaly, abdomen is soft and non-tender Skin: No lesions in the area of chief complaint Neurologic: Sensation intact distally Psychiatric: Patient is competent for consent with normal mood and affect Lymphatic: No axillary or cervical lymphadenopathy  MUSCULOSKELETAL: Right lower extremity - Dressing clean/dry and intact - TTP peri incisionally - Motor intact to right knee flexion/extension, ankle dorsiflexion/plantarflexion - Sensation intact to light touch sural, saphenous, tibial, deep and superficial peroneal nerve distributions - 2+ DP pulse  Lab Results  Component Value Date   WBC 11.0 (H) 08/27/2024   HGB 9.4 (L) 08/27/2024   HCT 29.9 (L) 08/27/2024   MCV 90.1 08/27/2024   PLT 229 08/27/2024   BMET    Component Value Date/Time   NA 137 08/27/2024 0425   K 4.1 08/27/2024 0425   CL 104 08/27/2024 0425   CO2 21 (L) 08/27/2024 0425   GLUCOSE 173 (H) 08/27/2024 0425   BUN 12 08/27/2024 0425   CREATININE 0.96 08/27/2024 0425   CREATININE 1.15 (H) 09/10/2023 1515   CALCIUM  9.1 08/27/2024 0425   EGFR 51 (L) 09/10/2023 1515   GFRNONAA >60 08/27/2024 0425   GFRNONAA 47 (L) 04/11/2021 1419     Assessment/Plan: 73 y.o. 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  Patient is doing well POD 1 from right total knee arthroplasty for right knee osteonecrosis with fracture and medial femoral condyle collapse. The patient may continue to take oxycodone  as needed for pain along with tylenol . She will work with physical therapy with an anticipated discharge today. She does have acute blood loss anemia as seen with a greater than 2 g hgb drop, but remains assymptomatic at this time.  Weight bearing: as tolerated right lower extremity Pain control: oxy, tylenol  PT/OT DVT ppx: ASA 81 mg BID x 6 weeks Bowel regimen: docusate, senna Dispo: home today with home health due to difficulty with safe transportation for the patient   Rankin LELON Pizza 08/27/2024, 10:20 AM

## 2024-08-27 NOTE — Telephone Encounter (Unsigned)
 Copied from CRM [id number]. Topic: Clinical - Medication Refill >> Aug 27, 2024 11:35 AM Suzen RAMAN wrote: Medication: tirzepatide  (MOUNJARO ) 5 MG/0.5ML Pen  *patient requesting medication be placed on auto fill *  Has the patient contacted their pharmacy? Yes   This is the patient's preferred pharmacy:  [facility] [address] [place] - [address] [place] [address] Phone: [phone] Fax: [phone]  Is this the correct pharmacy for this prescription? Yes If no, delete pharmacy and type the correct one.   Has the prescription been filled recently? No  Is the patient out of the medication? Yes  Has the patient been seen for an appointment in the last year OR does the patient have an upcoming appointment? Yes  Can we respond through MyChart? Yes  Agent: Please be advised that Rx refills may take up to 3 business days. We ask that you follow-up with your pharmacy.

## 2024-08-27 NOTE — Progress Notes (Signed)
 Patient is discharging to home via private transportation, AVS reviewed at bedside, IV access removed, no further questions.

## 2024-08-27 NOTE — Discharge Summary (Signed)
 Patient ID: Judy [name] MRN: [id number] DOB/AGE: 12/03/1950 73 y.o.  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   Bipolar 1 disorder (HCC)    Bronchitis    hx   COPD (chronic obstructive pulmonary disease) (HCC)    Coronary artery disease 08/04/2024   Depression    Dyspnea    with exertion   Osteoarthritis 03/10/2024   Pneumonia    Pre-diabetes    PTSD (post-traumatic stress disorder)    Sleep apnea 12/2023   mild, no CPAP   Uses walker    Discharge Diagnoses:   Principal Problem:   Status post total knee replacement Active Problems:   PTSD (post-traumatic stress disorder)   COPD (chronic obstructive pulmonary disease) (HCC)   Bipolar 1 disorder (HCC)   Anxiety   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)   Diabetes mellitus without complication (HCC)   Restless legs syndrome   Hyperlipidemia   Coronary artery disease involving native coronary artery of native heart without angina pectoris  Estimated body mass index is 28.84 kg/m as calculated from the following:   Height as of this encounter: 5' 10 (1.778 m).   Weight as of this encounter: 91.2 kg.  Procedure:  Procedure(s) (LRB): ARTHROPLASTY, KNEE, TOTAL (Right)   Consults: Internal medicine  Laboratory Data: Admission on 08/26/2024  Component Date Value Ref Range Status   Glucose-Capillary 08/26/2024 125 (H)  70 - 99 mg/dL Final   Glucose reference range applies only to samples taken after fasting for at least 8 hours.   WBC 08/27/2024 11.0 (H)  4.0 - 10.5 K/uL Final   RBC 08/27/2024 3.32 (L)  3.87 - 5.11 MIL/uL  Final   Hemoglobin 08/27/2024 9.4 (L)  12.0 - 15.0 g/dL Final   HCT 88/78/7974 29.9 (L)  36.0 - 46.0 % Final   MCV 08/27/2024 90.1  80.0 - 100.0 fL Final   MCH 08/27/2024 28.3  26.0 - 34.0 pg Final   MCHC 08/27/2024 31.4  30.0 - 36.0 g/dL Final   RDW 88/78/7974 13.4  11.5 - 15.5 % Final   Platelets 08/27/2024 229  150 - 400 K/uL Final   nRBC 08/27/2024 0.0  0.0 - 0.2 % Final   Performed at [facility], [address] [place], [place] [address]   Sodium 08/27/2024 137  135 - 145 mmol/L Final   Potassium 08/27/2024 4.1  3.5 - 5.1 mmol/L Final   Chloride 08/27/2024 104  98 - 111 mmol/L Final   CO2 08/27/2024 21 (L)  22 - 32 mmol/L Final   Glucose, Bld 08/27/2024 173 (H)  70 - 99 mg/dL Final   Glucose reference range applies only to samples taken after fasting for at least 8 hours.   BUN 08/27/2024 12  8 - 23 mg/dL Final   Creatinine, Ser 08/27/2024 0.96  0.44 - 1.00 mg/dL Final   Calcium  08/27/2024 9.1  8.9 - 10.3 mg/dL Final   GFR, Estimated 08/27/2024 >60  >60 mL/min Final   Comment: (NOTE) Calculated using the CKD-EPI Creatinine Equation (2021)    Anion gap 08/27/2024 12  5 -  15 Final   Performed at [facility], [address] [place], [place] [address]  Hospital Outpatient Visit on 08/25/2024  Component Date Value Ref Range Status   WBC 08/25/2024 5.5  4.0 - 10.5 K/uL Final   RBC 08/25/2024 4.12  3.87 - 5.11 MIL/uL Final   Hemoglobin 08/25/2024 11.6 (L)  12.0 - 15.0 g/dL Final   HCT 88/80/7974 36.9  36.0 - 46.0 % Final   MCV 08/25/2024 89.6  80.0 - 100.0 fL Final   MCH 08/25/2024 28.2  26.0 - 34.0 pg Final   MCHC 08/25/2024 31.4  30.0 - 36.0 g/dL Final   RDW 88/80/7974 13.5  11.5 - 15.5 % Final   Platelets 08/25/2024 266  150 - 400 K/uL Final   nRBC 08/25/2024 0.0  0.0 - 0.2 % Final   Performed at [facility], [address], [place], [place] [address]   Sodium 08/25/2024 139  135 - 145 mmol/L Final   Potassium 08/25/2024 3.9  3.5 - 5.1 mmol/L Final    Chloride 08/25/2024 104  98 - 111 mmol/L Final   CO2 08/25/2024 23  22 - 32 mmol/L Final   Glucose, Bld 08/25/2024 90  70 - 99 mg/dL Final   Glucose reference range applies only to samples taken after fasting for at least 8 hours.   BUN 08/25/2024 13  8 - 23 mg/dL Final   Creatinine, Ser 08/25/2024 1.12 (H)  0.44 - 1.00 mg/dL Final   Calcium  08/25/2024 9.9  8.9 - 10.3 mg/dL Final   GFR, Estimated 08/25/2024 52 (L)  >60 mL/min Final   Comment: (NOTE) Calculated using the CKD-EPI Creatinine Equation (2021)    Anion gap 08/25/2024 12  5 - 15 Final   Performed at [facility], [address], [place], [place] [address]   Hgb A1c MFr Bld 08/25/2024 5.3  4.8 - 5.6 % Final   Comment: (NOTE) Diagnosis of Diabetes The following HbA1c ranges recommended by the American Diabetes Association (ADA) may be used as an aid in the diagnosis of diabetes mellitus.  Hemoglobin             Suggested A1C NGSP%              Diagnosis  <5.7                   Non Diabetic  5.7-6.4                Pre-Diabetic  >6.4                   Diabetic  <7.0                   Glycemic control for                       adults with diabetes.     Mean Plasma Glucose 08/25/2024 105.41  mg/dL Final   Performed at [facility], [address], [place], [place] [address]   MRSA, PCR 08/25/2024 NEGATIVE  NEGATIVE Final   Staphylococcus aureus 08/25/2024 POSITIVE (A)  NEGATIVE Final   Comment: (NOTE) The Xpert SA Assay (FDA approved for NASAL specimens in patients 72 years of age and older), is one component of a comprehensive surveillance program. It is not intended to diagnose infection nor to guide or monitor treatment. Performed at [facility], [address] [place], [place] [address]  Hospital Outpatient Visit on 08/13/2024  Component Date Value Ref Range Status   Right ABI 08/13/2024 1.22   Final   Left ABI 08/13/2024 1.38   Final  Appointment on 07/29/2024  Component Date Value Ref  Range Status   Area-P 1/2 07/29/2024 3.72  cm2 Final   S' Lateral 07/29/2024 2.29  cm Final   Est EF 07/29/2024 60 - 65%   Final     X-Rays:VAS US  ABI WITH/WO TBI Result Date: 08/13/2024  LOWER EXTREMITY DOPPLER STUDY Patient Name:  ADALIAH HIEGEL  Date of Exam:   08/13/2024 Medical Rec #: [id number]            Accession #:    [id number] Date of Birth: Jul 14, 1951            Patient Gender: F Patient Age:   16 years Exam Location:  [address] Procedure:      VAS US  ABI WITH/WO TBI Referring Phys: [facility] PATWARDHAN --------------------------------------------------------------------------------  Indications: Unable to pass catheter/wire in pedal arteries for genicular artery              embolization. High Risk Factors: Hyperlipidemia, past history of smoking.  Performing Technologist: [place] RVT  Examination Guidelines: A complete evaluation includes at minimum, Doppler waveform signals and systolic blood pressure reading at the level of bilateral brachial, anterior tibial, and posterior tibial arteries, when vessel segments are accessible. Bilateral testing is considered an integral part of a complete examination. Photoelectric Plethysmograph (PPG) waveforms and toe systolic pressure readings are included as required and additional duplex testing as needed. Limited examinations for reoccurring indications may be performed as noted.  ABI Findings: +---------+------------------+-----+---------+--------+ Right    Rt Pressure (mmHg)IndexWaveform Comment  +---------+------------------+-----+---------+--------+ Brachial 94                                       +---------+------------------+-----+---------+--------+ ATA      102               1.09 triphasic         +---------+------------------+-----+---------+--------+ PTA      115               1.22 triphasic         +---------+------------------+-----+---------+--------+ Great Toe93                0.99                    +---------+------------------+-----+---------+--------+ +---------+------------------+-----+---------+-------+ Left     Lt Pressure (mmHg)IndexWaveform Comment +---------+------------------+-----+---------+-------+ Brachial 91                                      +---------+------------------+-----+---------+-------+ ATA      120               1.28 triphasic        +---------+------------------+-----+---------+-------+ PTA      130               1.38 triphasic        +---------+------------------+-----+---------+-------+ Great Toe96                1.02                  +---------+------------------+-----+---------+-------+ +-------+-----------+-----------+------------+------------+ ABI/TBIToday's ABIToday's TBIPrevious ABIPrevious TBI +-------+-----------+-----------+------------+------------+ Right  1.22  0.99                                +-------+-----------+-----------+------------+------------+ Left   1.38       1.02                                +-------+-----------+-----------+------------+------------+  No previous ABI.  Summary: Right: Resting right ankle-brachial index indicates noncompressible right lower extremity arteries. The right toe-brachial index is normal.  Left: Resting left ankle-brachial index indicates noncompressible left lower extremity arteries. The left toe-brachial index is normal.  *See table(s) above for measurements and observations.  Electronically signed by Norman Serve on 08/13/2024 at 2:48:41 PM.    Final    ECHOCARDIOGRAM COMPLETE Result Date: 07/29/2024    ECHOCARDIOGRAM REPORT   Patient Name:   Ambrea A Urton Date of Exam: 07/29/2024 Medical Rec #:  [id number]           Height:       70.0 in Accession #:    [id number]          Weight:       212.0 lb Date of Birth:  16-Nov-1950           BSA:          2.140 m Patient Age:    73 years            BP:           110/64 mmHg Patient Gender: F                   HR:            75 bpm. Exam Location:  Outpatient Procedure: 2D Echo, 3D Echo, Cardiac Doppler, Color Doppler and Strain Analysis            (Both Spectral and Color Flow Doppler were utilized during            procedure). Indications:    Syncope  History:        Patient has prior history of Echocardiogram examinations, most                 recent 02/25/2023. CAD, COPD; Risk Factors:Diabetes, Dyslipidemia                 and Former Smoker. PTSD.  Sonographer:    Orvil Holmes RDCS Referring Phys: [id number] JOANNA R WILLIAMSON IMPRESSIONS  1. Left ventricular ejection fraction, by estimation, is 60 to 65%. Left ventricular ejection fraction by 3D volume is 61 %. The left ventricle has normal function. The left ventricle has no regional wall motion abnormalities. Left ventricular diastolic  parameters are consistent with Grade I diastolic dysfunction (impaired relaxation).  2. Right ventricular systolic function is normal. The right ventricular size is normal. Tricuspid regurgitation signal is inadequate for assessing PA pressure.  3. The mitral valve is normal in structure. No evidence of mitral valve regurgitation. No evidence of mitral stenosis.  4. The left aortic cusp is heavily calcified and has limited mobility, but the other cusps move well. The aortic valve is tricuspid. There is moderate calcification of the aortic valve. There is moderate thickening of the aortic valve. Aortic valve regurgitation is not visualized. Aortic valve sclerosis/calcification is present, without any evidence of aortic stenosis.  5. The inferior vena cava is normal in size with greater than 50%  respiratory variability, suggesting right atrial pressure of 3 mmHg. FINDINGS  Left Ventricle: Left ventricular ejection fraction, by estimation, is 60 to 65%. Left ventricular ejection fraction by 3D volume is 61 %. The left ventricle has normal function. The left ventricle has no regional wall motion abnormalities. Global longitudinal strain performed  but not reported based on interpreter judgement due to suboptimal tracking. The left ventricular internal cavity size was normal in size. There is no left ventricular hypertrophy. Left ventricular diastolic parameters are consistent with Grade I diastolic dysfunction (impaired relaxation). Normal left ventricular filling pressure. Right Ventricle: The right ventricular size is normal. No increase in right ventricular wall thickness. Right ventricular systolic function is normal. Tricuspid regurgitation signal is inadequate for assessing PA pressure. Left Atrium: Left atrial size was normal in size. Right Atrium: Right atrial size was normal in size. Pericardium: There is no evidence of pericardial effusion. Mitral Valve: The mitral valve is normal in structure. No evidence of mitral valve regurgitation. No evidence of mitral valve stenosis. Tricuspid Valve: The tricuspid valve is grossly normal. Tricuspid valve regurgitation is not demonstrated. Aortic Valve: The left aortic cusp is heavily calcified and has limited mobility, but the other cusps move well. The aortic valve is tricuspid. There is moderate calcification of the aortic valve. There is moderate thickening of the aortic valve. Aortic valve regurgitation is not visualized. Aortic valve sclerosis/calcification is present, without any evidence of aortic stenosis. Pulmonic Valve: The pulmonic valve was grossly normal. Pulmonic valve regurgitation is not visualized. No evidence of pulmonic stenosis. Aorta: The aortic root and ascending aorta are structurally normal, with no evidence of dilitation. Venous: The inferior vena cava is normal in size with greater than 50% respiratory variability, suggesting right atrial pressure of 3 mmHg. IAS/Shunts: No atrial level shunt detected by color flow Doppler. Additional Comments: 3D was performed not requiring image post processing on an independent workstation and was normal.  LEFT VENTRICLE PLAX 2D LVIDd:         4.13  cm   Diastology LVIDs:         2.29 cm   LV e' medial:    6.31 cm/s LV PW:         1.10 cm   LV E/e' medial:  8.9 LV IVS:        1.10 cm   LV e' lateral:   10.80 cm/s LVOT diam:     1.90 cm   LV E/e' lateral: 5.2 LV SV:         56 LV SV Index:   26 LVOT Area:     2.84 cm                           3D Volume EF:                          3D EF:        61 %                          LV EDV:       131 ml                          LV ESV:       51 ml  LV SV:        80 ml RIGHT VENTRICLE RV Basal diam:  3.95 cm    PULMONARY VEINS RV Mid diam:    2.67 cm    A Reversal Velocity: 26.00 cm/s RV S prime:     9.46 cm/s  Diastolic Velocity:  27.80 cm/s TAPSE (M-mode): 2.1 cm     S/D Velocity:        1.30                            Systolic Velocity:   36.30 cm/s LEFT ATRIUM             Index        RIGHT ATRIUM           Index LA diam:        3.20 cm 1.50 cm/m   RA Area:     11.60 cm LA Vol (A2C):   49.0 ml 22.90 ml/m  RA Volume:   26.20 ml  12.24 ml/m LA Vol (A4C):   44.6 ml 20.84 ml/m LA Biplane Vol: 48.6 ml 22.71 ml/m  AORTIC VALVE LVOT Vmax:   107.00 cm/s LVOT Vmean:  71.100 cm/s LVOT VTI:    0.199 m  AORTA Ao Root diam: 3.20 cm Ao Asc diam:  3.60 cm MITRAL VALVE MV Area (PHT): 3.72 cm    SHUNTS MV Decel Time: 204 msec    Systemic VTI:  0.20 m MV E velocity: 56.20 cm/s  Systemic Diam: 1.90 cm MV A velocity: 82.70 cm/s MV E/A ratio:  0.68 Mihai Croitoru MD Electronically signed by Jerel Balding MD Signature Date/Time: 07/29/2024/3:27:27 PM    Final     EKG: Orders placed or performed in visit on 08/04/24   EKG 12-Lead     Hospital Course: DONNIKA KUCHER is a 73 y.o. who was admitted to Hospital. They were brought to the operating room on 08/26/2024 and underwent Procedure(s): ARTHROPLASTY, KNEE, TOTAL.  Patient tolerated the procedure well and was later transferred to the recovery room and then to the orthopaedic floor for postoperative care.  They were given PO and IV analgesics  for pain control following their surgery.  They were given 24 hours of postoperative antibiotics of  Anti-infectives (From admission, onward)    Start     Dose/Rate Route Frequency Ordered Stop   08/26/24 1815  ceFAZolin  (ANCEF ) IVPB 2g/100 mL premix        2 g 200 mL/hr over 30 Minutes Intravenous Every 6 hours 08/26/24 1728 08/27/24 0038   08/26/24 1431  vancomycin  (VANCOCIN ) powder  Status:  Discontinued          As needed 08/26/24 1431 08/26/24 1506   08/26/24 1015  ceFAZolin  (ANCEF ) IVPB 2g/100 mL premix        2 g 200 mL/hr over 30 Minutes Intravenous On call to O.R. 08/26/24 1000 08/26/24 1250   08/26/24 1005  ceFAZolin  (ANCEF ) 2-4 GM/100ML-% IVPB       Note to Pharmacy: Ezequiel Henri: cabinet override      08/26/24 1005 08/26/24 1230      and started on DVT prophylaxis in the form of Aspirin .   PT and OT were ordered for total joint protocol.  Discharge planning consulted to help with postop disposition and equipment needs if indicated.  Patient had an uneventful night on the evening of surgery.  They started to get up OOB with therapy on day one and  the patient had progressed with therapy and meeting their goals.  Patient was seen and cleared by appropriate care teams for discharge and was ready to go home.   Diet: Regular diet Activity:WBAT Follow-up:today Disposition - Home Discharged Condition: good   Discharge Instructions     Ambulatory referral to Home Health   Complete by: As directed    Please evaluate BURNIE HANK for admission to [facility].  Disciplines requested: Physical Therapy and Occupational Therapy  Services to provide: Strengthening Exercises and Evaluate  Physician to follow patient's care (the person listed here will be responsible for signing ongoing orders): Referring Provider  Requested Start of Care Date: Within 2-3 days  I certify that this patient is under my care and that I, or a Nurse Practitioner or Physician's Assistant  working with me, had a face-to-face encounter that meets the physician face-to-face requirements with patient on 08/27/2024. The encounter with the patient was in whole, or in part for the following medical condition(s) which is the primary reason for home health care (List medical condition). Right total knee arthroplasty  Special Instructions:  Please work on knee ROM, strengthening and gait training   Does the patient have Medicare or Medicaid?: Yes   The encounter with the patient was in whole, or in part, for the following medical condition, which is the primary reason for home health care: Right total knee arthroplasty for osteochondral fracture   Reason for Medically Necessary Home Health Services: Therapy- Therapeutic Exercises to Increase Strength and Endurance   My clinical findings support the need for the above services:  Pain interferes with ambulation/mobility Unable to leave home safely without assistance and/or assistive device     I certify that, based on my findings, the following services are medically necessary home health services: Physical therapy   Further, I certify that my clinical findings support that this patient is homebound due to: Pain interferes with ambulation/mobility   Call MD / Call 911   Complete by: As directed    If you experience chest pain or shortness of breath, CALL 911 and be transported to the hospital emergency room.  If you develope a fever above 101 F, pus (Azion Centrella drainage) or increased drainage or redness at the wound, or calf pain, call your surgeon's office.   Constipation Prevention   Complete by: As directed    Drink plenty of fluids.  Prune juice may be helpful.  You may use a stool softener, such as Colace (over the counter) 100 mg twice a day.  Use MiraLax  (over the counter) for constipation as needed.   Diet - low sodium heart healthy   Complete by: As directed    Increase activity slowly as tolerated   Complete by: As directed     Post-operative opioid taper instructions:   Complete by: As directed    POST-OPERATIVE OPIOID TAPER INSTRUCTIONS: It is important to wean off of your opioid medication as soon as possible. If you do not need pain medication after your surgery it is ok to stop day one. Opioids include: Codeine , Hydrocodone (Norco, Vicodin), Oxycodone (Percocet, oxycontin ) and hydromorphone  amongst others.  Long term and even short term use of opiods can cause: Increased pain response Dependence Constipation Depression Respiratory depression And more.  Withdrawal symptoms can include Flu like symptoms Nausea, vomiting And more Techniques to manage these symptoms Hydrate well Eat regular healthy meals Stay active Use relaxation techniques(deep breathing, meditating, yoga) Do Not substitute Alcohol to help with tapering If you have been  on opioids for less than two weeks and do not have pain than it is ok to stop all together.  Plan to wean off of opioids This plan should start within one week post op of your joint replacement. Maintain the same interval or time between taking each dose and first decrease the dose.  Cut the total daily intake of opioids by one tablet each day Next start to increase the time between doses. The last dose that should be eliminated is the evening dose.      Weight bearing as tolerated   Complete by: As directed    Laterality: right   Extremity: Lower      Allergies as of 08/27/2024       Reactions   Celexa [citalopram Hydrobromide]    goes weird   Gabapentin     goes weird  Other reaction(s): hall        Medication List     TAKE these medications    acetaminophen  500 MG tablet Commonly known as: TYLENOL  Take 2 tablets (1,000 mg total) by mouth every 8 (eight) hours.   albuterol  108 (90 Base) MCG/ACT inhaler Commonly known as: VENTOLIN  HFA Inhale 1-2 puffs into the lungs every 6 (six) hours as needed for wheezing or shortness of breath.    aspirin  81 MG chewable tablet Chew 1 tablet (81 mg total) by mouth 2 (two) times daily.   atorvastatin  80 MG tablet Commonly known as: LIPITOR  Take 1 tablet (80 mg total) by mouth daily. What changed: when to take this   buPROPion  150 MG 24 hr tablet Commonly known as: WELLBUTRIN  XL Take 150 mg by mouth in the morning.   cyclobenzaprine  10 MG tablet Commonly known as: FLEXERIL  Take 0.5-1 tablets (5-10 mg total) by mouth 3 (three) times daily as needed for muscle spasms.   docusate sodium  100 MG capsule Commonly known as: COLACE Take 1 capsule (100 mg total) by mouth 2 (two) times daily.   KERASAL FUNGAL NAIL RENEWAL EX Apply 1 Application topically in the morning and at bedtime.   metFORMIN  1000 MG tablet Commonly known as: GLUCOPHAGE  TAKE 1 TABLET BY MOUTH TWICE DAILY WITH A MEAL   oxyCODONE  5 MG immediate release tablet Commonly known as: Oxy IR/ROXICODONE  Take 1 tablet (5 mg total) by mouth every 4 (four) hours as needed for moderate pain (pain score 4-6).   QUEtiapine  400 MG 24 hr tablet Commonly known as: SEROQUEL  XR Take 800 mg by mouth at bedtime.   senna 8.6 MG Tabs tablet Commonly known as: SENOKOT Take 1 tablet (8.6 mg total) by mouth 2 (two) times daily.   tirzepatide  5 MG/0.5ML Pen Commonly known as: MOUNJARO  Inject 5 mg into the skin once a week. What changed: when to take this   Trelegy Ellipta  100-62.5-25 MCG/ACT Aepb Generic drug: Fluticasone -Umeclidin-Vilant INHALE 1 PUFF ONCE DAILY               Discharge Care Instructions  (From admission, onward)           Start     Ordered   08/27/24 0000  Weight bearing as tolerated       Question Answer Comment  Laterality right   Extremity Lower      08/27/24 1019             Signed: Rankin Pizza, MD Orthopaedic Surgery 08/27/2024, 10:25 AM

## 2024-08-28 NOTE — Progress Notes (Signed)
 PROGRESS NOTE    Judy [name]  FMW:[id number] DOB: [birth year] DOA: 08/26/2024 PCP: Ozell Heron HERO, MD   Brief Narrative:  Judy [name] is a 73 y.o. female with medical history significant of hyperlipidemia, diabetes, CAD, anxiety, PTSD, bipolar disorder, RLS, COPD, ankylosing spondylitis presenting for scheduled right total knee arthroplasty in the setting of osteonecrosis and osteochondral fracture.   Patient has known history of osteoporosis and osteochondral fracture for which she follows with orthopedic surgery.  Presents today for scheduled right total knee arthroplasty.  Underwent uncomplicated surgical intervention.   Hospitalist service asked to consult to assist with management of comorbid conditions.   Patient reports feeling okay with some pain after surgery. Patient not aware of her diabetes, though she was receiving diabetes medicines for weight loss only.   Patient denies fevers, chills, chest pain, shortness of breath, abdominal pain, constipation, diarrhea, nausea, vomiting.  Assessment and Plan:  Status post total knee replacement > Presented for scheduled right total knee arthroplasty in the setting of osteonecrosis and osteochondral fracture. > Underwent successful surgical intervention. - Management per primary team - Is receiving pain medication   Diabetes Mellitus Type 2 > Patient was unaware of diabetes diagnosis.  Thought she did not have it and that she was taking medicine for weight loss only.  Told her I would look into it. > Chart review indicates patient did have elevated A1c to 6.61-year ago and has been slowly improving with 5.8 earlier this year and improved to 5.3 yesterday.  Will need to be communicated with her. - SSI while admitted   Hyperlipidemia - Continue home atorvastatin    CAD - Continue home atorvastatin    Anxiety PTSD Bipolar disorder - Continue home bupropion  and quetiapine    COPD - Replacement Trelegy with  formulary Breztri  - Continue albuterol  as needed  Leukocytosis: Mild. WBC went from 5.5 -> 11.0. CTM    Normocytic Anemia: Hgb/Hct Trend:  Recent Labs  Lab 08/25/24 1202 08/27/24 0425  HGB 11.6* 9.4*  HCT 36.9 29.9*  MCV 89.6 90.1  -Check Anemia Panel in the AM. CTM for S/Sx of Bleeding; No overt bleeding noted -Repeat CBC within 1 week   Hypoalbuminemia: Patient's Albumin Lvl is now 2.7. CTM & Trend & Repeat CMP in the AM  Overweight: Complicates overall prognosis and care. Estimated body mass index is 28.84 kg/m as calculated from the following:   Height as of this encounter: 5' 10 (1.778 m).   Weight as of this encounter: 91.2 kg. Weight Loss and Dietary Counseling given   DVT prophylaxis: Per primary    Code Status: Prior Family Communication: No family present @ bedside  Disposition Plan:  Level of care: Med-Surg  Consultants:  Orthopedic Surgery [facility]  Procedures:  As delineated as above   Antimicrobials:  Anti-infectives (From admission, onward)    Start     Dose/Rate Route Frequency Ordered Stop   08/26/24 1815  ceFAZolin  (ANCEF ) IVPB 2g/100 mL premix        2 g 200 mL/hr over 30 Minutes Intravenous Every 6 hours 08/26/24 1728 08/27/24 0038   08/26/24 1431  vancomycin  (VANCOCIN ) powder  Status:  Discontinued          As needed 08/26/24 1431 08/26/24 1506   08/26/24 1015  ceFAZolin  (ANCEF ) IVPB 2g/100 mL premix        2 g 200 mL/hr over 30 Minutes Intravenous On call to O.R. 08/26/24 1000 08/26/24 1250   08/26/24 1005  ceFAZolin  (ANCEF ) 2-4 GM/100ML-% IVPB  Note to Pharmacy: Judy [name]: cabinet override      08/26/24 1005 08/26/24 1230       Subjective: Seen and examined at bedside she is going home today.  Total care and states pain is fairly well-controlled.  No other concerns or complaint at this time.  Objective: Vitals:   08/26/24 2029 08/27/24 0343 08/27/24 0810 08/27/24 0814  BP:  118/72  110/68  Pulse:  64 65 71  Resp:   18  16  Temp:  98.2 F (36.8 C)  97.6 F (36.4 C)  TempSrc:    Oral  SpO2: 96% 98%  94%  Weight:      Height:       No intake or output data in the 24 hours ending 08/28/24 2153 Filed Weights   08/26/24 1005  Weight: 91.2 kg   Examination: Physical Exam:  Constitutional: WN/WD overweight Caucasian female no acute distress Respiratory: Diminished to auscultation bilaterally, no wheezing, rales, rhonchi or crackles. Normal respiratory effort and patient is not tachypenic. No accessory muscle use.  Unlabored breathing Cardiovascular: RRR, no murmurs / rubs / gallops. S1 and S2 auscultated. No extremity edema.  Abdomen: Soft, non-tender, distended secondary by habitus. Bowel sounds positive.  GU: Deferred. Musculoskeletal: Right leg is wrapped Skin: No rashes, lesions, ulcers on limited skin evaluation. No induration; Warm and dry.  Neurologic: CN 2-12 grossly intact with no focal deficits. Romberg sign and cerebellar reflexes not assessed.  Psychiatric: Normal judgment and insight. Alert and oriented x 3. Normal mood and appropriate affect.   Data Reviewed: I have personally reviewed following labs and imaging studies  CBC: Recent Labs  Lab 08/25/24 1202 08/27/24 0425  WBC 5.5 11.0*  HGB 11.6* 9.4*  HCT 36.9 29.9*  MCV 89.6 90.1  PLT 266 229   Basic Metabolic Panel: Recent Labs  Lab 08/25/24 1202 08/27/24 0425  NA 139 137  K 3.9 4.1  CL 104 104  CO2 23 21*  GLUCOSE 90 173*  BUN 13 12  CREATININE 1.12* 0.96  CALCIUM  9.9 9.1  MG  --  1.7  PHOS  --  3.3   GFR: Estimated Creatinine Clearance: 64.9 mL/min (by C-G formula based on SCr of 0.96 mg/dL). Liver Function Tests: Recent Labs  Lab 08/27/24 0425  AST 19  ALT 15  ALKPHOS 114  BILITOT 0.3  PROT 5.5*  ALBUMIN 2.7*   No results for input(s): LIPASE, AMYLASE in the last 168 hours. No results for input(s): AMMONIA in the last 168 hours. Coagulation Profile: No results for input(s): INR, PROTIME in  the last 168 hours. Cardiac Enzymes: No results for input(s): CKTOTAL, CKMB, CKMBINDEX, TROPONINI in the last 168 hours. BNP (last 3 results) No results for input(s): PROBNP in the last 8760 hours. HbA1C: No results for input(s): HGBA1C in the last 72 hours. CBG: Recent Labs  Lab 08/26/24 1009  GLUCAP 125*   Lipid Profile: No results for input(s): CHOL, HDL, LDLCALC, TRIG, CHOLHDL, LDLDIRECT in the last 72 hours. Thyroid  Function Tests: No results for input(s): TSH, T4TOTAL, FREET4, T3FREE, THYROIDAB in the last 72 hours. Anemia Panel: No results for input(s): VITAMINB12, FOLATE, FERRITIN, TIBC, IRON, RETICCTPCT in the last 72 hours. Sepsis Labs: No results for input(s): PROCALCITON, LATICACIDVEN in the last 168 hours.  Recent Results (from the past 240 hours)  Surgical pcr screen     Status: Abnormal   Collection Time: 08/25/24  3:35 PM   Specimen: Nasal Mucosa; Nasal Swab  Result Value Ref Range Status  MRSA, PCR NEGATIVE NEGATIVE Final   Staphylococcus aureus POSITIVE (A) NEGATIVE Final    Comment: (NOTE) The Xpert SA Assay (FDA approved for NASAL specimens in patients 18 years of age and older), is one component of a comprehensive surveillance program. It is not intended to diagnose infection nor to guide or monitor treatment. Performed at [facility], [address] [place], [place] [address]     Radiology Studies: No results found.  Scheduled Meds: Continuous Infusions:   LOS: 0 days   Alejandro Marker, DO [facility] Available via Epic secure chat 7am-7pm After these hours, please refer to coverage provider listed on [email or web address] 08/28/2024, 9:53 PM

## 2024-08-30 ENCOUNTER — Inpatient Hospital Stay: Admitting: Family Medicine

## 2024-08-30 LAB — SURGICAL PATHOLOGY

## 2024-08-30 MED ORDER — TIRZEPATIDE 5 MG/0.5ML ~~LOC~~ SOAJ: 5.0000 mg | SUBCUTANEOUS | 1 refills | Status: DC

## 2024-09-07 ENCOUNTER — Other Ambulatory Visit: Payer: Self-pay | Admitting: Family Medicine

## 2024-09-07 ENCOUNTER — Other Ambulatory Visit: Payer: Self-pay | Admitting: Pulmonary Disease

## 2024-09-07 DIAGNOSIS — R252 Cramp and spasm: Secondary | ICD-10-CM

## 2024-09-07 DIAGNOSIS — E119 Type 2 diabetes mellitus without complications: Secondary | ICD-10-CM

## 2024-09-07 MED ORDER — TRELEGY ELLIPTA 100-62.5-25 MCG/ACT IN AEPB: 1.0000 | INHALATION_SPRAY | Freq: Every day | RESPIRATORY_TRACT | 0 refills | Status: AC

## 2024-09-07 NOTE — Telephone Encounter (Signed)
 Copied from CRM #[id number]. Topic: Clinical - Medication Refill >> Sep 07, 2024  9:38 AM Ismael A wrote: Medication:  albuterol  (VENTOLIN  HFA) 108 (90 Base) MCG/ACT inhaler Fluticasone -Umeclidin-Vilant (TRELEGY ELLIPTA ) 100-62.5-25 MCG/ACT AEPB  Has the patient contacted their pharmacy? Yes (Agent: If no, request that the patient contact the pharmacy for the refill. If patient does not wish to contact the pharmacy document the reason why and proceed with request.) (Agent: If yes, when and what did the pharmacy advise?)  This is the patient's preferred pharmacy:   [facility] - [place], [place] - [address] [phone]  Is this the correct pharmacy for this prescription? Yes If no, delete pharmacy and type the correct one.   Has the prescription been filled recently? No  Is the patient out of the medication? No  Has the patient been seen for an appointment in the last year OR does the patient have an upcoming appointment? No  Can we respond through MyChart? No  Agent: Please be advised that Rx refills may take up to 3 business days. We ask that you follow-up with your pharmacy.

## 2024-09-13 ENCOUNTER — Telehealth: Payer: Self-pay | Admitting: *Deleted

## 2024-09-13 DIAGNOSIS — J449 Chronic obstructive pulmonary disease, unspecified: Secondary | ICD-10-CM

## 2024-09-13 MED ORDER — ALBUTEROL SULFATE HFA 108 (90 BASE) MCG/ACT IN AERS: 1.0000 | INHALATION_SPRAY | Freq: Four times a day (QID) | RESPIRATORY_TRACT | 0 refills | Status: AC | PRN

## 2024-09-13 NOTE — Telephone Encounter (Signed)
 Albuterol  sent Trelegy sent previously.  Copied from CRM #[id number]. Topic: Clinical - Medication Refill >> Sep 07, 2024  9:38 AM Ismael A wrote: Medication:  albuterol  (VENTOLIN  HFA) 108 (90 Base) MCG/ACT inhaler Fluticasone -Umeclidin-Vilant (TRELEGY ELLIPTA ) 100-62.5-25 MCG/ACT AEPB  Has the patient contacted their pharmacy? Yes (Agent: If no, request that the patient contact the pharmacy for the refill. If patient does not wish to contact the pharmacy document the reason why and proceed with request.) (Agent: If yes, when and what did the pharmacy advise?)  This is the patient's preferred pharmacy:   [facility] - [place], [place] - [address] [phone]  Is this the correct pharmacy for this prescription? Yes If no, delete pharmacy and type the correct one.   Has the prescription been filled recently? No  Is the patient out of the medication? No  Has the patient been seen for an appointment in the last year OR does the patient have an upcoming appointment? No  Can we respond through MyChart? No  Agent: Please be advised that Rx refills may take up to 3 business days. We ask that you follow-up with your pharmacy. >> Sep 13, 2024 12:31 PM Leila BROCKS wrote: Jaden from [facility] [phone] checking on medication refill status. Jaden called last week and received Trelegy inhaler, but not albuterol  (VENTOLIN  HFA) 108 (90 Base) MCG/ACT inhaler. Please advise.   [facility] - [address], [place] - [address] [place] [address] Phone: [phone] Fax: [phone]

## 2024-09-17 ENCOUNTER — Other Ambulatory Visit: Payer: Self-pay | Admitting: Family Medicine

## 2024-09-17 DIAGNOSIS — R252 Cramp and spasm: Secondary | ICD-10-CM

## 2024-09-17 NOTE — Telephone Encounter (Unsigned)
 Copied from CRM #[id number]. Topic: Clinical - Medication Refill >> Sep 17, 2024 10:21 AM Brittany M wrote: Medication: tiZANidine  (ZANAFLEX ) 4 MG tablet ;  cyclobenzaprine  (FLEXERIL ) 10 MG tablet ;  Dermatological Products, Misc. [facility] FUNGAL NAIL RENEWAL EX)    Has the patient contacted their pharmacy? Yes (Agent: If no, request that the patient contact the pharmacy for the refill. If patient does not wish to contact the pharmacy document the reason why and proceed with request.) (Agent: If yes, when and what did the pharmacy advise?)  This is the patient's preferred pharmacy:  [facility], [place] - [address] [place] [address] Phone: [phone] Fax: [phone]  Is this the correct pharmacy for this prescription? Yes If no, delete pharmacy and type the correct one.   Has the prescription been filled recently? Yes  Is the patient out of the medication? Yes  Has the patient been seen for an appointment in the last year OR does the patient have an upcoming appointment? Yes  Can we respond through MyChart? Yes  Agent: Please be advised that Rx refills may take up to 3 business days. We ask that you follow-up with your pharmacy.

## 2024-09-17 NOTE — Telephone Encounter (Signed)
 Tizanidine  unable to be pended, not on current med list, last filled 04/23/24

## 2024-09-20 ENCOUNTER — Telehealth: Payer: Self-pay | Admitting: Family Medicine

## 2024-09-20 NOTE — Telephone Encounter (Unsigned)
 Copied from CRM #[id number]. Topic: Clinical - Medication Refill >> Sep 20, 2024  9:55 AM Harlene ORN wrote: Medication: cyclobenzaprine  (FLEXERIL ) 10 MG tablet, Dermatological Products, Misc. ([facility] FUNGAL NAIL RENEWAL EX), tiZANidine  (ZANAFLEX ) 4 MG tablet  Has the patient contacted their pharmacy? Yes (Agent: If no, request that the patient contact the pharmacy for the refill. If patient does not wish to contact the pharmacy document the reason why and proceed with request.) (Agent: If yes, when and what did the pharmacy advise?)  This is the patient's preferred pharmacy:  [facility], [place] - [address] [place] [address] Phone: [phone] Fax: [phone]  Is this the correct pharmacy for this prescription? Yes If no, delete pharmacy and type the correct one.   Has the prescription been filled recently? No  Is the patient out of the medication? No  Has the patient been seen for an appointment in the last year OR does the patient have an upcoming appointment? Yes  Can we respond through MyChart? Yes  Agent: Please be advised that Rx refills may take up to 3 business days. We ask that you follow-up with your pharmacy.

## 2024-09-21 MED ORDER — KERASAL FUNGAL NAIL RENEWAL EX SOLN: 1.0000 | Freq: Two times a day (BID) | CUTANEOUS | 2 refills | Status: AC

## 2024-09-21 MED ORDER — CYCLOBENZAPRINE HCL 10 MG PO TABS: 5.0000 mg | ORAL_TABLET | Freq: Three times a day (TID) | ORAL | 2 refills | Status: AC | PRN

## 2024-09-24 NOTE — Therapy (Signed)
 " OUTPATIENT PHYSICAL THERAPY LOWER EXTREMITY EVALUATION   Patient Name: Judy [name] MRN: [id number] DOB:[birth year], 73 y.o., female Today's Date: 09/28/2024  END OF SESSION:  PT End of Session - 09/28/24 0731     Visit Number 1    Number of Visits 21    Date for Recertification  12/07/24    Authorization Type Medicare/[employer]    PT Start Time 1015    PT Stop Time 1050    PT Time Calculation (min) 35 min    Activity Tolerance Patient tolerated treatment well    Behavior During Therapy [facility] for tasks assessed/performed          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   Bipolar 1 disorder (HCC)    Bronchitis    hx   COPD (chronic obstructive pulmonary disease) (HCC)    Coronary artery disease 08/04/2024   Depression    Dyspnea    with exertion   Osteoarthritis 03/10/2024   Pneumonia    Pre-diabetes    PTSD (post-traumatic stress disorder)    Sleep apnea 12/2023   mild, no CPAP   Uses walker    Past Surgical History:  Procedure Laterality Date   bone spurs foot Right    CATARACT EXTRACTION Bilateral    COLONOSCOPY  07/2022   x several   HAND SURGERY Left    TONSILLECTOMY AND ADENOIDECTOMY     TOTAL HIP ARTHROPLASTY Left 03/19/2019   Procedure: LEFT TOTAL HIP ARTHROPLASTY ANTERIOR APPROACH;  Surgeon: Vernetta Lonni GRADE, MD;  Location: [facility];  Service: Orthopedics;  Laterality: Left;   TOTAL HIP ARTHROPLASTY Right 06/04/2019   Procedure: RIGHT TOTAL HIP ARTHROPLASTY ANTERIOR APPROACH;  Surgeon: Vernetta Lonni GRADE, MD;  Location: [facility];  Service: Orthopedics;  Laterality: Right;   TOTAL KNEE ARTHROPLASTY Left 12/28/2021   Procedure: Left TOTAL KNEE ARTHROPLASTY;  Surgeon: Vernetta Lonni GRADE, MD;  Location: [facility];   Service: Orthopedics;  Laterality: Left;   TOTAL KNEE ARTHROPLASTY Right 08/26/2024   Procedure: ARTHROPLASTY, KNEE, TOTAL;  Surgeon: Teresa Rankin ORN, MD;  Location: [facility];  Service: Orthopedics;  Laterality: Right;   Patient Active Problem List   Diagnosis Date Noted   Status post total knee replacement 08/26/2024   Claudication 08/04/2024   Coronary artery disease involving native coronary artery of native heart without angina pectoris 03/08/2024   Aortic atherosclerosis 03/08/2024   Unilateral primary osteoarthritis, right knee 12/31/2023   Hyperlipidemia 07/04/2023   Diabetes mellitus without complication (HCC) 08/09/2022   Restless legs syndrome 08/09/2022   Status post total left knee replacement 12/28/2021   Primary osteoarthritis of left knee 10/25/2021   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) 11/30/2020   Status post total replacement of right hip 06/04/2019   Status post total replacement of left hip 03/19/2019   PTSD (post-traumatic stress disorder)    COPD (chronic obstructive pulmonary disease) (HCC)    Bipolar 1 disorder (HCC)    Anxiety    B12 deficiency 12/09/2018   Hyperglycemia 04/14/2017    PCP: Ozell Heron HERO, MD  REFERRING PROVIDER: Rankin Teresa MD   REFERRING DIAG:  Diagnosis  [phone] (ICD-10-CM) - Other specified postprocedural states   THERAPY DIAG:  Acute pain of  right knee  Muscle weakness (generalized)  Difficulty in walking, not elsewhere classified  Localized edema  Stiffness of right knee, not elsewhere classified  Rationale for Evaluation and Treatment: Rehabilitation  ONSET DATE: Surgery-R TKA 08/26/24   SUBJECTIVE:   SUBJECTIVE STATEMENT: Pt was delaying R knee surgery and finally got it done in November 2025. Had 2x week home PT for the last month.  Reports this surgery went  better than the other knee surgery.  PERTINENT HISTORY: L TKA 3 years ago PAIN:  Are you having pain? Yes: NPRS scale:  3/10 Pain location: R knee Pain description: anterior Aggravating factors: prolonged sitting, standing, stairs, ambulation Relieving factors: rest, ice  PRECAUTIONS: None  RED FLAGS: None   WEIGHT BEARING RESTRICTIONS: No  FALLS:  Has patient fallen in last 6 months? Not asked  LIVING ENVIRONMENT: Lives with: lives alone Lives in: House/apartment Stairs: Yes: External: 4 steps; on right going up Has following equipment at home: Single point cane  OCCUPATION: retired  PLOF: Independent  PATIENT GOALS: increase activity  NEXT MD VISIT: unknown  OBJECTIVE:  Note: Objective measures were completed at Evaluation unless otherwise noted.  DIAGNOSTIC FINDINGS: n  PATIENT SURVEYS:  PSFS: THE PATIENT SPECIFIC FUNCTIONAL SCALE  Place score of 0-10 (0 = unable to perform activity and 10 = able to perform activity at the same level as before injury or problem)  Activity Date: 09/27/24    Out of car 8    2.walking in grocery store 3    3.stairs without handrail 1    4.      Total Score 4      Total Score = Sum of activity scores/number of activities  Minimally Detectable Change: 3 points (for single activity); 2 points (for average score)  [facility] (nd). The Patient Specific Functional Scale . Retrieved from [email or web address].com.pt   COGNITION: Overall cognitive status: Within functional limits for tasks assessed     SENSATION: WFL  EDEMA:  Circumferential: 41 cm  MUSCLE LENGTH: Hamstrings: Right mild restriction  POSTURE: No Significant postural limitations  PALPATION: 1+ tenderness jt line  LOWER EXTREMITY ROM:  Active/Passive ROM Right eval Left eval  Hip flexion    Hip extension    Hip abduction    Hip adduction    Hip internal rotation    Hip external rotation    Knee flexion Supine 110A/115P   Knee extension Supine +9<0 A   Ankle dorsiflexion    Ankle plantarflexion    Ankle  inversion    Ankle eversion     (Blank rows = not tested)  LOWER EXTREMITY MMT:  MMT Right eval Left eval  Hip flexion 4   Hip extension    Hip abduction 4   Hip adduction    Hip internal rotation    Hip external rotation    Knee flexion 4+   Knee extension 4   Ankle dorsiflexion    Ankle plantarflexion    Ankle inversion    Ankle eversion     (Blank rows = not tested)  LOWER EXTREMITY SPECIAL TESTS:  No special tests - post surgical  FUNCTIONAL TESTS:  5 times sit to stand: 16  GAIT: Distance walked: household  Assistive device utilized: Single point cane and Walker - 2 wheeled Level of assistance: Complete Independence Comments: ---  TREATMENT DATE: 09/27/24 Initial evaluation of right knee completed followed by instruction and trial set of HEP.   PATIENT EDUCATION:  Education details: HEP Person educated: Patient Education method: Programmer, Multimedia, Facilities Manager, Actor cues, Verbal cues, and Handouts Education comprehension: verbalized understanding, returned demonstration, and verbal cues required  HOME EXERCISE PROGRAM: Access Code: [email or web address] URL: [email or web address] Date: 09/27/2024 Prepared by: Burnard Meth  Exercises - Supine Heel Slide  - 2 x daily - 7 x weekly - 2 sets - 10 reps - Supine Quad Set  - 2 x daily - 7 x weekly - 2 sets - 10 reps - 5 hold - Long Sitting Quad Set with Towel Roll Under Heel  - 2 x daily - 7 x weekly - 2 sets - 10 reps - 5 hold - Supine Active Straight Leg Raise  - 2 x daily - 7 x weekly - 32 sets - 10 reps - Supine Bridge  - 2 x daily - 7 x weekly - 2 sets - 10 reps - Seated Long Arc Quad  - 2 x daily - 7 x weekly - 2 sets - 10 reps - Standing Heel Raise with Support  - 2 x daily - 7 x weekly - 2 sets - 10 reps - Standing Knee Flexion with Counter Support  - 2 x daily - 7 x weekly - 2  sets - 10 reps - Mini Squat with Counter Support  - 2 x daily - 7 x weekly - 2 sets - 10 reps  ASSESSMENT:  CLINICAL IMPRESSION: Patient is a 73y.o. female who was seen today for physical therapy evaluation and treatment for status post right TKA.  She presents with decreased range of motion, decreased strength, pain, decreased flexibility and altered gait.  Patient would benefit from skilled physical therapy services to address the above deficits and return to previous level of activity.  OBJECTIVE IMPAIRMENTS: decreased balance, decreased mobility, difficulty walking, decreased ROM, decreased strength, increased edema, impaired flexibility, and pain.   ACTIVITY LIMITATIONS: standing, squatting, sleeping, stairs, and transfers  PARTICIPATION LIMITATIONS: meal prep, driving, and community activity  PERSONAL FACTORS: None are also affecting patient's functional outcome.   REHAB POTENTIAL: Good  CLINICAL DECISION MAKING: Stable/uncomplicated  EVALUATION COMPLEXITY: Moderate   GOALS: Goals reviewed with patient? Yes  SHORT TERM GOALS: Target date: 10/19/2024   Patient to be independent with HEP. Baseline: Goal status: INITIAL  2.  Decrease pain by 1 level. Baseline:  Goal status: INITIAL  LONG TERM GOALS: Target date: 12/07/2024  10 weeks    Patient to be independent with self progressive HEP at discharge. Baseline:  Goal status: INITIAL  2.  Decrease max pain to 2 out of 10 or less with all activities. Baseline:  Goal status: INITIAL  3.  Patient able to ambulate short community distances without assistive device and good ambulation pattern. Baseline:  Goal status: INITIAL  4.  Increase active range of motion to 0>115 or greater at discharge. Baseline:  Goal status: INITIAL  5.  Increase strength by half a grade to 1 full grade in affected lower extremity to assist with all ADLs. Baseline:  Goal status: INITIAL  6.  Increase score of PSFS by 3 points or more to  indicate measurable difference. Baseline:  Goal status: INITIAL   PLAN:  PT FREQUENCY: 2x/week  PT DURATION: 10 weeks  PLANNED INTERVENTIONS: 97164- PT Re-evaluation, 97110-Therapeutic exercises, 97530- Therapeutic activity, [id number]- Neuromuscular re-education, 97535- Self Care, 02859- Manual therapy, [id number]- Gait training, [phone]- Aquatic Therapy, [id number]-  Electrical stimulation (unattended), 97016- Vasopneumatic device, [id number] (1-2 muscles), 20561 (3+ muscles)- Dry Needling, Patient/Family education, Balance training, Stair training, Joint mobilization, Scar mobilization, DME instructions, Cryotherapy, and Moist heat  PLAN FOR NEXT SESSION: Review HEP.  Start on bike.   Burnard CHRISTELLA Meth, PT 09/28/2024, 7:33 AM  "

## 2024-09-27 ENCOUNTER — Other Ambulatory Visit: Payer: Self-pay

## 2024-09-27 ENCOUNTER — Ambulatory Visit

## 2024-09-27 DIAGNOSIS — R262 Difficulty in walking, not elsewhere classified: Secondary | ICD-10-CM | POA: Diagnosis not present

## 2024-09-27 DIAGNOSIS — M25561 Pain in right knee: Secondary | ICD-10-CM

## 2024-09-27 DIAGNOSIS — M6281 Muscle weakness (generalized): Secondary | ICD-10-CM | POA: Diagnosis not present

## 2024-09-27 DIAGNOSIS — M25661 Stiffness of right knee, not elsewhere classified: Secondary | ICD-10-CM

## 2024-09-27 DIAGNOSIS — R600 Localized edema: Secondary | ICD-10-CM | POA: Diagnosis not present

## 2024-09-28 ENCOUNTER — Encounter

## 2024-10-04 NOTE — Therapy (Signed)
 " OUTPATIENT PHYSICAL THERAPY LOWER EXTREMITY TREATMENT   Patient Name: Judy [name] MRN: [id number] DOB:May 19, 1951, 73 y.o., female Today's Date: 10/06/2024  END OF SESSION:  PT End of Session - 10/06/24 1305     Visit Number 2    Number of Visits 21    Date for Recertification  12/07/24    Authorization Type Medicare/[employer]    PT Start Time 1304    PT Stop Time 1352    PT Time Calculation (min) 48 min    Activity Tolerance Patient tolerated treatment well    Behavior During Therapy [facility] for tasks assessed/performed           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   Bipolar 1 disorder (HCC)    Bronchitis    hx   COPD (chronic obstructive pulmonary disease) (HCC)    Coronary artery disease 08/04/2024   Depression    Dyspnea    with exertion   Osteoarthritis 03/10/2024   Pneumonia    Pre-diabetes    PTSD (post-traumatic stress disorder)    Sleep apnea 12/2023   mild, no CPAP   Uses walker    Past Surgical History:  Procedure Laterality Date   bone spurs foot Right    CATARACT EXTRACTION Bilateral    COLONOSCOPY  07/2022   x several   HAND SURGERY Left    TONSILLECTOMY AND ADENOIDECTOMY     TOTAL HIP ARTHROPLASTY Left 03/19/2019   Procedure: LEFT TOTAL HIP ARTHROPLASTY ANTERIOR APPROACH;  Surgeon: Vernetta Lonni GRADE, MD;  Location: [facility];  Service: Orthopedics;  Laterality: Left;   TOTAL HIP ARTHROPLASTY Right 06/04/2019   Procedure: RIGHT TOTAL HIP ARTHROPLASTY ANTERIOR APPROACH;  Surgeon: Vernetta Lonni GRADE, MD;  Location: [facility];  Service: Orthopedics;  Laterality: Right;   TOTAL KNEE ARTHROPLASTY Left 12/28/2021   Procedure: Left TOTAL KNEE ARTHROPLASTY;  Surgeon: Vernetta Lonni GRADE, MD;  Location: [facility];   Service: Orthopedics;  Laterality: Left;   TOTAL KNEE ARTHROPLASTY Right 08/26/2024   Procedure: ARTHROPLASTY, KNEE, TOTAL;  Surgeon: Teresa Rankin ORN, MD;  Location: [facility];  Service: Orthopedics;  Laterality: Right;   Patient Active Problem List   Diagnosis Date Noted   Status post total knee replacement 08/26/2024   Claudication 08/04/2024   Coronary artery disease involving native coronary artery of native heart without angina pectoris 03/08/2024   Aortic atherosclerosis 03/08/2024   Unilateral primary osteoarthritis, right knee 12/31/2023   Hyperlipidemia 07/04/2023   Diabetes mellitus without complication (HCC) 08/09/2022   Restless legs syndrome 08/09/2022   Status post total left knee replacement 12/28/2021   Primary osteoarthritis of left knee 10/25/2021   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) 11/30/2020   Status post total replacement of right hip 06/04/2019   Status post total replacement of left hip 03/19/2019   PTSD (post-traumatic stress disorder)    COPD (chronic obstructive pulmonary disease) (HCC)    Bipolar 1 disorder (HCC)    Anxiety    B12 deficiency 12/09/2018   Hyperglycemia 04/14/2017    PCP: Ozell Heron HERO, MD  REFERRING PROVIDER: Rankin Teresa MD   REFERRING DIAG:  Diagnosis  [phone] (ICD-10-CM) - Other specified postprocedural states   THERAPY DIAG:  Acute pain  of right knee  Muscle weakness (generalized)  Difficulty in walking, not elsewhere classified  Localized edema  Stiffness of right knee, not elsewhere classified  Rationale for Evaluation and Treatment: Rehabilitation  ONSET DATE: Surgery-R TKA 08/26/24   SUBJECTIVE:   SUBJECTIVE STATEMENT: Pt reports knee feels  cranky .    PERTINENT HISTORY: L TKA 3 years ago PAIN:  Are you having pain? Yes: NPRS scale: 2/10 Pain location: R knee Pain description: anterior Aggravating factors: prolonged sitting, standing, stairs, ambulation Relieving factors: rest,  ice  PRECAUTIONS: None  RED FLAGS: None   WEIGHT BEARING RESTRICTIONS: No  FALLS:  Has patient fallen in last 6 months? Not asked  LIVING ENVIRONMENT: Lives with: lives alone Lives in: House/apartment Stairs: Yes: External: 4 steps; on right going up Has following equipment at home: Single point cane  OCCUPATION: retired  PLOF: Independent  PATIENT GOALS: increase activity  NEXT MD VISIT: unknown  OBJECTIVE:  Note: Objective measures were completed at Evaluation unless otherwise noted.  DIAGNOSTIC FINDINGS: n  PATIENT SURVEYS:  PSFS: THE PATIENT SPECIFIC FUNCTIONAL SCALE  Place score of 0-10 (0 = unable to perform activity and 10 = able to perform activity at the same level as before injury or problem)  Activity Date: 09/27/24    Out of car 8    2.walking in grocery store 3    3.stairs without handrail 1    4.      Total Score 4      Total Score = Sum of activity scores/number of activities  Minimally Detectable Change: 3 points (for single activity); 2 points (for average score)  [employer] (nd). The Patient Specific Functional Scale . Retrieved from [email or web address].[email or web address]   COGNITION: Overall cognitive status: Within functional limits for tasks assessed     SENSATION: WFL  EDEMA:  Circumferential: 41 cm  MUSCLE LENGTH: Hamstrings: Right mild restriction  POSTURE: No Significant postural limitations  PALPATION: 1+ tenderness jt line  LOWER EXTREMITY ROM:  Active/Passive ROM Right eval Left eval  Hip flexion    Hip extension    Hip abduction    Hip adduction    Hip internal rotation    Hip external rotation    Knee flexion Supine 110A/115P   Knee extension Supine +9<0 A   Ankle dorsiflexion    Ankle plantarflexion    Ankle inversion    Ankle eversion     (Blank rows = not tested)  LOWER EXTREMITY MMT:  MMT Right eval Left eval  Hip flexion 4   Hip extension     Hip abduction 4   Hip adduction    Hip internal rotation    Hip external rotation    Knee flexion 4+   Knee extension 4   Ankle dorsiflexion    Ankle plantarflexion    Ankle inversion    Ankle eversion     (Blank rows = not tested)  LOWER EXTREMITY SPECIAL TESTS:  No special tests - post surgical  FUNCTIONAL TESTS:  5 times sit to stand: 16  GAIT: Distance walked: household  Assistive device utilized: Single point cane and Walker - 2 wheeled Level of assistance: Complete Independence Comments: ---  TREATMENT DATE: R TKA 10/06/24 Sci fit at seat 11 full circles 5 min In // bars with 6 step for function and balance 15x stepping up, 10x stepping laterally  Lean ins for inc flexion 10x on 6 step Leg Press 3x10 bilaterally #75 Sit to stand with 2# ball and 20 seat height 10x Seated SLR 2# 3x10 Supine SAQ 3x10  #2 Vaso: at 36 degrees and med compression for 10 min   09/27/24 Initial evaluation of right knee completed followed by instruction and trial set of HEP.   PATIENT EDUCATION:  Education details: HEP Person educated: Patient Education method: Programmer, Multimedia, Facilities Manager, Actor cues, Verbal cues, and Handouts Education comprehension: verbalized understanding, returned demonstration, and verbal cues required  HOME EXERCISE PROGRAM: Access Code: [email or web address] URL: [email or web address] Date: 09/27/2024 Prepared by: Burnard Meth  Exercises - Supine Heel Slide  - 2 x daily - 7 x weekly - 2 sets - 10 reps - Supine Quad Set  - 2 x daily - 7 x weekly - 2 sets - 10 reps - 5 hold - Long Sitting Quad Set with Towel Roll Under Heel  - 2 x daily - 7 x weekly - 2 sets - 10 reps - 5 hold - Supine Active Straight Leg Raise  - 2 x daily - 7 x weekly - 32 sets - 10 reps - Supine Bridge  - 2 x daily - 7 x weekly - 2 sets - 10 reps - Seated  Long Arc Quad  - 2 x daily - 7 x weekly - 2 sets - 10 reps - Standing Heel Raise with Support  - 2 x daily - 7 x weekly - 2 sets - 10 reps - Standing Knee Flexion with Counter Support  - 2 x daily - 7 x weekly - 2 sets - 10 reps - Mini Squat with Counter Support  - 2 x daily - 7 x weekly - 2 sets - 10 reps  ASSESSMENT:  CLINICAL IMPRESSION: Patient needed VC for new exercises.  Demonstrated understanding.  OBJECTIVE IMPAIRMENTS: decreased balance, decreased mobility, difficulty walking, decreased ROM, decreased strength, increased edema, impaired flexibility, and pain.   ACTIVITY LIMITATIONS: standing, squatting, sleeping, stairs, and transfers  PARTICIPATION LIMITATIONS: meal prep, driving, and community activity  PERSONAL FACTORS: None are also affecting patient's functional outcome.   REHAB POTENTIAL: Good  CLINICAL DECISION MAKING: Stable/uncomplicated  EVALUATION COMPLEXITY: Moderate   GOALS: Goals reviewed with patient? Yes  SHORT TERM GOALS: Target date: 10/19/2024   Patient to be independent with HEP. Baseline: Goal status: INITIAL  2.  Decrease pain by 1 level. Baseline:  Goal status: INITIAL  LONG TERM GOALS: Target date: 12/07/2024  10 weeks    Patient to be independent with self progressive HEP at discharge. Baseline:  Goal status: INITIAL  2.  Decrease max pain to 2 out of 10 or less with all activities. Baseline:  Goal status: INITIAL  3.  Patient able to ambulate short community distances without assistive device and good ambulation pattern. Baseline:  Goal status: INITIAL  4.  Increase active range of motion to 0>115 or greater at discharge. Baseline:  Goal status: INITIAL  5.  Increase strength by half a grade to 1 full grade in affected lower extremity to assist with all ADLs. Baseline:  Goal status: INITIAL  6.  Increase score of PSFS by 3 points or more to indicate measurable difference. Baseline:  Goal status: INITIAL   PLAN:  PT  FREQUENCY: 2x/week  PT DURATION: 10 weeks  PLANNED INTERVENTIONS: 97164- PT Re-evaluation, 97110-Therapeutic exercises, 97530- Therapeutic activity, [id number]- Neuromuscular re-education, [phone]- Self Care, 02859- Manual therapy, [phone]- Gait training, [phone]- Aquatic Therapy, [phone]- Electrical stimulation (unattended), 97016- Vasopneumatic device, 20560 (1-2 muscles), 20561 (3+ muscles)- Dry Needling, Patient/Family education, Balance training, Stair training, Joint mobilization, Scar mobilization, DME instructions, Cryotherapy, and Moist heat  PLAN FOR NEXT SESSION: Continue with increasing ROM and strengthening exercises.   Burnard CHRISTELLA Meth, PT 10/06/2024, 1:47 PM  "

## 2024-10-05 ENCOUNTER — Inpatient Hospital Stay
Admission: RE | Admit: 2024-10-05 | Discharge: 2024-10-05 | Disposition: A | Source: Ambulatory Visit | Attending: Family Medicine

## 2024-10-05 DIAGNOSIS — Z122 Encounter for screening for malignant neoplasm of respiratory organs: Secondary | ICD-10-CM

## 2024-10-05 DIAGNOSIS — Z87891 Personal history of nicotine dependence: Secondary | ICD-10-CM

## 2024-10-05 DIAGNOSIS — R911 Solitary pulmonary nodule: Secondary | ICD-10-CM

## 2024-10-06 ENCOUNTER — Ambulatory Visit

## 2024-10-06 ENCOUNTER — Ambulatory Visit (INDEPENDENT_AMBULATORY_CARE_PROVIDER_SITE_OTHER)

## 2024-10-06 DIAGNOSIS — R600 Localized edema: Secondary | ICD-10-CM

## 2024-10-06 DIAGNOSIS — M6281 Muscle weakness (generalized): Secondary | ICD-10-CM

## 2024-10-06 DIAGNOSIS — M25561 Pain in right knee: Secondary | ICD-10-CM

## 2024-10-06 DIAGNOSIS — R262 Difficulty in walking, not elsewhere classified: Secondary | ICD-10-CM | POA: Diagnosis not present

## 2024-10-06 DIAGNOSIS — M25661 Stiffness of right knee, not elsewhere classified: Secondary | ICD-10-CM | POA: Diagnosis not present

## 2024-10-12 ENCOUNTER — Encounter: Admitting: Physical Therapy

## 2024-10-14 ENCOUNTER — Ambulatory Visit (INDEPENDENT_AMBULATORY_CARE_PROVIDER_SITE_OTHER)

## 2024-10-14 DIAGNOSIS — R262 Difficulty in walking, not elsewhere classified: Secondary | ICD-10-CM | POA: Diagnosis not present

## 2024-10-14 DIAGNOSIS — R600 Localized edema: Secondary | ICD-10-CM

## 2024-10-14 DIAGNOSIS — M25661 Stiffness of right knee, not elsewhere classified: Secondary | ICD-10-CM | POA: Diagnosis not present

## 2024-10-14 DIAGNOSIS — M25561 Pain in right knee: Secondary | ICD-10-CM

## 2024-10-14 DIAGNOSIS — M6281 Muscle weakness (generalized): Secondary | ICD-10-CM

## 2024-10-14 NOTE — Therapy (Signed)
 " OUTPATIENT PHYSICAL THERAPY LOWER EXTREMITY TREATMENT   Patient Name: MAXENE BYINGTON MRN: [id number] DOB:[birth year], 74 y.o., female Today's Date: 10/14/2024  END OF SESSION:  PT End of Session - 10/14/24 1201     Visit Number 3    Number of Visits 21    Date for Recertification  12/07/24    Authorization Type Medicare/[employer]    PT Start Time 1158    PT Stop Time 1234    PT Time Calculation (min) 36 min    Activity Tolerance Patient tolerated treatment well    Behavior During Therapy [facility] for tasks assessed/performed            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   Bipolar 1 disorder (HCC)    Bronchitis    hx   COPD (chronic obstructive pulmonary disease) (HCC)    Coronary artery disease 08/04/2024   Depression    Dyspnea    with exertion   Osteoarthritis 03/10/2024   Pneumonia    Pre-diabetes    PTSD (post-traumatic stress disorder)    Sleep apnea 12/2023   mild, no CPAP   Uses walker    Past Surgical History:  Procedure Laterality Date   bone spurs foot Right    CATARACT EXTRACTION Bilateral    COLONOSCOPY  07/2022   x several   HAND SURGERY Left    TONSILLECTOMY AND ADENOIDECTOMY     TOTAL HIP ARTHROPLASTY Left 03/19/2019   Procedure: LEFT TOTAL HIP ARTHROPLASTY ANTERIOR APPROACH;  Surgeon: Vernetta Lonni GRADE, MD;  Location: [facility];  Service: Orthopedics;  Laterality: Left;   TOTAL HIP ARTHROPLASTY Right 06/04/2019   Procedure: RIGHT TOTAL HIP ARTHROPLASTY ANTERIOR APPROACH;  Surgeon: Vernetta Lonni GRADE, MD;  Location: [facility];  Service: Orthopedics;  Laterality: Right;   TOTAL KNEE ARTHROPLASTY Left 12/28/2021   Procedure: Left TOTAL KNEE ARTHROPLASTY;  Surgeon: Vernetta Lonni GRADE, MD;  Location: [facility];   Service: Orthopedics;  Laterality: Left;   TOTAL KNEE ARTHROPLASTY Right 08/26/2024   Procedure: ARTHROPLASTY, KNEE, TOTAL;  Surgeon: Teresa Rankin ORN, MD;  Location: [facility];  Service: Orthopedics;  Laterality: Right;   Patient Active Problem List   Diagnosis Date Noted   Status post total knee replacement 08/26/2024   Claudication 08/04/2024   Coronary artery disease involving native coronary artery of native heart without angina pectoris 03/08/2024   Aortic atherosclerosis 03/08/2024   Unilateral primary osteoarthritis, right knee 12/31/2023   Hyperlipidemia 07/04/2023   Diabetes mellitus without complication (HCC) 08/09/2022   Restless legs syndrome 08/09/2022   Status post total left knee replacement 12/28/2021   Primary osteoarthritis of left knee 10/25/2021   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) 11/30/2020   Status post total replacement of right hip 06/04/2019   Status post total replacement of left hip 03/19/2019   PTSD (post-traumatic stress disorder)    COPD (chronic obstructive pulmonary disease) (HCC)    Bipolar 1 disorder (HCC)    Anxiety    B12 deficiency 12/09/2018   Hyperglycemia 04/14/2017    PCP: Ozell Heron HERO, MD  REFERRING PROVIDER: Rankin Teresa MD   REFERRING DIAG:  Diagnosis  [phone] (ICD-10-CM) - Other specified postprocedural states   THERAPY DIAG:  Acute  pain of right knee  Muscle weakness (generalized)  Difficulty in walking, not elsewhere classified  Localized edema  Stiffness of right knee, not elsewhere classified  Rationale for Evaluation and Treatment: Rehabilitation  ONSET DATE: Surgery-R TKA 08/26/24   SUBJECTIVE:   SUBJECTIVE STATEMENT: Patient reports that MD told her she is ahead of the game.   PERTINENT HISTORY: L TKA 3 years ago PAIN:  Are you having pain? Yes: NPRS scale: 1/10 for annoyance, not pain Pain location: R knee Pain description: anterior Aggravating factors: prolonged sitting,  standing, stairs, ambulation Relieving factors: rest, ice  PRECAUTIONS: None  RED FLAGS: None   WEIGHT BEARING RESTRICTIONS: No  FALLS:  Has patient fallen in last 6 months? Not asked  LIVING ENVIRONMENT: Lives with: lives alone Lives in: House/apartment Stairs: Yes: External: 4 steps; on right going up Has following equipment at home: Single point cane  OCCUPATION: retired  PLOF: Independent  PATIENT GOALS: increase activity  NEXT MD VISIT: unknown  OBJECTIVE:  Note: Objective measures were completed at Evaluation unless otherwise noted.  DIAGNOSTIC FINDINGS: n  PATIENT SURVEYS:  PSFS: THE PATIENT SPECIFIC FUNCTIONAL SCALE  Place score of 0-10 (0 = unable to perform activity and 10 = able to perform activity at the same level as before injury or problem)  Activity Date: 09/27/24    Out of car 8    2.walking in grocery store 3    3.stairs without handrail 1    4.      Total Score 4      Total Score = Sum of activity scores/number of activities  Minimally Detectable Change: 3 points (for single activity); 2 points (for average score)  [employer] [facility] [facility] (nd). The Patient Specific Functional Scale . Retrieved from [email or web address]   COGNITION: Overall cognitive status: Within functional limits for tasks assessed     SENSATION: WFL  EDEMA:  Circumferential: 41 cm  MUSCLE LENGTH: Hamstrings: Right mild restriction  POSTURE: No Significant postural limitations  PALPATION: 1+ tenderness jt line  LOWER EXTREMITY ROM:  Active/Passive ROM Right eval Left eval  Hip flexion    Hip extension    Hip abduction    Hip adduction    Hip internal rotation    Hip external rotation    Knee flexion Supine 110A/115P   Knee extension Supine +9<0 A   Ankle dorsiflexion    Ankle plantarflexion    Ankle inversion    Ankle eversion     (Blank rows = not tested)  LOWER EXTREMITY MMT:  MMT  Right eval Left eval  Hip flexion 4   Hip extension    Hip abduction 4   Hip adduction    Hip internal rotation    Hip external rotation    Knee flexion 4+   Knee extension 4   Ankle dorsiflexion    Ankle plantarflexion    Ankle inversion    Ankle eversion     (Blank rows = not tested)  LOWER EXTREMITY SPECIAL TESTS:  No special tests - post surgical  FUNCTIONAL TESTS:  5 times sit to stand: 16  GAIT: Distance walked: household  Assistive device utilized: Single point cane and Walker - 2 wheeled Level of assistance: Complete Independence Comments: ---  TREATMENT DATE: R TKA 10/14/2024 TherEx:  Recumbent bike with full revolutions for 8 minutes; 1 minute break to rest secondary to fatigue and soreness in bilat knees  Seated SLR 2x15 with 2s holds performed with bilat LE   TherAct: Bilat leg press 2x15 with 75#  Unilat leg press 2x8 with 25# performed with bilat LE Step up and over with 6 step with bilat UE support on // bars 2x8 leading with Rt up and Lt down Sit<>stands from 18 armchair without UE use 1x10    10/06/24 Sci fit at seat 11 full circles 5 min In // bars with 6 step for function and balance 15x stepping up, 10x stepping laterally  Lean ins for inc flexion 10x on 6 step Leg Press 3x10 bilaterally #75 Sit to stand with 2# ball and 20 seat height 10x Seated SLR 2# 3x10 Supine SAQ 3x10  #2 Vaso: at 36 degrees and med compression for 10 min   09/27/24 Initial evaluation of right knee completed followed by instruction and trial set of HEP.   PATIENT EDUCATION:  Education details: HEP Person educated: Patient Education method: Programmer, Multimedia, Facilities Manager, Actor cues, Verbal cues, and Handouts Education comprehension: verbalized understanding, returned demonstration, and verbal cues required  HOME EXERCISE  PROGRAM: Access Code: [email or web address] URL: [email or web address] Date: 09/27/2024 Prepared by: Burnard Meth  Exercises - Supine Heel Slide  - 2 x daily - 7 x weekly - 2 sets - 10 reps - Supine Quad Set  - 2 x daily - 7 x weekly - 2 sets - 10 reps - 5 hold - Long Sitting Quad Set with Towel Roll Under Heel  - 2 x daily - 7 x weekly - 2 sets - 10 reps - 5 hold - Supine Active Straight Leg Raise  - 2 x daily - 7 x weekly - 32 sets - 10 reps - Supine Bridge  - 2 x daily - 7 x weekly - 2 sets - 10 reps - Seated Long Arc Quad  - 2 x daily - 7 x weekly - 2 sets - 10 reps - Standing Heel Raise with Support  - 2 x daily - 7 x weekly - 2 sets - 10 reps - Standing Knee Flexion with Counter Support  - 2 x daily - 7 x weekly - 2 sets - 10 reps - Mini Squat with Counter Support  - 2 x daily - 7 x weekly - 2 sets - 10 reps  ASSESSMENT:  CLINICAL IMPRESSION: Patient arrived late to session endorsing no pain, but increased soreness/stiffness in bilat knees. Patient tolerated all activities this date, though required increased rest breaks due to cardiorespiratory fatigue. Patient will continue to benefit from skilled PT.  OBJECTIVE IMPAIRMENTS: decreased balance, decreased mobility, difficulty walking, decreased ROM, decreased strength, increased edema, impaired flexibility, and pain.   ACTIVITY LIMITATIONS: standing, squatting, sleeping, stairs, and transfers  PARTICIPATION LIMITATIONS: meal prep, driving, and community activity  PERSONAL FACTORS: None are also affecting patient's functional outcome.   REHAB POTENTIAL: Good  CLINICAL DECISION MAKING: Stable/uncomplicated  EVALUATION COMPLEXITY: Moderate   GOALS: Goals reviewed with patient? Yes  SHORT TERM GOALS: Target date: 10/19/2024   Patient to be independent with HEP. Baseline: Goal status: INITIAL  2.  Decrease pain by 1 level. Baseline:  Goal status: INITIAL  LONG TERM GOALS: Target date: 12/07/2024  10 weeks    Patient  to be independent with self progressive HEP at discharge. Baseline:  Goal status: INITIAL  2.  Decrease max pain  to 2 out of 10 or less with all activities. Baseline:  Goal status: INITIAL  3.  Patient able to ambulate short community distances without assistive device and good ambulation pattern. Baseline:  Goal status: INITIAL  4.  Increase active range of motion to 0>115 or greater at discharge. Baseline:  Goal status: INITIAL  5.  Increase strength by half a grade to 1 full grade in affected lower extremity to assist with all ADLs. Baseline:  Goal status: INITIAL  6.  Increase score of PSFS by 3 points or more to indicate measurable difference. Baseline:  Goal status: INITIAL   PLAN:  PT FREQUENCY: 2x/week  PT DURATION: 10 weeks  PLANNED INTERVENTIONS: 97164- PT Re-evaluation, 97110-Therapeutic exercises, 97530- Therapeutic activity, 97112- Neuromuscular re-education, 97535- Self Care, 02859- Manual therapy, [phone]- Gait training, [phone]- Aquatic Therapy, [phone]- Electrical stimulation (unattended), 97016- Vasopneumatic device, 20560 (1-2 muscles), 20561 (3+ muscles)- Dry Needling, Patient/Family education, Balance training, Stair training, Joint mobilization, Scar mobilization, DME instructions, Cryotherapy, and Moist heat  PLAN FOR NEXT SESSION: Continue with increasing ROM and strengthening exercises.    Susannah Daring, PT, DPT 10/14/2024 12:36 PM   "

## 2024-10-15 ENCOUNTER — Other Ambulatory Visit: Payer: Self-pay | Admitting: Acute Care

## 2024-10-15 DIAGNOSIS — Z122 Encounter for screening for malignant neoplasm of respiratory organs: Secondary | ICD-10-CM

## 2024-10-15 DIAGNOSIS — Z87891 Personal history of nicotine dependence: Secondary | ICD-10-CM

## 2024-10-18 ENCOUNTER — Encounter: Payer: Self-pay | Admitting: Physical Therapy

## 2024-10-18 ENCOUNTER — Encounter: Admitting: Physical Therapy

## 2024-10-18 DIAGNOSIS — M6281 Muscle weakness (generalized): Secondary | ICD-10-CM | POA: Diagnosis not present

## 2024-10-18 DIAGNOSIS — R262 Difficulty in walking, not elsewhere classified: Secondary | ICD-10-CM

## 2024-10-18 DIAGNOSIS — M25661 Stiffness of right knee, not elsewhere classified: Secondary | ICD-10-CM | POA: Diagnosis not present

## 2024-10-18 DIAGNOSIS — R600 Localized edema: Secondary | ICD-10-CM | POA: Diagnosis not present

## 2024-10-18 DIAGNOSIS — M25561 Pain in right knee: Secondary | ICD-10-CM

## 2024-10-18 NOTE — Therapy (Signed)
 " OUTPATIENT PHYSICAL THERAPY LOWER EXTREMITY TREATMENT   Patient Name: Judy [name] MRN: [id number] DOB:19-Dec-1950, 74 y.o., female Today's Date: 10/18/2024  END OF SESSION:  PT End of Session - 10/18/24 1311     Visit Number 4    Number of Visits 21    Date for Recertification  12/07/24    Authorization Type Medicare/[employer]    Progress Note Due on Visit 10    PT Start Time 1308   pt arrived late   PT Stop Time 1346    PT Time Calculation (min) 38 min    Activity Tolerance Patient tolerated treatment well    Behavior During Therapy [facility] for tasks assessed/performed             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   Bipolar 1 disorder (HCC)    Bronchitis    hx   COPD (chronic obstructive pulmonary disease) (HCC)    Coronary artery disease 08/04/2024   Depression    Dyspnea    with exertion   Osteoarthritis 03/10/2024   Pneumonia    Pre-diabetes    PTSD (post-traumatic stress disorder)    Sleep apnea 12/2023   mild, no CPAP   Uses walker    Past Surgical History:  Procedure Laterality Date   bone spurs foot Right    CATARACT EXTRACTION Bilateral    COLONOSCOPY  07/2022   x several   HAND SURGERY Left    TONSILLECTOMY AND ADENOIDECTOMY     TOTAL HIP ARTHROPLASTY Left 03/19/2019   Procedure: LEFT TOTAL HIP ARTHROPLASTY ANTERIOR APPROACH;  Surgeon: Vernetta Lonni GRADE, MD;  Location: [facility];  Service: Orthopedics;  Laterality: Left;   TOTAL HIP ARTHROPLASTY Right 06/04/2019   Procedure: RIGHT TOTAL HIP ARTHROPLASTY ANTERIOR APPROACH;  Surgeon: Vernetta Lonni GRADE, MD;  Location: [facility];  Service: Orthopedics;  Laterality: Right;   TOTAL KNEE ARTHROPLASTY Left 12/28/2021   Procedure: Left TOTAL KNEE ARTHROPLASTY;   Surgeon: Vernetta Lonni GRADE, MD;  Location: [facility];  Service: Orthopedics;  Laterality: Left;   TOTAL KNEE ARTHROPLASTY Right 08/26/2024   Procedure: ARTHROPLASTY, KNEE, TOTAL;  Surgeon: Teresa Rankin ORN, MD;  Location: [facility];  Service: Orthopedics;  Laterality: Right;   Patient Active Problem List   Diagnosis Date Noted   Status post total knee replacement 08/26/2024   Claudication 08/04/2024   Coronary artery disease involving native coronary artery of native heart without angina pectoris 03/08/2024   Aortic atherosclerosis 03/08/2024   Unilateral primary osteoarthritis, right knee 12/31/2023   Hyperlipidemia 07/04/2023   Diabetes mellitus without complication (HCC) 08/09/2022   Restless legs syndrome 08/09/2022   Status post total left knee replacement 12/28/2021   Primary osteoarthritis of left knee 10/25/2021   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) 11/30/2020   Status post total replacement of right hip 06/04/2019   Status post total replacement of left hip 03/19/2019   PTSD (post-traumatic stress disorder)    COPD (chronic obstructive pulmonary disease) (HCC)    Bipolar 1 disorder (HCC)    Anxiety    B12 deficiency 12/09/2018   Hyperglycemia 04/14/2017    PCP: Ozell Heron HERO, MD  REFERRING PROVIDER: Rankin Teresa MD   REFERRING DIAG:  Diagnosis  Z98.890 (ICD-10-CM) - Other specified postprocedural states   THERAPY DIAG:  Acute pain of right knee  Muscle weakness (generalized)  Difficulty in walking, not elsewhere classified  Localized edema  Stiffness of right knee, not elsewhere classified  Rationale for Evaluation and Treatment: Rehabilitation  ONSET DATE: Surgery-R TKA 08/26/24   SUBJECTIVE:   SUBJECTIVE STATEMENT: Doing pretty well, knee feels pretty good  PERTINENT HISTORY: L TKA 3 years ago PAIN:  Are you having pain? Yes: NPRS scale: 1/10 for annoyance, not pain Pain location: R knee Pain description:  anterior Aggravating factors: prolonged sitting, standing, stairs, ambulation Relieving factors: rest, ice  PRECAUTIONS: None  RED FLAGS: None   WEIGHT BEARING RESTRICTIONS: No  FALLS:  Has patient fallen in last 6 months? Not asked  LIVING ENVIRONMENT: Lives with: lives alone Lives in: House/apartment Stairs: Yes: External: 4 steps; on right going up Has following equipment at home: Single point cane  OCCUPATION: retired  PLOF: Independent  PATIENT GOALS: increase activity  NEXT MD VISIT: unknown  OBJECTIVE:  Note: Objective measures were completed at Evaluation unless otherwise noted.  DIAGNOSTIC FINDINGS: n  PATIENT SURVEYS:  PSFS: THE PATIENT SPECIFIC FUNCTIONAL SCALE  Place score of 0-10 (0 = unable to perform activity and 10 = able to perform activity at the same level as before injury or problem)  Activity Date: 09/27/24    Out of car 8    2.walking in grocery store 3    3.stairs without handrail 1    4.      Total Score 4      Total Score = Sum of activity scores/number of activities  Minimally Detectable Change: 3 points (for single activity); 2 points (for average score)  [facility] (nd). The Patient Specific Functional Scale . Retrieved from [email or web address].[email or web address]   COGNITION: Overall cognitive status: Within functional limits for tasks assessed     SENSATION: WFL  EDEMA:  Circumferential: 41 cm  MUSCLE LENGTH: Hamstrings: Right mild restriction  POSTURE: No Significant postural limitations  PALPATION: 1+ tenderness jt line  LOWER EXTREMITY ROM:  Active/Passive ROM Right eval Left eval  Hip flexion    Hip extension    Hip abduction    Hip adduction    Hip internal rotation    Hip external rotation    Knee flexion Supine 110A/115P   Knee extension Supine +9<0 A   Ankle dorsiflexion    Ankle plantarflexion    Ankle inversion    Ankle eversion     (Blank rows =  not tested)  LOWER EXTREMITY MMT:  MMT Right eval Left eval  Hip flexion 4   Hip extension    Hip abduction 4   Hip adduction    Hip internal rotation    Hip external rotation    Knee flexion 4+   Knee extension 4   Ankle dorsiflexion    Ankle plantarflexion    Ankle inversion    Ankle eversion     (Blank rows = not tested)  LOWER EXTREMITY SPECIAL TESTS:  No special tests - post surgical  FUNCTIONAL TESTS:  5 times sit to stand: 16  GAIT: Distance walked: household  Assistive device utilized: Single point cane and Walker - 2 wheeled Level of assistance: Complete Independence Comments: ---  TREATMENT DATE: R TKA 10/18/24 TherAct NuStep UE/LEs x 8 min; L5, seat 10 for ROM and strength Knee extension 10# 3x10; bil concentric; RLE only eccentric Hamstring curl 15# 3x10; RLE only Leg press bil 81# 3x10; then RLE only 31# 3x10 Sit to/from stand 2x5; from slightly elevated mat table   10/14/2024 TherEx:  Recumbent bike with full revolutions for 8 minutes; 1 minute break to rest secondary to fatigue and soreness in bilat knees  Seated SLR 2x15 with 2s holds performed with bilat LE   TherAct: Bilat leg press 2x15 with 75#  Unilat leg press 2x8 with 25# performed with bilat LE Step up and over with 6 step with bilat UE support on // bars 2x8 leading with Rt up and Lt down Sit<>stands from 18 armchair without UE use 1x10    10/06/24 Sci fit at seat 11 full circles 5 min In // bars with 6 step for function and balance 15x stepping up, 10x stepping laterally  Lean ins for inc flexion 10x on 6 step Leg Press 3x10 bilaterally #75 Sit to stand with 2# ball and 20 seat height 10x Seated SLR 2# 3x10 Supine SAQ 3x10  #2 Vaso: at 36 degrees and med compression for 10 min   09/27/24 Initial evaluation of right knee completed followed by  instruction and trial set of HEP.   PATIENT EDUCATION:  Education details: HEP Person educated: Patient Education method: Programmer, Multimedia, Facilities Manager, Actor cues, Verbal cues, and Handouts Education comprehension: verbalized understanding, returned demonstration, and verbal cues required  HOME EXERCISE PROGRAM: Access Code: [email or web address] URL: [email or web address] Date: 09/27/2024 Prepared by: Burnard Meth  Exercises - Supine Heel Slide  - 2 x daily - 7 x weekly - 2 sets - 10 reps - Supine Quad Set  - 2 x daily - 7 x weekly - 2 sets - 10 reps - 5 hold - Long Sitting Quad Set with Towel Roll Under Heel  - 2 x daily - 7 x weekly - 2 sets - 10 reps - 5 hold - Supine Active Straight Leg Raise  - 2 x daily - 7 x weekly - 32 sets - 10 reps - Supine Bridge  - 2 x daily - 7 x weekly - 2 sets - 10 reps - Seated Long Arc Quad  - 2 x daily - 7 x weekly - 2 sets - 10 reps - Standing Heel Raise with Support  - 2 x daily - 7 x weekly - 2 sets - 10 reps - Standing Knee Flexion with Counter Support  - 2 x daily - 7 x weekly - 2 sets - 10 reps - Mini Squat with Counter Support  - 2 x daily - 7 x weekly - 2 sets - 10 reps  ASSESSMENT:  CLINICAL IMPRESSION: Pt tolerated session well today with expected muscle fatigue noted at end of session.  Still recommend SPC for long distance amb due to occasional buckling without LOB.  Continue skilled PT.   OBJECTIVE IMPAIRMENTS: decreased balance, decreased mobility, difficulty walking, decreased ROM, decreased strength, increased edema, impaired flexibility, and pain.   ACTIVITY LIMITATIONS: standing, squatting, sleeping, stairs, and transfers  PARTICIPATION LIMITATIONS: meal prep, driving, and community activity  PERSONAL FACTORS: None are also affecting patient's functional outcome.   REHAB POTENTIAL: Good  CLINICAL DECISION MAKING: Stable/uncomplicated  EVALUATION COMPLEXITY: Moderate   GOALS: Goals reviewed with patient? Yes  SHORT  TERM GOALS: Target date: 10/19/2024   Patient to be independent with HEP. Baseline: Goal status: INITIAL  2.  Decrease pain by 1 level. Baseline:  Goal status: INITIAL  LONG TERM GOALS: Target date: 12/07/2024  10 weeks    Patient to be independent with self progressive HEP at discharge. Baseline:  Goal status: INITIAL  2.  Decrease max pain to 2 out of 10 or less with all activities. Baseline:  Goal status: INITIAL  3.  Patient able to ambulate short community distances without assistive device and good ambulation pattern. Baseline:  Goal status: INITIAL  4.  Increase active range of motion to 0>115 or greater at discharge. Baseline:  Goal status: INITIAL  5.  Increase strength by half a grade to 1 full grade in affected lower extremity to assist with all ADLs. Baseline:  Goal status: INITIAL  6.  Increase score of PSFS by 3 points or more to indicate measurable difference. Baseline:  Goal status: INITIAL   PLAN:  PT FREQUENCY: 2x/week  PT DURATION: 10 weeks  PLANNED INTERVENTIONS: 97164- PT Re-evaluation, 97110-Therapeutic exercises, 97530- Therapeutic activity, 97112- Neuromuscular re-education, 97535- Self Care, 02859- Manual therapy, [phone]- Gait training, [phone]- Aquatic Therapy, [phone]- Electrical stimulation (unattended), 97016- Vasopneumatic device, 20560 (1-2 muscles), 20561 (3+ muscles)- Dry Needling, Patient/Family education, Balance training, Stair training, Joint mobilization, Scar mobilization, DME instructions, Cryotherapy, and Moist heat  PLAN FOR NEXT SESSION: check STGs,  Continue with increasing ROM and strengthening exercises.    [name], PT, DPT 10/18/2024 1:48 PM   "

## 2024-10-19 NOTE — Therapy (Incomplete)
 " OUTPATIENT PHYSICAL THERAPY LOWER EXTREMITY TREATMENT   Patient Name: Judy [name] MRN: [id number] DOB:[birth year], 74 y.o., female Today's Date: 10/19/2024  END OF SESSION:       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   Bipolar 1 disorder (HCC)    Bronchitis    hx   COPD (chronic obstructive pulmonary disease) (HCC)    Coronary artery disease 08/04/2024   Depression    Dyspnea    with exertion   Osteoarthritis 03/10/2024   Pneumonia    Pre-diabetes    PTSD (post-traumatic stress disorder)    Sleep apnea 12/2023   mild, no CPAP   Uses walker    Past Surgical History:  Procedure Laterality Date   bone spurs foot Right    CATARACT EXTRACTION Bilateral    COLONOSCOPY  07/2022   x several   HAND SURGERY Left    TONSILLECTOMY AND ADENOIDECTOMY     TOTAL HIP ARTHROPLASTY Left 03/19/2019   Procedure: LEFT TOTAL HIP ARTHROPLASTY ANTERIOR APPROACH;  Surgeon: Vernetta Lonni GRADE, MD;  Location: [facility];  Service: Orthopedics;  Laterality: Left;   TOTAL HIP ARTHROPLASTY Right 06/04/2019   Procedure: RIGHT TOTAL HIP ARTHROPLASTY ANTERIOR APPROACH;  Surgeon: Vernetta Lonni GRADE, MD;  Location: [facility];  Service: Orthopedics;  Laterality: Right;   TOTAL KNEE ARTHROPLASTY Left 12/28/2021   Procedure: Left TOTAL KNEE ARTHROPLASTY;  Surgeon: Vernetta Lonni GRADE, MD;  Location: [facility];  Service: Orthopedics;  Laterality: Left;   TOTAL KNEE ARTHROPLASTY Right 08/26/2024   Procedure: ARTHROPLASTY, KNEE, TOTAL;  Surgeon: Teresa Rankin ORN, MD;  Location: [facility];  Service: Orthopedics;  Laterality: Right;   Patient Active Problem List   Diagnosis Date Noted   Status post total knee replacement 08/26/2024   Claudication 08/04/2024   Coronary artery  disease involving native coronary artery of native heart without angina pectoris 03/08/2024   Aortic atherosclerosis 03/08/2024   Unilateral primary osteoarthritis, right knee 12/31/2023   Hyperlipidemia 07/04/2023   Diabetes mellitus without complication (HCC) 08/09/2022   Restless legs syndrome 08/09/2022   Status post total left knee replacement 12/28/2021   Primary osteoarthritis of left knee 10/25/2021   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) 11/30/2020   Status post total replacement of right hip 06/04/2019   Status post total replacement of left hip 03/19/2019   PTSD (post-traumatic stress disorder)    COPD (chronic obstructive pulmonary disease) (HCC)    Bipolar 1 disorder (HCC)    Anxiety    B12 deficiency 12/09/2018   Hyperglycemia 04/14/2017    PCP: Ozell Heron HERO, MD  REFERRING PROVIDER: Rankin Teresa MD   REFERRING DIAG:  Diagnosis  [phone] (ICD-10-CM) - Other specified postprocedural states   THERAPY DIAG:  No diagnosis found.  Rationale for Evaluation and Treatment: Rehabilitation  ONSET DATE: Surgery-R TKA 08/26/24   SUBJECTIVE:   SUBJECTIVE STATEMENT: ***Doing pretty well, knee feels pretty good  PERTINENT HISTORY: L TKA 3 years ago PAIN:  ***Are you having pain? Yes: NPRS scale: 1/10 for annoyance, not pain Pain location: R knee Pain description: anterior Aggravating factors: prolonged sitting, standing, stairs, ambulation Relieving factors: rest, ice  PRECAUTIONS: None  RED FLAGS: None   WEIGHT BEARING RESTRICTIONS: No  FALLS:  Has patient fallen in last 6 months? Not asked  LIVING ENVIRONMENT: Lives with: lives alone Lives in: House/apartment Stairs: Yes: External: 4 steps; on right going up Has following equipment at home: Single point cane  OCCUPATION: retired  PLOF: Independent  PATIENT GOALS: increase activity  NEXT MD VISIT: unknown  OBJECTIVE:  Note: Objective measures were completed at Evaluation  unless otherwise noted.  DIAGNOSTIC FINDINGS: n  PATIENT SURVEYS:  PSFS: THE PATIENT SPECIFIC FUNCTIONAL SCALE  Place score of 0-10 (0 = unable to perform activity and 10 = able to perform activity at the same level as before injury or problem)  Activity Date: 09/27/24    Out of car 8    2.walking in grocery store 3    3.stairs without handrail 1    4.      Total Score 4      Total Score = Sum of activity scores/number of activities  Minimally Detectable Change: 3 points (for single activity); 2 points (for average score)  [employer] [facility] (nd). The Patient Specific Functional Scale . Retrieved from [email or web address].[email or web address]   COGNITION: Overall cognitive status: Within functional limits for tasks assessed     SENSATION: WFL  EDEMA:  Circumferential: 41 cm  MUSCLE LENGTH: Hamstrings: Right mild restriction  POSTURE: No Significant postural limitations  PALPATION: 1+ tenderness jt line  LOWER EXTREMITY ROM:  Active/Passive ROM Right eval Left eval  Hip flexion    Hip extension    Hip abduction    Hip adduction    Hip internal rotation    Hip external rotation    Knee flexion Supine 110A/115P   Knee extension Supine +9<0 A   Ankle dorsiflexion    Ankle plantarflexion    Ankle inversion    Ankle eversion     (Blank rows = not tested)  LOWER EXTREMITY MMT:  MMT Right eval Left eval  Hip flexion 4   Hip extension    Hip abduction 4   Hip adduction    Hip internal rotation    Hip external rotation    Knee flexion 4+   Knee extension 4   Ankle dorsiflexion    Ankle plantarflexion    Ankle inversion    Ankle eversion     (Blank rows = not tested)  LOWER EXTREMITY SPECIAL TESTS:  No special tests - post surgical  FUNCTIONAL TESTS:  5 times sit to stand: 16  GAIT: Distance walked: household  Assistive device utilized: Single point cane and Walker - 2 wheeled Level of assistance: Complete  Independence Comments: ---                                                                                                                                TREATMENT DATE: R TKA 10/20/24***        10/18/24 TherAct NuStep UE/LEs x 8 min; L5, seat 10 for ROM and strength Knee extension 10# 3x10; bil concentric; RLE  only eccentric Hamstring curl 15# 3x10; RLE only Leg press bil 81# 3x10; then RLE only 31# 3x10 Sit to/from stand 2x5; from slightly elevated mat table   10/14/2024 TherEx:  Recumbent bike with full revolutions for 8 minutes; 1 minute break to rest secondary to fatigue and soreness in bilat knees  Seated SLR 2x15 with 2s holds performed with bilat LE   TherAct: Bilat leg press 2x15 with 75#  Unilat leg press 2x8 with 25# performed with bilat LE Step up and over with 6 step with bilat UE support on // bars 2x8 leading with Rt up and Lt down Sit<>stands from 18 armchair without UE use 1x10    10/06/24 Sci fit at seat 11 full circles 5 min In // bars with 6 step for function and balance 15x stepping up, 10x stepping laterally  Lean ins for inc flexion 10x on 6 step Leg Press 3x10 bilaterally #75 Sit to stand with 2# ball and 20 seat height 10x Seated SLR 2# 3x10 Supine SAQ 3x10  #2 Vaso: at 36 degrees and med compression for 10 min   09/27/24 Initial evaluation of right knee completed followed by instruction and trial set of HEP.   PATIENT EDUCATION:  Education details: HEP Person educated: Patient Education method: Programmer, Multimedia, Facilities Manager, Actor cues, Verbal cues, and Handouts Education comprehension: verbalized understanding, returned demonstration, and verbal cues required  HOME EXERCISE PROGRAM: Access Code: [email or web address] URL: [email or web address] Date: 09/27/2024 Prepared by: Burnard Meth  Exercises - Supine Heel Slide  - 2 x daily - 7 x weekly - 2 sets - 10 reps - Supine Quad Set  - 2 x daily - 7 x weekly - 2 sets - 10 reps - 5  hold - Long Sitting Quad Set with Towel Roll Under Heel  - 2 x daily - 7 x weekly - 2 sets - 10 reps - 5 hold - Supine Active Straight Leg Raise  - 2 x daily - 7 x weekly - 32 sets - 10 reps - Supine Bridge  - 2 x daily - 7 x weekly - 2 sets - 10 reps - Seated Long Arc Quad  - 2 x daily - 7 x weekly - 2 sets - 10 reps - Standing Heel Raise with Support  - 2 x daily - 7 x weekly - 2 sets - 10 reps - Standing Knee Flexion with Counter Support  - 2 x daily - 7 x weekly - 2 sets - 10 reps - Mini Squat with Counter Support  - 2 x daily - 7 x weekly - 2 sets - 10 reps  ASSESSMENT:  CLINICAL IMPRESSION: ***Pt tolerated session well today with expected muscle fatigue noted at end of session.  Still recommend SPC for long distance amb due to occasional buckling without LOB.  Continue skilled PT.   OBJECTIVE IMPAIRMENTS: decreased balance, decreased mobility, difficulty walking, decreased ROM, decreased strength, increased edema, impaired flexibility, and pain.   ACTIVITY LIMITATIONS: standing, squatting, sleeping, stairs, and transfers  PARTICIPATION LIMITATIONS: meal prep, driving, and community activity  PERSONAL FACTORS: None are also affecting patient's functional outcome.   REHAB POTENTIAL: Good  CLINICAL DECISION MAKING: Stable/uncomplicated  EVALUATION COMPLEXITY: Moderate   GOALS: Goals reviewed with patient? Yes  SHORT TERM GOALS: Target date: 10/19/2024***   Patient to be independent with HEP. Baseline: Goal status: INITIAL  2.  Decrease pain by 1 level. Baseline:  Goal status: INITIAL  LONG TERM GOALS: Target date: 12/07/2024  10 weeks    Patient  to be independent with self progressive HEP at discharge. Baseline:  Goal status: INITIAL  2.  Decrease max pain to 2 out of 10 or less with all activities. Baseline:  Goal status: INITIAL  3.  Patient able to ambulate short community distances without assistive device and good ambulation pattern. Baseline:  Goal status:  INITIAL  4.  Increase active range of motion to 0>115 or greater at discharge. Baseline:  Goal status: INITIAL  5.  Increase strength by half a grade to 1 full grade in affected lower extremity to assist with all ADLs. Baseline:  Goal status: INITIAL  6.  Increase score of PSFS by 3 points or more to indicate measurable difference. Baseline:  Goal status: INITIAL   PLAN:  PT FREQUENCY: 2x/week  PT DURATION: 10 weeks  PLANNED INTERVENTIONS: 97164- PT Re-evaluation, 97110-Therapeutic exercises, 97530- Therapeutic activity, 97112- Neuromuscular re-education, 97535- Self Care, 02859- Manual therapy, [phone]- Gait training, [phone]- Aquatic Therapy, [phone]- Electrical stimulation (unattended), 97016- Vasopneumatic device, 20560 (1-2 muscles), 20561 (3+ muscles)- Dry Needling, Patient/Family education, Balance training, Stair training, Joint mobilization, Scar mobilization, DME instructions, Cryotherapy, and Moist heat  PLAN FOR NEXT SESSION: ***check STGs,  Continue with increasing ROM and strengthening exercises.   Burnard Meth, PT 10/19/2024  6:55 AM   "

## 2024-10-20 ENCOUNTER — Encounter

## 2024-10-20 ENCOUNTER — Telehealth: Payer: Self-pay

## 2024-10-20 NOTE — Telephone Encounter (Signed)
 Missed appointment Called phone # [phone] and the recording stated call  could not go through. Unable to speak to patient or leave message.

## 2024-10-25 ENCOUNTER — Encounter: Payer: Self-pay | Admitting: Physical Therapy

## 2024-10-25 ENCOUNTER — Ambulatory Visit: Admitting: Physical Therapy

## 2024-10-25 DIAGNOSIS — R262 Difficulty in walking, not elsewhere classified: Secondary | ICD-10-CM | POA: Diagnosis not present

## 2024-10-25 DIAGNOSIS — M25561 Pain in right knee: Secondary | ICD-10-CM | POA: Diagnosis not present

## 2024-10-25 DIAGNOSIS — M6281 Muscle weakness (generalized): Secondary | ICD-10-CM

## 2024-10-25 DIAGNOSIS — M25661 Stiffness of right knee, not elsewhere classified: Secondary | ICD-10-CM | POA: Diagnosis not present

## 2024-10-25 DIAGNOSIS — R600 Localized edema: Secondary | ICD-10-CM | POA: Diagnosis not present

## 2024-10-25 NOTE — Therapy (Signed)
 " OUTPATIENT PHYSICAL THERAPY LOWER EXTREMITY TREATMENT   Patient Name: Judy [name] MRN: [id number] DOB:Feb 20, 1951, 74 y.o., female Today's Date: 10/25/2024  END OF SESSION:  PT End of Session - 10/25/24 1150     Visit Number 5    Number of Visits 21    Date for Recertification  12/07/24    Authorization Type Medicare/[employer]    Progress Note Due on Visit 10    PT Start Time 1145    PT Stop Time 1223    PT Time Calculation (min) 38 min    Activity Tolerance Patient tolerated treatment well    Behavior During Therapy [facility] for tasks assessed/performed              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   Bipolar 1 disorder (HCC)    Bronchitis    hx   COPD (chronic obstructive pulmonary disease) (HCC)    Coronary artery disease 08/04/2024   Depression    Dyspnea    with exertion   Osteoarthritis 03/10/2024   Pneumonia    Pre-diabetes    PTSD (post-traumatic stress disorder)    Sleep apnea 12/2023   mild, no CPAP   Uses walker    Past Surgical History:  Procedure Laterality Date   bone spurs foot Right    CATARACT EXTRACTION Bilateral    COLONOSCOPY  07/2022   x several   HAND SURGERY Left    TONSILLECTOMY AND ADENOIDECTOMY     TOTAL HIP ARTHROPLASTY Left 03/19/2019   Procedure: LEFT TOTAL HIP ARTHROPLASTY ANTERIOR APPROACH;  Surgeon: Vernetta Lonni GRADE, MD;  Location: [facility];  Service: Orthopedics;  Laterality: Left;   TOTAL HIP ARTHROPLASTY Right 06/04/2019   Procedure: RIGHT TOTAL HIP ARTHROPLASTY ANTERIOR APPROACH;  Surgeon: Vernetta Lonni GRADE, MD;  Location: [facility];  Service: Orthopedics;  Laterality: Right;   TOTAL KNEE ARTHROPLASTY Left 12/28/2021   Procedure: Left TOTAL KNEE ARTHROPLASTY;  Surgeon: Vernetta Lonni GRADE, MD;  Location: [facility];  Service: Orthopedics;  Laterality: Left;   TOTAL KNEE ARTHROPLASTY Right 08/26/2024   Procedure: ARTHROPLASTY, KNEE, TOTAL;  Surgeon: Teresa Rankin ORN, MD;  Location: [facility];  Service: Orthopedics;  Laterality: Right;   Patient Active Problem List   Diagnosis Date Noted   Status post total knee replacement 08/26/2024   Claudication 08/04/2024   Coronary artery disease involving native coronary artery of native heart without angina pectoris 03/08/2024   Aortic atherosclerosis 03/08/2024   Unilateral primary osteoarthritis, right knee 12/31/2023   Hyperlipidemia 07/04/2023   Diabetes mellitus without complication (HCC) 08/09/2022   Restless legs syndrome 08/09/2022   Status post total left knee replacement 12/28/2021   Primary osteoarthritis of left knee 10/25/2021   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) 11/30/2020   Status post total replacement of right hip 06/04/2019   Status post total replacement of left hip 03/19/2019   PTSD (post-traumatic stress disorder)    COPD (chronic obstructive pulmonary disease) (HCC)    Bipolar 1 disorder (HCC)    Anxiety    B12 deficiency 12/09/2018   Hyperglycemia 04/14/2017    PCP: Ozell Heron HERO, MD  REFERRING PROVIDER: Rankin Teresa MD   REFERRING DIAG:  Diagnosis  [phone] (ICD-10-CM) -  Other specified postprocedural states   THERAPY DIAG:  Acute pain of right knee  Muscle weakness (generalized)  Difficulty in walking, not elsewhere classified  Localized edema  Stiffness of right knee, not elsewhere classified  Rationale for Evaluation and Treatment: Rehabilitation  ONSET DATE: Surgery-R TKA 08/26/24   SUBJECTIVE:   SUBJECTIVE STATEMENT: Isn't sleeping well; had muscle cramps last night  PERTINENT HISTORY: L TKA 3 years ago  PAIN:  Are you having pain? Yes: NPRS scale: 3/10 for annoyance, not pain Pain location: R knee Pain description: anterior; tight and  numb Aggravating factors: prolonged sitting, standing, stairs, ambulation Relieving factors: rest, ice  PRECAUTIONS: None  RED FLAGS: None   WEIGHT BEARING RESTRICTIONS: No  FALLS:  Has patient fallen in last 6 months? Not asked  LIVING ENVIRONMENT: Lives with: lives alone Lives in: House/apartment Stairs: Yes: External: 4 steps; on right going up Has following equipment at home: Single point cane  OCCUPATION: retired  PLOF: Independent  PATIENT GOALS: increase activity  NEXT MD VISIT: unknown  OBJECTIVE:  Note: Objective measures were completed at Evaluation unless otherwise noted.  DIAGNOSTIC FINDINGS: n  PATIENT SURVEYS:  PSFS: THE PATIENT SPECIFIC FUNCTIONAL SCALE  Place score of 0-10 (0 = unable to perform activity and 10 = able to perform activity at the same level as before injury or problem)  Activity Date: 09/27/24    Out of car 8    2.walking in grocery store 3    3.stairs without handrail 1    4.      Total Score 4      Total Score = Sum of activity scores/number of activities  Minimally Detectable Change: 3 points (for single activity); 2 points (for average score)  [facility] (nd). The Patient Specific Functional Scale . Retrieved from Skateoasis.com.pt   COGNITION: Overall cognitive status: Within functional limits for tasks assessed     SENSATION: WFL  EDEMA:  Circumferential: 41 cm  MUSCLE LENGTH: Hamstrings: Right mild restriction  POSTURE: No Significant postural limitations  PALPATION: 1+ tenderness jt line  LOWER EXTREMITY ROM:  Active/Passive ROM Right eval Left eval  Knee flexion Supine 110A/115P   Knee extension Supine +9<0 A    (Blank rows = not tested)  LOWER EXTREMITY MMT:  MMT Right eval Left eval  Hip flexion 4   Hip extension    Hip abduction 4   Hip adduction    Hip internal rotation    Hip external rotation    Knee flexion 4+    Knee extension 4   Ankle dorsiflexion    Ankle plantarflexion    Ankle inversion    Ankle eversion     (Blank rows = not tested)  LOWER EXTREMITY SPECIAL TESTS:  No special tests - post surgical  FUNCTIONAL TESTS:  5 times sit to stand: 16 sec  GAIT: Distance walked: household  Assistive device utilized: Single point cane and Walker - 2 wheeled Level of assistance: Complete Independence Comments: ---  TREATMENT DATE: R TKA 10/25/24 TherAct NuStep L6 x 8 min for ROM and strengthening Knee extension 10# 3x10; 1st set RLE only; last 2 sets bil concentric; RLE only eccentric Hamstring curl 15# 3x10; RLE only Sit to/from stand 2x10 with 12# KB Standing hip abduction L3 band 2x10 bil Standing hip extension L3 band 2x10 bil Seated SLR 2x10 on Rt; 3 sec hold     10/18/24 TherAct NuStep UE/LEs x 8 min; L5, seat 10 for ROM and strength Knee extension 10# 3x10; bil concentric; RLE only eccentric Hamstring curl 15# 3x10; RLE only Leg press bil 81# 3x10; then RLE only 31# 3x10 Sit to/from stand 2x5; from slightly elevated mat table   10/14/2024 TherEx:  Recumbent bike with full revolutions for 8 minutes; 1 minute break to rest secondary to fatigue and soreness in bilat knees  Seated SLR 2x15 with 2s holds performed with bilat LE   TherAct: Bilat leg press 2x15 with 75#  Unilat leg press 2x8 with 25# performed with bilat LE Step up and over with 6 step with bilat UE support on // bars 2x8 leading with Rt up and Lt down Sit<>stands from 18 armchair without UE use 1x10    PATIENT EDUCATION:  Education details: HEP Person educated: Patient Education method: Programmer, Multimedia, Facilities Manager, Actor cues, Verbal cues, and Handouts Education comprehension: verbalized understanding, returned demonstration, and verbal cues required  HOME EXERCISE  PROGRAM: Access Code: [email or web address] URL: [email or web address] Date: 09/27/2024 Prepared by: Burnard Meth  Exercises - Supine Heel Slide  - 2 x daily - 7 x weekly - 2 sets - 10 reps - Supine Quad Set  - 2 x daily - 7 x weekly - 2 sets - 10 reps - 5 hold - Long Sitting Quad Set with Towel Roll Under Heel  - 2 x daily - 7 x weekly - 2 sets - 10 reps - 5 hold - Supine Active Straight Leg Raise  - 2 x daily - 7 x weekly - 32 sets - 10 reps - Supine Bridge  - 2 x daily - 7 x weekly - 2 sets - 10 reps - Seated Long Arc Quad  - 2 x daily - 7 x weekly - 2 sets - 10 reps - Standing Heel Raise with Support  - 2 x daily - 7 x weekly - 2 sets - 10 reps - Standing Knee Flexion with Counter Support  - 2 x daily - 7 x weekly - 2 sets - 10 reps - Mini Squat with Counter Support  - 2 x daily - 7 x weekly - 2 sets - 10 reps  ASSESSMENT:  CLINICAL IMPRESSION: Pt tolerated session well, did have some fatigue but this is likely due to limited sleep.  Continue skilled PT at this time.    OBJECTIVE IMPAIRMENTS: decreased balance, decreased mobility, difficulty walking, decreased ROM, decreased strength, increased edema, impaired flexibility, and pain.   ACTIVITY LIMITATIONS: standing, squatting, sleeping, stairs, and transfers  PARTICIPATION LIMITATIONS: meal prep, driving, and community activity  PERSONAL FACTORS: None are also affecting patient's functional outcome.   REHAB POTENTIAL: Good  CLINICAL DECISION MAKING: Stable/uncomplicated  EVALUATION COMPLEXITY: Moderate   GOALS: Goals reviewed with patient? Yes  SHORT TERM GOALS: Target date: 10/19/2024   Patient to be independent with HEP. Baseline: Goal status: MET 10/25/24  2.  Decrease pain by 1 level. Baseline:  Goal status: PARTIALLY MET 10/25/24  LONG TERM GOALS: Target date: 12/07/2024  10 weeks    Patient to be independent with  self progressive HEP at discharge. Baseline:  Goal status: INITIAL  2.  Decrease max pain to  2 out of 10 or less with all activities. Baseline:  Goal status: INITIAL  3.  Patient able to ambulate short community distances without assistive device and good ambulation pattern. Baseline:  Goal status: INITIAL  4.  Increase active range of motion to 0>115 or greater at discharge. Baseline:  Goal status: INITIAL  5.  Increase strength by half a grade to 1 full grade in affected lower extremity to assist with all ADLs. Baseline:  Goal status: INITIAL  6.  Increase score of PSFS by 3 points or more to indicate measurable difference. Baseline:  Goal status: INITIAL   PLAN:  PT FREQUENCY: 2x/week  PT DURATION: 10 weeks  PLANNED INTERVENTIONS: 97164- PT Re-evaluation, 97110-Therapeutic exercises, 97530- Therapeutic activity, 97112- Neuromuscular re-education, 97535- Self Care, 02859- Manual therapy, [phone]- Gait training, [phone]- Aquatic Therapy, [phone]- Electrical stimulation (unattended), 97016- Vasopneumatic device, 20560 (1-2 muscles), 20561 (3+ muscles)- Dry Needling, Patient/Family education, Balance training, Stair training, Joint mobilization, Scar mobilization, DME instructions, Cryotherapy, and Moist heat  PLAN FOR NEXT SESSION:  Continue with increasing ROM and strengthening exercises.   [name], PT, DPT 10/25/24 12:40 PM  "

## 2024-10-27 ENCOUNTER — Ambulatory Visit: Admitting: Physical Therapy

## 2024-10-27 ENCOUNTER — Telehealth: Payer: Self-pay

## 2024-10-27 ENCOUNTER — Other Ambulatory Visit (HOSPITAL_COMMUNITY): Payer: Self-pay

## 2024-10-27 DIAGNOSIS — M25561 Pain in right knee: Secondary | ICD-10-CM

## 2024-10-27 DIAGNOSIS — R262 Difficulty in walking, not elsewhere classified: Secondary | ICD-10-CM | POA: Diagnosis not present

## 2024-10-27 DIAGNOSIS — R600 Localized edema: Secondary | ICD-10-CM

## 2024-10-27 DIAGNOSIS — M6281 Muscle weakness (generalized): Secondary | ICD-10-CM

## 2024-10-27 DIAGNOSIS — M25551 Pain in right hip: Secondary | ICD-10-CM

## 2024-10-27 DIAGNOSIS — R293 Abnormal posture: Secondary | ICD-10-CM | POA: Diagnosis not present

## 2024-10-27 DIAGNOSIS — M5459 Other low back pain: Secondary | ICD-10-CM

## 2024-10-27 DIAGNOSIS — M25661 Stiffness of right knee, not elsewhere classified: Secondary | ICD-10-CM | POA: Diagnosis not present

## 2024-10-27 NOTE — Telephone Encounter (Signed)
 Pharmacy Patient Advocate Encounter   Received notification from [facility] that prior authorization for Mounjar[employer]  5 is required/requested.   Insurance verification completed.   The patient is insured through [facility].   Per test claim: The current 28 day co-pay is, $717.92.  No PA needed at this time. This test claim was processed through [facility]- copay amounts may vary at other pharmacies due to pharmacy/plan contracts, or as the patient moves through the different stages of their insurance plan.

## 2024-10-27 NOTE — Therapy (Signed)
 " OUTPATIENT PHYSICAL THERAPY LOWER EXTREMITY TREATMENT   Patient Name: Judy [name] MRN: [id number] DOB:[birth year], 74 y.o., female Today's Date: 10/27/2024  END OF SESSION:  PT End of Session - 10/27/24 1307     Visit Number 6    Number of Visits 21    Date for Recertification  12/07/24    Authorization Type Medicare/[employer]    Progress Note Due on Visit 10    PT Start Time 1307   late sign in   PT Stop Time 1345    PT Time Calculation (min) 38 min    Activity Tolerance Patient tolerated treatment well    Behavior During Therapy [facility] for tasks assessed/performed              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   Bipolar 1 disorder (HCC)    Bronchitis    hx   COPD (chronic obstructive pulmonary disease) (HCC)    Coronary artery disease 08/04/2024   Depression    Dyspnea    with exertion   Osteoarthritis 03/10/2024   Pneumonia    Pre-diabetes    PTSD (post-traumatic stress disorder)    Sleep apnea 12/2023   mild, no CPAP   Uses walker    Past Surgical History:  Procedure Laterality Date   bone spurs foot Right    CATARACT EXTRACTION Bilateral    COLONOSCOPY  07/2022   x several   HAND SURGERY Left    TONSILLECTOMY AND ADENOIDECTOMY     TOTAL HIP ARTHROPLASTY Left 03/19/2019   Procedure: LEFT TOTAL HIP ARTHROPLASTY ANTERIOR APPROACH;  Surgeon: Vernetta Lonni GRADE, MD;  Location: [facility];  Service: Orthopedics;  Laterality: Left;   TOTAL HIP ARTHROPLASTY Right 06/04/2019   Procedure: RIGHT TOTAL HIP ARTHROPLASTY ANTERIOR APPROACH;  Surgeon: Vernetta Lonni GRADE, MD;  Location: [facility];  Service: Orthopedics;  Laterality: Right;   TOTAL KNEE ARTHROPLASTY Left 12/28/2021   Procedure: Left TOTAL KNEE ARTHROPLASTY;  Surgeon:  Vernetta Lonni GRADE, MD;  Location: [facility];  Service: Orthopedics;  Laterality: Left;   TOTAL KNEE ARTHROPLASTY Right 08/26/2024   Procedure: ARTHROPLASTY, KNEE, TOTAL;  Surgeon: Teresa Rankin ORN, MD;  Location: [facility];  Service: Orthopedics;  Laterality: Right;   Patient Active Problem List   Diagnosis Date Noted   Status post total knee replacement 08/26/2024   Claudication 08/04/2024   Coronary artery disease involving native coronary artery of native heart without angina pectoris 03/08/2024   Aortic atherosclerosis 03/08/2024   Unilateral primary osteoarthritis, right knee 12/31/2023   Hyperlipidemia 07/04/2023   Diabetes mellitus without complication (HCC) 08/09/2022   Restless legs syndrome 08/09/2022   Status post total left knee replacement 12/28/2021   Primary osteoarthritis of left knee 10/25/2021   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) 11/30/2020   Status post total replacement of right hip 06/04/2019   Status post total replacement of left hip 03/19/2019   PTSD (post-traumatic stress disorder)    COPD (chronic obstructive pulmonary disease) (HCC)    Bipolar 1 disorder (HCC)    Anxiety    B12 deficiency 12/09/2018   Hyperglycemia 04/14/2017    PCP: Ozell Heron HERO, MD  REFERRING PROVIDER: Rankin Teresa MD   REFERRING DIAG:  Diagnosis  Z98.890 (ICD-10-CM) - Other specified postprocedural states   THERAPY DIAG:  No diagnosis found.  Rationale for Evaluation and Treatment: Rehabilitation  ONSET DATE: Surgery-R TKA 08/26/24   SUBJECTIVE:   SUBJECTIVE STATEMENT: Pt reports back is hurting her more than her knee today. Back pain 7.5. Knee pain is pretty good at a 2. Has n  PERTINENT HISTORY: L TKA 3 years ago  PAIN:  Are you having pain? Yes: NPRS scale: 3/10 for annoyance, not pain Pain location: R knee Pain description: anterior; tight and numb Aggravating factors: prolonged sitting, standing, stairs, ambulation Relieving  factors: rest, ice  PRECAUTIONS: None  RED FLAGS: None   WEIGHT BEARING RESTRICTIONS: No  FALLS:  Has patient fallen in last 6 months? Not asked  LIVING ENVIRONMENT: Lives with: lives alone Lives in: House/apartment Stairs: Yes: External: 4 steps; on right going up Has following equipment at home: Single point cane  OCCUPATION: retired  PLOF: Independent  PATIENT GOALS: increase activity  NEXT MD VISIT: unknown  OBJECTIVE:  Note: Objective measures were completed at Evaluation unless otherwise noted.  DIAGNOSTIC FINDINGS: n  PATIENT SURVEYS:  PSFS: THE PATIENT SPECIFIC FUNCTIONAL SCALE  Place score of 0-10 (0 = unable to perform activity and 10 = able to perform activity at the same level as before injury or problem)  Activity Date: 09/27/24    Out of car 8    2.walking in grocery store 3    3.stairs without handrail 1    4.      Total Score 4      Total Score = Sum of activity scores/number of activities  Minimally Detectable Change: 3 points (for single activity); 2 points (for average score)  [facility] [facility] (nd). The Patient Specific Functional Scale . Retrieved from Skateoasis.com.pt   COGNITION: Overall cognitive status: Within functional limits for tasks assessed     SENSATION: WFL  EDEMA:  Circumferential: 41 cm  MUSCLE LENGTH: Hamstrings: Right mild restriction  POSTURE: No Significant postural limitations  PALPATION: 1+ tenderness jt line  LOWER EXTREMITY ROM:  Active/Passive ROM Right eval Left eval  Knee flexion Supine 110A/115P   Knee extension Supine +9<0 A    (Blank rows = not tested)  LOWER EXTREMITY MMT:  MMT Right eval Left eval  Hip flexion 4   Hip extension    Hip abduction 4   Hip adduction    Hip internal rotation    Hip external rotation    Knee flexion 4+   Knee extension 4   Ankle dorsiflexion    Ankle plantarflexion    Ankle inversion     Ankle eversion     (Blank rows = not tested)  LOWER EXTREMITY SPECIAL TESTS:  No special tests - post surgical  FUNCTIONAL TESTS:  5 times sit to stand: 16 sec  GAIT: Distance walked: household  Assistive device utilized: Single point cane and Walker - 2 wheeled Level of assistance: Complete Independence Comments: ---  TREATMENT DATE: R TKA 10/27/24 Supine heel slide with strap x 4 min  Knee ext 15# x10 bilat concentric, R LE eccentrics 2x10 Hamstring curl 25# 3x10 bilat concentric Attempted fwd step tap on 4 box; however, pt reports R knee buckling Leg press 100# 3x10 bilat, 50# x10 R LE only Vaso x10 min R knee, 34 deg. Moist heat pack for low back   10/25/24 TherAct NuStep L6 x 8 min for ROM and strengthening Knee extension 10# 3x10; 1st set RLE only; last 2 sets bil concentric; RLE only eccentric Hamstring curl 15# 3x10; RLE only Sit to/from stand 2x10 with 12# KB Standing hip abduction L3 band 2x10 bil Standing hip extension L3 band 2x10 bil Seated SLR 2x10 on Rt; 3 sec hold     10/18/24 TherAct NuStep UE/LEs x 8 min; L5, seat 10 for ROM and strength Knee extension 10# 3x10; bil concentric; RLE only eccentric Hamstring curl 15# 3x10; RLE only Leg press bil 81# 3x10; then RLE only 31# 3x10 Sit to/from stand 2x5; from slightly elevated mat table    PATIENT EDUCATION:  Education details: HEP Person educated: Patient Education method: Programmer, Multimedia, Facilities Manager, Actor cues, Verbal cues, and Handouts Education comprehension: verbalized understanding, returned demonstration, and verbal cues required  HOME EXERCISE PROGRAM: Access Code: [email or web address] URL: [email or web address] Date: 09/27/2024 Prepared by: Burnard Meth  Exercises - Supine Heel Slide  - 2 x daily - 7 x weekly - 2 sets - 10 reps - Supine Quad Set  - 2 x  daily - 7 x weekly - 2 sets - 10 reps - 5 hold - Long Sitting Quad Set with Towel Roll Under Heel  - 2 x daily - 7 x weekly - 2 sets - 10 reps - 5 hold - Supine Active Straight Leg Raise  - 2 x daily - 7 x weekly - 32 sets - 10 reps - Supine Bridge  - 2 x daily - 7 x weekly - 2 sets - 10 reps - Seated Long Arc Quad  - 2 x daily - 7 x weekly - 2 sets - 10 reps - Standing Heel Raise with Support  - 2 x daily - 7 x weekly - 2 sets - 10 reps - Standing Knee Flexion with Counter Support  - 2 x daily - 7 x weekly - 2 sets - 10 reps - Mini Squat with Counter Support  - 2 x daily - 7 x weekly - 2 sets - 10 reps  ASSESSMENT:  CLINICAL IMPRESSION: Pt notes increased back pain today limiting activity. Continued to work on progressing R LE strength and stability. Difficulty tolerating single limb stability on R without knee buckling. Provided ice and heat to address back pain.  OBJECTIVE IMPAIRMENTS: decreased balance, decreased mobility, difficulty walking, decreased ROM, decreased strength, increased edema, impaired flexibility, and pain.   ACTIVITY LIMITATIONS: standing, squatting, sleeping, stairs, and transfers  PARTICIPATION LIMITATIONS: meal prep, driving, and community activity  PERSONAL FACTORS: None are also affecting patient's functional outcome.   REHAB POTENTIAL: Good  CLINICAL DECISION MAKING: Stable/uncomplicated  EVALUATION COMPLEXITY: Moderate   GOALS: Goals reviewed with patient? Yes  SHORT TERM GOALS: Target date: 10/19/2024   Patient to be independent with HEP. Baseline: Goal status: MET 10/25/24  2.  Decrease pain by 1 level. Baseline:  Goal status: PARTIALLY MET 10/25/24  LONG TERM GOALS: Target date: 12/07/2024  10 weeks    Patient to be independent with self progressive HEP at discharge. Baseline:  Goal status: INITIAL  2.  Decrease max pain to 2 out of 10 or less with all activities. Baseline:  Goal status: INITIAL  3.  Patient able to ambulate short  community distances without assistive device and good ambulation pattern. Baseline:  Goal status: INITIAL  4.  Increase active range of motion to 0>115 or greater at discharge. Baseline:  Goal status: INITIAL  5.  Increase strength by half a grade to 1 full grade in affected lower extremity to assist with all ADLs. Baseline:  Goal status: INITIAL  6.  Increase score of PSFS by 3 points or more to indicate measurable difference. Baseline:  Goal status: INITIAL   PLAN:  PT FREQUENCY: 2x/week  PT DURATION: 10 weeks  PLANNED INTERVENTIONS: 97164- PT Re-evaluation, 97110-Therapeutic exercises, 97530- Therapeutic activity, 97112- Neuromuscular re-education, 97535- Self Care, 02859- Manual therapy, [phone]- Gait training, [phone]- Aquatic Therapy, [phone]- Electrical stimulation (unattended), 97016- Vasopneumatic device, 20560 (1-2 muscles), 20561 (3+ muscles)- Dry Needling, Patient/Family education, Balance training, Stair training, Joint mobilization, Scar mobilization, DME instructions, Cryotherapy, and Moist heat  PLAN FOR NEXT SESSION:  Continue with increasing ROM and strengthening exercises.   Tequisha Maahs April Ma L Puja Caffey, PT, DPT 10/27/24 1:07 PM  "

## 2024-11-01 ENCOUNTER — Encounter: Admitting: Physical Therapy

## 2024-11-03 ENCOUNTER — Encounter: Payer: Self-pay | Admitting: Physical Therapy

## 2024-11-03 ENCOUNTER — Ambulatory Visit: Admitting: Physical Therapy

## 2024-11-03 DIAGNOSIS — M25561 Pain in right knee: Secondary | ICD-10-CM | POA: Diagnosis not present

## 2024-11-03 DIAGNOSIS — R262 Difficulty in walking, not elsewhere classified: Secondary | ICD-10-CM

## 2024-11-03 DIAGNOSIS — M6281 Muscle weakness (generalized): Secondary | ICD-10-CM

## 2024-11-03 DIAGNOSIS — M25661 Stiffness of right knee, not elsewhere classified: Secondary | ICD-10-CM | POA: Diagnosis not present

## 2024-11-03 DIAGNOSIS — R600 Localized edema: Secondary | ICD-10-CM

## 2024-11-03 NOTE — Therapy (Signed)
 " OUTPATIENT PHYSICAL THERAPY LOWER EXTREMITY TREATMENT   Patient Name: Judy [name] MRN: [id number] DOB:[birth year], 74 y.o., female Today's Date: 11/03/2024  END OF SESSION:  PT End of Session - 11/03/24 1307     Visit Number 7    Number of Visits 21    Date for Recertification  12/07/24    Authorization Type Medicare/[employer]    Progress Note Due on Visit 10    PT Start Time 1307   late sign in   PT Stop Time 1345    PT Time Calculation (min) 38 min    Activity Tolerance Patient tolerated treatment well    Behavior During Therapy [facility] for tasks assessed/performed              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   Bipolar 1 disorder (HCC)    Bronchitis    hx   COPD (chronic obstructive pulmonary disease) (HCC)    Coronary artery disease 08/04/2024   Depression    Dyspnea    with exertion   Osteoarthritis 03/10/2024   Pneumonia    Pre-diabetes    PTSD (post-traumatic stress disorder)    Sleep apnea 12/2023   mild, no CPAP   Uses walker    Past Surgical History:  Procedure Laterality Date   bone spurs foot Right    CATARACT EXTRACTION Bilateral    COLONOSCOPY  07/2022   x several   HAND SURGERY Left    TONSILLECTOMY AND ADENOIDECTOMY     TOTAL HIP ARTHROPLASTY Left 03/19/2019   Procedure: LEFT TOTAL HIP ARTHROPLASTY ANTERIOR APPROACH;  Surgeon: Vernetta Lonni GRADE, MD;  Location: [facility];  Service: Orthopedics;  Laterality: Left;   TOTAL HIP ARTHROPLASTY Right 06/04/2019   Procedure: RIGHT TOTAL HIP ARTHROPLASTY ANTERIOR APPROACH;  Surgeon: Vernetta Lonni GRADE, MD;  Location: [facility];  Service: Orthopedics;  Laterality: Right;   TOTAL KNEE ARTHROPLASTY Left 12/28/2021   Procedure: Left TOTAL KNEE ARTHROPLASTY;  Surgeon:  Vernetta Lonni GRADE, MD;  Location: [facility];  Service: Orthopedics;  Laterality: Left;   TOTAL KNEE ARTHROPLASTY Right 08/26/2024   Procedure: ARTHROPLASTY, KNEE, TOTAL;  Surgeon: Teresa Rankin ORN, MD;  Location: [facility];  Service: Orthopedics;  Laterality: Right;   Patient Active Problem List   Diagnosis Date Noted   Status post total knee replacement 08/26/2024   Claudication 08/04/2024   Coronary artery disease involving native coronary artery of native heart without angina pectoris 03/08/2024   Aortic atherosclerosis 03/08/2024   Unilateral primary osteoarthritis, right knee 12/31/2023   Hyperlipidemia 07/04/2023   Diabetes mellitus without complication (HCC) 08/09/2022   Restless legs syndrome 08/09/2022   Status post total left knee replacement 12/28/2021   Primary osteoarthritis of left knee 10/25/2021   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) 11/30/2020   Status post total replacement of right hip 06/04/2019   Status post total replacement of left hip 03/19/2019   PTSD (post-traumatic stress disorder)    COPD (chronic obstructive pulmonary disease) (HCC)    Bipolar 1 disorder (HCC)    Anxiety    B12 deficiency 12/09/2018   Hyperglycemia 04/14/2017    PCP: Ozell Heron HERO, MD  REFERRING PROVIDER: Rankin Teresa MD   REFERRING DIAG:  Diagnosis  Z98.890 (ICD-10-CM) - Other specified postprocedural states   THERAPY DIAG:  Acute pain of right knee  Muscle weakness (generalized)  Difficulty in walking, not elsewhere classified  Localized edema  Stiffness of right knee, not elsewhere classified  Rationale for Evaluation and Treatment: Rehabilitation  ONSET DATE: Surgery-R TKA 08/26/24   SUBJECTIVE:   SUBJECTIVE STATEMENT: States she hasn't been out of the house to walk much because of the weather. Feels the front of her knees are a tingly numb. Knee feels pretty tight. Has been having problems with insomnia (not because of her knee).    PERTINENT HISTORY: L TKA 3 years ago  PAIN:  Are you having pain? Yes: NPRS scale: 3/10 for annoyance, not pain Pain location: R knee Pain description: anterior; tight and numb Aggravating factors: prolonged sitting, standing, stairs, ambulation Relieving factors: rest, ice  PRECAUTIONS: None  RED FLAGS: None   WEIGHT BEARING RESTRICTIONS: No  FALLS:  Has patient fallen in last 6 months? Not asked  LIVING ENVIRONMENT: Lives with: lives alone Lives in: House/apartment Stairs: Yes: External: 4 steps; on right going up Has following equipment at home: Single point cane  OCCUPATION: retired  PLOF: Independent  PATIENT GOALS: increase activity  NEXT MD VISIT: unknown  OBJECTIVE:  Note: Objective measures were completed at Evaluation unless otherwise noted.  DIAGNOSTIC FINDINGS: n  PATIENT SURVEYS:  PSFS: THE PATIENT SPECIFIC FUNCTIONAL SCALE  Place score of 0-10 (0 = unable to perform activity and 10 = able to perform activity at the same level as before injury or problem)  Activity Date: 09/27/24    Out of car 8    2.walking in grocery store 3    3.stairs without handrail 1    4.      Total Score 4      Total Score = Sum of activity scores/number of activities  Minimally Detectable Change: 3 points (for single activity); 2 points (for average score)  [facility] (nd). The Patient Specific Functional Scale . Retrieved from [email or web address]   COGNITION: Overall cognitive status: Within functional limits for tasks assessed     SENSATION: WFL  EDEMA:  Circumferential: 41 cm  MUSCLE LENGTH: Hamstrings: Right mild restriction  POSTURE: No Significant postural limitations  PALPATION: 1+ tenderness jt line  LOWER EXTREMITY ROM:  Active/Passive ROM Right eval Left eval  Knee flexion Supine 110A/115P   Knee extension Supine +9<0 A    (Blank rows = not tested)  LOWER  EXTREMITY MMT:  MMT Right eval Left eval  Hip flexion 4   Hip extension    Hip abduction 4   Hip adduction    Hip internal rotation    Hip external rotation    Knee flexion 4+   Knee extension 4   Ankle dorsiflexion    Ankle plantarflexion    Ankle inversion    Ankle eversion     (Blank rows = not tested)  LOWER EXTREMITY SPECIAL TESTS:  No special tests - post surgical  FUNCTIONAL TESTS:  5 times sit to stand: 16 sec  GAIT: Distance walked: household  Assistive device utilized: Single point cane and Walker - 2 wheeled Level of assistance: Complete Independence Comments: ---  TREATMENT DATE: R TKA 11/03/24 Nustep L6; x 4 min at seat 10, periodically pushed forward until seat 7 for a total of 10 min Fwd step tap on 6 box x20 R&L Side step tap on 6 box x20 R&L Self care: Scar massage at home/scar care Standing heel/toe raise x20 Fwd step down 6 box 2x10 with bilat UE support Vaso x10 min R knee, 34 deg, legs elevated  10/27/24 Supine heel slide with strap x 4 min  Knee ext 15# x10 bilat concentric, R LE eccentrics 2x10 Hamstring curl 25# 3x10 bilat concentric Attempted fwd step tap on 4 box; however, pt reports R knee buckling Leg press 100# 3x10 bilat, 50# x10 R LE only Vaso x10 min R knee, 34 deg. Moist heat pack for low back   10/25/24 TherAct NuStep L6 x 8 min for ROM and strengthening Knee extension 10# 3x10; 1st set RLE only; last 2 sets bil concentric; RLE only eccentric Hamstring curl 15# 3x10; RLE only Sit to/from stand 2x10 with 12# KB Standing hip abduction L3 band 2x10 bil Standing hip extension L3 band 2x10 bil Seated SLR 2x10 on Rt; 3 sec hold     10/18/24 TherAct NuStep UE/LEs x 8 min; L5, seat 10 for ROM and strength Knee extension 10# 3x10; bil concentric; RLE only eccentric Hamstring curl 15# 3x10; RLE  only Leg press bil 81# 3x10; then RLE only 31# 3x10 Sit to/from stand 2x5; from slightly elevated mat table    PATIENT EDUCATION:  Education details: HEP Person educated: Patient Education method: Programmer, Multimedia, Facilities Manager, Actor cues, Verbal cues, and Handouts Education comprehension: verbalized understanding, returned demonstration, and verbal cues required  HOME EXERCISE PROGRAM: Access Code: [email or web address] URL: [email or web address] Date: 09/27/2024 Prepared by: Burnard Meth  Exercises - Supine Heel Slide  - 2 x daily - 7 x weekly - 2 sets - 10 reps - Supine Quad Set  - 2 x daily - 7 x weekly - 2 sets - 10 reps - 5 hold - Long Sitting Quad Set with Towel Roll Under Heel  - 2 x daily - 7 x weekly - 2 sets - 10 reps - 5 hold - Supine Active Straight Leg Raise  - 2 x daily - 7 x weekly - 32 sets - 10 reps - Supine Bridge  - 2 x daily - 7 x weekly - 2 sets - 10 reps - Seated Long Arc Quad  - 2 x daily - 7 x weekly - 2 sets - 10 reps - Standing Heel Raise with Support  - 2 x daily - 7 x weekly - 2 sets - 10 reps - Standing Knee Flexion with Counter Support  - 2 x daily - 7 x weekly - 2 sets - 10 reps - Mini Squat with Counter Support  - 2 x daily - 7 x weekly - 2 sets - 10 reps  ASSESSMENT:  CLINICAL IMPRESSION: Back is feeling better today. Session focused primarily on knee mobility, stability and endurance. Pt fatigued with just 10 min on Nustep. Working on improving step clearance, quad control and balance for stair negotiation this session.   OBJECTIVE IMPAIRMENTS: decreased balance, decreased mobility, difficulty walking, decreased ROM, decreased strength, increased edema, impaired flexibility, and pain.   ACTIVITY LIMITATIONS: standing, squatting, sleeping, stairs, and transfers  PARTICIPATION LIMITATIONS: meal prep, driving, and community activity  PERSONAL FACTORS: None are also affecting patient's functional outcome.   REHAB POTENTIAL: Good  CLINICAL  DECISION MAKING: Stable/uncomplicated  EVALUATION COMPLEXITY: Moderate   GOALS:  Goals reviewed with patient? Yes  SHORT TERM GOALS: Target date: 10/19/2024   Patient to be independent with HEP. Baseline: Goal status: MET 10/25/24  2.  Decrease pain by 1 level. Baseline:  Goal status: PARTIALLY MET 10/25/24  LONG TERM GOALS: Target date: 12/07/2024  10 weeks    Patient to be independent with self progressive HEP at discharge. Baseline:  Goal status: INITIAL  2.  Decrease max pain to 2 out of 10 or less with all activities. Baseline:  Goal status: INITIAL  3.  Patient able to ambulate short community distances without assistive device and good ambulation pattern. Baseline:  Goal status: INITIAL  4.  Increase active range of motion to 0>115 or greater at discharge. Baseline:  Goal status: INITIAL  5.  Increase strength by half a grade to 1 full grade in affected lower extremity to assist with all ADLs. Baseline:  Goal status: INITIAL  6.  Increase score of PSFS by 3 points or more to indicate measurable difference. Baseline:  Goal status: INITIAL   PLAN:  PT FREQUENCY: 2x/week  PT DURATION: 10 weeks  PLANNED INTERVENTIONS: 97164- PT Re-evaluation, 97110-Therapeutic exercises, 97530- Therapeutic activity, 97112- Neuromuscular re-education, 97535- Self Care, 02859- Manual therapy, [phone]- Gait training, [phone]- Aquatic Therapy, [phone]- Electrical stimulation (unattended), 97016- Vasopneumatic device, 20560 (1-2 muscles), 20561 (3+ muscles)- Dry Needling, Patient/Family education, Balance training, Stair training, Joint mobilization, Scar mobilization, DME instructions, Cryotherapy, and Moist heat  PLAN FOR NEXT SESSION:  Continue with increasing ROM and strengthening exercises.   Addison Freimuth April Ma L Laikyn Gewirtz, PT, DPT 11/03/24 1:08 PM  "

## 2024-11-08 ENCOUNTER — Encounter: Admitting: Physical Therapy

## 2024-11-10 ENCOUNTER — Encounter: Payer: Self-pay | Admitting: Rehabilitative and Restorative Service Providers"

## 2024-11-10 ENCOUNTER — Ambulatory Visit: Admitting: Rehabilitative and Restorative Service Providers"

## 2024-11-10 DIAGNOSIS — M6281 Muscle weakness (generalized): Secondary | ICD-10-CM

## 2024-11-10 DIAGNOSIS — M25561 Pain in right knee: Secondary | ICD-10-CM | POA: Diagnosis not present

## 2024-11-10 DIAGNOSIS — R600 Localized edema: Secondary | ICD-10-CM | POA: Diagnosis not present

## 2024-11-10 DIAGNOSIS — M25661 Stiffness of right knee, not elsewhere classified: Secondary | ICD-10-CM | POA: Diagnosis not present

## 2024-11-10 DIAGNOSIS — R262 Difficulty in walking, not elsewhere classified: Secondary | ICD-10-CM

## 2024-11-10 NOTE — Therapy (Signed)
 " OUTPATIENT PHYSICAL THERAPY TREATMENT   Patient Name: Judy [name] MRN: [id number] DOB:[birth year], 74 y.o., female Today's Date: 11/10/2024  END OF SESSION:  PT End of Session - 11/10/24 1327     Visit Number 8    Number of Visits 21    Date for Recertification  12/07/24    Authorization Type Medicare/[employer]    Progress Note Due on Visit 10    PT Start Time 1321    PT Stop Time 1344    PT Time Calculation (min) 23 min    Activity Tolerance Patient tolerated treatment well    Behavior During Therapy [facility] for tasks assessed/performed               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   Bipolar 1 disorder (HCC)    Bronchitis    hx   COPD (chronic obstructive pulmonary disease) (HCC)    Coronary artery disease 08/04/2024   Depression    Dyspnea    with exertion   Osteoarthritis 03/10/2024   Pneumonia    Pre-diabetes    PTSD (post-traumatic stress disorder)    Sleep apnea 12/2023   mild, no CPAP   Uses walker    Past Surgical History:  Procedure Laterality Date   bone spurs foot Right    CATARACT EXTRACTION Bilateral    COLONOSCOPY  07/2022   x several   HAND SURGERY Left    TONSILLECTOMY AND ADENOIDECTOMY     TOTAL HIP ARTHROPLASTY Left 03/19/2019   Procedure: LEFT TOTAL HIP ARTHROPLASTY ANTERIOR APPROACH;  Surgeon: Vernetta Lonni GRADE, MD;  Location: [facility];  Service: Orthopedics;  Laterality: Left;   TOTAL HIP ARTHROPLASTY Right 06/04/2019   Procedure: RIGHT TOTAL HIP ARTHROPLASTY ANTERIOR APPROACH;  Surgeon: Vernetta Lonni GRADE, MD;  Location: [facility];  Service: Orthopedics;  Laterality: Right;   TOTAL KNEE ARTHROPLASTY Left 12/28/2021   Procedure: Left TOTAL KNEE ARTHROPLASTY;  Surgeon: Vernetta Lonni GRADE, MD;   Location: [facility];  Service: Orthopedics;  Laterality: Left;   TOTAL KNEE ARTHROPLASTY Right 08/26/2024   Procedure: ARTHROPLASTY, KNEE, TOTAL;  Surgeon: Teresa Rankin ORN, MD;  Location: [facility];  Service: Orthopedics;  Laterality: Right;   Patient Active Problem List   Diagnosis Date Noted   Status post total knee replacement 08/26/2024   Claudication 08/04/2024   Coronary artery disease involving native coronary artery of native heart without angina pectoris 03/08/2024   Aortic atherosclerosis 03/08/2024   Unilateral primary osteoarthritis, right knee 12/31/2023   Hyperlipidemia 07/04/2023   Diabetes mellitus without complication (HCC) 08/09/2022   Restless legs syndrome 08/09/2022   Status post total left knee replacement 12/28/2021   Primary osteoarthritis of left knee 10/25/2021   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) 11/30/2020   Status post total replacement of right hip 06/04/2019   Status post total replacement of left hip 03/19/2019   PTSD (post-traumatic stress disorder)    COPD (chronic obstructive pulmonary disease) (HCC)    Bipolar 1 disorder (HCC)    Anxiety    B12 deficiency 12/09/2018   Hyperglycemia 04/14/2017    PCP: Ozell Heron HERO, MD  REFERRING PROVIDER: Rankin Teresa MD   REFERRING DIAG:  Diagnosis  [phone] (ICD-10-CM) -  Other specified postprocedural states   THERAPY DIAG:  Acute pain of right knee  Muscle weakness (generalized)  Difficulty in walking, not elsewhere classified  Localized edema  Stiffness of right knee, not elsewhere classified  Rationale for Evaluation and Treatment: Rehabilitation  ONSET DATE: Surgery-R TKA 08/26/24   SUBJECTIVE:   SUBJECTIVE STATEMENT: Pt indicated feeling a little knee pain at times but overall feeling better with knee.  Reported leaving cane behind at times.   PERTINENT HISTORY: L TKA 3 years ago  PAIN:  NPRS scale: 1/10 Pain location: R knee Pain description: anterior; tight  and numb Aggravating factors: prolonged sitting, standing, stairs, ambulation Relieving factors: rest, ice  PRECAUTIONS: None  RED FLAGS: None   WEIGHT BEARING RESTRICTIONS: No  FALLS:  Has patient fallen in last 6 months? Not asked  LIVING ENVIRONMENT: Lives with: lives alone Lives in: House/apartment Stairs: Yes: External: 4 steps; on right going up Has following equipment at home: Single point cane  OCCUPATION: retired  PLOF: Independent  PATIENT GOALS: increase activity  NEXT MD VISIT: unknown  OBJECTIVE:  Note: Objective measures were completed at Evaluation unless otherwise noted.  DIAGNOSTIC FINDINGS: n  PATIENT SURVEYS:  PSFS: THE PATIENT SPECIFIC FUNCTIONAL SCALE  Place score of 0-10 (0 = unable to perform activity and 10 = able to perform activity at the same level as before injury or problem)  Activity Date: 09/27/24 11/10/2024   Out of car 8 10   2.walking in grocery store 3 7   3.stairs without handrail 1 2        Total Score 4 6.33 avg     Total Score = Sum of activity scores/number of activities  Minimally Detectable Change: 3 points (for single activity); 2 points (for average score)  [facility] (nd). The Patient Specific Functional Scale . Retrieved from [email or web address].[email or web address]   COGNITION: Eval: Overall cognitive status: Within functional limits for tasks assessed     SENSATION: Eval: WFL  EDEMA:  Eval: Circumferential: 41 cm  MUSCLE LENGTH: Eval: Hamstrings: Right mild restriction  POSTURE:  Eval: No Significant postural limitations  PALPATION: Eval: 1+ tenderness jt line  LOWER EXTREMITY ROM:  Active/Passive ROM Right eval Right 11/10/2024  Knee flexion Supine 110A/115P Supine Rt heel slide AROM 130  Knee extension Supine +9<0 A -3 in seated AROM LAQ    (Blank rows = not tested)  LOWER EXTREMITY MMT:  MMT Right eval Right eval  Hip flexion 4 5/5   Hip extension    Hip abduction 4   Hip adduction    Hip internal rotation    Hip external rotation    Knee flexion 4+ 5/5  Knee extension 4 5/5  Ankle dorsiflexion    Ankle plantarflexion    Ankle inversion    Ankle eversion     (Blank rows = not tested)  LOWER EXTREMITY SPECIAL TESTS:  Eval: No special tests - post surgical  FUNCTIONAL TESTS:  11/10/2024: TUG with cane:  12.7 seconds, TUG independent 11.66 seconds.   Eval: 5 times sit to stand: 16 sec  GAIT: 11/10/2024: SPC use for arrival but able to perform short household distances in clinic independent.  Some variability in stability noted.   Eval: Distance walked: household  Assistive device utilized: Single point Restaurant Manager, [employer] - 2 wheeled Level of assistance: Complete Independence Comments: ---  TREATMENT       DATE: 11/10/2024 Therex: Incline gastroc 30 sec x 3 bilaterally . Time spent with cues of techniques.    Neuro Re-ed: Feet together stance 1 min with SBA Modified tandem stance (foot forward )  30 seconds x 2 bilaterally with SBA, focus on stepping to correct balance if needed.  Standing feet together on foam with random external perts from clinician 1 min  Standing slow marching x 15 bilateral with single hand on bar with SBA   TherActivity: Step up forward 6 inch step with single hand rail assist x 10 bilaterally  Sit to stand to sit with 10 ft walk x 2 (once with cane, once independent)    TREATMENT       DATE:11/03/24 Nustep L6; x 4 min at seat 10, periodically pushed forward until seat 7 for a total of 10 min Fwd step tap on 6 box x20 R&L Side step tap on 6 box x20 R&L Self care: Scar massage at home/scar care Standing heel/toe raise x20 Fwd step down 6 box 2x10 with bilat UE support Vaso x10 min R knee, 34 deg, legs elevated  TREATMENT        DATE:10/27/24 Supine heel slide with strap x 4 min  Knee ext 15# x10 bilat concentric, R LE eccentrics 2x10 Hamstring curl 25# 3x10 bilat concentric Attempted fwd step tap on 4 box; however, pt reports R knee buckling Leg press 100# 3x10 bilat, 50# x10 R LE only Vaso x10 min R knee, 34 deg. Moist heat pack for low back   TREATMENT       DATE:10/25/24 TherAct NuStep L6 x 8 min for ROM and strengthening Knee extension 10# 3x10; 1st set RLE only; last 2 sets bil concentric; RLE only eccentric Hamstring curl 15# 3x10; RLE only Sit to/from stand 2x10 with 12# KB Standing hip abduction L3 band 2x10 bil Standing hip extension L3 band 2x10 bil Seated SLR 2x10 on Rt; 3 sec hold     10/18/24 TherAct NuStep UE/LEs x 8 min; L5, seat 10 for ROM and strength Knee extension 10# 3x10; bil concentric; RLE only eccentric Hamstring curl 15# 3x10; RLE only Leg press bil 81# 3x10; then RLE only 31# 3x10 Sit to/from stand 2x5; from slightly elevated mat table    PATIENT EDUCATION:  Education details: HEP Person educated: Patient Education method: Programmer, Multimedia, Facilities Manager, Actor cues, Verbal cues, and Handouts Education comprehension: verbalized understanding, returned demonstration, and verbal cues required  HOME EXERCISE PROGRAM: Access Code: [email or web address] URL: [email or web address] Date: 09/27/2024 Prepared by: Burnard Meth  Exercises - Supine Heel Slide  - 2 x daily - 7 x weekly - 2 sets - 10 reps - Supine Quad Set  - 2 x daily - 7 x weekly - 2 sets - 10 reps - 5 hold - Long Sitting Quad Set with Towel Roll Under Heel  - 2 x daily - 7 x weekly - 2 sets - 10 reps - 5 hold - Supine Active Straight Leg Raise  - 2 x daily - 7 x weekly - 32 sets - 10 reps - Supine Bridge  - 2 x daily - 7 x weekly - 2 sets - 10 reps - Seated Long Arc Quad  - 2 x daily - 7 x weekly - 2 sets - 10 reps - Standing Heel Raise with Support  - 2 x daily - 7 x weekly - 2 sets - 10 reps - Standing  Knee Flexion with Counter Support  - 2  x daily - 7 x weekly - 2 sets - 10 reps - Mini Squat with Counter Support  - 2 x daily - 7 x weekly - 2 sets - 10 reps  ASSESSMENT:  CLINICAL IMPRESSION: Pt may continue to benefit from progressive balance improvements from static to dynamic balance.   Treatment limited due to arrival time due to other MD visit running late.   OBJECTIVE IMPAIRMENTS: decreased balance, decreased mobility, difficulty walking, decreased ROM, decreased strength, increased edema, impaired flexibility, and pain.   ACTIVITY LIMITATIONS: standing, squatting, sleeping, stairs, and transfers  PARTICIPATION LIMITATIONS: meal prep, driving, and community activity  PERSONAL FACTORS: None are also affecting patient's functional outcome.   REHAB POTENTIAL: Good  CLINICAL DECISION MAKING: Stable/uncomplicated  EVALUATION COMPLEXITY: Moderate   GOALS: Goals reviewed with patient? Yes  SHORT TERM GOALS: Target date: 10/19/2024   Patient to be independent with HEP. Baseline: Goal status: MET 10/25/24  2.  Decrease pain by 1 level. Baseline:  Goal status: PARTIALLY MET 10/25/24  LONG TERM GOALS: Target date: 12/07/2024  10 weeks    Patient to be independent with self progressive HEP at discharge. Baseline:  Goal status: on going 11/10/2024  2.  Decrease max pain to 2 out of 10 or less with all activities. Baseline:  Goal status:  on going 11/10/2024  3.  Patient able to ambulate short community distances without assistive device and good ambulation pattern. Baseline:  Goal status:  on going 11/10/2024  4.  Increase active range of motion to 0>115 or greater at discharge. Baseline:  Goal status:  on going 11/10/2024  5.  Increase strength by half a grade to 1 full grade in affected lower extremity to assist with all ADLs. Baseline:  Goal status:  on going 11/10/2024  6.  Increase score of PSFS by 3 points or more to indicate measurable difference. Baseline:  Goal  status:  on going 11/10/2024   PLAN:  PT FREQUENCY: 2x/week  PT DURATION: 10 weeks  PLANNED INTERVENTIONS: 97164- PT Re-evaluation, 97110-Therapeutic exercises, 97530- Therapeutic activity, 97112- Neuromuscular re-education, 97535- Self Care, 02859- Manual therapy, [phone]- Gait training, [phone]- Aquatic Therapy, [phone]- Electrical stimulation (unattended), 97016- Vasopneumatic device, 20560 (1-2 muscles), 20561 (3+ muscles)- Dry Needling, Patient/Family education, Balance training, Stair training, Joint mobilization, Scar mobilization, DME instructions, Cryotherapy, and Moist heat  PLAN FOR NEXT SESSION:  balance improvements.   Ozell Silvan, PT, DPT, OCS, ATC 11/10/24  1:55 PM    "

## 2024-11-12 LAB — HM MAMMOGRAPHY

## 2024-11-16 ENCOUNTER — Encounter: Admitting: Rehabilitative and Restorative Service Providers"

## 2024-11-18 ENCOUNTER — Encounter: Admitting: Rehabilitative and Restorative Service Providers"

## 2024-11-22 ENCOUNTER — Ambulatory Visit: Admitting: Cardiology

## 2024-12-03 ENCOUNTER — Ambulatory Visit: Admitting: Family Medicine

## 2025-03-10 ENCOUNTER — Ambulatory Visit: Admitting: Rheumatology

## 2025-04-06 ENCOUNTER — Ambulatory Visit
